# Patient Record
Sex: Male | Born: 1950 | Hispanic: No | Smoking: Never smoker
Health system: Southern US, Community
[De-identification: ages and names within clinical notes are randomized; demographics above are authoritative.]

## PROBLEM LIST (undated history)

## (undated) DIAGNOSIS — G5600 Carpal tunnel syndrome, unspecified upper limb: Secondary | ICD-10-CM

## (undated) DIAGNOSIS — R410 Disorientation, unspecified: Secondary | ICD-10-CM

## (undated) DIAGNOSIS — G35 Multiple sclerosis: Secondary | ICD-10-CM

## (undated) DIAGNOSIS — S24103A Unspecified injury at T7-T10 level of thoracic spinal cord, initial encounter: Secondary | ICD-10-CM

## (undated) DIAGNOSIS — D72819 Decreased white blood cell count, unspecified: Secondary | ICD-10-CM

## (undated) DIAGNOSIS — R000 Tachycardia, unspecified: Secondary | ICD-10-CM

## (undated) DIAGNOSIS — N200 Calculus of kidney: Secondary | ICD-10-CM

## (undated) DIAGNOSIS — I10 Essential (primary) hypertension: Secondary | ICD-10-CM

## (undated) DIAGNOSIS — E7800 Pure hypercholesterolemia, unspecified: Secondary | ICD-10-CM

## (undated) DIAGNOSIS — R269 Unspecified abnormalities of gait and mobility: Secondary | ICD-10-CM

## (undated) DIAGNOSIS — R41841 Cognitive communication deficit: Secondary | ICD-10-CM

## (undated) HISTORY — PX: LUMBAR LAMINECTOMY: SHX95

## (undated) HISTORY — PX: BACK SURGERY: SHX140

## (undated) HISTORY — DX: Cognitive communication deficit: R41.841

## (undated) HISTORY — DX: Tachycardia, unspecified: R00.0

## (undated) HISTORY — PX: COLONOSCOPY: SHX174

## (undated) HISTORY — DX: Carpal tunnel syndrome, unspecified upper limb: G56.00

## (undated) HISTORY — DX: Decreased white blood cell count, unspecified: D72.819

## (undated) HISTORY — DX: Pure hypercholesterolemia, unspecified: E78.00

## (undated) HISTORY — DX: Unspecified injury at T7-t10 level of thoracic spinal cord, initial encounter: S24.103A

---

## 1999-09-19 ENCOUNTER — Observation Stay (HOSPITAL_COMMUNITY): Admission: RE | Admit: 1999-09-19 | Discharge: 1999-09-20 | Payer: Self-pay | Admitting: Pediatrics

## 1999-09-19 ENCOUNTER — Encounter: Payer: Self-pay | Admitting: Pediatrics

## 1999-10-31 ENCOUNTER — Ambulatory Visit (HOSPITAL_COMMUNITY): Admission: RE | Admit: 1999-10-31 | Discharge: 1999-10-31 | Payer: Self-pay | Admitting: Pediatrics

## 1999-10-31 ENCOUNTER — Encounter: Payer: Self-pay | Admitting: Pediatrics

## 1999-12-15 ENCOUNTER — Encounter: Admission: RE | Admit: 1999-12-15 | Discharge: 1999-12-31 | Payer: Self-pay | Admitting: *Deleted

## 2000-07-14 ENCOUNTER — Emergency Department (HOSPITAL_COMMUNITY): Admission: EM | Admit: 2000-07-14 | Discharge: 2000-07-14 | Payer: Self-pay | Admitting: Emergency Medicine

## 2000-07-21 ENCOUNTER — Encounter: Admission: RE | Admit: 2000-07-21 | Discharge: 2000-10-19 | Payer: Self-pay | Admitting: *Deleted

## 2001-03-08 ENCOUNTER — Encounter: Admission: RE | Admit: 2001-03-08 | Discharge: 2001-06-06 | Payer: Self-pay | Admitting: Psychiatry

## 2001-08-22 ENCOUNTER — Encounter: Admission: RE | Admit: 2001-08-22 | Discharge: 2001-11-20 | Payer: Self-pay | Admitting: Psychiatry

## 2003-01-18 ENCOUNTER — Ambulatory Visit (HOSPITAL_COMMUNITY): Admission: RE | Admit: 2003-01-18 | Discharge: 2003-01-18 | Payer: Self-pay | Admitting: Psychiatry

## 2003-01-18 ENCOUNTER — Encounter: Payer: Self-pay | Admitting: Psychiatry

## 2003-04-05 ENCOUNTER — Encounter: Admission: RE | Admit: 2003-04-05 | Discharge: 2003-04-05 | Payer: Self-pay | Admitting: *Deleted

## 2004-05-12 ENCOUNTER — Encounter: Admission: RE | Admit: 2004-05-12 | Discharge: 2004-08-10 | Payer: Self-pay | Admitting: Neurology

## 2007-01-14 ENCOUNTER — Encounter: Admission: RE | Admit: 2007-01-14 | Discharge: 2007-01-14 | Payer: Self-pay | Admitting: Family Medicine

## 2010-06-13 ENCOUNTER — Encounter
Admission: RE | Admit: 2010-06-13 | Discharge: 2010-07-01 | Payer: Self-pay | Source: Home / Self Care | Attending: Family Medicine | Admitting: Family Medicine

## 2010-07-03 ENCOUNTER — Ambulatory Visit: Payer: Medicare Other | Attending: Family Medicine | Admitting: Rehabilitative and Restorative Service Providers"

## 2010-07-03 DIAGNOSIS — R269 Unspecified abnormalities of gait and mobility: Secondary | ICD-10-CM | POA: Insufficient documentation

## 2010-07-03 DIAGNOSIS — M2420 Disorder of ligament, unspecified site: Secondary | ICD-10-CM | POA: Insufficient documentation

## 2010-07-03 DIAGNOSIS — M629 Disorder of muscle, unspecified: Secondary | ICD-10-CM | POA: Insufficient documentation

## 2010-07-03 DIAGNOSIS — IMO0001 Reserved for inherently not codable concepts without codable children: Secondary | ICD-10-CM | POA: Insufficient documentation

## 2010-07-03 DIAGNOSIS — M6281 Muscle weakness (generalized): Secondary | ICD-10-CM | POA: Insufficient documentation

## 2010-07-07 ENCOUNTER — Ambulatory Visit: Payer: Medicare Other | Admitting: Rehabilitative and Restorative Service Providers"

## 2010-07-10 ENCOUNTER — Ambulatory Visit: Payer: Medicare Other | Admitting: Rehabilitative and Restorative Service Providers"

## 2010-07-14 ENCOUNTER — Ambulatory Visit: Payer: Medicare Other | Admitting: Rehabilitative and Restorative Service Providers"

## 2010-07-17 ENCOUNTER — Ambulatory Visit: Payer: Medicare Other | Admitting: Rehabilitative and Restorative Service Providers"

## 2010-07-22 ENCOUNTER — Ambulatory Visit: Payer: Medicare Other | Admitting: Rehabilitative and Restorative Service Providers"

## 2010-07-25 ENCOUNTER — Ambulatory Visit: Payer: Medicare Other | Admitting: Rehabilitative and Restorative Service Providers"

## 2010-07-31 ENCOUNTER — Ambulatory Visit: Payer: Medicare Other | Attending: Family Medicine | Admitting: Rehabilitative and Restorative Service Providers"

## 2010-07-31 DIAGNOSIS — IMO0001 Reserved for inherently not codable concepts without codable children: Secondary | ICD-10-CM | POA: Insufficient documentation

## 2010-07-31 DIAGNOSIS — M6281 Muscle weakness (generalized): Secondary | ICD-10-CM | POA: Insufficient documentation

## 2010-07-31 DIAGNOSIS — M2420 Disorder of ligament, unspecified site: Secondary | ICD-10-CM | POA: Insufficient documentation

## 2010-07-31 DIAGNOSIS — R269 Unspecified abnormalities of gait and mobility: Secondary | ICD-10-CM | POA: Insufficient documentation

## 2010-07-31 DIAGNOSIS — M629 Disorder of muscle, unspecified: Secondary | ICD-10-CM | POA: Insufficient documentation

## 2010-08-26 ENCOUNTER — Ambulatory Visit: Payer: Medicare Other | Admitting: Occupational Therapy

## 2010-08-28 ENCOUNTER — Encounter: Payer: Medicare Other | Admitting: Occupational Therapy

## 2010-08-28 ENCOUNTER — Ambulatory Visit: Payer: Medicare Other | Admitting: Rehabilitative and Restorative Service Providers"

## 2010-10-17 NOTE — H&P (Signed)
[place]. [facility]  Patient:    Larry [name], Larry [name]                      MRN: [id number] Adm. Date:  [id number] Attending:  Mick Sell CC:         Ammie Dalton, M.D.                         History and Physical  DATE OF BIRTH:  Jan 20, 1951.  CHIEF COMPLAINT:  Spastic paraparesis.  HISTORY OF PRESENT CONDITION:  Patient is a 60 year old married African-American gentleman with a diagnosis of multiple sclerosis made in [place] in 1996.   I do not have all the information associated with that.  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  In the aftermath of that, there was significant edema of the cord  which was present at least two days later on serial MRI scans.  MRI scans in 1996 showed evidence of high white matter signal in the frontal regions that was consistent with his paraparesis.  Patient also had a visual evoked potential, July 23, 1995, showing abnormal filling in the anterior pathway n the left side.  Patient has had problems with a neurogenic bladder.  We were unaware of any significant change in his eye, despite visual evoked potential study.  Patient is experiencing progressive difficulty with his gait, stiffness and weakness, beginning in December of 2000.  He has also complained of low back pain and in particularly, pain in his left hip.  He has difficulty lying on that side. The patient has not had incontinence.  He does have erectile dysfunction.  PAST SURGICAL HISTORY:  Lumbar laminectomy, as mentioned above.  CURRENT MEDICATIONS: 1. Avonex 30 mcg IM once per week. 2. Neurontin 400 mg p.o. t.i.d. 3. Baclofen 20 mg in the morning, afternoon and evening; 40 mg at nighttime. 4. Ultram 50 mg two in the morning. 5. Cartia XT 240 mg in the morning. 6. Amitriptyline 25 mg q.h.s.  ALLERGIES:  None known.  REVIEW OF SYSTEMS:   Positive for pain in his hips, numbness in his legs, occasional numbness in the right hand, occasional headaches.  No visual or hearing changes. No swallowing or speech difficulty.  No weakness in his upper extremities.  No intercurrent infections in the head and neck, lungs, GI or GU.  No rash, anemia, bruisability, lymphadenopathy.  Positive for diabetes.  Negative for thyroid disease.  Positive for hypertension.  Review of systems is otherwise negative.  SOCIAL HISTORY:  The patient is married.  He does not use tobacco or alcohol products.  PHYSICAL EXAMINATION:  GENERAL:  Pleasant 60 year old African-American gentleman in no apparent distress.  VITAL SIGNS:  Blood pressure 160/100, resting pulse 90, respirations 18, temperature 97.4.  O2 saturation 100%.  HEENT:  He has wax in the right ear; normal left ear.  No signs of infection in the oropharynx.  NECK:  Supple neck.  Full range of motion.  No cranial or cervical bruits.  LUNGS:  Clear.  HEART:  No murmurs.  ABDOMEN:  Soft.  Bowel sounds normal.  EXTREMITIES:  No edema.  Some spasticity in his legs and knees and tight heel cords.  NEUROLOGIC:  Mental status:  Patient was awake, alert and appropriate.  No dysphagia or dyspraxia.  Cranial nerve examination:  Round, reactive pupils. Fundi normal.  Visual fields full to  double simultaneous stimuli, okay and responses equal.  Symmetric facial strength.  Midline tongue and uvula.  Motor examination: Normal strength in the upper extremities.  Normal tone.  Good fine motor movements. Strength 4+/5 in his knees flexors, 5/5 in his knee extensors and foot plantarflexors, 2/5 dorsiflexors of his ankles and can barely wiggle his toes.   Sensation shows no level.  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 Cerebellar:  He has normal function in his upper extremities and spasticity prevents good movements in the lower extremities.  Gait:  Diplegic,  spastic, broad-based shuffling.  Deep tendon reflexes brisk, without clonus, greater in he lower extremities than in the upper extremities.  He has bilateral extensor plantar responses.  IMPRESSION: 1. 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  Patient carries a diagnosis of multiple    sclerosis but he has also had evidence of cord edema from critical thoracic    spinal stenosis.  I am not certain that I know the etiology of his dysfunction. 2. Hypertension without congestive heart failure, 404.10. 3. Non-insulin-dependent diabetes mellitus.  PLAN:  Patient is admitted for intrathecal baclofen trial; we will also evaluate the information sent to Dr. Ammie Dalton by [facility] and determine whether or not further workup is indicated and if a definite diagnosis of MS can be made.  The presence or absence of MS would not necessarily effect the use of the baclofen pump.  I have spoken with the patient and his wife at length to describe the means by which the pump would be placed, the potential mechanical problems with the pump and tubing, the need for a period of lack of activity for six weeks after we place the pump and the need for physical therapy, once we have demonstrated improvement in spasticity with the pump when the patient is more mobile and the unit is well-seated in place.  I have also described the possibilities of bowel and bladder dysfunction and in  particular erectile dysfunction.  The family voices understanding of these.  On completion of the baclofen trial, the patient will be discharged tomorrow and plan for neurosurgical evaluation will be made.  I suspect that we will be performing an MR of the back in order to be certain that there is adequate space in order to place the catheter.  The presence of previous back surgery makes this somewhat more problematic, although I do not think that it would prevent [place] from using the pump.  We may not be able to  place it as high as possible because of the previous stenosis at the mid-thoracic level.  Given the   patient is having significant problems in his legs and not his arms, this may be fine.  We will first wait to see if he responds to baclofen and to what degree e responds.  Because of the difficulty of the spinal procedure, it is my hope that we are not going to have to repeat this procedure a second time but protocol would  dictate that that should be done. DD:  09/19/99 TD:  09/19/99 Job: [id number]

## 2010-10-17 NOTE — Procedures (Signed)
[place]. [facility]  Patient:    Larry [name], Larry [name]                      MRN: [id number] Proc. Date: 09/19/99 Adm. Date:  [id number] Attending:  Mick Sell CC:         Ammie Dalton, M.D.                           Procedure Report  DATE OF BIRTH:  08-22-1950.  INDICATION:  Three attempts were made to perform lumbar puncture at bedside on r. Fischler-one in the lateral recumbent, two with him sitting up all at different levels.  We were unable to obtain spinal fluid.  DESCRIPTION OF PROCEDURE:  We brought him down to the fluoroscopy suite and Dr.  Arlice Colt placed the needle at what I believe is L2/3.  Then 10 cc of clear, colorless fluid was obtained from his spinal fluid and sent to the laboratory for studies.  Then 50 mcg of baclofen was instilled and mixed on five occasions by barbotage.  In the aftermath, the patient complained of some tingling in his hands with some headache.  He will be evaluated over the next eight hours to determine whether r not there is response to intrathecal baclofen in terms of diminishing his spasticity.  His headache will be treated with Tylenol.  We will keep him on bedrest until he has to be up in part because of his headache. DD:  09/19/99 TD:  09/20/99 Job: [id number]

## 2010-10-17 NOTE — Discharge Summary (Signed)
[place]. [facility]  Patient:    Larry [name], Larry [name]                      MRN: [id number] Adm. Date:  [id number] Disc. Date: 09/20/99 Attending:  Mick Sell CC:         Ammie Dalton, M.D.                           Discharge Summary  FINAL DIAGNOSES: 1. 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 2. Possible multiple sclerosis, 340. 3. Hypertension without congestive heart failure, 404.10. 4. Non-insulin-dependent diabetes mellitus without complications.  HOSPITAL COURSE:  The patient was admitted for intrathecal baclofen trial. I had difficulty performing lumbar puncture on the patient despite sitting him up. This was done under fluoroscopy, and there was some difficulty there as well. Fluid did drain but very very slowing.  There was 10 cc of clear, colorless fluid obtained. Protein 26, glucose 87, 4 white blood cells, rare lymphs were seen.  IgG albumin ratio and oligoclonal banding is pending.  There was 50 mcg of baclofen instilled, and Ashworth scores were conducted at 8:15 in the morning, 11:30, 12:30, 2:30, 4:30, and 6:40.  The patient had no evidence of spasticity in the upper extremities. In the lower extremities, he had significant spasticity, right leg more so than left, knee and ankle greater than all others.  At baseline, the patient had moderate spasticity, hip abduction and adduction marked hip flexion and knee flexion, knee extension, ankle dorsiflexion, and moderate plantar flexion. On the right side, there was marked hip adduction, abduction, flexion, and knee flexion, and severe knee extension, ankle dorsiflexion, and plantar flexion. In all cases, there was some ability to move his limbs. He did not have absolute rigidity to movement.  At four hours after the bolus which is the maximum effect of the medication, there was no increase in tone in the left lower extremity with the exception of ankle  dorsiflexion which showed slight increase in tone. The average Ashworth score dropped from 2.57 in the lower extremity to 1.4. On the right lower extremity, there was mild spasticity in hip abduction, flexion, knee extension, knee flexion, ankle dorsiflexion, and normal tone in the hip adduction and plantar flexion. Ashworth scores dropped from 3.43 down to 1.71. Six hours after, the scores began to climb slightly again more so on the right side than on the left. By eight hours, the patient was slightly better in both the right and left leg than he had been at baseline but was very close with Ashworth score 2.14 in the left leg and 3.0 in the right. In addition, the patient seemed to have his greatest amount of spasms two hours into the trial. Right leg was always more effective than the left leg. He had significant improvement in his pain in his hip. Overall, he tolerated this very well.  The physical therapists comments were that the patient must make conscious effort but only achieves minimal flexion and complains of increased pain in his hips at baseline. He had hip pain with flexion lying or standing. He reports that spasticity hinders his ADL performance. Two hours after the bolus, he had increased spasms in the right lower extremity where the quads were visibly contracting making the right knee more difficult to move presenting the appearance of increased tone. Four hours after the bolus, the patient was able to ambulate with markedly decreased stiffness  in his legs and the ability to achieve some hip and knee flexion and occasionally heel contact, greater on the left with conscious effort. At the peak time, left lower extremity tone was basically normal. Right lower extremity tone was mildly increased. Both markedly decreased from pre-bolus assessment.  IMPRESSION:  This was a positive test demonstrating response to intrathecal baclofen.  DISPOSITION:  At this time, the patient  will be discharged home. He is back to his baseline. We have restarted his home medications. We will research his evaluation at [facility] and decide whether he has a thoracic myelopathy from spondylosis, multiple sclerosis, or both problems. The patient will then be presented with options for further treatment. One concern that I have is that there was narrowing in the T9 to T12 range. This has been decompressed, but we will be needing to place a catheter up in this region which could be problematic for the surgeon. This will need to be carefully explored.  I also explained to the patient the complications of malfunction of the system which would cause instant return of his spasticity, problems with constipation, and possibly urinary difficulties, as well as sexual dysfunction. His wife expressed the opinion that she would rather have him walking than worry as much about the sexual dysfunction.  I appreciate the opportunity to see this patient. He is discharged in improved condition. We will follow up as noted above. I have discussed this case with D. Catalina Antigua, M.D. and also with Ammie Dalton, M.D., and he will be seen in follow-up after we have had an opportunity to review his old records. An MRI scan of this head and back may be necessary in addition to the spinal fluid obtained.DD:  09/20/99 TD:  09/21/99 Job: [id number]

## 2011-02-15 ENCOUNTER — Emergency Department (HOSPITAL_COMMUNITY): Payer: Medicare Other

## 2011-02-15 ENCOUNTER — Inpatient Hospital Stay (HOSPITAL_COMMUNITY)
Admission: EM | Admit: 2011-02-15 | Discharge: 2011-02-18 | DRG: 558 | Disposition: A | Discharge status: Home Health | Payer: Medicare Other | Attending: Internal Medicine | Admitting: Internal Medicine

## 2011-02-15 DIAGNOSIS — Z79899 Other long term (current) drug therapy: Secondary | ICD-10-CM

## 2011-02-15 DIAGNOSIS — E876 Hypokalemia: Secondary | ICD-10-CM | POA: Diagnosis present

## 2011-02-15 DIAGNOSIS — G35 Multiple sclerosis: Secondary | ICD-10-CM | POA: Diagnosis present

## 2011-02-15 DIAGNOSIS — E119 Type 2 diabetes mellitus without complications: Secondary | ICD-10-CM | POA: Diagnosis present

## 2011-02-15 DIAGNOSIS — R404 Transient alteration of awareness: Secondary | ICD-10-CM | POA: Diagnosis present

## 2011-02-15 DIAGNOSIS — S42409A Unspecified fracture of lower end of unspecified humerus, initial encounter for closed fracture: Secondary | ICD-10-CM | POA: Diagnosis present

## 2011-02-15 DIAGNOSIS — W19XXXA Unspecified fall, initial encounter: Secondary | ICD-10-CM | POA: Diagnosis present

## 2011-02-15 DIAGNOSIS — Z23 Encounter for immunization: Secondary | ICD-10-CM

## 2011-02-15 DIAGNOSIS — S0003XA Contusion of scalp, initial encounter: Secondary | ICD-10-CM | POA: Diagnosis present

## 2011-02-15 DIAGNOSIS — G8220 Paraplegia, unspecified: Secondary | ICD-10-CM | POA: Diagnosis present

## 2011-02-15 DIAGNOSIS — I10 Essential (primary) hypertension: Secondary | ICD-10-CM | POA: Diagnosis present

## 2011-02-15 DIAGNOSIS — N329 Bladder disorder, unspecified: Secondary | ICD-10-CM | POA: Diagnosis present

## 2011-02-15 DIAGNOSIS — M6282 Rhabdomyolysis: Principal | ICD-10-CM | POA: Diagnosis present

## 2011-02-15 LAB — CK TOTAL AND CKMB (NOT AT ARMC)
Relative Index: 1 (ref 0.0–2.5)
Total CK: 3190 U/L — ABNORMAL HIGH (ref 7–232)

## 2011-02-15 LAB — COMPREHENSIVE METABOLIC PANEL
AST: 70 U/L — ABNORMAL HIGH (ref 0–37)
Albumin: 4.9 g/dL (ref 3.5–5.2)
Chloride: 96 mEq/L (ref 96–112)
Creatinine, Ser: 0.86 mg/dL (ref 0.50–1.35)
Potassium: 3.2 mEq/L — ABNORMAL LOW (ref 3.5–5.1)
Total Bilirubin: 0.4 mg/dL (ref 0.3–1.2)
Total Protein: 7.6 g/dL (ref 6.0–8.3)

## 2011-02-15 LAB — URINALYSIS, ROUTINE W REFLEX MICROSCOPIC
Leukocytes, UA: NEGATIVE
Nitrite: NEGATIVE
Specific Gravity, Urine: 1.022 (ref 1.005–1.030)
Urobilinogen, UA: 0.2 mg/dL (ref 0.0–1.0)
pH: 5 (ref 5.0–8.0)

## 2011-02-15 LAB — CBC
HCT: 38.1 % — ABNORMAL LOW (ref 39.0–52.0)
MCH: 30.5 pg (ref 26.0–34.0)
MCV: 89.4 fL (ref 78.0–100.0)
RDW: 13.8 % (ref 11.5–15.5)
WBC: 9.5 10*3/uL (ref 4.0–10.5)

## 2011-02-15 LAB — GLUCOSE, CAPILLARY: Glucose-Capillary: 118 mg/dL — ABNORMAL HIGH (ref 70–99)

## 2011-02-15 LAB — DIFFERENTIAL
Eosinophils Relative: 0 % (ref 0–5)
Lymphocytes Relative: 5 % — ABNORMAL LOW (ref 12–46)
Lymphs Abs: 0.5 10*3/uL — ABNORMAL LOW (ref 0.7–4.0)
Monocytes Absolute: 0.4 10*3/uL (ref 0.1–1.0)
Monocytes Relative: 4 % (ref 3–12)

## 2011-02-15 LAB — ETHANOL: Alcohol, Ethyl (B): <11 mg/dL (ref 0–11)

## 2011-02-15 LAB — POCT I-STAT TROPONIN I: Troponin i, poc: 0.08 ng/mL (ref 0.00–0.08)

## 2011-02-15 LAB — TROPONIN I: Troponin I: <0.3 ng/mL (ref <0.30)

## 2011-02-15 LAB — URINE MICROSCOPIC-ADD ON

## 2011-02-16 ENCOUNTER — Other Ambulatory Visit (HOSPITAL_COMMUNITY): Payer: Medicare Other

## 2011-02-16 ENCOUNTER — Inpatient Hospital Stay (HOSPITAL_COMMUNITY): Payer: Medicare Other

## 2011-02-16 DIAGNOSIS — I517 Cardiomegaly: Secondary | ICD-10-CM

## 2011-02-16 DIAGNOSIS — R7989 Other specified abnormal findings of blood chemistry: Secondary | ICD-10-CM

## 2011-02-16 LAB — LIPID PANEL
HDL: 55 mg/dL (ref >39)
LDL Cholesterol: 83 mg/dL (ref 0–99)

## 2011-02-16 LAB — CARDIAC PANEL(CRET KIN+CKTOT+MB+TROPI)
CK, MB: 33.4 ng/mL (ref 0.3–4.0)
CK, MB: 17.8 ng/mL (ref 0.3–4.0)
Relative Index: 0.6 (ref 0.0–2.5)
Troponin I: 0.31 ng/mL (ref <0.30)
Troponin I: <0.3 ng/mL (ref <0.30)

## 2011-02-16 LAB — COMPREHENSIVE METABOLIC PANEL
ALT: 23 U/L (ref 0–53)
AST: 99 U/L — ABNORMAL HIGH (ref 0–37)
Albumin: 4.4 g/dL (ref 3.5–5.2)
Alkaline Phosphatase: 76 U/L (ref 39–117)
Calcium: 9.7 mg/dL (ref 8.4–10.5)
Glucose, Bld: 132 mg/dL — ABNORMAL HIGH (ref 70–99)
Potassium: 3.1 mEq/L — ABNORMAL LOW (ref 3.5–5.1)
Sodium: 140 mEq/L (ref 135–145)
Total Protein: 7 g/dL (ref 6.0–8.3)

## 2011-02-16 LAB — GLUCOSE, CAPILLARY
Glucose-Capillary: 128 mg/dL — ABNORMAL HIGH (ref 70–99)
Glucose-Capillary: 144 mg/dL — ABNORMAL HIGH (ref 70–99)
Glucose-Capillary: 149 mg/dL — ABNORMAL HIGH (ref 70–99)
Glucose-Capillary: 178 mg/dL — ABNORMAL HIGH (ref 70–99)

## 2011-02-16 LAB — CBC
Hemoglobin: 12.6 g/dL — ABNORMAL LOW (ref 13.0–17.0)
MCH: 31 pg (ref 26.0–34.0)
MCHC: 34.7 g/dL (ref 30.0–36.0)
Platelets: 327 10*3/uL (ref 150–400)
RDW: 13.7 % (ref 11.5–15.5)

## 2011-02-16 LAB — APTT: aPTT: 31 seconds (ref 24–37)

## 2011-02-16 LAB — PROTIME-INR
INR: 1.01 (ref 0.00–1.49)
Prothrombin Time: 13.5 seconds (ref 11.6–15.2)

## 2011-02-17 ENCOUNTER — Inpatient Hospital Stay (HOSPITAL_COMMUNITY): Payer: Medicare Other

## 2011-02-17 LAB — GLUCOSE, CAPILLARY

## 2011-02-17 LAB — CBC
Hemoglobin: 11.4 g/dL — ABNORMAL LOW (ref 13.0–17.0)
MCH: 30.6 pg (ref 26.0–34.0)
MCV: 92.2 fL (ref 78.0–100.0)
RBC: 3.72 MIL/uL — ABNORMAL LOW (ref 4.22–5.81)

## 2011-02-17 LAB — BASIC METABOLIC PANEL
CO2: 26 mEq/L (ref 19–32)
Calcium: 8.5 mg/dL (ref 8.4–10.5)
Creatinine, Ser: 0.76 mg/dL (ref 0.50–1.35)
Glucose, Bld: 109 mg/dL — ABNORMAL HIGH (ref 70–99)

## 2011-02-17 NOTE — Procedures (Signed)
REFERRING PHYSICIAN:  Eduard Clos, MD  HISTORY:  A 60 year old male with confusion and history of stroke.  MEDICATIONS:  Insulin, potassium, Ultram, Zanaflex, Neurontin, Lioresal, Enablex, Elavil, Zofran and acetaminophen.  CONDITIONS OF RECORDING:  This is a 16-channel EEG carried out with the patient in the awake state.  DESCRIPTION:  The waking background activity consists of a low-voltage symmetrical fairly well-organized 10 Hz alpha activity seen from the parieto-occipital and posterotemporal regions.  Low-voltage fast activity poorly organized was seen and during at times superimposed on more posterior rhythms.  A mixture of theta and alpha rhythm was seen from the central and temporal regions.  The patient does not drowse asleep.  Hypoventilation and intermittent photic stimulation were both performed, both failed to elicit any change in the tracing.  IMPRESSION:  This is a normal awake EEG.  COMMENT:  An EEG, the patient is sleep deprived to elicit drowse and light sleep, maybe desirable to further elicit possible seizure disorder.          ______________________________ Thana Farr, MD    [id number] D:  02/17/2011 18:30:20  T:  02/17/2011 23:22:46  Job #:  [id number]

## 2011-02-18 LAB — BASIC METABOLIC PANEL
BUN: 13 mg/dL (ref 6–23)
Creatinine, Ser: 0.83 mg/dL (ref 0.50–1.35)
GFR calc Af Amer: >60 mL/min (ref >60)
GFR calc non Af Amer: >60 mL/min (ref >60)

## 2011-02-18 LAB — GLUCOSE, CAPILLARY

## 2011-02-19 NOTE — Consult Note (Addendum)
Larry [name], KLIMOWICZ NO.:  [id number]  MEDICAL RECORD NO.:  [id number]  LOCATION:  2021                         FACILITY:  [facility]  PHYSICIAN:  Peter M. Swaziland, M.D.  DATE OF BIRTH:  1950-10-25  DATE OF CONSULTATION:  02/16/2011 DATE OF DISCHARGE:                                CONSULTATION   PRIMARY CARDIOLOGIST:  New.  PRIMARY MEDICAL DOCTOR:  Betti D. Pecola Leisure, MD  CHIEF COMPLAINT:  Fall.  REASON FOR CONSULTATION:  Abnormal cardiac enzymes.  HISTORY OF PRESENT ILLNESS:  Mr. Hillhouse is a 60 year old gentleman with no prior cardiac history, but history of multiple sclerosis and diabetes mellitus who apparently sustained a fall last night.  Mr. Blessinger is not entirely clear of what happened during that episode.  He is lucid and coherent now, but only remember bits and pieces of the events of yesterday.  He thinks he may have been lying in bed when the phone rang, but was unable to get out of bed to answer.  He attempted to crawl downstairs and the next thing he knew EMS had arrived.  He wears at Life Alert button, but does not remember pressing it and questions whether or not he accidently laid on it.  Nevertheless, he was subsequently transferred to [facility] especially in the light of the fact that he was somewhat confused.  CT of the head demonstrated no acute intracranial abnormalities.  Did show left forehead and scalp hematoma and plain films of the elbow demonstrated a nondisplaced transcondylar distal humeral fracture with a probable avulsion-type ulnar fracture. His CKs have been elevated from [phone] with MBs of 32.7 and 33.4 with negative relative indices.  First troponin was negative and next was 0.31, thus prompting cardiology consultation.  EKG demonstrates normal sinus rhythm, frequent PVCs, and occasional PAC.  Telemetry reveals frequent PVCs.  The patient is asymptomatic from a cardiac standpoint and denies any chest pain,  shortness of breath, palpitations, or known syncope.  He is currently receiving normal saline and IV fluid, and is scheduled for an EEG to rule out seizure as an etiology.  PAST MEDICAL HISTORY: 1. Multiple sclerosis with spastic paraparesis. 2. Diabetes mellitus type 2. 3. Hypertension. 4. Status post lumbar laminectomy.  No prior cardiac workup or evaluation.  OUTPATIENT MEDICATIONS:  The full list is pending as this daughter plans to bring his medications this morning, but includes: 1. Gabapentin 800 mg b.i.d. 2. Amitriptyline 25 mg daily. 3. Metformin 1000 mg b.i.d. 4. Tizanidine 4 mg 2 tablets nightly. 5. Baclofen 20 mg b.i.d. 6. Solifenacin 10 mg daily. 7. Tramadol 50 mg 2 tablets nightly. 8. Avonex 30 mcg IM q.7 days. 9. Hydrochlorothiazide 25 mg daily. 10.Pioglitazone 30 mg daily.  INPATIENT MEDICATIONS:  Due to the pending med rec, only include sliding scale insulin and IV fluids at this time.  ALLERGIES:  No known drug allergies.  SOCIAL HISTORY:  Mr. Lacko lives alone in [place].  His daughter lives in [place].  He denies any tobacco, alcohol, or drug use.  FAMILY HISTORY:  Positive for diabetes, as well as liver cancer in his father.  REVIEW OF SYSTEMS:  No fevers, chills, nausea, vomiting, or diarrhea. Up  until today, the patient has felt well and even attended a church function on Saturday with no change from his baseline.  He ambulates with 2 canes.  LABORATORY DATA:  WBC 6.4, hemoglobin 12.6, hematocrit 36.3, and platelet count 327.  Sodium 140, potassium 3.1, chloride 99, CO2 of 29, glucose 132, BUN 19, and creatinine 0.80.  LFTs are normal with the exception of AST of 99.  CKs and MBs and troponin as noted above. Alcohol level less than 11.  UA showed 15 ketones, large blood, 100 protein.  STUDIES: 1. Right elbow plain film showed mild irregularity of the trochlea,     could represent normal variant and ossification, although history      of trauma means nondisplaced fracture is not excluded. 2. Elbow plain film on the left shows nondisplaced transcondylar     distal humeral fracture.  Probable avulsion-type ulnar fracture     fragments versus comminution noted posteriorly. 3. CT of the C-spine showed no static evidence of acute injuries to     the C-spine.  Multilevel degenerative changes with central spinal     and foraminal narrowing from C4-T1. 4. Chest x-ray on February 15, 2011, showed low lung volumes. 5. CT of the head on February 15, 2011, showed no acute intracranial     abnormality.  Mild chronic ischemic white matter disease.  Left     forehead and scalp subcutaneous hematoma.  PHYSICAL EXAMINATION:  VITAL SIGNS:  Temperature 98.7, pulse 95, respirations 18, blood pressure 94/59, and pulse ox 99% on room air. GENERAL:  This is a pleasant African American male in no acute distress. HEENT:  Normocephalic and atraumatic with extraocular muscles intact and clear sclerae.  Nares are without discharge. NECK:  Supple without carotid bruits or JVD. HEART:  Auscultation to the heart reveals slightly tachycardic rate, regular rhythm with occasional ectopy.  No obvious murmurs, rubs, or gallops. LUNGS:  Clear to auscultation bilaterally without wheezes, rales, or rhonchi. ABDOMEN:  Soft, nontender, and nondistended.  Positive bowel sounds. EXTREMITIES:  Warm and dry without edema.  He has 2+ pedal pulses bilaterally. NEUROLOGIC:  He is alert and oriented x3 and responds questions appropriately with a normal affect.  Does have bilateral decreased lower extremity strength.  ASSESSMENT/PLAN:  The patient was seen and examined by Dr. Swaziland and myself.  This is a 60 year old gentleman with no prior cardiac history, but a history of multiple sclerosis and diabetes who presents to [facility] with a possible fall last night, the details of which are unclear.  He has sustained a scalp hematoma and humeral  fracture as well as was found to have rhabdomyolysis as a result.  His first troponin was negative and second troponin was 0.31.  CKs and MBs are elevated, but negative relative indices are noted.  We did not feel that this represents acute coronary syndrome at this time.  We will continue to trend.  He does have frequent PVCs on telemetry, but is hypokalemic, so we will replete his potassium.  We will check a 2-D echocardiogram to evaluate his LV function.  If his echo is normal, no further cardiac workup is felt needed at this time.  Thank you for the opportunity to participate in the care of this interesting gentleman.     Ronie Spies, P.A.C.   ______________________________ Peter M. [place]aziland, M.D.    DD/MEDQ  D:  02/16/2011  T:  02/16/2011  Job:  [id number]  Electronically Signed by PETER Swaziland M.D. on 02/19/2011  05:08:10 PM Electronically Signed by Ronie Spies  on 02/26/2011 07:05:30 PM

## 2011-03-01 NOTE — H&P (Signed)
NAMESHAVON, ZENZ NO.:  [id number]  MEDICAL RECORD NO.:  [id number]  LOCATION:  2021                         FACILITY:  [facility]  PHYSICIAN:  Eduard Clos, MDDATE OF BIRTH:  [birth year]  DATE OF ADMISSION:  02/15/2011 DATE OF DISCHARGE:                             HISTORY & PHYSICAL   PRIMARY CARE PHYSICIAN:  Betti D. Pecola Leisure, MD, at [facility].  CHIEF COMPLAINT:  Fall.  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  He was found on the floor, he was trying to crawl to the door, and he also sustained a small hematoma on the left frontal head.  The patient states that he did not lose consciousness but he looked confused.  When he was brought in the ER, he was confused, getting better now.  The patient's daughter, who was at the bedside, also feels that he was much confused when he came.  The patient does have a spastic paraparesis, but he is able to move upper extremities.  He is talking but he is having some difficulty bringing things together.  Denies any chest pain, shortness of breath, nausea, vomiting, abdominal pain, dysuria, discharge, diarrhea.  Had sustained some bruises on both elbows and denies any headache at this time.  Has no visual symptoms.  Denies any difficulty speaking, though he had some difficulty bringing out the words.  Denied any difficulty swallowing.  The patient in the ER had CT head at this time which only shows superficial subcutaneous hematoma on the left frontal head and scalp. With the high CK levels, the patient will be admitted for further workup.  The patient denies any tongue bite or incontinence of urine or any seizure-like activities.  PAST MEDICAL HISTORY: 1. 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 2. History of diabetes mellitus, type 2.  PAST SURGICAL HISTORY:  Spine surgery.  MEDICATIONS PRIOR TO ADMISSION:  Gabapentin, metformin, tizanidine, and doses are to be verified.  ALLERGIES:  No known drug allergies.  FAMILY HISTORY:  Positive for liver cancer in his dad and diabetes.  SOCIAL HISTORY:  The patient lives alone.  Denies smoking cigarettes, drinking alcohol, or using illegal drugs.  He is separated at this time.  REVIEW OF SYSTEMS:  As per the history of presenting illness, nothing else significant.  PHYSICAL EXAMINATION:  VITAL SIGNS:  The patient was examined at bedside, not in acute distress. VITAL SIGNS:  Blood pressure 129/79, pulse is 96 per minute, temperature 99.6, respirations 18 per minute, O2 sat 99%. HEENT:  There is a smaller left periorbital hematoma and also around the frontal head area.  There is no facial asymmetry.  Tongue is midline. The patient is able to count in both eyes and no neck rigidity noted. No discharge from ears, eyes, nose, or mouth. CHEST:  Bilateral air entry present.  No rhonchi, no crepitation. HEART:  S1 and S2 heard. ABDOMEN:  Soft, nontender.  Bowel sounds heard. CENTRAL NERVOUS SYSTEM:  The patient is alert, awake.  At this time, he is oriented x3 but appears a little bit confused.  He is able to move both upper extremities.  He is unable to move his lower extremities as he is a known case of spastic paraparesis from his multiple sclerosis. He is able to wiggle his toes. EXTREMITIES:  There are some bruises on both elbows and also in the right knee area from the fall.  As explained, there is a small hematoma on the left frontal area of his head.  There is are no acute ischemic changes, cyanosis, or clubbing.  LABORATORY DATA:  EKG has been ordered. A CT head without contrast shows no acute intracranial abnormality, mild chronic ischemic white matter disease, left frontal  and scalp subcutaneous hematoma. Chest x-ray shows low-volume chest. CT C-spine without contrast shows no static evidence of acute injury to the cervical spine, multilevel degenerative changes with central spinal foraminal narrowing from C4 to T1. CBC:  WBCs 9.5, hemoglobin is 13, hematocrit is 38.1, platelets 305, and complete metabolic panel:  Sodium 138, potassium 3.2, chloride 96, carbon dioxide 27, glucose 132, BUN 25, creatinine 0.8, total bilirubin is 0.4, alkaline phosphatase 81, AST 70, ALT 23, total protein 7.6, albumin 4.9, calcium 10.3.  CK is 3190, MB is 32.7, relative index is 1, troponin is less than 0.3, troponin I is 0.08.  Alcohol level less than 11.  UA is negative for nitrite, leukocytes, and ketones, glucose negative, hyaline casts, wbc's 0, rbc's 3-6, large amount of blood.  ASSESSMENT: 1. Delirium. 2. Fall with rhabdomyolysis. 3. Small left frontal hematoma. 4. History of multiple sclerosis with spastic paraparesis. 5. History of diabetes mellitus, type 2. 6. Mild bruises on both elbows from fall.  PLAN: 1. We will admit the patient to telemetry. 2. For his delirium, at this time the patient is getting more well     oriented.  He was confused when he came.  His CK levels are high.     We do not know the exact cause for his delirium.  We will check a     urine drug screen.  The patient may be postictal, for which we are     going to get an EEG and at the same time, we will also get MRI of     the brain.  At the same time, we will also rule out any CVA. 3. Rhabdomyolysis.  We are going to hydrate the patient at this time     with normal saline at 150 mL/hour with some KCl added to the     fluids.  Recheck CK daily in a.m. until it is showing decreasing     trend. 4. We will get a chest x-ray, EKG, urine drug screen and we need to     verify his home medication doses and his medications. 5. Further recommendations as condition evolves.     Eduard Clos, MD     ANK/MEDQ  D:  02/15/2011  T:  02/16/2011  Job:  [id number]  cc:   Jocelyn Lamer D. Pecola Leisure, M.D.  Electronically Signed by Midge Minium MD on 03/01/2011 11:58:56 AM

## 2011-03-05 NOTE — Discharge Summary (Signed)
Larry [name], Larry [name] NO.:  [id number]  MEDICAL RECORD NO.:  [id number]  LOCATION:  2021                         FACILITY:  [facility]  PHYSICIAN:  Lonia Blood, M.D.       DATE OF BIRTH:  Mar 06, 1951  DATE OF ADMISSION:  02/15/2011 DATE OF DISCHARGE:  02/18/2011                              DISCHARGE SUMMARY   DISCHARGE DIAGNOSES: 1. Altered mental status of unclear etiology, resolved.  By the time     of discharge of this patient we could not figure out why he was     confused on admission. 2. Fall with mild rhabdomyolysis - resolved. 3. Multiple sclerosis. 4. Diabetes mellitus type 2.  DISCHARGE MEDICATIONS: 1. Amitriptyline 25 mg daily. 2. Avonex 300 mcg intramuscularly every week. 3. Baclofen 20 mg twice a day. 4. Gabapentin 800 mg twice a day. 5. Metformin 1000 mg twice a day. 6. Multivitamin daily. 7. Actos 30 mg daily. 8. Solifenacin 10 mg daily. 9. Tizanidine 4 mg 2 tablets at bedtime. 10.Tramadol 50 mg 2 tablets at bedtime.  CONDITION ON DISCHARGE:  Larry [name] is discharged in good condition, alert, oriented, no acute distress, able to ambulate around the room using his crutches.  The patient will follow up with his primary care physician Dr. Leilani Able.  PROCEDURE IN THIS ADMISSION: 1. The patient underwent head CT without contrast on February 15, 2011, that showed no acute intracranial abnormality. 2. Cervical spine CT which showed no static evidence of acute injury     of the cervical spine. 3. Right elbow x-ray showing irregularity of the trochlea of unclear     clinical significance. 4. Left elbow x-ray showing nondisplaced distal humeral fracture - the     patient was completely asymptomatic without any pain, full range of     motion - case was discussed with Dr. Shon Baton who agreed that nothing     has to be done unless the patient becomes symptomatic. 5. MRI of the brain which shows no evidence of acute ischemia,     nonspecific  subcortical white matter changes, probably represents     areas of clinically gliosis, chronic microvascular disease. 6. EEG February 17, 2011, showing normal awake EEG.  CONSULTATION THIS ADMISSION:  The patient was seen in consultation by Dr. Peter [place] from Cardiology.  HISTORY AND PHYSICAL:  Refer dictated H and P which was done by Dr. Toniann Fail.  HOSPITAL COURSE:  Larry [name] is a 60 year old gentleman with longstanding multiple sclerosis who was brought from home with confusion.  He also had a fall prior to admission.  He was found to have mild rhabdomyolysis with a total CK of 3190, AST of 70.  The patient's creatinine was 0.8, BUN 25.  For the patient's confused state, he underwent a full workup which included MRI of the brain, EEG, septic workup with urine culture, blood cultures.  None of the above tests revealed any etiology for the patient's altered mental status.  Alcohol level was undetectable and urine drug screen did not return any offenders.  By hospital day #2, Larry [name] was alert and oriented.  He was monitored for 48 more hours without any  signs of confusion.  He was resumed on all of his medications which by the way he has been taking for the past 10 years.  Overall it is unclear why the patient had alteration in sensorium prior to admission and since he remained stable for 72 hours without any return of his confusion.  We deemed that he is stable for discharge home.  Upon admission, the patient had x-rays of both elbows.  On the left elbow x-ray the interpretation of the radiologist that the patient may have a fracture.  Clinically, the patient had absolutely no pain in the elbow, had full range of motion. For this reason no further treatment is recommended unless he becomes symptomatic.     Lonia Blood, M.D.     SL/MEDQ  D:  02/20/2011  T:  02/20/2011  Job:  [id number]  cc:   Jocelyn Lamer D. Pecola Leisure, M.D.  Electronically Signed by Lonia Blood M.D. on  03/05/2011 07:36:19 AM

## 2011-09-07 ENCOUNTER — Encounter: Payer: Medicare Other | Admitting: Occupational Therapy

## 2011-09-07 ENCOUNTER — Ambulatory Visit: Payer: Medicare Other | Admitting: *Deleted

## 2011-09-16 ENCOUNTER — Ambulatory Visit: Payer: Medicare Other | Admitting: Physical Therapy

## 2011-09-16 ENCOUNTER — Encounter: Payer: Medicare Other | Admitting: Occupational Therapy

## 2011-12-10 ENCOUNTER — Emergency Department (HOSPITAL_BASED_OUTPATIENT_CLINIC_OR_DEPARTMENT_OTHER): Payer: Medicare Other

## 2011-12-10 ENCOUNTER — Inpatient Hospital Stay (HOSPITAL_BASED_OUTPATIENT_CLINIC_OR_DEPARTMENT_OTHER)
Admission: EM | Admit: 2011-12-10 | Discharge: 2011-12-12 | DRG: 551 | Disposition: A | Discharge status: Home / Self Care | Payer: Medicare Other | Attending: Internal Medicine | Admitting: Internal Medicine

## 2011-12-10 ENCOUNTER — Encounter (HOSPITAL_BASED_OUTPATIENT_CLINIC_OR_DEPARTMENT_OTHER): Payer: Self-pay | Admitting: *Deleted

## 2011-12-10 DIAGNOSIS — G35 Multiple sclerosis: Secondary | ICD-10-CM

## 2011-12-10 DIAGNOSIS — S24103A Unspecified injury at T7-T10 level of thoracic spinal cord, initial encounter: Secondary | ICD-10-CM

## 2011-12-10 DIAGNOSIS — G9340 Encephalopathy, unspecified: Secondary | ICD-10-CM | POA: Diagnosis present

## 2011-12-10 DIAGNOSIS — N3281 Overactive bladder: Secondary | ICD-10-CM | POA: Diagnosis present

## 2011-12-10 DIAGNOSIS — Z7982 Long term (current) use of aspirin: Secondary | ICD-10-CM

## 2011-12-10 DIAGNOSIS — Z87828 Personal history of other (healed) physical injury and trauma: Secondary | ICD-10-CM | POA: Diagnosis present

## 2011-12-10 DIAGNOSIS — R410 Disorientation, unspecified: Secondary | ICD-10-CM | POA: Diagnosis present

## 2011-12-10 DIAGNOSIS — R339 Retention of urine, unspecified: Secondary | ICD-10-CM

## 2011-12-10 DIAGNOSIS — M5104 Intervertebral disc disorders with myelopathy, thoracic region: Principal | ICD-10-CM | POA: Diagnosis present

## 2011-12-10 DIAGNOSIS — E1165 Type 2 diabetes mellitus with hyperglycemia: Secondary | ICD-10-CM

## 2011-12-10 DIAGNOSIS — G959 Disease of spinal cord, unspecified: Secondary | ICD-10-CM | POA: Diagnosis present

## 2011-12-10 DIAGNOSIS — I10 Essential (primary) hypertension: Secondary | ICD-10-CM | POA: Diagnosis present

## 2011-12-10 DIAGNOSIS — IMO0002 Reserved for concepts with insufficient information to code with codable children: Secondary | ICD-10-CM | POA: Diagnosis present

## 2011-12-10 DIAGNOSIS — E119 Type 2 diabetes mellitus without complications: Secondary | ICD-10-CM | POA: Diagnosis present

## 2011-12-10 DIAGNOSIS — G8220 Paraplegia, unspecified: Secondary | ICD-10-CM

## 2011-12-10 DIAGNOSIS — F0390 Unspecified dementia without behavioral disturbance: Secondary | ICD-10-CM | POA: Diagnosis present

## 2011-12-10 DIAGNOSIS — M4712 Other spondylosis with myelopathy, cervical region: Secondary | ICD-10-CM | POA: Diagnosis present

## 2011-12-10 DIAGNOSIS — R4182 Altered mental status, unspecified: Secondary | ICD-10-CM | POA: Diagnosis present

## 2011-12-10 DIAGNOSIS — G9589 Other specified diseases of spinal cord: Secondary | ICD-10-CM | POA: Diagnosis present

## 2011-12-10 HISTORY — DX: Multiple sclerosis: G35

## 2011-12-10 HISTORY — DX: Unspecified abnormalities of gait and mobility: R26.9

## 2011-12-10 HISTORY — DX: Disorientation, unspecified: R41.0

## 2011-12-10 HISTORY — DX: Essential (primary) hypertension: I10

## 2011-12-10 LAB — POCT I-STAT 3, ART BLOOD GAS (G3+)
Acid-Base Excess: 1 mmol/L (ref 0.0–2.0)
Bicarbonate: 27.9 mEq/L — ABNORMAL HIGH (ref 20.0–24.0)
O2 Saturation: 97 %
Patient temperature: 98.2
TCO2: 29 mmol/L (ref 0–100)
pCO2 arterial: 50.8 mmHg — ABNORMAL HIGH (ref 35.0–45.0)
pCO2 arterial: 35.9 mmHg (ref 35.0–45.0)
pH, Arterial: 7.497 — ABNORMAL HIGH (ref 7.350–7.450)

## 2011-12-10 LAB — CBC WITH DIFFERENTIAL/PLATELET
Basophils Relative: 0 % (ref 0–1)
HCT: 34 % — ABNORMAL LOW (ref 39.0–52.0)
Hemoglobin: 11.8 g/dL — ABNORMAL LOW (ref 13.0–17.0)
Lymphs Abs: 0.6 10*3/uL — ABNORMAL LOW (ref 0.7–4.0)
MCHC: 34.7 g/dL (ref 30.0–36.0)
Monocytes Absolute: 0.4 10*3/uL (ref 0.1–1.0)
Monocytes Relative: 8 % (ref 3–12)
Neutro Abs: 4 10*3/uL (ref 1.7–7.7)

## 2011-12-10 LAB — COMPREHENSIVE METABOLIC PANEL
Albumin: 4.6 g/dL (ref 3.5–5.2)
BUN: 33 mg/dL — ABNORMAL HIGH (ref 6–23)
CO2: 30 mEq/L (ref 19–32)
Chloride: 98 mEq/L (ref 96–112)
Creatinine, Ser: 1.8 mg/dL — ABNORMAL HIGH (ref 0.50–1.35)
GFR calc Af Amer: 44 mL/min — ABNORMAL LOW (ref >90)
GFR calc non Af Amer: 38 mL/min — ABNORMAL LOW (ref >90)
Glucose, Bld: 154 mg/dL — ABNORMAL HIGH (ref 70–99)
Total Bilirubin: 0.2 mg/dL — ABNORMAL LOW (ref 0.3–1.2)

## 2011-12-10 LAB — URINALYSIS, ROUTINE W REFLEX MICROSCOPIC
Ketones, ur: NEGATIVE mg/dL
Leukocytes, UA: NEGATIVE
Nitrite: NEGATIVE
Protein, ur: 30 mg/dL — AB
Urobilinogen, UA: 0.2 mg/dL (ref 0.0–1.0)

## 2011-12-10 LAB — URINE MICROSCOPIC-ADD ON

## 2011-12-10 LAB — GLUCOSE, CAPILLARY: Glucose-Capillary: 92 mg/dL (ref 70–99)

## 2011-12-10 MED ORDER — LIDOCAINE HCL 2 % EX GEL
Freq: Once | CUTANEOUS | Status: AC
  Administered 2011-12-10: 20 via URETHRAL
  Filled 2011-12-10: qty 20

## 2011-12-10 MED ORDER — SODIUM CHLORIDE 0.9 % IV SOLN
Freq: Once | INTRAVENOUS | Status: AC
  Administered 2011-12-10: 15:00:00 via INTRAVENOUS

## 2011-12-10 NOTE — ED Notes (Signed)
Report given to RN ceasar on 3700, informed that foley removed at 2230. Educated patient about issues with voiding and encouraged to leave foley in place. Pt request to have it removed. MD informed. RN ceasar on 3700 notified

## 2011-12-10 NOTE — ED Notes (Signed)
md alerted to critical mb of 12.5 called to me by sheri in the lab.

## 2011-12-10 NOTE — ED Notes (Signed)
Spoke to [place]-- via [facility]

## 2011-12-10 NOTE — ED Provider Notes (Signed)
History     CSN: [id number]  Arrival date & time 12/10/11  1257   First MD Initiated Contact with Patient 12/10/11 1320      Chief Complaint  Patient presents with  . Weakness    (Consider location/radiation/quality/duration/timing/severity/associated sxs/prior treatment) Patient is a 61 y.o. male presenting with weakness. The history is provided by the EMS personnel and a relative. No language interpreter was used.  Weakness The primary symptoms include altered mental status. The symptoms began yesterday. The episode lasted 2 days. The symptoms are unchanged. Focality: none. Context: unknown.  Additional symptoms include weakness. Associated medical issues comments: MS. Workup history includes MRI.  Pt's daughter reports pt did not answer her calls yesterday.  Daughter reports pt lives alone.  She reports when she got to pt's house he was very sleepy and talking incoherently.  She reports pt normally walks with a walker or with 2 canes.  She reports pt not able to ambulate alone today.  Past Medical History  Diagnosis Date  . MS (multiple sclerosis)   . Diabetes mellitus   . Hypertension     History reviewed. No pertinent past surgical history.  History reviewed. No pertinent family history.  History  Substance Use Topics  . Smoking status: Never Smoker   . Smokeless tobacco: Not on file  . Alcohol Use:       Review of Systems  Constitutional: Positive for activity change and fatigue.  Musculoskeletal: Negative for joint swelling.  Neurological: Positive for weakness.  Psychiatric/Behavioral: Positive for altered mental status.  All other systems reviewed and are negative.    Allergies  Review of patient's allergies indicates no known allergies.  Home Medications   Current Outpatient Rx  Name Route Sig Dispense Refill  . AMITRIPTYLINE HCL 25 MG PO TABS Oral Take 25 mg by mouth at bedtime.    . ASPIRIN 81 MG PO TABS Oral Take 81 mg by mouth daily.    Marland Kitchen  BACLOFEN 20 MG PO TABS Oral Take 20 mg by mouth 3 (three) times daily. And at bedtime as needed    . CARBAMAZEPINE 100 MG PO CHEW Oral Chew by mouth 2 (two) times daily.    Marland Kitchen VITAMIN D 1000 UNITS PO TABS Oral Take 1,000 Units by mouth daily.    Marland Kitchen GABAPENTIN 800 MG PO TABS Oral Take 800 mg by mouth 2 (two) times daily. And  2 tablets at bedtime    . LISINOPRIL 20 MG PO TABS Oral Take 20 mg by mouth daily.    Marland Kitchen METFORMIN HCL 1000 MG PO TABS Oral Take 1,000 mg by mouth 2 (two) times daily with a meal.    . ONE-DAILY MULTI VITAMINS PO TABS Oral Take 1 tablet by mouth daily.    Marland Kitchen PIOGLITAZONE HCL 30 MG PO TABS Oral Take 30 mg by mouth daily.    Marland Kitchen TIZANIDINE HCL 4 MG PO CAPS Oral Take 2-4 mg by mouth 2 (two) times daily as needed.    Marland Kitchen TRAMADOL HCL 50 MG PO TABS Oral Take 50 mg by mouth 4 (four) times daily.      BP 146/86  Pulse 87  Temp 97.6 F (36.4 C) (Oral)  Resp 20  Ht 5\' 7"  (1.702 m)  Wt 165 lb (74.844 kg)  BMI 25.84 kg/m2  SpO2 96%  Physical Exam  Nursing note and vitals reviewed. Constitutional: He appears well-developed and well-nourished.  HENT:  Head: Normocephalic and atraumatic.  Right Ear: External ear normal.  Left Ear:  External ear normal.  Nose: Nose normal.  Mouth/Throat: Oropharynx is clear and moist.  Eyes: Conjunctivae are normal. Pupils are equal, round, and reactive to light.  Neck: Normal range of motion. Neck supple.  Cardiovascular: Normal rate and normal heart sounds.   Pulmonary/Chest: Effort normal.  Abdominal: Soft.  Musculoskeletal: Normal range of motion.  Skin: Skin is warm.   Date: 12/10/2011  Rate: 86  Rhythm: normal sinus rhythm  QRS Axis: normal  Intervals: normal  ST/T Wave abnormalities: normal  Conduction Disutrbances:none  Narrative Interpretation:   Old EKG Reviewed: unchanged   ED Course  Procedures (including critical care time) Pt brought in by EMS.  Pt falls asleep and 02 sats decrease to 60's.   Pt awaken and 02 sats return  to 100%.  Pt placed on monitor and continuous pulse ox. Dr. Ranae Palms in to see and examine.  Ct head, Chest xray and labs obtained,  Pt has loud snoring and sleep apnea.  Pt has several episodes of decreased sats but rises when nurse wakes him up. Ct scan shows no acute abnormality,  Chest xray normal.  Pt denies taking to much medication. Old records reviewed. Labs return, Ck is elevated, Bun and Creat are slightly elevated,  Pt given IV fluids x 1 liter,  Daughter reports pt is talking more normally and seems less sleepy however pt still more confused than previously.    Labs Reviewed  CBC WITH DIFFERENTIAL - Abnormal; Notable for the following:    RBC 3.71 (*)     Hemoglobin 11.8 (*)     HCT 34.0 (*)     Neutrophils Relative 79 (*)     Lymphs Abs 0.6 (*)     All other components within normal limits  COMPREHENSIVE METABOLIC PANEL - Abnormal; Notable for the following:    Glucose, Bld 154 (*)     BUN 33 (*)     Creatinine, Ser 1.80 (*)     AST 38 (*)     Total Bilirubin 0.2 (*)     GFR calc non Af Amer 38 (*)     GFR calc Af Amer 44 (*)     All other components within normal limits  GLUCOSE, CAPILLARY - Abnormal; Notable for the following:    Glucose-Capillary 140 (*)     All other components within normal limits  CARDIAC PANEL(CRET KIN+CKTOT+MB+TROPI) - Abnormal; Notable for the following:    Total CK 632 (*)     CK, MB 12.5 (*)     All other components within normal limits  URINALYSIS, ROUTINE W REFLEX MICROSCOPIC   No results found.   No diagnosis found.  Results for orders placed during the hospital encounter of 12/10/11  CBC WITH DIFFERENTIAL      Component Value Range   WBC 5.1  4.0 - 10.5 K/uL   RBC 3.71 (*) 4.22 - 5.81 MIL/uL   Hemoglobin 11.8 (*) 13.0 - 17.0 g/dL   HCT 14.7 (*) 82.9 - 56.2 %   MCV 91.6  78.0 - 100.0 fL   MCH 31.8  26.0 - 34.0 pg   MCHC 34.7  30.0 - 36.0 g/dL   RDW 13.0  86.5 - 78.4 %   Platelets 257  150 - 400 K/uL   Neutrophils Relative 79 (*)  43 - 77 %   Neutro Abs 4.0  1.7 - 7.7 K/uL   Lymphocytes Relative 13  12 - 46 %   Lymphs Abs 0.6 (*) 0.7 - 4.0 K/uL  Monocytes Relative 8  3 - 12 %   Monocytes Absolute 0.4  0.1 - 1.0 K/uL   Eosinophils Relative 0  0 - 5 %   Eosinophils Absolute 0.0  0.0 - 0.7 K/uL   Basophils Relative 0  0 - 1 %   Basophils Absolute 0.0  0.0 - 0.1 K/uL  COMPREHENSIVE METABOLIC PANEL      Component Value Range   Sodium 140  135 - 145 mEq/L   Potassium 3.8  3.5 - 5.1 mEq/L   Chloride 98  96 - 112 mEq/L   CO2 30  19 - 32 mEq/L   Glucose, Bld 154 (*) 70 - 99 mg/dL   BUN 33 (*) 6 - 23 mg/dL   Creatinine, Ser 1.61 (*) 0.50 - 1.35 mg/dL   Calcium 09.6  8.4 - 04.5 mg/dL   Total Protein 7.1  6.0 - 8.3 g/dL   Albumin 4.6  3.5 - 5.2 g/dL   AST 38 (*) 0 - 37 U/L   ALT 19  0 - 53 U/L   Alkaline Phosphatase 76  39 - 117 U/L   Total Bilirubin 0.2 (*) 0.3 - 1.2 mg/dL   GFR calc non Af Amer 38 (*) >90 mL/min   GFR calc Af Amer 44 (*) >90 mL/min  URINALYSIS, ROUTINE W REFLEX MICROSCOPIC      Component Value Range   Color, Urine YELLOW  YELLOW   APPearance CLOUDY (*) CLEAR   Specific Gravity, Urine 1.021  1.005 - 1.030   pH 5.0  5.0 - 8.0   Glucose, UA NEGATIVE  NEGATIVE mg/dL   Hgb urine dipstick NEGATIVE  NEGATIVE   Bilirubin Urine NEGATIVE  NEGATIVE   Ketones, ur NEGATIVE  NEGATIVE mg/dL   Protein, ur 30 (*) NEGATIVE mg/dL   Urobilinogen, UA 0.2  0.0 - 1.0 mg/dL   Nitrite NEGATIVE  NEGATIVE   Leukocytes, UA NEGATIVE  NEGATIVE  GLUCOSE, CAPILLARY      Component Value Range   Glucose-Capillary 140 (*) 70 - 99 mg/dL  CARDIAC PANEL(CRET KIN+CKTOT+MB+TROPI)      Component Value Range   Total CK 632 (*) 7 - 232 U/L   CK, MB 12.5 (*) 0.3 - 4.0 ng/mL   Troponin I <0.30  <0.30 ng/mL   Relative Index 2.0  0.0 - 2.5  CARBAMAZEPINE LEVEL, TOTAL      Component Value Range   Carbamazepine Lvl 3.1 (*) 4.0 - 12.0 ug/mL  SEDIMENTATION RATE      Component Value Range   Sed Rate 9  0 - 16 mm/hr  POCT I-STAT  3, BLOOD GAS (G3+)      Component Value Range   pH, Arterial 7.335 (*) 7.350 - 7.450   pCO2 arterial 50.8 (*) 35.0 - 45.0 mmHg   pO2, Arterial 100.0  80.0 - 100.0 mmHg   Bicarbonate 27.1 (*) 20.0 - 24.0 mEq/L   TCO2 29  0 - 100 mmol/L   O2 Saturation 97.0     Acid-Base Excess 1.0  0.0 - 2.0 mmol/L   Patient temperature 98.3 F     Collection site RADIAL, ALLEN'S TEST ACCEPTABLE     Drawn by RT     Sample type ARTERIAL    URINE MICROSCOPIC-ADD ON      Component Value Range   Squamous Epithelial / LPF RARE  RARE   WBC, UA 0-2  <3 WBC/hpf   RBC / HPF 0-2  <3 RBC/hpf   Bacteria, UA MANY (*) RARE  Casts GRANULAR CAST (*) NEGATIVE  POCT I-STAT 3, BLOOD GAS (G3+)      Component Value Range   pH, Arterial 7.497 (*) 7.350 - 7.450   pCO2 arterial 35.9  35.0 - 45.0 mmHg   pO2, Arterial 38.0 (*) 80.0 - 100.0 mmHg   Bicarbonate 27.9 (*) 20.0 - 24.0 mEq/L   TCO2 29  0 - 100 mmol/L   O2 Saturation 78.0     Acid-Base Excess 4.0 (*) 0.0 - 2.0 mmol/L   Patient temperature 98.2 F     Collection site RADIAL, ALLEN'S TEST ACCEPTABLE     Drawn by RT     Sample type ARTERIAL     Comment MD NOTIFIED, SUGGEST RECOLLECT     Dg Chest 2 View  12/10/2011  *RADIOLOGY REPORT*  Clinical Data: Weakness, decreased ability to use legs, history of multiple sclerosis, hypertension, diabetes  CHEST - 2 VIEW  Comparison: 02/15/2011  Findings: Upper normal heart size. Mediastinal contours and pulmonary vascularity normal. Lungs clear. No pleural effusion or pneumothorax. Multilevel endplate spur formation thoracic spine.  IMPRESSION: No acute abnormalities.  Original Report Authenticated By: Lollie Marrow, M.D.   Ct Head Wo Contrast  12/10/2011  *RADIOLOGY REPORT*  Clinical Data: Weakness.  CT HEAD WITHOUT CONTRAST  Technique:  Contiguous axial images were obtained from the base of the skull through the vertex without contrast.  Comparison: 02/15/2011  Findings: Again noted are scattered areas of low density in the  subcortical white matter, particularly involving the left frontal lobe.  These findings have not significantly changed.  Normal appearance of the ventricles and basal cisterns.  No evidence for acute hemorrhage, mass lesion, midline shift, hydrocephalus or large infarct.  The visualized paranasal sinuses are clear.  No acute bony abnormalities.  IMPRESSION: Stable head CT findings.  No acute intracranial abnormalities.  Stable nonspecific white matter changes.  Original Report Authenticated By: Richarda Overlie, M.D.   Pt given 2 liters IV NS.   Pt unable to void, request foley cath,  Pt had 1000cc urinary output  MDM  I spoke to Dr. Ashley Royalty Hospitalist She request ABg and sed rate before admitting pt.  I called after labs returned and spoke to Dr. Jomarie Longs who will admit pt.          Lonia Skinner [place], [place] 12/10/11 2132  Lonia Skinner [place], [place] 12/10/11 2135  Lonia Skinner [place], [place] 12/10/11 2213  Lonia Skinner [place], [place] 12/10/11 2214

## 2011-12-10 NOTE — ED Notes (Signed)
Pt to room 5 by ems via stretcher. Pt is awake, alert, oriented x 4. Pt states he has had decreased ability to use his legs today. "shakier than usual". Denies any pain, pt uses hand crutches due to his history of ms, and has baseline leg and arm weakness. Denies any fall or injury.

## 2011-12-11 ENCOUNTER — Inpatient Hospital Stay (HOSPITAL_COMMUNITY): Payer: Medicare Other

## 2011-12-11 ENCOUNTER — Encounter (HOSPITAL_COMMUNITY): Payer: Self-pay | Admitting: Internal Medicine

## 2011-12-11 DIAGNOSIS — G8220 Paraplegia, unspecified: Secondary | ICD-10-CM | POA: Insufficient documentation

## 2011-12-11 DIAGNOSIS — R410 Disorientation, unspecified: Secondary | ICD-10-CM

## 2011-12-11 DIAGNOSIS — R4182 Altered mental status, unspecified: Secondary | ICD-10-CM | POA: Diagnosis present

## 2011-12-11 DIAGNOSIS — I10 Essential (primary) hypertension: Secondary | ICD-10-CM | POA: Diagnosis present

## 2011-12-11 DIAGNOSIS — E119 Type 2 diabetes mellitus without complications: Secondary | ICD-10-CM | POA: Diagnosis present

## 2011-12-11 DIAGNOSIS — N3281 Overactive bladder: Secondary | ICD-10-CM | POA: Diagnosis present

## 2011-12-11 DIAGNOSIS — G35 Multiple sclerosis: Secondary | ICD-10-CM | POA: Insufficient documentation

## 2011-12-11 HISTORY — DX: Disorientation, unspecified: R41.0

## 2011-12-11 LAB — GLUCOSE, CAPILLARY
Glucose-Capillary: 146 mg/dL — ABNORMAL HIGH (ref 70–99)
Glucose-Capillary: 190 mg/dL — ABNORMAL HIGH (ref 70–99)

## 2011-12-11 LAB — CBC
HCT: 36.1 % — ABNORMAL LOW (ref 39.0–52.0)
MCHC: 34.3 g/dL (ref 30.0–36.0)
MCV: 91.4 fL (ref 78.0–100.0)
RDW: 13.4 % (ref 11.5–15.5)

## 2011-12-11 LAB — CARDIAC PANEL(CRET KIN+CKTOT+MB+TROPI)
Relative Index: 1.9 (ref 0.0–2.5)
Troponin I: <0.3 ng/mL (ref <0.30)

## 2011-12-11 LAB — BASIC METABOLIC PANEL
BUN: 17 mg/dL (ref 6–23)
Creatinine, Ser: 0.89 mg/dL (ref 0.50–1.35)
GFR calc Af Amer: >90 mL/min (ref >90)
GFR calc non Af Amer: 89 mL/min — ABNORMAL LOW (ref >90)

## 2011-12-11 MED ORDER — GABAPENTIN 800 MG PO TABS
1600.0000 mg | ORAL_TABLET | Freq: Every evening | ORAL | Status: DC
  Filled 2011-12-11: qty 2

## 2011-12-11 MED ORDER — INSULIN ASPART 100 UNIT/ML ~~LOC~~ SOLN
0.0000 [IU] | Freq: Three times a day (TID) | SUBCUTANEOUS | Status: DC
  Administered 2011-12-11: 2 [IU] via SUBCUTANEOUS
  Administered 2011-12-11: 1 [IU] via SUBCUTANEOUS
  Administered 2011-12-12: 2 [IU] via SUBCUTANEOUS

## 2011-12-11 MED ORDER — ACETAMINOPHEN 325 MG PO TABS: 650.0000 mg | ORAL_TABLET | Freq: Four times a day (QID) | ORAL | Status: DC | PRN

## 2011-12-11 MED ORDER — GADOBENATE DIMEGLUMINE 529 MG/ML IV SOLN
15.0000 mL | Freq: Once | INTRAVENOUS | Status: AC | PRN
  Administered 2011-12-11: 15 mL via INTRAVENOUS

## 2011-12-11 MED ORDER — AMITRIPTYLINE HCL 25 MG PO TABS
25.0000 mg | ORAL_TABLET | Freq: Every day | ORAL | Status: DC
  Administered 2011-12-11: 25 mg via ORAL
  Filled 2011-12-11 (×2): qty 1

## 2011-12-11 MED ORDER — GABAPENTIN 400 MG PO CAPS
800.0000 mg | ORAL_CAPSULE | Freq: Every day | ORAL | Status: DC
  Administered 2011-12-12: 800 mg via ORAL
  Filled 2011-12-11 (×2): qty 2

## 2011-12-11 MED ORDER — GABAPENTIN 800 MG PO TABS: 800.0000 mg | ORAL_TABLET | Freq: Every morning | ORAL | Status: DC

## 2011-12-11 MED ORDER — ACETAMINOPHEN 650 MG RE SUPP: 650.0000 mg | Freq: Four times a day (QID) | RECTAL | Status: DC | PRN

## 2011-12-11 MED ORDER — BACLOFEN 20 MG PO TABS
80.0000 mg | ORAL_TABLET | Freq: Every evening | ORAL | Status: DC
  Administered 2011-12-11: 80 mg via ORAL
  Filled 2011-12-11 (×2): qty 4

## 2011-12-11 MED ORDER — GABAPENTIN 400 MG PO CAPS
1600.0000 mg | ORAL_CAPSULE | Freq: Every day | ORAL | Status: DC
  Administered 2011-12-11: 1600 mg via ORAL
  Filled 2011-12-11 (×2): qty 4

## 2011-12-11 MED ORDER — BACLOFEN 20 MG PO TABS
20.0000 mg | ORAL_TABLET | Freq: Two times a day (BID) | ORAL | Status: DC
  Administered 2011-12-11: 20 mg via ORAL
  Filled 2011-12-11 (×2): qty 1

## 2011-12-11 MED ORDER — GABAPENTIN 800 MG PO TABS
800.0000 mg | ORAL_TABLET | Freq: Two times a day (BID) | ORAL | Status: DC
  Administered 2011-12-11: 800 mg via ORAL
  Filled 2011-12-11 (×2): qty 1

## 2011-12-11 MED ORDER — SODIUM CHLORIDE 0.9 % IV SOLN
INTRAVENOUS | Status: DC
  Administered 2011-12-11: 01:00:00 via INTRAVENOUS

## 2011-12-11 MED ORDER — ASPIRIN 81 MG PO TABS: 81.0000 mg | ORAL_TABLET | Freq: Every day | ORAL | Status: DC

## 2011-12-11 MED ORDER — ASPIRIN 81 MG PO CHEW
81.0000 mg | CHEWABLE_TABLET | Freq: Every day | ORAL | Status: DC
  Administered 2011-12-11 – 2011-12-12 (×2): 81 mg via ORAL
  Filled 2011-12-11 (×2): qty 1

## 2011-12-11 MED ORDER — TRAMADOL HCL 50 MG PO TABS: 50.0000 mg | ORAL_TABLET | Freq: Four times a day (QID) | ORAL | Status: DC | PRN

## 2011-12-11 MED ORDER — LISINOPRIL 20 MG PO TABS
20.0000 mg | ORAL_TABLET | Freq: Every day | ORAL | Status: DC
  Administered 2011-12-11 – 2011-12-12 (×2): 20 mg via ORAL
  Filled 2011-12-11 (×2): qty 1

## 2011-12-11 MED ORDER — TIZANIDINE HCL 2 MG PO TABS
2.0000 mg | ORAL_TABLET | Freq: Two times a day (BID) | ORAL | Status: DC | PRN
  Filled 2011-12-11: qty 2

## 2011-12-11 MED ORDER — ENOXAPARIN SODIUM 40 MG/0.4ML ~~LOC~~ SOLN
40.0000 mg | SUBCUTANEOUS | Status: DC
  Administered 2011-12-11 – 2011-12-12 (×2): 40 mg via SUBCUTANEOUS
  Filled 2011-12-11 (×2): qty 0.4

## 2011-12-11 MED ORDER — PIOGLITAZONE HCL 30 MG PO TABS
30.0000 mg | ORAL_TABLET | Freq: Every day | ORAL | Status: DC
  Administered 2011-12-11 – 2011-12-12 (×2): 30 mg via ORAL
  Filled 2011-12-11 (×3): qty 1

## 2011-12-11 MED ORDER — CARBAMAZEPINE 100 MG PO CHEW
100.0000 mg | CHEWABLE_TABLET | Freq: Two times a day (BID) | ORAL | Status: DC
Start: 2011-12-11 — End: 2011-12-12
  Administered 2011-12-11 – 2011-12-12 (×3): 100 mg via ORAL
  Filled 2011-12-11 (×5): qty 1

## 2011-12-11 MED ORDER — SODIUM CHLORIDE 0.9 % IV SOLN
500.0000 mg | Freq: Every day | INTRAVENOUS | Status: DC
  Administered 2011-12-11 – 2011-12-12 (×2): 500 mg via INTRAVENOUS
  Filled 2011-12-11 (×2): qty 4

## 2011-12-11 MED ORDER — BACLOFEN 20 MG PO TABS
60.0000 mg | ORAL_TABLET | Freq: Every morning | ORAL | Status: DC
  Administered 2011-12-12: 60 mg via ORAL
  Filled 2011-12-11: qty 3

## 2011-12-11 MED ORDER — TIZANIDINE HCL 4 MG PO CAPS: 2.0000 mg | ORAL_CAPSULE | Freq: Two times a day (BID) | ORAL | Status: DC | PRN

## 2011-12-11 NOTE — Progress Notes (Signed)
Subjective: Patient feels like his mentation is back to his baseline.  Denies any pain.  Reports his weakness and lower extremity is improved.  Objective: Vital signs in last 24 hours: Filed Vitals:   12/10/11 2124 12/10/11 2220 12/10/11 2349 12/11/11 0500  BP: 155/92 167/93 172/98 145/93  Pulse: 87 86 86 81  Temp: 98.3 F (36.8 C) 98.2 F (36.8 C) 98.7 F (37.1 C) 98.3 F (36.8 C)  TempSrc:  Oral    Resp: 13 14 20 18   Height:   5\' 7"  (1.702 m)   Weight:   74.4 kg (164 lb 0.4 oz)   SpO2: 100%  100% 100%   Weight change:   Intake/Output Summary (Last 24 hours) at 12/11/11 0820 Last data filed at 12/11/11 0700  Gross per 24 hour  Intake      0 ml  Output   2675 ml  Net  -2675 ml    Physical Exam: General: Awake, Oriented, No acute distress. HEENT: EOMI. Neck: Supple CV: S1 and S2 Lungs: Clear to ascultation bilaterally Abdomen: Soft, Nontender, Nondistended, +bowel sounds. Ext: Good pulses. Trace edema.  Lab Results: Basic Metabolic Panel:  Lab 12/11/11 7846 12/10/11 1330  NA 143 140  K 3.8 3.8  CL 104 98  CO2 28 30  GLUCOSE 107* 154*  BUN 17 33*  CREATININE 0.89 1.80*  CALCIUM 9.8 10.4  MG -- --  PHOS -- --   Liver Function Tests:  Lab 12/10/11 1330  AST 38*  ALT 19  ALKPHOS 76  BILITOT 0.2*  PROT 7.1  ALBUMIN 4.6   No results found for this basename: LIPASE:5,AMYLASE:5 in the last 168 hours No results found for this basename: AMMONIA:5 in the last 168 hours CBC:  Lab 12/11/11 0100 12/10/11 1330  WBC 4.7 5.1  NEUTROABS -- 4.0  HGB 12.4* 11.8*  HCT 36.1* 34.0*  MCV 91.4 91.6  PLT 262 257   Cardiac Enzymes:  Lab 12/11/11 0100 12/10/11 1330  CKTOTAL 565* 632*  CKMB 10.9* 12.5*  CKMBINDEX -- --  TROPONINI <0.30 <0.30   BNP (last 3 results) No results found for this basename: PROBNP:3 in the last 8760 hours CBG:  Lab 12/11/11 0734 12/10/11 2336 12/10/11 1342  GLUCAP 98 92 140*   No results found for this basename: HGBA1C:5 in the  last 72 hours Other Labs: No components found with this basename: POCBNP:3 No results found for this basename: DDIMER:2 in the last 168 hours No results found for this basename: CHOL:2,HDL:2,LDLCALC:2,TRIG:2,CHOLHDL:2,LDLDIRECT:2 in the last 168 hours No results found for this basename: TSH,T4TOTAL,FREET3,T3FREE,FREET4,THYROIDAB in the last 168 hours No results found for this basename: VITAMINB12:2,FOLATE:2,FERRITIN:2,TIBC:2,IRON:2,RETICCTPCT:2 in the last 168 hours  Micro Results: No results found for this or any previous visit (from the past 240 hour(s)).  Studies/Results: Dg Chest 2 View  12/10/2011  *RADIOLOGY REPORT*  Clinical Data: Weakness, decreased ability to use legs, history of multiple sclerosis, hypertension, diabetes  CHEST - 2 VIEW  Comparison: 02/15/2011  Findings: Upper normal heart size. Mediastinal contours and pulmonary vascularity normal. Lungs clear. No pleural effusion or pneumothorax. Multilevel endplate spur formation thoracic spine.  IMPRESSION: No acute abnormalities.  Original Report Authenticated By: Lollie Marrow, M.D.   Ct Head Wo Contrast  12/10/2011  *RADIOLOGY REPORT*  Clinical Data: Weakness.  CT HEAD WITHOUT CONTRAST  Technique:  Contiguous axial images were obtained from the base of the skull through the vertex without contrast.  Comparison: 02/15/2011  Findings: Again noted are scattered areas of low density  in the subcortical white matter, particularly involving the left frontal lobe.  These findings have not significantly changed.  Normal appearance of the ventricles and basal cisterns.  No evidence for acute hemorrhage, mass lesion, midline shift, hydrocephalus or large infarct.  The visualized paranasal sinuses are clear.  No acute bony abnormalities.  IMPRESSION: Stable head CT findings.  No acute intracranial abnormalities.  Stable nonspecific white matter changes.  Original Report Authenticated By: Richarda Overlie, M.D.    Medications: I have reviewed the  patient's current medications. Scheduled Meds:   . sodium chloride   Intravenous Once  . amitriptyline  25 mg Oral QHS  . aspirin  81 mg Oral Daily  . baclofen  20 mg Oral BID  . carbamazepine  100 mg Oral BID  . enoxaparin (LOVENOX) injection  40 mg Subcutaneous Q24H  . gabapentin  800 mg Oral BID  . insulin aspart  0-9 Units Subcutaneous TID WC  . lidocaine   Urethral Once  . lisinopril  20 mg Oral Daily  . methylPREDNISolone (SOLU-MEDROL) injection  500 mg Intravenous Daily  . pioglitazone  30 mg Oral QAC breakfast  . DISCONTD: aspirin  81 mg Oral Daily   Continuous Infusions:   . sodium chloride 75 mL/hr at 12/11/11 0114   PRN Meds:.acetaminophen, acetaminophen, tiZANidine, traMADol, DISCONTD: tiZANidine  Assessment/Plan: Altered mental status/acute delirium/encephalopathy with lower extremity weakness Patient started on Solu-Medrol 500 mg daily for 3 days on 12/10/2011 for MS exacerbation.  MRI of the brain, cervical spine, and thoracic spine with and without gadolinium ordered by neurology.  Carbamazepine level low at 3.1 (ref range 4.0-12.0), will defer to neurology for adjustment of dose carbamazepine.  Appreciate neurology evaluation.  Continue physical therapy.  Mentation seems to be at his baseline.  DM2 uncontrolled with complications Continue pioglitazone, hold metformin, sliding scale insulin.  Mildly elevated CK/CKMB Index and troponin are normal. This may be related to mild rhabdo from muscle injury/crawling episodes over last 48 hours.   Urinary retention likely from underlying MS  Urine analysis negative for urinary tract infection, urine culture sent and pending.  Hypertension Stable.  Continue lisinopril.  Prophylaxis Lovenox.  Disposition Pending neuro workup and physical therapy.   LOS: 1 day  Laikynn Pollio A, MD 12/11/2011, 8:20 AM

## 2011-12-11 NOTE — Evaluation (Signed)
Physical Therapy Evaluation Patient Details Name: Larry [name] MRN: [id number] DOB: [birth year] Today's Date: 12/11/2011 Time: 6578-4696 PT Time Calculation (min): 23 min  PT Assessment / Plan / Recommendation Clinical Impression  Patient presented to hospital with inability to ambulate. He is now at baseline for all his mobility as he has prior limitations due to spasticity from MS. He has no skilled PT needs.    PT Assessment  Patent does not need any further PT services    Follow Up Recommendations  No PT follow up    Barriers to Discharge  None      Equipment Recommendations  None recommended by PT    Recommendations for Other Services  None  Frequency  N/A   Precautions / Restrictions Precautions Precautions: Fall   Pertinent Vitals/Pain VSS/ No pain      Mobility  Bed Mobility Bed Mobility: Supine to Sit;Sitting - Scoot to Edge of Bed;Sit to Supine Supine to Sit: 6: Modified independent (Device/Increase time) Sitting - Scoot to Edge of Bed: 6: Modified independent (Device/Increase time) Sit to Supine: 6: Modified independent (Device/Increase time) Transfers Transfers: Sit to Stand;Stand to Sit Sit to Stand: 6: Modified independent (Device/Increase time);From bed Stand to Sit: 6: Modified independent (Device/Increase time);To bed Ambulation/Gait Ambulation/Gait Assistance: 6: Modified independent (Device/Increase time) Ambulation Distance (Feet): 80 Feet Assistive device: Lofstrands Ambulation/Gait Assistance Details: Four point gait pattern.  Gait Pattern: Trunk flexed;Step-through pattern;Decreased stride length Stairs: Yes Stairs Assistance: 6: Modified independent (Device/Increase time) Stair Management Technique: One rail Right;Step to pattern;Forwards;With crutches Number of Stairs: 3      Visit Information  Last PT Received On: 12/11/11 Assistance Needed: +1    Subjective Data  Subjective: Patient reports inability to ambulate yesterday and on  and off at the begininng of the week also. He reports usually he can walk around grocery store with cart for support and in house with crutches without difficulty.  Patient Stated Goal: Home   Prior Functioning  Home Living Lives With: Alone Available Help at Discharge: Family;Friend(s);Available PRN/intermittently Type of Home: House Home Access: Level entry Home Layout: Two level Alternate Level Stairs-Number of Steps: 12 Alternate Level Stairs-Rails: Right Bathroom Shower/Tub: Engineer, manufacturing systems: Standard Home Adaptive Equipment: Crutches Prior Function Level of Independence: Independent Able to Take Stairs?: Yes Driving: Yes (but no car available) Communication Communication: No difficulties    Cognition  Overall Cognitive Status: Appears within functional limits for tasks assessed/performed Arousal/Alertness: Awake/alert Orientation Level: Appears intact for tasks assessed Behavior During Session: [facility] for tasks performed    Extremity/Trunk Assessment Right Lower Extremity Assessment RLE ROM/Strength/Tone: Deficits RLE ROM/Strength/Tone Deficits: Baseline spasticity limiting knee and hip flexion and dorsiflexion. Use of upper extremities for all knee flexion activities including don and doff socks.  RLE Coordination: Deficits RLE Coordination Deficits: Due to spasticity Left Lower Extremity Assessment LLE ROM/Strength/Tone: Deficits LLE ROM/Strength/Tone Deficits: Baseline spasticity limiting knee and hip flexion and dorsiflexion. Use of upper extremities for all knee flexion activities including don and doff socks.  LLE Coordination: Deficits LLE Coordination Deficits: Due to spasticity Trunk Assessment Trunk Assessment: Kyphotic (Flexed at hips and trunk with gait)   Balance High Level Balance High Level Balance Comments: Requires support of crutches for all standing activity  End of Session PT - End of Session Equipment Utilized During Treatment: Gait  belt Activity Tolerance: Patient tolerated treatment well Patient left: in bed;with call bell/phone within reach;with family/visitor present Nurse Communication: Mobility status   Edwyna Perfect, PT  Pager [phone]  12/11/2011, 11:34 AM

## 2011-12-11 NOTE — Progress Notes (Signed)
S/O: Patient states that his lower extremity strength is improving, now able to elevate off bed.   BP 167/90  Pulse 92  Temp 98.3 F (36.8 C) (Oral)  Resp 18  Ht 5\' 7"  (1.702 m)  Wt 74.4 kg (164 lb 0.4 oz)  BMI 25.69 kg/m2  SpO2 100%  Neurological exam: Ment: Intact to complex questions and commands.  CN: Facies symmetric. Tracks examiner normally.  Motor: Bilateral upper extremities 4+/5. Bilateral lower extremities 4/5 with increased tone. Cerebellar: Mild dyssinergia on left FNF.    A/R: Multiple sclerosis exacerbation.  1. Increase IV solumedrol 1000 mg qd for 5 days.  2. PT/OT 3. Monitor for signs of possible infection. Monitor electrolytes.  4. MRI studies are pending.   Electronically signed: Dr. Caryl Pina

## 2011-12-11 NOTE — H&P (Signed)
PCP:  Leilani Able, MD  Neurologist: Dr.Yan  Chief Complaint:  Intermittent confusion and leg weakness  HPI: Larry [name] is a 61 year old male with long-standing history of MS, ambulates using crutches, presented to ER at [facility] with intermittent confusion and lower extremity weakness that started yesterday(Wednesday) Patient reports that Tuesday he felt fine, was able to do all his ADLs, Wednesday he went out with his friend to church and to book his cruise tickets on his way back he didn't feel quite right, when they dropped him home he couldn't quite use his legs to walk and had to crawl his way, and they had to help lift him up, in addition his daughter who lives in [place] came by to check on him since she hadn't heard from him all day, she noted that he was confused that night, but by the next morning (Thursday ) after waking up he was back to his usual self, walked up the stairs,  took a shower, his confusion also completely resolved. Then his daughter left to buy some groceries, subsequently came back in the afternoon by then he was confused again and couldn't walk had to crawl his way around. Patient denies any fevers or chills, he denies any abdominal pain, diarrhea. He reports having trouble urinating and had to self catheterize himself twice. He denies any recent changes in medications  Allergies:  No Known Allergies    Past Medical History  Diagnosis Date  . MS (multiple sclerosis)   . Diabetes mellitus   . Hypertension   . Dementia     History reviewed. No pertinent past surgical history.  Prior to Admission medications   Medication Sig Start Date End Date Taking? Authorizing Provider  amitriptyline (ELAVIL) 25 MG tablet Take 25 mg by mouth at bedtime.   Yes Historical Provider, MD  aspirin 81 MG tablet Take 81 mg by mouth daily.   Yes Historical Provider, MD  baclofen (LIORESAL) 20 MG tablet Take 20 mg by mouth 3 (three) times daily. And at bedtime  as needed   Yes Historical Provider, MD  carbamazepine (TEGRETOL) 100 MG chewable tablet Chew by mouth 2 (two) times daily.   Yes Historical Provider, MD  cholecalciferol (VITAMIN D) 1000 UNITS tablet Take 1,000 Units by mouth daily.   Yes Historical Provider, MD  gabapentin (NEURONTIN) 800 MG tablet Take 800 mg by mouth 2 (two) times daily. And  2 tablets at bedtime   Yes Historical Provider, MD  lisinopril (PRINIVIL,ZESTRIL) 20 MG tablet Take 20 mg by mouth daily.   Yes Historical Provider, MD  metFORMIN (GLUCOPHAGE) 1000 MG tablet Take 1,000 mg by mouth 2 (two) times daily with a meal.   Yes Historical Provider, MD  Multiple Vitamin (MULTIVITAMIN) tablet Take 1 tablet by mouth daily.   Yes Historical Provider, MD  pioglitazone (ACTOS) 30 MG tablet Take 30 mg by mouth daily.   Yes Historical Provider, MD  tiZANidine (ZANAFLEX) 4 MG capsule Take 2-4 mg by mouth 2 (two) times daily as needed.   Yes Historical Provider, MD  traMADol (ULTRAM) 50 MG tablet Take 50 mg by mouth 4 (four) times daily.   Yes Historical Provider, MD    Social History:  reports that he has never smoked. He does not have any smokeless tobacco history on file. He reports that he does not drink alcohol or use illicit drugs.  History reviewed. No pertinent family history.  Review of Systems:  Constitutional: Denies fever, chills, diaphoresis, appetite change and  fatigue.  HEENT: Denies photophobia, eye pain, redness, hearing loss, ear pain, congestion, sore throat, rhinorrhea, sneezing, mouth sores, trouble swallowing, neck pain, neck stiffness and tinnitus.   Respiratory: Denies SOB, DOE, cough, chest tightness,  and wheezing.   Cardiovascular: Denies chest pain, palpitations and leg swelling.  Gastrointestinal: Denies nausea, vomiting, abdominal pain, diarrhea, constipation, blood in stool and abdominal distention.  Genitourinary: Denies dysuria, urgency, frequency, hematuria, flank pain and difficulty urinating.    Musculoskeletal: Denies myalgias, back pain, joint swelling, arthralgias and gait problem.  Skin: Denies pallor, rash and wound.  Neurological: Denies dizziness, seizures, syncope, weakness, light-headedness, numbness and headaches.  Hematological: Denies adenopathy. Easy bruising, personal or family bleeding history  Psychiatric/Behavioral: Denies suicidal ideation, mood changes, confusion, nervousness, sleep disturbance and agitation   Physical Exam: Blood pressure 172/98, pulse 86, temperature 98.7 F (37.1 C), temperature source Oral, resp. rate 20, height 5\' 7"  (1.702 m), weight 74.4 kg (164 lb 0.4 oz), SpO2 100.00%. General alert awake oriented x3 in no acute distress  HEENT oral mucosa moist and pink, pupils reactive to light CVS S1-S2 regular rate rhythm no murmurs rubs or gallops Lungs clear auscultation bilaterally Abdomen soft nontender with normal bowel sounds no organomegaly Extremities no edema clubbing or cyanosis Musculoskeletal no evidence of synovitis or joint deformities Neurological: Motor strength in lower extremities is 3/5,  Decreased light touch in lower extremities Reflexes diminished in lower extremity, 1+ at the upper extremity Plantars no response bilaterally Skin no evidence of skin breakdown or rashes  Labs on Admission:  Results for orders placed during the hospital encounter of 12/10/11 (from the past 48 hour(s))  CBC WITH DIFFERENTIAL     Status: Abnormal   Collection Time   12/10/11  1:30 PM      Component Value Range Comment   WBC 5.1  4.0 - 10.5 K/uL    RBC 3.71 (*) 4.22 - 5.81 MIL/uL    Hemoglobin 11.8 (*) 13.0 - 17.0 g/dL    HCT 30.8 (*) 65.7 - 52.0 %    MCV 91.6  78.0 - 100.0 fL    MCH 31.8  26.0 - 34.0 pg    MCHC 34.7  30.0 - 36.0 g/dL    RDW 84.6  96.2 - 95.2 %    Platelets 257  150 - 400 K/uL    Neutrophils Relative 79 (*) 43 - 77 %    Neutro Abs 4.0  1.7 - 7.7 K/uL    Lymphocytes Relative 13  12 - 46 %    Lymphs Abs 0.6 (*) 0.7 - 4.0  K/uL    Monocytes Relative 8  3 - 12 %    Monocytes Absolute 0.4  0.1 - 1.0 K/uL    Eosinophils Relative 0  0 - 5 %    Eosinophils Absolute 0.0  0.0 - 0.7 K/uL    Basophils Relative 0  0 - 1 %    Basophils Absolute 0.0  0.0 - 0.1 K/uL   COMPREHENSIVE METABOLIC PANEL     Status: Abnormal   Collection Time   12/10/11  1:30 PM      Component Value Range Comment   Sodium 140  135 - 145 mEq/L    Potassium 3.8  3.5 - 5.1 mEq/L    Chloride 98  96 - 112 mEq/L    CO2 30  19 - 32 mEq/L    Glucose, Bld 154 (*) 70 - 99 mg/dL    BUN 33 (*) 6 - 23 mg/dL  Creatinine, Ser 1.80 (*) 0.50 - 1.35 mg/dL    Calcium 16.1  8.4 - 10.5 mg/dL    Total Protein 7.1  6.0 - 8.3 g/dL    Albumin 4.6  3.5 - 5.2 g/dL    AST 38 (*) 0 - 37 U/L    ALT 19  0 - 53 U/L    Alkaline Phosphatase 76  39 - 117 U/L    Total Bilirubin 0.2 (*) 0.3 - 1.2 mg/dL    GFR calc non Af Amer 38 (*) >90 mL/min    GFR calc Af Amer 44 (*) >90 mL/min   CARDIAC PANEL(CRET KIN+CKTOT+MB+TROPI)     Status: Abnormal   Collection Time   12/10/11  1:30 PM      Component Value Range Comment   Total CK 632 (*) 7 - 232 U/L    CK, MB 12.5 (*) 0.3 - 4.0 ng/mL    Troponin I <0.30  <0.30 ng/mL    Relative Index 2.0  0.0 - 2.5   GLUCOSE, CAPILLARY     Status: Abnormal   Collection Time   12/10/11  1:42 PM      Component Value Range Comment   Glucose-Capillary 140 (*) 70 - 99 mg/dL   SEDIMENTATION RATE     Status: Normal   Collection Time   12/10/11  1:44 PM      Component Value Range Comment   Sed Rate 9  0 - 16 mm/hr   CARBAMAZEPINE LEVEL, TOTAL     Status: Abnormal   Collection Time   12/10/11  4:00 PM      Component Value Range Comment   Carbamazepine Lvl 3.1 (*) 4.0 - 12.0 ug/mL   URINALYSIS, ROUTINE W REFLEX MICROSCOPIC     Status: Abnormal   Collection Time   12/10/11  5:40 PM      Component Value Range Comment   Color, Urine YELLOW  YELLOW    APPearance CLOUDY (*) CLEAR    Specific Gravity, Urine 1.021  1.005 - 1.030    pH 5.0  5.0  - 8.0    Glucose, UA NEGATIVE  NEGATIVE mg/dL    Hgb urine dipstick NEGATIVE  NEGATIVE    Bilirubin Urine NEGATIVE  NEGATIVE    Ketones, ur NEGATIVE  NEGATIVE mg/dL    Protein, ur 30 (*) NEGATIVE mg/dL    Urobilinogen, UA 0.2  0.0 - 1.0 mg/dL    Nitrite NEGATIVE  NEGATIVE    Leukocytes, UA NEGATIVE  NEGATIVE   URINE MICROSCOPIC-ADD ON     Status: Abnormal   Collection Time   12/10/11  5:40 PM      Component Value Range Comment   Squamous Epithelial / LPF RARE  RARE    WBC, UA 0-2  <3 WBC/hpf    RBC / HPF 0-2  <3 RBC/hpf    Bacteria, UA MANY (*) RARE    Casts GRANULAR CAST (*) NEGATIVE   POCT I-STAT 3, BLOOD GAS (G3+)     Status: Abnormal   Collection Time   12/10/11  5:50 PM      Component Value Range Comment   pH, Arterial 7.497 (*) 7.350 - 7.450    pCO2 arterial 35.9  35.0 - 45.0 mmHg    pO2, Arterial 38.0 (*) 80.0 - 100.0 mmHg    Bicarbonate 27.9 (*) 20.0 - 24.0 mEq/L    TCO2 29  0 - 100 mmol/L    O2 Saturation 78.0      Acid-Base Excess 4.0 (*)  0.0 - 2.0 mmol/L    Patient temperature 98.2 F      Collection site RADIAL, ALLEN'S TEST ACCEPTABLE      Drawn by RT      Sample type ARTERIAL      Comment MD NOTIFIED, SUGGEST RECOLLECT     POCT I-STAT 3, BLOOD GAS (G3+)     Status: Abnormal   Collection Time   12/10/11  6:01 PM      Component Value Range Comment   pH, Arterial 7.335 (*) 7.350 - 7.450    pCO2 arterial 50.8 (*) 35.0 - 45.0 mmHg    pO2, Arterial 100.0  80.0 - 100.0 mmHg    Bicarbonate 27.1 (*) 20.0 - 24.0 mEq/L    TCO2 29  0 - 100 mmol/L    O2 Saturation 97.0      Acid-Base Excess 1.0  0.0 - 2.0 mmol/L    Patient temperature 98.3 F      Collection site RADIAL, ALLEN'S TEST ACCEPTABLE      Drawn by RT      Sample type ARTERIAL     GLUCOSE, CAPILLARY     Status: Normal   Collection Time   12/10/11 11:36 PM      Component Value Range Comment   Glucose-Capillary 92  70 - 99 mg/dL     Radiological Exams on Admission: Dg Chest 2 View  12/10/2011  *RADIOLOGY  REPORT*  Clinical Data: Weakness, decreased ability to use legs, history of multiple sclerosis, hypertension, diabetes  CHEST - 2 VIEW  Comparison: 02/15/2011  Findings: Upper normal heart size. Mediastinal contours and pulmonary vascularity normal. Lungs clear. No pleural effusion or pneumothorax. Multilevel endplate spur formation thoracic spine.  IMPRESSION: No acute abnormalities.  Original Report Authenticated By: Lollie Marrow, M.D.   Ct Head Wo Contrast  12/10/2011  *RADIOLOGY REPORT*  Clinical Data: Weakness.  CT HEAD WITHOUT CONTRAST  Technique:  Contiguous axial images were obtained from the base of the skull through the vertex without contrast.  Comparison: 02/15/2011  Findings: Again noted are scattered areas of low density in the subcortical white matter, particularly involving the left frontal lobe.  These findings have not significantly changed.  Normal appearance of the ventricles and basal cisterns.  No evidence for acute hemorrhage, mass lesion, midline shift, hydrocephalus or large infarct.  The visualized paranasal sinuses are clear.  No acute bony abnormalities.  IMPRESSION: Stable head CT findings.  No acute intracranial abnormalities.  Stable nonspecific white matter changes.  Original Report Authenticated By: Richarda Overlie, M.D.    Assessment/Plan 1. Unusual episodic constellation of symptoms (Altered mentation/Paraplegia) Could be related to MS since it can affect any part of the central nervous system Will request neurology consult Get MRI brain and possibly LS-spine, await Neuro input  Physical therapy Continue chronic medications 2. DM: continue Actos, hold metformin, sliding scale insulin 3. Mildly elevated CK/CKMB: Index and troponin are normal , This may be related to muscle injury/ crawling episodes over last 48 hours. We'll check one more set and monitor on telemetry overnight. 4. Urinary retention likely from underlying MS Check urine culture  Time Spent on  Admission:  Larry [name] [facility] Pager: [phone] 12/11/2011, 12:17 AM

## 2011-12-11 NOTE — Consult Note (Signed)
Reason for Consult Multiple sclerosis Referring Physician: Dr. Fabio Pierce Larry [name] is an 61 y.o. male.  HPI:    Larry [name] is a 61 year old right-handed black male with a history of multiple sclerosis diagnosed in 1996. The patient presented with a slowly progressive spastic paraparesis. Within the last year, the patient has begun using crutches rather than a cane for ambulation. The patient has significant spasticity involving the legs, and he is on baclofen and tizanidine. The patient also takes carbamazepine and Neurontin. The patient is on Avonex for his multiple sclerosis. The patient has not had well-defined MS exacerbations in the past. 3 days prior to admission, the patient began noting some problems with weakness of the lower extremities. The patient was not able to ambulate effectively for periods of time, but his symptoms would wax and wane. The patient at times would be a walk with the crutches, and other times not. The patient denies that he became overheated. The patient did have some blurring of vision 2 days ago. The patient also has had episodes of confusion. The patient presents with an alteration in his functional walking. The patient has also noted some changes in bladder function, and has required in and out catheterization on 2 occasions. The patient denies any significant numbness of the legs, and he denies pain. The patient denies problems with the use of the arms. Currently, the vision is normal. Neurology has been asked to see this patient for further evaluation.  Past Medical History  Diagnosis Date  . MS (multiple sclerosis)   . Diabetes mellitus   . Hypertension   . Dementia   . Abnormality of gait     Past Surgical History  Procedure Date  . Lumbar laminectomy     History reviewed. No pertinent family history.  Social History:  reports that he has never smoked. He does not have any smokeless tobacco history on file. He reports that he does not drink alcohol  or use illicit drugs.  Allergies: No Known Allergies  Medications:  Scheduled:   . sodium chloride   Intravenous Once  . amitriptyline  25 mg Oral QHS  . aspirin  81 mg Oral Daily  . baclofen  20 mg Oral BID  . carbamazepine  100 mg Oral BID  . enoxaparin (LOVENOX) injection  40 mg Subcutaneous Q24H  . gabapentin  800 mg Oral BID  . insulin aspart  0-9 Units Subcutaneous TID WC  . lidocaine   Urethral Once  . lisinopril  20 mg Oral Daily  . pioglitazone  30 mg Oral QAC breakfast  . DISCONTD: aspirin  81 mg Oral Daily   Continuous:   . sodium chloride 75 mL/hr at 12/11/11 0114   ZOX:[id number], acetaminophen, tiZANidine, traMADol, DISCONTD: tiZANidine  Results for orders placed during the hospital encounter of 12/10/11 (from the past 48 hour(s))  CBC WITH DIFFERENTIAL     Status: Abnormal   Collection Time   12/10/11  1:30 PM      Component Value Range Comment   WBC 5.1  4.0 - 10.5 K/uL    RBC 3.71 (*) 4.22 - 5.81 MIL/uL    Hemoglobin 11.8 (*) 13.0 - 17.0 g/dL    HCT 14.7 (*) 82.9 - 52.0 %    MCV 91.6  78.0 - 100.0 fL    MCH 31.8  26.0 - 34.0 pg    MCHC 34.7  30.0 - 36.0 g/dL    RDW 56.2  13.0 - 86.5 %    Platelets  257  150 - 400 K/uL    Neutrophils Relative 79 (*) 43 - 77 %    Neutro Abs 4.0  1.7 - 7.7 K/uL    Lymphocytes Relative 13  12 - 46 %    Lymphs Abs 0.6 (*) 0.7 - 4.0 K/uL    Monocytes Relative 8  3 - 12 %    Monocytes Absolute 0.4  0.1 - 1.0 K/uL    Eosinophils Relative 0  0 - 5 %    Eosinophils Absolute 0.0  0.0 - 0.7 K/uL    Basophils Relative 0  0 - 1 %    Basophils Absolute 0.0  0.0 - 0.1 K/uL   COMPREHENSIVE METABOLIC PANEL     Status: Abnormal   Collection Time   12/10/11  1:30 PM      Component Value Range Comment   Sodium 140  135 - 145 mEq/L    Potassium 3.8  3.5 - 5.1 mEq/L    Chloride 98  96 - 112 mEq/L    CO2 30  19 - 32 mEq/L    Glucose, Bld 154 (*) 70 - 99 mg/dL    BUN 33 (*) 6 - 23 mg/dL    Creatinine, Ser 1.61 (*) 0.50 - 1.35 mg/dL     Calcium 09.6  8.4 - 10.5 mg/dL    Total Protein 7.1  6.0 - 8.3 g/dL    Albumin 4.6  3.5 - 5.2 g/dL    AST 38 (*) 0 - 37 U/L    ALT 19  0 - 53 U/L    Alkaline Phosphatase 76  39 - 117 U/L    Total Bilirubin 0.2 (*) 0.3 - 1.2 mg/dL    GFR calc non Af Amer 38 (*) >90 mL/min    GFR calc Af Amer 44 (*) >90 mL/min   CARDIAC PANEL(CRET KIN+CKTOT+MB+TROPI)     Status: Abnormal   Collection Time   12/10/11  1:30 PM      Component Value Range Comment   Total CK 632 (*) 7 - 232 U/L    CK, MB 12.5 (*) 0.3 - 4.0 ng/mL    Troponin I <0.30  <0.30 ng/mL    Relative Index 2.0  0.0 - 2.5   GLUCOSE, CAPILLARY     Status: Abnormal   Collection Time   12/10/11  1:42 PM      Component Value Range Comment   Glucose-Capillary 140 (*) 70 - 99 mg/dL   SEDIMENTATION RATE     Status: Normal   Collection Time   12/10/11  1:44 PM      Component Value Range Comment   Sed Rate 9  0 - 16 mm/hr   CARBAMAZEPINE LEVEL, TOTAL     Status: Abnormal   Collection Time   12/10/11  4:00 PM      Component Value Range Comment   Carbamazepine Lvl 3.1 (*) 4.0 - 12.0 ug/mL   URINALYSIS, ROUTINE W REFLEX MICROSCOPIC     Status: Abnormal   Collection Time   12/10/11  5:40 PM      Component Value Range Comment   Color, Urine YELLOW  YELLOW    APPearance CLOUDY (*) CLEAR    Specific Gravity, Urine 1.021  1.005 - 1.030    pH 5.0  5.0 - 8.0    Glucose, UA NEGATIVE  NEGATIVE mg/dL    Hgb urine dipstick NEGATIVE  NEGATIVE    Bilirubin Urine NEGATIVE  NEGATIVE    Ketones, ur NEGATIVE  NEGATIVE mg/dL    Protein, ur 30 (*) NEGATIVE mg/dL    Urobilinogen, UA 0.2  0.0 - 1.0 mg/dL    Nitrite NEGATIVE  NEGATIVE    Leukocytes, UA NEGATIVE  NEGATIVE   URINE MICROSCOPIC-ADD ON     Status: Abnormal   Collection Time   12/10/11  5:40 PM      Component Value Range Comment   Squamous Epithelial / LPF RARE  RARE    WBC, UA 0-2  <3 WBC/hpf    RBC / HPF 0-2  <3 RBC/hpf    Bacteria, UA MANY (*) RARE    Casts GRANULAR CAST (*) NEGATIVE    POCT I-STAT 3, BLOOD GAS (G3+)     Status: Abnormal   Collection Time   12/10/11  5:50 PM      Component Value Range Comment   pH, Arterial 7.497 (*) 7.350 - 7.450    pCO2 arterial 35.9  35.0 - 45.0 mmHg    pO2, Arterial 38.0 (*) 80.0 - 100.0 mmHg    Bicarbonate 27.9 (*) 20.0 - 24.0 mEq/L    TCO2 29  0 - 100 mmol/L    O2 Saturation 78.0      Acid-Base Excess 4.0 (*) 0.0 - 2.0 mmol/L    Patient temperature 98.2 F      Collection site RADIAL, ALLEN'S TEST ACCEPTABLE      Drawn by RT      Sample type ARTERIAL      Comment MD NOTIFIED, SUGGEST RECOLLECT     POCT I-STAT 3, BLOOD GAS (G3+)     Status: Abnormal   Collection Time   12/10/11  6:01 PM      Component Value Range Comment   pH, Arterial 7.335 (*) 7.350 - 7.450    pCO2 arterial 50.8 (*) 35.0 - 45.0 mmHg    pO2, Arterial 100.0  80.0 - 100.0 mmHg    Bicarbonate 27.1 (*) 20.0 - 24.0 mEq/L    TCO2 29  0 - 100 mmol/L    O2 Saturation 97.0      Acid-Base Excess 1.0  0.0 - 2.0 mmol/L    Patient temperature 98.3 F      Collection site RADIAL, ALLEN'S TEST ACCEPTABLE      Drawn by RT      Sample type ARTERIAL     GLUCOSE, CAPILLARY     Status: Normal   Collection Time   12/10/11 11:36 PM      Component Value Range Comment   Glucose-Capillary 92  70 - 99 mg/dL   CBC     Status: Abnormal   Collection Time   12/11/11  1:00 AM      Component Value Range Comment   WBC 4.7  4.0 - 10.5 K/uL    RBC 3.95 (*) 4.22 - 5.81 MIL/uL    Hemoglobin 12.4 (*) 13.0 - 17.0 g/dL    HCT 16.1 (*) 09.6 - 52.0 %    MCV 91.4  78.0 - 100.0 fL    MCH 31.4  26.0 - 34.0 pg    MCHC 34.3  30.0 - 36.0 g/dL    RDW 04.5  40.9 - 81.1 %    Platelets 262  150 - 400 K/uL     Dg Chest 2 View  12/10/2011  *RADIOLOGY REPORT*  Clinical Data: Weakness, decreased ability to use legs, history of multiple sclerosis, hypertension, diabetes  CHEST - 2 VIEW  Comparison: 02/15/2011  Findings: Upper normal heart size. Mediastinal contours and pulmonary  vascularity normal.  Lungs clear. No pleural effusion or pneumothorax. Multilevel endplate spur formation thoracic spine.  IMPRESSION: No acute abnormalities.  Original Report Authenticated By: Lollie Marrow, M.D.   Ct Head Wo Contrast  12/10/2011  *RADIOLOGY REPORT*  Clinical Data: Weakness.  CT HEAD WITHOUT CONTRAST  Technique:  Contiguous axial images were obtained from the base of the skull through the vertex without contrast.  Comparison: 02/15/2011  Findings: Again noted are scattered areas of low density in the subcortical white matter, particularly involving the left frontal lobe.  These findings have not significantly changed.  Normal appearance of the ventricles and basal cisterns.  No evidence for acute hemorrhage, mass lesion, midline shift, hydrocephalus or large infarct.  The visualized paranasal sinuses are clear.  No acute bony abnormalities.  IMPRESSION: Stable head CT findings.  No acute intracranial abnormalities.  Stable nonspecific white matter changes.  Original Report Authenticated By: Richarda Overlie, M.D.    @ROS @  Patient denies headache, visual disturbance currently, did have blurred vision earlier.  The patient denies problems with chewing or swallowing.  The patient denies neck or low back pain.  The patient denies shortness of breath, chest pain, abdominal pain. The patient did have some nausea and vomiting the day prior to admission.  The patient has had difficulty voiding the bladder.  The patient claims to have weakness of the lower extremities, no significant numbness.  The patient denies dizziness or loss of consciousness.     Blood pressure 172/98, pulse 86, temperature 98.7 F (37.1 C), temperature source Oral, resp. rate 20, height 5\' 7"  (1.702 m), weight 74.4 kg (164 lb 0.4 oz), SpO2 100.00%.  Physical Examination:  The patient is alert and cooperative at the time of the examination. The patient is minimally to moderately obese.  Respiratory examination is  clear.  Cardiovascular examination reveals a regular rate rhythm, no obvious murmurs or rubs are noted.  Extremities are without significant edema.  Extraocular movements are full. Pupils are equal, round, and react to light. Speech is normal. The patient has good facial symmetry.  Motor testing of the upper extremities is full and normal. With the lower extremities, the patient has heightened motor tone.  The patient has good finger-nose-finger, unable to perform heel-to-shin on either side.  The patient has good pinprick, soft touch, and vibration sensation throughout  Gait could not be tested.  Deep tendon reflexes are relatively symmetric. Toes are neutral bilaterally.     Assessment/Plan:  1. Multiple sclerosis  2. Gait disorder, spastic paraparesis  3. Episodic confusion  4. Diabetes  5. Hypertension  This patient has multiple sclerosis, and he has had a recent change in his ability to ambulate. The patient has significant spasticity of the lower extremities, and a history of bladder dysfunction. These symptoms are referable to a spinal cord lesion. The patient however, also presented with some confusion. The patient is not confused at present. Further workup is indicated, and the patient should be treated as a presumed MS exacerbation.  -Solu-Medrol, 500 mg daily for 3 days -Physical and occupational therapy evaluation -MRI scan of the brain, cervical spine, and thoracic spine with and without gadolinium. -We will follow clinical course -Carbamazepine level   Roxan Yamamoto KEITH 12/11/2011, 1:22 AM

## 2011-12-12 DIAGNOSIS — S24103A Unspecified injury at T7-T10 level of thoracic spinal cord, initial encounter: Secondary | ICD-10-CM

## 2011-12-12 DIAGNOSIS — E118 Type 2 diabetes mellitus with unspecified complications: Secondary | ICD-10-CM

## 2011-12-12 DIAGNOSIS — E1165 Type 2 diabetes mellitus with hyperglycemia: Secondary | ICD-10-CM

## 2011-12-12 DIAGNOSIS — IMO0002 Reserved for concepts with insufficient information to code with codable children: Secondary | ICD-10-CM

## 2011-12-12 DIAGNOSIS — I10 Essential (primary) hypertension: Secondary | ICD-10-CM

## 2011-12-12 DIAGNOSIS — Z87828 Personal history of other (healed) physical injury and trauma: Secondary | ICD-10-CM | POA: Diagnosis present

## 2011-12-12 DIAGNOSIS — R404 Transient alteration of awareness: Secondary | ICD-10-CM

## 2011-12-12 HISTORY — DX: Unspecified injury at T7-t10 level of thoracic spinal cord, initial encounter: S24.103A

## 2011-12-12 LAB — URINE CULTURE

## 2011-12-12 MED ORDER — PREDNISONE 20 MG PO TABS
40.0000 mg | ORAL_TABLET | Freq: Every day | ORAL | Status: DC
  Filled 2011-12-12: qty 2

## 2011-12-12 MED ORDER — PREDNISONE 20 MG PO TABS: 40.0000 mg | ORAL_TABLET | Freq: Every day | ORAL | Status: AC

## 2011-12-12 NOTE — Progress Notes (Signed)
Subjective: He feels like his strength is back to baseline, he wants to go home.  Objective: Vital signs in last 24 hours: Filed Vitals:   12/11/11 1430 12/11/11 2100 12/12/11 0500 12/12/11 1102  BP: 159/89 149/96 166/92 158/88  Pulse: 93 103 80 92  Temp: 98.6 F (37 C) 98.8 F (37.1 C) 98.7 F (37.1 C) 98.3 F (36.8 C)  TempSrc: Oral Oral Oral Oral  Resp: 18 18 18 20   Height:      Weight:      SpO2: 99% 100% 100% 100%   Weight change:   Intake/Output Summary (Last 24 hours) at 12/12/11 1452 Last data filed at 12/12/11 1248  Gross per 24 hour  Intake   2685 ml  Output   1925 ml  Net    760 ml    Physical Exam: General: Awake, Oriented, No acute distress. HEENT: EOMI. Neck: Supple CV: S1 and S2 Lungs: Clear to ascultation bilaterally Abdomen: Soft, Nontender, Nondistended, +bowel sounds. Ext: Good pulses. Trace edema.  Lab Results: Basic Metabolic Panel:  Lab 12/11/11 5409 12/10/11 1330  NA 143 140  K 3.8 3.8  CL 104 98  CO2 28 30  GLUCOSE 107* 154*  BUN 17 33*  CREATININE 0.89 1.80*  CALCIUM 9.8 10.4  MG -- --  PHOS -- --   Liver Function Tests:  Lab 12/10/11 1330  AST 38*  ALT 19  ALKPHOS 76  BILITOT 0.2*  PROT 7.1  ALBUMIN 4.6   No results found for this basename: LIPASE:5,AMYLASE:5 in the last 168 hours No results found for this basename: AMMONIA:5 in the last 168 hours CBC:  Lab 12/11/11 0100 12/10/11 1330  WBC 4.7 5.1  NEUTROABS -- 4.0  HGB 12.4* 11.8*  HCT 36.1* 34.0*  MCV 91.4 91.6  PLT 262 257   Cardiac Enzymes:  Lab 12/11/11 0100 12/10/11 1330  CKTOTAL 565* 632*  CKMB 10.9* 12.5*  CKMBINDEX -- --  TROPONINI <0.30 <0.30   BNP (last 3 results) No results found for this basename: PROBNP:3 in the last 8760 hours CBG:  Lab 12/12/11 1141 12/12/11 0730 12/11/11 2128 12/11/11 1628 12/11/11 1146  GLUCAP 151* 111* 190* 155* 146*   No results found for this basename: HGBA1C:5 in the last 72 hours Other Labs: No components  found with this basename: POCBNP:3 No results found for this basename: DDIMER:2 in the last 168 hours No results found for this basename: CHOL:2,HDL:2,LDLCALC:2,TRIG:2,CHOLHDL:2,LDLDIRECT:2 in the last 168 hours No results found for this basename: TSH,T4TOTAL,FREET3,T3FREE,FREET4,THYROIDAB in the last 168 hours No results found for this basename: VITAMINB12:2,FOLATE:2,FERRITIN:2,TIBC:2,IRON:2,RETICCTPCT:2 in the last 168 hours  Micro Results: Recent Results (from the past 240 hour(s))  URINE CULTURE     Status: Normal   Collection Time   12/11/11  5:30 AM      Component Value Range Status Comment   Specimen Description URINE, RANDOM   Final    Special Requests NONE   Final    Culture  Setup Time 12/11/2011 14:13   Final    Colony Count 9,000 COLONIES/ML   Final    Culture INSIGNIFICANT GROWTH   Final    Report Status 12/12/2011 FINAL   Final     Studies/Results: Dg Chest 2 View  12/10/2011  *RADIOLOGY REPORT*  Clinical Data: Weakness, decreased ability to use legs, history of multiple sclerosis, hypertension, diabetes  CHEST - 2 VIEW  Comparison: 02/15/2011  Findings: Upper normal heart size. Mediastinal contours and pulmonary vascularity normal. Lungs clear. No pleural effusion or pneumothorax.  Multilevel endplate spur formation thoracic spine.  IMPRESSION: No acute abnormalities.  Original Report Authenticated By: Lollie Marrow, M.D.   Ct Head Wo Contrast  12/10/2011  *RADIOLOGY REPORT*  Clinical Data: Weakness.  CT HEAD WITHOUT CONTRAST  Technique:  Contiguous axial images were obtained from the base of the skull through the vertex without contrast.  Comparison: 02/15/2011  Findings: Again noted are scattered areas of low density in the subcortical white matter, particularly involving the left frontal lobe.  These findings have not significantly changed.  Normal appearance of the ventricles and basal cisterns.  No evidence for acute hemorrhage, mass lesion, midline shift, hydrocephalus or  large infarct.  The visualized paranasal sinuses are clear.  No acute bony abnormalities.  IMPRESSION: Stable head CT findings.  No acute intracranial abnormalities.  Stable nonspecific white matter changes.  Original Report Authenticated By: Richarda Overlie, M.D.   Mr Laqueta Jean Wo Contrast  12/12/2011  *RADIOLOGY REPORT*  Clinical Data: Long history of MS with slowly progressive spastic paraparesis. Hypertension and diabetes.  Dementia.  MRI HEAD WITHOUT AND WITH CONTRAST  Technique:  Multiplanar, multiecho pulse sequences of the brain and surrounding structures were obtained according to standard protocol without and with intravenous contrast  Contrast: 15mL MULTIHANCE GADOBENATE DIMEGLUMINE 529 MG/ML IV SOLN  Comparison: MRI brain 02/06/2011  Findings: There is no evidence for acute infarction, intracranial hemorrhage, mass lesion, hydrocephalus, or extra-axial fluid. Subcortical and periventricular white matter signal abnormalities are nonspecific but consistent with the clinical history of multiple sclerosis.  However, the patient also has diabetes and hypertension and a component of chronic microvascular ischemic change may be present.  There are no foci of chronic hemorrhage. Post infusion, there is no abnormal enhancement.  Pituitary and cerebellar tonsils are unremarkable.  Compared with priors, a similar appearance is noted.  IMPRESSION: No acute stroke or bleed.  Subcortical and periventricular white matter signal abnormality is nonspecific and could represent a demyelinating process given history of multiple sclerosis, although chronic microvascular ischemic change in this patient with hypertension and diabetes is not excluded.  No abnormal intracranial enhancement postcontrast.  Original Report Authenticated By: Elsie Stain, M.D.   Mr Cervical Spine W Wo Contrast  12/12/2011  *RADIOLOGY REPORT*  Clinical Data: History of multiple sclerosis.  Spastic paraparesis of the legs with some bladder difficulty.   No reported arm involvement.  MRI CERVICAL SPINE WITHOUT AND WITH CONTRAST  Technique:  Multiplanar and multiecho pulse sequences of the cervical spine, to include the craniocervical junction and cervicothoracic junction, were obtained according to standard protocol without and with intravenous contrast.  Contrast: 15mL MULTIHANCE GADOBENATE DIMEGLUMINE 529 MG/ML IV SOLN  Comparison: None.  Findings: Anatomic alignment.  Disc space narrowing C4-5, C5-6, and C6-7.  There is slightly heterogeneous marrow signal likely relates to aging and/or chronic disease.  No abnormal post contrast enhancement.  No abnormal cord signal to suggest multiple sclerosis.  The individual disc spaces were examined as follows:  C2-3:  Mild bulge.  C3-4:  Central protrusion with facet arthropathy.  Mild cord flattening.  Bilateral C4 nerve root encroachment.  C4-5:  Central protrusion with central osteophyte formation and bilateral uncinate spurring.  Mild to moderate cord flattening. Bilateral C5 nerve root encroachment worse on the left.  Canal diameter 5 mm.  No abnormal cord signal.  C5-6:  Central disc protrusion with osteophyte formation and bilateral uncinate spurring.  Bilateral facet arthropathy. Moderate cord flattening without abnormal cord signal.  Canal diameter 5 mm.  Bilateral  C6 nerve root encroachment.  C6-7:  Shallow central protrusion.  Effacement of the anterior subarachnoid space without cord compression.  Left-sided uncinate spurring with left C7 nerve root encroachment.  C7-T1:  Central and rightward protrusion.  Mild facet arthropathy. Mild cord flattening.  Canal diameter 7 mm.  Bilateral C8 nerve root encroachment, worse on the right.  IMPRESSION: Multilevel spondylosis as described.  Significant cord compression is observed at C4-5 and C5-6.  No abnormal cord signal is observed to suggest cervical MS.  Original Report Authenticated By: Elsie Stain, M.D.   Mr Thoracic Spine W Wo Contrast  12/12/2011   *RADIOLOGY REPORT*  Clinical Data: Long history of MS with slowly progressing spastic paraparesis.  MRI THORACIC SPINE WITHOUT AND WITH CONTRAST  Technique:  Multiplanar and multiecho pulse sequences of the thoracic spine were obtained without and with intravenous contrast.  Contrast: 15mL MULTIHANCE GADOBENATE DIMEGLUMINE 529 MG/ML IV SOLN  Comparison: None.  Findings: Numbering of the thoracic spine was performed by counting down from  the odontoid. There is fusion of T7 and T8.  It is unclear if this is congenital or related to previous trauma.  The disc space is obliterated.  There is slight wedging of both vertebral bodies without retropulsion.  There is no abnormal post contrast enhancement.  The marrow signal is slightly heterogeneous consistent with advanced age or chronic disease. There are no foci of abnormal cord signal to suggest multiple sclerosis except focally T10-11, described below.  There has been previous T9-T11 laminectomy.  This was likely for disc disease.  At T1-2 and T2-3, there is advanced posterior element hypertrophy which mildly narrows the spinal canal but does not result in neural compression.  At T8-9 there is asymmetric hypertrophy of the ligamentum flavum on the left which mildly flattens the cord.  There is no critical stenosis however.  At T10-11, there is a central disc protrusion which is partially calcified associated with regrowth of posterior elements including facets and ligamentum flava which results in significant canal stenosis.  There is abnormal cord signal at this level which could represent acute or chronic myelomalacia. Multiple sclerosis is not favored, given the focality and lack of thoracic cord lesions elsewhere.  At T11-T12, there is similar but smaller central protrusion with regrowth of posterior elements including facets and ligamentum flava.  There is no cord compression however.  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  MS is not favored.  Chronic deformity T7 and T8, congenital or post traumatic fusion with slight wedging.  Moderate posterior element hypertrophy left T8-9 with slight cord flattening but no critical stenosis.  Findings discussed with Dr. Otelia Limes  Original Report Authenticated By: Elsie Stain, M.D.    Medications: I have reviewed the patient's current medications. Scheduled Meds:    . amitriptyline  25 mg Oral QHS  . aspirin  81 mg Oral Daily  . baclofen  60 mg Oral q morning - 10a  . baclofen  80 mg Oral QPM  . carbamazepine  100 mg Oral BID  . enoxaparin (LOVENOX) injection  40 mg Subcutaneous Q24H  . gabapentin  1,600 mg Oral QHS  . gabapentin  800 mg Oral Daily  . insulin aspart  0-9 Units Subcutaneous TID WC  . lisinopril  20 mg Oral Daily  . pioglitazone  30 mg Oral QAC  breakfast  . predniSONE  40 mg Oral Q breakfast  . DISCONTD: baclofen  20 mg Oral BID  . DISCONTD: gabapentin  1,600 mg Oral QPM  . DISCONTD: gabapentin  800 mg Oral BID  . DISCONTD: gabapentin  800 mg Oral q morning - 10a  . DISCONTD: methylPREDNISolone (SOLU-MEDROL) injection  500 mg Intravenous Daily   Continuous Infusions:    . DISCONTD: sodium chloride 75 mL/hr at 12/11/11 0114   PRN Meds:.acetaminophen, acetaminophen, gadobenate dimeglumine, tiZANidine, traMADol  Assessment/Plan: Altered mental status/acute delirium/encephalopathy with lower extremity weakness Etiology unclear, resolved.  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  Discussed with neurology, neurology believes that patient may have been misdiagnosed with MS.  No PT needs seen by  PT.  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 Also has advanced spondylitic stenosis at C4-5 and C5-6 Neurosurgery Dr. Danielle Dess, recommended inpatient surgery. Patient wanted to have surgery electively as outpatient. Per Dr. Danielle Dess will discharge the patient on prednisone 40 mg daily until outpatient surgery.  DM2 uncontrolled with complications Continue pioglitazone, resume metformin at discharge.  Sliding scale insulin  Mildly elevated CK/CKMB Index and troponin are normal. This may be related to mild rhabdo from muscle injury/crawling episodes over last 48 hours.   Urinary retention likely from T10-T11 cord compression  Urine analysis negative for urinary tract infection, urine culture sent and pending.  Hypertension Stable.  Continue lisinopril.  Prophylaxis Lovenox.  Disposition Discharge the patient today.   LOS: 2 days  Zeda Gangwer A, MD 12/12/2011, 2:52 PM

## 2011-12-12 NOTE — Consult Note (Signed)
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 Randel [name] Larry [name] is an 61 y.o. male.  HPI: Patient is a 61 year old individual who's had a long-standing history of multiple sclerosis. 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 He states that he underwent surgery for his thoracic disc herniation but since that time has had difficulty with his walking he was told that he had multiple sclerosis. He notes that when he first came to [place] in the late 90s he could walk with a singular cane and he required 2 canes and for the last few years he's required the use of [place] crutches. He relates a story that in the past week he had an episode where he could not walk at all followed by a period of rest where he could walk in his usual state for several hours this occurred on Monday then again on Wednesday and then finally on Thursday when he could not walk or move from his bed they summoned the EMS which brought him to the hospital. 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  Past Medical History  Diagnosis Date  . MS (multiple sclerosis)   . Diabetes mellitus   . Hypertension   . Dementia   . Abnormality of gait   . Acute delirium 12/11/2011    Past Surgical History  Procedure Date  . Lumbar laminectomy   . Back surgery     History reviewed. No pertinent family history.  Social History:  reports that he has never smoked. He does not have any smokeless tobacco  history on file. He reports that he does not drink alcohol or use illicit drugs.  Allergies: No Known Allergies  Medications: I have reviewed the patient's current medications.  Results for orders placed during the hospital encounter of 12/10/11 (from the past 48 hour(s))  CARBAMAZEPINE LEVEL, TOTAL     Status: Abnormal   Collection Time   12/10/11  4:00 PM      Component Value Range Comment   Carbamazepine Lvl 3.1 (*) 4.0 - 12.0 ug/mL   URINALYSIS, ROUTINE W REFLEX MICROSCOPIC     Status: Abnormal   Collection Time   12/10/11  5:40 PM      Component Value Range Comment   Color, Urine YELLOW  YELLOW    APPearance CLOUDY (*) CLEAR    Specific Gravity, Urine 1.021  1.005 - 1.030    pH 5.0  5.0 - 8.0    Glucose, UA NEGATIVE  NEGATIVE mg/dL    Hgb urine dipstick NEGATIVE  NEGATIVE    Bilirubin Urine NEGATIVE  NEGATIVE    Ketones, ur NEGATIVE  NEGATIVE mg/dL    Protein, ur 30 (*) NEGATIVE mg/dL    Urobilinogen, UA 0.2  0.0 - 1.0 mg/dL    Nitrite NEGATIVE  NEGATIVE    Leukocytes, UA NEGATIVE  NEGATIVE   URINE MICROSCOPIC-ADD ON     Status: Abnormal   Collection Time   12/10/11  5:40 PM      Component Value Range Comment   Squamous Epithelial / LPF RARE  RARE    WBC, UA 0-2  <3 WBC/hpf    RBC / HPF 0-2  <3 RBC/hpf    Bacteria, UA MANY (*) RARE    Casts GRANULAR CAST (*) NEGATIVE   POCT I-STAT 3, BLOOD GAS (G3+)     Status: Abnormal   Collection Time   12/10/11  5:50 PM      Component Value Range Comment   pH, Arterial 7.497 (*) 7.350 - 7.450    pCO2 arterial 35.9  35.0 - 45.0 mmHg    pO2, Arterial 38.0 (*) 80.0 - 100.0 mmHg    Bicarbonate 27.9 (*) 20.0 - 24.0 mEq/L    TCO2 29  0 - 100 mmol/L    O2 Saturation 78.0      Acid-Base Excess 4.0 (*) 0.0 - 2.0 mmol/L    Patient temperature 98.2 F      Collection site RADIAL, ALLEN'S TEST ACCEPTABLE      Drawn by RT      Sample type ARTERIAL      Comment MD NOTIFIED, SUGGEST RECOLLECT     POCT I-STAT 3, BLOOD GAS (G3+)     Status:  Abnormal   Collection Time   12/10/11  6:01 PM      Component Value Range Comment   pH, Arterial 7.335 (*) 7.350 - 7.450    pCO2 arterial 50.8 (*) 35.0 - 45.0 mmHg    pO2, Arterial 100.0  80.0 - 100.0 mmHg    Bicarbonate 27.1 (*) 20.0 - 24.0 mEq/L    TCO2 29  0 - 100 mmol/L    O2 Saturation 97.0      Acid-Base Excess 1.0  0.0 - 2.0 mmol/L    Patient temperature 98.3 F      Collection site RADIAL, ALLEN'S TEST ACCEPTABLE      Drawn by RT      Sample type ARTERIAL     GLUCOSE, CAPILLARY     Status: Normal   Collection Time   12/10/11 11:36 PM      Component Value Range Comment   Glucose-Capillary 92  70 - 99 mg/dL   CARDIAC PANEL(CRET KIN+CKTOT+MB+TROPI)     Status: Abnormal   Collection Time   12/11/11  1:00 AM      Component Value Range Comment   Total CK 565 (*) 7 - 232 U/L    CK, MB 10.9 (*) 0.3 - 4.0 ng/mL    Troponin I <0.30  <0.30 ng/mL    Relative Index 1.9  0.0 - 2.5   CBC     Status: Abnormal   Collection Time   12/11/11  1:00 AM      Component Value Range Comment   WBC 4.7  4.0 - 10.5 K/uL    RBC 3.95 (*) 4.22 - 5.81 MIL/uL    Hemoglobin 12.4 (*) 13.0 - 17.0 g/dL    HCT 16.1 (*) 09.6 - 52.0 %    MCV 91.4  78.0 - 100.0 fL    MCH 31.4  26.0 - 34.0 pg    MCHC 34.3  30.0 - 36.0 g/dL    RDW 04.5  40.9 - 81.1 %    Platelets 262  150 - 400 K/uL   BASIC METABOLIC PANEL     Status: Abnormal   Collection Time   12/11/11  1:00 AM      Component Value Range Comment  Sodium 143  135 - 145 mEq/L    Potassium 3.8  3.5 - 5.1 mEq/L    Chloride 104  96 - 112 mEq/L    CO2 28  19 - 32 mEq/L    Glucose, Bld 107 (*) 70 - 99 mg/dL    BUN 17  6 - 23 mg/dL    Creatinine, Ser 4.09  0.50 - 1.35 mg/dL    Calcium 9.8  8.4 - 81.1 mg/dL    GFR calc non Af Amer 89 (*) >90 mL/min    GFR calc Af Amer >90  >90 mL/min   RPR     Status: Normal   Collection Time   12/11/11  2:10 AM      Component Value Range Comment   RPR NON REACTIVE  NON REACTIVE   HIV ANTIBODY (ROUTINE TESTING)      Status: Normal   Collection Time   12/11/11  2:10 AM      Component Value Range Comment   HIV NON REACTIVE  NON REACTIVE   URINE CULTURE     Status: Normal   Collection Time   12/11/11  5:30 AM      Component Value Range Comment   Specimen Description URINE, RANDOM      Special Requests NONE      Culture  Setup Time 12/11/2011 14:13      Colony Count 9,000 COLONIES/ML      Culture INSIGNIFICANT GROWTH      Report Status 12/12/2011 FINAL     GLUCOSE, CAPILLARY     Status: Normal   Collection Time   12/11/11  7:34 AM      Component Value Range Comment   Glucose-Capillary 98  70 - 99 mg/dL    Comment 1 Notify RN     GLUCOSE, CAPILLARY     Status: Abnormal   Collection Time   12/11/11 11:46 AM      Component Value Range Comment   Glucose-Capillary 146 (*) 70 - 99 mg/dL    Comment 1 Notify RN     GLUCOSE, CAPILLARY     Status: Abnormal   Collection Time   12/11/11  4:28 PM      Component Value Range Comment   Glucose-Capillary 155 (*) 70 - 99 mg/dL    Comment 1 Notify RN     GLUCOSE, CAPILLARY     Status: Abnormal   Collection Time   12/11/11  9:28 PM      Component Value Range Comment   Glucose-Capillary 190 (*) 70 - 99 mg/dL    Comment 1 Notify RN     GLUCOSE, CAPILLARY     Status: Abnormal   Collection Time   12/12/11  7:30 AM      Component Value Range Comment   Glucose-Capillary 111 (*) 70 - 99 mg/dL    Comment 1 Notify RN     GLUCOSE, CAPILLARY     Status: Abnormal   Collection Time   12/12/11 11:41 AM      Component Value Range Comment   Glucose-Capillary 151 (*) 70 - 99 mg/dL    Comment 1 Notify RN       Dg Chest 2 View  12/10/2011  *RADIOLOGY REPORT*  Clinical Data: Weakness, decreased ability to use legs, history of multiple sclerosis, hypertension, diabetes  CHEST - 2 VIEW  Comparison: 02/15/2011  Findings: Upper normal heart size. Mediastinal contours and pulmonary vascularity normal. Lungs clear. No pleural effusion or pneumothorax. Multilevel endplate spur  formation  thoracic spine.  IMPRESSION: No acute abnormalities.  Original Report Authenticated By: Lollie Marrow, M.D.   Ct Head Wo Contrast  12/10/2011  *RADIOLOGY REPORT*  Clinical Data: Weakness.  CT HEAD WITHOUT CONTRAST  Technique:  Contiguous axial images were obtained from the base of the skull through the vertex without contrast.  Comparison: 02/15/2011  Findings: Again noted are scattered areas of low density in the subcortical white matter, particularly involving the left frontal lobe.  These findings have not significantly changed.  Normal appearance of the ventricles and basal cisterns.  No evidence for acute hemorrhage, mass lesion, midline shift, hydrocephalus or large infarct.  The visualized paranasal sinuses are clear.  No acute bony abnormalities.  IMPRESSION: Stable head CT findings.  No acute intracranial abnormalities.  Stable nonspecific white matter changes.  Original Report Authenticated By: Richarda Overlie, M.D.   Mr Laqueta Jean Wo Contrast  12/12/2011  *RADIOLOGY REPORT*  Clinical Data: Long history of MS with slowly progressive spastic paraparesis. Hypertension and diabetes.  Dementia.  MRI HEAD WITHOUT AND WITH CONTRAST  Technique:  Multiplanar, multiecho pulse sequences of the brain and surrounding structures were obtained according to standard protocol without and with intravenous contrast  Contrast: 15mL MULTIHANCE GADOBENATE DIMEGLUMINE 529 MG/ML IV SOLN  Comparison: MRI brain 02/06/2011  Findings: There is no evidence for acute infarction, intracranial hemorrhage, mass lesion, hydrocephalus, or extra-axial fluid. Subcortical and periventricular white matter signal abnormalities are nonspecific but consistent with the clinical history of multiple sclerosis.  However, the patient also has diabetes and hypertension and a component of chronic microvascular ischemic change may be present.  There are no foci of chronic hemorrhage. Post infusion, there is no abnormal enhancement.  Pituitary and  cerebellar tonsils are unremarkable.  Compared with priors, a similar appearance is noted.  IMPRESSION: No acute stroke or bleed.  Subcortical and periventricular white matter signal abnormality is nonspecific and could represent a demyelinating process given history of multiple sclerosis, although chronic microvascular ischemic change in this patient with hypertension and diabetes is not excluded.  No abnormal intracranial enhancement postcontrast.  Original Report Authenticated By: Elsie Stain, M.D.   Mr Cervical Spine W Wo Contrast  12/12/2011  *RADIOLOGY REPORT*  Clinical Data: History of multiple sclerosis.  Spastic paraparesis of the legs with some bladder difficulty.  No reported arm involvement.  MRI CERVICAL SPINE WITHOUT AND WITH CONTRAST  Technique:  Multiplanar and multiecho pulse sequences of the cervical spine, to include the craniocervical junction and cervicothoracic junction, were obtained according to standard protocol without and with intravenous contrast.  Contrast: 15mL MULTIHANCE GADOBENATE DIMEGLUMINE 529 MG/ML IV SOLN  Comparison: None.  Findings: Anatomic alignment.  Disc space narrowing C4-5, C5-6, and C6-7.  There is slightly heterogeneous marrow signal likely relates to aging and/or chronic disease.  No abnormal post contrast enhancement.  No abnormal cord signal to suggest multiple sclerosis.  The individual disc spaces were examined as follows:  C2-3:  Mild bulge.  C3-4:  Central protrusion with facet arthropathy.  Mild cord flattening.  Bilateral C4 nerve root encroachment.  C4-5:  Central protrusion with central osteophyte formation and bilateral uncinate spurring.  Mild to moderate cord flattening. Bilateral C5 nerve root encroachment worse on the left.  Canal diameter 5 mm.  No abnormal cord signal.  C5-6:  Central disc protrusion with osteophyte formation and bilateral uncinate spurring.  Bilateral facet arthropathy. Moderate cord flattening without abnormal cord signal.   Canal diameter 5 mm.  Bilateral C6 nerve root encroachment.  C6-7:  Shallow central protrusion.  Effacement of the anterior subarachnoid space without cord compression.  Left-sided uncinate spurring with left C7 nerve root encroachment.  C7-T1:  Central and rightward protrusion.  Mild facet arthropathy. Mild cord flattening.  Canal diameter 7 mm.  Bilateral C8 nerve root encroachment, worse on the right.  IMPRESSION: Multilevel spondylosis as described.  Significant cord compression is observed at C4-5 and C5-6.  No abnormal cord signal is observed to suggest cervical MS.  Original Report Authenticated By: Elsie Stain, M.D.   Mr Thoracic Spine W Wo Contrast  12/12/2011  *RADIOLOGY REPORT*  Clinical Data: Long history of MS with slowly progressing spastic paraparesis.  MRI THORACIC SPINE WITHOUT AND WITH CONTRAST  Technique:  Multiplanar and multiecho pulse sequences of the thoracic spine were obtained without and with intravenous contrast.  Contrast: 15mL MULTIHANCE GADOBENATE DIMEGLUMINE 529 MG/ML IV SOLN  Comparison: None.  Findings: Numbering of the thoracic spine was performed by counting down from  the odontoid. There is fusion of T7 and T8.  It is unclear if this is congenital or related to previous trauma.  The disc space is obliterated.  There is slight wedging of both vertebral bodies without retropulsion.  There is no abnormal post contrast enhancement.  The marrow signal is slightly heterogeneous consistent with advanced age or chronic disease. There are no foci of abnormal cord signal to suggest multiple sclerosis except focally T10-11, described below.  There has been previous T9-T11 laminectomy.  This was likely for disc disease.  At T1-2 and T2-3, there is advanced posterior element hypertrophy which mildly narrows the spinal canal but does not result in neural compression.  At T8-9 there is asymmetric hypertrophy of the ligamentum flavum on the left which mildly flattens the cord.  There is no  critical stenosis however.  At T10-11, there is a central disc protrusion which is partially calcified associated with regrowth of posterior elements including facets and ligamentum flava which results in significant canal stenosis.  There is abnormal cord signal at this level which could represent acute or chronic myelomalacia. Multiple sclerosis is not favored, given the focality and lack of thoracic cord lesions elsewhere.  At T11-T12, there is similar but smaller central protrusion with regrowth of posterior elements including facets and ligamentum flava.  There is no cord compression however.  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  MS is not favored.  Chronic deformity T7 and T8, congenital or post traumatic fusion with slight wedging.  Moderate posterior element hypertrophy left T8-9 with slight cord flattening but no critical stenosis.  Findings discussed with Dr. Otelia Limes  Original Report Authenticated By: Elsie Stain, M.D.    Review of Systems  Constitutional: Positive for malaise/fatigue.  HENT: Negative.   Eyes: Negative.   Respiratory: Negative.   Cardiovascular: Negative.   Gastrointestinal: Negative.   Genitourinary: Positive for urgency and frequency.  Musculoskeletal: Positive for falls.  Skin: Negative.   Neurological: Positive for focal weakness and weakness.       Episodic inability to walk. Best walking has been a spastic paraparesis for years  Endo/Heme/Allergies: Negative.   Psychiatric/Behavioral:       Daughter notes the patient was delusional and somewhat irrational in days prior to admission   Blood pressure 158/88, pulse 83, temperature 98.7 F (37.1 C), temperature source Oral, resp.  rate 18, height 5\' 7"  (1.702 m), weight 74.4 kg (164  lb 0.4 oz), SpO2 100.00%. Physical Exam  Constitutional: He is oriented to person, place, and time. He appears well-developed and well-nourished.  HENT:  Head: Normocephalic and atraumatic.  Eyes: EOM are normal.  Neck: Normal range of motion. Neck supple.  Cardiovascular: Normal rate and regular rhythm.   Respiratory: Effort normal and breath sounds normal.  GI: Soft. Bowel sounds are normal.  Musculoskeletal:       Spastic paraparesis and lower extremities with very limited proximal leg strength in iliopsoas and quadriceps being graded at 3/5 distal strength is graded at 4/5 in tibialis anterior and gastrocs however with market spasticity  Neurological: He is alert and oriented to person, place, and time. He displays abnormal reflex. No cranial nerve deficit. Coordination abnormal.  Skin: Skin is warm and dry.  Psychiatric: He has a normal mood and affect. His behavior is normal. Judgment and thought content normal.    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 Patient also has advanced spondylitic stenosis at C4-5 and C5-6 however he denies any upper extremity symptoms and his exam demonstrates that he does indeed have good upper extremity strength tone and bulk.  I have discussed the anatomy and the pathology with the patient and his daughter at length. The patient is reluctant to consider surgical intervention apparently his previous surgery took quite some time to recover. Nonetheless after explaining the anatomy to the patient he is willing to proceed with surgical intervention. In light of the fact that he is improved on steroids the surgery can be performed electively. This delighted the patient as he was eager to be discharged from the hospital now he's feeling better. I cautioned him that if recurrent episodes of weakness occur he may become more difficult  for him to recover. I believe the patient can be discharged on a modest dose of prednisone such as 30-40 mg per day and we can schedule his surgery electively in the next week to 2 weeks' period of time. I spent approximately 45 minutes with the patient and daughter going over the films discussing the surgery both of the thoracic spine and of the cervical spine the importance and the need for the decompression and the risks of the surgery which include paralysis itself. We will contact the patient as an outpatient to schedule surgical intervention.   Burlon Centrella J 12/12/2011, 2:29 PM

## 2011-12-12 NOTE — Discharge Summary (Signed)
Physician Discharge Summary  Larry [name] RUE:[id number] DOB: [birth year] DOA: 12/10/2011  PCP: Karie Chimera, MD  Admit date: 12/10/2011 Discharge date: 12/12/2011  Recommendations for Outpatient Follow-up:  1. Followup with Dr. Danielle Dess (neurosurgery) in 1 week for consideration of surgery 2. Followup with REESE,BETTI D, MD (PCP) in 1 week. 3. Followup with Dr. Terrace Arabia (his neurologist) as outpatient.  Discharge Diagnoses:  Principal Problem:  *T10 spinal cord injury Active Problems:  Altered mental status  DM (diabetes mellitus), type 2, uncontrolled with complications  Hypertension  Urinary retention  Acute delirium  Discharge Condition: Stable  Diet recommendation: Diabetic diet  History of present illness:  On admission "Mr. Bobier is a 61 year old male with long-standing history of MS, ambulates using crutches, presented to ER at [facility] with intermittent confusion and lower extremity weakness that started yesterday(Wednesday)  Patient reports that Tuesday he felt fine, was able to do all his ADLs, Wednesday he went out with his friend to church and to book his cruise tickets on his way back he didn't feel quite right, when they dropped him home he couldn't quite use his legs to walk and had to crawl his way, and they had to help lift him up, in addition his daughter who lives in [place] came by to check on him since she hadn't heard from him all day, she noted that he was confused that night, but by the next morning (Thursday ) after waking up he was back to his usual self, walked up the stairs, took a shower, his confusion also completely resolved. Then his daughter left to buy some groceries, subsequently came back in the afternoon by then he was confused again and couldn't walk had to crawl his way around."  Hospital Course:  Altered mental status/acute delirium/encephalopathy with lower extremity weakness Etiology unclear, resolved.  MRI of the brain showed no  acute stroke or bleed, MRI of the T-spine showed chronic cord compression and myelomalacia at T10-11, MRI of the C-spine shows significant cord compression at C4-5 and C5-6.  Discussed with neurology, neurology believes that patient may have been misdiagnosed with MS.  No PT needs seen by PT.  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 Also has advanced spondylitic stenosis at C4-5 and C5-6 Neurosurgery Dr. Danielle Dess, recommended inpatient surgery. Patient wanted to have surgery electively as outpatient. Per Dr. Danielle Dess will discharge the patient on prednisone 40 mg daily until outpatient surgery.  DM2 uncontrolled with complications Continue pioglitazone, resume metformin at discharge.  Sliding scale insulin  Mildly elevated CK/CKMB Index and troponin are normal. This may be related to mild rhabdo from muscle injury/crawling episodes over last 48 hours.   Urinary retention likely from T10-T11 cord compression  Urine analysis negative for urinary tract infection, urine culture sent and pending.  Hypertension Stable, mildly elevated during the hospital stay.  Continue lisinopril.  Further titration as outpatient.  Procedures:  MRI of Brain, T-spine, and C-spine.  Consultations:  Dr. Danielle Dess, Neurosurgery  Dr. Otelia Limes, Neurology  Discharge Exam: Filed Vitals:   12/12/11 1102  BP: 158/88  Pulse: 92  Temp: 98.3 F (36.8 C)  Resp: 20   Filed Vitals:   12/11/11 1430 12/11/11 2100 12/12/11 0500 12/12/11 1102  BP: 159/89 149/96 166/92 158/88  Pulse: 93 103 80 92  Temp: 98.6 F (37 C) 98.8 F (37.1 C) 98.7 F (37.1 C) 98.3 F (36.8 C)  TempSrc: Oral  Oral Oral Oral  Resp: 18 18 18 20   Height:      Weight:      SpO2: 99% 100% 100% 100%    Discharge Instructions Discharge Orders    Future Orders Please Complete By Expires   Diet Carb Modified      Increase activity  slowly      Discharge instructions      Comments:   Followup with Dr. Danielle Dess (neurosurgery) in 1 week. Followup with REESE,BETTI D, MD (PCP) in 1-2 week.     Medication List  As of 12/12/2011  3:14 PM   TAKE these medications         amitriptyline 25 MG tablet   Commonly known as: ELAVIL   Take 25 mg by mouth at bedtime.      aspirin 81 MG tablet   Take 81 mg by mouth daily.      baclofen 20 MG tablet   Commonly known as: LIORESAL   Take 20 mg by mouth 3 (three) times daily. And at bedtime as needed      carbamazepine 100 MG chewable tablet   Commonly known as: TEGRETOL   Chew by mouth 2 (two) times daily.      cholecalciferol 1000 UNITS tablet   Commonly known as: VITAMIN D   Take 1,000 Units by mouth daily.      gabapentin 800 MG tablet   Commonly known as: NEURONTIN   Take 800 mg by mouth 2 (two) times daily. And  2 tablets at bedtime      lisinopril 20 MG tablet   Commonly known as: PRINIVIL,ZESTRIL   Take 20 mg by mouth daily.      metFORMIN 1000 MG tablet   Commonly known as: GLUCOPHAGE   Take 1,000 mg by mouth 2 (two) times daily with a meal.      multivitamin tablet   Take 1 tablet by mouth daily.      pioglitazone 30 MG tablet   Commonly known as: ACTOS   Take 30 mg by mouth daily.      predniSONE 20 MG tablet   Commonly known as: DELTASONE   Take 2 tablets (40 mg total) by mouth daily with breakfast. Continue to take until you see Dr. Danielle Dess (neurosurgery), do not stop until directed by neurosurgery.      tiZANidine 4 MG capsule   Commonly known as: ZANAFLEX   Take 2-4 mg by mouth 2 (two) times daily as needed.      traMADol 50 MG tablet   Commonly known as: ULTRAM   Take 50 mg by mouth 4 (four) times daily.           Follow-up Information    Follow up with Stefani Dama, MD. Schedule an appointment as soon as possible for a visit in 1 week.   Contact information:   [address], [address] [place]  [address] [phone]       Follow up with Levert Feinstein, MD. Schedule an appointment as soon as possible for a visit in 1 month.   Contact information:   [address] [place] [address] [phone]       Follow up with Karie Chimera, MD. Schedule an appointment as soon as possible for a visit in 1 week.   Contact information:   [address], [address] [place] [address] [phone]           The results of significant diagnostics  from this hospitalization (including imaging, microbiology, ancillary and laboratory) are listed below for reference.    Significant Diagnostic Studies: Dg Chest 2 View  12/10/2011  *RADIOLOGY REPORT*  Clinical Data: Weakness, decreased ability to use legs, history of multiple sclerosis, hypertension, diabetes  CHEST - 2 VIEW  Comparison: 02/15/2011  Findings: Upper normal heart size. Mediastinal contours and pulmonary vascularity normal. Lungs clear. No pleural effusion or pneumothorax. Multilevel endplate spur formation thoracic spine.  IMPRESSION: No acute abnormalities.  Original Report Authenticated By: Lollie Marrow, M.D.   Ct Head Wo Contrast  12/10/2011  *RADIOLOGY REPORT*  Clinical Data: Weakness.  CT HEAD WITHOUT CONTRAST  Technique:  Contiguous axial images were obtained from the base of the skull through the vertex without contrast.  Comparison: 02/15/2011  Findings: Again noted are scattered areas of low density in the subcortical white matter, particularly involving the left frontal lobe.  These findings have not significantly changed.  Normal appearance of the ventricles and basal cisterns.  No evidence for acute hemorrhage, mass lesion, midline shift, hydrocephalus or large infarct.  The visualized paranasal sinuses are clear.  No acute bony abnormalities.  IMPRESSION: Stable head CT findings.  No acute intracranial abnormalities.  Stable nonspecific white matter changes.  Original Report Authenticated By:  Richarda Overlie, M.D.   Mr Laqueta Jean Wo Contrast  12/12/2011  *RADIOLOGY REPORT*  Clinical Data: Long history of MS with slowly progressive spastic paraparesis. Hypertension and diabetes.  Dementia.  MRI HEAD WITHOUT AND WITH CONTRAST  Technique:  Multiplanar, multiecho pulse sequences of the brain and surrounding structures were obtained according to standard protocol without and with intravenous contrast  Contrast: 15mL MULTIHANCE GADOBENATE DIMEGLUMINE 529 MG/ML IV SOLN  Comparison: MRI brain 02/06/2011  Findings: There is no evidence for acute infarction, intracranial hemorrhage, mass lesion, hydrocephalus, or extra-axial fluid. Subcortical and periventricular white matter signal abnormalities are nonspecific but consistent with the clinical history of multiple sclerosis.  However, the patient also has diabetes and hypertension and a component of chronic microvascular ischemic change may be present.  There are no foci of chronic hemorrhage. Post infusion, there is no abnormal enhancement.  Pituitary and cerebellar tonsils are unremarkable.  Compared with priors, a similar appearance is noted.  IMPRESSION: No acute stroke or bleed.  Subcortical and periventricular white matter signal abnormality is nonspecific and could represent a demyelinating process given history of multiple sclerosis, although chronic microvascular ischemic change in this patient with hypertension and diabetes is not excluded.  No abnormal intracranial enhancement postcontrast.  Original Report Authenticated By: Elsie Stain, M.D.   Mr Cervical Spine W Wo Contrast  12/12/2011  *RADIOLOGY REPORT*  Clinical Data: History of multiple sclerosis.  Spastic paraparesis of the legs with some bladder difficulty.  No reported arm involvement.  MRI CERVICAL SPINE WITHOUT AND WITH CONTRAST  Technique:  Multiplanar and multiecho pulse sequences of the cervical spine, to include the craniocervical junction and cervicothoracic junction, were obtained  according to standard protocol without and with intravenous contrast.  Contrast: 15mL MULTIHANCE GADOBENATE DIMEGLUMINE 529 MG/ML IV SOLN  Comparison: None.  Findings: Anatomic alignment.  Disc space narrowing C4-5, C5-6, and C6-7.  There is slightly heterogeneous marrow signal likely relates to aging and/or chronic disease.  No abnormal post contrast enhancement.  No abnormal cord signal to suggest multiple sclerosis.  The individual disc spaces were examined as follows:  C2-3:  Mild bulge.  C3-4:  Central protrusion with facet arthropathy.  Mild cord flattening.  Bilateral C4 nerve  root encroachment.  C4-5:  Central protrusion with central osteophyte formation and bilateral uncinate spurring.  Mild to moderate cord flattening. Bilateral C5 nerve root encroachment worse on the left.  Canal diameter 5 mm.  No abnormal cord signal.  C5-6:  Central disc protrusion with osteophyte formation and bilateral uncinate spurring.  Bilateral facet arthropathy. Moderate cord flattening without abnormal cord signal.  Canal diameter 5 mm.  Bilateral C6 nerve root encroachment.  C6-7:  Shallow central protrusion.  Effacement of the anterior subarachnoid space without cord compression.  Left-sided uncinate spurring with left C7 nerve root encroachment.  C7-T1:  Central and rightward protrusion.  Mild facet arthropathy. Mild cord flattening.  Canal diameter 7 mm.  Bilateral C8 nerve root encroachment, worse on the right.  IMPRESSION: Multilevel spondylosis as described.  Significant cord compression is observed at C4-5 and C5-6.  No abnormal cord signal is observed to suggest cervical MS.  Original Report Authenticated By: Elsie Stain, M.D.   Mr Thoracic Spine W Wo Contrast  12/12/2011  *RADIOLOGY REPORT*  Clinical Data: Long history of MS with slowly progressing spastic paraparesis.  MRI THORACIC SPINE WITHOUT AND WITH CONTRAST  Technique:  Multiplanar and multiecho pulse sequences of the thoracic spine were obtained without  and with intravenous contrast.  Contrast: 15mL MULTIHANCE GADOBENATE DIMEGLUMINE 529 MG/ML IV SOLN  Comparison: None.  Findings: Numbering of the thoracic spine was performed by counting down from  the odontoid. There is fusion of T7 and T8.  It is unclear if this is congenital or related to previous trauma.  The disc space is obliterated.  There is slight wedging of both vertebral bodies without retropulsion.  There is no abnormal post contrast enhancement.  The marrow signal is slightly heterogeneous consistent with advanced age or chronic disease. There are no foci of abnormal cord signal to suggest multiple sclerosis except focally T10-11, described below.  There has been previous T9-T11 laminectomy.  This was likely for disc disease.  At T1-2 and T2-3, there is advanced posterior element hypertrophy which mildly narrows the spinal canal but does not result in neural compression.  At T8-9 there is asymmetric hypertrophy of the ligamentum flavum on the left which mildly flattens the cord.  There is no critical stenosis however.  At T10-11, there is a central disc protrusion which is partially calcified associated with regrowth of posterior elements including facets and ligamentum flava which results in significant canal stenosis.  There is abnormal cord signal at this level which could represent acute or chronic myelomalacia. Multiple sclerosis is not favored, given the focality and lack of thoracic cord lesions elsewhere.  At T11-T12, there is similar but smaller central protrusion with regrowth of posterior elements including facets and ligamentum flava.  There is no cord compression however.  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  MS is not favored.  Chronic deformity T7 and T8,  congenital or post traumatic fusion with slight wedging.  Moderate posterior element hypertrophy left T8-9 with slight cord flattening but no critical stenosis.  Findings discussed with Dr. Otelia Limes  Original Report Authenticated By: Elsie Stain, M.D.    Microbiology: Recent Results (from the past 240 hour(s))  URINE CULTURE     Status: Normal   Collection Time   12/11/11  5:30 AM      Component Value Range Status  Comment   Specimen Description URINE, RANDOM   Final    Special Requests NONE   Final    Culture  Setup Time 12/11/2011 14:13   Final    Colony Count 9,000 COLONIES/ML   Final    Culture INSIGNIFICANT GROWTH   Final    Report Status 12/12/2011 FINAL   Final      Labs: Basic Metabolic Panel:  Lab 12/11/11 1610 12/10/11 1330  NA 143 140  K 3.8 3.8  CL 104 98  CO2 28 30  GLUCOSE 107* 154*  BUN 17 33*  CREATININE 0.89 1.80*  CALCIUM 9.8 10.4  MG -- --  PHOS -- --   Liver Function Tests:  Lab 12/10/11 1330  AST 38*  ALT 19  ALKPHOS 76  BILITOT 0.2*  PROT 7.1  ALBUMIN 4.6   No results found for this basename: LIPASE:5,AMYLASE:5 in the last 168 hours No results found for this basename: AMMONIA:5 in the last 168 hours CBC:  Lab 12/11/11 0100 12/10/11 1330  WBC 4.7 5.1  NEUTROABS -- 4.0  HGB 12.4* 11.8*  HCT 36.1* 34.0*  MCV 91.4 91.6  PLT 262 257   Cardiac Enzymes:  Lab 12/11/11 0100 12/10/11 1330  CKTOTAL 565* 632*  CKMB 10.9* 12.5*  CKMBINDEX -- --  TROPONINI <0.30 <0.30   BNP: BNP (last 3 results) No results found for this basename: PROBNP:3 in the last 8760 hours CBG:  Lab 12/12/11 1141 12/12/11 0730 12/11/11 2128 12/11/11 1628 12/11/11 1146  GLUCAP 151* 111* 190* 155* 146*    Time coordinating discharge: 40  Signed: Maureen Duesing A  [facility] 12/12/2011, 3:14 PM

## 2011-12-14 NOTE — ED Provider Notes (Signed)
Medical screening examination/treatment/procedure(s) were conducted as a shared visit with non-physician practitioner(s) and myself.  I personally evaluated the patient during the encounter  Loren Racer, MD 12/14/11 2333

## 2011-12-25 ENCOUNTER — Ambulatory Visit (INDEPENDENT_AMBULATORY_CARE_PROVIDER_SITE_OTHER): Payer: Medicare Other | Admitting: Family

## 2011-12-25 ENCOUNTER — Encounter: Payer: Self-pay | Admitting: Family

## 2011-12-25 VITALS — BP 182/90 | HR 110 | Temp 99.0°F | Wt 160.0 lb

## 2011-12-25 DIAGNOSIS — R0683 Snoring: Secondary | ICD-10-CM

## 2011-12-25 DIAGNOSIS — R0989 Other specified symptoms and signs involving the circulatory and respiratory systems: Secondary | ICD-10-CM

## 2011-12-25 DIAGNOSIS — R5381 Other malaise: Secondary | ICD-10-CM

## 2011-12-25 DIAGNOSIS — G35 Multiple sclerosis: Secondary | ICD-10-CM

## 2011-12-25 DIAGNOSIS — R0609 Other forms of dyspnea: Secondary | ICD-10-CM

## 2011-12-25 DIAGNOSIS — M549 Dorsalgia, unspecified: Secondary | ICD-10-CM

## 2011-12-25 DIAGNOSIS — I10 Essential (primary) hypertension: Secondary | ICD-10-CM

## 2011-12-25 DIAGNOSIS — E119 Type 2 diabetes mellitus without complications: Secondary | ICD-10-CM

## 2011-12-25 DIAGNOSIS — R5383 Other fatigue: Secondary | ICD-10-CM

## 2011-12-25 LAB — LIPID PANEL
Cholesterol: 183 mg/dL (ref 0–200)
VLDL: 26.6 mg/dL (ref 0.0–40.0)

## 2011-12-25 LAB — CBC WITH DIFFERENTIAL/PLATELET
Basophils Absolute: 0 10*3/uL (ref 0.0–0.1)
HCT: 35.7 % — ABNORMAL LOW (ref 39.0–52.0)
Lymphocytes Relative: 4 % — ABNORMAL LOW (ref 12.0–46.0)
Lymphs Abs: 0.3 10*3/uL — ABNORMAL LOW (ref 0.7–4.0)
Monocytes Relative: 1.1 % — ABNORMAL LOW (ref 3.0–12.0)
Neutrophils Relative %: 94.9 % — ABNORMAL HIGH (ref 43.0–77.0)
Platelets: 278 10*3/uL (ref 150.0–400.0)
RDW: 15.1 % — ABNORMAL HIGH (ref 11.5–14.6)

## 2011-12-25 NOTE — Patient Instructions (Addendum)

## 2011-12-25 NOTE — Progress Notes (Signed)
Subjective:    Patient ID: Larry [name], male    DOB: 09/02/1950, 61 y.o.   MRN: [id number]  HPI 61 year old patient to the practice is here to be established. He has a history of type 2 diabetes, hypertension, chronic back pain, multiple sclerosis. Fasting blood sugars range between 90 -130, post prandial 110-140. She's had type 2 diabetes for 11 years. His blood pressures been elevated for several months. He currently takes Lisinopril 20 mg twice daily.  Patient has had difficulty discontinuing caffeine pills. He takes 2 caffeine pill daily to stay awake. Daughter has concerns that he may have sleep apnea. He snores loudly. Patient is due to have a laminectomy on T10 and needs surgical clearance. He was previously seen Dr. Cleda Daub but was unhappy with her service and the switch to [facility].  Last complete physical exam within March 2013. Last colonoscopy was about 8 years ago he is due to have a colonoscopy in 2015.   Review of Systems  Constitutional: Negative.   HENT: Negative.   Eyes: Negative.   Respiratory: Negative.   Cardiovascular: Negative.   Gastrointestinal: Negative.   Musculoskeletal: Negative.   Skin: Negative.   Neurological: Negative.   Hematological: Negative.   Psychiatric/Behavioral: Negative.      Past Medical History  Diagnosis Date  . MS (multiple sclerosis)   . Diabetes mellitus   . Hypertension   . Dementia   . Abnormality of gait   . Acute delirium 12/11/2011    History   Social History  . Marital Status: Legally Separated    Spouse Name: N/A    Number of Children: N/A  . Years of Education: N/A   Occupational History  . Disability    Social History Main Topics  . Smoking status: Never Smoker   . Smokeless tobacco: Not on file  . Alcohol Use: No  . Drug Use: No  . Sexually Active: Not Currently   Other Topics Concern  . Not on file   Social History Narrative  . No narrative on file    Past Surgical History  Procedure Date  .  Lumbar laminectomy   . Back surgery     No family history on file.  No Known Allergies  Current Outpatient Prescriptions on File Prior to Visit  Medication Sig Dispense Refill  . amitriptyline (ELAVIL) 25 MG tablet Take 25 mg by mouth at bedtime.      Marland Kitchen aspirin 81 MG tablet Take 81 mg by mouth daily.      . AVONEX PEN 30 MCG/0.5ML injection       . baclofen (LIORESAL) 20 MG tablet Take 20 mg by mouth 3 (three) times daily. And at bedtime as needed      . carbamazepine (TEGRETOL) 100 MG chewable tablet Chew by mouth 2 (two) times daily.      . cholecalciferol (VITAMIN D) 1000 UNITS tablet Take 1,000 Units by mouth daily.      Marland Kitchen gabapentin (NEURONTIN) 800 MG tablet Take 800 mg by mouth 2 (two) times daily. And  2 tablets at bedtime      . lisinopril (PRINIVIL,ZESTRIL) 20 MG tablet Take 20 mg by mouth daily.      . metFORMIN (GLUCOPHAGE) 1000 MG tablet Take 1,000 mg by mouth 2 (two) times daily with a meal.      . Multiple Vitamin (MULTIVITAMIN) tablet Take 1 tablet by mouth daily.      . pioglitazone (ACTOS) 30 MG tablet Take 30 mg by mouth daily.      Marland Kitchen  solifenacin (VESICARE) 10 MG tablet Take 5 mg by mouth daily.      Marland Kitchen tiZANidine (ZANAFLEX) 4 MG capsule Take 2-4 mg by mouth 2 (two) times daily as needed.      . traMADol (ULTRAM) 50 MG tablet Take 50 mg by mouth 4 (four) times daily.        BP 182/90  Pulse 110  Temp 99 F (37.2 C) (Oral)  Wt 160 lb (72.576 kg)  SpO2 98%chart  Objective:   Physical Exam  Constitutional: He is oriented to person, place, and time. He appears well-developed and well-nourished.  HENT:  Head: Normocephalic and atraumatic.  Right Ear: External ear normal.  Eyes: Conjunctivae are normal. Pupils are equal, round, and reactive to light.  Neck: Normal range of motion. Neck supple.  Cardiovascular: Normal rate, regular rhythm and normal heart sounds.   Pulmonary/Chest: Effort normal and breath sounds normal.  Abdominal: Soft. Bowel sounds are normal.    Musculoskeletal: Normal range of motion.  Neurological: He is alert and oriented to person, place, and time.  Skin: Skin is warm and dry.  Psychiatric: He has a normal mood and affect.          Assessment & Plan:  Assessment: Type 2 Diabetes, Back Pain, Multiple Sclerosis, Fatigue, Caffeine Addiction  Plan: Refer for sleep study. Labs sent. D/C Lisinopril and start Azor 20/5  Once daily. Encouraged healthy diet and exercise. Patient will decrease the consumption of his caffeine pills to once a day x10 days, one half tablet x10 days, then stop.We will bring him back in 2 weeks to recheck his blood pressure and consider surgical clearance. I am uncomfortable, safely,  clearing him for surgery today.

## 2011-12-26 LAB — COMPLETE METABOLIC PANEL WITH GFR
Albumin: 4.5 g/dL (ref 3.5–5.2)
CO2: 29 mEq/L (ref 19–32)
Calcium: 9.9 mg/dL (ref 8.4–10.5)
Chloride: 100 mEq/L (ref 96–112)
GFR, Est African American: >89 mL/min
GFR, Est Non African American: >89 mL/min
Glucose, Bld: 218 mg/dL — ABNORMAL HIGH (ref 70–99)
Potassium: 5.1 mEq/L (ref 3.5–5.3)
Sodium: 139 mEq/L (ref 135–145)
Total Protein: 6.7 g/dL (ref 6.0–8.3)

## 2011-12-28 ENCOUNTER — Other Ambulatory Visit: Payer: Self-pay | Admitting: Internal Medicine

## 2012-01-08 ENCOUNTER — Ambulatory Visit (INDEPENDENT_AMBULATORY_CARE_PROVIDER_SITE_OTHER): Payer: Medicare Other | Admitting: Family

## 2012-01-08 VITALS — BP 200/110 | HR 97 | Temp 98.8°F | Wt 160.0 lb

## 2012-01-08 DIAGNOSIS — I10 Essential (primary) hypertension: Secondary | ICD-10-CM

## 2012-01-08 DIAGNOSIS — E119 Type 2 diabetes mellitus without complications: Secondary | ICD-10-CM

## 2012-01-08 MED ORDER — AMLODIPINE-OLMESARTAN 5-40 MG PO TABS: 1.0000 | ORAL_TABLET | Freq: Every day | ORAL | Status: DC

## 2012-01-08 NOTE — Progress Notes (Signed)
Subjective:    Patient ID: Larry [name], male    DOB: Feb 28, 1951, 61 y.o.   MRN: [id number]  HPI 61 year old AAM is in today for recheck of Hypertension. He is currently taking Azor 20/5 once daily and tolerating it well. Denies any concerns.    Review of Systems  Constitutional: Negative.   HENT: Negative.   Respiratory: Negative.   Cardiovascular: Negative.   Gastrointestinal: Negative.   Musculoskeletal: Negative.   Skin: Negative.   Neurological: Negative.   Hematological: Negative.   Psychiatric/Behavioral: Negative.    Past Medical History  Diagnosis Date  . MS (multiple sclerosis)   . Diabetes mellitus   . Hypertension   . Dementia   . Abnormality of gait   . Acute delirium 12/11/2011    History   Social History  . Marital Status: Legally Separated    Spouse Name: N/A    Number of Children: N/A  . Years of Education: N/A   Occupational History  . Disability    Social History Main Topics  . Smoking status: Never Smoker   . Smokeless tobacco: Not on file  . Alcohol Use: No  . Drug Use: No  . Sexually Active: Not Currently   Other Topics Concern  . Not on file   Social History Narrative  . No narrative on file    Past Surgical History  Procedure Date  . Lumbar laminectomy   . Back surgery     No family history on file.  No Known Allergies  Current Outpatient Prescriptions on File Prior to Visit  Medication Sig Dispense Refill  . ACCU-CHEK AVIVA PLUS test strip       . amitriptyline (ELAVIL) 25 MG tablet Take 25 mg by mouth at bedtime.      Marland Kitchen aspirin 81 MG tablet Take 81 mg by mouth daily.      . AVONEX PEN 30 MCG/0.5ML injection       . baclofen (LIORESAL) 20 MG tablet Take 20 mg by mouth 3 (three) times daily. And at bedtime as needed      . Blood Glucose Calibration (ACCU-CHEK AVIVA) SOLN       . carbamazepine (TEGRETOL) 100 MG chewable tablet Chew by mouth 2 (two) times daily.      . cholecalciferol (VITAMIN D) 1000 UNITS tablet Take  1,000 Units by mouth daily.      Marland Kitchen gabapentin (NEURONTIN) 800 MG tablet Take 800 mg by mouth 2 (two) times daily. And  2 tablets at bedtime      . metFORMIN (GLUCOPHAGE) 1000 MG tablet Take 1,000 mg by mouth 2 (two) times daily with a meal.      . Multiple Vitamin (MULTIVITAMIN) tablet Take 1 tablet by mouth daily.      . pioglitazone (ACTOS) 30 MG tablet Take 30 mg by mouth daily.      . predniSONE (DELTASONE) 20 MG tablet       . solifenacin (VESICARE) 10 MG tablet Take 5 mg by mouth daily.      Marland Kitchen tiZANidine (ZANAFLEX) 4 MG capsule Take 2-4 mg by mouth 2 (two) times daily as needed.      . traMADol (ULTRAM) 50 MG tablet Take 50 mg by mouth 4 (four) times daily.      Marland Kitchen amLODipine-olmesartan (AZOR) 5-40 MG per tablet Take 1 tablet by mouth daily.  28 tablet  0    BP 200/110  Pulse 97  Temp 98.8 F (37.1 C) (Oral)  Wt 160 lb (  72.576 kg)  SpO2 98%chart     Objective:   Physical Exam  Constitutional: He is oriented to person, place, and time. He appears well-developed and well-nourished.  Neck: Normal range of motion. Neck supple.  Cardiovascular: Normal rate, regular rhythm and normal heart sounds.   Pulmonary/Chest: Effort normal and breath sounds normal.  Abdominal: Soft. Bowel sounds are normal.  Neurological: He is alert and oriented to person, place, and time.  Skin: Skin is warm and dry.  Psychiatric: He has a normal mood and affect.    Clonidine .1mg  po x 1. 9:55am Recheck BP 160/90      Assessmsent & Plan:   Assessment: Uncontrolled Hypertension  Plan: increase Azor to 40/5 once a day. Decrease sodium intake. Recheck in 3 weeks.

## 2012-01-22 ENCOUNTER — Ambulatory Visit (HOSPITAL_BASED_OUTPATIENT_CLINIC_OR_DEPARTMENT_OTHER): Payer: Medicare Other | Attending: Family | Admitting: Radiology

## 2012-01-22 VITALS — Ht 67.0 in | Wt 170.0 lb

## 2012-01-22 DIAGNOSIS — G4733 Obstructive sleep apnea (adult) (pediatric): Secondary | ICD-10-CM

## 2012-01-22 DIAGNOSIS — R5383 Other fatigue: Secondary | ICD-10-CM

## 2012-01-22 DIAGNOSIS — R0683 Snoring: Secondary | ICD-10-CM

## 2012-01-28 DIAGNOSIS — G4730 Sleep apnea, unspecified: Secondary | ICD-10-CM

## 2012-01-28 DIAGNOSIS — G4710 Hypersomnia, unspecified: Secondary | ICD-10-CM

## 2012-01-29 NOTE — Procedures (Signed)
NAMERITO, LECOMTE NO.:  [id number]  MEDICAL RECORD NO.:  [id number]          PATIENT TYPE:  OUT  LOCATION:  SLEEP CENTER                 FACILITY:  [facility]  PHYSICIAN:  Barbaraann Share, MD,FCCPDATE OF BIRTH:  10/02/1948  DATE OF STUDY:  01/22/2012                           NOCTURNAL POLYSOMNOGRAM  REFERRING PHYSICIAN:  Padonda FNP Orvan Falconer  INDICATION FOR STUDY:  Hypersomnia with sleep apnea.  EPWORTH SLEEPINESS SCORE:  8.  SLEEP ARCHITECTURE:  The patient had a total sleep time of 178 minutes with no slow-wave sleep and only 26 minutes of REM.  Sleep onset latency was normal at 19 minutes, and REM onset was very prolonged at 331 minutes.  Sleep efficiency was poor at 39%.  RESPIRATORY DATA:  The patient was found to have 17 obstructive and central apneas, and 34 obstructive hypopneas, giving him an apnea- hypopnea index of 17 events per hour.  The events occurred in all body positions and there was moderate snoring noted throughout.  The patient did not meet split night protocol secondary to poor sleep onset and events were not occurring until after 1:00 a.m.  OXYGEN DATA:  There was O2 desaturation as low as 86% with the patient's obstructive events.  CARDIAC DATA:  Rare PAC noted.  MOVEMENT-PARASOMNIA:  The patient had no significant leg jerks or other abnormal behavior seen.  IMPRESSIONS-RECOMMENDATIONS: 1. Mild-to-moderate obstructive sleep apnea/hypopnea syndrome, with an     AHI of 17 events per hour and O2 desaturation as low as 86%.  The     patient did not meet split night criteria secondary to his events     not occurring until after 1 a.m.  Treatment for this degree of     sleep apnea can include a trial of weight loss alone if applicable,     upper airway surgery, dental appliance, and also CPAP.  Clinical     correlation is suggested. 2. Rare premature atrial contraction noted, but no clinically     significant arrhythmias were  seen.     Barbaraann Share, MD,FCCP Diplomate, American Board of Sleep Medicine   [facility]  D:  01/28/2012 15:13:53  T:  01/29/2012 04:55:54  Job:  [id number]

## 2012-02-05 ENCOUNTER — Encounter: Payer: Self-pay | Admitting: Family

## 2012-02-05 ENCOUNTER — Ambulatory Visit (INDEPENDENT_AMBULATORY_CARE_PROVIDER_SITE_OTHER): Payer: Medicare Other | Admitting: Family

## 2012-02-05 VITALS — BP 180/102 | Temp 98.6°F | Wt 159.0 lb

## 2012-02-05 DIAGNOSIS — Z23 Encounter for immunization: Secondary | ICD-10-CM

## 2012-02-05 DIAGNOSIS — I10 Essential (primary) hypertension: Secondary | ICD-10-CM

## 2012-02-05 MED ORDER — CLONIDINE HCL 0.1 MG PO TABS: 0.1000 mg | ORAL_TABLET | Freq: Two times a day (BID) | ORAL | Status: DC

## 2012-02-05 NOTE — Progress Notes (Signed)
Subjective:    Patient ID: Larry [name], male    DOB: 1951/02/08, 61 y.o.   MRN: [id number]  HPI 61 year old AAM, nonsmoker, is in today for a recheck of uncontrolled Hypertension. He is currently taking Azor 40/5 and tolerating it well. Denies an side effects. Would like to try Clonidine to help his blood pressure. Continues to run high at home.    Review of Systems  Constitutional: Negative.   HENT: Negative.   Respiratory: Negative.   Cardiovascular: Negative.   Gastrointestinal: Negative.   Musculoskeletal: Negative.   Skin: Negative.   Neurological: Negative.   Hematological: Negative.   Psychiatric/Behavioral: Negative.    Past Medical History  Diagnosis Date  . MS (multiple sclerosis)   . Diabetes mellitus   . Hypertension   . Dementia   . Abnormality of gait   . Acute delirium 12/11/2011    History   Social History  . Marital Status: Legally Separated    Spouse Name: N/A    Number of Children: N/A  . Years of Education: N/A   Occupational History  . Disability    Social History Main Topics  . Smoking status: Never Smoker   . Smokeless tobacco: Not on file  . Alcohol Use: No  . Drug Use: No  . Sexually Active: Not Currently   Other Topics Concern  . Not on file   Social History Narrative  . No narrative on file    Past Surgical History  Procedure Date  . Lumbar laminectomy   . Back surgery     No family history on file.  No Known Allergies  Current Outpatient Prescriptions on File Prior to Visit  Medication Sig Dispense Refill  . ACCU-CHEK AVIVA PLUS test strip       . amitriptyline (ELAVIL) 25 MG tablet Take 25 mg by mouth at bedtime.      Marland Kitchen amLODipine-olmesartan (AZOR) 5-40 MG per tablet Take 1 tablet by mouth daily.  28 tablet  0  . aspirin 81 MG tablet Take 81 mg by mouth daily.      . AVONEX PEN 30 MCG/0.5ML injection       . baclofen (LIORESAL) 20 MG tablet Take 20 mg by mouth 3 (three) times daily. And at bedtime as needed      .  Blood Glucose Calibration (ACCU-CHEK AVIVA) SOLN       . carbamazepine (TEGRETOL) 100 MG chewable tablet Chew by mouth 2 (two) times daily.      . cholecalciferol (VITAMIN D) 1000 UNITS tablet Take 1,000 Units by mouth daily.      Marland Kitchen gabapentin (NEURONTIN) 800 MG tablet Take 800 mg by mouth 2 (two) times daily. And  2 tablets at bedtime      . metFORMIN (GLUCOPHAGE) 1000 MG tablet Take 1,000 mg by mouth 2 (two) times daily with a meal.      . Multiple Vitamin (MULTIVITAMIN) tablet Take 1 tablet by mouth daily.      . pioglitazone (ACTOS) 30 MG tablet Take 30 mg by mouth daily.      . predniSONE (DELTASONE) 20 MG tablet       . solifenacin (VESICARE) 10 MG tablet Take 5 mg by mouth daily.      Marland Kitchen tiZANidine (ZANAFLEX) 4 MG capsule Take 2-4 mg by mouth 2 (two) times daily as needed.      . traMADol (ULTRAM) 50 MG tablet Take 50 mg by mouth 4 (four) times daily.      Marland Kitchen  cloNIDine (CATAPRES) 0.1 MG tablet Take 1 tablet (0.1 mg total) by mouth 2 (two) times daily.  60 tablet  3    BP 180/102  Temp 98.6 F (37 C) (Oral)  Wt 159 lb (72.122 kg)chart    Objective:   Physical Exam  Constitutional: He is oriented to person, place, and time. He appears well-developed and well-nourished.  HENT:  Right Ear: External ear normal.  Left Ear: External ear normal.  Nose: Nose normal.  Mouth/Throat: Oropharynx is clear and moist.       Right ear cerumen impaction  Neck: Normal range of motion. Neck supple.  Cardiovascular: Normal rate, regular rhythm and normal heart sounds.   Pulmonary/Chest: Effort normal and breath sounds normal.  Abdominal: Soft. Bowel sounds are normal.  Musculoskeletal: Normal range of motion.  Neurological: He is alert and oriented to person, place, and time.  Skin: Skin is warm and dry.  Psychiatric: He has a normal mood and affect.     Recheck Blood pressure: right arm: 170/84  left arm: 170/82     Assessment & Plan:  Assessment: Hypertension-uncontrolled, Type 2  Diabetes  Plan: Add Clonidine .1mg  twice a day to current medication regimen. He responded well to Clonidine in the office last OV so he would like to try it. Low sodium diet. Recheck patient in 3 weeks and sooner as needed.

## 2012-02-26 ENCOUNTER — Other Ambulatory Visit: Payer: Self-pay

## 2012-02-26 MED ORDER — GABAPENTIN 800 MG PO TABS: 800.0000 mg | ORAL_TABLET | Freq: Two times a day (BID) | ORAL | Status: DC

## 2012-03-01 ENCOUNTER — Other Ambulatory Visit: Payer: Self-pay

## 2012-03-01 MED ORDER — BACLOFEN 20 MG PO TABS: 20.0000 mg | ORAL_TABLET | Freq: Three times a day (TID) | ORAL | Status: DC

## 2012-03-18 ENCOUNTER — Other Ambulatory Visit: Payer: Self-pay | Admitting: Family

## 2012-03-18 MED ORDER — SOLIFENACIN SUCCINATE 10 MG PO TABS: 5.0000 mg | ORAL_TABLET | Freq: Every day | ORAL | Status: DC

## 2012-03-18 MED ORDER — PIOGLITAZONE HCL 30 MG PO TABS: 30.0000 mg | ORAL_TABLET | Freq: Every day | ORAL | Status: DC

## 2012-03-18 MED ORDER — METFORMIN HCL 1000 MG PO TABS: 1000.0000 mg | ORAL_TABLET | Freq: Two times a day (BID) | ORAL | Status: DC

## 2012-03-18 MED ORDER — AMITRIPTYLINE HCL 25 MG PO TABS: 25.0000 mg | ORAL_TABLET | Freq: Every day | ORAL | Status: DC

## 2012-03-18 NOTE — Telephone Encounter (Signed)
Pt needs refills generic actos 30 mg,metformin 1000mg ,amitriptyline 25 mg and vesicare 10 mg call into [facility]

## 2012-04-05 ENCOUNTER — Encounter: Payer: Self-pay | Admitting: Family

## 2012-04-05 ENCOUNTER — Ambulatory Visit (INDEPENDENT_AMBULATORY_CARE_PROVIDER_SITE_OTHER): Payer: Medicare Other | Admitting: Family

## 2012-04-05 VITALS — BP 180/110 | HR 68 | Wt 150.0 lb

## 2012-04-05 DIAGNOSIS — E559 Vitamin D deficiency, unspecified: Secondary | ICD-10-CM

## 2012-04-05 DIAGNOSIS — I10 Essential (primary) hypertension: Secondary | ICD-10-CM

## 2012-04-05 DIAGNOSIS — E119 Type 2 diabetes mellitus without complications: Secondary | ICD-10-CM

## 2012-04-05 DIAGNOSIS — E785 Hyperlipidemia, unspecified: Secondary | ICD-10-CM

## 2012-04-05 MED ORDER — BACLOFEN 20 MG PO TABS: 20.0000 mg | ORAL_TABLET | Freq: Three times a day (TID) | ORAL | Status: DC

## 2012-04-05 MED ORDER — SOLIFENACIN SUCCINATE 10 MG PO TABS: 10.0000 mg | ORAL_TABLET | Freq: Every day | ORAL | Status: DC

## 2012-04-05 MED ORDER — AMLODIPINE-OLMESARTAN 5-40 MG PO TABS: 1.0000 | ORAL_TABLET | Freq: Every day | ORAL | Status: DC

## 2012-04-05 NOTE — Progress Notes (Signed)
Subjective:    Patient ID: Larry [name], male    DOB: 03-29-1951, 61 y.o.   MRN: [id number]  HPI 61 year old African American male, nonsmoker, disable patient is in for recheck of type 2 diabetes, hypertension, hyperlipidemia. He's currently doing well on his medications. However, he has not been taken a sore. Patient reports he misunderstood and thought he wishes to take clonidine twice a day and discontinue a sore. Therefore, his blood pressure reading is elevated today. Does not routinely exercising. Diet is high in sodium. He has tried to follow a diet is low with carbohydrates and sugars.   Review of Systems  Constitutional: Negative.   HENT: Negative.   Eyes: Negative.   Respiratory: Negative.   Cardiovascular: Negative.  Negative for chest pain, palpitations and leg swelling.  Gastrointestinal: Negative.   Genitourinary: Negative.   Musculoskeletal: Negative.   Skin: Negative.   Neurological: Negative.   Hematological: Negative.   Psychiatric/Behavioral: Negative.        Objective:   Physical Exam  Constitutional: He is oriented to person, place, and time. He appears well-developed and well-nourished.  HENT:  Right Ear: External ear normal.  Left Ear: External ear normal.  Nose: Nose normal.  Mouth/Throat: Oropharynx is clear and moist.  Eyes: Conjunctivae normal are normal. Pupils are equal, round, and reactive to light.  Neck: Normal range of motion. Neck supple.  Cardiovascular: Normal rate, regular rhythm and normal heart sounds.   Pulmonary/Chest: Effort normal and breath sounds normal.  Abdominal: Soft. Bowel sounds are normal.  Musculoskeletal: Normal range of motion.  Neurological: He is alert and oriented to person, place, and time.  Skin: Skin is warm and dry.       Feet skin intact. Monofilament intact.   Psychiatric: He has a normal mood and affect.          Assessment & Plan:  Assessment: Type 2 diabetes, hypertension-uncontrolled, hyperlipidemia    Plan: Lab sent to include A1c, lipids, liver, BMP, CBC, vitamin D level. Will notify patient pending results. We'll bring patient back for recheck in 3 months and sooner when necessary. Advise to take a sore and clonidine. Patient will call in 2 weeks with a blood pressure reading. Past Medical History  Diagnosis Date  . MS (multiple sclerosis)   . Diabetes mellitus   . Hypertension   . Dementia   . Abnormality of gait   . Acute delirium 12/11/2011    History   Social History  . Marital Status: Legally Separated    Spouse Name: N/A    Number of Children: N/A  . Years of Education: N/A   Occupational History  . Disability    Social History Main Topics  . Smoking status: Never Smoker   . Smokeless tobacco: Not on file  . Alcohol Use: No  . Drug Use: No  . Sexually Active: Not Currently   Other Topics Concern  . Not on file   Social History Narrative  . No narrative on file    Past Surgical History  Procedure Date  . Lumbar laminectomy   . Back surgery     No family history on file.  No Known Allergies  Current Outpatient Prescriptions on File Prior to Visit  Medication Sig Dispense Refill  . ACCU-CHEK AVIVA PLUS test strip       . amitriptyline (ELAVIL) 25 MG tablet Take 1 tablet (25 mg total) by mouth at bedtime.  30 tablet  0  . aspirin 81 MG tablet Take 81  mg by mouth daily.      . AVONEX PEN 30 MCG/0.5ML injection       . Blood Glucose Calibration (ACCU-CHEK AVIVA) SOLN       . carbamazepine (TEGRETOL) 100 MG chewable tablet Chew by mouth 2 (two) times daily.      . cholecalciferol (VITAMIN D) 1000 UNITS tablet Take 1,000 Units by mouth daily.      . cloNIDine (CATAPRES) 0.1 MG tablet Take 1 tablet (0.1 mg total) by mouth 2 (two) times daily.  60 tablet  3  . gabapentin (NEURONTIN) 800 MG tablet Take 1 tablet (800 mg total) by mouth 2 (two) times daily. And  2 tablets at bedtime  120 tablet  3  . metFORMIN (GLUCOPHAGE) 1000 MG tablet Take 1 tablet (1,000  mg total) by mouth 2 (two) times daily with a meal.  60 tablet  0  . Multiple Vitamin (MULTIVITAMIN) tablet Take 1 tablet by mouth daily.      . pioglitazone (ACTOS) 30 MG tablet Take 1 tablet (30 mg total) by mouth daily.  30 tablet  0  . predniSONE (DELTASONE) 20 MG tablet       . tiZANidine (ZANAFLEX) 4 MG capsule Take 2-4 mg by mouth 2 (two) times daily as needed.      . traMADol (ULTRAM) 50 MG tablet Take 50 mg by mouth 4 (four) times daily.      . [DISCONTINUED] amLODipine-olmesartan (AZOR) 5-40 MG per tablet Take 1 tablet by mouth daily.  28 tablet  0  . [DISCONTINUED] solifenacin (VESICARE) 10 MG tablet Take 5 mg by mouth daily.        BP 180/110  Pulse 68  Wt 150 lb (68.04 kg)  SpO2 98%chart

## 2012-04-05 NOTE — Patient Instructions (Addendum)
Sodium-Controlled Diet  Sodium is a mineral. It is found in many foods. Sodium may be found naturally or added during the making of a food. The most common form of sodium is salt, which is made up of sodium and chloride. Reducing your sodium intake involves changing your eating habits.  The following guidelines will help you reduce the sodium in your diet:   Stop using the salt shaker.   Use salt sparingly in cooking and baking.   Substitute with sodium-free seasonings and spices.   Do not use a salt substitute (potassium chloride) without your caregiver's permission.   Include a variety of fresh, unprocessed foods in your diet.   Limit the use of processed and convenience foods that are high in sodium.  USE THE FOLLOWING FOODS SPARINGLY:  Breads/Starches   Commercial bread stuffing, commercial pancake or waffle mixes, coating mixes. Waffles. Croutons. Prepared (boxed or frozen) potato, rice, or noodle mixes that contain salt or sodium. Salted French fries or hash browns. Salted popcorn, breads, crackers, chips, or snack foods.  Vegetables   Vegetables canned with salt or prepared in cream, butter, or cheese sauces. Sauerkraut. Tomato or vegetable juices canned with salt.   Fresh vegetables are allowed if rinsed thoroughly.  Fruit   Fruit is okay to eat.  Meat and Meat Substitutes   Salted or smoked meats, such as bacon or Canadian bacon, chipped or corned beef, hot dogs, salt pork, luncheon meats, pastrami, ham, or sausage. Canned or smoked fish, poultry, or meat. Processed cheese or cheese spreads, blue or Roquefort cheese. Battered or frozen fish products. Prepared spaghetti sauce. Baked beans. Reuben sandwiches. Salted nuts. Caviar.  Milk   Limit buttermilk to 1 cup per week.  Soups and Combination Foods    Bouillon cubes, canned or dried soups, broth, consomm. Convenience (frozen or packaged) dinners with more than 600 mg sodium. Pot pies, pizza, Asian food, fast food cheeseburgers, and specialty sandwiches.  Desserts and Sweets   Regular (salted) desserts, pie, commercial fruit snack pies, commercial snack cakes, canned puddings.   Eat desserts and sweets in moderation.  Fats and Oils   Gravy mixes or canned gravy. No more than 1 to 2 tbs of salad dressing. Chip dips.   Eat fats and oils in moderation.  Beverages   See those listed under the vegetables and milk groups.  Condiments   Ketchup, mustard, meat sauces, salsa, regular (salted) and lite soy sauce or mustard. Dill pickles, olives, meat tenderizer. Prepared horseradish or pickle relish. Dutch-processed cocoa. Baking powder or baking soda used medicinally. Worcestershire sauce. "Light" salt. Salt substitute, unless approved by your caregiver.  Document Released: 11/07/2001 Document Revised: 08/10/2011 Document Reviewed: 06/10/2009  [employer] Patient Information 2013 [employer].

## 2012-04-06 ENCOUNTER — Ambulatory Visit: Payer: Medicare Other | Admitting: Family

## 2012-04-13 ENCOUNTER — Other Ambulatory Visit: Payer: Self-pay | Admitting: Family

## 2012-04-26 ENCOUNTER — Other Ambulatory Visit: Payer: Self-pay

## 2012-04-26 MED ORDER — TRAMADOL HCL 50 MG PO TABS: 50.0000 mg | ORAL_TABLET | Freq: Four times a day (QID) | ORAL | Status: DC

## 2012-04-27 ENCOUNTER — Other Ambulatory Visit: Payer: Self-pay

## 2012-05-02 MED ORDER — INTERFERON BETA-1A 30 MCG/0.5ML IM KIT: 30.0000 ug | PACK | INTRAMUSCULAR | Status: DC

## 2012-05-02 NOTE — Addendum Note (Signed)
Addended by: Beverely Low on: 05/02/2012 11:10 AM   Modules accepted: Orders

## 2012-05-02 NOTE — Addendum Note (Signed)
Addended by: Beverely Low on: 05/02/2012 03:40 PM   Modules accepted: Orders

## 2012-05-12 ENCOUNTER — Other Ambulatory Visit: Payer: Self-pay

## 2012-05-12 MED ORDER — CARBAMAZEPINE 100 MG PO CHEW: 200.0000 mg | CHEWABLE_TABLET | Freq: Two times a day (BID) | ORAL | Status: DC

## 2012-06-01 ENCOUNTER — Other Ambulatory Visit: Payer: Self-pay | Admitting: Family

## 2012-06-02 ENCOUNTER — Telehealth: Payer: Self-pay | Admitting: Family

## 2012-06-02 MED ORDER — BACLOFEN 20 MG PO TABS: 20.0000 mg | ORAL_TABLET | Freq: Three times a day (TID) | ORAL | Status: DC

## 2012-06-02 NOTE — Telephone Encounter (Signed)
Done

## 2012-06-02 NOTE — Telephone Encounter (Signed)
Pt states there was a mix up with his baclofen (LIORESAL) 20 MG tablet. He states the pharm gave him 10mg  last time, in error. He would like a new rx for 20 sent in to [facility] on [address]. Please advise.

## 2012-06-07 ENCOUNTER — Other Ambulatory Visit: Payer: Self-pay | Admitting: Family

## 2012-06-08 ENCOUNTER — Other Ambulatory Visit: Payer: Self-pay | Admitting: Family

## 2012-06-28 ENCOUNTER — Telehealth: Payer: Self-pay | Admitting: Family

## 2012-06-28 NOTE — Telephone Encounter (Addendum)
Larry [name] w/ [facility] needs to substitute for diabetic neuropathy compound, which is not covered. Would like like to substitute and has a substitute but needs your approval.

## 2012-06-29 NOTE — Telephone Encounter (Signed)
The compound containing Ketamine, baclofen, gabepetin, amytriptaline, bupivacaine, clonodine, nifedipine is not covered by pt's insurance, however, Raynelle Fanning states that the compound containing Meloxicam, topiramate, tizanidine, lidocaine is covered and works in a similar manner.  Is this ok to switch?   The medication was ordered on a fax that was sent in, signed off on and faxed back. It has not been scanned into chart yet

## 2012-06-30 NOTE — Telephone Encounter (Signed)
Ok to change

## 2012-06-30 NOTE — Telephone Encounter (Signed)
Left detailed message with Larry [name] to advise of PC's note

## 2012-07-01 ENCOUNTER — Other Ambulatory Visit: Payer: Self-pay | Admitting: Family

## 2012-07-06 ENCOUNTER — Ambulatory Visit (INDEPENDENT_AMBULATORY_CARE_PROVIDER_SITE_OTHER): Payer: Medicare Other | Admitting: Family

## 2012-07-06 ENCOUNTER — Encounter: Payer: Self-pay | Admitting: Family

## 2012-07-06 VITALS — BP 172/90 | HR 80 | Wt 150.0 lb

## 2012-07-06 DIAGNOSIS — E559 Vitamin D deficiency, unspecified: Secondary | ICD-10-CM

## 2012-07-06 DIAGNOSIS — E785 Hyperlipidemia, unspecified: Secondary | ICD-10-CM

## 2012-07-06 DIAGNOSIS — E119 Type 2 diabetes mellitus without complications: Secondary | ICD-10-CM

## 2012-07-06 DIAGNOSIS — E7800 Pure hypercholesterolemia, unspecified: Secondary | ICD-10-CM

## 2012-07-06 DIAGNOSIS — G35 Multiple sclerosis: Secondary | ICD-10-CM

## 2012-07-06 DIAGNOSIS — I10 Essential (primary) hypertension: Secondary | ICD-10-CM

## 2012-07-06 LAB — BASIC METABOLIC PANEL
CO2: 30 mEq/L (ref 19–32)
Calcium: 9.8 mg/dL (ref 8.4–10.5)
Chloride: 104 mEq/L (ref 96–112)
Glucose, Bld: 92 mg/dL (ref 70–99)
Sodium: 141 mEq/L (ref 135–145)

## 2012-07-06 LAB — HEPATIC FUNCTION PANEL
ALT: 17 U/L (ref 0–53)
AST: 27 U/L (ref 0–37)
Albumin: 4.6 g/dL (ref 3.5–5.2)
Alkaline Phosphatase: 77 U/L (ref 39–117)
Bilirubin, Direct: 0 mg/dL (ref 0.0–0.3)
Total Protein: 7.2 g/dL (ref 6.0–8.3)

## 2012-07-06 LAB — LIPID PANEL
Cholesterol: 148 mg/dL (ref 0–200)
HDL: 48.6 mg/dL (ref >39.00)
Triglycerides: 91 mg/dL (ref 0.0–149.0)
VLDL: 18.2 mg/dL (ref 0.0–40.0)

## 2012-07-06 LAB — HEMOGLOBIN A1C: Hgb A1c MFr Bld: 6.5 % (ref 4.6–6.5)

## 2012-07-06 LAB — CBC WITH DIFFERENTIAL/PLATELET
Eosinophils Absolute: 0.1 10*3/uL (ref 0.0–0.7)
MCHC: 33.4 g/dL (ref 30.0–36.0)
MCV: 93.6 fl (ref 78.0–100.0)
Monocytes Absolute: 0.2 10*3/uL (ref 0.1–1.0)
Neutrophils Relative %: 62.7 % (ref 43.0–77.0)
Platelets: 252 10*3/uL (ref 150.0–400.0)

## 2012-07-06 NOTE — Progress Notes (Signed)
Subjective:    Patient ID: Larry [name], male    DOB: 1950-11-19, 62 y.o.   MRN: [id number]  HPI 62 year old African American male, nonsmoker, is in for recheck of type 2 diabetes, hypertension, multiple sclerosis, and hypercholesterolemia. He is currently tolerating his medications well. Denies any concerns today.   Review of Systems  Constitutional: Negative.   HENT: Negative.   Respiratory: Negative.   Cardiovascular: Negative.   Gastrointestinal: Negative.   Genitourinary: Negative.   Musculoskeletal:       Ambulates with arm braces  Skin: Negative.   Neurological: Negative.   Hematological: Negative.   Psychiatric/Behavioral: Negative.    Past Medical History  Diagnosis Date  . MS (multiple sclerosis)   . Diabetes mellitus   . Hypertension   . Dementia   . Abnormality of gait   . Acute delirium 12/11/2011    History   Social History  . Marital Status: Legally Separated    Spouse Name: N/A    Number of Children: N/A  . Years of Education: N/A   Occupational History  . Disability    Social History Main Topics  . Smoking status: Never Smoker   . Smokeless tobacco: Not on file  . Alcohol Use: No  . Drug Use: No  . Sexually Active: Not Currently   Other Topics Concern  . Not on file   Social History Narrative  . No narrative on file    Past Surgical History  Procedure Date  . Lumbar laminectomy   . Back surgery     No family history on file.  No Known Allergies  Current Outpatient Prescriptions on File Prior to Visit  Medication Sig Dispense Refill  . ACCU-CHEK AVIVA PLUS test strip       . amitriptyline (ELAVIL) 25 MG tablet TAKE ONE TABLET BY MOUTH AT BEDTIME  30 tablet  3  . amLODipine-olmesartan (AZOR) 5-40 MG per tablet Take 1 tablet by mouth daily.  30 tablet  3  . aspirin 81 MG tablet Take 81 mg by mouth daily.      . baclofen (LIORESAL) 20 MG tablet Take 1 tablet (20 mg total) by mouth 3 (three) times daily.  90 each  2  . Blood  Glucose Calibration (ACCU-CHEK AVIVA) SOLN       . carbamazepine (TEGRETOL) 100 MG chewable tablet Chew 2 tablets (200 mg total) by mouth 2 (two) times daily.  60 tablet  3  . cholecalciferol (VITAMIN D) 1000 UNITS tablet Take 1,000 Units by mouth daily.      . cloNIDine (CATAPRES) 0.1 MG tablet TAKE ONE TABLET BY MOUTH TWICE DAILY  60 tablet  2  . gabapentin (NEURONTIN) 800 MG tablet Take 1 tablet (800 mg total) by mouth 2 (two) times daily. And  2 tablets at bedtime  120 tablet  3  . interferon beta-1a (AVONEX PEN) 30 MCG/0.5ML injection Inject 0.5 mLs (30 mcg total) into the muscle every 7 (seven) days.  12 each  3  . metFORMIN (GLUCOPHAGE) 1000 MG tablet TAKE ONE TABLET BY MOUTH TWICE DAILY WITH MEALS  60 tablet  3  . Multiple Vitamin (MULTIVITAMIN) tablet Take 1 tablet by mouth daily.      . pioglitazone (ACTOS) 30 MG tablet TAKE ONE TABLET BY MOUTH EVERY DAY  30 tablet  3  . predniSONE (DELTASONE) 20 MG tablet       . solifenacin (VESICARE) 10 MG tablet Take 1 tablet (10 mg total) by mouth daily.  30 tablet  3  . tiZANidine (ZANAFLEX) 4 MG capsule Take 2-4 mg by mouth 2 (two) times daily as needed.      . traMADol (ULTRAM) 50 MG tablet Take 1 tablet (50 mg total) by mouth every 6 (six) hours as needed for pain.  120 tablet  0  . [DISCONTINUED] solifenacin (VESICARE) 10 MG tablet Take 5 mg by mouth daily.        BP 172/90  Pulse 80  Wt 150 lb (68.04 kg)  SpO2 95%chart    Objective:   Physical Exam  Constitutional: He is oriented to person, place, and time. He appears well-developed and well-nourished.  HENT:  Right Ear: External ear normal.  Left Ear: External ear normal.  Nose: Nose normal.  Mouth/Throat: Oropharynx is clear and moist.  Eyes: Conjunctivae normal are normal.  Neck: Normal range of motion. Neck supple.  Cardiovascular: Normal rate, regular rhythm and normal heart sounds.   Pulmonary/Chest: Effort normal and breath sounds normal.  Abdominal: Soft. Bowel sounds are  normal.  Musculoskeletal: Normal range of motion.  Neurological: He is alert and oriented to person, place, and time.  Skin: Skin is warm and dry.  Psychiatric: He has a normal mood and affect.          Assessment & Plan:  Assessment:  1. Type 2 diabetes 2. Hypertension 3. Multiple sclerosis 4. Hypercholesterolemia  Plan: Lab sent to include A1c, lipids, CBC, LFTs, BMP will notify patient pending results. Encouraged healthy diet and exercise. Continue current medications. Bring patient back for recheck in 3 months and sooner as needed.

## 2012-07-21 ENCOUNTER — Other Ambulatory Visit: Payer: Self-pay | Admitting: Family

## 2012-08-03 ENCOUNTER — Other Ambulatory Visit: Payer: Self-pay | Admitting: Family

## 2012-08-22 ENCOUNTER — Other Ambulatory Visit: Payer: Self-pay

## 2012-08-22 MED ORDER — BACLOFEN 20 MG PO TABS: 20.0000 mg | ORAL_TABLET | Freq: Three times a day (TID) | ORAL | Status: DC

## 2012-08-25 ENCOUNTER — Other Ambulatory Visit: Payer: Self-pay | Admitting: Family

## 2012-08-29 ENCOUNTER — Other Ambulatory Visit: Payer: Self-pay | Admitting: Family

## 2012-09-01 ENCOUNTER — Other Ambulatory Visit: Payer: Self-pay | Admitting: Family

## 2012-09-05 ENCOUNTER — Other Ambulatory Visit: Payer: Self-pay | Admitting: Family

## 2012-09-06 ENCOUNTER — Other Ambulatory Visit: Payer: Self-pay | Admitting: Family

## 2012-09-11 ENCOUNTER — Other Ambulatory Visit: Payer: Self-pay | Admitting: Family

## 2012-09-14 ENCOUNTER — Other Ambulatory Visit: Payer: Self-pay | Admitting: Family

## 2012-10-18 ENCOUNTER — Other Ambulatory Visit: Payer: Self-pay | Admitting: Family

## 2012-11-07 ENCOUNTER — Encounter: Payer: Self-pay | Admitting: Family

## 2012-11-07 ENCOUNTER — Other Ambulatory Visit: Payer: Self-pay | Admitting: Family

## 2012-11-07 ENCOUNTER — Ambulatory Visit (INDEPENDENT_AMBULATORY_CARE_PROVIDER_SITE_OTHER): Payer: Medicare Other | Admitting: Family

## 2012-11-07 VITALS — BP 160/90 | HR 114 | Wt 166.0 lb

## 2012-11-07 DIAGNOSIS — E119 Type 2 diabetes mellitus without complications: Secondary | ICD-10-CM

## 2012-11-07 DIAGNOSIS — G35 Multiple sclerosis: Secondary | ICD-10-CM

## 2012-11-07 DIAGNOSIS — I10 Essential (primary) hypertension: Secondary | ICD-10-CM

## 2012-11-07 DIAGNOSIS — R0902 Hypoxemia: Secondary | ICD-10-CM

## 2012-11-07 LAB — BASIC METABOLIC PANEL
BUN: 16 mg/dL (ref 6–23)
GFR: 83.75 mL/min (ref >60.00)
Glucose, Bld: 127 mg/dL — ABNORMAL HIGH (ref 70–99)
Potassium: 4.8 mEq/L (ref 3.5–5.1)

## 2012-11-07 LAB — HEPATIC FUNCTION PANEL
ALT: 13 U/L (ref 0–53)
AST: 26 U/L (ref 0–37)
Albumin: 4.1 g/dL (ref 3.5–5.2)

## 2012-11-07 LAB — HEMOGLOBIN A1C: Hgb A1c MFr Bld: 6.2 % (ref 4.6–6.5)

## 2012-11-07 NOTE — Patient Instructions (Addendum)

## 2012-11-07 NOTE — Progress Notes (Signed)
Subjective:    Patient ID: Larry [name], male    DOB: [birth year], 62 y.o.   MRN: [id number]  HPI 62 year old AAM, nonsmoker, is  today for a recheck of DM, HTN, MS. He is currently doing well. Has concerns of drowsiness during the day. In the past has been on Provigil that helped. Had a sleep study performed that showed hyoxia at night, with o2 sats 86%. Is not currently on any oxygen at night.    Review of Systems  Constitutional: Negative.   HENT: Negative.   Respiratory: Negative.   Gastrointestinal: Negative.   Endocrine: Negative.   Musculoskeletal: Negative.   Neurological: Negative.   Psychiatric/Behavioral: Negative.    Past Medical History  Diagnosis Date  . MS (multiple sclerosis)   . Diabetes mellitus   . Hypertension   . Dementia   . Abnormality of gait   . Acute delirium 12/11/2011    History   Social History  . Marital Status: Legally Separated    Spouse Name: N/A    Number of Children: N/A  . Years of Education: N/A   Occupational History  . Disability    Social History Main Topics  . Smoking status: Never Smoker   . Smokeless tobacco: Not on file  . Alcohol Use: No  . Drug Use: No  . Sexually Active: Not Currently   Other Topics Concern  . Not on file   Social History Narrative  . No narrative on file    Past Surgical History  Procedure Laterality Date  . Lumbar laminectomy    . Back surgery      No family history on file.  No Known Allergies  Current Outpatient Prescriptions on File Prior to Visit  Medication Sig Dispense Refill  . ACCU-CHEK AVIVA PLUS test strip       . amitriptyline (ELAVIL) 25 MG tablet TAKE ONE TABLET BY MOUTH AT BEDTIME  90 tablet  0  . amitriptyline (ELAVIL) 25 MG tablet TAKE ONE TABLET BY MOUTH AT BEDTIME  30 tablet  0  . aspirin 81 MG tablet Take 81 mg by mouth daily.      . AZOR 5-40 MG per tablet TAKE ONE TABLET BY MOUTH EVERY DAY  30 tablet  3  . baclofen (LIORESAL) 20 MG tablet Take 1 tablet (20 mg  total) by mouth 3 (three) times daily.  270 each  2  . Blood Glucose Calibration (ACCU-CHEK AVIVA) SOLN       . carbamazepine (TEGRETOL) 100 MG chewable tablet CHEW AND SWALLOW TWO TABLETS TWICE DAILY  120 tablet  3  . cholecalciferol (VITAMIN D) 1000 UNITS tablet Take 1,000 Units by mouth daily.      . cloNIDine (CATAPRES) 0.1 MG tablet TAKE ONE TABLET BY MOUTH TWICE DAILY  180 tablet  0  . cloNIDine (CATAPRES) 0.1 MG tablet TAKE ONE TABLET BY MOUTH TWICE DAILY  60 tablet  0  . gabapentin (NEURONTIN) 800 MG tablet TAKE ONE TABLET BY MOUTH TWICE DAILY AND TAKE TWO AT BEDTIME  120 tablet  0  . gabapentin (NEURONTIN) 800 MG tablet TAKE ONE TABLET BY MOUTH TWICE DAILY AND TAKE TWO AT BEDTIME  120 tablet  3  . interferon beta-1a (AVONEX PEN) 30 MCG/0.5ML injection Inject 0.5 mLs (30 mcg total) into the muscle every 7 (seven) days.  12 each  3  . metFORMIN (GLUCOPHAGE) 1000 MG tablet TAKE ONE TABLET BY MOUTH TWICE DAILY WITH MEALS  60 tablet  3  . Multiple  Vitamin (MULTIVITAMIN) tablet Take 1 tablet by mouth daily.      . pioglitazone (ACTOS) 30 MG tablet TAKE ONE TABLET BY MOUTH EVERY DAY  30 tablet  3  . predniSONE (DELTASONE) 20 MG tablet       . tiZANidine (ZANAFLEX) 4 MG capsule Take 2-4 mg by mouth 2 (two) times daily as needed.      . traMADol (ULTRAM) 50 MG tablet TAKE ONE TABLET BY MOUTH EVERY 6 HOURS AS NEEDED FOR PAIN  120 tablet  0  . VESICARE 10 MG tablet TAKE ONE TABLET BY MOUTH EVERY DAY  30 tablet  3  . [DISCONTINUED] solifenacin (VESICARE) 10 MG tablet Take 5 mg by mouth daily.       No current facility-administered medications on file prior to visit.    BP 160/90  Pulse 114  Wt 166 lb (75.297 kg)  BMI 25.99 kg/m2  SpO2 97%chart    Objective:   Physical Exam  Constitutional: He is oriented to person, place, and time. He appears well-developed and well-nourished.  Neck: Normal range of motion. Neck supple.  Cardiovascular: Normal rate, regular rhythm and normal heart sounds.    Pulmonary/Chest: Effort normal and breath sounds normal.  Abdominal: Soft. Bowel sounds are normal.  Musculoskeletal: Normal range of motion.  Neurological: He is alert and oriented to person, place, and time.  Skin: Skin is warm and dry.  Psychiatric: He has a normal mood and affect.          Assessment & Plan:  Assessment:  1.Hypertension 2. Hypoxia  3. MS 4. Type 2 DM  Plan: Labs sent. Refer to Respiratory for home O2 at night due to hypoxia per sleep study. Continue current meds. Call

## 2012-11-18 ENCOUNTER — Telehealth: Payer: Self-pay | Admitting: Family

## 2012-11-18 MED ORDER — ACCU-CHEK MULTICLIX LANCETS MISC: Status: DC

## 2012-11-18 NOTE — Telephone Encounter (Signed)
noted 

## 2012-11-18 NOTE — Telephone Encounter (Signed)
Rx sent to pharmacy   

## 2012-11-18 NOTE — Telephone Encounter (Signed)
Pt has Accu Check Aveva Multi Click Meter. Pt states someone called him.

## 2012-11-18 NOTE — Telephone Encounter (Signed)
[facility] called about pt's RX request for Multi-click Lancets.  [facility] states they need: directions, quantity, and refill.   Fax [phone]

## 2012-12-09 ENCOUNTER — Other Ambulatory Visit: Payer: Self-pay

## 2012-12-09 MED ORDER — SOLIFENACIN SUCCINATE 10 MG PO TABS: ORAL_TABLET | ORAL | Status: DC

## 2012-12-09 MED ORDER — PIOGLITAZONE HCL 30 MG PO TABS: ORAL_TABLET | ORAL | Status: DC

## 2012-12-09 MED ORDER — AMLODIPINE-OLMESARTAN 5-40 MG PO TABS: ORAL_TABLET | ORAL | Status: DC

## 2012-12-09 MED ORDER — METFORMIN HCL 1000 MG PO TABS: ORAL_TABLET | ORAL | Status: DC

## 2012-12-14 ENCOUNTER — Other Ambulatory Visit: Payer: Self-pay

## 2012-12-14 MED ORDER — PIOGLITAZONE HCL 30 MG PO TABS: ORAL_TABLET | ORAL | Status: DC

## 2012-12-14 MED ORDER — SOLIFENACIN SUCCINATE 10 MG PO TABS: ORAL_TABLET | ORAL | Status: DC

## 2012-12-14 MED ORDER — METFORMIN HCL 1000 MG PO TABS: ORAL_TABLET | ORAL | Status: DC

## 2012-12-14 MED ORDER — AMLODIPINE-OLMESARTAN 5-40 MG PO TABS: ORAL_TABLET | ORAL | Status: DC

## 2012-12-14 NOTE — Telephone Encounter (Signed)
Scripts sent to [facility]

## 2012-12-20 ENCOUNTER — Other Ambulatory Visit: Payer: Self-pay

## 2012-12-20 MED ORDER — TRAMADOL HCL 50 MG PO TABS: ORAL_TABLET | ORAL | Status: DC

## 2012-12-27 ENCOUNTER — Other Ambulatory Visit: Payer: Self-pay

## 2012-12-27 MED ORDER — CLONIDINE HCL 0.1 MG PO TABS: ORAL_TABLET | ORAL | Status: DC

## 2013-01-18 ENCOUNTER — Other Ambulatory Visit: Payer: Self-pay | Admitting: Family

## 2013-01-23 ENCOUNTER — Other Ambulatory Visit: Payer: Self-pay

## 2013-01-23 MED ORDER — AMITRIPTYLINE HCL 25 MG PO TABS: ORAL_TABLET | ORAL | Status: DC

## 2013-02-27 ENCOUNTER — Other Ambulatory Visit: Payer: Self-pay

## 2013-02-27 MED ORDER — TRAMADOL HCL 50 MG PO TABS: ORAL_TABLET | ORAL | Status: DC

## 2013-03-28 ENCOUNTER — Other Ambulatory Visit: Payer: Self-pay

## 2013-03-28 MED ORDER — GABAPENTIN 800 MG PO TABS: ORAL_TABLET | ORAL | Status: DC

## 2013-03-29 ENCOUNTER — Other Ambulatory Visit: Payer: Self-pay

## 2013-03-29 MED ORDER — CLONIDINE HCL 0.1 MG PO TABS: ORAL_TABLET | ORAL | Status: DC

## 2013-03-30 ENCOUNTER — Other Ambulatory Visit: Payer: Self-pay

## 2013-03-30 MED ORDER — SOLIFENACIN SUCCINATE 10 MG PO TABS: ORAL_TABLET | ORAL | Status: DC

## 2013-04-14 ENCOUNTER — Other Ambulatory Visit: Payer: Self-pay | Admitting: Family

## 2013-04-30 ENCOUNTER — Other Ambulatory Visit: Payer: Self-pay | Admitting: Family

## 2013-05-02 ENCOUNTER — Other Ambulatory Visit: Payer: Self-pay | Admitting: Family

## 2013-05-09 ENCOUNTER — Ambulatory Visit (INDEPENDENT_AMBULATORY_CARE_PROVIDER_SITE_OTHER): Payer: Medicare Other | Admitting: Family

## 2013-05-09 ENCOUNTER — Other Ambulatory Visit: Payer: Self-pay | Admitting: Family

## 2013-05-09 ENCOUNTER — Encounter: Payer: Self-pay | Admitting: Family

## 2013-05-09 ENCOUNTER — Ambulatory Visit (INDEPENDENT_AMBULATORY_CARE_PROVIDER_SITE_OTHER)
Admission: RE | Admit: 2013-05-09 | Discharge: 2013-05-09 | Disposition: A | Discharge status: Home / Self Care | Payer: Medicare Other | Source: Ambulatory Visit | Attending: Family | Admitting: Family

## 2013-05-09 VITALS — BP 162/100 | HR 91 | Wt 167.0 lb

## 2013-05-09 DIAGNOSIS — E7800 Pure hypercholesterolemia, unspecified: Secondary | ICD-10-CM

## 2013-05-09 DIAGNOSIS — D72819 Decreased white blood cell count, unspecified: Secondary | ICD-10-CM

## 2013-05-09 DIAGNOSIS — G35 Multiple sclerosis: Secondary | ICD-10-CM

## 2013-05-09 DIAGNOSIS — E119 Type 2 diabetes mellitus without complications: Secondary | ICD-10-CM

## 2013-05-09 DIAGNOSIS — M25559 Pain in unspecified hip: Secondary | ICD-10-CM

## 2013-05-09 DIAGNOSIS — Z23 Encounter for immunization: Secondary | ICD-10-CM

## 2013-05-09 DIAGNOSIS — I10 Essential (primary) hypertension: Secondary | ICD-10-CM

## 2013-05-09 DIAGNOSIS — M25551 Pain in right hip: Secondary | ICD-10-CM

## 2013-05-09 LAB — HEPATIC FUNCTION PANEL
Albumin: 4.3 g/dL (ref 3.5–5.2)
Bilirubin, Direct: 0 mg/dL (ref 0.0–0.3)
Total Protein: 7.2 g/dL (ref 6.0–8.3)

## 2013-05-09 LAB — CBC WITH DIFFERENTIAL/PLATELET
Basophils Absolute: 0 10*3/uL (ref 0.0–0.1)
Basophils Relative: 0.7 % (ref 0.0–3.0)
Eosinophils Absolute: 0.1 10*3/uL (ref 0.0–0.7)
Hemoglobin: 11 g/dL — ABNORMAL LOW (ref 13.0–17.0)
Lymphocytes Relative: 27.3 % (ref 12.0–46.0)
MCHC: 33.2 g/dL (ref 30.0–36.0)
MCV: 94.3 fl (ref 78.0–100.0)
Monocytes Absolute: 0.2 10*3/uL (ref 0.1–1.0)
Neutro Abs: 1.7 10*3/uL (ref 1.4–7.7)
Neutrophils Relative %: 62.4 % (ref 43.0–77.0)
Platelets: 348 10*3/uL (ref 150.0–400.0)
RBC: 3.5 Mil/uL — ABNORMAL LOW (ref 4.22–5.81)
RDW: 14.1 % (ref 11.5–14.6)

## 2013-05-09 LAB — BASIC METABOLIC PANEL
BUN: 18 mg/dL (ref 6–23)
Chloride: 103 mEq/L (ref 96–112)
Creatinine, Ser: 0.8 mg/dL (ref 0.4–1.5)

## 2013-05-09 LAB — LIPID PANEL
Cholesterol: 157 mg/dL (ref 0–200)
HDL: 48.6 mg/dL (ref >39.00)
Triglycerides: 56 mg/dL (ref 0.0–149.0)
VLDL: 11.2 mg/dL (ref 0.0–40.0)

## 2013-05-09 MED ORDER — AMLODIPINE-OLMESARTAN 10-40 MG PO TABS: 1.0000 | ORAL_TABLET | Freq: Every day | ORAL | Status: DC

## 2013-05-09 NOTE — Patient Instructions (Signed)

## 2013-05-09 NOTE — Progress Notes (Signed)
Subjective:    Patient ID: Larry [name], male    DOB: [birth year], 62 y.o.   MRN: [id number]  HPI  62 year old AAM, nonsmoker is in today for a recheck of Hypertension, DM, Multiple Sclerosis, he is currently doing well. Denies any concerns progress his medications. Has concerns about her hip pain that occurs a few times a week he rates the pain a creatinine 10, does not take anything for relief when it occurs. Reports he's lost his balance twice.   Review of Systems  Constitutional: Negative.   HENT: Negative.   Respiratory: Negative.   Cardiovascular: Negative.   Gastrointestinal: Negative.   Endocrine: Negative.   Genitourinary: Negative.   Musculoskeletal: Negative.   Skin: Negative.   Allergic/Immunologic: Negative.   Neurological: Negative.   Hematological: Negative.   Psychiatric/Behavioral: Negative.    Past Medical History  Diagnosis Date  . MS (multiple sclerosis)   . Diabetes mellitus   . Hypertension   . Dementia   . Abnormality of gait   . Acute delirium 12/11/2011    History   Social History  . Marital Status: Legally Separated    Spouse Name: N/A    Number of Children: N/A  . Years of Education: N/A   Occupational History  . Disability    Social History Main Topics  . Smoking status: Never Smoker   . Smokeless tobacco: Not on file  . Alcohol Use: No  . Drug Use: No  . Sexual Activity: Not Currently   Other Topics Concern  . Not on file   Social History Narrative  . No narrative on file    Past Surgical History  Procedure Laterality Date  . Lumbar laminectomy    . Back surgery      No family history on file.  No Known Allergies  Current Outpatient Prescriptions on File Prior to Visit  Medication Sig Dispense Refill  . ACCU-CHEK AVIVA PLUS test strip       . amitriptyline (ELAVIL) 25 MG tablet TAKE ONE TABLET BY MOUTH AT BEDTIME  30 tablet  3  . aspirin 81 MG tablet Take 81 mg by mouth daily.      . AVONEX PEN 30 MCG/0.5ML  injection Inject intramuscularly  every week.  3 each  4  . baclofen (LIORESAL) 20 MG tablet TAKE ONE TABLET BY MOUTH THREE TIMES DAILY  270 tablet  0  . Blood Glucose Calibration (ACCU-CHEK AVIVA) SOLN       . carbamazepine (TEGRETOL) 100 MG chewable tablet CHEW AND SWALLOW 2 TABLETS BY MOUTH TWICE DAILY  120 tablet  0  . carbamazepine (TEGRETOL) 100 MG chewable tablet CHEW AND SWALLOW 2 TABLETS BY MOUTH TWICE DAILY  120 tablet  0  . cholecalciferol (VITAMIN D) 1000 UNITS tablet Take 1,000 Units by mouth daily.      . cloNIDine (CATAPRES) 0.1 MG tablet TAKE ONE TABLET BY MOUTH TWICE DAILY  180 tablet  0  . gabapentin (NEURONTIN) 800 MG tablet TAKE ONE TABLET BY MOUTH TWICE DAILY AND TAKE TWO AT BEDTIME  120 tablet  3  . Lancets (ACCU-CHEK MULTICLIX) lancets Use to check blood glucose once daily  100 each  12  . metFORMIN (GLUCOPHAGE) 1000 MG tablet TAKE ONE TABLET BY MOUTH TWICE DAILY WITH MEALS  60 tablet  3  . Multiple Vitamin (MULTIVITAMIN) tablet Take 1 tablet by mouth daily.      . pioglitazone (ACTOS) 30 MG tablet TAKE ONE TABLET BY MOUTH EVERY DAY  30 tablet  3  . predniSONE (DELTASONE) 20 MG tablet       . solifenacin (VESICARE) 10 MG tablet TAKE ONE TABLET BY MOUTH EVERY DAY  90 tablet  0  . tiZANidine (ZANAFLEX) 4 MG capsule Take 2-4 mg by mouth 2 (two) times daily as needed.      . traMADol (ULTRAM) 50 MG tablet TAKE ONE TABLET BY MOUTH EVERY 6 HOURS AS NEEDED FOR PAIN  120 tablet  0  . [DISCONTINUED] solifenacin (VESICARE) 10 MG tablet Take 5 mg by mouth daily.       No current facility-administered medications on file prior to visit.    BP 162/100  Pulse 91  Wt 167 lb (75.751 kg)chart    Objective:   Physical Exam  Constitutional: He is oriented to person, place, and time. He appears well-developed.  HENT:  Right Ear: External ear normal.  Left Ear: External ear normal.  Nose: Nose normal.  Mouth/Throat: Oropharynx is clear and moist.  Neck: Normal range of  motion. Neck supple.  Cardiovascular: Normal rate, regular rhythm and normal heart sounds.   Pulmonary/Chest: Effort normal and breath sounds normal.  Abdominal: Soft. Bowel sounds are normal.  Musculoskeletal: Normal range of motion.  Neurological: He is alert and oriented to person, place, and time.  Skin: Skin is warm and dry.  Psychiatric: He has a normal mood and affect.          Assessment & Plan:  Assessment: 1. Type 2 DM 2. Multiple Sclerosis  3. Bilateral hip pain 4. Hypertension  Plan: Continue current medications except increase Azor to 10/40 once daily. Warned of potential for swelling. Advised patient to let me know if he has issues. Her labs sent today to include BMP, LFTs, lipids, A1c 10 patient results. Bilateral x-ray of the hips we'll refer to orthopedics if necessary. Call the office with any questions or concerns. Check her schedule, and as needed. Influenza vaccine administered.

## 2013-05-09 NOTE — Addendum Note (Signed)
Addended by: Beverely Low on: 05/09/2013 01:38 PM   Modules accepted: Orders

## 2013-05-09 NOTE — Progress Notes (Signed)
Pre visit review using our clinic review tool, if applicable. No additional management support is needed unless otherwise documented below in the visit note. 

## 2013-05-10 ENCOUNTER — Telehealth: Payer: Self-pay | Admitting: Internal Medicine

## 2013-05-10 NOTE — Telephone Encounter (Signed)
C/D 05/10/13 for appt. 12/22

## 2013-05-10 NOTE — Telephone Encounter (Signed)
S/W PT AND GVE NP APPT 12/22 @ 11 W/DR. MOHAMED REFERRING DR. Orvan Falconer DX-LEUKOPENIA

## 2013-05-22 ENCOUNTER — Telehealth: Payer: Self-pay | Admitting: Internal Medicine

## 2013-05-22 ENCOUNTER — Ambulatory Visit: Payer: Medicare Other

## 2013-05-22 ENCOUNTER — Other Ambulatory Visit: Payer: Self-pay | Admitting: Internal Medicine

## 2013-05-22 ENCOUNTER — Other Ambulatory Visit (HOSPITAL_BASED_OUTPATIENT_CLINIC_OR_DEPARTMENT_OTHER): Payer: Medicare Other

## 2013-05-22 ENCOUNTER — Encounter: Payer: Self-pay | Admitting: Internal Medicine

## 2013-05-22 ENCOUNTER — Ambulatory Visit (HOSPITAL_BASED_OUTPATIENT_CLINIC_OR_DEPARTMENT_OTHER): Payer: Medicare Other | Admitting: Internal Medicine

## 2013-05-22 VITALS — BP 178/92 | HR 99 | Temp 99.3°F | Resp 21 | Ht 67.0 in | Wt 165.6 lb

## 2013-05-22 DIAGNOSIS — D72819 Decreased white blood cell count, unspecified: Secondary | ICD-10-CM

## 2013-05-22 HISTORY — DX: Decreased white blood cell count, unspecified: D72.819

## 2013-05-22 LAB — COMPREHENSIVE METABOLIC PANEL (CC13)
ALT: 18 U/L (ref 0–55)
AST: 29 U/L (ref 5–34)
Albumin: 4.2 g/dL (ref 3.5–5.0)
Alkaline Phosphatase: 85 U/L (ref 40–150)
Calcium: 9.7 mg/dL (ref 8.4–10.4)
Chloride: 108 mEq/L (ref 98–109)
Creatinine: 0.9 mg/dL (ref 0.7–1.3)
Potassium: 4.2 mEq/L (ref 3.5–5.1)
Sodium: 143 mEq/L (ref 136–145)
Total Bilirubin: <0.2 mg/dL (ref 0.20–1.20)
Total Protein: 7 g/dL (ref 6.4–8.3)

## 2013-05-22 LAB — CBC WITH DIFFERENTIAL/PLATELET
BASO%: 0.6 % (ref 0.0–2.0)
Basophils Absolute: 0 10*3/uL (ref 0.0–0.1)
Eosinophils Absolute: 0.1 10*3/uL (ref 0.0–0.5)
HCT: 33.8 % — ABNORMAL LOW (ref 38.4–49.9)
HGB: 11.1 g/dL — ABNORMAL LOW (ref 13.0–17.1)
LYMPH%: 16.4 % (ref 14.0–49.0)
MCHC: 32.8 g/dL (ref 32.0–36.0)
MCV: 93.6 fL (ref 79.3–98.0)
MONO#: 0.2 10*3/uL (ref 0.1–0.9)
NEUT%: 76.2 % — ABNORMAL HIGH (ref 39.0–75.0)
Platelets: 220 10*3/uL (ref 140–400)
RBC: 3.61 10*6/uL — ABNORMAL LOW (ref 4.20–5.82)
WBC: 3.5 10*3/uL — ABNORMAL LOW (ref 4.0–10.3)

## 2013-05-22 LAB — LACTATE DEHYDROGENASE (CC13): LDH: 223 U/L (ref 125–245)

## 2013-05-22 NOTE — Patient Instructions (Signed)
Discontinue Tegretol. Followup visit in 2 months with repeat CBC.

## 2013-05-22 NOTE — Progress Notes (Signed)
[facility] [phone]   [phone]  CONSULT NOTE  REFERRING PHYSICIAN: Oliver Barre, FNP  REASON FOR CONSULTATION:  62 years old [place] American male with Leukocytopenia.  HPI Larry [name] is a 62 y.o. male with a past medical history significant for multiple medical problems including multiple sclerosis, diabetes mellitus, dyslipidemia, dementia, delirium, hypertension and status post lumbar laminectomy. The patient was seen recently by his primary care physician for routine physical evaluation and CBC performed on 05/05/2013 showed white blood count of 2.7 with an absolute neutrophil count of 1700, hemoglobin 11.0, hematocrit 33.0% with platelets count of 348,000. He was referred to me today for evaluation and recommendation regarding his leukocytopenia. Previous CBC on 07/06/2012 showed low white blood count of 2.7, absolute neutrophil count 1700, hemoglobin 11.7, hematocrit 35.1% with platelets count of 252,000.  Previous CBC on 12/25/2011 showed normal white blood count of 7.3, hemoglobin 11.7, hematocrit 35.7% and platelets count 278,000.  The patient is feeling fine and denied having any significant complaints. He denied having any recurrent infection. He has no history of hepatitis or HIV. He has no history of rheumatologic disorders. He has been on treatment with Tegretol for more than 2 years but he doesn't know the reason for this treatment. He is also on Avonex injection weekly MS. The patient denied having any complaints except for generalized weakness. He denied having any significant fever or chills, no chest pain, shortness of breath, cough or hemoptysis. He denied having any significant weight loss or night sweats.  Family history significant for a father who died from liver cancer mother died from old age. The patient is separated and has one daughter, Larry [name] who accompanied him today. He has no history of smoking, alcohol or drug  abuse. He used to work as a Midwife but currently on disability. HPI  Past Medical History  Diagnosis Date  . MS (multiple sclerosis)   . Diabetes mellitus   . Hypertension   . Dementia   . Abnormality of gait   . Acute delirium 12/11/2011    Past Surgical History  Procedure Laterality Date  . Lumbar laminectomy    . Back surgery      History reviewed. No pertinent family history.  Social History History  Substance Use Topics  . Smoking status: Never Smoker   . Smokeless tobacco: Not on file  . Alcohol Use: No    No Known Allergies  Current Outpatient Prescriptions  Medication Sig Dispense Refill  . ACCU-CHEK AVIVA PLUS test strip       . amitriptyline (ELAVIL) 25 MG tablet TAKE ONE TABLET BY MOUTH AT BEDTIME  30 tablet  3  . amLODipine-olmesartan (AZOR) 10-40 MG per tablet Take 1 tablet by mouth daily.  30 tablet  3  . aspirin 81 MG tablet Take 81 mg by mouth daily.      . AVONEX PEN 30 MCG/0.5ML injection Inject intramuscularly  every week.  3 each  4  . baclofen (LIORESAL) 20 MG tablet TAKE ONE TABLET BY MOUTH THREE TIMES DAILY  270 tablet  0  . Blood Glucose Calibration (ACCU-CHEK AVIVA) SOLN       . carbamazepine (TEGRETOL) 100 MG chewable tablet CHEW AND SWALLOW 2 TABLETS BY MOUTH TWICE DAILY  120 tablet  0  . cholecalciferol (VITAMIN D) 1000 UNITS tablet Take 1,000 Units by mouth daily.      . cloNIDine (CATAPRES) 0.1 MG tablet TAKE ONE TABLET BY MOUTH TWICE DAILY  180 tablet  0  . gabapentin (NEURONTIN) 800 MG tablet TAKE ONE TABLET BY MOUTH TWICE DAILY AND TAKE TWO AT BEDTIME  120 tablet  3  . Lancets (ACCU-CHEK MULTICLIX) lancets Use to check blood glucose once daily  100 each  12  . metFORMIN (GLUCOPHAGE) 1000 MG tablet TAKE ONE TABLET BY MOUTH TWICE DAILY WITH MEALS  60 tablet  3  . Multiple Vitamin (MULTIVITAMIN) tablet Take 1 tablet by mouth daily.      . pioglitazone (ACTOS) 30 MG tablet TAKE ONE TABLET BY MOUTH EVERY DAY  30 tablet  3  .  solifenacin (VESICARE) 10 MG tablet TAKE ONE TABLET BY MOUTH EVERY DAY  90 tablet  0  . traMADol (ULTRAM) 50 MG tablet TAKE ONE TABLET BY MOUTH EVERY 6 HOURS AS NEEDED FOR PAIN  120 tablet  0  . [DISCONTINUED] solifenacin (VESICARE) 10 MG tablet Take 5 mg by mouth daily.       No current facility-administered medications for this visit.    Review of Systems  Constitutional: positive for fatigue Eyes: negative Ears, nose, mouth, throat, and face: negative Respiratory: negative Cardiovascular: negative Gastrointestinal: negative Genitourinary:negative Integument/breast: negative Hematologic/lymphatic: negative Musculoskeletal:positive for muscle weakness Neurological: negative Behavioral/Psych: negative Endocrine: negative Allergic/Immunologic: negative  Physical Exam  ION:GEXBM, healthy, no distress, well nourished and well developed SKIN: skin color, texture, turgor are normal, no rashes or significant lesions HEAD: Normocephalic, No masses, lesions, tenderness or abnormalities EYES: normal, PERRLA EARS: External ears normal OROPHARYNX:no exudate, no erythema and lips, buccal mucosa, and tongue normal  NECK: supple, no adenopathy, no JVD LYMPH:  no palpable lymphadenopathy, no hepatosplenomegaly LUNGS: clear to auscultation , and palpation HEART: regular rate & rhythm and no murmurs ABDOMEN:abdomen soft, non-tender, normal bowel sounds and no masses or organomegaly BACK: Back symmetric, no curvature., No CVA tenderness EXTREMITIES:no joint deformities, effusion, or inflammation, no edema, no skin discoloration  NEURO: alert & oriented x 3 with fluent speech, no focal motor/sensory deficits  PERFORMANCE STATUS: ECOG 1  LABORATORY DATA: Lab Results  Component Value Date   WBC 3.5* 05/22/2013   HGB 11.1* 05/22/2013   HCT 33.8* 05/22/2013   MCV 93.6 05/22/2013   PLT 220 05/22/2013      Chemistry      Component Value Date/Time   NA 143 05/22/2013 1115   NA 136  05/09/2013 1115   K 4.2 05/22/2013 1115   K 3.8 05/09/2013 1115   CL 103 05/09/2013 1115   CO2 25 05/22/2013 1115   CO2 24 05/09/2013 1115   BUN 13.4 05/22/2013 1115   BUN 18 05/09/2013 1115   CREATININE 0.9 05/22/2013 1115   CREATININE 0.8 05/09/2013 1115   CREATININE 0.84 12/25/2011 1132      Component Value Date/Time   CALCIUM 9.7 05/22/2013 1115   CALCIUM 8.9 05/09/2013 1115   ALKPHOS 85 05/22/2013 1115   ALKPHOS 67 05/09/2013 1115   AST 29 05/22/2013 1115   AST 30 05/09/2013 1115   ALT 18 05/22/2013 1115   ALT 19 05/09/2013 1115   BILITOT <0.20 05/22/2013 1115   BILITOT 0.4 05/09/2013 1115       RADIOGRAPHIC STUDIES: Dg Hip Bilateral W/pelvis  05/09/2013   CLINICAL DATA:  Hip pain  EXAM: BILATERAL HIP WITH PELVIS - 4+ VIEW  COMPARISON:  None.  FINDINGS: Negative for fracture or mass. Negative for AVN. Mild degenerative change in the hip joints with slight joint space narrowing and acetabular spurring.  IMPRESSION: Mild osteoarthritis both hip joints.  No acute bony abnormality.   Electronically Signed   By: Marlan Palau M.D.   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   PLAN: I ordered several studies today to evaluate his condition including repeat CBC, comprehensive metabolic panel, LDH, vitamin B 12 level, serum folate, hepatitis panel, HIV, ANA and rheumatoid factor. I recommended for the patient to discontinue treatment with Tegretol at this point. I would see him back for followup visit in 2 months for reevaluation with repeat CBC. If there is no improvement in his condition after discontinuing Tegretol, I may consider the patient for a bone marrow biopsy and aspirate. He was advised to call immediately if he has any concerning symptoms in the  interval. The patient voices understanding of current disease status and treatment options and is in agreement with the current care plan.  All questions were answered. The patient knows to call the clinic with any problems, questions or concerns. We can certainly see the patient much sooner if necessary.  Thank you so much for allowing me to participate in the care of Larry [name]. I will continue to follow up the patient with you and assist in his care.  I spent 40 minutes counseling the patient face to face. The total time spent in the appointment was 60 minutes.  Benuel Ly K. 05/22/2013, 12:43 PM

## 2013-05-22 NOTE — Progress Notes (Signed)
Checked in new patient with no financial issues. He has appt card. °

## 2013-05-22 NOTE — Telephone Encounter (Signed)
gv and printed appt sched and avs for pt for Feb 2015 °

## 2013-05-23 LAB — HEPATITIS PANEL, ACUTE
HCV Ab: NEGATIVE
Hep A IgM: NONREACTIVE
Hep B C IgM: NONREACTIVE
Hepatitis B Surface Ag: NEGATIVE

## 2013-05-23 LAB — FOLATE: Folate: >20 ng/mL

## 2013-05-23 LAB — ANA: Anti Nuclear Antibody(ANA): NEGATIVE

## 2013-06-01 ENCOUNTER — Other Ambulatory Visit: Payer: Self-pay | Admitting: Family

## 2013-06-06 ENCOUNTER — Ambulatory Visit (INDEPENDENT_AMBULATORY_CARE_PROVIDER_SITE_OTHER): Payer: Medicare Other | Admitting: Family

## 2013-06-06 ENCOUNTER — Encounter: Payer: Self-pay | Admitting: Family

## 2013-06-06 VITALS — BP 192/100 | HR 99 | Wt 166.0 lb

## 2013-06-06 DIAGNOSIS — E1165 Type 2 diabetes mellitus with hyperglycemia: Secondary | ICD-10-CM

## 2013-06-06 DIAGNOSIS — I10 Essential (primary) hypertension: Secondary | ICD-10-CM

## 2013-06-06 DIAGNOSIS — IMO0002 Reserved for concepts with insufficient information to code with codable children: Secondary | ICD-10-CM

## 2013-06-06 DIAGNOSIS — IMO0001 Reserved for inherently not codable concepts without codable children: Secondary | ICD-10-CM

## 2013-06-06 MED ORDER — OLMESARTAN-AMLODIPINE-HCTZ 40-10-12.5 MG PO TABS: 1.0000 | ORAL_TABLET | Freq: Every day | ORAL | Status: DC

## 2013-06-06 NOTE — Patient Instructions (Signed)
Sodium-Controlled Diet Sodium is a mineral. It is found in many foods. Sodium may be found naturally or added during the making of a food. The most common form of sodium is salt, which is made up of sodium and chloride. Reducing your sodium intake involves changing your eating habits. The following guidelines will help you reduce the sodium in your diet:  Stop using the salt shaker.  Use salt sparingly in cooking and baking.  Substitute with sodium-free seasonings and spices.  Do not use a salt substitute (potassium chloride) without your caregiver's permission.  Include a variety of fresh, unprocessed foods in your diet.  Limit the use of processed and convenience foods that are high in sodium. USE THE FOLLOWING FOODS SPARINGLY: Breads/Starches  Commercial bread stuffing, commercial pancake or waffle mixes, coating mixes. Waffles. Croutons. Prepared (boxed or frozen) potato, rice, or noodle mixes that contain salt or sodium. Salted French fries or hash browns. Salted popcorn, breads, crackers, chips, or snack foods. Vegetables  Vegetables canned with salt or prepared in cream, butter, or cheese sauces. Sauerkraut. Tomato or vegetable juices canned with salt.  Fresh vegetables are allowed if rinsed thoroughly. Fruit  Fruit is okay to eat. Meat and Meat Substitutes  Salted or smoked meats, such as bacon or Canadian bacon, chipped or corned beef, hot dogs, salt pork, luncheon meats, pastrami, ham, or sausage. Canned or smoked fish, poultry, or meat. Processed cheese or cheese spreads, blue or Roquefort cheese. Battered or frozen fish products. Prepared spaghetti sauce. Baked beans. Reuben sandwiches. Salted nuts. Caviar. Milk  Limit buttermilk to 1 cup per week. Soups and Combination Foods  Bouillon cubes, canned or dried soups, broth, consomm. Convenience (frozen or packaged) dinners with more than 600 mg sodium. Pot pies, pizza, Asian food, fast food cheeseburgers, and specialty  sandwiches. Desserts and Sweets  Regular (salted) desserts, pie, commercial fruit snack pies, commercial snack cakes, canned puddings.  Eat desserts and sweets in moderation. Fats and Oils  Gravy mixes or canned gravy. No more than 1 to 2 tbs of salad dressing. Chip dips.  Eat fats and oils in moderation. Beverages  See those listed under the vegetables and milk groups. Condiments  Ketchup, mustard, meat sauces, salsa, regular (salted) and lite soy sauce or mustard. Dill pickles, olives, meat tenderizer. Prepared horseradish or pickle relish. Dutch-processed cocoa. Baking powder or baking soda used medicinally. Worcestershire sauce. "Light" salt. Salt substitute, unless approved by your caregiver. Document Released: 11/07/2001 Document Revised: 08/10/2011 Document Reviewed: 06/10/2009 [employer] Patient Information 2014 [employer].  

## 2013-06-06 NOTE — Progress Notes (Signed)
Pre visit review using our clinic review tool, if applicable. No additional management support is needed unless otherwise documented below in the visit note. 

## 2013-06-06 NOTE — Progress Notes (Signed)
Subjective:    Patient ID: Larry [name], male    DOB: Nov 23, 1950, 63 y.o.   MRN: [id number]  HPI A 63 year old [place] American male, with a history of type 2 diabetes, hypertension, hyperlipidemia, multiple sclerosis here for recheck of hypertension. He is currently taking Azor 10/40, clonidine 0.1 mg twice a day and tolerating them both well. Typically his blood pressure readings in the 160s [facility]. Denies any lightheadedness, dizziness, chest pain some palpitations, shortness of breath or edema. Does admit to higher sodium content over the last several days   Review of Systems  Constitutional: Negative.   HENT: Negative.   Respiratory: Negative.   Cardiovascular: Negative.   Gastrointestinal: Negative.   Endocrine: Negative.   Genitourinary: Negative.   Musculoskeletal: Negative.   Skin: Negative.   Allergic/Immunologic: Negative.   Neurological: Negative.   Hematological: Negative.   Psychiatric/Behavioral: Negative.    Past Medical History  Diagnosis Date  . MS (multiple sclerosis)   . Diabetes mellitus   . Hypertension   . Dementia   . Abnormality of gait   . Acute delirium 12/11/2011    History   Social History  . Marital Status: Legally Separated    Spouse Name: N/A    Number of Children: N/A  . Years of Education: N/A   Occupational History  . Disability    Social History Main Topics  . Smoking status: Never Smoker   . Smokeless tobacco: Not on file  . Alcohol Use: No  . Drug Use: No  . Sexual Activity: Not Currently   Other Topics Concern  . Not on file   Social History Narrative  . No narrative on file    Past Surgical History  Procedure Laterality Date  . Lumbar laminectomy    . Back surgery      No family history on file.  No Known Allergies  Current Outpatient Prescriptions on File Prior to Visit  Medication Sig Dispense Refill  . ACCU-CHEK AVIVA PLUS test strip       . amitriptyline (ELAVIL) 25 MG tablet  Take 1 tablet by mouth at  bedtime  90 tablet  1  . amLODipine-olmesartan (AZOR) 10-40 MG per tablet Take 1 tablet by mouth daily.  30 tablet  3  . aspirin 81 MG tablet Take 81 mg by mouth daily.      . AVONEX PEN 30 MCG/0.5ML injection Inject intramuscularly  80mcg every week.  3 each  4  . baclofen (LIORESAL) 20 MG tablet TAKE ONE TABLET BY MOUTH THREE TIMES DAILY  270 tablet  0  . Blood Glucose Calibration (ACCU-CHEK AVIVA) SOLN       . carbamazepine (TEGRETOL) 100 MG chewable tablet CHEW AND SWALLOW 2 TABLETS BY MOUTH TWICE DAILY  120 tablet  0  . cholecalciferol (VITAMIN D) 1000 UNITS tablet Take 1,000 Units by mouth daily.      . cloNIDine (CATAPRES) 0.1 MG tablet TAKE ONE TABLET BY MOUTH TWICE DAILY  180 tablet  0  . gabapentin (NEURONTIN) 800 MG tablet TAKE ONE TABLET BY MOUTH TWICE DAILY AND TAKE TWO AT BEDTIME  120 tablet  3  . Lancets (ACCU-CHEK MULTICLIX) lancets Use to check blood glucose once daily  100 each  12  . metFORMIN (GLUCOPHAGE) 1000 MG tablet TAKE ONE TABLET BY MOUTH TWICE DAILY WITH MEALS  60 tablet  3  . Multiple Vitamin (MULTIVITAMIN) tablet Take 1 tablet by mouth daily.      Marland Kitchen  pioglitazone (ACTOS) 30 MG tablet TAKE ONE TABLET BY MOUTH EVERY DAY  30 tablet  3  . pioglitazone (ACTOS) 30 MG tablet Take 1 tablet by mouth  every day  90 tablet  1  . solifenacin (VESICARE) 10 MG tablet TAKE ONE TABLET BY MOUTH EVERY DAY  90 tablet  0  . traMADol (ULTRAM) 50 MG tablet TAKE ONE TABLET BY MOUTH EVERY 6 HOURS AS NEEDED FOR PAIN  120 tablet  0  . [DISCONTINUED] solifenacin (VESICARE) 10 MG tablet Take 5 mg by mouth daily.       No current facility-administered medications on file prior to visit.    BP 192/100  Pulse 99  Wt 166 lb (75.297 kg)chart     Objective:   Physical Exam  Constitutional: He is oriented to person, place, and time. He appears well-developed.  HENT:  Right Ear: External ear normal.  Left Ear: External ear normal.  Nose: Nose normal.    Mouth/Throat: Oropharynx is clear and moist.  Neck: Normal range of motion. Neck supple.  Cardiovascular: Normal rate, regular rhythm and normal heart sounds.   Pulmonary/Chest: Effort normal and breath sounds normal.  Abdominal: Soft. Bowel sounds are normal.  Neurological: He is alert and oriented to person, place, and time.  Skin: Skin is warm.  Psychiatric: He has a normal mood and affect.          Assessment & Plan:  Assessment: 1. Uncontrolled hypertension 2. Type 2 diabetes 3. Multiple sclerosis  Plan:D/C Azor and start Tribenzor 40/10/12.5mg  once daily. Continue to monitor blood pressure daily at home. Recheck in 3 or 4 weeks and sooner as needed. Low-sodium diet.

## 2013-06-07 ENCOUNTER — Telehealth: Payer: Self-pay

## 2013-06-07 NOTE — Telephone Encounter (Signed)
Relevant patient education mailed to patient.  

## 2013-06-13 ENCOUNTER — Other Ambulatory Visit: Payer: Self-pay | Admitting: Family

## 2013-06-21 ENCOUNTER — Telehealth: Payer: Self-pay | Admitting: Family

## 2013-06-21 MED ORDER — BACLOFEN 10 MG PO TABS: 20.0000 mg | ORAL_TABLET | Freq: Three times a day (TID) | ORAL | Status: DC

## 2013-06-21 NOTE — Telephone Encounter (Signed)
Pt requesting refill of baclofen (LIORESAL) 20 MG tablet.  States pharmacy contacted office for refill but has not heard back from provider.  Pharmacy is [facility] [address]

## 2013-06-21 NOTE — Telephone Encounter (Signed)
Rx was sent on 06/13/13 for baclofen 20mg  tid. Spoke with pharmacy, who suggests sending baclofen 10mg  for pt to take 2 tabs tid as it is cheaper for pt.  Rx sent and pt aware

## 2013-06-23 ENCOUNTER — Other Ambulatory Visit: Payer: Self-pay | Admitting: Family

## 2013-06-26 ENCOUNTER — Encounter: Payer: Self-pay | Admitting: Family

## 2013-06-29 ENCOUNTER — Other Ambulatory Visit: Payer: Self-pay | Admitting: Family

## 2013-06-30 ENCOUNTER — Telehealth: Payer: Self-pay | Admitting: Family

## 2013-07-03 ENCOUNTER — Encounter: Payer: Self-pay | Admitting: Family

## 2013-07-03 ENCOUNTER — Ambulatory Visit (INDEPENDENT_AMBULATORY_CARE_PROVIDER_SITE_OTHER): Payer: Medicare Other | Admitting: Family

## 2013-07-03 VITALS — BP 152/80 | HR 80 | Wt 168.0 lb

## 2013-07-03 DIAGNOSIS — E7800 Pure hypercholesterolemia, unspecified: Secondary | ICD-10-CM

## 2013-07-03 DIAGNOSIS — I10 Essential (primary) hypertension: Secondary | ICD-10-CM

## 2013-07-03 DIAGNOSIS — E119 Type 2 diabetes mellitus without complications: Secondary | ICD-10-CM

## 2013-07-03 NOTE — Progress Notes (Signed)
Subjective:    Patient ID: Larry [name], male    DOB: [birth year], 63 y.o.   MRN: [id number]  HPI 63 year old African American male, nonsmoker, with a history of type 2 diabetes, hypertension-uncontrolled, multiple sclerosis is in today for recheck of elevated blood pressure. At his last office visit, we discontinued Azor and started Tribenzor once daily. He is tolerating the medication well. His daughter reports that she has confiscated all of a sudden his home. Blood pressure readings at home have been between 431-540 systolically over 08-67.   Review of Systems  Constitutional: Negative.   HENT: Negative.   Respiratory: Negative.   Cardiovascular: Negative.   Gastrointestinal: Negative.   Endocrine: Negative.   Genitourinary: Negative.   Musculoskeletal: Negative.   Skin: Negative.   Neurological: Negative.   Hematological: Negative.   Psychiatric/Behavioral: Negative.    Past Medical History  Diagnosis Date  . MS (multiple sclerosis)   . Diabetes mellitus   . Hypertension   . Dementia   . Abnormality of gait   . Acute delirium 12/11/2011    History   Social History  . Marital Status: Legally Separated    Spouse Name: N/A    Number of Children: N/A  . Years of Education: N/A   Occupational History  . Disability    Social History Main Topics  . Smoking status: Never Smoker   . Smokeless tobacco: Not on file  . Alcohol Use: No  . Drug Use: No  . Sexual Activity: Not Currently   Other Topics Concern  . Not on file   Social History Narrative  . No narrative on file    Past Surgical History  Procedure Laterality Date  . Lumbar laminectomy    . Back surgery      No family history on file.  No Known Allergies  Current Outpatient Prescriptions on File Prior to Visit  Medication Sig Dispense Refill  . ACCU-CHEK AVIVA PLUS test strip       . amitriptyline (ELAVIL) 25 MG tablet Take 1 tablet by mouth at  bedtime  90 tablet  1  . aspirin 81 MG tablet  Take 81 mg by mouth daily.      . AVONEX PEN 30 MCG/0.5ML injection Inject intramuscularly  53mcg every week.  3 each  4  . baclofen (LIORESAL) 10 MG tablet Take 2 tablets (20 mg total) by mouth 3 (three) times daily.  540 each  0  . Blood Glucose Calibration (ACCU-CHEK AVIVA) SOLN       . carbamazepine (TEGRETOL) 100 MG chewable tablet CHEW AND SWALLOW 2 TABLETS BY MOUTH TWICE DAILY  120 tablet  0  . cholecalciferol (VITAMIN D) 1000 UNITS tablet Take 1,000 Units by mouth daily.      . cloNIDine (CATAPRES) 0.1 MG tablet TAKE ONE TABLET BY MOUTH TWICE DAILY  180 tablet  0  . gabapentin (NEURONTIN) 800 MG tablet TAKE ONE TABLET BY MOUTH TWICE DAILY AND TAKE TWO AT BEDTIME  120 tablet  3  . Lancets (ACCU-CHEK MULTICLIX) lancets Use to check blood glucose once daily  100 each  12  . metFORMIN (GLUCOPHAGE) 1000 MG tablet TAKE ONE TABLET BY MOUTH TWICE DAILY WITH MEALS  60 tablet  3  . Multiple Vitamin (MULTIVITAMIN) tablet Take 1 tablet by mouth daily.      . Olmesartan-Amlodipine-HCTZ (TRIBENZOR) 40-10-12.5 MG TABS Take 1 tablet by mouth daily.  30 tablet  3  . pioglitazone (ACTOS) 30 MG tablet TAKE ONE TABLET BY MOUTH  EVERY DAY  30 tablet  3  . pioglitazone (ACTOS) 30 MG tablet Take 1 tablet by mouth  every day  90 tablet  1  . traMADol (ULTRAM) 50 MG tablet TAKE ONE TABLET BY MOUTH EVERY 6 HOURS AS NEEDED FOR PAIN  120 tablet  0  . VESICARE 10 MG tablet Take 1 tablet by mouth  daily  90 tablet  0  . [DISCONTINUED] solifenacin (VESICARE) 10 MG tablet Take 5 mg by mouth daily.       No current facility-administered medications on file prior to visit.    BP 152/80  Pulse 80  Wt 168 lb (76.204 kg)chart    Objective:   Physical Exam  Constitutional: He is oriented to person, place, and time. He appears well-developed and well-nourished.  HENT:  Right Ear: External ear normal.  Left Ear: External ear normal.  Nose: Nose normal.  Mouth/Throat: Oropharynx is clear and moist.  Neck: Normal  range of motion. Neck supple.  Cardiovascular: Normal rate, regular rhythm and normal heart sounds.   Pulmonary/Chest: Effort normal and breath sounds normal.  Blood pressure: 140/70   Abdominal: Soft. Bowel sounds are normal.  Musculoskeletal: Normal range of motion.  Neurological: He is alert and oriented to person, place, and time. He has normal reflexes.  Skin: Skin is warm and dry.  Psychiatric: He has a normal mood and affect.          Assessment & Plan:  Assessment: 1. Hypertension-improving 2. Type 2 diabetes 3. Hypertension 4. Multiple sclerosis  Plan: Continue current medications. Low sodium diet. We'll follow the patient in 3 months and sooner as needed.

## 2013-07-03 NOTE — Patient Instructions (Signed)
Sodium-Controlled Diet Sodium is a mineral. It is found in many foods. Sodium may be found naturally or added during the making of a food. The most common form of sodium is salt, which is made up of sodium and chloride. Reducing your sodium intake involves changing your eating habits. The following guidelines will help you reduce the sodium in your diet:  Stop using the salt shaker.  Use salt sparingly in cooking and baking.  Substitute with sodium-free seasonings and spices.  Do not use a salt substitute (potassium chloride) without your caregiver's permission.  Include a variety of fresh, unprocessed foods in your diet.  Limit the use of processed and convenience foods that are high in sodium. USE THE FOLLOWING FOODS SPARINGLY: Breads/Starches  Commercial bread stuffing, commercial pancake or waffle mixes, coating mixes. Waffles. Croutons. Prepared (boxed or frozen) potato, rice, or noodle mixes that contain salt or sodium. Salted French fries or hash browns. Salted popcorn, breads, crackers, chips, or snack foods. Vegetables  Vegetables canned with salt or prepared in cream, butter, or cheese sauces. Sauerkraut. Tomato or vegetable juices canned with salt.  Fresh vegetables are allowed if rinsed thoroughly. Fruit  Fruit is okay to eat. Meat and Meat Substitutes  Salted or smoked meats, such as bacon or Canadian bacon, chipped or corned beef, hot dogs, salt pork, luncheon meats, pastrami, ham, or sausage. Canned or smoked fish, poultry, or meat. Processed cheese or cheese spreads, blue or Roquefort cheese. Battered or frozen fish products. Prepared spaghetti sauce. Baked beans. Reuben sandwiches. Salted nuts. Caviar. Milk  Limit buttermilk to 1 cup per week. Soups and Combination Foods  Bouillon cubes, canned or dried soups, broth, consomm. Convenience (frozen or packaged) dinners with more than 600 mg sodium. Pot pies, pizza, Asian food, fast food cheeseburgers, and specialty  sandwiches. Desserts and Sweets  Regular (salted) desserts, pie, commercial fruit snack pies, commercial snack cakes, canned puddings.  Eat desserts and sweets in moderation. Fats and Oils  Gravy mixes or canned gravy. No more than 1 to 2 tbs of salad dressing. Chip dips.  Eat fats and oils in moderation. Beverages  See those listed under the vegetables and milk groups. Condiments  Ketchup, mustard, meat sauces, salsa, regular (salted) and lite soy sauce or mustard. Dill pickles, olives, meat tenderizer. Prepared horseradish or pickle relish. Dutch-processed cocoa. Baking powder or baking soda used medicinally. Worcestershire sauce. "Light" salt. Salt substitute, unless approved by your caregiver. Document Released: 11/07/2001 Document Revised: 08/10/2011 Document Reviewed: 06/10/2009 [employer] Patient Information 2014 [employer].  

## 2013-07-04 ENCOUNTER — Telehealth: Payer: Self-pay | Admitting: Family

## 2013-07-04 NOTE — Telephone Encounter (Signed)
Relevant patient education assigned to patient using Emmi. ° °

## 2013-07-05 ENCOUNTER — Telehealth: Payer: Self-pay | Admitting: Family

## 2013-07-05 MED ORDER — GABAPENTIN 800 MG PO TABS: ORAL_TABLET | ORAL | Status: DC

## 2013-07-05 NOTE — Telephone Encounter (Signed)
Sent!

## 2013-07-05 NOTE — Telephone Encounter (Signed)
[facility] requesting new script for gabapentin (NEURONTIN) 800 MG tablet.

## 2013-07-05 NOTE — Telephone Encounter (Signed)
Relevant patient education mailed to patient.  

## 2013-07-06 ENCOUNTER — Telehealth: Payer: Self-pay

## 2013-07-06 NOTE — Telephone Encounter (Signed)
Relevant patient education assigned to patient using Emmi. ° °

## 2013-07-19 ENCOUNTER — Telehealth: Payer: Self-pay | Admitting: Internal Medicine

## 2013-07-19 NOTE — Telephone Encounter (Signed)
pt called to changed time for lb/MM 2/23 to 3:15pm.

## 2013-07-24 ENCOUNTER — Telehealth: Payer: Self-pay | Admitting: Internal Medicine

## 2013-07-24 ENCOUNTER — Encounter: Payer: Self-pay | Admitting: Internal Medicine

## 2013-07-24 ENCOUNTER — Ambulatory Visit (HOSPITAL_BASED_OUTPATIENT_CLINIC_OR_DEPARTMENT_OTHER): Payer: Medicare Other | Admitting: Internal Medicine

## 2013-07-24 ENCOUNTER — Other Ambulatory Visit (HOSPITAL_BASED_OUTPATIENT_CLINIC_OR_DEPARTMENT_OTHER): Payer: Medicare Other

## 2013-07-24 VITALS — BP 161/80 | HR 102 | Temp 98.2°F | Resp 18 | Ht 67.0 in | Wt 168.5 lb

## 2013-07-24 DIAGNOSIS — D72819 Decreased white blood cell count, unspecified: Secondary | ICD-10-CM

## 2013-07-24 LAB — CBC WITH DIFFERENTIAL/PLATELET
BASO%: 0.6 % (ref 0.0–2.0)
Basophils Absolute: 0 10*3/uL (ref 0.0–0.1)
EOS ABS: 0.1 10*3/uL (ref 0.0–0.5)
EOS%: 2.1 % (ref 0.0–7.0)
HCT: 33.6 % — ABNORMAL LOW (ref 38.4–49.9)
HEMOGLOBIN: 10.8 g/dL — AB (ref 13.0–17.1)
LYMPH#: 0.7 10*3/uL — AB (ref 0.9–3.3)
LYMPH%: 17.6 % (ref 14.0–49.0)
MCH: 30.7 pg (ref 27.2–33.4)
MCHC: 32.3 g/dL (ref 32.0–36.0)
MCV: 95 fL (ref 79.3–98.0)
MONO#: 0.3 10*3/uL (ref 0.1–0.9)
MONO%: 7.5 % (ref 0.0–14.0)
NEUT%: 72.2 % (ref 39.0–75.0)
NEUTROS ABS: 2.8 10*3/uL (ref 1.5–6.5)
Platelets: 266 10*3/uL (ref 140–400)
RBC: 3.53 10*6/uL — ABNORMAL LOW (ref 4.20–5.82)
RDW: 14 % (ref 11.0–14.6)
WBC: 3.8 10*3/uL — ABNORMAL LOW (ref 4.0–10.3)

## 2013-07-24 NOTE — Progress Notes (Signed)
[place] Telephone:(336) [phone]   Fax:(336) [phone]  OFFICE PROGRESS NOTE  CAMPBELL, Hillard Danker, FNP [address] 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 2) anemia of chronic disease.  PRIOR THERAPY: None  CURRENT THERAPY: Observation.  INTERVAL HISTORY: Larry [name] 63 y.o. male returns to the clinic today for followup visit accompanied by his daughter. The patient is feeling fine today with no specific complaints except for mild fatigue. He denied having any recurrent infection. He had several studies performed for evaluation of his leukocytopenia including vitamin B12, serum folate, ANA, hepatitis panel, HIV and rheumatoid factor. These studies were unremarkable. The patient denied having any significant weight loss or night sweats. He denied having any chest pain, shortness of breath, cough or hemoptysis. He has repeat CBC performed earlier today and he is here for evaluation and discussion of his lab results.  MEDICAL HISTORY: Past Medical History  Diagnosis Date  . MS (multiple sclerosis)   . Diabetes mellitus   . Hypertension   . Dementia   . Abnormality of gait   . Acute delirium 12/11/2011    ALLERGIES:  has No Known Allergies.  MEDICATIONS:  Current Outpatient Prescriptions  Medication Sig Dispense Refill  . ACCU-CHEK AVIVA PLUS test strip       . amitriptyline (ELAVIL) 25 MG tablet Take 1 tablet by mouth at  bedtime  90 tablet  1  . aspirin 81 MG tablet Take 81 mg by mouth daily.      . AVONEX PEN 30 MCG/0.5ML injection Inject intramuscularly  41mg every week.  3 each  4  . baclofen (LIORESAL) 10 MG tablet Take 2 tablets (20 mg total) by mouth 3 (three) times daily.  540 each  0  . Blood Glucose Calibration (ACCU-CHEK AVIVA) SOLN       . carbamazepine (TEGRETOL) 100 MG chewable tablet CHEW AND SWALLOW 2 TABLETS BY MOUTH TWICE DAILY  120 tablet  0  . cholecalciferol (VITAMIN D) 1000  UNITS tablet Take 1,000 Units by mouth daily.      . cloNIDine (CATAPRES) 0.1 MG tablet TAKE ONE TABLET BY MOUTH TWICE DAILY  180 tablet  0  . gabapentin (NEURONTIN) 800 MG tablet TAKE ONE TABLET BY MOUTH TWICE DAILY AND TAKE TWO AT BEDTIME  120 tablet  3  . Lancets (ACCU-CHEK MULTICLIX) lancets Use to check blood glucose once daily  100 each  12  . metFORMIN (GLUCOPHAGE) 1000 MG tablet TAKE ONE TABLET BY MOUTH TWICE DAILY WITH MEALS  60 tablet  3  . Multiple Vitamin (MULTIVITAMIN) tablet Take 1 tablet by mouth daily.      . Olmesartan-Amlodipine-HCTZ (TRIBENZOR) 40-10-12.5 MG TABS Take 1 tablet by mouth daily.  30 tablet  3  . pioglitazone (ACTOS) 30 MG tablet TAKE ONE TABLET BY MOUTH EVERY DAY  30 tablet  3  . traMADol (ULTRAM) 50 MG tablet TAKE ONE TABLET BY MOUTH EVERY 6 HOURS AS NEEDED FOR PAIN  120 tablet  0  . VESICARE 10 MG tablet Take 1 tablet by mouth  daily  90 tablet  0  . [DISCONTINUED] solifenacin (VESICARE) 10 MG tablet Take 5 mg by mouth daily.       No current facility-administered medications for this visit.    SURGICAL HISTORY:  Past Surgical History  Procedure Laterality Date  . Lumbar laminectomy    . Back surgery      REVIEW OF SYSTEMS:  A  comprehensive review of systems was negative except for: Constitutional: positive for fatigue   PHYSICAL EXAMINATION: General appearance: alert, cooperative, fatigued and no distress Head: Normocephalic, without obvious abnormality, atraumatic Neck: no adenopathy, no JVD, supple, symmetrical, trachea midline and thyroid not enlarged, symmetric, no tenderness/mass/nodules Lymph nodes: Cervical, supraclavicular, and axillary nodes normal. Resp: clear to auscultation bilaterally Back: symmetric, no curvature. ROM normal. No CVA tenderness. Cardio: regular rate and rhythm, S1, S2 normal, no murmur, click, rub or gallop GI: soft, non-tender; bowel sounds normal; no masses,  no organomegaly Extremities: extremities normal,  atraumatic, no cyanosis or edema  ECOG PERFORMANCE STATUS: 1 - Symptomatic but completely ambulatory  Blood pressure 161/80, pulse 102, temperature 98.2 F (36.8 C), temperature source Oral, resp. rate 18, height '5\' 7"'  (1.702 m), weight 168 lb 8 oz (76.431 kg).  LABORATORY DATA: Lab Results  Component Value Date   WBC 3.8* 07/24/2013   HGB 10.8* 07/24/2013   HCT 33.6* 07/24/2013   MCV 95.0 07/24/2013   PLT 266 07/24/2013      Chemistry      Component Value Date/Time   NA 143 05/22/2013 1115   NA 136 05/09/2013 1115   K 4.2 05/22/2013 1115   K 3.8 05/09/2013 1115   CL 103 05/09/2013 1115   CO2 25 05/22/2013 1115   CO2 24 05/09/2013 1115   BUN 13.4 05/22/2013 1115   BUN 18 05/09/2013 1115   CREATININE 0.9 05/22/2013 1115   CREATININE 0.8 05/09/2013 1115   CREATININE 0.84 12/25/2011 1132      Component Value Date/Time   CALCIUM 9.7 05/22/2013 1115   CALCIUM 8.9 05/09/2013 1115   ALKPHOS 85 05/22/2013 1115   ALKPHOS 67 05/09/2013 1115   AST 29 05/22/2013 1115   AST 30 05/09/2013 1115   ALT 18 05/22/2013 1115   ALT 19 05/09/2013 1115   BILITOT <0.20 05/22/2013 1115   BILITOT 0.4 05/09/2013 1115       RADIOGRAPHIC STUDIES: No results found.  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 His previous bloodwork was unremarkable for any abnormality to explain the leukocytopenia. I Had lengthy discussion with the patient today about his current lab results. I gave the patient the option of continuous observation and repeat CBC in 3 months versus proceeding with a bone marrow biopsy and aspirate to rule out any myelodysplastic syndrome. The patient would like to continue on observation for now. He was advised to call immediately if he has any concerning symptoms in the interval. The patient voices understanding of current disease status and treatment options and is in  agreement with the current care plan.  All questions were answered. The patient knows to call the clinic with any problems, questions or concerns. We can certainly see the patient much sooner if necessary.  Disclaimer: This note was dictated with voice recognition software. Similar sounding words can inadvertently be transcribed and may not be corrected upon review.

## 2013-07-24 NOTE — Telephone Encounter (Signed)
gv adn printed aptps ched and avs for pt for May.... °

## 2013-08-16 ENCOUNTER — Other Ambulatory Visit: Payer: Self-pay | Admitting: Family

## 2013-08-17 ENCOUNTER — Other Ambulatory Visit: Payer: Self-pay | Admitting: Family

## 2013-08-25 ENCOUNTER — Other Ambulatory Visit: Payer: Self-pay | Admitting: Family

## 2013-09-05 ENCOUNTER — Other Ambulatory Visit: Payer: Self-pay | Admitting: Family

## 2013-09-14 ENCOUNTER — Other Ambulatory Visit: Payer: Self-pay | Admitting: Family

## 2013-09-22 ENCOUNTER — Other Ambulatory Visit: Payer: Self-pay | Admitting: Family

## 2013-09-26 ENCOUNTER — Telehealth: Payer: Self-pay | Admitting: Family

## 2013-09-26 MED ORDER — INTERFERON BETA-1A 30 MCG/0.5ML IM KIT
PACK | INTRAMUSCULAR | Status: DC
Start: 2013-09-26 — End: 2013-09-29

## 2013-09-26 NOTE — Telephone Encounter (Signed)
Pt requesting refill of AVONEX PEN 30 MCG/0.5ML injection. Please call [phone] and choose option 2 or send electronically.  Please advise.

## 2013-09-29 ENCOUNTER — Encounter: Payer: Self-pay | Admitting: Internal Medicine

## 2013-09-29 ENCOUNTER — Ambulatory Visit (INDEPENDENT_AMBULATORY_CARE_PROVIDER_SITE_OTHER): Payer: Medicare Other | Admitting: Family

## 2013-09-29 ENCOUNTER — Encounter: Payer: Self-pay | Admitting: Family

## 2013-09-29 VITALS — BP 148/80 | HR 87 | Temp 98.5°F | Wt 158.0 lb

## 2013-09-29 DIAGNOSIS — G35 Multiple sclerosis: Secondary | ICD-10-CM

## 2013-09-29 DIAGNOSIS — R630 Anorexia: Secondary | ICD-10-CM

## 2013-09-29 DIAGNOSIS — E7800 Pure hypercholesterolemia, unspecified: Secondary | ICD-10-CM

## 2013-09-29 DIAGNOSIS — E119 Type 2 diabetes mellitus without complications: Secondary | ICD-10-CM

## 2013-09-29 DIAGNOSIS — I10 Essential (primary) hypertension: Secondary | ICD-10-CM

## 2013-09-29 DIAGNOSIS — R634 Abnormal weight loss: Secondary | ICD-10-CM

## 2013-09-29 LAB — HEPATIC FUNCTION PANEL
ALBUMIN: 4.5 g/dL (ref 3.5–5.2)
ALT: 17 U/L (ref 0–53)
AST: 29 U/L (ref 0–37)
Alkaline Phosphatase: 56 U/L (ref 39–117)
Bilirubin, Direct: 0 mg/dL (ref 0.0–0.3)
TOTAL PROTEIN: 7.2 g/dL (ref 6.0–8.3)
Total Bilirubin: 0.3 mg/dL (ref 0.3–1.2)

## 2013-09-29 LAB — LIPID PANEL
CHOLESTEROL: 141 mg/dL (ref 0–200)
HDL: 45.1 mg/dL (ref >39.00)
LDL Cholesterol: 78 mg/dL (ref 0–99)
Total CHOL/HDL Ratio: 3
Triglycerides: 91 mg/dL (ref 0.0–149.0)
VLDL: 18.2 mg/dL (ref 0.0–40.0)

## 2013-09-29 LAB — BASIC METABOLIC PANEL
BUN: 16 mg/dL (ref 6–23)
CO2: 26 meq/L (ref 19–32)
Calcium: 9.9 mg/dL (ref 8.4–10.5)
Chloride: 103 mEq/L (ref 96–112)
Creatinine, Ser: 0.9 mg/dL (ref 0.4–1.5)
GFR: 89.97 mL/min (ref >60.00)
GLUCOSE: 99 mg/dL (ref 70–99)
POTASSIUM: 3.9 meq/L (ref 3.5–5.1)
Sodium: 139 mEq/L (ref 135–145)

## 2013-09-29 LAB — HEMOGLOBIN A1C: HEMOGLOBIN A1C: 6.4 % (ref 4.6–6.5)

## 2013-09-29 MED ORDER — INTERFERON BETA-1A 30 MCG/0.5ML IM KIT: PACK | INTRAMUSCULAR | Status: DC

## 2013-09-29 NOTE — Patient Instructions (Signed)

## 2013-09-29 NOTE — Progress Notes (Signed)
Subjective:    Patient ID: Larry [name], male    DOB: 16-Apr-1951, 63 y.o.   MRN: [id number]  HPI  63 year old AAM, nonsmoker, is in today for a recheck of Hypertension, Type 2 Diabetes, Multiple Sclerosis, and hypercholesterolemia. He reports decreased appetite over the last 2 months and 10 lbs weight loss. Reports exercising more recently. Last colonoscopy 2007. Denies blood in the stool or dark black stool.  Review of Systems  Constitutional: Negative.   HENT: Negative.   Respiratory: Negative.   Cardiovascular: Negative.   Gastrointestinal: Negative.   Endocrine: Negative.   Genitourinary: Negative.   Musculoskeletal: Negative.   Skin: Negative.   Neurological: Negative.   Hematological: Negative.   Psychiatric/Behavioral: Negative.    Past Medical History  Diagnosis Date  . MS (multiple sclerosis)   . Diabetes mellitus   . Hypertension   . Dementia   . Abnormality of gait   . Acute delirium 12/11/2011    History   Social History  . Marital Status: Legally Separated    Spouse Name: N/A    Number of Children: N/A  . Years of Education: N/A   Occupational History  . Disability    Social History Main Topics  . Smoking status: Never Smoker   . Smokeless tobacco: Not on file  . Alcohol Use: No  . Drug Use: No  . Sexual Activity: Not Currently   Other Topics Concern  . Not on file   Social History Narrative  . No narrative on file    Past Surgical History  Procedure Laterality Date  . Lumbar laminectomy    . Back surgery      No family history on file.  No Known Allergies  Current Outpatient Prescriptions on File Prior to Visit  Medication Sig Dispense Refill  . ACCU-CHEK AVIVA PLUS test strip       . amitriptyline (ELAVIL) 25 MG tablet Take 1 tablet by mouth at  bedtime  90 tablet  1  . aspirin 81 MG tablet Take 81 mg by mouth daily.      . baclofen (LIORESAL) 10 MG tablet Take 2 tablets (20 mg total) by mouth 3 (three) times daily.  540 each   0  . Blood Glucose Calibration (ACCU-CHEK AVIVA) SOLN       . carbamazepine (TEGRETOL) 100 MG chewable tablet CHEW AND SWALLOW 2 TABLETS BY MOUTH TWICE DAILY  120 tablet  0  . cholecalciferol (VITAMIN D) 1000 UNITS tablet Take 1,000 Units by mouth daily.      . cloNIDine (CATAPRES) 0.1 MG tablet TAKE ONE TABLET BY MOUTH TWICE DAILY  180 tablet  0  . gabapentin (NEURONTIN) 800 MG tablet TAKE ONE TABLET BY MOUTH TWICE DAILY AND TAKE TWO AT BEDTIME  120 tablet  3  . Lancets (ACCU-CHEK MULTICLIX) lancets Use to check blood glucose once daily  100 each  12  . metFORMIN (GLUCOPHAGE) 1000 MG tablet TAKE ONE TABLET BY MOUTH TWICE DAILY WITH MEALS  60 tablet  3  . metFORMIN (GLUCOPHAGE) 1000 MG tablet Take 1 tablet by mouth two  times daily with meals  180 tablet  0  . Multiple Vitamin (MULTIVITAMIN) tablet Take 1 tablet by mouth daily.      . pioglitazone (ACTOS) 30 MG tablet TAKE ONE TABLET BY MOUTH EVERY DAY  30 tablet  3  . traMADol (ULTRAM) 50 MG tablet TAKE ONE TABLET BY MOUTH EVERY 6 HOURS AS NEEDED FOR PAIN  120 tablet  1  .  TRIBENZOR 40-10-12.5 MG TABS Take 1 tablet by mouth  daily  90 tablet  0  . VESICARE 10 MG tablet Take 1 tablet by mouth  daily  30 tablet  3  . [DISCONTINUED] solifenacin (VESICARE) 10 MG tablet Take 5 mg by mouth daily.       No current facility-administered medications on file prior to visit.    BP 148/80  Pulse 87  Temp(Src) 98.5 F (36.9 C) (Oral)  Wt 158 lb (71.668 kg)  SpO2 99%chart     Objective:   Physical Exam  Constitutional: He is oriented to person, place, and time. He appears well-developed and well-nourished.  HENT:  Right Ear: External ear normal.  Left Ear: External ear normal.  Nose: Nose normal.  Mouth/Throat: Oropharynx is clear and moist.  Neck: Normal range of motion. Neck supple.  Cardiovascular: Normal rate, regular rhythm and normal heart sounds.   Pulmonary/Chest: Effort normal and breath sounds normal.  Abdominal: Soft. Bowel sounds  are normal.  Musculoskeletal: Normal range of motion.  Neurological: He is alert and oriented to person, place, and time.  Skin: Skin is warm and dry.  Psychiatric: He has a normal mood and affect.          Assessment & Plan:  Reiner was seen today for follow-up.  Diagnoses and associated orders for this visit:  Multiple sclerosis - Hepatic Function Panel  Unspecified essential hypertension - Hepatic Function Panel  Type II or unspecified type diabetes mellitus without mention of complication, not stated as uncontrolled - Hemoglobin A1C - Hepatic Function Panel  Loss of weight - Lipid Panel - Basic Metabolic Panel - Hepatic Function Panel - Ambulatory referral to Gastroenterology  Decreased appetite - Lipid Panel - Basic Metabolic Panel - Hepatic Function Panel - Ambulatory referral to Gastroenterology  Pure hypercholesterolemia - Lipid Panel - Basic Metabolic Panel - Hepatic Function Panel  Other Orders - interferon beta-1a (AVONEX PEN) 30 MCG/0.5ML injection; Inject intramuscularly  25mcg every week.   Call the office with any questions or concerns. Recheck in 4 months and sooner as needed.

## 2013-09-29 NOTE — Progress Notes (Signed)
Pre visit review using our clinic review tool, if applicable. No additional management support is needed unless otherwise documented below in the visit note. 

## 2013-10-19 ENCOUNTER — Other Ambulatory Visit: Payer: Self-pay | Admitting: Family

## 2013-10-20 ENCOUNTER — Telehealth: Payer: Self-pay | Admitting: Internal Medicine

## 2013-10-20 NOTE — Telephone Encounter (Signed)
s.w. pt and r.s appt per pt request.....pt aware of new d.t °

## 2013-10-24 ENCOUNTER — Ambulatory Visit: Payer: Medicare Other | Admitting: Internal Medicine

## 2013-10-24 ENCOUNTER — Other Ambulatory Visit: Payer: Medicare Other

## 2013-11-20 ENCOUNTER — Telehealth: Payer: Self-pay | Admitting: Internal Medicine

## 2013-11-20 NOTE — Telephone Encounter (Signed)
returned pt call and r/s appt per pt redquest...pt aware of new d.t

## 2013-11-21 ENCOUNTER — Ambulatory Visit: Payer: Medicare Other | Admitting: Internal Medicine

## 2013-11-21 ENCOUNTER — Other Ambulatory Visit: Payer: Medicare Other

## 2013-11-23 ENCOUNTER — Ambulatory Visit: Payer: Medicare Other | Admitting: Internal Medicine

## 2013-12-12 ENCOUNTER — Other Ambulatory Visit: Payer: Self-pay | Admitting: Family

## 2013-12-21 ENCOUNTER — Other Ambulatory Visit: Payer: Self-pay | Admitting: Family

## 2013-12-27 ENCOUNTER — Other Ambulatory Visit: Payer: Self-pay | Admitting: Family

## 2014-01-02 ENCOUNTER — Other Ambulatory Visit: Payer: Self-pay | Admitting: Family

## 2014-01-02 NOTE — Telephone Encounter (Signed)
Fax RX please

## 2014-01-03 ENCOUNTER — Other Ambulatory Visit: Payer: Self-pay | Admitting: *Deleted

## 2014-01-03 ENCOUNTER — Other Ambulatory Visit: Payer: Self-pay | Admitting: Family

## 2014-01-03 DIAGNOSIS — D72819 Decreased white blood cell count, unspecified: Secondary | ICD-10-CM

## 2014-01-04 ENCOUNTER — Telehealth: Payer: Self-pay | Admitting: Internal Medicine

## 2014-01-04 ENCOUNTER — Encounter: Payer: Self-pay | Admitting: Internal Medicine

## 2014-01-04 ENCOUNTER — Other Ambulatory Visit (HOSPITAL_BASED_OUTPATIENT_CLINIC_OR_DEPARTMENT_OTHER): Payer: Medicare Other

## 2014-01-04 ENCOUNTER — Ambulatory Visit (HOSPITAL_BASED_OUTPATIENT_CLINIC_OR_DEPARTMENT_OTHER): Payer: Medicare Other | Admitting: Internal Medicine

## 2014-01-04 VITALS — BP 146/85 | HR 101 | Temp 98.7°F | Resp 20 | Ht 67.0 in | Wt 167.9 lb

## 2014-01-04 DIAGNOSIS — D638 Anemia in other chronic diseases classified elsewhere: Secondary | ICD-10-CM

## 2014-01-04 DIAGNOSIS — D72819 Decreased white blood cell count, unspecified: Secondary | ICD-10-CM

## 2014-01-04 LAB — COMPREHENSIVE METABOLIC PANEL (CC13)
ALBUMIN: 4.6 g/dL (ref 3.5–5.0)
ALK PHOS: 64 U/L (ref 40–150)
ALT: 17 U/L (ref 0–55)
AST: 30 U/L (ref 5–34)
Anion Gap: 11 mEq/L (ref 3–11)
BUN: 18.1 mg/dL (ref 7.0–26.0)
CO2: 28 mEq/L (ref 22–29)
Calcium: 10.2 mg/dL (ref 8.4–10.4)
Chloride: 105 mEq/L (ref 98–109)
Creatinine: 1 mg/dL (ref 0.7–1.3)
Glucose: 113 mg/dl (ref 70–140)
POTASSIUM: 3.8 meq/L (ref 3.5–5.1)
SODIUM: 144 meq/L (ref 136–145)
TOTAL PROTEIN: 7.4 g/dL (ref 6.4–8.3)
Total Bilirubin: 0.41 mg/dL (ref 0.20–1.20)

## 2014-01-04 LAB — CBC WITH DIFFERENTIAL/PLATELET
BASO%: 0.7 % (ref 0.0–2.0)
Basophils Absolute: 0 10*3/uL (ref 0.0–0.1)
EOS%: 2.1 % (ref 0.0–7.0)
Eosinophils Absolute: 0.1 10*3/uL (ref 0.0–0.5)
HCT: 35.7 % — ABNORMAL LOW (ref 38.4–49.9)
HGB: 11.5 g/dL — ABNORMAL LOW (ref 13.0–17.1)
LYMPH#: 0.8 10*3/uL — AB (ref 0.9–3.3)
LYMPH%: 22.8 % (ref 14.0–49.0)
MCH: 31.2 pg (ref 27.2–33.4)
MCHC: 32.3 g/dL (ref 32.0–36.0)
MCV: 96.4 fL (ref 79.3–98.0)
MONO#: 0.2 10*3/uL (ref 0.1–0.9)
MONO%: 6 % (ref 0.0–14.0)
NEUT%: 68.4 % (ref 39.0–75.0)
NEUTROS ABS: 2.3 10*3/uL (ref 1.5–6.5)
Platelets: 266 10*3/uL (ref 140–400)
RBC: 3.7 10*6/uL — AB (ref 4.20–5.82)
RDW: 14.4 % (ref 11.0–14.6)
WBC: 3.3 10*3/uL — ABNORMAL LOW (ref 4.0–10.3)

## 2014-01-04 LAB — LACTATE DEHYDROGENASE (CC13): LDH: 239 U/L (ref 125–245)

## 2014-01-04 NOTE — Telephone Encounter (Signed)
Pt confirmed labs/ov per 08/06 POF, gave pt AVS....KJ °

## 2014-01-04 NOTE — Progress Notes (Signed)
[facility] Telephone:(336) [phone]   Fax:(336) [phone]  OFFICE PROGRESS NOTE  CAMPBELL, Hillard Danker, FNP [address] 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 2) anemia of chronic disease.  PRIOR THERAPY: None  CURRENT THERAPY: Observation.  INTERVAL HISTORY: Larry [name] 63 y.o. male returns to the clinic today for followup visit accompanied by his daughter. He has been observation for the last several months. The patient is feeling fine today with no specific complaints except for persistent fatigue. He denied having any recurrent infection.The patient denied having any significant weight loss or night sweats. He denied having any chest pain, shortness of breath, cough or hemoptysis. He has repeat CBC performed earlier today and he is here for evaluation and discussion of his lab results.  MEDICAL HISTORY: Past Medical History  Diagnosis Date  . MS (multiple sclerosis)   . Diabetes mellitus   . Hypertension   . Dementia   . Abnormality of gait   . Acute delirium 12/11/2011  . Hypercholesterolemia     ALLERGIES:  has No Known Allergies.  MEDICATIONS:  Current Outpatient Prescriptions  Medication Sig Dispense Refill  . ACCU-CHEK AVIVA PLUS test strip       . amitriptyline (ELAVIL) 25 MG tablet Take 1 tablet by mouth at  bedtime  90 tablet  0  . aspirin 81 MG tablet Take 81 mg by mouth daily.      . AZOR 5-40 MG per tablet Take 1 tablet by mouth  every day  90 tablet  00  . baclofen (LIORESAL) 10 MG tablet TAKE TWO TABLETS BY MOUTH THREE TIMES DAILY  540 tablet  0  . Blood Glucose Calibration (ACCU-CHEK AVIVA) SOLN       . cholecalciferol (VITAMIN D) 1000 UNITS tablet Take 1,000 Units by mouth daily.      . cloNIDine (CATAPRES) 0.1 MG tablet TAKE ONE TABLET BY MOUTH TWICE DAILY  180 tablet  1  . gabapentin (NEURONTIN) 800 MG tablet Take 1 tablet by mouth two  times daily and 2 tablets  at bedtime  360  tablet  0  . interferon beta-1a (AVONEX PEN) 30 MCG/0.5ML injection Inject intramuscularly  11mg every week.  3 each  4  . Lancets (ACCU-CHEK MULTICLIX) lancets Use to check blood glucose once daily  100 each  12  . metFORMIN (GLUCOPHAGE) 1000 MG tablet TAKE ONE TABLET BY MOUTH TWICE DAILY WITH MEALS  60 tablet  3  . metFORMIN (GLUCOPHAGE) 1000 MG tablet Take 1 tablet by mouth two  times daily with meals  180 tablet  0  . metFORMIN (GLUCOPHAGE) 1000 MG tablet Take 1 tablet by mouth two  times daily with meals  180 tablet  0  . Multiple Vitamin (MULTIVITAMIN) tablet Take 1 tablet by mouth daily.      . pioglitazone (ACTOS) 30 MG tablet Take 1 tablet by mouth  every day  90 tablet  0  . traMADol (ULTRAM) 50 MG tablet TAKE ONE TABLET BY MOUTH EVERY 6 HOURS AS NEEDED FOR PAIN  120 tablet  1  . TRIBENZOR 40-10-12.5 MG TABS Take 1 tablet by mouth  daily  90 tablet  0  . VESICARE 10 MG tablet Take 1 tablet by mouth  daily  90 tablet  0  . [DISCONTINUED] solifenacin (VESICARE) 10 MG tablet Take 5 mg by mouth daily.       No current facility-administered medications for this visit.  SURGICAL HISTORY:  Past Surgical History  Procedure Laterality Date  . Lumbar laminectomy    . Back surgery      REVIEW OF SYSTEMS:  A comprehensive review of systems was negative except for: Constitutional: positive for fatigue   PHYSICAL EXAMINATION: General appearance: alert, cooperative, fatigued and no distress Head: Normocephalic, without obvious abnormality, atraumatic Neck: no adenopathy, no JVD, supple, symmetrical, trachea midline and thyroid not enlarged, symmetric, no tenderness/mass/nodules Lymph nodes: Cervical, supraclavicular, and axillary nodes normal. Resp: clear to auscultation bilaterally Back: symmetric, no curvature. ROM normal. No CVA tenderness. Cardio: regular rate and rhythm, S1, S2 normal, no murmur, click, rub or gallop GI: soft, non-tender; bowel sounds normal; no masses,  no  organomegaly Extremities: extremities normal, atraumatic, no cyanosis or edema  ECOG PERFORMANCE STATUS: 1 - Symptomatic but completely ambulatory  Blood pressure 146/85, pulse 101, temperature 98.7 F (37.1 C), temperature source Oral, resp. rate 20, height '5\' 7"'  (1.702 m), weight 167 lb 14.4 oz (76.159 kg), SpO2 100.00%.  LABORATORY DATA: Lab Results  Component Value Date   WBC 3.3* 01/04/2014   HGB 11.5* 01/04/2014   HCT 35.7* 01/04/2014   MCV 96.4 01/04/2014   PLT 266 01/04/2014      Chemistry      Component Value Date/Time   NA 144 01/04/2014 0943   NA 139 09/29/2013 1102   K 3.8 01/04/2014 0943   K 3.9 09/29/2013 1102   CL 103 09/29/2013 1102   CO2 28 01/04/2014 0943   CO2 26 09/29/2013 1102   BUN 18.1 01/04/2014 0943   BUN 16 09/29/2013 1102   CREATININE 1.0 01/04/2014 0943   CREATININE 0.9 09/29/2013 1102   CREATININE 0.84 12/25/2011 1132      Component Value Date/Time   CALCIUM 10.2 01/04/2014 0943   CALCIUM 9.9 09/29/2013 1102   ALKPHOS 64 01/04/2014 0943   ALKPHOS 56 09/29/2013 1102   AST 30 01/04/2014 0943   AST 29 09/29/2013 1102   ALT 17 01/04/2014 0943   ALT 17 09/29/2013 1102   BILITOT 0.41 01/04/2014 0943   BILITOT 0.3 09/29/2013 1102       RADIOGRAPHIC STUDIES: No results found.  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 His CBC showed low total white blood count and absolute neutrophil count but stable compared to the prior lab. I discussed with the patient and consideration of bone marrow biopsy and aspirate again but he still very reluctant to consider this procedure. He would like to continue on observation and I would see him back for followup visit in 6 months with repeat CBC, comprehensive metabolic panel and LDH. He will let me know if he changes his mind regarding the bone marrow biopsy procedure. He was advised to call immediately if he has any concerning symptoms in  the interval. The patient voices understanding of current disease status and treatment options and is in agreement with the current care plan.  All questions were answered. The patient knows to call the clinic with any problems, questions or concerns. We can certainly see the patient much sooner if necessary.  Disclaimer: This note was dictated with voice recognition software. Similar sounding words can inadvertently be transcribed and may not be corrected upon review.

## 2014-01-10 ENCOUNTER — Other Ambulatory Visit: Payer: Self-pay | Admitting: Family

## 2014-01-10 ENCOUNTER — Telehealth: Payer: Self-pay | Admitting: Family

## 2014-01-10 MED ORDER — CLONIDINE HCL 0.1 MG PO TABS: ORAL_TABLET | ORAL | Status: DC

## 2014-01-10 NOTE — Telephone Encounter (Signed)
[facility] is requesting re-fill on cloNIDine (CATAPRES) 0.1 MG tablet

## 2014-01-10 NOTE — Telephone Encounter (Signed)
Done

## 2014-01-26 ENCOUNTER — Other Ambulatory Visit: Payer: Self-pay | Admitting: Family

## 2014-01-30 ENCOUNTER — Ambulatory Visit: Payer: Medicare Other | Admitting: Family

## 2014-01-30 ENCOUNTER — Ambulatory Visit: Payer: Medicare Other | Admitting: Internal Medicine

## 2014-02-08 ENCOUNTER — Other Ambulatory Visit: Payer: Self-pay | Admitting: Family

## 2014-02-14 ENCOUNTER — Other Ambulatory Visit: Payer: Self-pay

## 2014-02-14 MED ORDER — GABAPENTIN 800 MG PO TABS: ORAL_TABLET | ORAL | Status: DC

## 2014-03-05 ENCOUNTER — Encounter: Payer: Self-pay | Admitting: Family

## 2014-03-05 ENCOUNTER — Ambulatory Visit (INDEPENDENT_AMBULATORY_CARE_PROVIDER_SITE_OTHER): Payer: Medicare Other | Admitting: Family

## 2014-03-05 VITALS — BP 130/82 | HR 58 | Ht 67.0 in | Wt 168.0 lb

## 2014-03-05 DIAGNOSIS — E7800 Pure hypercholesterolemia, unspecified: Secondary | ICD-10-CM

## 2014-03-05 DIAGNOSIS — Z23 Encounter for immunization: Secondary | ICD-10-CM

## 2014-03-05 DIAGNOSIS — G35 Multiple sclerosis: Secondary | ICD-10-CM

## 2014-03-05 DIAGNOSIS — E118 Type 2 diabetes mellitus with unspecified complications: Secondary | ICD-10-CM

## 2014-03-05 DIAGNOSIS — M5416 Radiculopathy, lumbar region: Secondary | ICD-10-CM

## 2014-03-05 DIAGNOSIS — I10 Essential (primary) hypertension: Secondary | ICD-10-CM

## 2014-03-05 MED ORDER — DICLOFENAC SODIUM 75 MG PO TBEC: 75.0000 mg | DELAYED_RELEASE_TABLET | Freq: Two times a day (BID) | ORAL | Status: DC

## 2014-03-05 NOTE — Progress Notes (Signed)
Pre visit review using our clinic review tool, if applicable. No additional management support is needed unless otherwise documented below in the visit note. 

## 2014-03-05 NOTE — Patient Instructions (Addendum)
Back Exercises These exercises may help you when beginning to rehabilitate your injury. Your symptoms may resolve with or without further involvement from your physician, physical therapist or athletic trainer. While completing these exercises, remember:   Restoring tissue flexibility helps normal motion to return to the joints. This allows healthier, less painful movement and activity.  An effective stretch should be held for at least 30 seconds.  A stretch should never be painful. You should only feel a gentle lengthening or release in the stretched tissue. STRETCH - Extension, Prone on Elbows   Lie on your stomach on the floor, a bed will be too soft. Place your palms about shoulder width apart and at the height of your head.  Place your elbows under your shoulders. If this is too painful, stack pillows under your chest.  Allow your body to relax so that your hips drop lower and make contact more completely with the floor.  Hold this position for __________ seconds.  Slowly return to lying flat on the floor. Repeat __________ times. Complete this exercise __________ times per day.  RANGE OF MOTION - Extension, Prone Press Ups   Lie on your stomach on the floor, a bed will be too soft. Place your palms about shoulder width apart and at the height of your head.  Keeping your back as relaxed as possible, slowly straighten your elbows while keeping your hips on the floor. You may adjust the placement of your hands to maximize your comfort. As you gain motion, your hands will come more underneath your shoulders.  Hold this position __________ seconds.  Slowly return to lying flat on the floor. Repeat __________ times. Complete this exercise __________ times per day.  RANGE OF MOTION- Quadruped, Neutral Spine   Assume a hands and knees position on a firm surface. Keep your hands under your shoulders and your knees under your hips. You may place padding under your knees for  comfort.  Drop your head and point your tail bone toward the ground below you. This will round out your low back like an angry cat. Hold this position for __________ seconds.  Slowly lift your head and release your tail bone so that your back sags into a large arch, like an old horse.  Hold this position for __________ seconds.  Repeat this until you feel limber in your low back.  Now, find your "sweet spot." This will be the most comfortable position somewhere between the two previous positions. This is your neutral spine. Once you have found this position, tense your stomach muscles to support your low back.  Hold this position for __________ seconds. Repeat __________ times. Complete this exercise __________ times per day.  STRETCH - Flexion, Single Knee to Chest   Lie on a firm bed or floor with both legs extended in front of you.  Keeping one leg in contact with the floor, bring your opposite knee to your chest. Hold your leg in place by either grabbing behind your thigh or at your knee.  Pull until you feel a gentle stretch in your low back. Hold __________ seconds.  Slowly release your grasp and repeat the exercise with the opposite side. Repeat __________ times. Complete this exercise __________ times per day.  STRETCH - Hamstrings, Standing  Stand or sit and extend your right / left leg, placing your foot on a chair or foot stool  Keeping a slight arch in your low back and your hips straight forward.  Lead with your chest and   lean forward at the waist until you feel a gentle stretch in the back of your right / left knee or thigh. (When done correctly, this exercise requires leaning only a small distance.)  Hold this position for __________ seconds. Repeat __________ times. Complete this stretch __________ times per day. STRENGTHENING - Deep Abdominals, Pelvic Tilt   Lie on a firm bed or floor. Keeping your legs in front of you, bend your knees so they are both pointed  toward the ceiling and your feet are flat on the floor.  Tense your lower abdominal muscles to press your low back into the floor. This motion will rotate your pelvis so that your tail bone is scooping upwards rather than pointing at your feet or into the floor.  With a gentle tension and even breathing, hold this position for __________ seconds. Repeat __________ times. Complete this exercise __________ times per day.  STRENGTHENING - Abdominals, Crunches   Lie on a firm bed or floor. Keeping your legs in front of you, bend your knees so they are both pointed toward the ceiling and your feet are flat on the floor. Cross your arms over your chest.  Slightly tip your chin down without bending your neck.  Tense your abdominals and slowly lift your trunk high enough to just clear your shoulder blades. Lifting higher can put excessive stress on the low back and does not further strengthen your abdominal muscles.  Control your return to the starting position. Repeat __________ times. Complete this exercise __________ times per day.  STRENGTHENING - Quadruped, Opposite UE/LE Lift   Assume a hands and knees position on a firm surface. Keep your hands under your shoulders and your knees under your hips. You may place padding under your knees for comfort.  Find your neutral spine and gently tense your abdominal muscles so that you can maintain this position. Your shoulders and hips should form a rectangle that is parallel with the floor and is not twisted.  Keeping your trunk steady, lift your right hand no higher than your shoulder and then your left leg no higher than your hip. Make sure you are not holding your breath. Hold this position __________ seconds.  Continuing to keep your abdominal muscles tense and your back steady, slowly return to your starting position. Repeat with the opposite arm and leg. Repeat __________ times. Complete this exercise __________ times per day. Document Released:  06/05/2005 Document Revised: 08/10/2011 Document Reviewed: 08/30/2008 Exit[employer] Patient Information 2015 [employer]. This information is not intended to replace advice given to you by your health care provider. Make sure you discuss any questions you have with your health care provider.  

## 2014-03-05 NOTE — Progress Notes (Signed)
Subjective:    Patient ID: Larry [name], male    DOB: 1951/05/01, 63 y.o.   MRN: [id number]  Back Pain   and 63 year old [place] American male, nonsmoker with a history of type 2 diabetes, hypertension, multiple sclerosis, hyperlipidemia, overactive bladder is in today for recheck. Reports having low back pain that he rates as 6/10 that radiates to his buttocks.  Has a history of herniated disc. He exercises 3 times per week. He reports having difficulty controlling bowel movements when he lifts heavy weights. No loss of bladder control.    Review of Systems  Constitutional: Negative.   HENT: Negative.   Respiratory: Negative.   Cardiovascular: Negative.   Gastrointestinal: Negative.   Endocrine: Negative.   Genitourinary: Negative.         difficulty holding stools lifting heavy weights  Musculoskeletal: Positive for back pain.  Skin: Negative.   Allergic/Immunologic: Negative.   Neurological: Negative.   Psychiatric/Behavioral: Negative.    Past Medical History  Diagnosis Date  . MS (multiple sclerosis)   . Diabetes mellitus   . Hypertension   . Dementia   . Abnormality of gait   . Acute delirium 12/11/2011  . Hypercholesterolemia     History   Social History  . Marital Status: Legally Separated    Spouse Name: N/A    Number of Children: N/A  . Years of Education: N/A   Occupational History  . Disability    Social History Main Topics  . Smoking status: Never Smoker   . Smokeless tobacco: Not on file  . Alcohol Use: No  . Drug Use: No  . Sexual Activity: Not Currently   Other Topics Concern  . Not on file   Social History Narrative  . No narrative on file    Past Surgical History  Procedure Laterality Date  . Lumbar laminectomy    . Back surgery      No family history on file.  No Known Allergies  Current Outpatient Prescriptions on File Prior to Visit  Medication Sig Dispense Refill  . ACCU-CHEK AVIVA PLUS test strip       . amitriptyline  (ELAVIL) 25 MG tablet Take 1 tablet by mouth at  bedtime  90 tablet  1  . aspirin 81 MG tablet Take 81 mg by mouth daily.      . baclofen (LIORESAL) 10 MG tablet TAKE TWO TABLETS BY MOUTH THREE TIMES DAILY  540 tablet  0  . Blood Glucose Calibration (ACCU-CHEK AVIVA) SOLN       . cholecalciferol (VITAMIN D) 1000 UNITS tablet Take 1,000 Units by mouth daily.      . cloNIDine (CATAPRES) 0.1 MG tablet TAKE ONE TABLET BY MOUTH TWICE DAILY  180 tablet  1  . gabapentin (NEURONTIN) 800 MG tablet Take 1 tablet by mouth twice daily and 2 tablets at bedtime  360 tablet  1  . interferon beta-1a (AVONEX PEN) 30 MCG/0.5ML injection Inject intramuscularly  64mcg every week.  3 each  4  . Lancets (ACCU-CHEK MULTICLIX) lancets Use to check blood glucose once daily  100 each  12  . metFORMIN (GLUCOPHAGE) 1000 MG tablet Take 1 tablet by mouth two  times daily with meals  180 tablet  1  . Multiple Vitamin (MULTIVITAMIN) tablet Take 1 tablet by mouth daily.      . pioglitazone (ACTOS) 30 MG tablet Take 1 tablet by mouth  every day  90 tablet  1  . traMADol (ULTRAM) 50 MG tablet TAKE  ONE TABLET BY MOUTH EVERY 6 HOURS AS NEEDED FOR PAIN  120 tablet  1  . TRIBENZOR 40-10-12.5 MG TABS Take 1 tablet by mouth  daily  90 tablet  1  . VESICARE 10 MG tablet Take 1 tablet by mouth  daily  90 tablet  1  . [DISCONTINUED] solifenacin (VESICARE) 10 MG tablet Take 5 mg by mouth daily.       No current facility-administered medications on file prior to visit.    BP 130/82  Pulse 58  Ht 5\' 7"  (1.702 m)  Wt 168 lb (76.204 kg)  BMI 26.31 kg/m2chart    Objective:   Physical Exam  Constitutional: He is oriented to person, place, and time. He appears well-developed and well-nourished.  HENT:  Head: Normocephalic.  Right Ear: External ear normal.  Left Ear: External ear normal.  Nose: Nose normal.  Mouth/Throat: Oropharynx is clear and moist.  Neck: Normal range of motion. Neck supple. No thyromegaly present.    Cardiovascular: Normal rate, regular rhythm and normal heart sounds.   Pulmonary/Chest: Effort normal and breath sounds normal.  Abdominal: Soft. Bowel sounds are normal.  Musculoskeletal: Normal range of motion.  Ambulates with forearm crutch  Neurological: He is alert and oriented to person, place, and time. He has normal reflexes.  Skin: Skin is warm and dry.  Psychiatric: He has a normal mood and affect.          Assessment & Plan:  Larry [name] was seen today for follow-up and back pain.  Diagnoses and associated orders for this visit:  Type 2 diabetes mellitus with complication  Essential hypertension  Pure hypercholesterolemia  Lumbar radiculopathy, acute  Multiple sclerosis  Need for prophylactic vaccination against Streptococcus pneumoniae (pneumococcus) - Pneumococcal conjugate vaccine 13-valent  Need for prophylactic vaccination and inoculation against influenza - Flu vaccine HIGH DOSE PF (Fluzone Tri High dose)  Other Orders - diclofenac (VOLTAREN) 75 MG EC tablet; Take 1 tablet (75 mg total) by mouth 2 (two) times daily.    advised to take anti-inflammatory medication with food. And for short-term use. Call the office with any questions or concerns. Recheck in 6 months and sooner as needed.

## 2014-03-06 ENCOUNTER — Telehealth: Payer: Self-pay | Admitting: Family

## 2014-03-06 NOTE — Telephone Encounter (Signed)
emmi emailed °

## 2014-03-28 ENCOUNTER — Ambulatory Visit: Payer: Medicare Other | Admitting: Internal Medicine

## 2014-04-10 ENCOUNTER — Telehealth: Payer: Self-pay | Admitting: Family

## 2014-04-10 MED ORDER — BACLOFEN 10 MG PO TABS: ORAL_TABLET | ORAL | Status: DC

## 2014-04-10 NOTE — Telephone Encounter (Signed)
[id number] - [place] ([place] [place] is requesting re-fill on baclofen (LIORESAL) 10 MG tablet

## 2014-04-10 NOTE — Telephone Encounter (Signed)
Done

## 2014-04-25 ENCOUNTER — Other Ambulatory Visit: Payer: Self-pay | Admitting: Family

## 2014-04-25 ENCOUNTER — Other Ambulatory Visit: Payer: Self-pay | Admitting: Family Medicine

## 2014-05-16 ENCOUNTER — Other Ambulatory Visit: Payer: Self-pay | Admitting: Family Medicine

## 2014-05-16 ENCOUNTER — Other Ambulatory Visit: Payer: Self-pay | Admitting: Family

## 2014-05-17 ENCOUNTER — Telehealth: Payer: Self-pay

## 2014-05-17 MED ORDER — GABAPENTIN 800 MG PO TABS: ORAL_TABLET | ORAL | Status: DC

## 2014-05-17 NOTE — Telephone Encounter (Signed)
DONE

## 2014-05-17 NOTE — Telephone Encounter (Signed)
Rx request for Gabapentin 800 mg  Pharmacy:  [facility]  Pls advise.

## 2014-05-23 ENCOUNTER — Other Ambulatory Visit: Payer: Self-pay | Admitting: Family

## 2014-05-24 ENCOUNTER — Other Ambulatory Visit: Payer: Self-pay | Admitting: *Deleted

## 2014-05-24 MED ORDER — GLUCOSE BLOOD VI STRP: ORAL_STRIP | Status: DC

## 2014-06-05 ENCOUNTER — Other Ambulatory Visit: Payer: Self-pay | Admitting: Family

## 2014-06-08 ENCOUNTER — Telehealth: Payer: Self-pay | Admitting: *Deleted

## 2014-06-08 MED ORDER — CLONIDINE HCL 0.1 MG PO TABS: ORAL_TABLET | ORAL | Status: DC

## 2014-06-08 MED ORDER — OLMESARTAN-AMLODIPINE-HCTZ 40-10-12.5 MG PO TABS: ORAL_TABLET | ORAL | Status: DC

## 2014-06-08 MED ORDER — AMITRIPTYLINE HCL 25 MG PO TABS: ORAL_TABLET | ORAL | Status: DC

## 2014-06-08 MED ORDER — SOLIFENACIN SUCCINATE 10 MG PO TABS: ORAL_TABLET | ORAL | Status: DC

## 2014-06-08 NOTE — Telephone Encounter (Signed)
Patient requesting refills of amitriptyline, vesicare, clonidine, and tribenzor [facility]

## 2014-07-04 ENCOUNTER — Other Ambulatory Visit: Payer: Self-pay | Admitting: Family

## 2014-07-09 ENCOUNTER — Encounter: Payer: Self-pay | Admitting: Internal Medicine

## 2014-07-09 ENCOUNTER — Other Ambulatory Visit (HOSPITAL_BASED_OUTPATIENT_CLINIC_OR_DEPARTMENT_OTHER): Payer: Medicare Other

## 2014-07-09 ENCOUNTER — Ambulatory Visit (HOSPITAL_BASED_OUTPATIENT_CLINIC_OR_DEPARTMENT_OTHER): Payer: Medicare Other | Admitting: Internal Medicine

## 2014-07-09 VITALS — BP 167/87 | HR 101 | Temp 98.6°F | Resp 20 | Ht 67.0 in | Wt 165.7 lb

## 2014-07-09 DIAGNOSIS — D72819 Decreased white blood cell count, unspecified: Secondary | ICD-10-CM

## 2014-07-09 DIAGNOSIS — E119 Type 2 diabetes mellitus without complications: Secondary | ICD-10-CM

## 2014-07-09 DIAGNOSIS — I10 Essential (primary) hypertension: Secondary | ICD-10-CM

## 2014-07-09 DIAGNOSIS — R5383 Other fatigue: Secondary | ICD-10-CM

## 2014-07-09 LAB — CBC WITH DIFFERENTIAL/PLATELET
BASO%: 0.3 % (ref 0.0–2.0)
Basophils Absolute: 0 10*3/uL (ref 0.0–0.1)
EOS%: 2.3 % (ref 0.0–7.0)
Eosinophils Absolute: 0.1 10*3/uL (ref 0.0–0.5)
HCT: 33.5 % — ABNORMAL LOW (ref 38.4–49.9)
HGB: 10.9 g/dL — ABNORMAL LOW (ref 13.0–17.1)
LYMPH%: 14.4 % (ref 14.0–49.0)
MCH: 31.1 pg (ref 27.2–33.4)
MCHC: 32.5 g/dL (ref 32.0–36.0)
MCV: 95.7 fL (ref 79.3–98.0)
MONO#: 0.2 10*3/uL (ref 0.1–0.9)
MONO%: 5.7 % (ref 0.0–14.0)
NEUT%: 77.3 % — ABNORMAL HIGH (ref 39.0–75.0)
NEUTROS ABS: 2.7 10*3/uL (ref 1.5–6.5)
Platelets: 276 10*3/uL (ref 140–400)
RBC: 3.5 10*6/uL — ABNORMAL LOW (ref 4.20–5.82)
RDW: 14.4 % (ref 11.0–14.6)
WBC: 3.5 10*3/uL — AB (ref 4.0–10.3)
lymph#: 0.5 10*3/uL — ABNORMAL LOW (ref 0.9–3.3)

## 2014-07-09 LAB — LACTATE DEHYDROGENASE (CC13): LDH: 224 U/L (ref 125–245)

## 2014-07-09 NOTE — Progress Notes (Signed)
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 2) anemia of chronic disease.  PRIOR THERAPY: None  CURRENT THERAPY: Observation.  INTERVAL HISTORY: Larry [name] 64 y.o. male returns to the clinic today for followup visit accompanied by his daughter. The patient is feeling fine today with no specific complaints except for persistent fatigue. He denied having any recurrent infection.The patient denied having any significant weight loss or night sweats. He denied having any chest pain, shortness of breath, cough or hemoptysis. He has repeat CBC performed earlier today and he is here for evaluation and discussion of his lab results.   MEDICAL HISTORY: Past Medical History  Diagnosis Date  . MS (multiple sclerosis)   . Diabetes mellitus   . Hypertension   . Dementia   . Abnormality of gait   . Acute delirium 12/11/2011  . Hypercholesterolemia     ALLERGIES:  has No Known Allergies.  MEDICATIONS:  Current Outpatient Prescriptions  Medication Sig Dispense Refill  . amitriptyline (ELAVIL) 25 MG tablet Take 1 tablet by mouth at  bedtime  Rx written by: Roxy Cedar 90 tablet 0  . aspirin 81 MG tablet Take 81 mg by mouth daily.    . baclofen (LIORESAL) 10 MG tablet TAKE TWO TABLETS BY MOUTH THREE TIMES DAILY 540 tablet 0  . Blood Glucose Calibration (ACCU-CHEK AVIVA) SOLN     . cholecalciferol (VITAMIN D) 1000 UNITS tablet Take 1,000 Units by mouth daily.    . cloNIDine (CATAPRES) 0.1 MG tablet Take 1 tablet by mouth  twice a day 180 tablet 0  . Cyanocobalamin (VITAMIN B-12 PO) Take by mouth. Patient is unsure of dose    . diclofenac (VOLTAREN) 75 MG EC tablet TAKE ONE TABLET BY MOUTH TWICE DAILY 180 tablet 1  . gabapentin (NEURONTIN) 800 MG tablet Take 1 tablet by mouth twice daily and 2  tablets at bedtime 360 tablet 2  . glucose blood (ACCU-CHEK AVIVA PLUS) test strip Test once daily.  E11.8 dx 100 each 0  . interferon beta-1a (AVONEX PEN) 30 MCG/0.5ML injection Inject intramuscularly  46mcg every week. 3 each 4  . Lancets (ACCU-CHEK MULTICLIX) lancets Use to check blood glucose  once daily 102 each 3  . metFORMIN (GLUCOPHAGE) 1000 MG tablet Take 1 tablet by mouth two  times daily with meals  Rx written by: Roxy Cedar 180 tablet 1  . Multiple Vitamin (MULTIVITAMIN) tablet Take 1 tablet by mouth daily.    . Olmesartan-Amlodipine-HCTZ (TRIBENZOR PO) Take by mouth daily.    . solifenacin (VESICARE) 10 MG tablet Take 1 tablet by mouth  daily  Rx written by: Roxy Cedar 90 tablet 0  . traMADol (ULTRAM) 50 MG tablet TAKE ONE TABLET BY MOUTH EVERY 6 HOURS AS NEEDED FOR PAIN 120 tablet 2  . [DISCONTINUED] solifenacin (VESICARE) 10 MG tablet Take 5 mg by mouth daily.     No current facility-administered medications for this visit.    SURGICAL HISTORY:  Past Surgical History  Procedure Laterality Date  . Lumbar laminectomy    . Back surgery      REVIEW OF SYSTEMS:  A comprehensive review of systems was negative except for: Constitutional: positive for fatigue   PHYSICAL EXAMINATION: General appearance: alert, cooperative, fatigued and no distress Head: Normocephalic, without obvious abnormality, atraumatic Neck: no adenopathy,  no JVD, supple, symmetrical, trachea midline and thyroid not enlarged, symmetric, no tenderness/mass/nodules Lymph nodes: Cervical, supraclavicular, and axillary nodes normal. Resp: clear to auscultation bilaterally Back: symmetric, no curvature. ROM normal. No CVA tenderness. Cardio: regular rate and rhythm, S1, S2 normal, no murmur, click, rub or gallop GI: soft, non-tender; bowel sounds normal; no masses,  no organomegaly Extremities: extremities normal, atraumatic, no cyanosis or edema  ECOG PERFORMANCE STATUS: 1 - Symptomatic but  completely ambulatory  Blood pressure 167/87, pulse 101, temperature 98.6 F (37 C), temperature source Oral, resp. rate 20, height 5\' 7"  (1.702 m), weight 165 lb 11.2 oz (75.161 kg), SpO2 100 %.  LABORATORY DATA: Lab Results  Component Value Date   WBC 3.5* 07/09/2014   HGB 10.9* 07/09/2014   HCT 33.5* 07/09/2014   MCV 95.7 07/09/2014   PLT 276 07/09/2014      Chemistry      Component Value Date/Time   NA 144 01/04/2014 0943   NA 139 09/29/2013 1102   K 3.8 01/04/2014 0943   K 3.9 09/29/2013 1102   CL 103 09/29/2013 1102   CO2 28 01/04/2014 0943   CO2 26 09/29/2013 1102   BUN 18.1 01/04/2014 0943   BUN 16 09/29/2013 1102   CREATININE 1.0 01/04/2014 0943   CREATININE 0.9 09/29/2013 1102   CREATININE 0.84 12/25/2011 1132      Component Value Date/Time   CALCIUM 10.2 01/04/2014 0943   CALCIUM 9.9 09/29/2013 1102   ALKPHOS 64 01/04/2014 0943   ALKPHOS 56 09/29/2013 1102   AST 30 01/04/2014 0943   AST 29 09/29/2013 1102   ALT 17 01/04/2014 0943   ALT 17 09/29/2013 1102   BILITOT 0.41 01/04/2014 0943   BILITOT 0.3 09/29/2013 1102       RADIOGRAPHIC STUDIES: No results found.  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 He has a stable leukocyte and absolute neutrophil count as well as hemoglobin and hematocrit. I recommended for the patient to continue on observation with routine follow-up visit with his primary care physician. I'll be happy to see him in the future on an as-needed basis. He was advised to call immediately if he has any concerning symptoms in the interval. The patient voices understanding of current disease status and treatment options and is in agreement with the current care plan.  All questions were answered. The patient knows to call the clinic with any problems, questions or concerns. We can certainly see the patient much sooner  if necessary.  Disclaimer: This note was dictated with voice recognition software. Similar sounding words can inadvertently be transcribed and may not be corrected upon review.

## 2014-08-27 ENCOUNTER — Other Ambulatory Visit: Payer: Self-pay | Admitting: Family

## 2014-09-04 ENCOUNTER — Encounter: Payer: Medicare Other | Admitting: Family

## 2014-09-05 ENCOUNTER — Other Ambulatory Visit: Payer: Self-pay | Admitting: Family

## 2014-09-06 ENCOUNTER — Encounter: Payer: Medicare Other | Admitting: Family

## 2014-09-20 ENCOUNTER — Encounter: Payer: Medicare Other | Admitting: Family

## 2014-10-16 ENCOUNTER — Other Ambulatory Visit: Payer: Self-pay | Admitting: Family

## 2014-11-06 ENCOUNTER — Other Ambulatory Visit: Payer: Self-pay | Admitting: Family

## 2014-11-07 ENCOUNTER — Other Ambulatory Visit: Payer: Self-pay | Admitting: Family

## 2014-11-19 ENCOUNTER — Other Ambulatory Visit: Payer: Self-pay | Admitting: Family

## 2014-11-20 ENCOUNTER — Other Ambulatory Visit: Payer: Self-pay | Admitting: Family

## 2014-11-26 ENCOUNTER — Other Ambulatory Visit: Payer: Self-pay

## 2014-11-28 ENCOUNTER — Other Ambulatory Visit: Payer: Self-pay | Admitting: *Deleted

## 2014-11-28 ENCOUNTER — Telehealth: Payer: Self-pay | Admitting: Family

## 2014-11-28 MED ORDER — INTERFERON BETA-1A 30 MCG/0.5ML IM AJKT: 30.0000 ug | AUTO-INJECTOR | INTRAMUSCULAR | Status: DC

## 2014-11-28 NOTE — Telephone Encounter (Signed)
Pt needs avonex pens send to [facility]

## 2014-11-28 NOTE — Telephone Encounter (Signed)
Refill was placed

## 2014-12-17 ENCOUNTER — Ambulatory Visit: Payer: Medicare Other | Admitting: Family Medicine

## 2014-12-18 ENCOUNTER — Ambulatory Visit: Payer: Medicare Other | Admitting: Family Medicine

## 2014-12-19 ENCOUNTER — Telehealth: Payer: Self-pay | Admitting: Family

## 2014-12-19 NOTE — Telephone Encounter (Signed)
Pt can not keep 12-28-14 appt . Can I reschedule?

## 2014-12-21 NOTE — Telephone Encounter (Signed)
lmom for pt to call back

## 2014-12-21 NOTE — Telephone Encounter (Signed)
Please let him know that I am quite full and have stopped recruiting new patients and see if we could get him in with [facility], our geriatric provider, so that he could be seen quicker. If for some reason only wants to see me, ok to schedule in available 30 minute slot.

## 2014-12-21 NOTE — Telephone Encounter (Signed)
Correction appt on 12-27-14

## 2014-12-24 NOTE — Telephone Encounter (Signed)
Pt has been rsc

## 2014-12-27 ENCOUNTER — Ambulatory Visit: Payer: Medicare Other | Admitting: Family Medicine

## 2015-01-01 ENCOUNTER — Other Ambulatory Visit: Payer: Self-pay | Admitting: *Deleted

## 2015-01-01 NOTE — Telephone Encounter (Signed)
Patient is requesting a refill of traMADol (ULTRAM) 50 MG tablet [facility]

## 2015-01-02 ENCOUNTER — Telehealth: Payer: Self-pay | Admitting: *Deleted

## 2015-01-02 MED ORDER — TRAMADOL HCL 50 MG PO TABS: 50.0000 mg | ORAL_TABLET | Freq: Four times a day (QID) | ORAL | Status: DC | PRN

## 2015-01-02 NOTE — Telephone Encounter (Signed)
Ok to refill 

## 2015-01-02 NOTE — Telephone Encounter (Signed)
Patient is requesting a refill of traMADol (ULTRAM) 50 MG tablet [facility]

## 2015-01-14 ENCOUNTER — Other Ambulatory Visit: Payer: Self-pay | Admitting: Family

## 2015-02-18 ENCOUNTER — Other Ambulatory Visit: Payer: Self-pay | Admitting: Family

## 2015-02-22 ENCOUNTER — Other Ambulatory Visit: Payer: Self-pay | Admitting: *Deleted

## 2015-02-22 MED ORDER — OLMESARTAN-AMLODIPINE-HCTZ 40-10-12.5 MG PO TABS: ORAL_TABLET | ORAL | Status: DC

## 2015-02-22 MED ORDER — METFORMIN HCL 1000 MG PO TABS: ORAL_TABLET | ORAL | Status: DC

## 2015-02-22 MED ORDER — AMITRIPTYLINE HCL 25 MG PO TABS: ORAL_TABLET | ORAL | Status: DC

## 2015-02-22 MED ORDER — CLONIDINE HCL 0.1 MG PO TABS: ORAL_TABLET | ORAL | Status: DC

## 2015-02-22 MED ORDER — SOLIFENACIN SUCCINATE 10 MG PO TABS: ORAL_TABLET | ORAL | Status: DC

## 2015-02-22 MED ORDER — ACCU-CHEK MULTICLIX LANCETS MISC: Status: DC

## 2015-02-22 MED ORDER — PIOGLITAZONE HCL 30 MG PO TABS: ORAL_TABLET | ORAL | Status: DC

## 2015-02-22 MED ORDER — GABAPENTIN 800 MG PO TABS: ORAL_TABLET | ORAL | Status: DC

## 2015-03-06 ENCOUNTER — Ambulatory Visit: Payer: Medicare Other | Admitting: Family Medicine

## 2015-03-11 ENCOUNTER — Other Ambulatory Visit: Payer: Self-pay | Admitting: Family

## 2015-03-28 ENCOUNTER — Ambulatory Visit: Payer: Medicare Other | Admitting: Family Medicine

## 2015-04-12 ENCOUNTER — Other Ambulatory Visit: Payer: Self-pay | Admitting: Family

## 2015-04-23 ENCOUNTER — Other Ambulatory Visit: Payer: Self-pay | Admitting: Family

## 2015-05-13 ENCOUNTER — Other Ambulatory Visit: Payer: Self-pay | Admitting: Family

## 2015-05-16 ENCOUNTER — Other Ambulatory Visit: Payer: Self-pay | Admitting: Family Medicine

## 2015-05-17 ENCOUNTER — Encounter: Payer: Self-pay | Admitting: Family Medicine

## 2015-05-17 ENCOUNTER — Ambulatory Visit (INDEPENDENT_AMBULATORY_CARE_PROVIDER_SITE_OTHER): Payer: Medicare Other | Admitting: Family Medicine

## 2015-05-17 VITALS — BP 140/80 | HR 82 | Temp 97.7°F | Ht 67.0 in | Wt 170.7 lb

## 2015-05-17 DIAGNOSIS — E1165 Type 2 diabetes mellitus with hyperglycemia: Secondary | ICD-10-CM

## 2015-05-17 DIAGNOSIS — E118 Type 2 diabetes mellitus with unspecified complications: Secondary | ICD-10-CM

## 2015-05-17 DIAGNOSIS — G35 Multiple sclerosis: Secondary | ICD-10-CM

## 2015-05-17 DIAGNOSIS — Z7189 Other specified counseling: Secondary | ICD-10-CM

## 2015-05-17 DIAGNOSIS — Z23 Encounter for immunization: Secondary | ICD-10-CM

## 2015-05-17 DIAGNOSIS — D72819 Decreased white blood cell count, unspecified: Secondary | ICD-10-CM | POA: Diagnosis not present

## 2015-05-17 DIAGNOSIS — I10 Essential (primary) hypertension: Secondary | ICD-10-CM

## 2015-05-17 DIAGNOSIS — IMO0002 Reserved for concepts with insufficient information to code with codable children: Secondary | ICD-10-CM

## 2015-05-17 DIAGNOSIS — Z7689 Persons encountering health services in other specified circumstances: Secondary | ICD-10-CM

## 2015-05-17 LAB — BASIC METABOLIC PANEL
BUN: 18 mg/dL (ref 6–23)
CHLORIDE: 102 meq/L (ref 96–112)
CO2: 30 meq/L (ref 19–32)
Calcium: 10.1 mg/dL (ref 8.4–10.5)
Creatinine, Ser: 1.07 mg/dL (ref 0.40–1.50)
GFR: 73.32 mL/min (ref >60.00)
Glucose, Bld: 103 mg/dL — ABNORMAL HIGH (ref 70–99)
Potassium: 4.3 mEq/L (ref 3.5–5.1)
Sodium: 140 mEq/L (ref 135–145)

## 2015-05-17 LAB — CBC WITH DIFFERENTIAL/PLATELET
Basophils Absolute: 0 10*3/uL (ref 0.0–0.1)
Basophils Relative: 0.4 % (ref 0.0–3.0)
EOS PCT: 1.2 % (ref 0.0–5.0)
Eosinophils Absolute: 0 10*3/uL (ref 0.0–0.7)
HEMATOCRIT: 35 % — AB (ref 39.0–52.0)
Hemoglobin: 11.4 g/dL — ABNORMAL LOW (ref 13.0–17.0)
LYMPHS ABS: 0.7 10*3/uL (ref 0.7–4.0)
LYMPHS PCT: 25.9 % (ref 12.0–46.0)
MCHC: 32.4 g/dL (ref 30.0–36.0)
MCV: 95.6 fl (ref 78.0–100.0)
MONOS PCT: 7.1 % (ref 3.0–12.0)
Monocytes Absolute: 0.2 10*3/uL (ref 0.1–1.0)
NEUTROS ABS: 1.9 10*3/uL (ref 1.4–7.7)
NEUTROS PCT: 65.4 % (ref 43.0–77.0)
PLATELETS: 295 10*3/uL (ref 150.0–400.0)
RBC: 3.67 Mil/uL — ABNORMAL LOW (ref 4.22–5.81)
RDW: 15.4 % (ref 11.5–15.5)
WBC: 2.9 10*3/uL — ABNORMAL LOW (ref 4.0–10.5)

## 2015-05-17 LAB — CHOLESTEROL, TOTAL: Cholesterol: 160 mg/dL (ref 0–200)

## 2015-05-17 LAB — HEMOGLOBIN A1C: HEMOGLOBIN A1C: 6.4 % (ref 4.6–6.5)

## 2015-05-17 LAB — HDL CHOLESTEROL: HDL: 44.1 mg/dL (ref >39.00)

## 2015-05-17 NOTE — Progress Notes (Signed)
HPI:  Larry [name] is here to establish care. His prior PCP was [facility]. Appears has not been seen here in over 1 year.  Has the following chronic problems that require follow up and concerns today:  DM: -meds: asa, metformin 1000mg  bid, arb, actos 30mg  -reports checks fasting BS daily and is in 80-90s -exercises 3 days per week, poor diet -denies vision changes, wounds on feet, hypoglycemia  HTN: -meds: olmesartan-amlodipine-hctz, clonidine -no CP, SOB, DOE -chronic bilat LE edema chronically L >R  MS/reports hx spinal cord injury with residual bilat lower ext weaknes: -Managed by [facility] -meds: avonex, gabapentin, baclofen, elavil -reports stable  Leukopenia/Anemia: -eval with onc in 2016, felt genetic and benign per notes  ROS negative for unless reported above: fevers, unintentional weight loss, hearing or vision loss, chest pain, palpitations, struggling to breath, hemoptysis, melena, hematochezia, hematuria, falls, loc, si, thoughts of self harm  Past Medical History  Diagnosis Date  . MS (multiple sclerosis) [facility])     sees [facility]  . Diabetes mellitus   . Hypertension   . Dementia   . Abnormality of gait   . Acute delirium 12/11/2011  . Hypercholesterolemia   . 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 Leukocytopenia 05/22/2013    Past Surgical History  Procedure Laterality Date  . Lumbar laminectomy    . Back surgery      No family history on file.  Social History   Social History  . Marital Status: Legally Separated    Spouse Name: N/A  . Number of Children: N/A  . Years of Education: N/A   Occupational History  . Disability    Social History Main Topics  . Smoking status: Never Smoker   . Smokeless tobacco: None  . Alcohol Use: No  . Drug Use: No  . Sexual Activity: Not Currently   Other Topics Concern  . None   Social History Narrative      Current outpatient prescriptions:  .  ACCU-CHEK AVIVA PLUS test strip, Test once daily, Disp: 100 each, Rfl: 3 .  amitriptyline (ELAVIL) 25 MG tablet, Take 1 tablet by mouth at  bedtime, Disp: 90 tablet, Rfl: 0 .  aspirin 81 MG tablet, Take 81 mg by mouth daily., Disp: , Rfl:  .  baclofen (LIORESAL) 10 MG tablet, TAKE TWO TABLETS BY MOUTH THREE TIMES DAILY, Disp: 168 tablet, Rfl: 0 .  Blood Glucose Calibration (ACCU-CHEK AVIVA) SOLN, , Disp: , Rfl:  .  cholecalciferol (VITAMIN D) 1000 UNITS tablet, Take 1,000 Units by mouth daily., Disp: , Rfl:  .  cloNIDine (CATAPRES) 0.1 MG tablet, Take 1 tablet by mouth  twice a day, Disp: 180 tablet, Rfl: 0 .  Cyanocobalamin (VITAMIN B-12 PO), Take by mouth. Patient is unsure of dose, Disp: , Rfl:  .  gabapentin (NEURONTIN) 800 MG tablet, Take 1 tablet by mouth  twice daily and 2 tablets  at bedtime, Disp: 360 tablet, Rfl: 0 .  Interferon Beta-1a (AVONEX PEN) 30 MCG/0.5ML AJKT, Inject 30 mcg into the muscle once a week., Disp: 3 each, Rfl: 0 .  Lancets (ACCU-CHEK MULTICLIX) lancets, Use to check blood glucose  once daily, Disp: 102 each, Rfl: 3 .  metFORMIN (GLUCOPHAGE) 1000 MG tablet, Take 1 tablet by mouth two  times daily with meals, Disp: 180 tablet, Rfl: 0 .  Multiple Vitamin (MULTIVITAMIN) tablet, Take 1 tablet by mouth daily., Disp: , Rfl:  .  Olmesartan-Amlodipine-HCTZ (TRIBENZOR PO), Take by mouth daily., Disp: , Rfl:  .  pioglitazone (ACTOS) 30 MG tablet, TAKE 1 TABLET BY MOUTH  EVERY DAY, Disp: 90 tablet, Rfl: 0 .  traMADol (ULTRAM) 50 MG tablet, Take 1 tablet (50 mg total) by mouth every 6 (six) hours as needed. for pain, Disp: 120 tablet, Rfl: 1 .  TRIBENZOR 40-10-12.5 MG TABS, Take 1 tablet by mouth  daily, Disp: 90 tablet, Rfl: 0 .  VESICARE 10 MG tablet, Take 1 tablet by mouth  daily, Disp: 90 tablet, Rfl: 0  EXAM:  Filed Vitals:   05/17/15 1136  BP: 140/80  Pulse: 82  Temp: 97.7 F (36.5 C)    Body mass index is 26.73  kg/(m^2).  GENERAL: vitals reviewed and listed above, alert, oriented, appears well hydrated and in no acute distress  HEENT: atraumatic, conjunttiva clear, no obvious abnormalities on inspection of external nose and ears  NECK: no obvious masses on inspection  LUNGS: clear to auscultation bilaterally, no wheezes, rales or rhonchi, good air movement  CV: HRRR, bilat ankle and lower shin edema L > R  MS: moves all extremities without noticeable abnormality  PSYCH: pleasant and cooperative, no obvious depression or anxiety  ASSESSMENT AND PLAN:  Discussed the following assessment and plan:  Uncontrolled type 2 diabetes mellitus with complication, without long-term current use of insulin ([place]) - Plan: Hemoglobin A1c, Cholesterol, Total, HDL cholesterol  Essential hypertension - Plan: Basic metabolic panel  MS (multiple sclerosis) ([place])  Encounter to establish care  Leukocytopenia - Plan: CBC with Differential  -We reviewed the PMH, PSH, FH, SH, Meds and Allergies. -We provided refills for any medications we will prescribe as needed. -We addressed current concerns per orders and patient instructions. -We have asked for records for pertinent exams, studies, vaccines and notes from previous providers. -We have advised patient to follow up per instructions below.   -Patient advised to return or notify a doctor immediately if symptoms worsen or persist or new concerns arise.  Patient Instructions  BEFORE YOU LEAVE: -labs -flu and pneumonia vaccine PPSV23 -follow up in 4 months  -Call today to schedule follow up with your neurologist  -Call today to schedule your annual diabetic eye exam  -Check your feet every day  -We have ordered labs or studies at this visit. It can take up to 1-2 weeks for results and processing. We will contact you with instructions IF your results are abnormal. Normal results will be released to your [facility]. If you have not heard from [place] or can not  find your results in [facility] in 2 weeks please contact our office.  We recommend the following healthy lifestyle measures: - eat a healthy whole foods diet consisting of regular small meals composed of vegetables, fruits, beans, nuts, seeds, healthy meats such as white chicken and fish and whole grains.  - avoid sweets, white starchy foods, fried foods, fast food, processed foods, sodas, red meet and other fattening foods.  - get a least 150-300 minutes of aerobic exercise per week.            Colin Benton R.

## 2015-05-17 NOTE — Patient Instructions (Addendum)
BEFORE YOU LEAVE: -labs -flu and pneumonia vaccine PPSV23 -follow up in 4 months  -Call today to schedule follow up with your neurologist  -Call today to schedule your annual diabetic eye exam  -Check your feet every day  -We have ordered labs or studies at this visit. It can take up to 1-2 weeks for results and processing. We will contact you with instructions IF your results are abnormal. Normal results will be released to your [facility]. If you have not heard from [place] or can not find your results in [facility] in 2 weeks please contact our office.  We recommend the following healthy lifestyle measures: - eat a healthy whole foods diet consisting of regular small meals composed of vegetables, fruits, beans, nuts, seeds, healthy meats such as white chicken and fish and whole grains.  - avoid sweets, white starchy foods, fried foods, fast food, processed foods, sodas, red meet and other fattening foods.  - get a least 150-300 minutes of aerobic exercise per week.

## 2015-05-17 NOTE — Progress Notes (Signed)
Pre visit review using our clinic review tool, if applicable. No additional management support is needed unless otherwise documented below in the visit note. 

## 2015-05-17 NOTE — Addendum Note (Signed)
Addended by: Westley Hummer B on: 05/17/2015 12:27 PM   Modules accepted: Orders

## 2015-05-20 NOTE — Telephone Encounter (Signed)
Dr. Maudie Mercury saw patient on 05/17/15 to establish care. Previous [facility] patient.   Requesting refill: Tramadol (Ultram) 50 mg #120 with 1 refill  Last seen: 05/17/15 (establish care visit) Last refill: 01/02/2015 #120 with 1 refill   Okay to refill?

## 2015-05-22 ENCOUNTER — Other Ambulatory Visit: Payer: Self-pay | Admitting: Family

## 2015-05-22 ENCOUNTER — Telehealth: Payer: Self-pay | Admitting: Family Medicine

## 2015-05-22 DIAGNOSIS — G35 Multiple sclerosis: Secondary | ICD-10-CM

## 2015-05-22 NOTE — Telephone Encounter (Signed)
Ok to refer.

## 2015-05-22 NOTE — Telephone Encounter (Signed)
I called the pt and informed him the referral was placed and someone will call him with appt info.

## 2015-05-22 NOTE — Telephone Encounter (Addendum)
Pt needs a referral to see dr Krista Blue at [facility] neurologist for his MS. Pt has not seen dr Krista Blue in 3 years. Pt has [facility] medicare.

## 2015-05-22 NOTE — Addendum Note (Signed)
Addended by: Agnes Lawrence on: 05/22/2015 01:46 PM   Modules accepted: Orders

## 2015-05-28 ENCOUNTER — Telehealth: Payer: Self-pay | Admitting: Family Medicine

## 2015-05-28 NOTE — Telephone Encounter (Signed)
Patient is requesting refill on amitriptyline (ELAVIL) 25 MG tablet cloNIDine (CATAPRES) 0.1 MG tablet gabapentin (NEURONTIN) 800 MG tablet metFORMIN (GLUCOPHAGE) 1000 MG table pioglitazone (ACTOS) 30 MG tablet traMADol (ULTRAM) 50 MG tablet VESICARE 10 MG tablet  Pharmacy: [facility]

## 2015-05-28 NOTE — Telephone Encounter (Signed)
Ok to refill clonidine, metformin, actos and vesicare #90, 1 RF. He should see his neurologist for the other medication refills If has appt scheduled with neurology and needs refill to neurology appt ok to refill (elavil, gabapentin and tramadol for 1 month only but let him know future refills should come from treating neurologist.) thanks.

## 2015-05-29 MED ORDER — PIOGLITAZONE HCL 30 MG PO TABS: ORAL_TABLET | ORAL | Status: DC

## 2015-05-29 MED ORDER — SOLIFENACIN SUCCINATE 10 MG PO TABS: ORAL_TABLET | ORAL | Status: DC

## 2015-05-29 MED ORDER — CLONIDINE HCL 0.1 MG PO TABS: 0.1000 mg | ORAL_TABLET | Freq: Two times a day (BID) | ORAL | Status: DC

## 2015-05-29 MED ORDER — TRAMADOL HCL 50 MG PO TABS: 50.0000 mg | ORAL_TABLET | Freq: Four times a day (QID) | ORAL | Status: DC | PRN

## 2015-05-29 MED ORDER — GABAPENTIN 800 MG PO TABS: ORAL_TABLET | ORAL | Status: DC

## 2015-05-29 MED ORDER — AMITRIPTYLINE HCL 25 MG PO TABS: ORAL_TABLET | ORAL | Status: DC

## 2015-05-29 MED ORDER — METFORMIN HCL 1000 MG PO TABS: ORAL_TABLET | ORAL | Status: DC

## 2015-05-29 NOTE — Telephone Encounter (Signed)
I called the pt and informed him of the message below and he stated he has an appt with the neurologist on 1/11.  He is aware the Rxs were sent to his mail order, others to [facility] and I called in the Rx for Tramadol to [facility].

## 2015-05-29 NOTE — Addendum Note (Signed)
Addended by: Agnes Lawrence on: 05/29/2015 10:02 AM   Modules accepted: Orders

## 2015-06-05 DIAGNOSIS — H25813 Combined forms of age-related cataract, bilateral: Secondary | ICD-10-CM | POA: Diagnosis not present

## 2015-06-05 DIAGNOSIS — G35 Multiple sclerosis: Secondary | ICD-10-CM | POA: Diagnosis not present

## 2015-06-05 DIAGNOSIS — H40003 Preglaucoma, unspecified, bilateral: Secondary | ICD-10-CM | POA: Diagnosis not present

## 2015-06-05 DIAGNOSIS — H527 Unspecified disorder of refraction: Secondary | ICD-10-CM | POA: Diagnosis not present

## 2015-06-07 DIAGNOSIS — H40003 Preglaucoma, unspecified, bilateral: Secondary | ICD-10-CM | POA: Diagnosis not present

## 2015-06-12 ENCOUNTER — Ambulatory Visit (INDEPENDENT_AMBULATORY_CARE_PROVIDER_SITE_OTHER): Payer: Medicare Other | Admitting: Neurology

## 2015-06-12 ENCOUNTER — Encounter: Payer: Self-pay | Admitting: Neurology

## 2015-06-12 VITALS — BP 126/77 | HR 77 | Ht 67.0 in | Wt 171.5 lb

## 2015-06-12 DIAGNOSIS — S24103D Unspecified injury at T7-T10 level of thoracic spinal cord, subsequent encounter: Secondary | ICD-10-CM

## 2015-06-12 DIAGNOSIS — G8220 Paraplegia, unspecified: Secondary | ICD-10-CM

## 2015-06-12 DIAGNOSIS — M4802 Spinal stenosis, cervical region: Secondary | ICD-10-CM

## 2015-06-12 DIAGNOSIS — G35 Multiple sclerosis: Secondary | ICD-10-CM | POA: Diagnosis not present

## 2015-06-12 NOTE — Progress Notes (Signed)
PATIENT: Larry [name] DOB: 03/22/1951  Chief Complaint  Patient presents with  . Multiple Sclerosis    He is here with his daugther, [name].  He is concerned about his worsening gait and stooped posture.  He has been on Avonex since his diagnosis in 1999.  He is still going to the gym three times weekly.  . Hip Pain    He has been having right hip pain.  He is currently taking Tramadol and diclofenac as needed.  Both medications have been helpful.     HISTORICAL  Kenaan Rueter is a 65 year old male, accompanied by her daughter [name], seen in refer by his primary care physician Dr. Rodena Goldmann for evaluation of worsening gait difficulty, multiple sclerosis  He was a patient of our office in the past, last clinical visit was July 2013, he had a history of diabetes since 2002, also with  diagnosis of relapsing remitting multiple sclerosis, initial presentation was gait difficulty in 1996, he fell few times, he was initially diagnosed with lumbar spinal stenosis, had lumbar spine disc decompression surgery, which fail to improve his symptoms, he continued to have worsening gait difficulty, this eventually led to hospital admission at [facility], the diagnosis of MS was based on abnormal MRI findings, spinal fluid testing, after rehabilitation, he was able to ambulate again with a cane, there was no visual loss, he was started treatment with Avonex since 1996, he moved to [place] in 1999, was under the care of by Dr. Delphia Grates at [facility], was treated with Betaseron for a short period of time, but he could not tolerate it due to injection side effect, went back on Avelox shortly afterwards  over the years, he denies significant relapsing episode, he had slow decline in his walking, has been ambulate with crutches, exercise regularly  He had a history of lumbar decompression surgery  in 1996 at [place] with very difficult recovery.  He  presented to the hospital in July 2013 for worsening gait difficulty, had extensive imaging study, I have personally reviewed MRI of the brain in 2013 showed chronic changes, cervical spine with significant cord compression at C4-5 C5-6, but no abnormal cord signal to suggest cervical MS lesions, MRI of thoracic spine showed chronic cord compression and myelomalacia at T10-11, he was evaluated by neurosurgeon, Dr. Ellene Route, the plan was to treat him with short course of steroid, followed by thoracic elective decompression, he has made significant improvement not steroid treatment, has lost follow-up,  Over the past 3 years, he had worsening of his gait difficulty, he is able to walk with crutches, but with worsening bending forward posture and difficulty, recent few months, he also noticed bilateral hands paresthesia. he lives in his own apartment, has 12 steps he denies significant low back pain, no bowel and bladder incontinence, he exercise 3 times each week,  REVIEW OF SYSTEMS: Full 14 system review of systems performed and notable only for as above  ALLERGIES: No Known Allergies  HOME MEDICATIONS: Current Outpatient Prescriptions  Medication Sig Dispense Refill  . ACCU-CHEK AVIVA PLUS test strip Test once daily 100 each 3  . amitriptyline (ELAVIL) 25 MG tablet Take 1 tablet by mouth at  bedtime 30 tablet 0  . aspirin 81 MG tablet Take 81 mg by mouth daily.    . baclofen (LIORESAL) 10 MG tablet TAKE TWO TABLETS BY MOUTH THREE TIMES DAILY 168 tablet 0  . Blood Glucose Calibration (ACCU-CHEK AVIVA) SOLN     .  cholecalciferol (VITAMIN D) 1000 UNITS tablet Take 1,000 Units by mouth daily.    . cloNIDine (CATAPRES) 0.1 MG tablet Take 1 tablet (0.1 mg total) by mouth 2 (two) times daily. 180 tablet 1  . Cyanocobalamin (VITAMIN B-12 PO) Take by mouth. Patient is unsure of dose    . diclofenac (VOLTAREN) 75 MG EC tablet Take 75 mg by mouth as needed.    . gabapentin (NEURONTIN) 800 MG tablet Take 1  tablet by mouth  twice daily and 2 tablets  at bedtime 120 tablet 0  . Interferon Beta-1a (AVONEX PEN) 30 MCG/0.5ML AJKT Inject 30 mcg into the muscle once a week. 3 each 0  . Lancets (ACCU-CHEK MULTICLIX) lancets Use to check blood glucose  once daily 102 each 3  . metFORMIN (GLUCOPHAGE) 1000 MG tablet Take 1 tablet by mouth two  times daily with meals 180 tablet 1  . Multiple Vitamin (MULTIVITAMIN) tablet Take 1 tablet by mouth daily.    . Olmesartan-Amlodipine-HCTZ (TRIBENZOR PO) Take by mouth daily.    . pioglitazone (ACTOS) 30 MG tablet TAKE 1 TABLET BY MOUTH  EVERY DAY 90 tablet 1  . solifenacin (VESICARE) 10 MG tablet Take 1 tablet by mouth  daily 90 tablet 1  . traMADol (ULTRAM) 50 MG tablet Take 1 tablet (50 mg total) by mouth every 6 (six) hours as needed. for pain 30 tablet 0  . TRIBENZOR 40-10-12.5 MG TABS Take 1 tablet by mouth  daily 90 tablet 0   No current facility-administered medications for this visit.    PAST MEDICAL HISTORY: Past Medical History  Diagnosis Date  . MS (multiple sclerosis) [facility])     sees [facility]  . Diabetes mellitus   . Hypertension   . Dementia   . Abnormality of gait   . Acute delirium 12/11/2011  . Hypercholesterolemia   . T10 spinal cord injury ([place]) 12/12/2011    hx surgery 1996 with bilat gait abnormality since  . Leukocytopenia 05/22/2013    PAST SURGICAL HISTORY: Past Surgical History  Procedure Laterality Date  . Lumbar laminectomy    . Back surgery      FAMILY HISTORY: Family History  Problem Relation Age of Onset  . Liver cancer Father   . Hypertension Mother   . Hypertension Sister     SOCIAL HISTORY:  Social History   Social History  . Marital Status: Legally Separated    Spouse Name: N/A  . Number of Children: 1  . Years of Education: 13   Occupational History  . Disability    Social History Main Topics  . Smoking status: Never Smoker   . Smokeless tobacco: Not on file  . Alcohol Use: No  . Drug  Use: No  . Sexual Activity: Not Currently   Other Topics Concern  . Not on file   Social History Narrative   Work or [place] alone, feels safe      Spiritual Beliefs: Christian      Lifestyle: works out with friend 3 days per week at [employer]; diet is poor - likes to [employer] but like unhealthy options      Right-handed.    2 cups caffeine per day.        PHYSICAL EXAM   Filed Vitals:   06/12/15 1005  BP: 126/77  Pulse: 77  Height: 5\' 7"  (1.702 m)  Weight: 171 lb 8 oz (77.792 kg)    Not recorded  Body mass index is 26.85 kg/(m^2).  PHYSICAL EXAMNIATION:  Gen: NAD, conversant, well nourised, obese, well groomed                     Cardiovascular: Regular rate rhythm, no peripheral edema, warm, nontender. Eyes: Conjunctivae clear without exudates or hemorrhage Neck: Supple, no carotid bruise. Pulmonary: Clear to auscultation bilaterally   NEUROLOGICAL EXAM:  MENTAL STATUS: Speech:    Speech is normal; fluent and spontaneous with normal comprehension.  Cognition:     Orientation to time, place and person     Normal recent and remote memory     Normal Attention span and concentration     Normal Language, naming, repeating,spontaneous speech     Fund of knowledge   CRANIAL NERVES: CN II: Visual fields are full to confrontation. Fundoscopic exam is normal with sharp discs and no vascular changes. Pupils are round equal and briskly reactive to light. CN III, IV, VI: extraocular movement are normal. No ptosis. CN V: Facial sensation is intact to pinprick in all 3 divisions bilaterally. Corneal responses are intact.  CN VII: Face is symmetric with normal eye closure and smile. CN VIII: Hearing is normal to rubbing fingers CN IX, X: Palate elevates symmetrically. Phonation is normal. CN XI: Head turning and shoulder shrug are intact CN XII: Tongue is midline with normal movements and no atrophy.  MOTOR: He has mild bilateral abductor  pollicis abductor atrophy, mild weakness of bilateral grip, bilateral hip flexion 1/1, bilateral knee flexion 1/1, bilateral knee extension 3 plus/4, bilateral ankle dorsiflexion 4/4, bilateral ankle plantarflexion 4/4,   REFLEXES: Reflexes are  3 and symmetric at the biceps, triceps, knees, and ankles. Plantar responses are flexor.  SENSORY:  length dependent decreased to light touch , pinprick and vibratory sensation at knee level   COORDINATION: Rapid alternating movements and fine finger movements are intact. There is no dysmetria on finger-to-nose and heel-knee-shin.    GAIT/STANCE: Rely on his crutches, dragging both legs   DIAGNOSTIC DATA (LABS, IMAGING, TESTING) - I reviewed patient records, labs, notes, testing and imaging myself where available.   ASSESSMENT AND PLAN  Verlyn Saurman is a 65 y.o. male    Relapsing remitting multiple sclerosis  Continue Avonex Thoracic myelopathy Evidence of cervical myelopathy  Gait difficulty   Most likely due to his rapid myelopathy, and progressive worsening cervical myelopathy, radiculopathy  Refer him to  neurosurgeon Dr. Ellene Route for evaluation   Marcial Pacas, M.D. Ph.D.  [facility] [address] [place], [place] [address] Ph: [phone] Fax: [phone]  CC: Lucretia Kern, DO

## 2015-06-26 ENCOUNTER — Other Ambulatory Visit: Payer: Self-pay | Admitting: Family Medicine

## 2015-07-03 ENCOUNTER — Ambulatory Visit
Admission: RE | Admit: 2015-07-03 | Discharge: 2015-07-03 | Disposition: A | Discharge status: Home / Self Care | Payer: Medicare Other | Source: Ambulatory Visit | Attending: Neurology | Admitting: Neurology

## 2015-07-03 ENCOUNTER — Other Ambulatory Visit: Payer: Self-pay | Admitting: Family Medicine

## 2015-07-03 DIAGNOSIS — M4802 Spinal stenosis, cervical region: Secondary | ICD-10-CM

## 2015-07-03 DIAGNOSIS — G8220 Paraplegia, unspecified: Secondary | ICD-10-CM | POA: Diagnosis not present

## 2015-07-03 DIAGNOSIS — S24103D Unspecified injury at T7-T10 level of thoracic spinal cord, subsequent encounter: Secondary | ICD-10-CM

## 2015-07-04 ENCOUNTER — Other Ambulatory Visit: Payer: Self-pay | Admitting: Family Medicine

## 2015-07-07 ENCOUNTER — Other Ambulatory Visit: Payer: Self-pay | Admitting: Family

## 2015-07-08 ENCOUNTER — Telehealth: Payer: Self-pay | Admitting: Neurology

## 2015-07-08 ENCOUNTER — Telehealth: Payer: Self-pay | Admitting: *Deleted

## 2015-07-08 ENCOUNTER — Telehealth: Payer: Self-pay | Admitting: Family Medicine

## 2015-07-08 MED ORDER — BACLOFEN 10 MG PO TABS: 20.0000 mg | ORAL_TABLET | Freq: Three times a day (TID) | ORAL | Status: DC

## 2015-07-08 MED ORDER — TRAMADOL HCL 50 MG PO TABS: 50.0000 mg | ORAL_TABLET | Freq: Every evening | ORAL | Status: DC | PRN

## 2015-07-08 MED ORDER — AMITRIPTYLINE HCL 25 MG PO TABS: ORAL_TABLET | ORAL | Status: DC

## 2015-07-08 NOTE — Telephone Encounter (Signed)
He is aware of results and has a pending appt with Dr. Ellene Route.

## 2015-07-08 NOTE — Telephone Encounter (Signed)
Pt needs a refill on his baclofen, amitriptyline and tramadol sent to [facility] on [address].

## 2015-07-08 NOTE — Telephone Encounter (Signed)
Pt called and states his pcp will no long fill baclofen (LIORESAL) 10 MG tablet , amitriptyline (ELAVIL) 25 MG tablet or traMADol (ULTRAM) 50 MG tablet he is wondering if Dr. Krista Blue will. Please call and advise [phone]. Pt is out of medication.

## 2015-07-08 NOTE — Telephone Encounter (Signed)
I called the pt and informed him per Dr Maudie Mercury he needs to contact his neurologist for refills on these medications as they have been denied a few times with this reason indicated.  He asked why he did not have this problem with [place] and I advised him this is what Dr Maudie Mercury stated for him.

## 2015-07-08 NOTE — Telephone Encounter (Signed)
Please call patient, MRI of cervical, and thoracic spine showed multilevel degenerative disc disease, mild cervical spinal canal stenosis at different levels, most significant abnormalities T10-11 moderate to severe thoracic spinal canal stenosis,  I will refer him to a neurosurgeon for potential decompression surgery. If he has any questions, give him a follow-up appointment, I can review films with him,

## 2015-07-08 NOTE — Telephone Encounter (Signed)
He is on a number of medications with potential interactions. I do not routinely rx pain medications or muscle relaxers for regular use and feel that given his medical issues, for his safety and health, that it is important that he follow up with his specialists and that they refill these prescriptions. Thank you.

## 2015-07-08 NOTE — Telephone Encounter (Signed)
Spoke with pt. On review of neurology notes, he has been referred to a neurosurgeon. Discussed that I do not rx for chronic pain and risks/interactions with his medications. He reports understanding, reports has tolerated all meds for many years, uses tramadol sparingly now. Refilled for 1 month with one refill. Advised keep updated list of all medications and take to all appts. Advised he request his neurosurgeon/neurologist to manage pain medications if they feel comfortable moving forward. If  requires long term pain management and his specialists treating his pain condition/s are unwilling to manage his medications, then I would have him see a pain specialist. He voiced agreement.

## 2015-07-08 NOTE — Telephone Encounter (Signed)
Rx for Tramadol faxed and confirmed to pharmacy at [phone].

## 2015-07-08 NOTE — Telephone Encounter (Signed)
Spoke to patient - his PCP told him to ask you to take over these prescriptions.  Would you like to provide him with refills?

## 2015-07-08 NOTE — Telephone Encounter (Signed)
Pt states his neuro md will not and does not usually fill these below. Is requesting Dr Maudie Mercury to refill if possible.

## 2015-08-05 ENCOUNTER — Other Ambulatory Visit: Payer: Self-pay | Admitting: Family Medicine

## 2015-08-05 ENCOUNTER — Telehealth: Payer: Self-pay | Admitting: *Deleted

## 2015-08-05 MED ORDER — OLMESARTAN-AMLODIPINE-HCTZ 40-10-12.5 MG PO TABS: ORAL_TABLET | ORAL | Status: DC

## 2015-08-05 NOTE — Telephone Encounter (Signed)
[facility] Refill request  Olmesartan-Amlodipine-HCTZ (TRIBENZOR PO)

## 2015-08-07 ENCOUNTER — Other Ambulatory Visit: Payer: Self-pay | Admitting: Neurological Surgery

## 2015-08-07 DIAGNOSIS — M4716 Other spondylosis with myelopathy, lumbar region: Secondary | ICD-10-CM | POA: Diagnosis not present

## 2015-08-07 DIAGNOSIS — M542 Cervicalgia: Secondary | ICD-10-CM | POA: Diagnosis not present

## 2015-08-26 ENCOUNTER — Ambulatory Visit
Admission: RE | Admit: 2015-08-26 | Discharge: 2015-08-26 | Disposition: A | Discharge status: Home / Self Care | Payer: Medicare Other | Source: Ambulatory Visit | Attending: Neurological Surgery | Admitting: Neurological Surgery

## 2015-08-26 DIAGNOSIS — M4716 Other spondylosis with myelopathy, lumbar region: Secondary | ICD-10-CM

## 2015-08-26 DIAGNOSIS — M4806 Spinal stenosis, lumbar region: Secondary | ICD-10-CM | POA: Diagnosis not present

## 2015-09-05 ENCOUNTER — Encounter: Payer: Self-pay | Admitting: Neurology

## 2015-09-05 ENCOUNTER — Ambulatory Visit (INDEPENDENT_AMBULATORY_CARE_PROVIDER_SITE_OTHER): Payer: Medicare Other | Admitting: Neurology

## 2015-09-05 VITALS — BP 136/86 | HR 94 | Ht 67.0 in | Wt 183.0 lb

## 2015-09-05 DIAGNOSIS — M4802 Spinal stenosis, cervical region: Secondary | ICD-10-CM | POA: Diagnosis not present

## 2015-09-05 DIAGNOSIS — G8220 Paraplegia, unspecified: Secondary | ICD-10-CM | POA: Diagnosis not present

## 2015-09-05 DIAGNOSIS — R531 Weakness: Secondary | ICD-10-CM

## 2015-09-05 MED ORDER — TRAZODONE HCL 50 MG PO TABS: 50.0000 mg | ORAL_TABLET | Freq: Every day | ORAL | Status: DC

## 2015-09-05 MED ORDER — MELOXICAM 7.5 MG PO TABS: 7.5000 mg | ORAL_TABLET | Freq: Two times a day (BID) | ORAL | Status: DC

## 2015-09-05 NOTE — Progress Notes (Addendum)
Chief Complaint  Patient presents with  . Multiple Sclerosis    He is here with his daughter, [name]. He is concerned about his worsneing gait.  Also, reports an episode of right hip pain and right leg numbness that lasted several hours (this occurred one week ago).  He has recently had a repeat lumbar MRI and has another appointment with Dr. Ellene Route next month.  Says he has noticed a decrease appetite and he has to force himself to eat meals.      PATIENT: Larry [name] DOB: June 16, 1950  Chief Complaint  Patient presents with  . Multiple Sclerosis    He is here with his daughter, [name]. He is concerned about his worsneing gait.  Also, reports an episode of right hip pain and right leg numbness that lasted several hours (this occurred one week ago).  He has recently had a repeat lumbar MRI and has another appointment with Dr. Ellene Route next month.  Says he has noticed a decrease appetite and he has to force himself to eat meals.     HISTORICAL  Larry [name] is a 65 year old male, accompanied by her daughter [name], seen in refer by his primary care physician Dr. Rodena Goldmann for evaluation of worsening gait difficulty, multiple sclerosis  He was a patient of our office in the past, last clinical visit was July 2013, he had a history of diabetes since 2002, also with  diagnosis of relapsing remitting multiple sclerosis, initial presentation was gait difficulty in 1996, he fell few times, he was initially diagnosed with lumbar spinal stenosis, had lumbar spine disc decompression surgery, which fail to improve his symptoms, he continued to have worsening gait difficulty, this eventually led to hospital admission at [facility], the diagnosis of MS was based on abnormal MRI findings, spinal fluid testing, after rehabilitation, he was able to ambulate again with a cane, there was no visual loss, he was started treatment with Avonex since 1996, he moved to [place] in 1999,  was under the care of by Dr. Delphia Grates at [facility], was treated with Betaseron for a short period of time, but he could not tolerate it due to injection side effect, went back on Avelox shortly afterwards  over the years, he denies significant relapsing episode, he had slow decline in his walking, has been ambulate with crutches, exercise regularly  He had a history of lumbar decompression surgery  in 1996 at [place] with very difficult recovery, he was in wheelchair for 6 months because of gait difficulty.  He presented to the hospital in July 2013 for worsening gait difficulty, had extensive imaging study, I have personally reviewed MRI of the brain in 2013 showed chronic changes, cervical spine with significant cord compression at C4-5 C5-6, but no abnormal cord signal to suggest cervical MS lesions, MRI of thoracic spine showed chronic cord compression and myelomalacia at T10-11, he was evaluated by neurosurgeon, Dr. Ellene Route, the plan was to treat him with short course of steroid, followed by thoracic elective decompression, he has made significant improvement not steroid treatment, has lost follow-up,  Over the past 3 years, he had worsening of his gait difficulty, he is able to walk with crutches, but with worsening bending forward posture and difficulty, recent few months, he also noticed bilateral hands paresthesia. he lives in his own apartment, has 12 steps he denies significant low back pain, no bowel and bladder incontinence, he exercise 3 times each week,  UPDATE April 6th 2017: He was  evaluated by Dr. Ellene Route in early March 2017, per patient, he was offered decompression surgery for cervical and thoracic stenosis, he also had MRI of lumbar He is now complaining of worsening right low back pain, radiating pain to right hip and right thigh,   He also began to notice bilateral hands numbness weakness since 2003, gradually getting worse, is very difficult for him to  button his shirt  I have personally reviewed MRI of lumbar in March 2017: Multilevel degenerative changes, most severe at L4-5 multifactorial severe spinal stenosis lateral recess stenosis. Marked foraminal narrowing greater on the right   REVIEW OF SYSTEMS: Full 14 system review of systems performed and notable only for: Walking difficulty  ALLERGIES: No Known Allergies  HOME MEDICATIONS: Current Outpatient Prescriptions  Medication Sig Dispense Refill  . ACCU-CHEK AVIVA PLUS test strip Test once daily 100 each 3  . amitriptyline (ELAVIL) 25 MG tablet Take 1 tablet by mouth at  bedtime 30 tablet 11  . aspirin 81 MG tablet Take 81 mg by mouth daily.    . baclofen (LIORESAL) 10 MG tablet Take 2 tablets (20 mg total) by mouth 3 (three) times daily. 180 tablet 11  . Blood Glucose Calibration (ACCU-CHEK AVIVA) SOLN     . cholecalciferol (VITAMIN D) 1000 UNITS tablet Take 1,000 Units by mouth daily.    . cloNIDine (CATAPRES) 0.1 MG tablet Take 1 tablet (0.1 mg total) by mouth 2 (two) times daily. 180 tablet 1  . Cyanocobalamin (VITAMIN B-12 PO) Take by mouth. Patient is unsure of dose    . diclofenac (VOLTAREN) 75 MG EC tablet Take 75 mg by mouth as needed.    . gabapentin (NEURONTIN) 800 MG tablet Take 1 tablet by mouth  twice daily and 2 tablets  by mouth at bedtime 360 tablet 1  . Interferon Beta-1a (AVONEX PEN) 30 MCG/0.5ML AJKT Inject 30 mcg into the muscle once a week. 3 each 0  . Lancets (ACCU-CHEK MULTICLIX) lancets Use to check blood glucose  once daily 102 each 3  . metFORMIN (GLUCOPHAGE) 1000 MG tablet Take 1 tablet by mouth two  times daily with meals 180 tablet 1  . Multiple Vitamin (MULTIVITAMIN) tablet Take 1 tablet by mouth daily.    . Olmesartan-Amlodipine-HCTZ (TRIBENZOR) 40-10-12.5 MG TABS Take 1 tablet by mouth  daily 90 tablet 3  . pioglitazone (ACTOS) 30 MG tablet TAKE 1 TABLET BY MOUTH  EVERY DAY 90 tablet 1  . solifenacin (VESICARE) 10 MG tablet Take 1 tablet by mouth   daily 90 tablet 1  . traMADol (ULTRAM) 50 MG tablet Take 1 tablet (50 mg total) by mouth at bedtime as needed for severe pain. for pain 30 tablet 5   No current facility-administered medications for this visit.    PAST MEDICAL HISTORY: Past Medical History  Diagnosis Date  . MS (multiple sclerosis) [facility])     sees [facility]  . Diabetes mellitus   . Hypertension   . Dementia   . Abnormality of gait   . Acute delirium 12/11/2011  . Hypercholesterolemia   . T10 spinal cord injury ([place]) 12/12/2011    hx surgery 1996 with bilat gait abnormality since  . Leukocytopenia 05/22/2013    PAST SURGICAL HISTORY: Past Surgical History  Procedure Laterality Date  . Lumbar laminectomy    . Back surgery      FAMILY HISTORY: Family History  Problem Relation Age of Onset  . Liver cancer Father   . Hypertension Mother   .  Hypertension Sister     SOCIAL HISTORY:  Social History   Social History  . Marital Status: Legally Separated    Spouse Name: N/A  . Number of Children: 1  . Years of Education: 13   Occupational History  . Disability    Social History Main Topics  . Smoking status: Never Smoker   . Smokeless tobacco: Not on file  . Alcohol Use: No  . Drug Use: No  . Sexual Activity: Not Currently   Other Topics Concern  . Not on file   Social History Narrative   Work or [place] alone, feels safe      Spiritual Beliefs: Christian      Lifestyle: works out with friend 3 days per week at [employer]; diet is poor - likes to [employer] but like unhealthy options      Right-handed.    2 cups caffeine per day.        PHYSICAL EXAM   Filed Vitals:   09/05/15 1047  BP: 136/86  Pulse: 94  Height: 5\' 7"  (1.702 m)  Weight: 183 lb (83.008 kg)    Not recorded      Body mass index is 28.66 kg/(m^2).  PHYSICAL EXAMNIATION:  Gen: NAD, conversant, well nourised, obese, well groomed                     Cardiovascular: Regular rate  rhythm, no peripheral edema, warm, nontender. Eyes: Conjunctivae clear without exudates or hemorrhage Neck: Supple, no carotid bruise. Pulmonary: Clear to auscultation bilaterally   NEUROLOGICAL EXAM:  MENTAL STATUS: Speech:    Speech is normal; fluent and spontaneous with normal comprehension.  Cognition:     Orientation to time, place and person     Normal recent and remote memory     Normal Attention span and concentration     Normal Language, naming, repeating,spontaneous speech     Fund of knowledge   CRANIAL NERVES: CN II: Visual fields are full to confrontation. Fundoscopic exam is normal with sharp discs and no vascular changes. Pupils are round equal and briskly reactive to light. CN III, IV, VI: extraocular movement are normal. No ptosis. CN V: Facial sensation is intact to pinprick in all 3 divisions bilaterally. Corneal responses are intact.  CN VII: Face is symmetric with normal eye closure and smile. CN VIII: Hearing is normal to rubbing fingers CN IX, X: Palate elevates symmetrically. Phonation is normal. CN XI: Head turning and shoulder shrug are intact CN XII: Tongue is midline with normal movements and no atrophy.  MOTOR: He has moderate bilateral abductor pollicis abductor atrophy and weakness, mild weakness of bilateral grip, bilateral proximal upper extremity strength were normal, bilateral hip flexion 2/2, bilateral knee flexion 1/1, bilateral knee extension 4/4, bilateral ankle dorsiflexion 4/4, bilateral ankle plantarflexion 4/4,   REFLEXES: Reflexes are  3 and symmetric at the biceps, triceps, knees, and ankles. Plantar responses are flexor.  SENSORY:  length dependent decreased to light touch , pinprick and vibratory sensation at knee level   COORDINATION: Rapid alternating movements and fine finger movements are intact. There is no dysmetria on finger-to-nose and heel-knee-shin.    GAIT/STANCE: Rely on his crutches, dragging both legs   DIAGNOSTIC  DATA (LABS, IMAGING, TESTING) - I reviewed patient records, labs, notes, testing and imaging myself where available.   ASSESSMENT AND PLAN  Vere Pellett is a 65 y.o. male    Relapsing remitting multiple sclerosis  Continue Avonex Thoracic myelopathy Evidence of cervical myelopathy  Low back pain, radiating pain to right hip, right lower extremity, consistent with right lumbar radiculopathy  EMG nerve conduction study  Mobic as needed Chronic insomnia  Stop amitriptyline, add on trazodone 50 mg daily  Marcial Pacas, M.D. Ph.D.  [facility] [address] [place], [place] [address] Ph: [phone] Fax: [phone]  CC: Lucretia Kern, DO  Addendum: I reviewed neurosurgeon note dated Oct 02 2015 by Dr. Ellene Route, patient had severe stenosis at L5-S1, stenosis at T10-11, Dr. Ellene Route has suggested him to have lumbar decompression surgery, fusion of L5-S1, Mr. Kocis had like to hold off surgery until July,

## 2015-09-16 ENCOUNTER — Other Ambulatory Visit: Payer: Self-pay | Admitting: Family

## 2015-09-16 ENCOUNTER — Ambulatory Visit: Payer: Medicare Other | Admitting: Family Medicine

## 2015-09-24 ENCOUNTER — Ambulatory Visit (INDEPENDENT_AMBULATORY_CARE_PROVIDER_SITE_OTHER): Payer: Medicare Other | Admitting: Family Medicine

## 2015-09-24 ENCOUNTER — Encounter: Payer: Self-pay | Admitting: Family Medicine

## 2015-09-24 VITALS — BP 130/84 | HR 85 | Temp 97.6°F | Ht 67.0 in | Wt 168.5 lb

## 2015-09-24 DIAGNOSIS — R159 Full incontinence of feces: Secondary | ICD-10-CM | POA: Diagnosis not present

## 2015-09-24 DIAGNOSIS — R194 Change in bowel habit: Secondary | ICD-10-CM

## 2015-09-24 DIAGNOSIS — E119 Type 2 diabetes mellitus without complications: Secondary | ICD-10-CM | POA: Diagnosis not present

## 2015-09-24 DIAGNOSIS — I10 Essential (primary) hypertension: Secondary | ICD-10-CM | POA: Diagnosis not present

## 2015-09-24 DIAGNOSIS — S24103D Unspecified injury at T7-T10 level of thoracic spinal cord, subsequent encounter: Secondary | ICD-10-CM

## 2015-09-24 DIAGNOSIS — G35 Multiple sclerosis: Secondary | ICD-10-CM

## 2015-09-24 LAB — CBC
HCT: 34.3 % — ABNORMAL LOW (ref 39.0–52.0)
Hemoglobin: 11.3 g/dL — ABNORMAL LOW (ref 13.0–17.0)
MCHC: 32.8 g/dL (ref 30.0–36.0)
MCV: 94.8 fl (ref 78.0–100.0)
Platelets: 290 10*3/uL (ref 150.0–400.0)
RBC: 3.61 Mil/uL — AB (ref 4.22–5.81)
RDW: 14.9 % (ref 11.5–15.5)
WBC: 3.4 10*3/uL — AB (ref 4.0–10.5)

## 2015-09-24 LAB — LIPID PANEL
Cholesterol: 167 mg/dL (ref 0–200)
HDL: 48.5 mg/dL (ref >39.00)
LDL CALC: 101 mg/dL — AB (ref 0–99)
NONHDL: 118.34
Total CHOL/HDL Ratio: 3
Triglycerides: 85 mg/dL (ref 0.0–149.0)
VLDL: 17 mg/dL (ref 0.0–40.0)

## 2015-09-24 LAB — BASIC METABOLIC PANEL
BUN: 19 mg/dL (ref 6–23)
CALCIUM: 10.2 mg/dL (ref 8.4–10.5)
CO2: 30 mEq/L (ref 19–32)
Chloride: 102 mEq/L (ref 96–112)
Creatinine, Ser: 1.01 mg/dL (ref 0.40–1.50)
GFR: 78.28 mL/min (ref >60.00)
Glucose, Bld: 102 mg/dL — ABNORMAL HIGH (ref 70–99)
POTASSIUM: 4 meq/L (ref 3.5–5.1)
SODIUM: 139 meq/L (ref 135–145)

## 2015-09-24 LAB — HEMOGLOBIN A1C: HEMOGLOBIN A1C: 6.5 % (ref 4.6–6.5)

## 2015-09-24 NOTE — Progress Notes (Signed)
HPI: Larry [name] is a pleasant 64 yo, whom transferred to me recently with a complicated PMH significant for MS, spinal cord injury with gait abnormality, DM, HTN and hyperlipidemia here for follow up:  DM: -last hgba1c 6.4 05/2015 -meds: asa, metformin 1000mg  bid, arb, actos 30mg  -reports checks fasting BS daily and is in 80-90s -exercises 3 days per week, poor diet -denies vision changes, wounds on feet, hypoglycemia  HTN: -meds: olmesartan-amlodipine-hctz, clonidine -no CP, SOB, DOE -chronic bilat LE edema chronically L >R  MS/reports hx spinal cord injury with residual bilat lower ext weaknes: -relapsing, remitting MS, on [place] (did not tolerate), now avonex -Managed by [facility] (Dr. Krista Blue) and Neurosurgery (Dr. Ellene Route) - hx lumbar decompression '96 -MRI 2017 with DDD, severe spinal stenosis lumbar -reports seeing neuro next week, getting nerve conduction studies and considering surgery -meds: avonex, gabapentin, baclofen, trazadone -reports stable -told him at first visit with me that I do not manage chronic pain nor prescribe controlled medications - he is understanding, offered pain management if needed if symptoms not controlled by tx with his specialists  Leukopenia/Anemia: -eval with onc in 2016, felt genetic and benign per notes -improved chk 05/2015   ROS: See pertinent positives and negatives per HPI.  Past Medical History  Diagnosis Date  . MS (multiple sclerosis) [facility])     sees [facility]  . Diabetes mellitus   . Hypertension   . Dementia   . Abnormality of gait   . Acute delirium 12/11/2011  . Hypercholesterolemia   . T10 spinal cord injury ([place]) 12/12/2011    hx surgery 1996 with bilat gait abnormality since  . Leukocytopenia 05/22/2013    Past Surgical History  Procedure Laterality Date  . Lumbar laminectomy    . Back surgery      Family History  Problem Relation Age of Onset  . Liver cancer Father   . Hypertension Mother    . Hypertension Sister     Social History   Social History  . Marital Status: Legally Separated    Spouse Name: N/A  . Number of Children: 1  . Years of Education: 13   Occupational History  . Disability    Social History Main Topics  . Smoking status: Never Smoker   . Smokeless tobacco: None  . Alcohol Use: No  . Drug Use: No  . Sexual Activity: Not Currently   Other Topics Concern  . None   Social History Narrative   Work or [place] alone, feels safe      Spiritual Beliefs: Christian      Lifestyle: works out with friend 3 days per week at [employer]; diet is poor - likes to [employer] but like unhealthy options      Right-handed.    2 cups caffeine per day.        Current outpatient prescriptions:  .  ACCU-CHEK AVIVA PLUS test strip, Test once daily, Disp: 100 each, Rfl: 3 .  aspirin 81 MG tablet, Take 81 mg by mouth daily., Disp: , Rfl:  .  baclofen (LIORESAL) 10 MG tablet, Take 2 tablets (20 mg total) by mouth 3 (three) times daily., Disp: 180 tablet, Rfl: 11 .  Blood Glucose Calibration (ACCU-CHEK AVIVA) SOLN, , Disp: , Rfl:  .  cholecalciferol (VITAMIN D) 1000 UNITS tablet, Take 1,000 Units by mouth daily., Disp: , Rfl:  .  cloNIDine (CATAPRES) 0.1 MG tablet, Take 1 tablet (0.1 mg total) by mouth 2 (  two) times daily., Disp: 180 tablet, Rfl: 1 .  Cyanocobalamin (VITAMIN B-12 PO), Take by mouth. Patient is unsure of dose, Disp: , Rfl:  .  diclofenac (VOLTAREN) 75 MG EC tablet, Take 75 mg by mouth as needed., Disp: , Rfl:  .  gabapentin (NEURONTIN) 800 MG tablet, Take 1 tablet by mouth  twice daily and 2 tablets  by mouth at bedtime, Disp: 360 tablet, Rfl: 1 .  Interferon Beta-1a (AVONEX PEN) 30 MCG/0.5ML AJKT, Inject 30 mcg into the muscle once a week., Disp: 3 each, Rfl: 0 .  Lancets (ACCU-CHEK MULTICLIX) lancets, Use to check blood glucose  once daily, Disp: 102 each, Rfl: 3 .  meloxicam (MOBIC) 7.5 MG tablet, Take 1 tablet (7.5 mg total)  by mouth 2 (two) times daily., Disp: 60 tablet, Rfl: 6 .  metFORMIN (GLUCOPHAGE) 1000 MG tablet, Take 1 tablet by mouth two  times daily with meals, Disp: 180 tablet, Rfl: 1 .  Multiple Vitamin (MULTIVITAMIN) tablet, Take 1 tablet by mouth daily., Disp: , Rfl:  .  Olmesartan-Amlodipine-HCTZ (TRIBENZOR) 40-10-12.5 MG TABS, Take 1 tablet by mouth  daily, Disp: 90 tablet, Rfl: 3 .  pioglitazone (ACTOS) 30 MG tablet, TAKE 1 TABLET BY MOUTH  EVERY DAY, Disp: 90 tablet, Rfl: 1 .  solifenacin (VESICARE) 10 MG tablet, Take 1 tablet by mouth  daily, Disp: 90 tablet, Rfl: 1 .  traMADol (ULTRAM) 50 MG tablet, Take 1 tablet (50 mg total) by mouth at bedtime as needed for severe pain. for pain, Disp: 30 tablet, Rfl: 5 .  traZODone (DESYREL) 50 MG tablet, Take 1 tablet (50 mg total) by mouth at bedtime., Disp: 30 tablet, Rfl: 6  EXAM:  Filed Vitals:   09/24/15 0953  BP: 130/84  Pulse: 85  Temp: 97.6 F (36.4 C)    Body mass index is 26.38 kg/(m^2).  GENERAL: vitals reviewed and listed above, alert, oriented, appears well hydrated and in no acute distress  HEENT: atraumatic, conjunttiva clear, no obvious abnormalities on inspection of external nose and ears  NECK: no obvious masses on inspection  LUNGS: clear to auscultation bilaterally, no wheezes, rales or rhonchi, good air movement  CV: HRRR, no peripheral edema  MS: Uses crutches for ambulation  PSYCH: pleasant and cooperative, no obvious depression or anxiety  ASSESSMENT AND PLAN:  Discussed the following assessment and plan:  Change in bowel habits - Plan: Ambulatory referral to Gastroenterology, CBC (no diff)  Bowel incontinence - Plan: Ambulatory referral to Gastroenterology  Type 2 diabetes mellitus without complication, without long-term current use of insulin (HCC) - Plan: Lipid Panel, Hemoglobin A1c  Essential hypertension - Plan: Basic metabolic panel  MS (multiple sclerosis) (HCC)  T10 spinal cord injury, subsequent  encounter ([place])  -Eye exam done per pt, advised assistant to obtain report -lifestle recs -Labs per his request today -We discussed possible serious and likely etiologies for bowel incontinence, workup and treatment, treatment risks and return precautions - he desires to see a gastroenterologist for this, referral placed per his preference. -advised again that neuro and pain medications come from treating specialist for his safety and that I do no rx controlled medications for regular use; can get him in with pain clinic if needed - he reports he is doing okay right now with occasions from his specialist. -Patient advised to return or notify a doctor immediately if symptoms worsen or persist or new concerns arise.  Patient Instructions  Before you leave: -permission/obtain diabetic eye exam -Labs -Follow up in  6 months  -We placed a referral for you as discussed to the gastroenterologist regarding your bowel concerns. It usually takes about 1-2 weeks to process and schedule this referral. If you have not heard from [place] regarding this appointment in 2 weeks please contact our office.  -We have ordered labs or studies at this visit. It can take up to 1-2 weeks for results and processing. We will contact you with instructions IF your results are abnormal. Normal results will be released to your [facility]. If you have not heard from [place] or can not find your results in [facility] in 2 weeks please contact our office.  We recommend the following healthy lifestyle measures: - eat a healthy whole foods diet consisting of regular small meals composed of vegetables, fruits, beans, nuts, seeds, healthy meats such as white chicken and fish and whole grains.  - avoid sweets, white starchy foods, fried foods, fast food, processed foods, sodas, red meet and other fattening foods.  - get a least 150-300 minutes of aerobic exercise per week as you are able      Colin Benton R.

## 2015-09-24 NOTE — Patient Instructions (Addendum)
Before you leave: -permission/obtain diabetic eye exam -Labs -Follow up in 6 months  -We placed a referral for you as discussed to the gastroenterologist regarding your bowel concerns. It usually takes about 1-2 weeks to process and schedule this referral. If you have not heard from [place] regarding this appointment in 2 weeks please contact our office.  -We have ordered labs or studies at this visit. It can take up to 1-2 weeks for results and processing. We will contact you with instructions IF your results are abnormal. Normal results will be released to your [facility]. If you have not heard from [place] or can not find your results in [facility] in 2 weeks please contact our office.  We recommend the following healthy lifestyle measures: - eat a healthy whole foods diet consisting of regular small meals composed of vegetables, fruits, beans, nuts, seeds, healthy meats such as white chicken and fish and whole grains.  - avoid sweets, white starchy foods, fried foods, fast food, processed foods, sodas, red meet and other fattening foods.  - get a least 150-300 minutes of aerobic exercise per week as you are able

## 2015-09-24 NOTE — Progress Notes (Signed)
Pre visit review using our clinic review tool, if applicable. No additional management support is needed unless otherwise documented below in the visit note. 

## 2015-09-26 ENCOUNTER — Ambulatory Visit (INDEPENDENT_AMBULATORY_CARE_PROVIDER_SITE_OTHER): Payer: Medicare Other | Admitting: Neurology

## 2015-09-26 ENCOUNTER — Encounter: Payer: Self-pay | Admitting: Internal Medicine

## 2015-09-26 ENCOUNTER — Ambulatory Visit (INDEPENDENT_AMBULATORY_CARE_PROVIDER_SITE_OTHER): Payer: Self-pay | Admitting: Neurology

## 2015-09-26 ENCOUNTER — Encounter: Payer: Self-pay | Admitting: Neurology

## 2015-09-26 DIAGNOSIS — M4802 Spinal stenosis, cervical region: Secondary | ICD-10-CM

## 2015-09-26 DIAGNOSIS — G5601 Carpal tunnel syndrome, right upper limb: Secondary | ICD-10-CM | POA: Diagnosis not present

## 2015-09-26 DIAGNOSIS — Z0289 Encounter for other administrative examinations: Secondary | ICD-10-CM

## 2015-09-26 DIAGNOSIS — G5603 Carpal tunnel syndrome, bilateral upper limbs: Secondary | ICD-10-CM

## 2015-09-26 DIAGNOSIS — R531 Weakness: Secondary | ICD-10-CM

## 2015-09-26 DIAGNOSIS — G8220 Paraplegia, unspecified: Secondary | ICD-10-CM

## 2015-09-26 DIAGNOSIS — M48061 Spinal stenosis, lumbar region without neurogenic claudication: Secondary | ICD-10-CM | POA: Insufficient documentation

## 2015-09-26 DIAGNOSIS — G5600 Carpal tunnel syndrome, unspecified upper limb: Secondary | ICD-10-CM

## 2015-09-26 DIAGNOSIS — G5602 Carpal tunnel syndrome, left upper limb: Secondary | ICD-10-CM

## 2015-09-26 DIAGNOSIS — S24103D Unspecified injury at T7-T10 level of thoracic spinal cord, subsequent encounter: Secondary | ICD-10-CM

## 2015-09-26 HISTORY — DX: Carpal tunnel syndrome, unspecified upper limb: G56.00

## 2015-09-26 NOTE — Procedures (Signed)
   NCS (NERVE CONDUCTION STUDY) WITH EMG (ELECTROMYOGRAPHY) REPORT   STUDY DATE: September 26 2015 PATIENT NAME: Larry [name] DOB: 1950-09-07 MRN: [id number]    TECHNOLOGIST: Laretta Alstrom ELECTROMYOGRAPHER: Marcial Pacas M.D.  CLINICAL INFORMATION:  65 years old male, with progressive gait difficulty, with evidence of T10-11 thoracic myelopathy, also evidence of cervical, lumbar spondylitic disease.  FINDINGS: NERVE CONDUCTION STUDY: Bilateral peroneal, sural sensory responses were absent. Bilateral peroneal to EDB motor responses showed severely decreased C map amplitude, with preserved distal latency, conduction velocity. Bilateral tibial motor responses were absent. Bilateral tibial H reflexes were absent.  Bilateral median sensory responses were absent. Right median motor responses were absent. Left median motor responses showed severely prolonged distal latency, absent F-wave latency, severely decreased C map amplitude.   Bilateral ulnar sensory and motor responses were normal.    NEEDLE ELECTROMYOGRAPHY: Selected needle examinations was performed at bilateral lower extremity muscles, right upper extremity muscles, bilateral cervical paraspinal muscles and right cervical paraspinal muscles.  Left tibialis anterior, tibialis posterior, peroneal longus, medial gastrocnemius: Increased insertional activity, 1-2 plus spontaneous activity, enlarged complex motor unit potential was decreased recruitment patterns.   Left vastus lateralis, biceps femoris long head: Increased insertional activity, no spontaneous activity, enlarged complex motor unit potential with decreased recruitment patterns.   Right tibialis anterior, peroneal longus, tibialis posterior, medial gastrocnemius, vastus lateralis, biceps femoris long head: Increased insertional activity, no spontaneous activity, enlarged complex motor unit potential with decreased recruitment patterns.   There was no spontaneous activity at  bilateral lumbar sacral paraspinal muscles at bilateral L4-5 S1.   Right first dorsal interossei, pronator teres, flexor carpi ulnaris, biceps, triceps, deltoid: Normal insertion activity, no spontaneous activity, mild enlargement motor unit potential, with mildly decreased recruitment patterns.  There was no spontaneous activity at right cervical paraspinal muscles, right C5-6 and 7.  Needle examination was performed at bilateral abductor pollicis brevis muscles: Increased insertional activity, decreased motor unit potentials, enlarged complex, remote motor unit potentials with decreased recruitment patterns.  IMPRESSION: This is an abnormal study. There is electrodiagnostic evidence of chronic bilateral lumbosacral radiculopathies, involving left leg more than the right leg, mainly involving bilateral L3, 4, L5, S1 myotomes. There is also evidence of chronic right cervical radiculopathy, involving right C5-T1 myotomes. In addition, there is evidence of severe bilateral carpal tunnel syndromes.     INTERPRETING PHYSICIAN:   Marcial Pacas M.D. Ph.D. [facility] [address], [place], [place] [address] [phone]

## 2015-09-26 NOTE — Progress Notes (Signed)
Electrodiagnostic study was performed today, there was significant abnormality, please see separate report under procedure section

## 2015-10-02 DIAGNOSIS — R030 Elevated blood-pressure reading, without diagnosis of hypertension: Secondary | ICD-10-CM | POA: Diagnosis not present

## 2015-10-02 DIAGNOSIS — M4317 Spondylolisthesis, lumbosacral region: Secondary | ICD-10-CM | POA: Diagnosis not present

## 2015-10-04 ENCOUNTER — Other Ambulatory Visit: Payer: Self-pay | Admitting: Neurological Surgery

## 2015-10-24 ENCOUNTER — Telehealth: Payer: Self-pay | Admitting: Neurology

## 2015-10-24 NOTE — Telephone Encounter (Signed)
Addendum to previous office note by neurosurgeon

## 2015-10-31 ENCOUNTER — Telehealth: Payer: Self-pay | Admitting: Family Medicine

## 2015-10-31 MED ORDER — INTERFERON BETA-1A 30 MCG/0.5ML IM AJKT: 30.0000 ug | AUTO-INJECTOR | INTRAMUSCULAR | Status: DC

## 2015-10-31 NOTE — Telephone Encounter (Signed)
Rx done. 

## 2015-10-31 NOTE — Telephone Encounter (Signed)
Ok to refill once for 3 months but request further refills come from neurologist. Thanks.

## 2015-10-31 NOTE — Telephone Encounter (Signed)
Pt needs new rx avonex sent to [facility]

## 2015-11-19 ENCOUNTER — Other Ambulatory Visit: Payer: Self-pay | Admitting: Family Medicine

## 2015-11-21 ENCOUNTER — Encounter: Payer: Self-pay | Admitting: Internal Medicine

## 2015-11-21 ENCOUNTER — Ambulatory Visit (INDEPENDENT_AMBULATORY_CARE_PROVIDER_SITE_OTHER): Payer: Medicare Other | Admitting: Internal Medicine

## 2015-11-21 VITALS — BP 138/80 | HR 88

## 2015-11-21 DIAGNOSIS — Z791 Long term (current) use of non-steroidal anti-inflammatories (NSAID): Secondary | ICD-10-CM

## 2015-11-21 DIAGNOSIS — R159 Full incontinence of feces: Secondary | ICD-10-CM

## 2015-11-21 NOTE — Progress Notes (Addendum)
HISTORY OF PRESENT ILLNESS:  Larry [name] is a 65 y.o. 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 He is sent today by his primary care provider Dr. Maudie Mercury regarding bowel habit issues as a chief complaint. He is accompanied by his daughter. Specifically, the patient reports a slightly greater than one year history of occasional problems discriminating between flatus and feces. He will go to pass flatus, only to notice a small amount of fecal material. This happens proximal once every 6-8 weeks. The incontinence is minor. No nocturnal component. He denies other symptoms such as weight loss, abdominal pain, abdominal bloating, diarrhea, constipation, or rectal bleeding. He reports having had a previous colonoscopy with the [facility]. Colonoscopy report being requested. He is anticipating repeat back surgery in the near future. He ambulates slowly with crutches. Patient does take NSAIDs for pain.  REVIEW OF SYSTEMS:  All non-GI ROS negative except for ankle swelling, hip pain  Past Medical History  Diagnosis Date  . MS (multiple sclerosis) [facility])     sees [facility]  . Diabetes mellitus   . Hypertension   . Dementia   . Abnormality of gait   . Acute delirium 12/11/2011  . Hypercholesterolemia   . T10 spinal cord injury ([place]) 12/12/2011    hx surgery 1996 with bilat gait abnormality since  . Leukocytopenia 05/22/2013  . CTS (carpal tunnel syndrome) 09/26/2015    Past Surgical History  Procedure Laterality Date  . Lumbar laminectomy    . Back surgery      Social History Kayden Gustafson  reports that he has never smoked. He does not have any smokeless tobacco history on file. He reports that he does not drink alcohol or use illicit drugs.  family history includes Hypertension in his mother and sister; Liver cancer in his father.  No Known Allergies      PHYSICAL EXAMINATION: Vital signs: BP 138/80 mmHg  Pulse 88  Constitutional: Pleasant, generally well-appearing, no acute distress. Requires crutches to ambulate Psychiatric: alert and oriented x3, cooperative Eyes: extraocular movements intact, anicteric, conjunctiva pink Mouth: oral pharynx moist, no lesions Neck: supple without thyromegaly Lymph: no lymphadenopathy Cardiovascular: heart regular rate and rhythm, no murmur Lungs: clear to auscultation bilaterally Abdomen: soft, nontender, nondistended, no obvious ascites, no peritoneal signs, normal bowel sounds, no organomegaly Rectal: Omitted Extremities: no clubbing cyanosis or lower extremity edema bilaterally Skin: no lesions on visible extremities Neuro: Partial paraplegia at T10 level. Currently allergen intact  ASSESSMENT:  #1. Occasional inability to discriminate between gas and fecal material. Infrequent, minor, stable. Nonspecific. Discussed conditions that can result in such problem such as medications, food items, diabetes bacterial overgrowth, decreased sphincter tone from neurologic disorders. #2. Chronic NSAID use (2 types) in an elderly gentleman with medical problems. May benefit from PPI administration concurrently to reduce the risk of ulcer disease   PLAN:  #1. Probiotic align. One daily for one month. May use on demand if helpful. Multiple samples provided #2. Requested previous outside colonoscopy report for review sure he didn't have significant neoplasia or some other problem that would require strict follow-up #3. Recommend omeprazole 20 mg daily, as long as taking NSAIDs. Informed. May be obtained prescription or OTC #4. Return to the care of PCP. GI follow-up as needed  A copy of this consultation and has been sent to Dr. Clarene Essex 01/30/2016 Outside colonoscopy report received. Complete colonoscopy with excellent preparation performed  by Dr. Cristina Gong 06/25/2003 was normal. No additional  recommendations

## 2015-11-21 NOTE — Patient Instructions (Signed)
We have given you samples of Align. This puts good bacteria back into your colon. You should take 1 capsule by mouth once daily. If this works well for you, it can be purchased over the counter.   Please follow up as needed

## 2015-11-22 ENCOUNTER — Telehealth: Payer: Self-pay

## 2015-11-22 MED ORDER — OMEPRAZOLE 20 MG PO CPDR: 20.0000 mg | DELAYED_RELEASE_CAPSULE | Freq: Every day | ORAL | Status: DC

## 2015-11-22 NOTE — Telephone Encounter (Signed)
Pt aware and script sent to pharmacy. 

## 2015-11-22 NOTE — Telephone Encounter (Signed)
-----   Message from Irene Shipper, MD sent at 11/21/2015  2:10 PM EDT ----- Regarding: PPI use Larry [name], I saw this patient in the office today with his daughter regarding bowel habit issues. I did notice that he is on chronic NSAIDs. I did not mention this to them, but I feel he would benefit by taking concurrent PPI daily as long as he is taking NSAIDs to reduce the risk of bleeding ulcer. Please contact the patient and share this information with him and his daughter. He may take omeprazole 20 mg daily over-the-counter or we would be happy to prescribe omeprazole 20 mg daily. Thanks. Document

## 2015-12-13 ENCOUNTER — Other Ambulatory Visit (HOSPITAL_COMMUNITY): Payer: Self-pay | Admitting: *Deleted

## 2015-12-13 NOTE — Pre-Procedure Instructions (Signed)
Larry [name]  12/13/2015      [facility] ([facility] [place] - [id number] [place] [address] Phone: [phone] Fax: [phone]  [employer], [place] [address] [place] [address] Phone: [phone] Fax: [place], [place] [address] Phone: [phone] Fax: [phone]    Your procedure is scheduled on Monday July 31st  Report to [facility] Admitting at 10:30 am  Call this number if you have problems the morning of surgery:  [phone]   Remember:  Do not eat food or drink liquids after midnight.  Take these medicines the morning of surgery with A SIP OF WATER baclofen, clonidine, Gabapentin, Omeprazole (Prilosec), Tramadol if needed, Vesicare  Stop taking aspirin, BC's, Goody's, Herbal medications, Fish Oil, ibuprofen, Advil, Motrin, Aleve, Meloxicam, Voltaren  Ask Dr Ellene Route about Avonex since its due Sun night or Mon morning  How to Manage Your Diabetes Before and After Surgery  Why is it important to control my blood sugar before and after surgery? . Improving blood sugar levels before and after surgery helps healing and can limit problems. . A way of improving blood sugar control is eating a healthy diet by: o  Eating less sugar and carbohydrates o  Increasing activity/exercise o  Talking with your doctor about reaching your blood sugar goals . High blood sugars (greater than 180 mg/dL) can raise your risk of infections and slow your recovery, so you will need to focus on controlling your diabetes during the weeks before surgery. . Make sure that the doctor who takes care of your diabetes knows about your planned surgery including the date and location.  How do I manage my blood sugar before surgery? . Check your blood sugar at least 4 times a day, starting 2 days  before surgery, to make sure that the level is not too high or low. o Check your blood sugar the morning of your surgery when you wake up and every 2 hours until you get to the Short Stay unit. . If your blood sugar is less than 70 mg/dL, you will need to treat for low blood sugar: o Do not take insulin. o Treat a low blood sugar (less than 70 mg/dL) with  cup of clear juice (cranberry or apple), 4 glucose tablets, OR glucose gel. o Recheck blood sugar in 15 minutes after treatment (to make sure it is greater than 70 mg/dL). If your blood sugar is not greater than 70 mg/dL on recheck, call [phone] for further instructions. . Report your blood sugar to the short stay nurse when you get to Short Stay.  . If you are admitted to the hospital after surgery: o Your blood sugar will be checked by the staff and you will probably be given insulin after surgery (instead of oral diabetes medicines) to make sure you have good blood sugar levels. o The goal for blood sugar control after surgery is 80-180 mg/dL.  WHAT DO I DO ABOUT MY DIABETES MEDICATION?   Do not take oral diabetes medicines (pills) the morning of surgery. (metformin) Glucophage or Actos    . The day of surgery, do not take other diabetes injectables, including Byetta (exenatide), Bydureon (exenatide ER), Victoza (liraglutide), or Trulicity (dulaglutide).  . If your CBG is greater than 220 mg/dL, you may take  of your sliding scale (correction) dose of insulin.   Reviewed and Endorsed by [facility] Patient Education Committee, August 2015  Do not wear jewelry.  Do not wear lotions, powders, or cologne.  You may not wear deoderant.  Men may shave face and neck.  Do not bring valuables to the hospital.  [facility] is not responsible for any belongings or valuables.  Contacts, dentures or bridgework may not be worn into surgery.  Leave your suitcase in the car.  After surgery it may be brought to your room.  For patients  admitted to the hospital, discharge time will be determined by your treatment team.   Special instructions:  Shower with CHG the night before and morning of surgery as instructed  Please read over the following fact sheets that you were given. MRSA Information, shower instructions

## 2015-12-16 ENCOUNTER — Other Ambulatory Visit: Payer: Self-pay

## 2015-12-16 ENCOUNTER — Encounter (HOSPITAL_COMMUNITY): Payer: Self-pay

## 2015-12-16 ENCOUNTER — Encounter (HOSPITAL_COMMUNITY)
Admission: RE | Admit: 2015-12-16 | Discharge: 2015-12-16 | Disposition: A | Discharge status: Home / Self Care | Payer: Medicare Other | Source: Ambulatory Visit | Attending: Neurological Surgery | Admitting: Neurological Surgery

## 2015-12-16 DIAGNOSIS — Z01812 Encounter for preprocedural laboratory examination: Secondary | ICD-10-CM | POA: Insufficient documentation

## 2015-12-16 HISTORY — DX: Calculus of kidney: N20.0

## 2015-12-16 LAB — BASIC METABOLIC PANEL
ANION GAP: 6 (ref 5–15)
BUN: 22 mg/dL — ABNORMAL HIGH (ref 6–20)
CHLORIDE: 106 mmol/L (ref 101–111)
CO2: 28 mmol/L (ref 22–32)
Calcium: 9.6 mg/dL (ref 8.9–10.3)
Creatinine, Ser: 0.96 mg/dL (ref 0.61–1.24)
GFR calc non Af Amer: >60 mL/min (ref >60)
Glucose, Bld: 96 mg/dL (ref 65–99)
Potassium: 3.4 mmol/L — ABNORMAL LOW (ref 3.5–5.1)
Sodium: 140 mmol/L (ref 135–145)

## 2015-12-16 LAB — TYPE AND SCREEN
ABO/RH(D): B POS
Antibody Screen: NEGATIVE

## 2015-12-16 LAB — CBC
HCT: 32 % — ABNORMAL LOW (ref 39.0–52.0)
HEMOGLOBIN: 10.3 g/dL — AB (ref 13.0–17.0)
MCH: 30.7 pg (ref 26.0–34.0)
MCHC: 32.2 g/dL (ref 30.0–36.0)
MCV: 95.2 fL (ref 78.0–100.0)
Platelets: 251 10*3/uL (ref 150–400)
RBC: 3.36 MIL/uL — AB (ref 4.22–5.81)
RDW: 14 % (ref 11.5–15.5)
WBC: 3.4 10*3/uL — ABNORMAL LOW (ref 4.0–10.5)

## 2015-12-16 LAB — SURGICAL PCR SCREEN
MRSA, PCR: NEGATIVE
Staphylococcus aureus: POSITIVE — AB

## 2015-12-16 LAB — ABO/RH: ABO/RH(D): B POS

## 2015-12-16 NOTE — Progress Notes (Signed)
[facility] [address] for Mupirocin oint. Mr Alison called and message left to inform him to get script.

## 2015-12-16 NOTE — Progress Notes (Signed)
PCP is Dr. Colin Benton Denies ever seeing a cardiologist. Denies ever having a card cath, stress test, or echo Reports fasting cbg's run 80-110

## 2015-12-17 LAB — HEMOGLOBIN A1C
HEMOGLOBIN A1C: 6 % — AB (ref 4.8–5.6)
MEAN PLASMA GLUCOSE: 126 mg/dL

## 2015-12-29 MED ORDER — CEFAZOLIN SODIUM-DEXTROSE 2-4 GM/100ML-% IV SOLN
2.0000 g | INTRAVENOUS | Status: AC
  Administered 2015-12-30: 2 g via INTRAVENOUS
  Filled 2015-12-29: qty 100

## 2015-12-30 ENCOUNTER — Inpatient Hospital Stay (HOSPITAL_COMMUNITY)
Admission: RE | Admit: 2015-12-30 | Discharge: 2016-01-05 | DRG: 460 | Disposition: A | Discharge status: Home / Self Care | Payer: Medicare Other | Source: Ambulatory Visit | Attending: Neurological Surgery | Admitting: Neurological Surgery

## 2015-12-30 ENCOUNTER — Encounter (HOSPITAL_COMMUNITY)
Admission: RE | Disposition: A | Discharge status: Home / Self Care | Payer: Self-pay | Source: Ambulatory Visit | Attending: Neurological Surgery

## 2015-12-30 ENCOUNTER — Inpatient Hospital Stay (HOSPITAL_COMMUNITY): Payer: Medicare Other | Admitting: Anesthesiology

## 2015-12-30 ENCOUNTER — Inpatient Hospital Stay (HOSPITAL_COMMUNITY): Payer: Medicare Other

## 2015-12-30 ENCOUNTER — Encounter (HOSPITAL_COMMUNITY): Payer: Self-pay | Admitting: *Deleted

## 2015-12-30 DIAGNOSIS — N179 Acute kidney failure, unspecified: Secondary | ICD-10-CM | POA: Diagnosis not present

## 2015-12-30 DIAGNOSIS — N3281 Overactive bladder: Secondary | ICD-10-CM | POA: Diagnosis present

## 2015-12-30 DIAGNOSIS — Z981 Arthrodesis status: Secondary | ICD-10-CM | POA: Diagnosis not present

## 2015-12-30 DIAGNOSIS — G8220 Paraplegia, unspecified: Secondary | ICD-10-CM | POA: Diagnosis not present

## 2015-12-30 DIAGNOSIS — R3911 Hesitancy of micturition: Secondary | ICD-10-CM | POA: Diagnosis not present

## 2015-12-30 DIAGNOSIS — M5126 Other intervertebral disc displacement, lumbar region: Secondary | ICD-10-CM | POA: Diagnosis not present

## 2015-12-30 DIAGNOSIS — G35 Multiple sclerosis: Secondary | ICD-10-CM | POA: Diagnosis present

## 2015-12-30 DIAGNOSIS — E7800 Pure hypercholesterolemia, unspecified: Secondary | ICD-10-CM | POA: Diagnosis not present

## 2015-12-30 DIAGNOSIS — D62 Acute posthemorrhagic anemia: Secondary | ICD-10-CM | POA: Diagnosis not present

## 2015-12-30 DIAGNOSIS — R338 Other retention of urine: Secondary | ICD-10-CM | POA: Diagnosis not present

## 2015-12-30 DIAGNOSIS — Z8249 Family history of ischemic heart disease and other diseases of the circulatory system: Secondary | ICD-10-CM | POA: Diagnosis not present

## 2015-12-30 DIAGNOSIS — M549 Dorsalgia, unspecified: Secondary | ICD-10-CM | POA: Diagnosis not present

## 2015-12-30 DIAGNOSIS — R339 Retention of urine, unspecified: Secondary | ICD-10-CM | POA: Diagnosis not present

## 2015-12-30 DIAGNOSIS — F329 Major depressive disorder, single episode, unspecified: Secondary | ICD-10-CM | POA: Diagnosis present

## 2015-12-30 DIAGNOSIS — M47897 Other spondylosis, lumbosacral region: Secondary | ICD-10-CM | POA: Diagnosis present

## 2015-12-30 DIAGNOSIS — M4806 Spinal stenosis, lumbar region: Principal | ICD-10-CM | POA: Diagnosis present

## 2015-12-30 DIAGNOSIS — R509 Fever, unspecified: Secondary | ICD-10-CM | POA: Diagnosis not present

## 2015-12-30 DIAGNOSIS — M4807 Spinal stenosis, lumbosacral region: Secondary | ICD-10-CM | POA: Diagnosis not present

## 2015-12-30 DIAGNOSIS — Z800 Family history of malignant neoplasm of digestive organs: Secondary | ICD-10-CM | POA: Diagnosis not present

## 2015-12-30 DIAGNOSIS — R000 Tachycardia, unspecified: Secondary | ICD-10-CM | POA: Diagnosis not present

## 2015-12-30 DIAGNOSIS — M6281 Muscle weakness (generalized): Secondary | ICD-10-CM | POA: Diagnosis not present

## 2015-12-30 DIAGNOSIS — R5082 Postprocedural fever: Secondary | ICD-10-CM | POA: Diagnosis not present

## 2015-12-30 DIAGNOSIS — M5416 Radiculopathy, lumbar region: Secondary | ICD-10-CM | POA: Diagnosis not present

## 2015-12-30 DIAGNOSIS — G8918 Other acute postprocedural pain: Secondary | ICD-10-CM

## 2015-12-30 DIAGNOSIS — Z419 Encounter for procedure for purposes other than remedying health state, unspecified: Secondary | ICD-10-CM | POA: Diagnosis not present

## 2015-12-30 DIAGNOSIS — I10 Essential (primary) hypertension: Secondary | ICD-10-CM | POA: Diagnosis present

## 2015-12-30 DIAGNOSIS — Z4889 Encounter for other specified surgical aftercare: Secondary | ICD-10-CM | POA: Diagnosis not present

## 2015-12-30 DIAGNOSIS — M4726 Other spondylosis with radiculopathy, lumbar region: Secondary | ICD-10-CM | POA: Diagnosis not present

## 2015-12-30 DIAGNOSIS — M48061 Spinal stenosis, lumbar region without neurogenic claudication: Secondary | ICD-10-CM | POA: Diagnosis present

## 2015-12-30 DIAGNOSIS — N9989 Other postprocedural complications and disorders of genitourinary system: Secondary | ICD-10-CM | POA: Diagnosis not present

## 2015-12-30 DIAGNOSIS — E119 Type 2 diabetes mellitus without complications: Secondary | ICD-10-CM | POA: Diagnosis not present

## 2015-12-30 DIAGNOSIS — M4316 Spondylolisthesis, lumbar region: Secondary | ICD-10-CM | POA: Diagnosis present

## 2015-12-30 DIAGNOSIS — G629 Polyneuropathy, unspecified: Secondary | ICD-10-CM

## 2015-12-30 DIAGNOSIS — M5116 Intervertebral disc disorders with radiculopathy, lumbar region: Secondary | ICD-10-CM | POA: Diagnosis not present

## 2015-12-30 LAB — GLUCOSE, CAPILLARY
GLUCOSE-CAPILLARY: 97 mg/dL (ref 65–99)
Glucose-Capillary: 89 mg/dL (ref 65–99)
Glucose-Capillary: 124 mg/dL — ABNORMAL HIGH (ref 65–99)

## 2015-12-30 SURGERY — POSTERIOR LUMBAR FUSION 1 LEVEL
Anesthesia: General | Site: Back

## 2015-12-30 MED ORDER — PROMETHAZINE HCL 25 MG/ML IJ SOLN: 6.2500 mg | INTRAMUSCULAR | Status: DC | PRN

## 2015-12-30 MED ORDER — LIDOCAINE HCL (CARDIAC) 20 MG/ML IV SOLN
INTRAVENOUS | Status: DC | PRN
  Administered 2015-12-30: 100 mg via INTRAVENOUS

## 2015-12-30 MED ORDER — LACTATED RINGERS IV SOLN
INTRAVENOUS | Status: DC
  Administered 2015-12-30 (×2): via INTRAVENOUS

## 2015-12-30 MED ORDER — PHENYLEPHRINE HCL 10 MG/ML IJ SOLN
INTRAVENOUS | Status: DC | PRN
  Administered 2015-12-30: 25 ug/min via INTRAVENOUS

## 2015-12-30 MED ORDER — MENTHOL 3 MG MT LOZG: 1.0000 | LOZENGE | OROMUCOSAL | Status: DC | PRN

## 2015-12-30 MED ORDER — EPHEDRINE 5 MG/ML INJ
INTRAVENOUS | Status: AC
  Filled 2015-12-30: qty 10

## 2015-12-30 MED ORDER — SODIUM CHLORIDE 0.9 % IR SOLN
Status: DC | PRN
  Administered 2015-12-30: 500 mL

## 2015-12-30 MED ORDER — VECURONIUM BROMIDE 10 MG IV SOLR
INTRAVENOUS | Status: AC
  Filled 2015-12-30: qty 10

## 2015-12-30 MED ORDER — ONDANSETRON HCL 4 MG/2ML IJ SOLN
INTRAMUSCULAR | Status: AC
  Filled 2015-12-30: qty 2

## 2015-12-30 MED ORDER — LIDOCAINE 2% (20 MG/ML) 5 ML SYRINGE
INTRAMUSCULAR | Status: AC
  Filled 2015-12-30: qty 5

## 2015-12-30 MED ORDER — THROMBIN 5000 UNITS EX SOLR
OROMUCOSAL | Status: DC | PRN
  Administered 2015-12-30: 5 mL via TOPICAL

## 2015-12-30 MED ORDER — SODIUM CHLORIDE 0.9% FLUSH: 3.0000 mL | INTRAVENOUS | Status: DC | PRN

## 2015-12-30 MED ORDER — GABAPENTIN 400 MG PO CAPS
1600.0000 mg | ORAL_CAPSULE | Freq: Two times a day (BID) | ORAL | Status: DC
  Administered 2015-12-30 – 2016-01-05 (×12): 1600 mg via ORAL
  Filled 2015-12-30 (×12): qty 4

## 2015-12-30 MED ORDER — MORPHINE SULFATE (PF) 2 MG/ML IV SOLN
1.0000 mg | INTRAVENOUS | Status: DC | PRN
  Administered 2015-12-31 – 2016-01-03 (×5): 2 mg via INTRAVENOUS
  Filled 2015-12-30 (×6): qty 1

## 2015-12-30 MED ORDER — ROCURONIUM BROMIDE 100 MG/10ML IV SOLN
INTRAVENOUS | Status: DC | PRN
  Administered 2015-12-30: 10 mg via INTRAVENOUS
  Administered 2015-12-30: 50 mg via INTRAVENOUS
  Administered 2015-12-30: 10 mg via INTRAVENOUS

## 2015-12-30 MED ORDER — AMLODIPINE BESYLATE 10 MG PO TABS
10.0000 mg | ORAL_TABLET | Freq: Every day | ORAL | Status: DC
  Administered 2016-01-02 – 2016-01-05 (×4): 10 mg via ORAL
  Filled 2015-12-30 (×6): qty 1

## 2015-12-30 MED ORDER — PANTOPRAZOLE SODIUM 40 MG PO TBEC
40.0000 mg | DELAYED_RELEASE_TABLET | Freq: Every day | ORAL | Status: DC
  Administered 2015-12-30 – 2016-01-05 (×7): 40 mg via ORAL
  Filled 2015-12-30 (×7): qty 1

## 2015-12-30 MED ORDER — IRBESARTAN 300 MG PO TABS
300.0000 mg | ORAL_TABLET | Freq: Every day | ORAL | Status: DC
  Administered 2015-12-31 – 2016-01-05 (×6): 300 mg via ORAL
  Filled 2015-12-30 (×8): qty 1

## 2015-12-30 MED ORDER — PIOGLITAZONE HCL 30 MG PO TABS
30.0000 mg | ORAL_TABLET | Freq: Every day | ORAL | Status: DC
  Administered 2015-12-31 – 2016-01-05 (×6): 30 mg via ORAL
  Filled 2015-12-30 (×6): qty 1

## 2015-12-30 MED ORDER — ACETAMINOPHEN 10 MG/ML IV SOLN
INTRAVENOUS | Status: AC
  Filled 2015-12-30: qty 100

## 2015-12-30 MED ORDER — INSULIN ASPART 100 UNIT/ML ~~LOC~~ SOLN: 0.0000 [IU] | Freq: Every day | SUBCUTANEOUS | Status: DC

## 2015-12-30 MED ORDER — METFORMIN HCL 500 MG PO TABS
1000.0000 mg | ORAL_TABLET | Freq: Two times a day (BID) | ORAL | Status: DC
Start: 2015-12-31 — End: 2016-01-05
  Administered 2015-12-31 – 2016-01-05 (×11): 1000 mg via ORAL
  Filled 2015-12-30 (×11): qty 2

## 2015-12-30 MED ORDER — HYDROCHLOROTHIAZIDE 12.5 MG PO CAPS
12.5000 mg | ORAL_CAPSULE | Freq: Every day | ORAL | Status: DC
  Administered 2015-12-30 – 2016-01-05 (×6): 12.5 mg via ORAL
  Filled 2015-12-30 (×7): qty 1

## 2015-12-30 MED ORDER — THROMBIN 20000 UNITS EX SOLR
CUTANEOUS | Status: DC | PRN
  Administered 2015-12-30: 20 mL via TOPICAL

## 2015-12-30 MED ORDER — BACLOFEN 10 MG PO TABS: 20.0000 mg | ORAL_TABLET | ORAL | Status: DC

## 2015-12-30 MED ORDER — FENTANYL CITRATE (PF) 250 MCG/5ML IJ SOLN
INTRAMUSCULAR | Status: AC
  Filled 2015-12-30: qty 5

## 2015-12-30 MED ORDER — SODIUM CHLORIDE 0.9 % IV SOLN
INTRAVENOUS | Status: DC
  Administered 2015-12-30 – 2016-01-01 (×4): via INTRAVENOUS

## 2015-12-30 MED ORDER — MIDAZOLAM HCL 2 MG/2ML IJ SOLN
INTRAMUSCULAR | Status: AC
  Filled 2015-12-30: qty 2

## 2015-12-30 MED ORDER — AMITRIPTYLINE HCL 25 MG PO TABS
25.0000 mg | ORAL_TABLET | Freq: Every day | ORAL | Status: DC
  Administered 2015-12-30 – 2016-01-04 (×6): 25 mg via ORAL
  Filled 2015-12-30 (×6): qty 1

## 2015-12-30 MED ORDER — SODIUM CHLORIDE 0.9% FLUSH
3.0000 mL | Freq: Two times a day (BID) | INTRAVENOUS | Status: DC
  Administered 2015-12-31 – 2016-01-04 (×5): 3 mL via INTRAVENOUS

## 2015-12-30 MED ORDER — CLONIDINE HCL 0.1 MG PO TABS
0.1000 mg | ORAL_TABLET | Freq: Two times a day (BID) | ORAL | Status: DC
  Administered 2015-12-30 – 2016-01-05 (×10): 0.1 mg via ORAL
  Filled 2015-12-30 (×11): qty 1

## 2015-12-30 MED ORDER — INSULIN ASPART 100 UNIT/ML ~~LOC~~ SOLN
0.0000 [IU] | Freq: Three times a day (TID) | SUBCUTANEOUS | Status: DC
  Administered 2015-12-31: 3 [IU] via SUBCUTANEOUS
  Administered 2015-12-31: 2 [IU] via SUBCUTANEOUS
  Administered 2016-01-01: 3 [IU] via SUBCUTANEOUS
  Administered 2016-01-02 – 2016-01-04 (×2): 2 [IU] via SUBCUTANEOUS

## 2015-12-30 MED ORDER — ONDANSETRON HCL 4 MG/2ML IJ SOLN: 4.0000 mg | INTRAMUSCULAR | Status: DC | PRN

## 2015-12-30 MED ORDER — EPHEDRINE SULFATE 50 MG/ML IJ SOLN
INTRAMUSCULAR | Status: DC | PRN
  Administered 2015-12-30 (×2): 10 mg via INTRAVENOUS

## 2015-12-30 MED ORDER — SUGAMMADEX SODIUM 200 MG/2ML IV SOLN
INTRAVENOUS | Status: DC | PRN
  Administered 2015-12-30: 200 mg via INTRAVENOUS

## 2015-12-30 MED ORDER — GABAPENTIN 800 MG PO TABS: 1600.0000 mg | ORAL_TABLET | Freq: Two times a day (BID) | ORAL | Status: DC

## 2015-12-30 MED ORDER — ACETAMINOPHEN 650 MG RE SUPP: 650.0000 mg | RECTAL | Status: DC | PRN

## 2015-12-30 MED ORDER — METHOCARBAMOL 1000 MG/10ML IJ SOLN
500.0000 mg | Freq: Four times a day (QID) | INTRAMUSCULAR | Status: DC | PRN
  Filled 2015-12-30: qty 5

## 2015-12-30 MED ORDER — ROCURONIUM BROMIDE 50 MG/5ML IV SOLN
INTRAVENOUS | Status: AC
  Filled 2015-12-30: qty 1

## 2015-12-30 MED ORDER — ALBUMIN HUMAN 5 % IV SOLN
INTRAVENOUS | Status: DC | PRN
  Administered 2015-12-30: 18:00:00 via INTRAVENOUS

## 2015-12-30 MED ORDER — PROPOFOL 10 MG/ML IV BOLUS
INTRAVENOUS | Status: DC | PRN
  Administered 2015-12-30: 150 mg via INTRAVENOUS

## 2015-12-30 MED ORDER — METHOCARBAMOL 500 MG PO TABS
500.0000 mg | ORAL_TABLET | Freq: Four times a day (QID) | ORAL | Status: DC | PRN
  Administered 2015-12-31 – 2016-01-04 (×5): 500 mg via ORAL
  Filled 2015-12-30 (×6): qty 1

## 2015-12-30 MED ORDER — LIDOCAINE-EPINEPHRINE 1 %-1:100000 IJ SOLN
INTRAMUSCULAR | Status: DC | PRN
  Administered 2015-12-30: 10 mL

## 2015-12-30 MED ORDER — TRAZODONE HCL 50 MG PO TABS
50.0000 mg | ORAL_TABLET | Freq: Every day | ORAL | Status: DC
  Administered 2015-12-30 – 2016-01-04 (×6): 50 mg via ORAL
  Filled 2015-12-30 (×6): qty 1

## 2015-12-30 MED ORDER — PHENYLEPHRINE 40 MCG/ML (10ML) SYRINGE FOR IV PUSH (FOR BLOOD PRESSURE SUPPORT)
PREFILLED_SYRINGE | INTRAVENOUS | Status: AC
  Filled 2015-12-30: qty 10

## 2015-12-30 MED ORDER — 0.9 % SODIUM CHLORIDE (POUR BTL) OPTIME
TOPICAL | Status: DC | PRN
  Administered 2015-12-30: 1000 mL

## 2015-12-30 MED ORDER — BACLOFEN 10 MG PO TABS
40.0000 mg | ORAL_TABLET | Freq: Every day | ORAL | Status: DC
  Administered 2015-12-30 – 2016-01-04 (×6): 40 mg via ORAL
  Filled 2015-12-30 (×6): qty 4

## 2015-12-30 MED ORDER — DARIFENACIN HYDROBROMIDE ER 7.5 MG PO TB24
7.5000 mg | ORAL_TABLET | Freq: Every day | ORAL | Status: DC
Start: 2015-12-31 — End: 2016-01-05
  Administered 2015-12-31 – 2016-01-05 (×6): 7.5 mg via ORAL
  Filled 2015-12-30 (×7): qty 1

## 2015-12-30 MED ORDER — CEFAZOLIN IN D5W 1 GM/50ML IV SOLN
1.0000 g | Freq: Three times a day (TID) | INTRAVENOUS | Status: AC
  Administered 2015-12-30 – 2015-12-31 (×2): 1 g via INTRAVENOUS
  Filled 2015-12-30 (×2): qty 50

## 2015-12-30 MED ORDER — ACETAMINOPHEN 325 MG PO TABS
650.0000 mg | ORAL_TABLET | ORAL | Status: DC | PRN
  Administered 2015-12-31 – 2016-01-04 (×11): 650 mg via ORAL
  Filled 2015-12-30 (×11): qty 2

## 2015-12-30 MED ORDER — PHENOL 1.4 % MT LIQD
1.0000 | OROMUCOSAL | Status: DC | PRN
Start: 2015-12-30 — End: 2016-01-05

## 2015-12-30 MED ORDER — SODIUM CHLORIDE 0.9 % IV SOLN: 250.0000 mL | INTRAVENOUS | Status: DC

## 2015-12-30 MED ORDER — INTERFERON BETA-1A 30 MCG/0.5ML IM AJKT: 10.0000 ug | AUTO-INJECTOR | INTRAMUSCULAR | Status: DC

## 2015-12-30 MED ORDER — SODIUM CHLORIDE 0.9 % IV SOLN
INTRAVENOUS | Status: DC | PRN
  Administered 2015-12-30: 18:00:00 via INTRAVENOUS

## 2015-12-30 MED ORDER — PHENYLEPHRINE HCL 10 MG/ML IJ SOLN
INTRAMUSCULAR | Status: DC | PRN
  Administered 2015-12-30: 80 ug via INTRAVENOUS
  Administered 2015-12-30: 40 ug via INTRAVENOUS
  Administered 2015-12-30 (×2): 80 ug via INTRAVENOUS
  Administered 2015-12-30: 120 ug via INTRAVENOUS
  Administered 2015-12-30: 40 ug via INTRAVENOUS
  Administered 2015-12-30: 80 ug via INTRAVENOUS

## 2015-12-30 MED ORDER — ONDANSETRON HCL 4 MG/2ML IJ SOLN
INTRAMUSCULAR | Status: DC | PRN
  Administered 2015-12-30: 4 mg via INTRAVENOUS

## 2015-12-30 MED ORDER — FENTANYL CITRATE (PF) 100 MCG/2ML IJ SOLN
INTRAMUSCULAR | Status: DC | PRN
  Administered 2015-12-30: 50 ug via INTRAVENOUS
  Administered 2015-12-30: 100 ug via INTRAVENOUS

## 2015-12-30 MED ORDER — BUPIVACAINE HCL (PF) 0.5 % IJ SOLN
INTRAMUSCULAR | Status: DC | PRN
  Administered 2015-12-30: 20 mL
  Administered 2015-12-30: 10 mL

## 2015-12-30 MED ORDER — BACLOFEN 10 MG PO TABS
20.0000 mg | ORAL_TABLET | Freq: Every morning | ORAL | Status: DC
  Administered 2015-12-31 – 2016-01-05 (×6): 20 mg via ORAL
  Filled 2015-12-30 (×6): qty 2

## 2015-12-30 MED ORDER — MEPERIDINE HCL 25 MG/ML IJ SOLN: 6.2500 mg | INTRAMUSCULAR | Status: DC | PRN

## 2015-12-30 MED ORDER — ALUM & MAG HYDROXIDE-SIMETH 200-200-20 MG/5ML PO SUSP: 30.0000 mL | Freq: Four times a day (QID) | ORAL | Status: DC | PRN

## 2015-12-30 MED ORDER — PROPOFOL 10 MG/ML IV BOLUS
INTRAVENOUS | Status: AC
  Filled 2015-12-30: qty 20

## 2015-12-30 MED ORDER — HYDROMORPHONE HCL 1 MG/ML IJ SOLN: 0.2500 mg | INTRAMUSCULAR | Status: DC | PRN

## 2015-12-30 MED ORDER — SUGAMMADEX SODIUM 500 MG/5ML IV SOLN
INTRAVENOUS | Status: AC
  Filled 2015-12-30: qty 5

## 2015-12-30 SURGICAL SUPPLY — 70 items
ADH SKN CLS APL DERMABOND .7 (GAUZE/BANDAGES/DRESSINGS) ×1
APL SRG 60D 8 XTD TIP BNDBL (TIP)
BAG DECANTER FOR FLEXI CONT (MISCELLANEOUS) ×2 IMPLANT
BLADE CLIPPER SURG (BLADE) IMPLANT
BUR MATCHSTICK NEURO 3.0 LAGG (BURR) ×2 IMPLANT
CAGE MAS PLIF 9X9X23-8 LUMBAR (Cage) ×2 IMPLANT
CANISTER SUCT 3000ML PPV (MISCELLANEOUS) ×2 IMPLANT
CONT SPEC 4OZ CLIKSEAL STRL BL (MISCELLANEOUS) ×2 IMPLANT
COVER BACK TABLE 60X90IN (DRAPES) ×2 IMPLANT
DECANTER SPIKE VIAL GLASS SM (MISCELLANEOUS) ×2 IMPLANT
DERMABOND ADVANCED (GAUZE/BANDAGES/DRESSINGS) ×1
DERMABOND ADVANCED .7 DNX12 (GAUZE/BANDAGES/DRESSINGS) ×1 IMPLANT
DEVICE DISSECT PLASMABLAD 3.0S (MISCELLANEOUS) ×1 IMPLANT
DRAPE C-ARM 42X72 X-RAY (DRAPES) ×4 IMPLANT
DRAPE HALF SHEET 40X57 (DRAPES) ×2 IMPLANT
DRAPE LAPAROTOMY 100X72X124 (DRAPES) ×2 IMPLANT
DRAPE POUCH INSTRU U-SHP 10X18 (DRAPES) ×2 IMPLANT
DRSG OPSITE POSTOP 4X6 (GAUZE/BANDAGES/DRESSINGS) ×1 IMPLANT
DURAPREP 26ML APPLICATOR (WOUND CARE) ×2 IMPLANT
DURASEAL APPLICATOR TIP (TIP) IMPLANT
DURASEAL SPINE SEALANT 3ML (MISCELLANEOUS) IMPLANT
ELECT REM PT RETURN 9FT ADLT (ELECTROSURGICAL) ×2
ELECTRODE REM PT RTRN 9FT ADLT (ELECTROSURGICAL) ×1 IMPLANT
GAUZE SPONGE 4X4 12PLY STRL (GAUZE/BANDAGES/DRESSINGS) ×2 IMPLANT
GAUZE SPONGE 4X4 16PLY XRAY LF (GAUZE/BANDAGES/DRESSINGS) IMPLANT
GLOVE BIO SURGEON STRL SZ 6.5 (GLOVE) ×3 IMPLANT
GLOVE BIOGEL PI IND STRL 6.5 (GLOVE) ×1 IMPLANT
GLOVE BIOGEL PI IND STRL 7.0 (GLOVE) IMPLANT
GLOVE BIOGEL PI IND STRL 7.5 (GLOVE) ×2 IMPLANT
GLOVE BIOGEL PI IND STRL 8.5 (GLOVE) ×2 IMPLANT
GLOVE BIOGEL PI INDICATOR 6.5 (GLOVE) ×1
GLOVE BIOGEL PI INDICATOR 7.0 (GLOVE) ×1
GLOVE BIOGEL PI INDICATOR 7.5 (GLOVE) ×2
GLOVE BIOGEL PI INDICATOR 8.5 (GLOVE) ×2
GLOVE ECLIPSE 8.5 STRL (GLOVE) ×4 IMPLANT
GLOVE SURG SS PI 7.0 STRL IVOR (GLOVE) ×2 IMPLANT
GOWN STRL REUS W/ TWL LRG LVL3 (GOWN DISPOSABLE) IMPLANT
GOWN STRL REUS W/ TWL XL LVL3 (GOWN DISPOSABLE) IMPLANT
GOWN STRL REUS W/TWL 2XL LVL3 (GOWN DISPOSABLE) ×4 IMPLANT
GOWN STRL REUS W/TWL LRG LVL3 (GOWN DISPOSABLE) ×2
GOWN STRL REUS W/TWL XL LVL3 (GOWN DISPOSABLE) ×2
HEMOSTAT POWDER KIT SURGIFOAM (HEMOSTASIS) ×1 IMPLANT
KIT BASIN OR (CUSTOM PROCEDURE TRAY) ×2 IMPLANT
KIT INFUSE SMALL (Orthopedic Implant) ×1 IMPLANT
KIT ROOM TURNOVER OR (KITS) ×2 IMPLANT
MILL MEDIUM DISP (BLADE) ×1 IMPLANT
MODULE POWER NUVASIVE (MISCELLANEOUS) IMPLANT
NEEDLE HYPO 22GX1.5 SAFETY (NEEDLE) ×2 IMPLANT
NS IRRIG 1000ML POUR BTL (IV SOLUTION) ×2 IMPLANT
PACK LAMINECTOMY NEURO (CUSTOM PROCEDURE TRAY) ×2 IMPLANT
PAD ARMBOARD 7.5X6 YLW CONV (MISCELLANEOUS) ×6 IMPLANT
PATTIES SURGICAL .5 X1 (DISPOSABLE) ×2 IMPLANT
PLASMABLADE 3.0S (MISCELLANEOUS) ×2
POWER MODULE NUVASIVE (MISCELLANEOUS) ×2
ROD RELINE-O LORD 5.5X30MM (Rod) ×2 IMPLANT
SCREW LOCK RELINE 5.5 TULIP (Screw) ×4 IMPLANT
SCREW RELINE-O POLY 6.5X45 (Screw) ×4 IMPLANT
SPONGE LAP 4X18 X RAY DECT (DISPOSABLE) IMPLANT
SPONGE SURGIFOAM ABS GEL 100 (HEMOSTASIS) ×2 IMPLANT
SUT PROLENE 6 0 BV (SUTURE) IMPLANT
SUT VIC AB 1 CT1 18XBRD ANBCTR (SUTURE) ×1 IMPLANT
SUT VIC AB 1 CT1 8-18 (SUTURE) ×2
SUT VIC AB 2-0 CP2 18 (SUTURE) ×2 IMPLANT
SUT VIC AB 3-0 SH 8-18 (SUTURE) ×2 IMPLANT
SYR 3ML LL SCALE MARK (SYRINGE) ×8 IMPLANT
TOWEL OR 17X24 6PK STRL BLUE (TOWEL DISPOSABLE) ×2 IMPLANT
TOWEL OR 17X26 10 PK STRL BLUE (TOWEL DISPOSABLE) ×2 IMPLANT
TRAP SPECIMEN MUCOUS 40CC (MISCELLANEOUS) ×2 IMPLANT
TRAY FOLEY W/METER SILVER 16FR (SET/KITS/TRAYS/PACK) ×2 IMPLANT
WATER STERILE IRR 1000ML POUR (IV SOLUTION) ×2 IMPLANT

## 2015-12-30 NOTE — Op Note (Signed)
Date of surgery: 12/30/2015 Preoperative diagnosis: Herniated nucleus pulposus with severe lumbar stenosis, spondylolisthesis, L4-5 (L5-S1). Lumbar stenosis, lumbar radiculopathy Postoperative diagnosis: Same Procedure: L4-L5 decompressive laminectomy decompression of L4 and L5 nerve roots, posterior lumbar interbody arthrodesis with peek spacers local autograft and allograft, pedicle screw fixation L4-L5, posterior lateral arthrodesis L4-L5  Surgeon: Kristeen Miss M.D.  Asst.: Deri Fuelling M.D.  Indications: Patient is Larry [name] is a 65 y.o. male who who's had significant  lumbar radiculopathy for over a years period time. A lumbar MRI demonstrates advanced spondylolisthesis with high-grade canal stenosis. he was advised regarding surgical intervention. His previously undergone surgery by me at the thoracic lumbar junction. At that time he was severely weak and he notes that the weakness has recurred and has been getting worse. Pain has not been an issue.  Numbering of the vertebrae has been an issue because the patient appears to have a fused L5-S1 segment. The first mobile segment is where his stenosis and spondylolisthesis is and this is been variably called L5-S1 but per the radiologist report on his MRI is noted to be L4-L5. He will be called L4-L5 for the purposes of this dictation.  Procedure: The patient was brought to the operating room supine on a stretcher. After the smooth induction of general endotracheal anesthesia she was turned prone and the back was prepped with alcohol and DuraPrep. The back was then draped sterilely. A midline incision was created and carried down to the lumbar dorsal fascia. A localizing radiograph identified the L4 and L5 spinous processes. A subligamentous dissection was created at L4 and L5 to expose the interlaminar space at L4 and L5 and the facet joints over the L4-L5 interspace. Laminotomies were were then created removing the entire inferior margin of  the lamina of L4 including the inferior facet at the L4-L5 joint. The yellow ligament was taken up and the common dural tube was exposed along with the L4 nerve root superiorly, and the L5 nerve root inferiorly, the disc space was exposed and epidural veins in this region were cauterized and divided. The L4 nerve roots and the L5 nerve root were dissected with care taken to protect them. The disc space was opened and a combination of curettes and rongeurs was used to evacuate the disc space fully. The endplates were removed using sharp curettes. An interbody spacer was placed to distract the disc space while the contralateral discectomy was performed. When the entirety of the disc was removed and the endplates were prepared final sizing of the disc space was obtained 9 mm 8 lordotic, 23 mm long peek spacers were chosen and packed with autograft and infuse and placed into the interspace. The remainder of the interspace was packed with autograft and infuse. Pedicle entry sites were then chosen using fluoroscopic guidance and 6.5 x 45 mm screws were placed in L4 and 6.5 x 45 mm screws were placed in L5. The lateral gutters were decorticated and graft and infuse was packed in the posterolateral gutters between L4 and L5. Final radiographs were obtained after placing appropriately sized rods between the pedicle screws at L4-L5 and torquing these to the appropriate tension. The surgical site was inspected carefully to assure the L4 and L5 nerve roots were well decompressed, hemostasis was obtained, and the graft was well packed. Then the retractors were removed and the wound was closed with #1 Vicryl in the lumbar dorsal fascia 2-0 Vicryl in the subcutaneous tissue and 3-0 Vicryl subcuticularly. When he cc of half percent  Marcaine was injected into the paraspinous musculature at the time of closure. Blood loss was estimated at 700 cc. the patient had been on diclofenac and baby aspirin had not stopped these prior to the  surgery. 150 mL of Cell Saver blood was returned to the patient. The patient tolerated procedure well and was returned to the recovery room in stable condition.

## 2015-12-30 NOTE — Progress Notes (Signed)
Dr. Ellene Route notified that patient did not stop his 81 mg Aspirin and Diclofenac 75 mg.  Patient reports last taking on 12/29/15.

## 2015-12-30 NOTE — Transfer of Care (Signed)
Immediate Anesthesia Transfer of Care Note  Patient: Larry [name]  Procedure(s) Performed: Procedure(s) with comments: Posterior Lumbar Five-Sacral One Interbody and Fusion (N/A) - L5-S1 Posterior lumbar interbody fusion  Patient Location: PACU  Anesthesia Type:General  Level of Consciousness: awake, alert  and oriented  Airway & Oxygen Therapy: Patient Spontanous Breathing and Patient connected to nasal cannula oxygen  Post-op Assessment: Report given to RN and Post -op Vital signs reviewed and stable  Post vital signs: Reviewed and stable  Last Vitals:  Vitals:   12/30/15 1026 12/30/15 1846  BP: (!) 148/89 131/87  Pulse: 92 91  Resp: 18 (!) 5  Temp: 36.6 C     Last Pain:  Vitals:   12/30/15 1026  TempSrc: Oral      Patients Stated Pain Goal: 1 ([id number])  Complications: No apparent anesthesia complications

## 2015-12-30 NOTE — Anesthesia Procedure Notes (Signed)
Procedure Name: Intubation Date/Time: 12/30/2015 3:12 PM Performed by: Jenne Campus Pre-anesthesia Checklist: Patient identified, Emergency Drugs available, Suction available, Patient being monitored and Timeout performed Patient Re-evaluated:Patient Re-evaluated prior to inductionOxygen Delivery Method: Circle system utilized Preoxygenation: Pre-oxygenation with 100% oxygen Intubation Type: IV induction Ventilation: Mask ventilation without difficulty, Oral airway inserted - appropriate to patient size and Two handed mask ventilation required Laryngoscope Size: Miller Grade View: Grade III Tube type: Oral Tube size: 7.5 mm Number of attempts: 1 Airway Equipment and Method: Bougie stylet Placement Confirmation: positive ETCO2 and breath sounds checked- equal and bilateral Secured at: 23 cm Tube secured with: Tape Dental Injury: Teeth and Oropharynx as per pre-operative assessment  Difficulty Due To: Difficulty was unanticipated and Difficult Airway- due to anterior larynx Comments: Smooth IV induction. EZ 2-handed mask. DL x 1 Mil 2. Grade 3 view. Unable to pass ETT through cords. Placed Bougie stylet easily through cords. 7.5 ETT passed over Bougie through cords. +ETCO2 bbse.

## 2015-12-30 NOTE — H&P (Signed)
CHIEF COMPLAINT:                                          Progressive difficulty with walking, changes in posture, difficulty with fine manipulation of his hands.  HISTORY OF PRESENT ILLNESS:                     [name] is a 65 year old, right-handed individual whom I had seen in the hospital back in 2013.  At that time, he had an acute loss of his ability to walk and he was seen in the hospital, where he had MRIs of the cervical and thoracic spines.  Those studies demonstrated that he had some compressive lesions at the level of T10-T11, and also the cervical spine had some stenosis at C4-5 and C5-6.  I had recommended surgery at that time, but after a few doses of some steroid medication [name] seemed to improve remarkably.  He notes that he has been dealing with weakness in his legs for a number of years, and back in 1996 he was diagnosed with multiple sclerosis.  This was after he had had a back surgery.  He notes that he had some weakness that was occurring in his legs and he stumbled and he fell, and he felt that his weakness was worsening and ultimately it was suggested that he had lumbar spinal stenosis.  He underwent a decompressive surgery, but he had no improvement and after a prolonged hospitalization in rehab, it was diagnosed that he had multiple sclerosis.  He has been on some anti-multiple-sclerosis medications, but he has had episodes where he felt stronger and weaker in his back and his legs.  He notes that his arms have always been strong.  However, since about 2010 he feels that he has lost some manipulative function in his hands such that buttoning buttons is difficult for him.  He does work out regularly to  maintain the strength of his upper extremities as he has been walking using forearm crutches in an effort to  maintain his stance.  REVIEW OF SYSTEMS:                                    Systems review is notable for the balance disturbance, high blood pressure, leg  pain, diabetes, and the leg weakness on a 14-point review sheet.  PAST MEDICAL HISTORY:                                His past medical history reveals that in general he has fairly good health, outside of the diagnosis of the multiple sclerosis.   . Medications and Allergies:  His medication list includes amitriptyline for nerve pain, aspirin, Avonex, baclofen, clonidine, diclofenac, gabapentin, MSN supplements, metformin for diabetes, pioglitazone, also for diabetes, tramadol for pain, Tribenzor for his blood pressure, Vesicare for his bladder function, vitamin B12 and vitamin D3 in supplementation.  SOCIAL HISTORY:                                            Personal history reveals that he does not smoke or use  alcohol.  PHYSICAL EXAMINATION:                                His height and weight have been stable at 5 feet 7 inches and 170 pounds.  NEUROLOGICAL EXAMINATION:                       On physical examination I note that he walks with the use of the [place] crutches.  He has strength in his legs, with spasticity such that his knees lock straight to enable him to walk.  He has strength of 4/5 in the quadriceps, 4/5 in the tibialis anterior, 3/5 in the gastrocs bilaterally.  He has marked stiffness in the major muscle groups of the lower extremities.  In his upper extremities I note that his strength is good in the deltoids, the biceps, triceps, grips.  In the intrinsics he has good strength.  However, he does have some modest thenar eminence atrophy and he has some difficulty with rounding out the fingers, and particularly the distal small finger flexors.    IMAGING STUDIES:                                          Review of his MRIs demonstrates that in the cervical spine he has stenosis without cord signal change at C4-5 and C5-6.  He has diffuse spondylosis at multiple levels in his neck.  In the thoracic spine he has several areas where he has some canal compromise, but the singular worst  level is at T10-T11, where he has a broad-based disk protrusion, in addition to posterior elemental hypertrophy.  He has had some previous decompression here but it appears that he has restenosed his canal at this level now.  I do not have images of lumbar spine, where the patient was previously diagnosed with lumbar spinal stenosis.  A recent MRI demonstrates the presence of significant stenosis at the L5-S1 level. Patient also has other degenerative changes at the thoracolumbar junction however at L5-S1 he has severe central stenosis with lateral recess stenosis. He was advised that this would require surgical decompression  Impression patient has a severe lumbar stenosis with neurogenic claudication and lumbar radiculopathy at L5-S1.  Plan patient is being admitted now to undergo surgical decompression arthrodesis at the L5-S1 level.

## 2015-12-30 NOTE — Anesthesia Postprocedure Evaluation (Signed)
Anesthesia Post Note  Patient: Larry [name]  Procedure(s) Performed: Procedure(s) (LRB): Posterior Lumbar Five-Sacral One Interbody and Fusion (N/A)  Patient location during evaluation: PACU Anesthesia Type: General Level of consciousness: awake and alert Pain management: pain level controlled Vital Signs Assessment: post-procedure vital signs reviewed and stable Respiratory status: spontaneous breathing, nonlabored ventilation, respiratory function stable and patient connected to nasal cannula oxygen Cardiovascular status: blood pressure returned to baseline and stable Postop Assessment: no signs of nausea or vomiting Anesthetic complications: no    Last Vitals:  Vitals:   12/30/15 1930 12/30/15 1951  BP: 136/87 132/86  Pulse: 80 63  Resp: 15 16  Temp: 36.6 C 36.9 C    Last Pain:  Vitals:   12/30/15 1951  TempSrc: Oral                 Ethyle Tiedt,JAMES TERRILL

## 2015-12-30 NOTE — Anesthesia Preprocedure Evaluation (Addendum)
Anesthesia Evaluation  Patient identified by MRN, date of birth, ID band Patient awake    Reviewed: Allergy & Precautions, NPO status , Patient's Chart, lab work & pertinent test results  Airway Mallampati: III  TM Distance: >3 FB Neck ROM: Full    Dental  (+) Poor Dentition, Chipped, Dental Advisory Given,    Pulmonary neg pulmonary ROS,    Pulmonary exam normal        Cardiovascular hypertension, Pt. on medications negative cardio ROS Normal cardiovascular exam     Neuro/Psych PSYCHIATRIC DISORDERS Depression MS 55 Walks with a cane prior to back issues   Neuromuscular disease    GI/Hepatic negative GI ROS, Neg liver ROS,   Endo/Other  negative endocrine ROSdiabetes, Well Controlled, Type 2, Oral Hypoglycemic Agents  Renal/GU   negative genitourinary   Musculoskeletal  (+) Arthritis ,   Abdominal Normal abdominal exam  (+)   Peds negative pediatric ROS (+)  Hematology negative hematology ROS (+)   Anesthesia Other Findings   Reproductive/Obstetrics negative OB ROS                            Anesthesia Physical Anesthesia Plan  ASA: II  Anesthesia Plan: General   Post-op Pain Management:    Induction: Intravenous  Airway Management Planned: Oral ETT  Additional Equipment:   Intra-op Plan:   Post-operative Plan: Extubation in OR  Informed Consent: I have reviewed the patients History and Physical, chart, labs and discussed the procedure including the risks, benefits and alternatives for the proposed anesthesia with the patient or authorized representative who has indicated his/her understanding and acceptance.   Dental advisory given  Plan Discussed with: CRNA and Surgeon  Anesthesia Plan Comments:         Anesthesia Quick Evaluation

## 2015-12-30 NOTE — Progress Notes (Signed)
Patient ID: Larry [name], male   DOB: 1950-11-25, 65 y.o.   MRN: [id number] Vital signs are stable Motor function appears intact Patient has some chronic spasticity This is unchanged

## 2015-12-31 LAB — CBC WITH DIFFERENTIAL/PLATELET
Basophils Absolute: 0 10*3/uL (ref 0.0–0.1)
Basophils Relative: 0 %
Eosinophils Absolute: 0 10*3/uL (ref 0.0–0.7)
Eosinophils Relative: 0 %
HEMATOCRIT: 20.9 % — AB (ref 39.0–52.0)
HEMOGLOBIN: 6.8 g/dL — AB (ref 13.0–17.0)
LYMPHS ABS: 0.7 10*3/uL (ref 0.7–4.0)
LYMPHS PCT: 11 %
MCH: 30.9 pg (ref 26.0–34.0)
MCHC: 32.5 g/dL (ref 30.0–36.0)
MCV: 95 fL (ref 78.0–100.0)
MONOS PCT: 3 %
Monocytes Absolute: 0.2 10*3/uL (ref 0.1–1.0)
NEUTROS ABS: 5.3 10*3/uL (ref 1.7–7.7)
NEUTROS PCT: 85 %
Platelets: 191 10*3/uL (ref 150–400)
RBC: 2.2 MIL/uL — AB (ref 4.22–5.81)
RDW: 14.9 % (ref 11.5–15.5)
WBC: 6.2 10*3/uL (ref 4.0–10.5)

## 2015-12-31 LAB — URINALYSIS, ROUTINE W REFLEX MICROSCOPIC
BILIRUBIN URINE: NEGATIVE
Glucose, UA: NEGATIVE mg/dL
HGB URINE DIPSTICK: NEGATIVE
Ketones, ur: NEGATIVE mg/dL
Leukocytes, UA: NEGATIVE
NITRITE: NEGATIVE
PH: 5 (ref 5.0–8.0)
Protein, ur: NEGATIVE mg/dL
SPECIFIC GRAVITY, URINE: 1.021 (ref 1.005–1.030)

## 2015-12-31 LAB — GLUCOSE, CAPILLARY
GLUCOSE-CAPILLARY: 108 mg/dL — AB (ref 65–99)
GLUCOSE-CAPILLARY: 137 mg/dL — AB (ref 65–99)
Glucose-Capillary: 120 mg/dL — ABNORMAL HIGH (ref 65–99)
Glucose-Capillary: 153 mg/dL — ABNORMAL HIGH (ref 65–99)

## 2015-12-31 MED FILL — Heparin Sodium (Porcine) Inj 1000 Unit/ML: INTRAMUSCULAR | Qty: 30 | Status: AC

## 2015-12-31 MED FILL — Sodium Chloride IV Soln 0.9%: INTRAVENOUS | Qty: 3000 | Status: AC

## 2015-12-31 NOTE — Progress Notes (Signed)
Pt showed temp of 101 F @ 1730. RN administered Tylenol and asked patient if has been able to void. Pt stated still has not voided since removing Foley. RN asked NT to bladder scan pt again. Bladder scan showed 284mL of urine in bladder. NT also rechecked pt's temp and showed 102.6 F. RN contacted MD and left message with call center. Call center stated that MD Cabbell is the MD on call and should conctact within 30 min. Pt appears asymptomatic with no complaints of pain. Pt is also receiving 12mL/hr of normal saline per orders and due to low blood pressure and high heart rate. Pt was made aware of situation. Per orders, RN performed in & out cath PRN to empty patient's bladder and make sure bladder was not retaining more than scanner showed. Pt emptied 257mL during in & out. Pt repositioned and comfortable in bed. Pt was also educated on incentive spirometer prior to 2nd bladder scan and in & out.

## 2015-12-31 NOTE — Care Management Note (Signed)
Case Management Note  Patient Details  Name: Larry [name] MRN: [id number] Date of Birth: 1951/01/17  Subjective/Objective:   Pt underwent:  Posterior Lumbar Five-Sacral One Interbody and Fusion. He is from home alone.                 Action/Plan: Awaiting PT/OT recommendations. CM following for discharge disposition.   Expected Discharge Date:                  Expected Discharge Plan:     In-House Referral:     Discharge planning Services     Post Acute Care Choice:    Choice offered to:     DME Arranged:    DME Agency:     HH Arranged:    HH Agency:     Status of Service:  In process, will continue to follow  If discussed at Long Length of Stay Meetings, dates discussed:    Additional Comments:  Pollie Friar, RN 12/31/2015, 11:02 AM

## 2015-12-31 NOTE — Progress Notes (Signed)
CRITICAL VALUE ALERT  Critical value received:  Hemoglobin 6.8  Date of notification:  12/31/2015  Time of notification:  2130  Critical value read back: Yes  Nurse who received alert: Maily Debarge B. Rulon Eisenmenger, RN  MD notified (1st page):  Dr. Christella Noa  Time of first page:  2230  MD notified (2nd page):  Time of second page:  Responding MD:  Dr. Christella Noa  Time MD responded:  2232  Provider notified of lab results including critical Hgb.  Urinalysis pending at this time. No new orders received.  Patient medicated for pain and resting comfortably.

## 2015-12-31 NOTE — Evaluation (Signed)
Physical Therapy Evaluation Patient Details Name: Larry [name] MRN: [id number] DOB: 07/10/1950 Today's Date: 12/31/2015   History of Present Illness  65 yo male s/p PLIF L 5-S1 PMH: MS, HTN, HCL, DM  Clinical Impression  Pt admitted with the above and presents with deficits below impairing functional mobility.  Pt limited by back pain in standing with RW and +2 max assist. Lives at home alone, but daughter can be available for assistance as needed. PT recommending CIR to address deficits in strength, mobility, balance, gait training, stair training, and safety awareness to achieve mod I for safe return home. Pt to benefit from further education on precautions during functional mobility. Continue acute follow.     Follow Up Recommendations CIR;Supervision/Assistance - 24 hour    Equipment Recommendations  Rolling walker with 5" wheels;3in1 (PT)    Recommendations for Other Services Rehab consult     Precautions / Restrictions Precautions Precautions: Back Precaution Comments: back handout provided and reviewed in detail Required Braces or Orthoses: Spinal Brace Spinal Brace: Lumbar corset;Applied in sitting position Restrictions Weight Bearing Restrictions: No      Mobility  Bed Mobility               General bed mobility comments: in chair on arrival  Transfers Overall transfer level: Needs assistance Equipment used: Rolling walker (2 wheeled) Transfers: Sit to/from Stand (performed 3 times) Sit to Stand: +2 physical assistance;Max assist         General transfer comment: Verbal cues to shift weight anteriorly. Attempted use of canadian crutches but was limited due to back precautions and pain. Able to perform with RW.   Ambulation/Gait             General Gait Details: did not attempt  Stairs            Wheelchair Mobility    Modified Rankin (Stroke Patients Only)       Balance Overall balance assessment: Needs assistance Sitting-balance  support: Feet supported;No upper extremity supported Sitting balance-Leahy Scale: Fair     Standing balance support: Bilateral upper extremity supported;During functional activity Standing balance-Leahy Scale: Zero                               Pertinent Vitals/Pain Pain Assessment: 0-10 Pain Score: 5  Pain Location: back; denied pain at start of treatment, increased with standing to 7, decr to 5 while sitting Pain Descriptors / Indicators: Discomfort;Constant Pain Intervention(s): Premedicated before session;Monitored during session    [place] expects to be discharged to:: Private residence Living Arrangements: Alone Available Help at Discharge: Family;Available PRN/intermittently (daughter lives 15 mins away) Type of Home: House Home Access: Stairs to enter     Home Layout: Two level;Bed/bath upstairs;1/2 bath on main level Home Equipment: Crutches Additional Comments: doing all cooking cleaning and adls. Pt does not drive for last 4 years    Prior Function Level of Independence: Independent with assistive device(s)         Comments: previous back brace use and reports wearing it some PTA and now with new LSO brace      Hand Dominance   Dominant Hand: Right    Extremity/Trunk Assessment   Upper Extremity Assessment: Generalized weakness (tremor present and reports    )           Lower Extremity Assessment: RLE deficits/detail;LLE deficits/detail RLE Deficits / Details: Performed in seated position: 4-/5 knee ext and  DF, 1/5 hamstrings. Pt could not achieve terminal knee extension in standing. LLE Deficits / Details: Performed in seated position: 4-/5 knee ext and DF, 1/5 hamstrings. Pt could not achieve terminal knee extension in standing  Cervical / Trunk Assessment: Other exceptions (s/p surg )  Communication   Communication: No difficulties  Cognition Arousal/Alertness: Awake/alert Behavior During Therapy: WFL for tasks  assessed/performed Overall Cognitive Status: Within Functional Limits for tasks assessed                      General Comments General comments (skin integrity, edema, etc.): dressing dry and intact    Exercises General Exercises - Lower Extremity Ankle Circles/Pumps: AROM;10 reps;Seated      Assessment/Plan    PT Assessment Patient needs continued PT services  PT Diagnosis Difficulty walking;Abnormality of gait;Generalized weakness;Acute pain   PT Problem List Decreased strength;Decreased range of motion;Decreased activity tolerance;Decreased balance;Decreased mobility;Decreased coordination;Decreased knowledge of use of DME;Decreased safety awareness;Decreased knowledge of precautions;Pain  PT Treatment Interventions DME instruction;Gait training;Stair training;Functional mobility training;Therapeutic activities;Therapeutic exercise;Balance training;Neuromuscular re-education;Patient/family education   PT Goals (Current goals can be found in the Care Plan section) Acute Rehab PT Goals Patient Stated Goal: to get back to doing my care PT Goal Formulation: With patient/family Time For Goal Achievement: 01/07/16 Potential to Achieve Goals: Good Additional Goals Additional Goal #1: Pt will don lumbar brace independently in sitting.    Frequency Min 5X/week   Barriers to discharge        Co-evaluation PT/OT/SLP Co-Evaluation/Treatment: Yes Reason for Co-Treatment: Complexity of the patient's impairments (multi-system involvement) PT goals addressed during session: Balance;Proper use of DME;Mobility/safety with mobility         End of Session Equipment Utilized During Treatment: Gait belt Activity Tolerance: Patient limited by pain Patient left: in chair;with call bell/phone within reach;with chair alarm set;with family/visitor present           Time: 1330-1405 PT Time Calculation (min) (ACUTE ONLY): 35 min   Charges:   PT Evaluation $PT Eval Moderate  Complexity: 1 Procedure     PT G Codes:        Larry [name] 01/12/16, 4:23 PM  Tawni Millers, SPT (student physical therapist) Acute Rehabilitation Services [phone]

## 2015-12-31 NOTE — Progress Notes (Signed)
Patient ID: Larry [name], male   DOB: 03/25/1951, 65 y.o.   MRN: [id number] Vital signs are stable Motor function appears to be about the same He is awaiting his braces and crutches so that he can get out of bed Lumbar braces not apparently in the room Will check on its availability Mobilize his tolerated

## 2015-12-31 NOTE — Evaluation (Signed)
Occupational Therapy Evaluation Patient Details Name: Larry [name] MRN: [id number] DOB: Oct 30, 1950 Today's Date: 12/31/2015    History of Present Illness 65 yo male s/p PLIF L 5-S1 PMH: MS   Clinical Impression   Back handout provided and reviewed adls in detail. Pt educated on: clothing between brace, never sleep in brace, set an alarm at night for medication, avoid sitting for long periods of time, correct bed positioning for sleeping, correct sequence for bed mobility, avoiding lifting more than 5 pounds and never wash directly over incision. All education is complete and patient indicates understanding.      Follow Up Recommendations  CIR    Equipment Recommendations  3 in 1 bedside comode;Other (comment) (RW)    Recommendations for Other Services Rehab consult     Precautions / Restrictions Precautions Precautions: Back Precaution Comments: back handout provided and reviewed in detail Required Braces or Orthoses: Spinal Brace Spinal Brace: Lumbar corset;Applied in sitting position Restrictions Weight Bearing Restrictions: (P) No      Mobility Bed Mobility               General bed mobility comments: in chair on arrival  Transfers Overall transfer level: Needs assistance Equipment used: Rolling walker (2 wheeled) Transfers: Sit to/from Stand Sit to Stand: +2 physical assistance;Max assist         General transfer comment: requires (A) to shift weight anteriorly , needs hip for hip extension    Balance Overall balance assessment: Needs assistance         Standing balance support: Bilateral upper extremity supported;During functional activity Standing balance-Leahy Scale: Zero                              ADL                                               Vision     Perception     Praxis      Pertinent Vitals/Pain Pain Assessment: No/denies pain     Hand Dominance Right   Extremity/Trunk Assessment  Upper Extremity Assessment Upper Extremity Assessment: Generalized weakness (tremor present and reports    )   Lower Extremity Assessment Lower Extremity Assessment: (P) RLE deficits/detail;LLE deficits/detail   Cervical / Trunk Assessment Cervical / Trunk Assessment: Other exceptions (s/p surg )   Communication Communication Communication: No difficulties   Cognition Arousal/Alertness: Awake/alert Behavior During Therapy: WFL for tasks assessed/performed Overall Cognitive Status: Within Functional Limits for tasks assessed                     General Comments       Exercises       Shoulder Instructions      Home Living Family/patient expects to be discharged to:: Private residence Living Arrangements: Alone Available Help at Discharge: Family;Available PRN/intermittently (daughter lives 15 mins away) Type of Home: House Home Access: Stairs to enter     Home Layout: Two level;Bed/bath upstairs;1/2 bath on main level Alternate Level Stairs-Number of Steps: 12-15 Alternate Level Stairs-Rails: Right Bathroom Shower/Tub: Chief Strategy Officer: Standard     Home Equipment: Crutches   Additional Comments: doing all cooking cleaning and adls. Pt does not drive for last 4 years      Prior Functioning/Environment Level of Independence: Independent  with assistive device(s)        Comments: previous back brace use and reports wearing it some PTA and now with new LSO brace     OT Diagnosis: Acute pain;Generalized weakness   OT Problem List: Pain;Decreased strength;Decreased activity tolerance;Impaired balance (sitting and/or standing);Decreased safety awareness;Decreased knowledge of use of DME or AE;Decreased knowledge of precautions   OT Treatment/Interventions: Self-care/ADL training;DME and/or AE instruction;Therapeutic activities;Patient/family education;Balance training;Therapeutic exercise    OT Goals(Current goals can be found in the care  plan section) Acute Rehab OT Goals Patient Stated Goal: to get back to doing my care OT Goal Formulation: With patient Time For Goal Achievement: 01/14/16 Potential to Achieve Goals: Good ADL Goals Pt Will Perform Grooming: with supervision;sitting Pt Will Perform Lower Body Bathing: with supervision;with adaptive equipment;sit to/from stand Pt Will Perform Lower Body Dressing: with supervision;with adaptive equipment;sit to/from stand Pt Will Transfer to Toilet: with supervision;bedside commode Additional ADL Goal #1: Pt will complete bed mobility supervision level as precursor to adls  OT Frequency: Min 2X/week   Barriers to D/C:            Co-evaluation   Reason for Co-Treatment: (P) Complexity of the patient's impairments (multi-system involvement) PT goals addressed during session: (P) Balance;Proper use of DME;Mobility/safety with mobility        End of Session Equipment Utilized During Treatment: Gait belt;Rolling walker Nurse Communication: Mobility status;Precautions  Activity Tolerance: Patient tolerated treatment well Patient left: in chair;with call bell/phone within reach;with chair alarm set   Time: 1323-1400 OT Time Calculation (min): 37 min Charges:  OT General Charges $OT Visit: 1 Procedure OT Evaluation $OT Eval Moderate Complexity: 1 Procedure G-Codes:    Peri Maris 01/17/2016, 2:21 PM   Jeri Modena   OTR/L Pager: [phone] Office: [phone] .

## 2015-12-31 NOTE — Progress Notes (Signed)
Rehab Admissions Coordinator Note:  Patient was screened by Cleatrice Burke for appropriateness for an Inpatient Acute Rehab Consult per PT and OT recommendations.   At this time, we are recommending Inpatient Rehab consult. Please place order for consult.  Cleatrice Burke 12/31/2015, 7:01 PM  I can be reached at [phone].

## 2015-12-31 NOTE — Progress Notes (Signed)
PT Cancellation Note  Patient Details Name: Larry [name] MRN: [id number] DOB: 10/07/1950   Cancelled Treatment:    Reason Eval/Treat Not Completed: Other (comment) (Waiting for lumbar brace). PT to return as able.   Moshe Salisbury Collier Monica 12/31/2015, 11:28 AM  Tawni Millers, SPT (student physical therapist) Acute Rehabilitation Services [phone]

## 2015-12-31 NOTE — Care Management Important Message (Signed)
Important Message  Patient Details  Name: Joshuadavid Arritt MRN: [id number] Date of Birth: June 17, 1950   Medicare Important Message Given:  Yes    [name] 12/31/2015, 8:13 AM

## 2015-12-31 NOTE — Progress Notes (Signed)
Provider notified for continued elevated temperature and urinary retention unresolved.  Foley catheter removed earlier today with patient due to void following I/O catherization.  Temp range between 101 and 102.8 following interventions.  After hours service contacted for callback from on-call physician, Dr. Cyndy Freeze. Patient alert and oriented in NAD.  Denies any distress or discomfort at this time.  Awaiting return call.

## 2015-12-31 NOTE — Progress Notes (Signed)
Orthopedic Tech Progress Note Patient Details:  Larry [name] Dec 22, 1950 [id number]  Patient ID: Larry [name], male   DOB: [birth year], 65 y.o.   MRN: [id number]   [name] 12/31/2015, 10:57 AM Called in [employer] brace order; spoke with Junie Panning

## 2015-12-31 NOTE — Progress Notes (Signed)
Callback received from Dr. Christella Noa.  New orders received and initiated.

## 2015-12-31 NOTE — Progress Notes (Signed)
OT Cancellation Note  Patient Details Name: Larry [name] MRN: [id number] DOB: March 08, 1951   Cancelled Treatment:    Reason Eval/Treat Not Completed: Other (comment) (awaiting brace delivery) Called [employer] and confirmed pending brace delivery  Vonita Moss   OTR/L Pager: [phone] Office: [phone] .  12/31/2015, 11:53 AM

## 2016-01-01 DIAGNOSIS — M4316 Spondylolisthesis, lumbar region: Secondary | ICD-10-CM

## 2016-01-01 DIAGNOSIS — R000 Tachycardia, unspecified: Secondary | ICD-10-CM

## 2016-01-01 DIAGNOSIS — D62 Acute posthemorrhagic anemia: Secondary | ICD-10-CM

## 2016-01-01 DIAGNOSIS — R7303 Prediabetes: Secondary | ICD-10-CM

## 2016-01-01 DIAGNOSIS — R5082 Postprocedural fever: Secondary | ICD-10-CM

## 2016-01-01 DIAGNOSIS — E119 Type 2 diabetes mellitus without complications: Secondary | ICD-10-CM

## 2016-01-01 DIAGNOSIS — M4806 Spinal stenosis, lumbar region: Principal | ICD-10-CM

## 2016-01-01 DIAGNOSIS — G8918 Other acute postprocedural pain: Secondary | ICD-10-CM

## 2016-01-01 DIAGNOSIS — N179 Acute kidney failure, unspecified: Secondary | ICD-10-CM

## 2016-01-01 DIAGNOSIS — G629 Polyneuropathy, unspecified: Secondary | ICD-10-CM

## 2016-01-01 DIAGNOSIS — R339 Retention of urine, unspecified: Secondary | ICD-10-CM

## 2016-01-01 DIAGNOSIS — G35 Multiple sclerosis: Secondary | ICD-10-CM

## 2016-01-01 DIAGNOSIS — R509 Fever, unspecified: Secondary | ICD-10-CM

## 2016-01-01 DIAGNOSIS — Z419 Encounter for procedure for purposes other than remedying health state, unspecified: Secondary | ICD-10-CM

## 2016-01-01 DIAGNOSIS — I10 Essential (primary) hypertension: Secondary | ICD-10-CM

## 2016-01-01 LAB — GLUCOSE, CAPILLARY
GLUCOSE-CAPILLARY: 166 mg/dL — AB (ref 65–99)
Glucose-Capillary: 150 mg/dL — ABNORMAL HIGH (ref 65–99)
Glucose-Capillary: 135 mg/dL — ABNORMAL HIGH (ref 65–99)
Glucose-Capillary: 204 mg/dL — ABNORMAL HIGH (ref 65–99)
Glucose-Capillary: 135 mg/dL — ABNORMAL HIGH (ref 65–99)

## 2016-01-01 LAB — CBC WITH DIFFERENTIAL/PLATELET
BASOS ABS: 0 10*3/uL (ref 0.0–0.1)
Basophils Relative: 0 %
EOS ABS: 0.1 10*3/uL (ref 0.0–0.7)
Eosinophils Relative: 1 %
HEMATOCRIT: 20.7 % — AB (ref 39.0–52.0)
Hemoglobin: 6.8 g/dL — CL (ref 13.0–17.0)
LYMPHS PCT: 6 %
Lymphs Abs: 0.5 10*3/uL — ABNORMAL LOW (ref 0.7–4.0)
MCH: 31.9 pg (ref 26.0–34.0)
MCHC: 32.9 g/dL (ref 30.0–36.0)
MCV: 97.2 fL (ref 78.0–100.0)
MONOS PCT: 2 %
Monocytes Absolute: 0.2 10*3/uL (ref 0.1–1.0)
NEUTROS PCT: 91 %
Neutro Abs: 6.9 10*3/uL (ref 1.7–7.7)
Platelets: 189 10*3/uL (ref 150–400)
RBC: 2.13 MIL/uL — AB (ref 4.22–5.81)
RDW: 14.9 % (ref 11.5–15.5)
WBC: 7.7 10*3/uL (ref 4.0–10.5)

## 2016-01-01 LAB — COMPREHENSIVE METABOLIC PANEL
ALBUMIN: 3.4 g/dL — AB (ref 3.5–5.0)
ALT: 16 U/L — AB (ref 17–63)
AST: 36 U/L (ref 15–41)
Alkaline Phosphatase: 40 U/L (ref 38–126)
Anion gap: 6 (ref 5–15)
BILIRUBIN TOTAL: 0.6 mg/dL (ref 0.3–1.2)
BUN: 20 mg/dL (ref 6–20)
CHLORIDE: 106 mmol/L (ref 101–111)
CO2: 25 mmol/L (ref 22–32)
CREATININE: 1.23 mg/dL (ref 0.61–1.24)
Calcium: 8.4 mg/dL — ABNORMAL LOW (ref 8.9–10.3)
GFR calc Af Amer: >60 mL/min (ref >60)
GFR, EST NON AFRICAN AMERICAN: 59 mL/min — AB (ref >60)
GLUCOSE: 204 mg/dL — AB (ref 65–99)
Potassium: 3.8 mmol/L (ref 3.5–5.1)
Sodium: 137 mmol/L (ref 135–145)
TOTAL PROTEIN: 5.6 g/dL — AB (ref 6.5–8.1)

## 2016-01-01 LAB — URINALYSIS, ROUTINE W REFLEX MICROSCOPIC
BILIRUBIN URINE: NEGATIVE
GLUCOSE, UA: NEGATIVE mg/dL
HGB URINE DIPSTICK: NEGATIVE
Ketones, ur: NEGATIVE mg/dL
Leukocytes, UA: NEGATIVE
Nitrite: NEGATIVE
Protein, ur: NEGATIVE mg/dL
SPECIFIC GRAVITY, URINE: 1.018 (ref 1.005–1.030)
pH: 5 (ref 5.0–8.0)

## 2016-01-01 LAB — RETICULOCYTES
RBC.: 2.15 MIL/uL — AB (ref 4.22–5.81)
RETIC CT PCT: 1.6 % (ref 0.4–3.1)
Retic Count, Absolute: 34.4 10*3/uL (ref 19.0–186.0)

## 2016-01-01 LAB — VITAMIN B12: VITAMIN B 12: 748 pg/mL (ref 180–914)

## 2016-01-01 LAB — FOLATE: Folate: 19 ng/mL (ref >5.9)

## 2016-01-01 LAB — IRON AND TIBC
Iron: 8 ug/dL — ABNORMAL LOW (ref 45–182)
SATURATION RATIOS: 4 % — AB (ref 17.9–39.5)
TIBC: 224 ug/dL — AB (ref 250–450)
UIBC: 216 ug/dL

## 2016-01-01 LAB — PREPARE RBC (CROSSMATCH)

## 2016-01-01 LAB — FERRITIN: FERRITIN: 76 ng/mL (ref 24–336)

## 2016-01-01 MED ORDER — SODIUM CHLORIDE 0.9 % IV SOLN: Freq: Once | INTRAVENOUS | Status: DC

## 2016-01-01 NOTE — Progress Notes (Signed)
Following continued efforts to trigger urination, patient now voiding small amounts.  External urinary catheter placed to avoid incontinence-associated dermatitis during decreased mobility.  Patient verbalized understanding and agreement with plan.

## 2016-01-01 NOTE — Consult Note (Signed)
Physical Medicine and Rehabilitation Consult Reason for Consult: Lumbar stenosis/HNP/spondylolisthesis Referring Physician: Dr. Ellene Route   HPI: Larry [name] is a 65 y.o. right handed male with history of hypertension, diabetes mellitus, multiple sclerosis diagnosed in 1996 followed by [facility] neurology, and history of thoracic lumbar decompressive surgery. As per chart review patient lives alone. He has a daughter that lives 15 minutes away. He was doing all his cooking and cleaning as well as ADLs prior to admission. He has not drove for the last 4 years. He uses forearm crutches for ambulation. 2 level home with bedroom upstairs. Presented 12/30/2015 with progressive weakness lower extremities. X-rays and imaging revealed herniated nucleus pulposus with severe lumbar stenosis, spondylolisthesis, lumbar stenosis L4-5, L5-S1. Underwent L4-5 decompressive laminectomy decompression of no fruits posterior lumbar interbody arthrodesis with peek spacers as well as posterior lateral arthrodesis 12/30/2015 per Dr. Ellene Route. Hospital course pain management. Lumbar corset applied in sitting position. Bouts of urinary retention. Physical and occupational therapy evaluations completed with recommendations of physical medicine rehabilitation consult.   Review of Systems  Constitutional: Negative for chills and fever.  HENT: Negative for hearing loss.   Eyes: Negative for blurred vision and double vision.  Respiratory: Negative for cough and shortness of breath.   Cardiovascular: Negative for chest pain, palpitations and leg swelling.  Gastrointestinal: Positive for constipation.  Genitourinary: Positive for frequency and urgency.  Musculoskeletal: Positive for joint pain.  Skin: Negative.   Neurological: Positive for focal weakness and weakness. Negative for sensory change, speech change, seizures and headaches.  All other systems reviewed and are negative.  Past Medical History:  Diagnosis Date    . Abnormality of gait   . Acute delirium 12/11/2011  . CTS (carpal tunnel syndrome) 09/26/2015  . Diabetes mellitus   . Hypercholesterolemia   . Hypertension   . Kidney stones   . Leukocytopenia 05/22/2013  . MS (multiple sclerosis) [facility])    sees [facility]  . 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 BACK SURGERY    . COLONOSCOPY    . LUMBAR LAMINECTOMY     Family History  Problem Relation Age of Onset  . Hypertension Mother   . Liver cancer Father   . Hypertension Sister    Social History:  reports that he has never smoked. He has never used smokeless tobacco. He reports that he does not drink alcohol or use drugs. Allergies:  Allergies  Allergen Reactions  . No Known Allergies    Medications Prior to Admission  Medication Sig Dispense Refill  . amitriptyline (ELAVIL) 25 MG tablet Take 1 tablet by mouth at bedtime.    Marland Kitchen aspirin 81 MG tablet Take 81 mg by mouth daily.    . baclofen (LIORESAL) 10 MG tablet Take 2 tablets (20 mg total) by mouth 3 (three) times daily. (Patient taking differently: Take 20-40 mg by mouth See admin instructions. PT takes 20 mg in the morning, 40 mg at bedtime) 180 tablet 11  . cholecalciferol (VITAMIN D) 1000 UNITS tablet Take 1,000 Units by mouth daily.    . cloNIDine (CATAPRES) 0.1 MG tablet Take 1 tablet by mouth two  times daily 180 tablet 1  . Cyanocobalamin (VITAMIN B-12 PO) Take 1 tablet by mouth 2 (two) times daily. Patient is unsure of dose    . diclofenac (VOLTAREN) 75 MG EC tablet Take 75 mg by mouth  as needed.    . gabapentin (NEURONTIN) 800 MG tablet Take 1,600 mg by mouth 2 (two) times daily. Pt takes 2 tablets in the morning, 2 tablets at bedtime    . Interferon Beta-1a (AVONEX PEN) 30 MCG/0.5ML AJKT Inject 30 mcg into the muscle once a week. (Patient taking differently: Inject 10 mcg into the muscle once a week. ) 3 each  0  . meloxicam (MOBIC) 7.5 MG tablet Take 1 tablet (7.5 mg total) by mouth 2 (two) times daily. 60 tablet 6  . metFORMIN (GLUCOPHAGE) 1000 MG tablet Take 1 tablet by mouth two  times daily with meals 180 tablet 1  . Multiple Vitamin (MULTIVITAMIN) tablet Take 1 tablet by mouth daily.    . Olmesartan-Amlodipine-HCTZ (TRIBENZOR) 40-10-12.5 MG TABS Take 1 tablet by mouth  daily 90 tablet 3  . pioglitazone (ACTOS) 30 MG tablet Take 1 tablet by mouth  every day 90 tablet 1  . traMADol (ULTRAM) 50 MG tablet Take 1 tablet (50 mg total) by mouth at bedtime as needed for severe pain. for pain 30 tablet 5  . traZODone (DESYREL) 50 MG tablet Take 1 tablet (50 mg total) by mouth at bedtime. 30 tablet 6  . VESICARE 10 MG tablet Take 1 tablet by mouth  daily 90 tablet 1  . ACCU-CHEK AVIVA PLUS test strip Test once daily 100 each 3  . Blood Glucose Calibration (ACCU-CHEK AVIVA) SOLN     . Lancets (ACCU-CHEK MULTICLIX) lancets Use to check blood glucose  once daily 102 each 3  . naproxen sodium (ALEVE) 220 MG tablet Take 440 mg by mouth daily as needed.    Marland Kitchen omeprazole (PRILOSEC) 20 MG capsule Take 1 capsule (20 mg total) by mouth daily. 30 capsule 3    Home: Home Living Family/patient expects to be discharged to:: Private residence Living Arrangements: Alone Available Help at Discharge: Family, Available PRN/intermittently (daughter lives 15 mins away) Type of Home: House Home Access: Stairs to enter Home Layout: Two level, Bed/bath upstairs, 1/2 bath on main level Alternate Level Stairs-Number of Steps: 12-15 Alternate Level Stairs-Rails: Right Bathroom Shower/Tub: Chiropodist: Standard Home Equipment: Crutches Additional Comments: doing all cooking cleaning and adls. Pt does not drive for last 4 years  Functional History: Prior Function Level of Independence: Independent with assistive device(s) Comments: previous back brace use and reports wearing it some PTA and now with new  LSO brace  Functional Status:  Mobility: Bed Mobility General bed mobility comments: in chair on arrival Transfers Overall transfer level: Needs assistance Equipment used: Rolling walker (2 wheeled) Transfers: Sit to/from Stand (performed 3 times) Sit to Stand: +2 physical assistance, Max assist General transfer comment: Verbal cues to shift weight anteriorly. Attempted use of canadian crutches but was limited due to back precautions and pain. Able to perform with RW.  Ambulation/Gait General Gait Details: did not attempt    ADL:    Cognition: Cognition Overall Cognitive Status: Within Functional Limits for tasks assessed Orientation Level: Oriented X4 Cognition Arousal/Alertness: Awake/alert Behavior During Therapy: WFL for tasks assessed/performed Overall Cognitive Status: Within Functional Limits for tasks assessed  Blood pressure 109/62, pulse 88, temperature 98.6 F (37 C), temperature source Oral, resp. rate 18, height 5\' 7"  (1.702 m), weight 76.5 kg (168 lb 11.2 oz), SpO2 100 %. Physical Exam  Vitals reviewed. Constitutional: He is oriented to person, place, and time. He appears well-developed and well-nourished.  HENT:  Head: Normocephalic and atraumatic.  Eyes: Conjunctivae and EOM are normal.  Neck: Normal range of motion. Neck supple. No thyromegaly present.  Cardiovascular: Regular rhythm.   Tachycardia  Respiratory: Effort normal and breath sounds normal. No respiratory distress.  GI: Soft. Bowel sounds are normal. He exhibits no distension.  Musculoskeletal: He exhibits no edema or tenderness.  Atrophy b/l LE  Neurological: He is alert and oriented to person, place, and time.  Sensation intact to light touch DTRs 3+ b/l LE Motor: B/l UE 5/5 proximal to distal LLE: Hip flexion 2+/5, knee extension 5/5, ankle dorsi/plantar flexion 4/5 RLE: Hip flexion 2/5, knee extension 4+/5, ankle dorsi/plantar flexion 3/5  Skin: Skin is warm and dry.  Back incision  dressed  Psychiatric: He has a normal mood and affect. His behavior is normal. Thought content normal.    Results for orders placed or performed during the hospital encounter of 12/30/15 (from the past 24 hour(s))  Glucose, capillary     Status: Abnormal   Collection Time: 12/31/15 11:06 AM  Result Value Ref Range   Glucose-Capillary 108 (H) 65 - 99 mg/dL  Glucose, capillary     Status: Abnormal   Collection Time: 12/31/15  4:09 PM  Result Value Ref Range   Glucose-Capillary 137 (H) 65 - 99 mg/dL  Glucose, capillary     Status: Abnormal   Collection Time: 12/31/15  8:34 PM  Result Value Ref Range   Glucose-Capillary 150 (H) 65 - 99 mg/dL   Comment 1 Notify RN    Comment 2 Document in Chart   CBC with Differential/Platelet     Status: Abnormal   Collection Time: 12/31/15  9:00 PM  Result Value Ref Range   WBC 6.2 4.0 - 10.5 K/uL   RBC 2.20 (L) 4.22 - 5.81 MIL/uL   Hemoglobin 6.8 (LL) 13.0 - 17.0 g/dL   HCT 20.9 (L) 39.0 - 52.0 %   MCV 95.0 78.0 - 100.0 fL   MCH 30.9 26.0 - 34.0 pg   MCHC 32.5 30.0 - 36.0 g/dL   RDW 14.9 11.5 - 15.5 %   Platelets 191 150 - 400 K/uL   Neutrophils Relative % 85 %   Neutro Abs 5.3 1.7 - 7.7 K/uL   Lymphocytes Relative 11 %   Lymphs Abs 0.7 0.7 - 4.0 K/uL   Monocytes Relative 3 %   Monocytes Absolute 0.2 0.1 - 1.0 K/uL   Eosinophils Relative 0 %   Eosinophils Absolute 0.0 0.0 - 0.7 K/uL   Basophils Relative 0 %   Basophils Absolute 0.0 0.0 - 0.1 K/uL  Urinalysis, Routine w reflex microscopic (not at [facility])     Status: None   Collection Time: 12/31/15  9:45 PM  Result Value Ref Range   Color, Urine YELLOW YELLOW   APPearance CLEAR CLEAR   Specific Gravity, Urine 1.021 1.005 - 1.030   pH 5.0 5.0 - 8.0   Glucose, UA NEGATIVE NEGATIVE mg/dL   Hgb urine dipstick NEGATIVE NEGATIVE   Bilirubin Urine NEGATIVE NEGATIVE   Ketones, ur NEGATIVE NEGATIVE mg/dL   Protein, ur NEGATIVE NEGATIVE mg/dL   Nitrite NEGATIVE NEGATIVE   Leukocytes, UA  NEGATIVE NEGATIVE  Glucose, capillary     Status: Abnormal   Collection Time: 01/01/16  6:25 AM  Result Value Ref Range   Glucose-Capillary 135 (H) 65 - 99 mg/dL   Comment 1 Notify RN    Comment 2 Document in Chart    Dg Lumbar Spine 2-3 Views  Result Date: 12/30/2015 CLINICAL DATA:  Lumbar fusion EXAM: LUMBAR SPINE - 2-3 VIEW;  DG C-ARM 61-120 MIN COMPARISON:  Intraoperative images 7 07/01/2015, MRI lumbar spine 08/26/2015 FLUOROSCOPY TIME:  0 minutes 32 seconds Images obtained: 2 FINDINGS: Prior MR lumbar spine labeled demonstrating L5-S1 fusion. Preceding intraoperative images were labeled similarly for consistency. Current intraoperative C-arm images labeled similarly as well for consistency. Two digital C-arm fluoroscopic images demonstrate placement of BILATERAL pedicle screws and bars at what is labeled as L4-L5 on the prior MR. Intervening disc prosthesis at L4-L5 disc space. IMPRESSION: Lower lumbar fusion, labeled as L4-L5 to match the numbering system utilized on most recent MRI lumbar spine as discussed above. Electronically Signed   By: Lavonia Dana M.D.   On: 12/30/2015 18:32   Dg Lumbar Spine 2-3 Views  Result Date: 12/30/2015 CLINICAL DATA:  L5-S1 PLIF EXAM: LUMBAR SPINE - 2-3 VIEW COMPARISON:  Two portable intraoperative lateral views of the lumbar spine are compared to prior MRI lumbar spine 08/26/2015 FINDINGS: Prior MRI lumbar spine is labeled with incorporation of the L5 vertebral body with S1 and last open disc space as the L4-L5. Current radiographs are labeled similar to the prior MR for consistency. Recommend correlation of this numbering system with that used on any additional prior imaging. Image #1 at 1538 hours: L5-S1 fusion. Vacuum phenomenon L4-L5. Metallic probe via dorsal approach projects dorsal to what is labeled the mid L3 level. Image #2 at 1548 hours: L5-S1 fusion. Curved metallic probe via dorsal approach projects dorsal to what is labeled the L4-L5 disc space.  IMPRESSION: Numbering of lumbar spine to match MR numbering system as above - please note lumbar numbering as discussed above and as discussed in report of prior MR lumbar spine. Intraoperative lumbar localization images as above. Electronically Signed   By: Lavonia Dana M.D.   On: 12/30/2015 18:27   Dg C-arm 1-60 Min  Result Date: 12/30/2015 CLINICAL DATA:  Lumbar fusion EXAM: LUMBAR SPINE - 2-3 VIEW; DG C-ARM 61-120 MIN COMPARISON:  Intraoperative images 7 07/01/2015, MRI lumbar spine 08/26/2015 FLUOROSCOPY TIME:  0 minutes 32 seconds Images obtained: 2 FINDINGS: Prior MR lumbar spine labeled demonstrating L5-S1 fusion. Preceding intraoperative images were labeled similarly for consistency. Current intraoperative C-arm images labeled similarly as well for consistency. Two digital C-arm fluoroscopic images demonstrate placement of BILATERAL pedicle screws and bars at what is labeled as L4-L5 on the prior MR. Intervening disc prosthesis at L4-L5 disc space. IMPRESSION: Lower lumbar fusion, labeled as L4-L5 to match the numbering system utilized on most recent MRI lumbar spine as discussed above. Electronically Signed   By: Lavonia Dana M.D.   On: 12/30/2015 18:32    Assessment/Plan: Diagnosis: Lumbar stenosis/HNP/spondylolisthesis Labs and images independently reviewed.  Records reviewed and summated above.  1. Does the need for close, 24 hr/day medical supervision in concert with the patient's rehab needs make it unreasonable for this patient to be served in a less intensive setting? Yes  2. Co-Morbidities requiring supervision/potential complications: HTN (monitor and provide prns in accordance with increased physical exertion and pain), diabetes mellitus (Monitor in accordance with exercise and adjust meds as necessary), multiple sclerosis (cont meds), history of thoracic lumbar decompressive surgery, post-op pain management (Biofeedback training with therapies to help reduce reliance on opiate pain  medications, monitor pain control during therapies, and sedation at rest and titrate to maximum efficacy to ensure participation and gains in therapies), urinary retention (cont to monitor, meds), Tachycardia (monitor in accordance with pain and increasing activity), pyrexia (cont to monitor), ABLA (transfuse if necessary to ensure appropriate perfusion  for increased activity tolerance), AKI (avoid nephrotoxic meds), Prediabetes (Monitor in accordance with exercise and adjust meds as necessary) 3. Due to bladder management, safety, skin/wound care, disease management, pain management and patient education, does the patient require 24 hr/day rehab nursing? Yes 4. Does the patient require coordinated care of a physician, rehab nurse, PT (1-2 hrs/day, 5 days/week) and OT (1-2 hrs/day, 5 days/week) to address physical and functional deficits in the context of the above medical diagnosis(es)? Yes Addressing deficits in the following areas: balance, endurance, locomotion, strength, transferring, bowel/bladder control, toileting and psychosocial support 5. Can the patient actively participate in an intensive therapy program of at least 3 hrs of therapy per day at least 5 days per week? Yes 6. The potential for patient to make measurable gains while on inpatient rehab is excellent 7. Anticipated functional outcomes upon discharge from inpatient rehab are min assist and mod assist  with PT, min assist and mod assist with OT, n/a with SLP. 8. Estimated rehab length of stay to reach the above functional goals is: 17-20 days. 9. Does the patient have adequate social supports and living environment to accommodate these discharge functional goals? Potentially 10. Anticipated D/C setting: Home 11. Anticipated post D/C treatments: HH therapy and Home excercise program 12. Overall Rehab/Functional Prognosis: good  RECOMMENDATIONS: This patient's condition is appropriate for continued rehabilitative care in the  following setting: CIR once pt medically stable if caregiver support available. Patient has agreed to participate in recommended program. Yes Note that insurance prior authorization may be required for reimbursement for recommended care.  Comment: Rehab Admissions Coordinator to follow up.  Delice Lesch, MD 01/01/2016

## 2016-01-01 NOTE — Progress Notes (Signed)
Patient ID: Larry [name], male   DOB: February 09, 1951, 65 y.o.   MRN: [id number] Vital signs are stable however patient's temperature was 1026 last night He feels generalized weakness and this may be due to his last reported hemoglobin of 6.7 which is low I'll repeat a CBC and a cemented today to look at multiple parameters Mask hospitalist to see regarding medical issues Dressing is clean and dry will hold off on further workup for fever but observed temperature which is normalized now He will likely need comprehensive inpatient rehabilitation Consult has been placed

## 2016-01-01 NOTE — Progress Notes (Signed)
Physical Therapy Treatment Patient Details Name: Larry [name] MRN: [id number] DOB: 06/17/1950 Today's Date: 01/01/2016    History of Present Illness 65 yo male s/p PLIF L 5-S1 PMH: MS, HTN, HCL, DM    PT Comments    Patient tolerated sit to stands X2 and stand pivot X1 with max A +2. Pt continues to have difficulty with knee extension and achieving upright posture. Current plan remains appropriate.   Follow Up Recommendations  CIR;Supervision/Assistance - 24 hour     Equipment Recommendations  Rolling walker with 5" wheels;3in1 (PT)    Recommendations for Other Services Rehab consult     Precautions / Restrictions Precautions Precautions: Back Precaution Comments: pt able to recall precautions beginning of session Required Braces or Orthoses: Spinal Brace Spinal Brace: Lumbar corset;Applied in sitting position Restrictions Weight Bearing Restrictions: No    Mobility  Bed Mobility Overal bed mobility: Needs Assistance Bed Mobility: Rolling;Sidelying to Sit Rolling: Mod assist Sidelying to sit: Mod assist       General bed mobility comments: cues for sequencing and technique; assist to bend knees, advance bilat LE to EOB, and elevate trunk into sitting  Transfers Overall transfer level: Needs assistance Equipment used: Rolling walker (2 wheeled) Transfers: Sit to/from [facility] Sit to Stand: Max assist;+2 physical assistance Stand pivot transfers: Max assist;+2 physical assistance       General transfer comment: cues for hand placement and safe technique; sit to stand from EOB X2 with RW and stand pivot to chair X1 with no AD and HHA +2 using gait belt and bed pad; pt unable to achieve upright posture and maintained bilat knee flexion; knees blocked and assist to pivot feet  Ambulation/Gait                 Stairs            Wheelchair Mobility    Modified Rankin (Stroke Patients Only)       Balance     Sitting  balance-Leahy Scale: Fair       Standing balance-Leahy Scale: Zero                      Cognition Arousal/Alertness: Awake/alert Behavior During Therapy: WFL for tasks assessed/performed Overall Cognitive Status: Within Functional Limits for tasks assessed                      Exercises      General Comments        Pertinent Vitals/Pain Pain Assessment: 0-10 Pain Score: 8  Pain Location: back (with transitional movements) Pain Descriptors / Indicators: Grimacing;Guarding;Sore Pain Intervention(s): Limited activity within patient's tolerance;Monitored during session;Repositioned;RN gave pain meds during session    Home Living                      Prior Function            PT Goals (current goals can now be found in the care plan section) Acute Rehab PT Goals Patient Stated Goal: walk again PT Goal Formulation: With patient/family Time For Goal Achievement: 01/07/16 Potential to Achieve Goals: Good Progress towards PT goals: Progressing toward goals    Frequency  Min 5X/week    PT Plan Current plan remains appropriate    Co-evaluation             End of Session Equipment Utilized During Treatment: Gait belt Activity Tolerance: Patient limited by pain Patient left: in chair;with call  bell/phone within reach;with chair alarm set     Time: [phone] PT Time Calculation (min) (ACUTE ONLY): 35 min  Charges:  $Therapeutic Activity: 23-37 mins                    G Codes:      Salina April, PTA Pager: [phone]   01/01/2016, 9:53 AM

## 2016-01-01 NOTE — Progress Notes (Signed)
I met with pt at bedside and then contacted his daughter by phone to discuss his rehab venue options. Pt lives alone and daughter lives here in [place]. She will begin attending Grad school at [employer] 8/21 and will not be available 3 days per week. Pt and his daughter prefer an inpt rehab admission and will try to arrange some intermittent assist when he goes home. I will begin insurance preauthorization for a possible inpt rehab admission when medically ready. They are both aware that SNF rehab will also have to be sought in case insurance does not approve an admission. [phone]

## 2016-01-01 NOTE — Consult Note (Signed)
Medical Consultation   [name]  [id number]  DOB: 30-Nov-1950  DOA: 12/30/2015  PCP: Lucretia Kern., DO   Requesting physician:   Kristeen Miss, MD   Reason for consultation: fever   History of Present Illness: Larry [name] is an 65 y.o. male medical history of multiple sclerosis. Patient has had long-standing cervical and thoracic spine problems. He is status post decompressive surgery for lumbar spinal stenosis without significant improvement. Recent MRI revealed significant stenosis at  L5-S1. Patient underwent surgical decompression 12/30/15. Yesterday evening patient noted to have temp 101-102.8. This am temp 100. Patient denies any coughing or dysuria. His only pain is related to back surgery. Patient has had some problems urinary since foley removed evening of 12/30/15. Sounds like he had a condom cath placed yesterday for unclear reasons. He did void approx 300 ml on evening shift last night. Condom catheter was removed this morning, patient has not voided since. He has a sense of urinary urgency but no dysuria. At home patient gives a history of chronic urinary hesitancy and weak stream. Patient does not know if he's ever been diagnosed with an enlarged prostate.   Patient has had a significant drop in his hemoglobin from baseline of around 10.3 to 6.8. No chest pain or SOB but he does admit to weakness.   Review of Systems:  ROS As per HPI otherwise 10 point review of systems negative.   Past Medical History: Past Medical History:  Diagnosis Date  . Abnormality of gait   . Acute delirium 12/11/2011  . CTS (carpal tunnel syndrome) 09/26/2015  . Diabetes mellitus   . Hypercholesterolemia   . Hypertension   . Kidney stones   . Leukocytopenia 05/22/2013  . MS (multiple sclerosis) [facility])    sees [facility]  . 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 BACK SURGERY    . COLONOSCOPY    . LUMBAR LAMINECTOMY     Allergies:   Allergies  Allergen Reactions  . No Known Allergies     Social History:  reports that he has never smoked. He has never used smokeless tobacco. He reports that he does not drink alcohol or use drugs. Patient lives alone at home. He uses specialized crutches for ambulation with history of multiple sclerosis  Family History: Family History  Problem Relation Age of Onset  . Hypertension Mother   . Liver cancer Father   . Hypertension Sister    Physical Exam: Vitals:   12/31/15 2123 01/01/16 0103 01/01/16 0544 01/01/16 0954  BP: (!) 128/56 (!) 124/53 109/62 116/62  Pulse: (!) 106 (!) 101 88 (!) 101  Resp: 18 18 18 18   Temp: (!) 102.5 F (39.2 C) (!) 100.8 F (38.2 C) 98.6 F (37 C) 100 F (37.8 C)  TempSrc: Oral Oral Oral Oral  SpO2: 98% 99% 100% 98%  Weight:      Height:        Constitutional: Pleasant, relatively healthy-appearing black male in no acute distress . Eyes: PERLA,  irises appear normal, anicteric sclera,  ENMT: external ears and nose appear normal, normal hearing,  Lips appears normal, oropharynx mucosa, tongue, posterior pharynx appear normal  Neck: neck appears normal, no masses, normal ROM, no thyromegaly, no JVD  CVS: S1-S2 clear, no murmur rubs  or gallops, no LE edema, normal pedal pulses  Respiratory:  clear to auscultation bilaterally, no wheezing, rales or rhonchi. Respiratory effort normal. No accessory muscle use.  Abdomen: soft nontender, nondistended, normal bowel sounds, no hepatosplenomegaly, no hernias  Musculoskeletal: : no cyanosis, clubbing or edema noted bilaterally. BLE with muscle wasting.  Neuro: Cranial nerves II-XII intact, strength, sensation, reflexes Psych: judgement and insight appear normal, stable mood and affect, mental status Skin: no rashes or lesions or ulcers, no induration or nodules   Data reviewed:  I have  personally reviewed following labs and imaging studies Labs:  CBC:  Recent Labs Lab 12/31/15 2100  WBC 6.2  NEUTROABS 5.3  HGB 6.8*  HCT 20.9*  MCV 95.0  PLT 191   CBG:  Recent Labs Lab 12/31/15 0623 12/31/15 1106 12/31/15 1609 12/31/15 2034 01/01/16 0625  GLUCAP 153* 108* 137* 150* 135*  Urinalysis    Component Value Date/Time   COLORURINE YELLOW 12/31/2015 2145   APPEARANCEUR CLEAR 12/31/2015 2145   LABSPEC 1.021 12/31/2015 2145   PHURINE 5.0 12/31/2015 2145   GLUCOSEU NEGATIVE 12/31/2015 2145   HGBUR NEGATIVE 12/31/2015 2145   BILIRUBINUR NEGATIVE 12/31/2015 2145   KETONESUR NEGATIVE 12/31/2015 2145   PROTEINUR NEGATIVE 12/31/2015 2145   UROBILINOGEN 0.2 12/10/2011 1740   NITRITE NEGATIVE 12/31/2015 2145   LEUKOCYTESUR NEGATIVE 12/31/2015 2145   Microbiology No results found for this or any previous visit (from the past 240 hour(s)).  Inpatient Medications:   Scheduled Meds: . amitriptyline  25 mg Oral QHS  . amLODipine  10 mg Oral Daily  . baclofen  20 mg Oral q morning - 10a   And  . baclofen  40 mg Oral QHS  . cloNIDine  0.1 mg Oral BID  . darifenacin  7.5 mg Oral Daily  . gabapentin  1,600 mg Oral BID  . hydrochlorothiazide  12.5 mg Oral Daily  . insulin aspart  0-15 Units Subcutaneous TID WC  . insulin aspart  0-5 Units Subcutaneous QHS  . [START ON 01/05/2016] Interferon Beta-1a  10 mcg Intramuscular Weekly  . irbesartan  300 mg Oral Daily  . metFORMIN  1,000 mg Oral BID WC  . pantoprazole  40 mg Oral Daily  . pioglitazone  30 mg Oral Daily  . sodium chloride flush  3 mL Intravenous Q12H  . traZODone  50 mg Oral QHS   Continuous Infusions: . sodium chloride    . sodium chloride 100 mL/hr at 01/01/16 0100     Radiological Exams on Admission: Dg Lumbar Spine 2-3 Views  Result Date: 12/30/2015 CLINICAL DATA:  Lumbar fusion EXAM: LUMBAR SPINE - 2-3 VIEW; DG C-ARM 61-120 MIN COMPARISON:  Intraoperative images 7 07/01/2015, MRI lumbar spine  08/26/2015 FLUOROSCOPY TIME:  0 minutes 32 seconds Images obtained: 2 FINDINGS: Prior MR lumbar spine labeled demonstrating L5-S1 fusion. Preceding intraoperative images were labeled similarly for consistency. Current intraoperative C-arm images labeled similarly as well for consistency. Two digital C-arm fluoroscopic images demonstrate placement of BILATERAL pedicle screws and bars at what is labeled as L4-L5 on the prior MR. Intervening disc prosthesis at L4-L5 disc space. IMPRESSION: Lower lumbar fusion, labeled as L4-L5 to match the numbering system utilized on most recent MRI lumbar spine as discussed above. Electronically Signed   By: Lavonia Dana M.D.   On: 12/30/2015 18:32   Dg Lumbar Spine 2-3 Views  Result Date: 12/30/2015 CLINICAL DATA:  L5-S1 PLIF EXAM: LUMBAR SPINE - 2-3 VIEW COMPARISON:  Two portable intraoperative lateral views of  the lumbar spine are compared to prior MRI lumbar spine 08/26/2015 FINDINGS: Prior MRI lumbar spine is labeled with incorporation of the L5 vertebral body with S1 and last open disc space as the L4-L5. Current radiographs are labeled similar to the prior MR for consistency. Recommend correlation of this numbering system with that used on any additional prior imaging. Image #1 at 1538 hours: L5-S1 fusion. Vacuum phenomenon L4-L5. Metallic probe via dorsal approach projects dorsal to what is labeled the mid L3 level. Image #2 at 1548 hours: L5-S1 fusion. Curved metallic probe via dorsal approach projects dorsal to what is labeled the L4-L5 disc space. IMPRESSION: Numbering of lumbar spine to match MR numbering system as above - please note lumbar numbering as discussed above and as discussed in report of prior MR lumbar spine. Intraoperative lumbar localization images as above. Electronically Signed   By: Lavonia Dana M.D.   On: 12/30/2015 18:27   Dg C-arm 1-60 Min  Result Date: 12/30/2015 CLINICAL DATA:  Lumbar fusion EXAM: LUMBAR SPINE - 2-3 VIEW; DG C-ARM 61-120 MIN  COMPARISON:  Intraoperative images 7 07/01/2015, MRI lumbar spine 08/26/2015 FLUOROSCOPY TIME:  0 minutes 32 seconds Images obtained: 2 FINDINGS: Prior MR lumbar spine labeled demonstrating L5-S1 fusion. Preceding intraoperative images were labeled similarly for consistency. Current intraoperative C-arm images labeled similarly as well for consistency. Two digital C-arm fluoroscopic images demonstrate placement of BILATERAL pedicle screws and bars at what is labeled as L4-L5 on the prior MR. Intervening disc prosthesis at L4-L5 disc space. IMPRESSION: Lower lumbar fusion, labeled as L4-L5 to match the numbering system utilized on most recent MRI lumbar spine as discussed above. Electronically Signed   By: Lavonia Dana M.D.   On: 12/30/2015 18:32    Impression/Recommendations Active Problems:   MS (multiple sclerosis) (HCC)   Hypertension   Urinary retention   Lumbar stenosis   Spondylolisthesis of lumbar region   Fever   Diabetes mellitus, type 2 (HCC)   Acute blood loss anemia   Post-op fever. Normal WBC.Fever improving spontaneously ~ 100.0 now. No cough, no dysuria but positive urinary retention after removal of foley.  CXR U/a , urine culture Follow up on blood culture Am cbc  Post op anemia. Baseline hgb 11, down slightly at time of admission to 10.3. Post-operatively hgb has declined further to 6.8 (verified with repeat )  -Patient feels weak, will transfuse one unit of blood now. I spoke with surgeon and patient did have significant intraoperative bleeding. -check anemia panel prior to blood given that patient presented with hgb lower than his baseline.    Post op urinary retention. Problems voiding since foley removal 2 days ago. Bladder scan now = 900 cc . He has underlying hesitancy at home.  -placing foley  -? Eventual trial of flomax  DM2, controlled. Recent A1c 6.0 -Inpatient CBGs ranging 120-204.  -Home metformin and actos have already been restarted.  -continue moderate  scale SSI TID with meals and SSI at bedtime  HTN,currently controlled -continue avapro, microzide, catapres, norvasc  Multiple sclerosis, weekly avonex  Thank you for this consultation.  Our [facility] hospitalist team will follow the patient with you.   Time Spent: 55 minutes  Tye Savoy M.D. [facility] 01/01/2016, 10:10 AM

## 2016-01-02 LAB — GLUCOSE, CAPILLARY
GLUCOSE-CAPILLARY: 119 mg/dL — AB (ref 65–99)
GLUCOSE-CAPILLARY: 110 mg/dL — AB (ref 65–99)
GLUCOSE-CAPILLARY: 103 mg/dL — AB (ref 65–99)
Glucose-Capillary: 139 mg/dL — ABNORMAL HIGH (ref 65–99)

## 2016-01-02 LAB — CBC
HCT: 22.1 % — ABNORMAL LOW (ref 39.0–52.0)
HEMOGLOBIN: 7.3 g/dL — AB (ref 13.0–17.0)
MCH: 31.2 pg (ref 26.0–34.0)
MCHC: 33 g/dL (ref 30.0–36.0)
MCV: 94.4 fL (ref 78.0–100.0)
PLATELETS: 177 10*3/uL (ref 150–400)
RBC: 2.34 MIL/uL — AB (ref 4.22–5.81)
RDW: 15.5 % (ref 11.5–15.5)
WBC: 6.9 10*3/uL (ref 4.0–10.5)

## 2016-01-02 LAB — URINE CULTURE: Culture: NO GROWTH

## 2016-01-02 MED ORDER — BISACODYL 10 MG RE SUPP
10.0000 mg | Freq: Once | RECTAL | Status: AC
  Administered 2016-01-02: 10 mg via RECTAL
  Filled 2016-01-02: qty 1

## 2016-01-02 NOTE — Progress Notes (Signed)
Rehab admissions - I called insurance case Freight forward[employer].  We do not have a complete OT evaluation.  We need OT to assess ADLs before I can get an insurance determination.  Call me for questions.  RC:[phone]

## 2016-01-02 NOTE — Progress Notes (Signed)
Patient ID: Larry [name], male   DOB: [birth year], 65 y.o.   MRN: [id number] vss Motor function remains unchanged Incision is clean and dry Will need inpatient rehab Await decision from insurance/

## 2016-01-02 NOTE — Progress Notes (Signed)
Pt requesting to be placed back on bed pan.

## 2016-01-02 NOTE — Progress Notes (Signed)
Plan was to get pt up to chair for breakfast, rn and tech in room to transfer pt, pt stated that he thought he needed to have a bowel movement. Pt reports he has not had a bowel movement in over 1 week. Pt placed on bedpan. Unable to have bowel movement. rn offered suppository, pt accepted. Pt given suppository and set up to eat breakfast in the bed instead. Will attempt to get pt to chair for lunch.

## 2016-01-02 NOTE — Progress Notes (Signed)
Occupational Therapy Treatment Patient Details Name: Larry [name] MRN: [id number] DOB: 1950-10-03 Today's Date: 01/02/2016    History of present illness 65 yo male s/p PLIF L 5-S1 PMH: MS, HTN, HCL, DM   OT comments  Pt progressing towards acute OT goals. Focus of session was basic transfer, standing tolerance, and education on AE for LB ADLs. Pt needing +2 max physical assist for sit<>stand from recliner. Still feel pt is good candidate for CIR.   Follow Up Recommendations  CIR    Equipment Recommendations  3 in 1 bedside comode;Other (comment)    Recommendations for Other Services Rehab consult    Precautions / Restrictions Precautions Precautions: Back Precaution Comments: pt able to recall precautions  Required Braces or Orthoses: Spinal Brace Spinal Brace: Lumbar corset;Applied in sitting position Restrictions Weight Bearing Restrictions: No       Mobility Bed Mobility Overal bed mobility: Needs Assistance Bed Mobility: Rolling;Sidelying to Sit Rolling: Min assist Sidelying to sit: Mod assist       General bed mobility comments: up in chair  Transfers Overall transfer level: Needs assistance Equipment used: Rolling walker (2 wheeled) Transfers: Sit to/from Stand Sit to Stand: Max assist;+2 physical assistance Stand pivot transfers: Max assist;+2 physical assistance       General transfer comment: from recliner, 1x. Cues for hand placement. Pt positioning feet in sitting position using UEs. Trunk flexed position in standing. Assist for power up and balance.    Balance Overall balance assessment: Needs assistance Sitting-balance support: No upper extremity supported;Feet supported Sitting balance-Leahy Scale: Fair     Standing balance support: Bilateral upper extremity supported;During functional activity Standing balance-Leahy Scale: Zero                     ADL Overall ADL's : Needs assistance/impaired Eating/Feeding: Set up;Sitting    Grooming: Set up;Sitting   Upper Body Bathing: Set up;Sitting   Lower Body Bathing: Maximal assistance;+2 for physical assistance;Sit to/from stand   Upper Body Dressing : Set up;Sitting   Lower Body Dressing: Maximal assistance;+2 for physical assistance;Sit to/from stand   Toilet Transfer: Maximal assistance;+2 for physical assistance;Stand-pivot;RW   Toileting- Clothing Manipulation and Hygiene: Maximal assistance;+2 for physical assistance;Sit to/from stand   Tub/ Shower Transfer: Maximal assistance;+2 for physical assistance;Stand-pivot;3 in 1;Rolling walker     General ADL Comments: Pt completed 1x sit<>stand from recliner and stood with max A for about 20 seconds. ADL assist level information based on clinical judgment, performance during this session and with PT. Educated on AE and techniques for LB ADLs. Pt with difficulty raising feet to practice stepping in place.       Vision                     Perception     Praxis      Cognition   Behavior During Therapy: WFL for tasks assessed/performed Overall Cognitive Status: Within Functional Limits for tasks assessed                       Extremity/Trunk Assessment               Exercises     Shoulder Instructions       General Comments      Pertinent Vitals/ Pain       Pain Assessment: 0-10 Pain Score: 5  Pain Location: back Pain Descriptors / Indicators: Sore Pain Intervention(s): Limited activity within patient's tolerance;Monitored during session;Repositioned  Home Living  Prior Functioning/Environment              Frequency Min 2X/week     Progress Toward Goals  OT Goals(current goals can now be found in the care plan section)  Progress towards OT goals: Progressing toward goals  Acute Rehab OT Goals Patient Stated Goal: get better OT Goal Formulation: With patient Time For Goal Achievement:  01/14/16 Potential to Achieve Goals: Good ADL Goals Pt Will Perform Grooming: with supervision;sitting Pt Will Perform Lower Body Bathing: with supervision;with adaptive equipment;sit to/from stand Pt Will Perform Lower Body Dressing: with supervision;with adaptive equipment;sit to/from stand Pt Will Transfer to Toilet: with supervision;bedside commode Additional ADL Goal #1: Pt will complete bed mobility supervision level as precursor to adls  Plan Discharge plan remains appropriate    Co-evaluation                 End of Session Equipment Utilized During Treatment: Gait belt;Rolling walker;Back brace   Activity Tolerance Patient tolerated treatment well   Patient Left in chair;with call bell/phone within reach;with chair alarm set   Nurse Communication          Time: 1351-1405 OT Time Calculation (min): 14 min  Charges: OT General Charges $OT Visit: 1 Procedure OT Treatments $Self Care/Home Management : 8-22 mins  Hortencia Pilar 01/02/2016, 2:26 PM

## 2016-01-02 NOTE — Progress Notes (Signed)
Rehab admissions - I have called and faxed OT notes to insurance case manager.  Awaiting determination for possible acute inpatient rehab admission.  Call me for questions.  [phone]

## 2016-01-02 NOTE — Care Management Important Message (Signed)
Important Message  Patient Details  Name: Larry [name] MRN: [id number] Date of Birth: [birth year]   Medicare Important Message Given:  Yes    [name] 01/02/2016, 10:52 AM

## 2016-01-02 NOTE — Progress Notes (Signed)
Physical Therapy Treatment Patient Details Name: Larry [name] MRN: [id number] DOB: [birth year] Today's Date: 01/02/2016    History of Present Illness 65 yo male s/p PLIF L 5-S1 PMH: MS, HTN, HCL, DM    PT Comments    Patient is making gradual progress toward mobility goals with improvement in standing tolerance this session. Continues to need max A +2 for OOB transfers and assist for bed mobility. Current plan remains appropriate.   Follow Up Recommendations  CIR;Supervision/Assistance - 24 hour     Equipment Recommendations  Rolling walker with 5" wheels;3in1 (PT)    Recommendations for Other Services Rehab consult     Precautions / Restrictions Precautions Precautions: Back Precaution Comments: pt able to recall precautions  Required Braces or Orthoses: Spinal Brace Spinal Brace: Lumbar corset;Applied in sitting position Restrictions Weight Bearing Restrictions: No    Mobility  Bed Mobility Overal bed mobility: Needs Assistance Bed Mobility: Rolling;Sidelying to Sit Rolling: Min assist Sidelying to sit: Mod assist       General bed mobility comments: assist to bring bilat LE to EOB with pt having difficutly with knee flexion and assist to elevate trunkn into sitting; HOB slightly elevated; cues for technique with some carry over demonstrated  Transfers Overall transfer level: Needs assistance Equipment used: Rolling walker (2 wheeled) Transfers: Sit to/from [facility] Sit to Stand: Max assist;+2 physical assistance Stand pivot transfers: Max assist;+2 physical assistance       General transfer comment: sit to stand X3 (1 from EOB and 2 from recliner) and stand pivot X1; max A +2 to power up into standing and to take pivotal steps with assist for balance, weight shifting, and management of RW; pt unable to achieve erect posture and maintained bilat knee and trunk flexion; therapist blocked bilat knees and brought into extension with multimodal cues  for posture and some improvement noted but unable to maintain for more than ~15 seconds  Ambulation/Gait                 Stairs            Wheelchair Mobility    Modified Rankin (Stroke Patients Only)       Balance     Sitting balance-Leahy Scale: Fair       Standing balance-Leahy Scale: Zero                      Cognition Arousal/Alertness: Awake/alert Behavior During Therapy: WFL for tasks assessed/performed Overall Cognitive Status: Within Functional Limits for tasks assessed                      Exercises      General Comments        Pertinent Vitals/Pain Pain Assessment: 0-10 Pain Score: 5  Pain Location: back with mobility Pain Descriptors / Indicators: Sore Pain Intervention(s): Limited activity within patient's tolerance;Monitored during session;Premedicated before session;Repositioned    Home Living                      Prior Function            PT Goals (current goals can now be found in the care plan section) Acute Rehab PT Goals Patient Stated Goal: get better PT Goal Formulation: With patient/family Time For Goal Achievement: 01/07/16 Potential to Achieve Goals: Good Progress towards PT goals: Progressing toward goals    Frequency  Min 5X/week    PT Plan Current plan remains appropriate  Co-evaluation             End of Session Equipment Utilized During Treatment: Gait belt Activity Tolerance: Patient tolerated treatment well Patient left: in chair;with call bell/phone within reach;with chair alarm set     Time: [id number] PT Time Calculation (min) (ACUTE ONLY): 23 min  Charges:  $Therapeutic Activity: 23-37 mins                    G Codes:      Salina April, PTA Pager: [phone]   01/02/2016, 11:33 AM

## 2016-01-02 NOTE — Progress Notes (Signed)
PROGRESS NOTE    Larry [name]  [id number] DOB: [birth year] DOA: 12/30/2015 PCP: Lucretia Kern., DO   Brief Narrative: Post operative fever with urinary retention. 65 yo male sp elective spinal surgery. Symptoms improved with foley catheter.  Assessment & Plan:   Active Problems:   MS (multiple sclerosis) (HCC)   Hypertension   Urinary retention   Lumbar stenosis   Spondylolisthesis of lumbar region   Fever   Diabetes mellitus, type 2 (HCC)   Acute blood loss anemia   Elective surgery   Post-operative pain   Tachycardia   AKI (acute kidney injury) ([place])   Prediabetes   1. Post operative fever. No signs of systemic infection, will continue to keep foley catheter for now, will recommend to continue incentive spirometer.  2. HTN. Systolic blood pressure 123456 to 128. Will continue amlodipine, hydralazine, irbesartan, hctz, clonidine.   3. Urinary retention. Will continue foley catheter for now. Urine analysis with no signs of infection.  4. T2DM,. Will continue insulin sliding scale. Patient tolerating po well. Serum glucose 166-135-119-139-110.  5. Acute blood loss anemia Hb at 7.3, further blood transfusion per primary team. Follow cell count in am.     DVT prophylaxis:  Code Status: full Family Communication:  Disposition Plan:    Consultants:  NA  Procedures:   Antimicrobials:  Subjective: Patient feeling better, foley catheter in place, back pain improved, no nausea or vomiting, no cough or dyspnea. Using incentive spirometer.   Objective: Vitals:   01/02/16 0456 01/02/16 0850 01/02/16 0944 01/02/16 1458  BP: (!) 126/59 128/65 (!) 118/54 119/62  Pulse: 87 93 96 92  Resp: 20 16 20 18   Temp: 98.3 F (36.8 C)  98.4 F (36.9 C) 99.1 F (37.3 C)  TempSrc: Oral  Oral Oral  SpO2: 98% 100% 98% 97%  Weight:      Height:        Intake/Output Summary (Last 24 hours) at 01/02/16 1554 Last data filed at 01/02/16 0944  Gross per 24 hour  Intake               480 ml  Output             1801 ml  Net            -1321 ml   Filed Weights   12/30/15 1026 12/30/15 1951  Weight: 73 kg (161 lb) 76.5 kg (168 lb 11.2 oz)    Examination:  General exam: Appears calm and comfortable  Not in pain E ENT: no conjunctival pallor, oral mucosa moist. Respiratory system: Clear to auscultation. Respiratory effort normal. No wheezing, rales or rhonchi. Cardiovascular system: S1 & S2 heard, RRR. No JVD, murmurs, rubs, gallops or clicks. No pedal edema. Gastrointestinal system: Abdomen is nondistended, soft and nontender. No organomegaly or masses felt. Normal bowel sounds heard. Central nervous system: Alert and oriented. No focal neurological deficits. Extremities: Symmetric 5 x 5 power. Skin: No rashes, lesions or ulcers    Data Reviewed: I have personally reviewed following labs and imaging studies  CBC:  Recent Labs Lab 12/31/15 2100 01/01/16 0956 01/02/16 0622  WBC 6.2 7.7 6.9  NEUTROABS 5.3 6.9  --   HGB 6.8* 6.8* 7.3*  HCT 20.9* 20.7* 22.1*  MCV 95.0 97.2 94.4  PLT 191 189 [id number]   Basic Metabolic Panel:  Recent Labs Lab 01/01/16 0956  NA 137  K 3.8  CL 106  CO2 25  GLUCOSE 204*  BUN 20  CREATININE 1.23  CALCIUM 8.4*   GFR: Estimated Creatinine Clearance: 54.5 mL/min (by C-G formula based on SCr of 1.23 mg/dL). Liver Function Tests:  Recent Labs Lab 01/01/16 0956  AST 36  ALT 16*  ALKPHOS 40  BILITOT 0.6  PROT 5.6*  ALBUMIN 3.4*   No results for input(s): LIPASE, AMYLASE in the last 168 hours. No results for input(s): AMMONIA in the last 168 hours. Coagulation Profile: No results for input(s): INR, PROTIME in the last 168 hours. Cardiac Enzymes: No results for input(s): CKTOTAL, CKMB, CKMBINDEX, TROPONINI in the last 168 hours. BNP (last 3 results) No results for input(s): PROBNP in the last 8760 hours. HbA1C: No results for input(s): HGBA1C in the last 72 hours. CBG:  Recent Labs Lab 01/01/16 1105  01/01/16 1641 01/01/16 2013 01/02/16 0623 01/02/16 1128  GLUCAP 204* 166* 135* 119* 139*   Lipid Profile: No results for input(s): CHOL, HDL, LDLCALC, TRIG, CHOLHDL, LDLDIRECT in the last 72 hours. Thyroid Function Tests: No results for input(s): TSH, T4TOTAL, FREET4, T3FREE, THYROIDAB in the last 72 hours. Anemia Panel:  Recent Labs  01/01/16 1139  VITAMINB12 748  FOLATE 19.0  FERRITIN 76  TIBC 224*  IRON 8*  RETICCTPCT 1.6   Sepsis Labs: No results for input(s): PROCALCITON, LATICACIDVEN in the last 168 hours.  Recent Results (from the past 240 hour(s))  Culture, blood (routine x 2)     Status: None (Preliminary result)   Collection Time: 12/31/15  8:54 PM  Result Value Ref Range Status   Specimen Description BLOOD RIGHT ANTECUBITAL  Final   Special Requests BOTTLES DRAWN AEROBIC ONLY 6CC  Final   Culture NO GROWTH 2 DAYS  Final   Report Status PENDING  Incomplete  Culture, blood (routine x 2)     Status: None (Preliminary result)   Collection Time: 12/31/15  8:57 PM  Result Value Ref Range Status   Specimen Description BLOOD RIGHT HAND  Final   Special Requests IN PEDIATRIC BOTTLE 3CC  Final   Culture NO GROWTH 2 DAYS  Final   Report Status PENDING  Incomplete  Culture, Urine     Status: None   Collection Time: 01/01/16 12:47 PM  Result Value Ref Range Status   Specimen Description URINE, RANDOM  Final   Special Requests NONE  Final   Culture NO GROWTH  Final   Report Status 01/02/2016 FINAL  Final         Radiology Studies: No results found.      Scheduled Meds: . sodium chloride   Intravenous Once  . amitriptyline  25 mg Oral QHS  . amLODipine  10 mg Oral Daily  . baclofen  20 mg Oral q morning - 10a   And  . baclofen  40 mg Oral QHS  . cloNIDine  0.1 mg Oral BID  . darifenacin  7.5 mg Oral Daily  . gabapentin  1,600 mg Oral BID  . hydrochlorothiazide  12.5 mg Oral Daily  . insulin aspart  0-15 Units Subcutaneous TID WC  . insulin aspart   0-5 Units Subcutaneous QHS  . [START ON 01/05/2016] Interferon Beta-1a  10 mcg Intramuscular Weekly  . irbesartan  300 mg Oral Daily  . metFORMIN  1,000 mg Oral BID WC  . pantoprazole  40 mg Oral Daily  . pioglitazone  30 mg Oral Daily  . sodium chloride flush  3 mL Intravenous Q12H  . traZODone  50 mg Oral QHS   Continuous Infusions: . sodium chloride    .  sodium chloride 100 mL/hr at 01/01/16 1101     LOS: 3 days       Teyla Skidgel Gerome Apley, MD [facility] Pager [phone]  If 7PM-7AM, please contact night-coverage [email or web address] Password [email or web address] 01/02/2016, 3:54 PM

## 2016-01-03 LAB — BASIC METABOLIC PANEL
Anion gap: 7 (ref 5–15)
BUN: 17 mg/dL (ref 6–20)
CHLORIDE: 103 mmol/L (ref 101–111)
CO2: 29 mmol/L (ref 22–32)
CREATININE: 1.07 mg/dL (ref 0.61–1.24)
Calcium: 8.7 mg/dL — ABNORMAL LOW (ref 8.9–10.3)
GFR calc non Af Amer: >60 mL/min (ref >60)
GLUCOSE: 101 mg/dL — AB (ref 65–99)
Potassium: 3.8 mmol/L (ref 3.5–5.1)
Sodium: 139 mmol/L (ref 135–145)

## 2016-01-03 LAB — CBC WITH DIFFERENTIAL/PLATELET
Basophils Absolute: 0 10*3/uL (ref 0.0–0.1)
Basophils Relative: 0 %
EOS PCT: 3 %
Eosinophils Absolute: 0.2 10*3/uL (ref 0.0–0.7)
HEMATOCRIT: 22.1 % — AB (ref 39.0–52.0)
HEMOGLOBIN: 7.1 g/dL — AB (ref 13.0–17.0)
Lymphocytes Relative: 12 %
Lymphs Abs: 0.6 10*3/uL — ABNORMAL LOW (ref 0.7–4.0)
MCH: 30.6 pg (ref 26.0–34.0)
MCHC: 32.1 g/dL (ref 30.0–36.0)
MCV: 95.3 fL (ref 78.0–100.0)
MONO ABS: 0.2 10*3/uL (ref 0.1–1.0)
Monocytes Relative: 4 %
NEUTROS ABS: 4.3 10*3/uL (ref 1.7–7.7)
Neutrophils Relative %: 81 %
Platelets: 239 10*3/uL (ref 150–400)
RBC: 2.32 MIL/uL — ABNORMAL LOW (ref 4.22–5.81)
RDW: 15.3 % (ref 11.5–15.5)
WBC: 5.3 10*3/uL (ref 4.0–10.5)

## 2016-01-03 LAB — GLUCOSE, CAPILLARY
GLUCOSE-CAPILLARY: 109 mg/dL — AB (ref 65–99)
Glucose-Capillary: 100 mg/dL — ABNORMAL HIGH (ref 65–99)
Glucose-Capillary: 100 mg/dL — ABNORMAL HIGH (ref 65–99)
Glucose-Capillary: 130 mg/dL — ABNORMAL HIGH (ref 65–99)

## 2016-01-03 MED ORDER — TAMSULOSIN HCL 0.4 MG PO CAPS
0.4000 mg | ORAL_CAPSULE | Freq: Every day | ORAL | Status: DC
  Administered 2016-01-03 – 2016-01-05 (×3): 0.4 mg via ORAL
  Filled 2016-01-03 (×3): qty 1

## 2016-01-03 NOTE — Progress Notes (Signed)
Patient ID: Larry [name], male   DOB: [birth year], 65 y.o.   MRN: [id number] Vital signs are stable Motor function appears to be improving very slowly Patient has high-grade paraparesis He did walk some today with the use of a walker He would be good candidate for rehabilitation if insurance company can provide such service I will write for tentative discharge summary today

## 2016-01-03 NOTE — Progress Notes (Signed)
Physical Therapy Treatment Patient Details Name: Larry [name] MRN: [id number] DOB: 1950-10-08 Today's Date: 01/03/2016    History of Present Illness 65 yo male s/p PLIF L 5-S1 PMH: MS, HTN, HCL, DM    PT Comments    Patient is gradually progressing toward mobility goals with ability to ambulate short distance. Current plan remains appropriate.   Follow Up Recommendations  CIR;Supervision/Assistance - 24 hour     Equipment Recommendations  Rolling walker with 5" wheels;3in1 (PT)    Recommendations for Other Services Rehab consult     Precautions / Restrictions Precautions Precautions: Back Precaution Comments: pt able to recall precautions  Required Braces or Orthoses: Spinal Brace Spinal Brace: Lumbar corset;Applied in sitting position Restrictions Weight Bearing Restrictions: No    Mobility  Bed Mobility               General bed mobility comments: OOB in chair upon arrival  Transfers Overall transfer level: Needs assistance Equipment used: Rolling walker (2 wheeled) Transfers: Sit to/from Stand Sit to Stand: Mod assist;Max assist;+2 physical assistance         General transfer comment: sit to stand X3; mod A first 2 trials and max A 3rd trial; assist to power up into standing; multimodal cues for posture, bilat knee extension, and vc for forward gaze; assist to transition hand placement from RW to recliner  Ambulation/Gait Ambulation/Gait assistance: Mod assist;+2 safety/equipment Ambulation Distance (Feet): 12 Feet (6X2 ) Assistive device: Rolling walker (2 wheeled) Gait Pattern/deviations: Step-through pattern;Decreased stride length;Decreased dorsiflexion - right;Decreased dorsiflexion - left     General Gait Details: max cues for posture, technique/sequencing, and assist for management of RW and advancement of bilat LE; pt with improved bilat knee extension and upright posture this session   Stairs            Wheelchair Mobility     Modified Rankin (Stroke Patients Only)       Balance     Sitting balance-Leahy Scale: Fair       Standing balance-Leahy Scale: Poor                      Cognition Arousal/Alertness: Awake/alert Behavior During Therapy: WFL for tasks assessed/performed Overall Cognitive Status: Within Functional Limits for tasks assessed                      Exercises General Exercises - Lower Extremity Quad Sets: AROM;Both;10 reps;Seated Long Arc Quad: AROM;Both;10 reps;Seated Heel Slides: AROM;Both;5 reps;Seated Hip Flexion/Marching: AROM;Both;10 reps;Seated    General Comments        Pertinent Vitals/Pain Pain Assessment: 0-10 Pain Score: 6  Pain Location: back Pain Descriptors / Indicators: Aching;Sore Pain Intervention(s): Limited activity within patient's tolerance;Monitored during session;Premedicated before session;Repositioned    Home Living                      Prior Function            PT Goals (current goals can now be found in the care plan section) Acute Rehab PT Goals Patient Stated Goal: get better PT Goal Formulation: With patient/family Time For Goal Achievement: 01/07/16 Potential to Achieve Goals: Good Progress towards PT goals: Progressing toward goals    Frequency  Min 5X/week    PT Plan Current plan remains appropriate    Co-evaluation             End of Session Equipment Utilized During Treatment: Gait belt Activity Tolerance:  Patient tolerated treatment well Patient left: in chair;with call bell/phone within reach;with chair alarm set     Time: [id number] PT Time Calculation (min) (ACUTE ONLY): 23 min  Charges:  $Gait Training: 8-22 mins $Therapeutic Activity: 8-22 mins                    G Codes:      Salina April, PTA Pager: [phone]   01/03/2016, 2:42 PM

## 2016-01-03 NOTE — Progress Notes (Signed)
PROGRESS NOTE    Larry [name]  [id number] DOB: 1950/07/17 DOA: 12/30/2015 PCP: Lucretia Kern., DO   Brief Narrative:  Post operative fever with urinary retention. 65 yo male sp elective spinal surgery. Symptoms improved with foley catheter.  Assessment & Plan:   Active Problems:   MS (multiple sclerosis) (HCC)   Hypertension   Urinary retention   Lumbar stenosis   Spondylolisthesis of lumbar region   Fever   Diabetes mellitus, type 2 (HCC)   Acute blood loss anemia   Elective surgery   Post-operative pain   Tachycardia   AKI (acute kidney injury) ([place])   Prediabetes   1. Post operative fever. Patient continue to be afebrile, will continue to hold on antibiotic therapy. Out of bed as tolerated, continue incentive spirometer.  2. HTN. Systolic blood pressure [id number] to 117. Will continue amlodipine, hydralazine, irbesartan, hctz, clonidine.   3. Urinary retention. Will continue foley catheter for now. Urine analysis with no signs of infection. Patient has BPH symptoms, will start on flomax, and will need voiding trial next week.    4. T2DM,. Will continue insulin sliding scale. Patient tolerating po well. Serum glucose 103-100. Metformin and pioglitazone have been resumed  5. Acute blood loss anemia.  Hb stable at  7.30- 7.10, further blood transfusion per primary team. Follow cell count in am.   6. Depression. Continue amitriptyline and trazodone.   DVT prophylaxis:  Scd Code Status: full Family Communication:  Disposition Plan:    Consultants:     Procedures:   Antimicrobials:   Subjective: Patient feeling well, out of bed to chair, foley catheter in place. At home patient with decreased urinary flow, has to push to urinate, increased urinary frequency.  Objective: Vitals:   01/02/16 2214 01/03/16 0136 01/03/16 0550 01/03/16 0948  BP: 129/65 110/60 119/64 117/62  Pulse: 89 87 90 94  Resp: 20 18 18 18   Temp: 98.9 F (37.2 C) 99.2 F (37.3 C) 98.6  F (37 C) 98.9 F (37.2 C)  TempSrc: Oral Oral Oral Oral  SpO2: 100% 99% 98% 99%  Weight:      Height:        Intake/Output Summary (Last 24 hours) at 01/03/16 1226 Last data filed at 01/03/16 0555  Gross per 24 hour  Intake                0 ml  Output              600 ml  Net             -600 ml   Filed Weights   12/30/15 1026 12/30/15 1951  Weight: 73 kg (161 lb) 76.5 kg (168 lb 11.2 oz)    Examination:  General exam: no in pain or dyspnea E ENT: no conjunctival pallor, oral mucosa moist Respiratory system: Clear to auscultation. Respiratory effort normal. No wheezing, rales or rhonchi. Cardiovascular system: S1 & S2 heard, RRR. No JVD, murmurs, rubs, gallops or clicks. No pedal edema. Gastrointestinal system: Abdomen is nondistended, soft and nontender. No organomegaly or masses felt. Normal bowel sounds heard. Central nervous system: Alert and oriented. No focal neurological deficits. Extremities: Symmetric 5 x 5 power. Skin: No rashes, lesions or ulcers   Data Reviewed: I have personally reviewed following labs and imaging studies  CBC:  Recent Labs Lab 12/31/15 2100 01/01/16 0956 01/02/16 0622 01/03/16 0517  WBC 6.2 7.7 6.9 5.3  NEUTROABS 5.3 6.9  --  4.3  HGB 6.8* 6.8* 7.3*  7.1*  HCT 20.9* 20.7* 22.1* 22.1*  MCV 95.0 97.2 94.4 95.3  PLT 191 189 177 [id number]   Basic Metabolic Panel:  Recent Labs Lab 01/01/16 0956 01/03/16 0517  NA 137 139  K 3.8 3.8  CL 106 103  CO2 25 29  GLUCOSE 204* 101*  BUN 20 17  CREATININE 1.23 1.07  CALCIUM 8.4* 8.7*   GFR: Estimated Creatinine Clearance: 62.6 mL/min (by C-G formula based on SCr of 1.07 mg/dL). Liver Function Tests:  Recent Labs Lab 01/01/16 0956  AST 36  ALT 16*  ALKPHOS 40  BILITOT 0.6  PROT 5.6*  ALBUMIN 3.4*   No results for input(s): LIPASE, AMYLASE in the last 168 hours. No results for input(s): AMMONIA in the last 168 hours. Coagulation Profile: No results for input(s): INR, PROTIME in  the last 168 hours. Cardiac Enzymes: No results for input(s): CKTOTAL, CKMB, CKMBINDEX, TROPONINI in the last 168 hours. BNP (last 3 results) No results for input(s): PROBNP in the last 8760 hours. HbA1C: No results for input(s): HGBA1C in the last 72 hours. CBG:  Recent Labs Lab 01/02/16 1128 01/02/16 1631 01/02/16 2210 01/03/16 0640 01/03/16 1116  GLUCAP 139* 110* 103* 100* 100*   Lipid Profile: No results for input(s): CHOL, HDL, LDLCALC, TRIG, CHOLHDL, LDLDIRECT in the last 72 hours. Thyroid Function Tests: No results for input(s): TSH, T4TOTAL, FREET4, T3FREE, THYROIDAB in the last 72 hours. Anemia Panel:  Recent Labs  01/01/16 1139  VITAMINB12 748  FOLATE 19.0  FERRITIN 76  TIBC 224*  IRON 8*  RETICCTPCT 1.6   Sepsis Labs: No results for input(s): PROCALCITON, LATICACIDVEN in the last 168 hours.  Recent Results (from the past 240 hour(s))  Culture, blood (routine x 2)     Status: None (Preliminary result)   Collection Time: 12/31/15  8:54 PM  Result Value Ref Range Status   Specimen Description BLOOD RIGHT ANTECUBITAL  Final   Special Requests BOTTLES DRAWN AEROBIC ONLY 6CC  Final   Culture NO GROWTH 2 DAYS  Final   Report Status PENDING  Incomplete  Culture, blood (routine x 2)     Status: None (Preliminary result)   Collection Time: 12/31/15  8:57 PM  Result Value Ref Range Status   Specimen Description BLOOD RIGHT HAND  Final   Special Requests IN PEDIATRIC BOTTLE 3CC  Final   Culture NO GROWTH 2 DAYS  Final   Report Status PENDING  Incomplete  Culture, Urine     Status: None   Collection Time: 01/01/16 12:47 PM  Result Value Ref Range Status   Specimen Description URINE, RANDOM  Final   Special Requests NONE  Final   Culture NO GROWTH  Final   Report Status 01/02/2016 FINAL  Final         Radiology Studies: No results found.      Scheduled Meds: . sodium chloride   Intravenous Once  . amitriptyline  25 mg Oral QHS  . amLODipine  10  mg Oral Daily  . baclofen  20 mg Oral q morning - 10a   And  . baclofen  40 mg Oral QHS  . cloNIDine  0.1 mg Oral BID  . darifenacin  7.5 mg Oral Daily  . gabapentin  1,600 mg Oral BID  . hydrochlorothiazide  12.5 mg Oral Daily  . insulin aspart  0-15 Units Subcutaneous TID WC  . insulin aspart  0-5 Units Subcutaneous QHS  . [START ON 01/05/2016] Interferon Beta-1a  10 mcg Intramuscular Weekly  .  irbesartan  300 mg Oral Daily  . metFORMIN  1,000 mg Oral BID WC  . pantoprazole  40 mg Oral Daily  . pioglitazone  30 mg Oral Daily  . sodium chloride flush  3 mL Intravenous Q12H  . traZODone  50 mg Oral QHS   Continuous Infusions: . sodium chloride    . sodium chloride 100 mL/hr at 01/01/16 1101     LOS: 4 days        Sherri Levenhagen Gerome Apley, MD [facility] Pager [phone]  If 7PM-7AM, please contact night-coverage [email or web address] Password [email or web address] 01/03/2016, 12:26 PM

## 2016-01-03 NOTE — NC FL2 (Signed)
[place] LEVEL OF CARE SCREENING TOOL     IDENTIFICATION  Patient Name: Larry [name] Birthdate: [birth year] Sex: male Admission Date (Current Location): 12/30/2015  [facility] and [place] Number:  Herbalist and Address:  [facility] [place], [place] [address] [place], [place] [address]      Provider Number: [id number]  Attending Physician Name and Address:  Kristeen Miss, MD  Relative Name and Phone Number:  Aarin, [place] - Daughter - [phone]     Current Level of Care: Hospital Recommended Level of Care: [place] Prior Approval Number:    Date Approved/Denied:   PASRR Number: [id number] (Eff. 12/31/15)  Discharge Plan: SNF    Current Diagnoses: Patient Active Problem List   Diagnosis Date Noted  . Fever 01/01/2016  . Diabetes mellitus, type 2 ([place]) 01/01/2016  . Acute blood loss anemia 01/01/2016  . Elective surgery   . Post-operative pain   . Tachycardia   . AKI (acute kidney injury) ([place])   . Prediabetes   . Spondylolisthesis of lumbar region 12/30/2015  . CTS (carpal tunnel syndrome) 09/26/2015  . Lumbar stenosis 09/26/2015  . Weakness 09/05/2015  . Spinal stenosis in cervical region 06/12/2015  . Leukocytopenia 05/22/2013  . 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 Paraplegia ([place]) 12/11/2011  . Altered mental status 12/11/2011  . DM (diabetes mellitus), type 2, uncontrolled with complications ([place]) [id number]  . Urinary retention 12/11/2011  . MS (multiple sclerosis) ([place])   . Hypertension     Orientation RESPIRATION BLADDER Height & Weight     Self, Time, Situation, Place  Normal Continent (Urethral catheter in place) Weight: 168 lb 11.2 oz (76.5 kg) Height:  5\' 7"  (170.2 cm)  BEHAVIORAL SYMPTOMS/MOOD NEUROLOGICAL BOWEL NUTRITION STATUS      Continent Diet (Carb modified)  AMBULATORY STATUS COMMUNICATION OF NEEDS Skin   Total Care (Patient unable to ambulate with therapist) Verbally Other  (Comment) (Incision on back)                       Personal Care Assistance Level of Assistance  Bathing, Feeding, Dressing Bathing Assistance: Maximum assistance Feeding assistance: Independent (Needs assistance with set-up) Dressing Assistance: Maximum assistance     Functional Limitations Info  Sight, Hearing, Speech Sight Info: Adequate Hearing Info: Adequate Speech Info: Adequate    SPECIAL CARE FACTORS FREQUENCY  PT (By licensed PT), OT (By licensed OT)     PT Frequency: Evaluated 8/1 and a minimum of 5 days per week therapy recommended OT Frequency: Evaluated 8/1 and a minimum of 2 days per week therapy recommended            Contractures Contractures Info: Not present    Additional Factors Info  Code Status, Allergies, Insulin Sliding Scale Code Status Info: Full Allergies Info: No known allergies   Insulin Sliding Scale Info: 0-15 Units 3X daily with meals.  0-5 Units daily at bedtime       Current Medications (01/03/2016):  This is the current hospital active medication list Current Facility-Administered Medications  Medication Dose Route Frequency Provider Last Rate Last Dose  . 0.9 %  sodium chloride infusion  250 mL Intravenous Continuous Kristeen Miss, MD      . 0.9 %  sodium chloride infusion   Intravenous Continuous Kristeen Miss, MD 100 mL/hr at 01/01/16 1101    . 0.9 %  sodium chloride infusion   Intravenous Once Willia Craze, NP 10 mL/hr at  01/01/16 1258    . acetaminophen (TYLENOL) tablet 650 mg  650 mg Oral Q4H PRN Kristeen Miss, MD   650 mg at 01/03/16 0818   Or  . acetaminophen (TYLENOL) suppository 650 mg  650 mg Rectal Q4H PRN Kristeen Miss, MD      . alum & mag hydroxide-simeth (MAALOX/MYLANTA) 200-200-20 MG/5ML suspension 30 mL  30 mL Oral Q6H PRN Kristeen Miss, MD      . amitriptyline (ELAVIL) tablet 25 mg  25 mg Oral QHS Kristeen Miss, MD   25 mg at 01/02/16 2210  . amLODipine (NORVASC) tablet 10 mg  10 mg Oral Daily Kristeen Miss, MD   10  mg at 01/03/16 0950  . baclofen (LIORESAL) tablet 20 mg  20 mg Oral q morning - 10a Kristeen Miss, MD   20 mg at 01/03/16 0951   And  . baclofen (LIORESAL) tablet 40 mg  40 mg Oral QHS Kristeen Miss, MD   40 mg at 01/02/16 2211  . cloNIDine (CATAPRES) tablet 0.1 mg  0.1 mg Oral BID Kristeen Miss, MD   0.1 mg at 01/03/16 0951  . darifenacin (ENABLEX) 24 hr tablet 7.5 mg  7.5 mg Oral Daily Kristeen Miss, MD   7.5 mg at 01/03/16 0950  . gabapentin (NEURONTIN) capsule 1,600 mg  1,600 mg Oral BID Kristeen Miss, MD   1,600 mg at 01/03/16 0956  . hydrochlorothiazide (MICROZIDE) capsule 12.5 mg  12.5 mg Oral Daily Kristeen Miss, MD   12.5 mg at 01/03/16 0950  . insulin aspart (novoLOG) injection 0-15 Units  0-15 Units Subcutaneous TID WC Earnie Larsson, MD   2 Units at 01/02/16 1154  . insulin aspart (novoLOG) injection 0-5 Units  0-5 Units Subcutaneous QHS Earnie Larsson, MD   Stopped at 01/02/16 2212  . [START ON 01/05/2016] Interferon Beta-1a AJKT 10 mcg  10 mcg Intramuscular Weekly Kristeen Miss, MD      . irbesartan (AVAPRO) tablet 300 mg  300 mg Oral Daily Kristeen Miss, MD   300 mg at 01/03/16 0950  . menthol-cetylpyridinium (CEPACOL) lozenge 3 mg  1 lozenge Oral PRN Kristeen Miss, MD       Or  . phenol (CHLORASEPTIC) mouth spray 1 spray  1 spray Mouth/Throat PRN Kristeen Miss, MD      . metFORMIN (GLUCOPHAGE) tablet 1,000 mg  1,000 mg Oral BID WC Kristeen Miss, MD   1,000 mg at 01/03/16 0803  . methocarbamol (ROBAXIN) tablet 500 mg  500 mg Oral Q6H PRN Kristeen Miss, MD   500 mg at 01/03/16 0818   Or  . methocarbamol (ROBAXIN) 500 mg in dextrose 5 % 50 mL IVPB  500 mg Intravenous Q6H PRN Kristeen Miss, MD      . morphine 2 MG/ML injection 1-4 mg  1-4 mg Intravenous Q3H PRN Kristeen Miss, MD   2 mg at 01/01/16 0914  . ondansetron (ZOFRAN) injection 4 mg  4 mg Intravenous Q4H PRN Kristeen Miss, MD      . pantoprazole (PROTONIX) EC tablet 40 mg  40 mg Oral Daily Kristeen Miss, MD   40 mg at 01/03/16 0951  . pioglitazone  (ACTOS) tablet 30 mg  30 mg Oral Daily Kristeen Miss, MD   30 mg at 01/03/16 0951  . sodium chloride flush (NS) 0.9 % injection 3 mL  3 mL Intravenous Q12H Kristeen Miss, MD   3 mL at 01/03/16 1000  . sodium chloride flush (NS) 0.9 % injection 3 mL  3 mL Intravenous PRN Mallie Mussel  Shadiamond Koska, MD      . traZODone (DESYREL) tablet 50 mg  50 mg Oral QHS Kristeen Miss, MD   50 mg at 01/02/16 2210     Discharge Medications: Please see discharge summary for a list of discharge medications.  Relevant Imaging Results:  Relevant Lab Results:   Additional Information ss#[id number]  Sable Feil, LCSW

## 2016-01-03 NOTE — Progress Notes (Signed)
Rehab admissions - I have a denial from insurance carrier for acute inpatient rehab admission.  I spoke with patient and he does wish for me to proceed with an expedited appeal with insurance carrier.  I have called [facility] insurance and have begun the expedited appeal process.  Call me for questions.  [id number]

## 2016-01-03 NOTE — Discharge Summary (Signed)
Physician Discharge Summary  Patient ID: Norbert Dufrene MRN: [id number] DOB/AGE: January 16, 1951 65 y.o.  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 Active Problems:   MS (multiple sclerosis) (HCC)   Hypertension   Urinary retention   Lumbar stenosis   Spondylolisthesis of lumbar region   Fever   Diabetes mellitus, type 2 (HCC)   Acute blood loss anemia   Elective surgery   Post-operative pain   Tachycardia   AKI (acute kidney injury) ([place])   Prediabetes   Discharged Condition: fair  Hospital Course: Patient was admitted to undergo surgical decompression at L4-5 and L5-S1. His mobility status has been limited but is felt that he benefit from comprehensive inpatient rehabilitation. These arrangements are now in the process.  Consults: Hospitalists service  Significant Diagnostic Studies: None  Treatments: surgery: Bilateral decompression L4-5 and L5-S1 with placement of Coflex L4-5 L5-S1  Discharge Exam: Blood pressure 115/65, pulse 87, temperature 98.1 F (36.7 C), temperature source Oral, resp. rate 18, height 5\' 7"  (1.702 m), weight 76.5 kg (168 lb 11.2 oz), SpO2 97 %. Incision on back is clean and dry motor function is stable and lower extremities with significant proximal distal leg weakness.  Disposition: Skilled nursing facility or rehabilitation  Discharge Instructions    Call MD for:  redness, tenderness, or signs of infection (pain, swelling, redness, odor or green/yellow discharge around incision site)    Complete by:  As directed   Call MD for:  severe uncontrolled pain    Complete by:  As directed   Call MD for:  temperature >100.4     Complete by:  As directed   Diet - low sodium heart healthy    Complete by:  As directed   Increase activity slowly    Complete by:  As directed       Medication List    TAKE these medications   ACCU-CHEK AVIVA PLUS test strip Generic drug:  glucose blood Test once daily   ACCU-CHEK AVIVA Soln   accu-chek multiclix lancets Use to check blood glucose  once daily   ALEVE 220 MG tablet Generic drug:  naproxen sodium Take 440 mg by mouth daily as needed.   amitriptyline 25 MG tablet Commonly known as:  ELAVIL Take 1 tablet by mouth at bedtime.   aspirin 81 MG tablet Take 81 mg by mouth daily.   baclofen 10 MG tablet Commonly known as:  LIORESAL Take 2 tablets (20 mg total) by mouth 3 (three) times daily. What changed:  how much to take  when to take this  additional instructions   cholecalciferol 1000 units tablet Commonly known as:  VITAMIN D Take 1,000 Units by mouth daily.   cloNIDine 0.1 MG tablet Commonly known as:  CATAPRES Take 1 tablet by mouth two  times daily   diclofenac 75 MG EC tablet Commonly known as:  VOLTAREN Take 75 mg by mouth as needed.   gabapentin 800 MG tablet Commonly known as:  NEURONTIN Take 1,600 mg by mouth 2 (two) times daily. Pt takes 2 tablets in the morning, 2 tablets at bedtime   Interferon Beta-1a 30 MCG/0.5ML Ajkt Commonly known as:  AVONEX PEN Inject 30 mcg into the muscle once a week. What changed:  how much to take   meloxicam 7.5  MG tablet Commonly known as:  MOBIC Take 1 tablet (7.5 mg total) by mouth 2 (two) times daily.   metFORMIN 1000 MG tablet Commonly known as:  GLUCOPHAGE Take 1 tablet by mouth two  times daily with meals   multivitamin tablet Take 1 tablet by mouth daily.   Olmesartan-Amlodipine-HCTZ 40-10-12.5 MG Tabs Commonly known as:  TRIBENZOR Take 1 tablet by mouth  daily   omeprazole 20 MG capsule Commonly known as:  PRILOSEC Take 1 capsule (20 mg total) by mouth daily.   pioglitazone 30  MG tablet Commonly known as:  ACTOS Take 1 tablet by mouth  every day   traMADol 50 MG tablet Commonly known as:  ULTRAM Take 1 tablet (50 mg total) by mouth at bedtime as needed for severe pain. for pain   traZODone 50 MG tablet Commonly known as:  DESYREL Take 1 tablet (50 mg total) by mouth at bedtime.   VESICARE 10 MG tablet Generic drug:  solifenacin Take 1 tablet by mouth  daily   VITAMIN B-12 PO Take 1 tablet by mouth 2 (two) times daily. Patient is unsure of dose        Signed: Onalee Steinbach J 01/03/2016, 7:15 PM

## 2016-01-03 NOTE — Clinical Social Work Note (Signed)
Patient evaluated by CIR and information submitted to insurance for authorization and denied. An expedited appeal was initiated. CSW will continue to follow and assist with discharge planning if needed.  Coley Kulikowski Givens, MSW, LCSW Licensed Clinical Social Worker [place] [phone]

## 2016-01-04 LAB — GLUCOSE, CAPILLARY
GLUCOSE-CAPILLARY: 116 mg/dL — AB (ref 65–99)
Glucose-Capillary: 107 mg/dL — ABNORMAL HIGH (ref 65–99)
Glucose-Capillary: 130 mg/dL — ABNORMAL HIGH (ref 65–99)

## 2016-01-04 LAB — CBC WITH DIFFERENTIAL/PLATELET
BASOS ABS: 0 10*3/uL (ref 0.0–0.1)
BASOS PCT: 0 %
Eosinophils Absolute: 0.1 10*3/uL (ref 0.0–0.7)
Eosinophils Relative: 3 %
HEMATOCRIT: 22.4 % — AB (ref 39.0–52.0)
HEMOGLOBIN: 7.2 g/dL — AB (ref 13.0–17.0)
Lymphocytes Relative: 11 %
Lymphs Abs: 0.6 10*3/uL — ABNORMAL LOW (ref 0.7–4.0)
MCH: 31 pg (ref 26.0–34.0)
MCHC: 32.1 g/dL (ref 30.0–36.0)
MCV: 96.6 fL (ref 78.0–100.0)
MONOS PCT: 5 %
Monocytes Absolute: 0.2 10*3/uL (ref 0.1–1.0)
NEUTROS ABS: 4 10*3/uL (ref 1.7–7.7)
NEUTROS PCT: 81 %
Platelets: 275 10*3/uL (ref 150–400)
RBC: 2.32 MIL/uL — ABNORMAL LOW (ref 4.22–5.81)
RDW: 14.9 % (ref 11.5–15.5)
WBC: 4.9 10*3/uL (ref 4.0–10.5)

## 2016-01-04 LAB — TYPE AND SCREEN
ABO/RH(D): B POS
ANTIBODY SCREEN: NEGATIVE

## 2016-01-04 LAB — PREPARE RBC (CROSSMATCH)

## 2016-01-04 MED ORDER — SODIUM CHLORIDE 0.9 % IV SOLN
Freq: Once | INTRAVENOUS | Status: AC
  Administered 2016-01-04: 19:00:00 via INTRAVENOUS

## 2016-01-04 NOTE — Progress Notes (Signed)
Physical Therapy Treatment Patient Details Name: Larry [name] MRN: [id number] DOB: July 24, 1950 Today's Date: 01/04/2016    History of Present Illness 65 yo male s/p PLIF L 5-S1 PMH: MS, HTN, HCL, DM    PT Comments    Patient continues to have difficulty with advancing bilat LE forward and weight shifting in standing. Reviewed precautions and bed mobility. Max A +2 for OOB mobility. Curren plan remains appropriate.   Follow Up Recommendations  CIR;Supervision/Assistance - 24 hour     Equipment Recommendations  Rolling walker with 5" wheels;3in1 (PT)    Recommendations for Other Services Rehab consult     Precautions / Restrictions Precautions Precautions: Back Precaution Comments: pt able to recall precautions  Required Braces or Orthoses: Spinal Brace Spinal Brace: Lumbar corset;Applied in sitting position Restrictions Weight Bearing Restrictions: No    Mobility  Bed Mobility Overal bed mobility: Needs Assistance Bed Mobility: Rolling;Sit to Sidelying Rolling: Min guard       Sit to sidelying: Mod assist General bed mobility comments: OOB in chair upon arrival; assist to maintain back precautions when going to sidelying and elevate bilat LE into bed; cues for sequencing/technique   Transfers Overall transfer level: Needs assistance Equipment used: Rolling walker (2 wheeled) Transfers: Sit to/from [employer] Transfers Sit to Stand: Max assist;+2 physical assistance Stand pivot transfers: Max assist;+2 physical assistance       General transfer comment: sit to stands X3; stand pivot X1; assist to power up into standing; pt with increased dificulty pushing through bilat LE to stand and tends to use UE strength for anterior translation and maintains bilat knee flexion; max multimodal cues for posture and assist to manage RW when pivoting  Ambulation/Gait Ambulation/Gait assistance: Mod assist;+2 physical assistance Ambulation Distance (Feet): 10 Feet  (5X2) Assistive device: Rolling walker (2 wheeled) Gait Pattern/deviations: Step-through pattern;Decreased stride length;Decreased dorsiflexion - right;Decreased dorsiflexion - left;Trunk flexed;Narrow base of support     General Gait Details: multimodal cues for posture and bilat knee extension   Stairs            Wheelchair Mobility    Modified Rankin (Stroke Patients Only)       Balance     Sitting balance-Leahy Scale: Fair       Standing balance-Leahy Scale: Poor                      Cognition Arousal/Alertness: Awake/alert Behavior During Therapy: WFL for tasks assessed/performed Overall Cognitive Status: Within Functional Limits for tasks assessed                      Exercises      General Comments General comments (skin integrity, edema, etc.): pt c/o increased "stiffness" in bilat LE; educated pt on positioning and how often to change position;      Pertinent Vitals/Pain Pain Assessment: 0-10 Pain Score: 3  Pain Location: back Pain Descriptors / Indicators: Sore Pain Intervention(s): Limited activity within patient's tolerance;Monitored during session;Premedicated before session;Repositioned    Home Living                      Prior Function            PT Goals (current goals can now be found in the care plan section) Acute Rehab PT Goals Patient Stated Goal: get better PT Goal Formulation: With patient/family Time For Goal Achievement: 01/07/16 Potential to Achieve Goals: Good Progress towards PT goals: Progressing toward goals  Frequency  Min 5X/week    PT Plan Current plan remains appropriate    Co-evaluation             End of Session Equipment Utilized During Treatment: Gait belt Activity Tolerance: Patient tolerated treatment well Patient left: with call bell/phone within reach;in bed;with bed alarm set;with nursing/sitter in room     Time: 0929-0951 PT Time Calculation (min) (ACUTE ONLY):  22 min  Charges:  $Gait Training: 8-22 mins                    G Codes:      [place]ina April, PTA Pager: [phone]  01/04/2016, 10:32 AM

## 2016-01-04 NOTE — Progress Notes (Addendum)
LCSW met with patient and gave bed offers with regards to discharge to SNF due to lack of insurance authorization with CIR.  Patient has chosen [facility] for SNF placement. Contact being made to transfer patient today for SNF placement.    DC summary completed. Awaiting contact with facility and will follow up. Patient reports he will notify his daughter who is out of town.  Patient will transport by EMS. RN made aware of possible DC.  LCSW was able to reach facility for possible DC. They are reviewing admissions for the day and will call back. Patient also getting blood today, which may delay DC until Sunday.  Will have CSW follow up with [facility] Sunday for admission due to patient receiving blood and facility has not followed up this afternoon.   Lane Hacker, MSW Clinical Social Work: [employer] [phone]

## 2016-01-04 NOTE — Progress Notes (Signed)
PROGRESS NOTE    Larry [name]  [id number] DOB: 08-May-1951 DOA: 12/30/2015 PCP: Lucretia Kern., DO   Brief Narrative:  Post operative fever with urinary retention. 65 yo male sp elective spinal surgery. Symptoms improved with foley catheter.  Assessment & Plan:   Assessment & Plan:   Active Problems:   MS (multiple sclerosis) (HCC)   Hypertension   Urinary retention   Lumbar stenosis   Spondylolisthesis of lumbar region   Fever   Diabetes mellitus, type 2 (HCC)   Acute blood loss anemia   Elective surgery   Post-operative pain   Tachycardia   AKI (acute kidney injury) ([place])   Prediabetes  1. Post operative fever. Patient continue to be afebrile, out of bed as tolerated, continue incentive spirometer.  2. HTN. Systolic blood pressure 123456 to 122. Will continue amlodipine, hydralazine, irbesartan, hctz, clonidine.   3. Urinary retention. Patient started on flomax, will keep foley catheter for now and will plan for voiding trial next week.   4. T2DM Patient tolerating po well. Serum glucose 116-130. Continue insulin sliding scale,  Metformin and pioglitazone.  5. Acute blood loss anemia.  hb has remain stable at 7, considering patient will need physical therapy, will recommend transfusion of 1 unit PRBC.  6. Depression. Continue amitriptyline and trazodone.    DVT prophylaxis: Scd Code Status:full Family Communication: Disposition Plan:    Consultants:      Procedures:   Antimicrobials:    Subjective: Patient has been afebrile, no chest pain or dyspnea. Foley in place. Tolerating po well, no nausea or vomiting.   Objective: Vitals:   01/03/16 2221 01/04/16 0138 01/04/16 0615 01/04/16 0954  BP: 117/62 108/67 122/71 104/63  Pulse: 82 86 81 85  Resp: 16 18 18 20   Temp: 98.4 F (36.9 C) 98.8 F (37.1 C) 98.6 F (37 C) 98.1 F (36.7 C)  TempSrc: Oral Oral Oral Oral  SpO2: 97% 100% 97% 98%  Weight:      Height:         Intake/Output Summary (Last 24 hours) at 01/04/16 1336 Last data filed at 01/04/16 1222  Gross per 24 hour  Intake              360 ml  Output              925 ml  Net             -565 ml   Filed Weights   12/30/15 1026 12/30/15 1951  Weight: 73 kg (161 lb) 76.5 kg (168 lb 11.2 oz)    Examination:  General exam: not in pain or dyspnea E ENT: mild conjunctival pallor, oral mucosa moist  Respiratory system: Respiratory effort normal. No wheezing, rales or rhonchi. Cardiovascular system: S1 & S2 heard, RRR. No JVD, murmurs, rubs, gallops or clicks. No pedal edema. Gastrointestinal system: Abdomen is nondistended, soft and nontender. No organomegaly or masses felt. Normal bowel sounds heard. Central nervous system: Alert and oriented. No focal neurological deficits. Extremities: Symmetric 5 x 5 power. Skin: No rashes, lesions or ulcers .     Data Reviewed: I have personally reviewed following labs and imaging studies  CBC:  Recent Labs Lab 12/31/15 2100 01/01/16 0956 01/02/16 0622 01/03/16 0517 01/04/16 0809  WBC 6.2 7.7 6.9 5.3 4.9  NEUTROABS 5.3 6.9  --  4.3 4.0  HGB 6.8* 6.8* 7.3* 7.1* 7.2*  HCT 20.9* 20.7* 22.1* 22.1* 22.4*  MCV 95.0 97.2 94.4 95.3 96.6  PLT 191 189  177 239 [id number]   Basic Metabolic Panel:  Recent Labs Lab 01/01/16 0956 01/03/16 0517  NA 137 139  K 3.8 3.8  CL 106 103  CO2 25 29  GLUCOSE 204* 101*  BUN 20 17  CREATININE 1.23 1.07  CALCIUM 8.4* 8.7*   GFR: Estimated Creatinine Clearance: 62.6 mL/min (by C-G formula based on SCr of 1.07 mg/dL). Liver Function Tests:  Recent Labs Lab 01/01/16 0956  AST 36  ALT 16*  ALKPHOS 40  BILITOT 0.6  PROT 5.6*  ALBUMIN 3.4*   No results for input(s): LIPASE, AMYLASE in the last 168 hours. No results for input(s): AMMONIA in the last 168 hours. Coagulation Profile: No results for input(s): INR, PROTIME in the last 168 hours. Cardiac Enzymes: No results for input(s): CKTOTAL, CKMB,  CKMBINDEX, TROPONINI in the last 168 hours. BNP (last 3 results) No results for input(s): PROBNP in the last 8760 hours. HbA1C: No results for input(s): HGBA1C in the last 72 hours. CBG:  Recent Labs Lab 01/03/16 1116 01/03/16 1710 01/03/16 2211 01/04/16 0629 01/04/16 1150  GLUCAP 100* 109* 130* 107* 116*   Lipid Profile: No results for input(s): CHOL, HDL, LDLCALC, TRIG, CHOLHDL, LDLDIRECT in the last 72 hours. Thyroid Function Tests: No results for input(s): TSH, T4TOTAL, FREET4, T3FREE, THYROIDAB in the last 72 hours. Anemia Panel: No results for input(s): VITAMINB12, FOLATE, FERRITIN, TIBC, IRON, RETICCTPCT in the last 72 hours. Sepsis Labs: No results for input(s): PROCALCITON, LATICACIDVEN in the last 168 hours.  Recent Results (from the past 240 hour(s))  Culture, blood (routine x 2)     Status: None (Preliminary result)   Collection Time: 12/31/15  8:54 PM  Result Value Ref Range Status   Specimen Description BLOOD RIGHT ANTECUBITAL  Final   Special Requests BOTTLES DRAWN AEROBIC ONLY [place]  Final   Culture NO GROWTH 3 DAYS  Final   Report Status PENDING  Incomplete  Culture, blood (routine x 2)     Status: None (Preliminary result)   Collection Time: 12/31/15  8:57 PM  Result Value Ref Range Status   Specimen Description BLOOD RIGHT HAND  Final   Special Requests IN PEDIATRIC BOTTLE 3CC  Final   Culture NO GROWTH 3 DAYS  Final   Report Status PENDING  Incomplete  Culture, Urine     Status: None   Collection Time: 01/01/16 12:47 PM  Result Value Ref Range Status   Specimen Description URINE, RANDOM  Final   Special Requests NONE  Final   Culture NO GROWTH  Final   Report Status 01/02/2016 FINAL  Final         Radiology Studies: No results found.      Scheduled Meds: . sodium chloride   Intravenous Once  . amitriptyline  25 mg Oral QHS  . amLODipine  10 mg Oral Daily  . baclofen  20 mg Oral q morning - 10a   And  . baclofen  40 mg Oral QHS  .  cloNIDine  0.1 mg Oral BID  . darifenacin  7.5 mg Oral Daily  . gabapentin  1,600 mg Oral BID  . hydrochlorothiazide  12.5 mg Oral Daily  . insulin aspart  0-15 Units Subcutaneous TID WC  . insulin aspart  0-5 Units Subcutaneous QHS  . [START ON 01/05/2016] Interferon Beta-1a  10 mcg Intramuscular Weekly  . irbesartan  300 mg Oral Daily  . metFORMIN  1,000 mg Oral BID WC  . pantoprazole  40 mg Oral Daily  .  pioglitazone  30 mg Oral Daily  . sodium chloride flush  3 mL Intravenous Q12H  . tamsulosin  0.4 mg Oral Daily  . traZODone  50 mg Oral QHS   Continuous Infusions: . sodium chloride    . sodium chloride 100 mL/hr at 01/01/16 1101     LOS: 5 days       Leena Tiede Gerome Apley, MD [facility] Pager [phone]  If 7PM-7AM, please contact night-coverage [email or web address] Password [email or web address] 01/04/2016, 1:36 PM

## 2016-01-04 NOTE — Progress Notes (Signed)
Patient ID: Larry [name], male   DOB: August 27, 1950, 65 y.o.   MRN: [id number] Patient doing okay making progress condition of back pain no leg pain still slow to mobilize is only ambulate around the room.  Strength 5 out of 5 wound clean dry and intact  PT OT placement a skilled nursing

## 2016-01-04 NOTE — Clinical Social Work Placement (Signed)
   CLINICAL SOCIAL WORK PLACEMENT  NOTE  Date:  01/04/2016  Patient Details  Name: Larry [name] MRN: [id number] Date of Birth: 10-17-1950  Clinical Social Work is seeking post-discharge placement for this patient at the [place] level of care (*CSW will initial, date and re-position this form in  chart as items are completed):  Yes   Patient/family provided with [place] Work Department's list of facilities offering this level of care within the geographic area requested by the patient (or if unable, by the patient's family).  Yes   Patient/family informed of their freedom to choose among providers that offer the needed level of care, that participate in Medicare, Medicaid or managed care program needed by the patient, have an available bed and are willing to accept the patient.  Yes   Patient/family informed of [place]'s ownership interest in [facility] and [facility], as well as of the fact that they are under no obligation to receive care at these facilities.  PASRR submitted to EDS on 01/04/16     PASRR number received on 01/04/16     Existing PASRR number confirmed on       FL2 transmitted to all facilities in geographic area requested by pt/family on 01/04/16     FL2 transmitted to all facilities within larger geographic area on       Patient informed that his/her managed care company has contracts with or will negotiate with certain facilities, including the following:        Yes   Patient/family informed of bed offers received.  Patient chooses bed at     [facility] recommends and patient chooses bed at     SNF Patient to be transferred to   on  .  Patient to be transferred to facility by       Patient family notified on   of transfer.  Name of family member notified:        PHYSICIAN Please sign FL2     Additional Comment:    _______________________________________________ Lilly [place],  LCSW 01/04/2016, 2:49 PM

## 2016-01-05 DIAGNOSIS — Z419 Encounter for procedure for purposes other than remedying health state, unspecified: Secondary | ICD-10-CM | POA: Diagnosis not present

## 2016-01-05 DIAGNOSIS — G8220 Paraplegia, unspecified: Secondary | ICD-10-CM | POA: Diagnosis not present

## 2016-01-05 DIAGNOSIS — M4726 Other spondylosis with radiculopathy, lumbar region: Secondary | ICD-10-CM | POA: Diagnosis not present

## 2016-01-05 DIAGNOSIS — M4316 Spondylolisthesis, lumbar region: Secondary | ICD-10-CM | POA: Diagnosis not present

## 2016-01-05 DIAGNOSIS — Z8669 Personal history of other diseases of the nervous system and sense organs: Secondary | ICD-10-CM | POA: Diagnosis not present

## 2016-01-05 DIAGNOSIS — M549 Dorsalgia, unspecified: Secondary | ICD-10-CM | POA: Diagnosis not present

## 2016-01-05 DIAGNOSIS — R509 Fever, unspecified: Secondary | ICD-10-CM | POA: Diagnosis not present

## 2016-01-05 DIAGNOSIS — Z4889 Encounter for other specified surgical aftercare: Secondary | ICD-10-CM | POA: Diagnosis not present

## 2016-01-05 DIAGNOSIS — E119 Type 2 diabetes mellitus without complications: Secondary | ICD-10-CM | POA: Diagnosis not present

## 2016-01-05 DIAGNOSIS — D62 Acute posthemorrhagic anemia: Secondary | ICD-10-CM | POA: Diagnosis not present

## 2016-01-05 DIAGNOSIS — M6281 Muscle weakness (generalized): Secondary | ICD-10-CM | POA: Diagnosis not present

## 2016-01-05 DIAGNOSIS — Z981 Arthrodesis status: Secondary | ICD-10-CM | POA: Diagnosis not present

## 2016-01-05 DIAGNOSIS — R338 Other retention of urine: Secondary | ICD-10-CM | POA: Diagnosis not present

## 2016-01-05 DIAGNOSIS — M4806 Spinal stenosis, lumbar region: Secondary | ICD-10-CM | POA: Diagnosis not present

## 2016-01-05 DIAGNOSIS — I10 Essential (primary) hypertension: Secondary | ICD-10-CM | POA: Diagnosis not present

## 2016-01-05 LAB — CBC WITH DIFFERENTIAL/PLATELET
Basophils Absolute: 0 10*3/uL (ref 0.0–0.1)
Basophils Relative: 0 %
EOS PCT: 2 %
Eosinophils Absolute: 0.1 10*3/uL (ref 0.0–0.7)
HEMATOCRIT: 25.3 % — AB (ref 39.0–52.0)
Hemoglobin: 8 g/dL — ABNORMAL LOW (ref 13.0–17.0)
LYMPHS ABS: 0.7 10*3/uL (ref 0.7–4.0)
LYMPHS PCT: 15 %
MCH: 29.4 pg (ref 26.0–34.0)
MCHC: 31.6 g/dL (ref 30.0–36.0)
MCV: 93 fL (ref 78.0–100.0)
Monocytes Absolute: 0.3 10*3/uL (ref 0.1–1.0)
Monocytes Relative: 5 %
NEUTROS ABS: 3.9 10*3/uL (ref 1.7–7.7)
Neutrophils Relative %: 78 %
PLATELETS: 286 10*3/uL (ref 150–400)
RBC: 2.72 MIL/uL — AB (ref 4.22–5.81)
RDW: 15.9 % — ABNORMAL HIGH (ref 11.5–15.5)
WBC: 5 10*3/uL (ref 4.0–10.5)

## 2016-01-05 LAB — CULTURE, BLOOD (ROUTINE X 2)
CULTURE: NO GROWTH
CULTURE: NO GROWTH

## 2016-01-05 LAB — TYPE AND SCREEN
ABO/RH(D): B POS
ANTIBODY SCREEN: NEGATIVE
UNIT DIVISION: 0
Unit division: 0

## 2016-01-05 LAB — GLUCOSE, CAPILLARY
GLUCOSE-CAPILLARY: 102 mg/dL — AB (ref 65–99)
GLUCOSE-CAPILLARY: 111 mg/dL — AB (ref 65–99)
Glucose-Capillary: 91 mg/dL (ref 65–99)

## 2016-01-05 NOTE — Progress Notes (Signed)
Received PRBC transfusion yesterday Feels well AVSS Moving legs well Stable Dispo planning

## 2016-01-05 NOTE — Progress Notes (Signed)
Clinical Social Work  CSW spoke with Larry [name] at [facility] who is agreeable to accept Larry [name] today. SNF confirmed they received DC summary. CSW prepared DC packet with face sheet and PTAR forms. No hard script for Tramadol was left but SNF reports it is not needed and can accept Larry [name] without hard script. RN to call report.  CSW met with Larry [name] and informed him of DC plans. He was agreeable to PTAR and will contact his family to update them on DC plans.  CSW is signing off but available if needed.   , LCSW Weekend Coverage  

## 2016-01-05 NOTE — Progress Notes (Signed)
Pt being transferred to [place]. Pt still has foley in per orders from MD. Pt educated on transfer and incision care before discharge. [facility] called and report given to Ms. Monet. Pt's IV's were removed before discharge. Pt exited hospital via stretcher.

## 2016-01-05 NOTE — Clinical Social Work Placement (Signed)
   CLINICAL SOCIAL WORK PLACEMENT  NOTE  Date:  01/05/2016  Patient Details  Name: Larry [name] MRN: [id number] Date of Birth: [birth year]  Clinical Social Work is seeking post-discharge placement for this patient at the [place] level of care (*CSW will initial, date and re-position this form in  chart as items are completed):  Yes   Patient/family provided with [place] Work Department's list of facilities offering this level of care within the geographic area requested by the patient (or if unable, by the patient's family).  Yes   Patient/family informed of their freedom to choose among providers that offer the needed level of care, that participate in Medicare, Medicaid or managed care program needed by the patient, have an available bed and are willing to accept the patient.  Yes   Patient/family informed of [place]'s ownership interest in [facility] and [facility], as well as of the fact that they are under no obligation to receive care at these facilities.  PASRR submitted to EDS on 01/04/16     PASRR number received on 01/04/16     Existing PASRR number confirmed on       FL2 transmitted to all facilities in geographic area requested by pt/family on 01/04/16     FL2 transmitted to all facilities within larger geographic area on       Patient informed that his/her managed care company has contracts with or will negotiate with certain facilities, including the following:        Yes   Patient/family informed of bed offers received.  Patient chooses bed at [facility]     Physician recommends and patient chooses bed at      Patient to be transferred to   on 01/05/16.  Patient to be transferred to facility by PTAR     Patient family notified on 01/05/16 of transfer.  Name of family member notified:  Pt in room to contact family      PHYSICIAN Please sign FL2     Additional Comment:     _______________________________________________ Boone Master, [place] 01/05/2016, 3:38 PM

## 2016-01-05 NOTE — Progress Notes (Signed)
PROGRESS NOTE    Larry [name]  [id number] DOB: 12-17-1950 DOA: 12/30/2015 PCP: Lucretia Kern., DO   Brief Narrative: Post operative fever with urinary retention. 65 yo male sp elective spinal surgery. Symptoms improved with foley catheter.   Assessment & Plan:   Active Problems:   MS (multiple sclerosis) (HCC)   Hypertension   Urinary retention   Lumbar stenosis   Spondylolisthesis of lumbar region   Fever   Diabetes mellitus, type 2 (HCC)   Acute blood loss anemia   Elective surgery   Post-operative pain   Tachycardia   AKI (acute kidney injury) ([place])   Prediabetes   1. Post operative fever. Patient continue to be afebrile, out of bed as tolerated, continue incentive spirometer.  2. HTN. Systolic blood pressure [id number]. Will continue amlodipine, hydralazine, irbesartan, hctz, clonidine.   3. Urinary retention. Patient started on flomax, will keep foley catheter for now and will plan for voiding trial next week.   4. T2DM Patient tolerating po well. Serum glucose 116-130. Continue insulin sliding scale,  Metformin and pioglitazone.  5. Acute blood loss anemia. hb has remain stable at 7, considering patient will need physical therapy, will recommend transfusion of 1 unit PRBC.  6. Depression. Continue amitriptyline and trazodone.     DVT prophylaxis:Scd Code Status:full Family Communication: Disposition Plan:   Consultants:     Procedures: L4-L5 decompressive laminectomy decompression of L4 and L5 nerve roots, posterior lumbar interbody arthrodesis with peek spacers local autograft and allograft, pedicle screw fixation L4-L5, posterior lateral arthrodesis L4-L5 (07/31)   Antimicrobials:  Subjective: Patient is feeling well, no nausea or vomiting, out of bed to chair, no dyspnea or chest pain.   Objective: Vitals:   01/04/16 2151 01/05/16 0109 01/05/16 0628 01/05/16 1352  BP: 127/70 129/62 128/69 129/69  Pulse: 84 86 88 96  Resp:  18 20 20 20   Temp: 98.6 F (37 C) 98.6 F (37 C) 98.3 F (36.8 C) 99 F (37.2 C)  TempSrc: Oral Oral Oral Oral  SpO2: 99% 98% 100% 99%  Weight:      Height:        Intake/Output Summary (Last 24 hours) at 01/05/16 1448 Last data filed at 01/05/16 0600  Gross per 24 hour  Intake             1753 ml  Output             1950 ml  Net             -197 ml   Filed Weights   12/30/15 1026 12/30/15 1951  Weight: 73 kg (161 lb) 76.5 kg (168 lb 11.2 oz)    Examination:  General exam: no in pain or dyspnea E ENT: no significant cojunctivall pallor, no icterus  Respiratory system: Clear to auscultation. Respiratory effort normal. No wheezing, rales or rhonchi. Cardiovascular system: S1 & S2 heard, RRR. No JVD, murmurs, rubs, gallops or clicks. No pedal edema. Gastrointestinal system: Abdomen is nondistended, soft and nontender. No organomegaly or masses felt. Normal bowel sounds heard. Central nervous system: Alert and oriented. No focal neurological deficits. Extremities: Symmetric 5 x 5 power. Skin: No rashes, lesions or ulcers Psychiatry: Judgement and insight appear normal. Mood & affect appropriate.     Data Reviewed: I have personally reviewed following labs and imaging studies  CBC:  Recent Labs Lab 12/31/15 2100 01/01/16 0956 01/02/16 0622 01/03/16 0517 01/04/16 0809 01/05/16 0343  WBC 6.2 7.7 6.9 5.3 4.9 5.0  NEUTROABS  5.3 6.9  --  4.3 4.0 3.9  HGB 6.8* 6.8* 7.3* 7.1* 7.2* 8.0*  HCT 20.9* 20.7* 22.1* 22.1* 22.4* 25.3*  MCV 95.0 97.2 94.4 95.3 96.6 93.0  PLT 191 189 177 239 275 [id number]   Basic Metabolic Panel:  Recent Labs Lab 01/01/16 0956 01/03/16 0517  NA 137 139  K 3.8 3.8  CL 106 103  CO2 25 29  GLUCOSE 204* 101*  BUN 20 17  CREATININE 1.23 1.07  CALCIUM 8.4* 8.7*   GFR: Estimated Creatinine Clearance: 62.6 mL/min (by C-G formula based on SCr of 1.07 mg/dL). Liver Function Tests:  Recent Labs Lab 01/01/16 0956  AST 36  ALT 16*  ALKPHOS 40    BILITOT 0.6  PROT 5.6*  ALBUMIN 3.4*   No results for input(s): LIPASE, AMYLASE in the last 168 hours. No results for input(s): AMMONIA in the last 168 hours. Coagulation Profile: No results for input(s): INR, PROTIME in the last 168 hours. Cardiac Enzymes: No results for input(s): CKTOTAL, CKMB, CKMBINDEX, TROPONINI in the last 168 hours. BNP (last 3 results) No results for input(s): PROBNP in the last 8760 hours. HbA1C: No results for input(s): HGBA1C in the last 72 hours. CBG:  Recent Labs Lab 01/04/16 1150 01/04/16 1630 01/04/16 2122 01/05/16 0627 01/05/16 1233  GLUCAP 116* 130* 91 102* 111*   Lipid Profile: No results for input(s): CHOL, HDL, LDLCALC, TRIG, CHOLHDL, LDLDIRECT in the last 72 hours. Thyroid Function Tests: No results for input(s): TSH, T4TOTAL, FREET4, T3FREE, THYROIDAB in the last 72 hours. Anemia Panel: No results for input(s): VITAMINB12, FOLATE, FERRITIN, TIBC, IRON, RETICCTPCT in the last 72 hours. Sepsis Labs: No results for input(s): PROCALCITON, LATICACIDVEN in the last 168 hours.  Recent Results (from the past 240 hour(s))  Culture, blood (routine x 2)     Status: None (Preliminary result)   Collection Time: 12/31/15  8:54 PM  Result Value Ref Range Status   Specimen Description BLOOD RIGHT ANTECUBITAL  Final   Special Requests BOTTLES DRAWN AEROBIC ONLY 6CC  Final   Culture NO GROWTH 4 DAYS  Final   Report Status PENDING  Incomplete  Culture, blood (routine x 2)     Status: None (Preliminary result)   Collection Time: 12/31/15  8:57 PM  Result Value Ref Range Status   Specimen Description BLOOD RIGHT HAND  Final   Special Requests IN PEDIATRIC BOTTLE 3CC  Final   Culture NO GROWTH 4 DAYS  Final   Report Status PENDING  Incomplete  Culture, Urine     Status: None   Collection Time: 01/01/16 12:47 PM  Result Value Ref Range Status   Specimen Description URINE, RANDOM  Final   Special Requests NONE  Final   Culture NO GROWTH  Final    Report Status 01/02/2016 FINAL  Final         Radiology Studies: No results found.      Scheduled Meds: . sodium chloride   Intravenous Once  . amitriptyline  25 mg Oral QHS  . amLODipine  10 mg Oral Daily  . baclofen  20 mg Oral q morning - 10a   And  . baclofen  40 mg Oral QHS  . cloNIDine  0.1 mg Oral BID  . darifenacin  7.5 mg Oral Daily  . gabapentin  1,600 mg Oral BID  . hydrochlorothiazide  12.5 mg Oral Daily  . insulin aspart  0-15 Units Subcutaneous TID WC  . insulin aspart  0-5 Units Subcutaneous QHS  .  irbesartan  300 mg Oral Daily  . metFORMIN  1,000 mg Oral BID WC  . pantoprazole  40 mg Oral Daily  . pioglitazone  30 mg Oral Daily  . sodium chloride flush  3 mL Intravenous Q12H  . tamsulosin  0.4 mg Oral Daily  . traZODone  50 mg Oral QHS   Continuous Infusions: . sodium chloride    . sodium chloride 100 mL/hr at 01/01/16 1101     LOS: 6 days        Meher Kucinski Gerome Apley, MD [facility] Pager [phone]  If 7PM-7AM, please contact night-coverage [email or web address] Password [email or web address] 01/05/2016, 2:48 PM

## 2016-01-06 ENCOUNTER — Encounter: Payer: Self-pay | Admitting: Internal Medicine

## 2016-01-06 ENCOUNTER — Non-Acute Institutional Stay (SKILLED_NURSING_FACILITY): Payer: Medicare Other | Admitting: Internal Medicine

## 2016-01-06 DIAGNOSIS — E119 Type 2 diabetes mellitus without complications: Secondary | ICD-10-CM

## 2016-01-06 DIAGNOSIS — M5416 Radiculopathy, lumbar region: Secondary | ICD-10-CM | POA: Diagnosis not present

## 2016-01-06 DIAGNOSIS — D62 Acute posthemorrhagic anemia: Secondary | ICD-10-CM

## 2016-01-06 DIAGNOSIS — K219 Gastro-esophageal reflux disease without esophagitis: Secondary | ICD-10-CM

## 2016-01-06 DIAGNOSIS — Z8669 Personal history of other diseases of the nervous system and sense organs: Secondary | ICD-10-CM | POA: Diagnosis not present

## 2016-01-06 DIAGNOSIS — I10 Essential (primary) hypertension: Secondary | ICD-10-CM | POA: Diagnosis not present

## 2016-01-06 DIAGNOSIS — M48061 Spinal stenosis, lumbar region without neurogenic claudication: Secondary | ICD-10-CM

## 2016-01-06 DIAGNOSIS — M48062 Spinal stenosis, lumbar region with neurogenic claudication: Secondary | ICD-10-CM

## 2016-01-06 DIAGNOSIS — M4806 Spinal stenosis, lumbar region: Secondary | ICD-10-CM

## 2016-01-06 DIAGNOSIS — E559 Vitamin D deficiency, unspecified: Secondary | ICD-10-CM

## 2016-01-06 DIAGNOSIS — N179 Acute kidney failure, unspecified: Secondary | ICD-10-CM

## 2016-01-06 DIAGNOSIS — Z87828 Personal history of other (healed) physical injury and trauma: Secondary | ICD-10-CM

## 2016-01-06 DIAGNOSIS — M4316 Spondylolisthesis, lumbar region: Secondary | ICD-10-CM | POA: Diagnosis not present

## 2016-01-06 DIAGNOSIS — R339 Retention of urine, unspecified: Secondary | ICD-10-CM

## 2016-01-06 NOTE — Progress Notes (Signed)
MRN: [id number] Name: Larry [name]  Sex: male Age: 65 y.o. DOB: September 08, 1950  [place] #:  Facility/Room: [facility] / [id number] P Level Of Care: SNF Provider: Noah Delaine. Sheppard Coil, MD Emergency Contacts: Extended Emergency Contact Information Primary Emergency Contact: Suella [place], [place] [address] [place] Phone: [phone] Mobile Phone: [phone] Relation: Daughter  Code Status: Full Code  Allergies: No known allergies  Chief Complaint  Patient presents with  . New Admit To SNF    Admit to Facility    HPI: Patient is 65 y.o. male who was admitted to [facility] from 7/31-8/5 for surgical decompression arthrodesis at L5-S1 for severe lumbar stenosis with neurogenic claudication and lumbar radiculopathy at L5-S1 level.  Past Medical History:  Diagnosis Date  . Abnormality of gait   . Acute delirium 12/11/2011  . CTS (carpal tunnel syndrome) 09/26/2015  . Diabetes mellitus   . Hypercholesterolemia   . Hypertension   . Kidney stones   . Leukocytopenia 05/22/2013  . MS (multiple sclerosis) [facility])    sees [facility]  . 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 BACK SURGERY    . COLONOSCOPY    . LUMBAR LAMINECTOMY        Medication List       Accurate as of 01/06/16 11:59 PM. Always use your most recent med list.          ACCU-CHEK AVIVA PLUS test strip Generic drug:  glucose blood Test once daily   ACCU-CHEK AVIVA Soln   accu-chek multiclix lancets Use to check blood glucose  once daily   ALEVE 220 MG tablet Generic drug:  naproxen sodium Take 440 mg by mouth daily as needed.   amitriptyline 25 MG tablet Commonly known as:  ELAVIL Take 1 tablet by mouth at bedtime.   aspirin 81 MG tablet Take 81 mg by mouth daily.   baclofen 20 MG tablet Commonly known as:  LIORESAL Take 20 mg by mouth 3 (three) times daily.    cholecalciferol 1000 units tablet Commonly known as:  VITAMIN D Take 1,000 Units by mouth daily.   cloNIDine 0.1 MG tablet Commonly known as:  CATAPRES Take 1 tablet by mouth two  times daily   diclofenac 75 MG EC tablet Commonly known as:  VOLTAREN Take 75 mg by mouth daily as needed.   gabapentin 800 MG tablet Commonly known as:  NEURONTIN Take 1,600 mg by mouth 2 (two) times daily. Pt takes 2 tablets in the morning, 2 tablets at bedtime   Interferon Beta-1a 30 MCG/0.5ML Ajkt Inject 30 mcg into the muscle once a week. On Sunday   meloxicam 7.5 MG tablet Commonly known as:  MOBIC Take 1 tablet (7.5 mg total) by mouth 2 (two) times daily.   metFORMIN 1000 MG tablet Commonly known as:  GLUCOPHAGE Take 1 tablet by mouth two  times daily with meals   multivitamin tablet Take 1 tablet by mouth daily.   Olmesartan-Amlodipine-HCTZ 40-10-12.5 MG Tabs Commonly known as:  TRIBENZOR Take 1 tablet by mouth  daily   omeprazole 20 MG capsule Commonly known as:  PRILOSEC Take 1 capsule (20 mg total) by mouth daily.   pioglitazone 30 MG tablet Commonly known as:  ACTOS Take 1 tablet by mouth  every day   traMADol 50 MG tablet Commonly known as:  Veatrice Bourbon  Take 1 tablet (50 mg total) by mouth at bedtime as needed for severe pain. for pain   traZODone 50 MG tablet Commonly known as:  DESYREL Take 1 tablet (50 mg total) by mouth at bedtime.   VESICARE 10 MG tablet Generic drug:  solifenacin Take 1 tablet by mouth  daily   VITAMIN B-12 PO Take 1 tablet by mouth 2 (two) times daily. Patient is unsure of dose       Meds ordered this encounter  Medications  . baclofen (LIORESAL) 20 MG tablet    Sig: Take 20 mg by mouth 3 (three) times daily.  Marland Kitchen DISCONTD: Interferon Beta-1a 30 MCG/0.5ML AJKT    Sig: Inject 30 mcg into the muscle once a week. On Sunday     Immunization History  Administered Date(s) Administered  . Influenza Split 02/05/2012  . Influenza, High Dose  Seasonal PF 03/05/2014, 05/17/2015  . Influenza,inj,Quad PF,36+ Mos 05/09/2013  . PPD Test 01/05/2016  . Pneumococcal Conjugate-13 03/05/2014  . Pneumococcal Polysaccharide-23 05/17/2015    Social History  Substance Use Topics  . Smoking status: Never Smoker  . Smokeless tobacco: Never Used  . Alcohol use No    Family history is   Family History  Problem Relation Age of Onset  . Hypertension Mother   . Liver cancer Father   . Hypertension Sister       Review of Systems  DATA OBTAINED: from patient, nurse GENERAL:  no fevers, fatigue, appetite changes SKIN: No itching, rash or wounds EYES: No eye pain, redness, discharge EARS: No earache, tinnitus, change in hearing NOSE: No congestion, drainage or bleeding  MOUTH/THROAT: No mouth or tooth pain, No sore throat RESPIRATORY: No cough, wheezing, SOB CARDIAC: No chest pain, palpitations, lower extremity edema  GI: No abdominal pain, No N/V/D or constipation, No heartburn or reflux  GU: No dysuria, frequency or urgency, or incontinence  MUSCULOSKELETAL: bone/joint pain is tolerable NEUROLOGIC: No headache, dizziness or focal weakness PSYCHIATRIC: No c/o anxiety or sadness   Vitals:   01/06/16 1235  BP: 113/78  Pulse: 79  Resp: 18  Temp: 99.5 F (37.5 C)    SpO2 Readings from Last 1 Encounters:  01/05/16 99%        Physical Exam  GENERAL APPEARANCE: Alert, conversant,  No acute distress.  SKIN: No diaphoresis rash HEAD: Normocephalic, atraumatic  EYES: Conjunctiva/lids clear. Pupils round, reactive. EOMs intact.  EARS: External exam WNL, canals clear. Hearing grossly normal.  NOSE: No deformity or discharge.  MOUTH/THROAT: Lips w/o lesions  RESPIRATORY: Breathing is even, unlabored. Lung sounds are clear   CARDIOVASCULAR: Heart RRR no murmurs, rubs or gallops. No peripheral edema.   GASTROINTESTINAL: Abdomen is soft, non-tender, not distended w/ normal bowel sounds. GENITOURINARY: Bladder non tender, not  distended  MUSCULOSKELETAL: No abnormal joints or musculature NEUROLOGIC:  Cranial nerves 2-12 grossly intact; weakness BLE PSYCHIATRIC: Mood and affect appropriate to situation, no behavioral issues  Patient Active Problem List   Diagnosis Date Noted  . Vitamin D deficiency 01/11/2016  . GERD (gastroesophageal reflux disease) 01/11/2016  . Spinal stenosis, lumbar region, with neurogenic claudication 01/10/2016  . Spinal stenosis of lumbar region with radiculopathy 01/10/2016  . Fever 01/01/2016  . Diabetes mellitus, type 2 ([place]) 01/01/2016  . Acute blood loss anemia 01/01/2016  . Elective surgery   . Post-operative pain   . Tachycardia   . AKI (acute kidney injury) ([place])   . Prediabetes   . Spondylolisthesis of lumbar region 12/30/2015  . CTS (  carpal tunnel syndrome) 09/26/2015  . Lumbar stenosis 09/26/2015  . Weakness 09/05/2015  . Spinal stenosis in cervical region 06/12/2015  . Leukocytopenia 05/22/2013  . History of spinal cord injury 12/12/2011  . Paraplegia ([place]) 12/11/2011  . Altered mental status 12/11/2011  . DM (diabetes mellitus), type 2, uncontrolled with complications ([place]) 99991111  . Urinary retention 12/11/2011  . MS (multiple sclerosis) ([place])   . Hypertension        Component Value Date/Time   WBC 5.0 01/05/2016 0343   RBC 2.72 (L) 01/05/2016 0343   HGB 8.0 (L) 01/05/2016 0343   HGB 10.9 (L) 07/09/2014 0820   HCT 25.3 (L) 01/05/2016 0343   HCT 33.5 (L) 07/09/2014 0820   PLT 286 01/05/2016 0343   PLT 276 07/09/2014 0820   MCV 93.0 01/05/2016 0343   MCV 95.7 07/09/2014 0820   LYMPHSABS 0.7 01/05/2016 0343   LYMPHSABS 0.5 (L) 07/09/2014 0820   MONOABS 0.3 01/05/2016 0343   MONOABS 0.2 07/09/2014 0820   EOSABS 0.1 01/05/2016 0343   EOSABS 0.1 07/09/2014 0820   BASOSABS 0.0 01/05/2016 0343   BASOSABS 0.0 07/09/2014 0820        Component Value Date/Time   NA 139 01/03/2016 0517   NA 144 01/04/2014 0943   K 3.8 01/03/2016 0517   K 3.8  01/04/2014 0943   CL 103 01/03/2016 0517   CO2 29 01/03/2016 0517   CO2 28 01/04/2014 0943   GLUCOSE 101 (H) 01/03/2016 0517   GLUCOSE 113 01/04/2014 0943   BUN 17 01/03/2016 0517   BUN 18.1 01/04/2014 0943   CREATININE 1.07 01/03/2016 0517   CREATININE 1.0 01/04/2014 0943   CALCIUM 8.7 (L) 01/03/2016 0517   CALCIUM 10.2 01/04/2014 0943   PROT 5.6 (L) 01/01/2016 0956   PROT 7.4 01/04/2014 0943   ALBUMIN 3.4 (L) 01/01/2016 0956   ALBUMIN 4.6 01/04/2014 0943   AST 36 01/01/2016 0956   AST 30 01/04/2014 0943   ALT 16 (L) 01/01/2016 0956   ALT 17 01/04/2014 0943   ALKPHOS 40 01/01/2016 0956   ALKPHOS 64 01/04/2014 0943   BILITOT 0.6 01/01/2016 0956   BILITOT 0.41 01/04/2014 0943   GFRNONAA >60 01/03/2016 0517   GFRNONAA >89 12/25/2011 1132   GFRAA >60 01/03/2016 0517   GFRAA >89 12/25/2011 1132    Lab Results  Component Value Date   HGBA1C 6.0 (H) 12/16/2015    Lab Results  Component Value Date   CHOL 167 09/24/2015   HDL 48.50 09/24/2015   LDLCALC 101 (H) 09/24/2015   TRIG 85.0 09/24/2015   CHOLHDL 3 09/24/2015     No results found.  Not all labs, radiology exams or other studies done during hospitalization come through on my EPIC note; however they are reviewed by me.    Assessment and Plan  SPINAL STENOSIS LUMBAR REGION WITH NEUROGENIC CLAUDICATION AND RADICULOPATHY/ SPONDYLOLITHESIS OF LUMBAR REGION - s/p bilateral decompression of L 4-5 and L5-S1 SNF - ADMITTED FOR OT/PT  S/P T10 SPINAL CORD INJURY WITH PARAPESIS SNF - ongoing OT/PT  MS-  SNF - cont Beta-1a interferon, meloxicam 7.5 mg BID, baclofen 20 mg BID and 40 mg qHS, aleve 440 mg daily and voltarin 75 mg prn with tramadol 50 mg qHS for severe pain  Hypertension SNF - stable; cont olmesartan 40-norvasc 10- HCTZ 12.5  Daily, and clonidine 0.1 BID  Diabetes mellitus, type 2 (HCC) SNF - Stable ;A1c 7/17 6.0; plan cont glucophage 1000 mg BID and actos 30 mg  daily; on ARB, not on statin  Urinary  retention SNF - cont vesicare 10 mg daily  Acute blood loss anemia SNF - D/c Hb after transfusion is 8.0; will f/u CBC; iron was low; will start Fe SO4 325 mg daily  Vitamin D deficiency SNF - cont replacement 1000 u daily  GERD (gastroesophageal reflux disease) SNF - cont prilosec 20 mg daily; he needs it with all the NSAIDs he is taking  AKI (acute kidney injury) ([place]) SNF - d/c Cr 1.07; will f/u BMP    Time spent > 45 min;> 50% of time with patient was spent reviewing records, labs, tests and studies, counseling and developing plan of care  Webb Silversmith D. Sheppard Coil, MD

## 2016-01-08 ENCOUNTER — Other Ambulatory Visit: Payer: Self-pay | Admitting: Family Medicine

## 2016-01-08 MED ORDER — INTERFERON BETA-1A 30 MCG/0.5ML IM AJKT: 30.0000 ug | AUTO-INJECTOR | INTRAMUSCULAR | 3 refills | Status: DC

## 2016-01-10 DIAGNOSIS — M48061 Spinal stenosis, lumbar region without neurogenic claudication: Secondary | ICD-10-CM | POA: Insufficient documentation

## 2016-01-10 DIAGNOSIS — M48062 Spinal stenosis, lumbar region with neurogenic claudication: Secondary | ICD-10-CM | POA: Insufficient documentation

## 2016-01-10 DIAGNOSIS — M5416 Radiculopathy, lumbar region: Secondary | ICD-10-CM

## 2016-01-11 DIAGNOSIS — K219 Gastro-esophageal reflux disease without esophagitis: Secondary | ICD-10-CM | POA: Insufficient documentation

## 2016-01-11 DIAGNOSIS — E559 Vitamin D deficiency, unspecified: Secondary | ICD-10-CM | POA: Insufficient documentation

## 2016-01-11 NOTE — Assessment & Plan Note (Signed)
SNF - cont prilosec 20 mg daily; he needs it with all the NSAIDs he is taking

## 2016-01-11 NOTE — Assessment & Plan Note (Signed)
SNF - D/c Hb after transfusion is 8.0; will f/u CBC; iron was low; will start Fe SO4 325 mg daily

## 2016-01-11 NOTE — Assessment & Plan Note (Signed)
SNF - stable; cont olmesartan 40-norvasc 10- HCTZ 12.5  Daily, and clonidine 0.1 BID

## 2016-01-11 NOTE — Assessment & Plan Note (Signed)
SNF - d/c Cr 1.07; will f/u BMP

## 2016-01-11 NOTE — Assessment & Plan Note (Addendum)
SNF - Stable ;A1c 7/17 6.0; plan cont glucophage 1000 mg BID and actos 30 mg daily; on ARB, not on statin

## 2016-01-11 NOTE — Assessment & Plan Note (Signed)
SNF - cont vesicare 10 mg daily

## 2016-01-11 NOTE — Assessment & Plan Note (Signed)
SNF - cont replacement 1000 u daily

## 2016-01-13 ENCOUNTER — Ambulatory Visit: Payer: Medicare Other | Admitting: Neurology

## 2016-01-15 DIAGNOSIS — R338 Other retention of urine: Secondary | ICD-10-CM | POA: Diagnosis not present

## 2016-01-17 DIAGNOSIS — R338 Other retention of urine: Secondary | ICD-10-CM | POA: Diagnosis not present

## 2016-01-20 ENCOUNTER — Encounter: Payer: Self-pay | Admitting: Internal Medicine

## 2016-01-20 ENCOUNTER — Non-Acute Institutional Stay (SKILLED_NURSING_FACILITY): Payer: Medicare Other | Admitting: Internal Medicine

## 2016-01-20 DIAGNOSIS — N179 Acute kidney failure, unspecified: Secondary | ICD-10-CM | POA: Diagnosis not present

## 2016-01-20 DIAGNOSIS — M5416 Radiculopathy, lumbar region: Secondary | ICD-10-CM

## 2016-01-20 DIAGNOSIS — M48062 Spinal stenosis, lumbar region with neurogenic claudication: Secondary | ICD-10-CM

## 2016-01-20 DIAGNOSIS — M4806 Spinal stenosis, lumbar region: Secondary | ICD-10-CM

## 2016-01-20 DIAGNOSIS — E559 Vitamin D deficiency, unspecified: Secondary | ICD-10-CM

## 2016-01-20 DIAGNOSIS — E119 Type 2 diabetes mellitus without complications: Secondary | ICD-10-CM | POA: Diagnosis not present

## 2016-01-20 DIAGNOSIS — Z87828 Personal history of other (healed) physical injury and trauma: Secondary | ICD-10-CM

## 2016-01-20 DIAGNOSIS — D62 Acute posthemorrhagic anemia: Secondary | ICD-10-CM

## 2016-01-20 DIAGNOSIS — G35 Multiple sclerosis: Secondary | ICD-10-CM | POA: Diagnosis not present

## 2016-01-20 DIAGNOSIS — R339 Retention of urine, unspecified: Secondary | ICD-10-CM

## 2016-01-20 DIAGNOSIS — K219 Gastro-esophageal reflux disease without esophagitis: Secondary | ICD-10-CM | POA: Diagnosis not present

## 2016-01-20 DIAGNOSIS — I152 Hypertension secondary to endocrine disorders: Secondary | ICD-10-CM

## 2016-01-20 DIAGNOSIS — G8220 Paraplegia, unspecified: Secondary | ICD-10-CM

## 2016-01-20 DIAGNOSIS — Z8669 Personal history of other diseases of the nervous system and sense organs: Secondary | ICD-10-CM

## 2016-01-20 DIAGNOSIS — Z419 Encounter for procedure for purposes other than remedying health state, unspecified: Secondary | ICD-10-CM | POA: Diagnosis not present

## 2016-01-20 DIAGNOSIS — M48061 Spinal stenosis, lumbar region without neurogenic claudication: Secondary | ICD-10-CM

## 2016-01-20 DIAGNOSIS — M4316 Spondylolisthesis, lumbar region: Secondary | ICD-10-CM | POA: Diagnosis not present

## 2016-01-20 NOTE — Progress Notes (Signed)
MRN: [id number] Name: Larry [name]  Sex: male Age: 65 y.o. DOB: 08/03/1950  [place] #:  Facility/Room:[facility] Level Of Care: SNF Provider: Inocencio Homes D Emergency Contacts: Extended Emergency Contact Information Primary Emergency Contact: Suella Grove, [place] [address] [place] Phone: [phone] Mobile Phone: [phone] Relation: Daughter  Code Status:   Allergies: No known allergies  No chief complaint on file.   HPI: Patient is 65 y.o. male who was admitted to [facility] from 7/31-8/5 for surgical decompression arthrodesis at L5-S1 for severe lumbar stenosis with neurogenic claudication and lumbar radiculopathy at L5-S1 level. Pt was admitted to SNF for OT/PT and is now ready to go home.  Past Medical History:  Diagnosis Date  . Abnormality of gait   . Acute delirium 12/11/2011  . CTS (carpal tunnel syndrome) 09/26/2015  . Diabetes mellitus   . Hypercholesterolemia   . Hypertension   . Kidney stones   . Leukocytopenia 05/22/2013  . MS (multiple sclerosis) [facility]    sees [facility]  . T10 spinal cord injury ([place]) 12/12/2011   hx surgery 1996 with bilat gait abnormality since    Past Surgical History:  Procedure Laterality Date  . BACK SURGERY    . COLONOSCOPY    . LUMBAR LAMINECTOMY        Medication List       Accurate as of 01/20/16 12:32 PM. Always use your most recent med list.          ACCU-CHEK AVIVA PLUS test strip Generic drug:  glucose blood Test once daily   ACCU-CHEK AVIVA Soln   accu-chek multiclix lancets Use to check blood glucose  once daily   ALEVE 220 MG tablet Generic drug:  naproxen sodium Take 440 mg by mouth daily as needed.   amitriptyline 25 MG tablet Commonly known as:  ELAVIL Take 1 tablet by mouth at bedtime.   aspirin 81 MG tablet Take 81 mg by mouth daily.   baclofen 20 MG tablet Commonly known as:  LIORESAL Take 20 mg by mouth 3 (three) times daily.   cholecalciferol  1000 units tablet Commonly known as:  VITAMIN D Take 1,000 Units by mouth daily.   cloNIDine 0.1 MG tablet Commonly known as:  CATAPRES Take 1 tablet by mouth two  times daily   diclofenac 75 MG EC tablet Commonly known as:  VOLTAREN Take 75 mg by mouth daily as needed.   gabapentin 800 MG tablet Commonly known as:  NEURONTIN Take 1,600 mg by mouth 2 (two) times daily. Pt takes 2 tablets in the morning, 2 tablets at bedtime   Interferon Beta-1a 30 MCG/0.5ML Ajkt Inject 30 mcg into the muscle once a week. On Sunday   meloxicam 7.5 MG tablet Commonly known as:  MOBIC Take 1 tablet (7.5 mg total) by mouth 2 (two) times daily.   metFORMIN 1000 MG tablet Commonly known as:  GLUCOPHAGE Take 1 tablet by mouth two  times daily with meals   multivitamin tablet Take 1 tablet by mouth daily.   Olmesartan-Amlodipine-HCTZ 40-10-12.5 MG Tabs Commonly known as:  TRIBENZOR Take 1 tablet by mouth  daily   omeprazole 20 MG capsule Commonly known as:  PRILOSEC Take 1 capsule (20 mg total) by mouth daily.   pioglitazone 30 MG tablet Commonly known as:  ACTOS Take 1 tablet by mouth  every day   traMADol 50 MG tablet Commonly known as:  ULTRAM Take 1 tablet (50 mg total)  by mouth at bedtime as needed for severe pain. for pain   traZODone 50 MG tablet Commonly known as:  DESYREL Take 1 tablet (50 mg total) by mouth at bedtime.   VESICARE 10 MG tablet Generic drug:  solifenacin Take 1 tablet by mouth  daily   VITAMIN B-12 PO Take 1 tablet by mouth 2 (two) times daily. Patient is unsure of dose       No orders of the defined types were placed in this encounter.   Immunization History  Administered Date(s) Administered  . Influenza Split 02/05/2012  . Influenza, High Dose Seasonal PF 03/05/2014, 05/17/2015  . Influenza,inj,Quad PF,36+ Mos 05/09/2013  . PPD Test 01/05/2016  . Pneumococcal Conjugate-13 03/05/2014  . Pneumococcal Polysaccharide-23 05/17/2015    Social  History  Substance Use Topics  . Smoking status: Never Smoker  . Smokeless tobacco: Never Used  . Alcohol use No    Vitals:   01/20/16 1209  BP: 124/79  Pulse: 77  Resp: 16  Temp: 97.4 F (36.3 C)    Physical Exam  GENERAL APPEARANCE: Alert, conversant. No acute distress.  HEENT: Unremarkable. RESPIRATORY: Breathing is even, unlabored. Lung sounds are clear   CARDIOVASCULAR: Heart RRR no murmurs, rubs or gallops. No peripheral edema.  GASTROINTESTINAL: Abdomen is soft, non-tender, not distended w/ normal bowel sounds.  NEUROLOGIC: Cranial nerves 2-12 grossly intact. Moves all extremities  Patient Active Problem List   Diagnosis Date Noted  . Vitamin D deficiency 01/11/2016  . GERD (gastroesophageal reflux disease) 01/11/2016  . Spinal stenosis, lumbar region, with neurogenic claudication 01/10/2016  . Spinal stenosis of lumbar region with radiculopathy 01/10/2016  . Fever 01/01/2016  . Diabetes mellitus, type 2 ([place]) 01/01/2016  . Acute blood loss anemia 01/01/2016  . Elective surgery   . Post-operative pain   . Tachycardia   . AKI (acute kidney injury) ([place])   . Prediabetes   . Spondylolisthesis of lumbar region 12/30/2015  . CTS (carpal tunnel syndrome) 09/26/2015  . Lumbar stenosis 09/26/2015  . Weakness 09/05/2015  . Spinal stenosis in cervical region 06/12/2015  . Leukocytopenia 05/22/2013  . History of spinal cord injury 12/12/2011  . Paraplegia ([place]) 12/11/2011  . Altered mental status 12/11/2011  . DM (diabetes mellitus), type 2, uncontrolled with complications ([place]) 99991111  . Urinary retention 12/11/2011  . MS (multiple sclerosis) ([place])   . Hypertension     CBC    Component Value Date/Time   WBC 5.0 01/05/2016 0343   RBC 2.72 (L) 01/05/2016 0343   HGB 8.0 (L) 01/05/2016 0343   HGB 10.9 (L) 07/09/2014 0820   HCT 25.3 (L) 01/05/2016 0343   HCT 33.5 (L) 07/09/2014 0820   PLT 286 01/05/2016 0343   PLT 276 07/09/2014 0820   MCV 93.0 01/05/2016  0343   MCV 95.7 07/09/2014 0820   LYMPHSABS 0.7 01/05/2016 0343   LYMPHSABS 0.5 (L) 07/09/2014 0820   MONOABS 0.3 01/05/2016 0343   MONOABS 0.2 07/09/2014 0820   EOSABS 0.1 01/05/2016 0343   EOSABS 0.1 07/09/2014 0820   BASOSABS 0.0 01/05/2016 0343   BASOSABS 0.0 07/09/2014 0820    CMP     Component Value Date/Time   NA 139 01/03/2016 0517   NA 144 01/04/2014 0943   K 3.8 01/03/2016 0517   K 3.8 01/04/2014 0943   CL 103 01/03/2016 0517   CO2 29 01/03/2016 0517   CO2 28 01/04/2014 0943   GLUCOSE 101 (H) 01/03/2016 0517   GLUCOSE 113 01/04/2014 0943  BUN 17 01/03/2016 0517   BUN 18.1 01/04/2014 0943   CREATININE 1.07 01/03/2016 0517   CREATININE 1.0 01/04/2014 0943   CALCIUM 8.7 (L) 01/03/2016 0517   CALCIUM 10.2 01/04/2014 0943   PROT 5.6 (L) 01/01/2016 0956   PROT 7.4 01/04/2014 0943   ALBUMIN 3.4 (L) 01/01/2016 0956   ALBUMIN 4.6 01/04/2014 0943   AST 36 01/01/2016 0956   AST 30 01/04/2014 0943   ALT 16 (L) 01/01/2016 0956   ALT 17 01/04/2014 0943   ALKPHOS 40 01/01/2016 0956   ALKPHOS 64 01/04/2014 0943   BILITOT 0.6 01/01/2016 0956   BILITOT 0.41 01/04/2014 0943   GFRNONAA >60 01/03/2016 0517   GFRNONAA >89 12/25/2011 1132   GFRAA >60 01/03/2016 0517   GFRAA >89 12/25/2011 1132    Assessment and Plan  Pt is d/c to home with HH/OT/PT/Nursing. Medications have been reconciled and Rx's written.   Time spent > 30 min;> 50% of time with patient was spent reviewing records, labs, tests and studies, counseling and developing plan of care  Hennie Duos, MD

## 2016-01-24 DIAGNOSIS — M5416 Radiculopathy, lumbar region: Secondary | ICD-10-CM | POA: Diagnosis not present

## 2016-01-24 DIAGNOSIS — M4802 Spinal stenosis, cervical region: Secondary | ICD-10-CM | POA: Diagnosis not present

## 2016-01-24 DIAGNOSIS — M4806 Spinal stenosis, lumbar region: Secondary | ICD-10-CM | POA: Diagnosis not present

## 2016-01-24 DIAGNOSIS — E559 Vitamin D deficiency, unspecified: Secondary | ICD-10-CM | POA: Diagnosis not present

## 2016-01-24 DIAGNOSIS — Z4789 Encounter for other orthopedic aftercare: Secondary | ICD-10-CM | POA: Diagnosis not present

## 2016-01-24 DIAGNOSIS — I10 Essential (primary) hypertension: Secondary | ICD-10-CM | POA: Diagnosis not present

## 2016-01-24 DIAGNOSIS — G35 Multiple sclerosis: Secondary | ICD-10-CM | POA: Diagnosis not present

## 2016-01-24 DIAGNOSIS — Z7984 Long term (current) use of oral hypoglycemic drugs: Secondary | ICD-10-CM | POA: Diagnosis not present

## 2016-01-24 DIAGNOSIS — R600 Localized edema: Secondary | ICD-10-CM | POA: Diagnosis not present

## 2016-01-24 DIAGNOSIS — Z7982 Long term (current) use of aspirin: Secondary | ICD-10-CM | POA: Diagnosis not present

## 2016-01-24 DIAGNOSIS — G8220 Paraplegia, unspecified: Secondary | ICD-10-CM | POA: Diagnosis not present

## 2016-01-24 DIAGNOSIS — E119 Type 2 diabetes mellitus without complications: Secondary | ICD-10-CM | POA: Diagnosis not present

## 2016-01-24 DIAGNOSIS — S24103S Unspecified injury at T7-T10 level of thoracic spinal cord, sequela: Secondary | ICD-10-CM | POA: Diagnosis not present

## 2016-01-27 DIAGNOSIS — E119 Type 2 diabetes mellitus without complications: Secondary | ICD-10-CM | POA: Diagnosis not present

## 2016-01-27 DIAGNOSIS — I10 Essential (primary) hypertension: Secondary | ICD-10-CM | POA: Diagnosis not present

## 2016-01-27 DIAGNOSIS — M4806 Spinal stenosis, lumbar region: Secondary | ICD-10-CM | POA: Diagnosis not present

## 2016-01-27 DIAGNOSIS — Z4789 Encounter for other orthopedic aftercare: Secondary | ICD-10-CM | POA: Diagnosis not present

## 2016-01-27 DIAGNOSIS — S24103S Unspecified injury at T7-T10 level of thoracic spinal cord, sequela: Secondary | ICD-10-CM | POA: Diagnosis not present

## 2016-01-27 DIAGNOSIS — Z7984 Long term (current) use of oral hypoglycemic drugs: Secondary | ICD-10-CM | POA: Diagnosis not present

## 2016-01-27 DIAGNOSIS — E559 Vitamin D deficiency, unspecified: Secondary | ICD-10-CM | POA: Diagnosis not present

## 2016-01-27 DIAGNOSIS — M5416 Radiculopathy, lumbar region: Secondary | ICD-10-CM | POA: Diagnosis not present

## 2016-01-27 DIAGNOSIS — R600 Localized edema: Secondary | ICD-10-CM | POA: Diagnosis not present

## 2016-01-27 DIAGNOSIS — G35 Multiple sclerosis: Secondary | ICD-10-CM | POA: Diagnosis not present

## 2016-01-27 DIAGNOSIS — Z7982 Long term (current) use of aspirin: Secondary | ICD-10-CM | POA: Diagnosis not present

## 2016-01-27 DIAGNOSIS — G8220 Paraplegia, unspecified: Secondary | ICD-10-CM | POA: Diagnosis not present

## 2016-01-27 DIAGNOSIS — M4802 Spinal stenosis, cervical region: Secondary | ICD-10-CM | POA: Diagnosis not present

## 2016-01-28 DIAGNOSIS — E119 Type 2 diabetes mellitus without complications: Secondary | ICD-10-CM | POA: Diagnosis not present

## 2016-01-28 DIAGNOSIS — R600 Localized edema: Secondary | ICD-10-CM | POA: Diagnosis not present

## 2016-01-28 DIAGNOSIS — M4806 Spinal stenosis, lumbar region: Secondary | ICD-10-CM | POA: Diagnosis not present

## 2016-01-28 DIAGNOSIS — G35 Multiple sclerosis: Secondary | ICD-10-CM | POA: Diagnosis not present

## 2016-01-28 DIAGNOSIS — I10 Essential (primary) hypertension: Secondary | ICD-10-CM | POA: Diagnosis not present

## 2016-01-28 DIAGNOSIS — Z4789 Encounter for other orthopedic aftercare: Secondary | ICD-10-CM | POA: Diagnosis not present

## 2016-01-28 DIAGNOSIS — Z7984 Long term (current) use of oral hypoglycemic drugs: Secondary | ICD-10-CM | POA: Diagnosis not present

## 2016-01-28 DIAGNOSIS — E559 Vitamin D deficiency, unspecified: Secondary | ICD-10-CM | POA: Diagnosis not present

## 2016-01-28 DIAGNOSIS — G8220 Paraplegia, unspecified: Secondary | ICD-10-CM | POA: Diagnosis not present

## 2016-01-28 DIAGNOSIS — M5416 Radiculopathy, lumbar region: Secondary | ICD-10-CM | POA: Diagnosis not present

## 2016-01-28 DIAGNOSIS — S24103S Unspecified injury at T7-T10 level of thoracic spinal cord, sequela: Secondary | ICD-10-CM | POA: Diagnosis not present

## 2016-01-28 DIAGNOSIS — M4802 Spinal stenosis, cervical region: Secondary | ICD-10-CM | POA: Diagnosis not present

## 2016-01-28 DIAGNOSIS — Z7982 Long term (current) use of aspirin: Secondary | ICD-10-CM | POA: Diagnosis not present

## 2016-01-30 DIAGNOSIS — M4317 Spondylolisthesis, lumbosacral region: Secondary | ICD-10-CM | POA: Diagnosis not present

## 2016-01-31 DIAGNOSIS — M5416 Radiculopathy, lumbar region: Secondary | ICD-10-CM | POA: Diagnosis not present

## 2016-01-31 DIAGNOSIS — G35 Multiple sclerosis: Secondary | ICD-10-CM | POA: Diagnosis not present

## 2016-01-31 DIAGNOSIS — R600 Localized edema: Secondary | ICD-10-CM | POA: Diagnosis not present

## 2016-01-31 DIAGNOSIS — G8220 Paraplegia, unspecified: Secondary | ICD-10-CM | POA: Diagnosis not present

## 2016-01-31 DIAGNOSIS — E119 Type 2 diabetes mellitus without complications: Secondary | ICD-10-CM | POA: Diagnosis not present

## 2016-01-31 DIAGNOSIS — M4806 Spinal stenosis, lumbar region: Secondary | ICD-10-CM | POA: Diagnosis not present

## 2016-01-31 DIAGNOSIS — E559 Vitamin D deficiency, unspecified: Secondary | ICD-10-CM | POA: Diagnosis not present

## 2016-01-31 DIAGNOSIS — M4802 Spinal stenosis, cervical region: Secondary | ICD-10-CM | POA: Diagnosis not present

## 2016-01-31 DIAGNOSIS — Z7982 Long term (current) use of aspirin: Secondary | ICD-10-CM | POA: Diagnosis not present

## 2016-01-31 DIAGNOSIS — Z7984 Long term (current) use of oral hypoglycemic drugs: Secondary | ICD-10-CM | POA: Diagnosis not present

## 2016-01-31 DIAGNOSIS — Z4789 Encounter for other orthopedic aftercare: Secondary | ICD-10-CM | POA: Diagnosis not present

## 2016-01-31 DIAGNOSIS — S24103S Unspecified injury at T7-T10 level of thoracic spinal cord, sequela: Secondary | ICD-10-CM | POA: Diagnosis not present

## 2016-01-31 DIAGNOSIS — I10 Essential (primary) hypertension: Secondary | ICD-10-CM | POA: Diagnosis not present

## 2016-02-04 DIAGNOSIS — Z7984 Long term (current) use of oral hypoglycemic drugs: Secondary | ICD-10-CM | POA: Diagnosis not present

## 2016-02-04 DIAGNOSIS — G8220 Paraplegia, unspecified: Secondary | ICD-10-CM | POA: Diagnosis not present

## 2016-02-04 DIAGNOSIS — S24103S Unspecified injury at T7-T10 level of thoracic spinal cord, sequela: Secondary | ICD-10-CM | POA: Diagnosis not present

## 2016-02-04 DIAGNOSIS — M5416 Radiculopathy, lumbar region: Secondary | ICD-10-CM | POA: Diagnosis not present

## 2016-02-04 DIAGNOSIS — M4806 Spinal stenosis, lumbar region: Secondary | ICD-10-CM | POA: Diagnosis not present

## 2016-02-04 DIAGNOSIS — M4802 Spinal stenosis, cervical region: Secondary | ICD-10-CM | POA: Diagnosis not present

## 2016-02-04 DIAGNOSIS — E119 Type 2 diabetes mellitus without complications: Secondary | ICD-10-CM | POA: Diagnosis not present

## 2016-02-04 DIAGNOSIS — Z7982 Long term (current) use of aspirin: Secondary | ICD-10-CM | POA: Diagnosis not present

## 2016-02-04 DIAGNOSIS — I10 Essential (primary) hypertension: Secondary | ICD-10-CM | POA: Diagnosis not present

## 2016-02-04 DIAGNOSIS — Z4789 Encounter for other orthopedic aftercare: Secondary | ICD-10-CM | POA: Diagnosis not present

## 2016-02-04 DIAGNOSIS — R338 Other retention of urine: Secondary | ICD-10-CM | POA: Diagnosis not present

## 2016-02-04 DIAGNOSIS — G35 Multiple sclerosis: Secondary | ICD-10-CM | POA: Diagnosis not present

## 2016-02-04 DIAGNOSIS — E559 Vitamin D deficiency, unspecified: Secondary | ICD-10-CM | POA: Diagnosis not present

## 2016-02-04 DIAGNOSIS — R600 Localized edema: Secondary | ICD-10-CM | POA: Diagnosis not present

## 2016-02-06 ENCOUNTER — Ambulatory Visit: Payer: Medicare Other | Admitting: Family Medicine

## 2016-02-06 DIAGNOSIS — G8220 Paraplegia, unspecified: Secondary | ICD-10-CM | POA: Diagnosis not present

## 2016-02-06 DIAGNOSIS — R600 Localized edema: Secondary | ICD-10-CM | POA: Diagnosis not present

## 2016-02-06 DIAGNOSIS — Z7984 Long term (current) use of oral hypoglycemic drugs: Secondary | ICD-10-CM | POA: Diagnosis not present

## 2016-02-06 DIAGNOSIS — S24103S Unspecified injury at T7-T10 level of thoracic spinal cord, sequela: Secondary | ICD-10-CM | POA: Diagnosis not present

## 2016-02-06 DIAGNOSIS — G35 Multiple sclerosis: Secondary | ICD-10-CM | POA: Diagnosis not present

## 2016-02-06 DIAGNOSIS — M4802 Spinal stenosis, cervical region: Secondary | ICD-10-CM | POA: Diagnosis not present

## 2016-02-06 DIAGNOSIS — E559 Vitamin D deficiency, unspecified: Secondary | ICD-10-CM | POA: Diagnosis not present

## 2016-02-06 DIAGNOSIS — Z4789 Encounter for other orthopedic aftercare: Secondary | ICD-10-CM | POA: Diagnosis not present

## 2016-02-06 DIAGNOSIS — Z7982 Long term (current) use of aspirin: Secondary | ICD-10-CM | POA: Diagnosis not present

## 2016-02-06 DIAGNOSIS — E119 Type 2 diabetes mellitus without complications: Secondary | ICD-10-CM | POA: Diagnosis not present

## 2016-02-06 DIAGNOSIS — I10 Essential (primary) hypertension: Secondary | ICD-10-CM | POA: Diagnosis not present

## 2016-02-06 DIAGNOSIS — M5416 Radiculopathy, lumbar region: Secondary | ICD-10-CM | POA: Diagnosis not present

## 2016-02-06 DIAGNOSIS — M4806 Spinal stenosis, lumbar region: Secondary | ICD-10-CM | POA: Diagnosis not present

## 2016-02-07 DIAGNOSIS — M5416 Radiculopathy, lumbar region: Secondary | ICD-10-CM | POA: Diagnosis not present

## 2016-02-07 DIAGNOSIS — Z4789 Encounter for other orthopedic aftercare: Secondary | ICD-10-CM | POA: Diagnosis not present

## 2016-02-07 DIAGNOSIS — E119 Type 2 diabetes mellitus without complications: Secondary | ICD-10-CM | POA: Diagnosis not present

## 2016-02-07 DIAGNOSIS — S24103S Unspecified injury at T7-T10 level of thoracic spinal cord, sequela: Secondary | ICD-10-CM | POA: Diagnosis not present

## 2016-02-07 DIAGNOSIS — R600 Localized edema: Secondary | ICD-10-CM | POA: Diagnosis not present

## 2016-02-07 DIAGNOSIS — G8220 Paraplegia, unspecified: Secondary | ICD-10-CM | POA: Diagnosis not present

## 2016-02-07 DIAGNOSIS — Z7984 Long term (current) use of oral hypoglycemic drugs: Secondary | ICD-10-CM | POA: Diagnosis not present

## 2016-02-07 DIAGNOSIS — M4802 Spinal stenosis, cervical region: Secondary | ICD-10-CM | POA: Diagnosis not present

## 2016-02-07 DIAGNOSIS — Z7982 Long term (current) use of aspirin: Secondary | ICD-10-CM | POA: Diagnosis not present

## 2016-02-07 DIAGNOSIS — G35 Multiple sclerosis: Secondary | ICD-10-CM | POA: Diagnosis not present

## 2016-02-07 DIAGNOSIS — M4806 Spinal stenosis, lumbar region: Secondary | ICD-10-CM | POA: Diagnosis not present

## 2016-02-07 DIAGNOSIS — E559 Vitamin D deficiency, unspecified: Secondary | ICD-10-CM | POA: Diagnosis not present

## 2016-02-07 DIAGNOSIS — I10 Essential (primary) hypertension: Secondary | ICD-10-CM | POA: Diagnosis not present

## 2016-02-11 DIAGNOSIS — I10 Essential (primary) hypertension: Secondary | ICD-10-CM | POA: Diagnosis not present

## 2016-02-11 DIAGNOSIS — E559 Vitamin D deficiency, unspecified: Secondary | ICD-10-CM | POA: Diagnosis not present

## 2016-02-11 DIAGNOSIS — Z7984 Long term (current) use of oral hypoglycemic drugs: Secondary | ICD-10-CM | POA: Diagnosis not present

## 2016-02-11 DIAGNOSIS — M4802 Spinal stenosis, cervical region: Secondary | ICD-10-CM | POA: Diagnosis not present

## 2016-02-11 DIAGNOSIS — G8220 Paraplegia, unspecified: Secondary | ICD-10-CM | POA: Diagnosis not present

## 2016-02-11 DIAGNOSIS — Z4789 Encounter for other orthopedic aftercare: Secondary | ICD-10-CM | POA: Diagnosis not present

## 2016-02-11 DIAGNOSIS — G35 Multiple sclerosis: Secondary | ICD-10-CM | POA: Diagnosis not present

## 2016-02-11 DIAGNOSIS — E119 Type 2 diabetes mellitus without complications: Secondary | ICD-10-CM | POA: Diagnosis not present

## 2016-02-11 DIAGNOSIS — S24103S Unspecified injury at T7-T10 level of thoracic spinal cord, sequela: Secondary | ICD-10-CM | POA: Diagnosis not present

## 2016-02-11 DIAGNOSIS — R600 Localized edema: Secondary | ICD-10-CM | POA: Diagnosis not present

## 2016-02-11 DIAGNOSIS — M4806 Spinal stenosis, lumbar region: Secondary | ICD-10-CM | POA: Diagnosis not present

## 2016-02-11 DIAGNOSIS — M5416 Radiculopathy, lumbar region: Secondary | ICD-10-CM | POA: Diagnosis not present

## 2016-02-11 DIAGNOSIS — Z7982 Long term (current) use of aspirin: Secondary | ICD-10-CM | POA: Diagnosis not present

## 2016-02-13 ENCOUNTER — Other Ambulatory Visit: Payer: Self-pay | Admitting: Family

## 2016-02-13 DIAGNOSIS — E559 Vitamin D deficiency, unspecified: Secondary | ICD-10-CM | POA: Diagnosis not present

## 2016-02-13 DIAGNOSIS — Z7984 Long term (current) use of oral hypoglycemic drugs: Secondary | ICD-10-CM | POA: Diagnosis not present

## 2016-02-13 DIAGNOSIS — M5416 Radiculopathy, lumbar region: Secondary | ICD-10-CM | POA: Diagnosis not present

## 2016-02-13 DIAGNOSIS — M4802 Spinal stenosis, cervical region: Secondary | ICD-10-CM | POA: Diagnosis not present

## 2016-02-13 DIAGNOSIS — S24103S Unspecified injury at T7-T10 level of thoracic spinal cord, sequela: Secondary | ICD-10-CM | POA: Diagnosis not present

## 2016-02-13 DIAGNOSIS — Z4789 Encounter for other orthopedic aftercare: Secondary | ICD-10-CM | POA: Diagnosis not present

## 2016-02-13 DIAGNOSIS — M4806 Spinal stenosis, lumbar region: Secondary | ICD-10-CM | POA: Diagnosis not present

## 2016-02-13 DIAGNOSIS — R600 Localized edema: Secondary | ICD-10-CM | POA: Diagnosis not present

## 2016-02-13 DIAGNOSIS — I10 Essential (primary) hypertension: Secondary | ICD-10-CM | POA: Diagnosis not present

## 2016-02-13 DIAGNOSIS — G8220 Paraplegia, unspecified: Secondary | ICD-10-CM | POA: Diagnosis not present

## 2016-02-13 DIAGNOSIS — E119 Type 2 diabetes mellitus without complications: Secondary | ICD-10-CM | POA: Diagnosis not present

## 2016-02-13 DIAGNOSIS — Z7982 Long term (current) use of aspirin: Secondary | ICD-10-CM | POA: Diagnosis not present

## 2016-02-13 DIAGNOSIS — G35 Multiple sclerosis: Secondary | ICD-10-CM | POA: Diagnosis not present

## 2016-02-14 NOTE — Telephone Encounter (Signed)
Rx refill sent to pharmacy. 

## 2016-02-18 DIAGNOSIS — R600 Localized edema: Secondary | ICD-10-CM | POA: Diagnosis not present

## 2016-02-18 DIAGNOSIS — Z7982 Long term (current) use of aspirin: Secondary | ICD-10-CM | POA: Diagnosis not present

## 2016-02-18 DIAGNOSIS — G35 Multiple sclerosis: Secondary | ICD-10-CM | POA: Diagnosis not present

## 2016-02-18 DIAGNOSIS — E559 Vitamin D deficiency, unspecified: Secondary | ICD-10-CM | POA: Diagnosis not present

## 2016-02-18 DIAGNOSIS — Z4789 Encounter for other orthopedic aftercare: Secondary | ICD-10-CM | POA: Diagnosis not present

## 2016-02-18 DIAGNOSIS — S24103S Unspecified injury at T7-T10 level of thoracic spinal cord, sequela: Secondary | ICD-10-CM | POA: Diagnosis not present

## 2016-02-18 DIAGNOSIS — G8220 Paraplegia, unspecified: Secondary | ICD-10-CM | POA: Diagnosis not present

## 2016-02-18 DIAGNOSIS — Z7984 Long term (current) use of oral hypoglycemic drugs: Secondary | ICD-10-CM | POA: Diagnosis not present

## 2016-02-18 DIAGNOSIS — M4802 Spinal stenosis, cervical region: Secondary | ICD-10-CM | POA: Diagnosis not present

## 2016-02-18 DIAGNOSIS — M5416 Radiculopathy, lumbar region: Secondary | ICD-10-CM | POA: Diagnosis not present

## 2016-02-18 DIAGNOSIS — M4806 Spinal stenosis, lumbar region: Secondary | ICD-10-CM | POA: Diagnosis not present

## 2016-02-18 DIAGNOSIS — I10 Essential (primary) hypertension: Secondary | ICD-10-CM | POA: Diagnosis not present

## 2016-02-18 DIAGNOSIS — E119 Type 2 diabetes mellitus without complications: Secondary | ICD-10-CM | POA: Diagnosis not present

## 2016-02-24 ENCOUNTER — Telehealth: Payer: Self-pay | Admitting: Family Medicine

## 2016-02-24 ENCOUNTER — Other Ambulatory Visit: Payer: Self-pay | Admitting: Neurology

## 2016-02-24 NOTE — Telephone Encounter (Signed)
I called the pt and informed him of the message below and advised he call his specialist that treats the MS and he agreed.  He asked what doctors here are accepting new pts and I informed him we have Almyra Free and Tommi Rumps as nurse practioners and Dr [place] and Dr Yong Channel and he agreed.

## 2016-02-24 NOTE — Telephone Encounter (Signed)
Pt request refill  Avonex pen [place] (pt states it [facility])

## 2016-02-24 NOTE — Telephone Encounter (Signed)
Please let pharmacy and pt know. This medication is prescribed by his specialist. Thanks.

## 2016-02-25 ENCOUNTER — Telehealth: Payer: Self-pay | Admitting: Family Medicine

## 2016-02-25 NOTE — Telephone Encounter (Signed)
Pt would like to transfer from Dr. Maudie Mercury to Dr. Yong Channel and would like to see if Dr Yong Channel will prescribe Avonex 30 MCG for his MS?  Is it okay to transfer.

## 2016-02-25 NOTE — Telephone Encounter (Signed)
[place] with me, if ok with Larry [name] He is a very nice man. Have not seen him much as recently established care with me. Complicated neuro history - sees several specialist. Thanks.

## 2016-02-25 NOTE — Telephone Encounter (Signed)
I am willing to see him but will not prescribe avonex. This should be done by his prescribing specialist.

## 2016-02-25 NOTE — Telephone Encounter (Signed)
Called pt to let him know what the outcome is and he verbalize he understands he will not get the avonex from either provider and he would have to get it from his specialist. He states he would love to have Dr. Yong Channel as his provider.  Pt did not need to schedule a appointment at this time but will do so in the future.

## 2016-02-27 DIAGNOSIS — Z4789 Encounter for other orthopedic aftercare: Secondary | ICD-10-CM | POA: Diagnosis not present

## 2016-02-27 DIAGNOSIS — E119 Type 2 diabetes mellitus without complications: Secondary | ICD-10-CM | POA: Diagnosis not present

## 2016-02-27 DIAGNOSIS — E559 Vitamin D deficiency, unspecified: Secondary | ICD-10-CM | POA: Diagnosis not present

## 2016-02-27 DIAGNOSIS — R600 Localized edema: Secondary | ICD-10-CM | POA: Diagnosis not present

## 2016-02-27 DIAGNOSIS — M4802 Spinal stenosis, cervical region: Secondary | ICD-10-CM | POA: Diagnosis not present

## 2016-02-27 DIAGNOSIS — Z7984 Long term (current) use of oral hypoglycemic drugs: Secondary | ICD-10-CM | POA: Diagnosis not present

## 2016-02-27 DIAGNOSIS — G35 Multiple sclerosis: Secondary | ICD-10-CM | POA: Diagnosis not present

## 2016-02-27 DIAGNOSIS — S24103S Unspecified injury at T7-T10 level of thoracic spinal cord, sequela: Secondary | ICD-10-CM | POA: Diagnosis not present

## 2016-02-27 DIAGNOSIS — M4806 Spinal stenosis, lumbar region: Secondary | ICD-10-CM | POA: Diagnosis not present

## 2016-02-27 DIAGNOSIS — Z7982 Long term (current) use of aspirin: Secondary | ICD-10-CM | POA: Diagnosis not present

## 2016-02-27 DIAGNOSIS — I10 Essential (primary) hypertension: Secondary | ICD-10-CM | POA: Diagnosis not present

## 2016-02-27 DIAGNOSIS — G8220 Paraplegia, unspecified: Secondary | ICD-10-CM | POA: Diagnosis not present

## 2016-02-27 DIAGNOSIS — M5416 Radiculopathy, lumbar region: Secondary | ICD-10-CM | POA: Diagnosis not present

## 2016-03-03 ENCOUNTER — Ambulatory Visit (INDEPENDENT_AMBULATORY_CARE_PROVIDER_SITE_OTHER): Payer: Medicare Other | Admitting: Neurology

## 2016-03-03 ENCOUNTER — Encounter: Payer: Self-pay | Admitting: Neurology

## 2016-03-03 VITALS — BP 142/86 | HR 86 | Ht 67.0 in | Wt 166.0 lb

## 2016-03-03 DIAGNOSIS — G35 Multiple sclerosis: Secondary | ICD-10-CM

## 2016-03-03 DIAGNOSIS — G8220 Paraplegia, unspecified: Secondary | ICD-10-CM | POA: Diagnosis not present

## 2016-03-03 MED ORDER — LOPERAMIDE HCL 2 MG PO TABS: 2.0000 mg | ORAL_TABLET | ORAL | 11 refills | Status: DC | PRN

## 2016-03-03 MED ORDER — INTERFERON BETA-1A 30 MCG IM KIT: 30.0000 ug | PACK | INTRAMUSCULAR | 4 refills | Status: DC

## 2016-03-03 NOTE — Progress Notes (Signed)
Chief Complaint  Patient presents with  . Multiple Sclerosis    He is has started having problems with bowel incontinence.    Marland Kitchen Spinal Stenosis    He had spinal surgery with Dr. Ellene Route on 12/30/15.  Reports improvement in both his pain and gait.      PATIENT: Larry [name] DOB: 06-20-1950  Chief Complaint  Patient presents with  . Multiple Sclerosis    He is has started having problems with bowel incontinence.    Marland Kitchen Spinal Stenosis    He had spinal surgery with Dr. Ellene Route on 12/30/15.  Reports improvement in both his pain and gait.     HISTORICAL  Larry [name] is a 65 year old male, accompanied by her daughter [name], seen in refer by his primary care physician Dr. Rodena Goldmann for evaluation of worsening gait difficulty, multiple sclerosis  He was a patient of our office in the past, last clinical visit was July 2013, he had a history of diabetes since 2002, also with  diagnosis of relapsing remitting multiple sclerosis, initial presentation was gait difficulty in 1996, he fell few times, he was initially diagnosed with lumbar spinal stenosis, had lumbar spine disc decompression surgery, which fail to improve his symptoms, he continued to have worsening gait difficulty, this eventually led to hospital admission at [facility], the diagnosis of MS was based on abnormal MRI findings, spinal fluid testing, after rehabilitation, he was able to ambulate again with a cane, there was no visual loss, he was started treatment with Avonex since 1996, he moved to [place] in 1999, was under the care of by Dr. Delphia Grates at [facility], was treated with Betaseron for a short period of time, but he could not tolerate it due to injection side effect, went back on Avelox shortly afterwards  over the years, he denies significant relapsing episode, he had slow decline in his walking, has been ambulate with crutches, exercise regularly  He had a history of lumbar  decompression surgery  in 1996 at [facility] with very difficult recovery, he was in wheelchair for 6 months because of gait difficulty.  He presented to the hospital in July 2013 for worsening gait difficulty, had extensive imaging study, I have personally reviewed MRI of the brain in 2013 showed chronic changes, cervical spine with significant cord compression at C4-5 C5-6, but no abnormal cord signal to suggest cervical MS lesions, MRI of thoracic spine showed chronic cord compression and myelomalacia at T10-11, he was evaluated by neurosurgeon, Dr. Ellene Route, the plan was to treat him with short course of steroid, followed by thoracic elective decompression, he has made significant improvement not steroid treatment, has lost follow-up,  Over the past 3 years, he had worsening of his gait difficulty, he is able to walk with crutches, but with worsening bending forward posture and difficulty, recent few months, he also noticed bilateral hands paresthesia. he lives in his own apartment, has 12 steps he denies significant low back pain, no bowel and bladder incontinence, he exercise 3 times each week,  UPDATE April 6th 2017: He was evaluated by Dr. Ellene Route in early March 2017, per patient, he was offered decompression surgery for cervical and thoracic stenosis, he also had MRI of lumbar He is now complaining of worsening right low back pain, radiating pain to right hip and right thigh,   He also began to notice bilateral hands numbness weakness since 2003, gradually getting worse, is very difficult for him to button his shirt  I have personally reviewed MRI of lumbar in March 2017: Multilevel degenerative changes, most severe at L4-5 multifactorial severe spinal stenosis lateral recess stenosis. Marked foraminal narrowing greater on the right  UPDATE Mar 03 2016:  He had L4-5 decompressive laminectomy, decompression of  L4-5 nerve roots, posterior lumbar interbody arthrodeses with autograft and  allograft by Dr. Ellene Route on December 30 2015, he responded very well, he no longer has significant right low back pain radiating to his right hip, his posture has improved, he can move better,  However since surgery, he noticed increasing incident of bowel incontinence, couple times each week, he denies significant problem with his bladder  We again reviewed presurgical MRI in March 2017, multilevel lumbar degenerative disc disease, L4-5 severe spinal stenosis, lateral recess stenosis, marked foraminal narrowing greater on the right, MRI of thoracic spine moderate to severe spinal stenosis at T10-11, severe bilateral foraminal stenosis, T2 hyper intensity cord signal, facet hypertrophy at T9-10, with severe bilateral foraminal stenosis, MRI of cervical spine, Mild spinal canal stenosis severe bilateral foraminal stenosis at C4-5, C5-6 and C6-7    REVIEW OF SYSTEMS: Full 14 system review of systems performed and notable only for: Walking difficulty  ALLERGIES: Allergies  Allergen Reactions  . No Known Allergies     HOME MEDICATIONS: Current Outpatient Prescriptions  Medication Sig Dispense Refill  . ACCU-CHEK AVIVA PLUS test strip Test once daily 100 each 3  . amitriptyline (ELAVIL) 25 MG tablet Take 1 tablet by mouth at bedtime.    Marland Kitchen aspirin 81 MG tablet Take 81 mg by mouth daily.    . baclofen (LIORESAL) 20 MG tablet Take 20 mg by mouth 3 (three) times daily.    . Blood Glucose Calibration (ACCU-CHEK AVIVA) SOLN     . cholecalciferol (VITAMIN D) 1000 UNITS tablet Take 1,000 Units by mouth daily.    . cloNIDine (CATAPRES) 0.1 MG tablet Take 1 tablet by mouth two  times daily 180 tablet 1  . Cyanocobalamin (VITAMIN B-12 PO) Take 1 tablet by mouth 2 (two) times daily. Patient is unsure of dose    . ferrous sulfate 325 (65 FE) MG tablet Take 325 mg by mouth daily with breakfast.    . finasteride (PROSCAR) 5 MG tablet Take 5 mg by mouth daily.    Marland Kitchen gabapentin (NEURONTIN) 800 MG tablet Take 1,600  mg by mouth 2 (two) times daily. Pt takes 2 tablets in the morning, 2 tablets at bedtime    . Interferon Beta-1a 30 MCG/0.5ML AJKT Inject 30 mcg into the muscle once a week. On Sunday 5 each 3  . Lancets (ACCU-CHEK MULTICLIX) lancets Use to check blood glucose  once daily 102 each 1  . meloxicam (MOBIC) 7.5 MG tablet Take 1 tablet (7.5 mg total) by mouth 2 (two) times daily. 60 tablet 6  . metFORMIN (GLUCOPHAGE) 1000 MG tablet Take 1 tablet by mouth two  times daily with meals 180 tablet 1  . Multiple Vitamin (MULTIVITAMIN) tablet Take 1 tablet by mouth daily.    . naproxen sodium (ALEVE) 220 MG tablet Take 440 mg by mouth daily as needed.     . Olmesartan-Amlodipine-HCTZ (TRIBENZOR) 40-10-12.5 MG TABS Take 1 tablet by mouth  daily 90 tablet 3  . omeprazole (PRILOSEC) 20 MG capsule Take 1 capsule (20 mg total) by mouth daily. 30 capsule 3  . pioglitazone (ACTOS) 30 MG tablet Take 1 tablet by mouth  every day 90 tablet 1  . tamsulosin (FLOMAX) 0.4 MG CAPS capsule Take  0.4 mg by mouth daily.    . traMADol (ULTRAM) 50 MG tablet Take 1 tablet (50 mg total) by mouth at bedtime as needed for severe pain. for pain 30 tablet 5  . traZODone (DESYREL) 50 MG tablet Take 1 tablet (50 mg total) by mouth at bedtime. 30 tablet 6  . traZODone (DESYREL) 50 MG tablet TAKE 1 TABLET BY MOUTH AT  BEDTIME 30 tablet 0   No current facility-administered medications for this visit.     PAST MEDICAL HISTORY: Past Medical History:  Diagnosis Date  . Abnormality of gait   . Acute delirium 12/11/2011  . CTS (carpal tunnel syndrome) 09/26/2015  . Diabetes mellitus   . Hypercholesterolemia   . Hypertension   . Kidney stones   . Leukocytopenia 05/22/2013  . MS (multiple sclerosis) [facility])    sees [facility]  . T10 spinal cord injury ([place]) 12/12/2011   hx surgery 1996 with bilat gait abnormality since    PAST SURGICAL HISTORY: Past Surgical History:  Procedure Laterality Date  . BACK SURGERY    .  COLONOSCOPY    . LUMBAR LAMINECTOMY      FAMILY HISTORY: Family History  Problem Relation Age of Onset  . Hypertension Mother   . Liver cancer Father   . Hypertension Sister     SOCIAL HISTORY:  Social History   Social History  . Marital status: Legally Separated    Spouse name: N/A  . Number of children: 1  . Years of education: 51   Occupational History  . Disability    Social History Main Topics  . Smoking status: Never Smoker  . Smokeless tobacco: Never Used  . Alcohol use No  . Drug use: No  . Sexual activity: Not Currently   Other Topics Concern  . Not on file   Social History Narrative   Work or [place] alone, feels safe      Spiritual Beliefs: Christian      Lifestyle: works out with friend 3 days per week at [employer]; diet is poor - likes to [employer] but like unhealthy options      Right-handed.    2 cups caffeine per day.        PHYSICAL EXAM   Vitals:   03/03/16 1523  BP: (!) 142/86  Pulse: 86  Weight: 166 lb (75.3 kg)  Height: 5\' 7"  (1.702 m)    Not recorded      Body mass index is 26 kg/m.  PHYSICAL EXAMNIATION:  Gen: NAD, conversant, well nourised, obese, well groomed                     Cardiovascular: Regular rate rhythm, no peripheral edema, warm, nontender. Eyes: Conjunctivae clear without exudates or hemorrhage Neck: Supple, no carotid bruise. Pulmonary: Clear to auscultation bilaterally   NEUROLOGICAL EXAM:  MENTAL STATUS: Speech:    Speech is normal; fluent and spontaneous with normal comprehension.  Cognition:     Orientation to time, place and person     Normal recent and remote memory     Normal Attention span and concentration     Normal Language, naming, repeating,spontaneous speech     Fund of knowledge   CRANIAL NERVES: CN II: Visual fields are full to confrontation. Fundoscopic exam is normal with sharp discs and no vascular changes. Pupils are round equal and briskly reactive to  light. CN III, IV, VI: extraocular movement are normal. No ptosis.  CN V: Facial sensation is intact to pinprick in all 3 divisions bilaterally. Corneal responses are intact.  CN VII: Face is symmetric with normal eye closure and smile. CN VIII: Hearing is normal to rubbing fingers CN IX, X: Palate elevates symmetrically. Phonation is normal. CN XI: Head turning and shoulder shrug are intact CN XII: Tongue is midline with normal movements and no atrophy.  MOTOR: He has moderate bilateral abductor pollicis abductor atrophy and weakness, mild weakness of bilateral grip, bilateral proximal upper extremity strength were normal, bilateral hip flexion 2/2, bilateral knee flexion 1/1, bilateral knee extension 4/4, bilateral ankle dorsiflexion 4/4, bilateral ankle plantarflexion 4/4,   REFLEXES: Reflexes are  3 and symmetric at the biceps, triceps, knees, and ankles. Plantar responses are flexor.  SENSORY:  length dependent decreased to light touch , pinprick and vibratory sensation at knee level   COORDINATION: Rapid alternating movements and fine finger movements are intact. There is no dysmetria on finger-to-nose and heel-knee-shin.    GAIT/STANCE: Rely on his crutches, dragging both legs   DIAGNOSTIC DATA (LABS, IMAGING, TESTING) - I reviewed patient records, labs, notes, testing and imaging myself where available.   ASSESSMENT AND PLAN  Larry [name] is a 65 y.o. male    Relapsing remitting multiple sclerosis  Continue Avonex, Refilled his prescription today  Thoracic myelopathy Evidence of cervical myelopathy   Progressive worsening bowel incontinence, we have written Loperamide 2mg  as needed.  Low back pain, radiating pain to right hip, right lower extremity, consistent with right lumbar radiculopathy  Lumbar L4-5 decompression surgery in July 2017 has helped his symptoms   Chronic insomnia  Stop amitriptyline, add on trazodone 50 mg daily  Marcial Pacas, M.D.  Ph.D.  [facility] [address] [place], [place] [address] Ph: [phone] Fax: [phone]  CC: Lucretia Kern, DO  Addendum: I reviewed neurosurgeon note dated Oct 02 2015 by Dr. Ellene Route, patient had severe stenosis at L5-S1, stenosis at T10-11, Dr. Ellene Route has suggested him to have lumbar decompression surgery, fusion of L5-S1, Mr. Castiglioni had like to hold off surgery until July,

## 2016-03-19 DIAGNOSIS — M4317 Spondylolisthesis, lumbosacral region: Secondary | ICD-10-CM | POA: Diagnosis not present

## 2016-03-23 ENCOUNTER — Other Ambulatory Visit: Payer: Self-pay | Admitting: Neurology

## 2016-03-27 ENCOUNTER — Ambulatory Visit: Payer: Medicare Other | Admitting: Family Medicine

## 2016-04-02 ENCOUNTER — Telehealth: Payer: Self-pay | Admitting: *Deleted

## 2016-04-02 NOTE — Telephone Encounter (Signed)
Dr Maudie Mercury received a fax from [facility] at [facility] for forms to be signed from a summary/discharge visit report for occupational therapy.  Dr Maudie Mercury stated she has not seen the pt, did not order this and would not be able to sign the forms.  I called [facility] at [phone] and informed [place] of this and she stated she will send this to the correct provider.

## 2016-05-04 ENCOUNTER — Other Ambulatory Visit: Payer: Self-pay | Admitting: Family Medicine

## 2016-05-07 ENCOUNTER — Other Ambulatory Visit: Payer: Self-pay | Admitting: *Deleted

## 2016-05-07 MED ORDER — GABAPENTIN 800 MG PO TABS: 1600.0000 mg | ORAL_TABLET | Freq: Two times a day (BID) | ORAL | 0 refills | Status: DC

## 2016-05-07 NOTE — Telephone Encounter (Signed)
See prior note as the pt was offered an appt with Dr Maudie Mercury or transferred to a scheduler for an appt with Dr Yong Channel and declined-stated he will call back.

## 2016-05-07 NOTE — Telephone Encounter (Signed)
I called the pt and offered to schedule an appt with Dr Maudie Mercury or have him talk to a scheduler to make an appt with Dr Yong Channel and he stated he will think about this and call back.  He is aware a 30 day supply of Neurontin was sent to his pharmacy.

## 2016-05-07 NOTE — Telephone Encounter (Signed)
Please call pt. We have not seen him in a long time and he has requested to transfer to Dr. Yong Channel. I can refill these medications for 30 days only. He needs a follow up appointment or his appointment with Dr. Yong Channel to transfer prior to any further refills.

## 2016-05-25 ENCOUNTER — Other Ambulatory Visit: Payer: Self-pay | Admitting: Family Medicine

## 2016-05-26 ENCOUNTER — Other Ambulatory Visit: Payer: Self-pay | Admitting: Emergency Medicine

## 2016-05-27 ENCOUNTER — Other Ambulatory Visit: Payer: Self-pay | Admitting: Emergency Medicine

## 2016-05-28 ENCOUNTER — Other Ambulatory Visit: Payer: Self-pay | Admitting: Emergency Medicine

## 2016-05-28 NOTE — Telephone Encounter (Signed)
Spoke with patient in regarding prescription request for refills. Told pt that he needed to make an appointment for further refills. Pt stated that he would call the office back to make an appointment.    Refill request for: Glucophage 1000mg , Actos 30mg , Catapres 0.1mg     Pharmacy [facility]

## 2016-05-28 NOTE — Telephone Encounter (Signed)
Spoke with pt regarding refill request for Crestor 20mg . Pt did not understand why his dosage increased from 5mg  to 20mg . Explained to pt that his cardiologist increased the doasage 09/16/2015 form 5mg  to the 20mg . Told pt that I can send in the medication if he would like or he could call his cardiologist and get further information on why the increase took place. Pt stated that he would like medication sent in and that he would follow up with his cardiologist about the increase.

## 2016-06-02 ENCOUNTER — Telehealth: Payer: Self-pay | Admitting: Family Medicine

## 2016-06-02 NOTE — Telephone Encounter (Signed)
Pt would like to transfer from [facility] to [facility] is this ok with both of you?  Due to location

## 2016-06-03 NOTE — Telephone Encounter (Signed)
Ok with me 

## 2016-06-04 ENCOUNTER — Other Ambulatory Visit: Payer: Self-pay | Admitting: *Deleted

## 2016-06-04 MED ORDER — PIOGLITAZONE HCL 30 MG PO TABS: 30.0000 mg | ORAL_TABLET | Freq: Every day | ORAL | 0 refills | Status: DC

## 2016-06-04 MED ORDER — METFORMIN HCL 1000 MG PO TABS: ORAL_TABLET | ORAL | 0 refills | Status: DC

## 2016-06-04 MED ORDER — CLONIDINE HCL 0.1 MG PO TABS: 0.1000 mg | ORAL_TABLET | Freq: Two times a day (BID) | ORAL | 0 refills | Status: DC

## 2016-06-04 NOTE — Telephone Encounter (Signed)
ok 

## 2016-06-04 NOTE — Telephone Encounter (Signed)
Rx done. 

## 2016-06-08 NOTE — Telephone Encounter (Signed)
Pt has been scheduled.  °

## 2016-06-24 ENCOUNTER — Ambulatory Visit: Payer: Medicare Other | Admitting: Nurse Practitioner

## 2016-07-13 ENCOUNTER — Other Ambulatory Visit: Payer: Self-pay | Admitting: Family Medicine

## 2016-07-13 ENCOUNTER — Other Ambulatory Visit: Payer: Self-pay | Admitting: Neurology

## 2016-07-14 ENCOUNTER — Other Ambulatory Visit: Payer: Self-pay | Admitting: *Deleted

## 2016-07-14 MED ORDER — BACLOFEN 20 MG PO TABS: 20.0000 mg | ORAL_TABLET | Freq: Three times a day (TID) | ORAL | 1 refills | Status: DC

## 2016-07-20 ENCOUNTER — Other Ambulatory Visit: Payer: Self-pay | Admitting: Neurology

## 2016-07-23 DIAGNOSIS — M4317 Spondylolisthesis, lumbosacral region: Secondary | ICD-10-CM | POA: Diagnosis not present

## 2016-07-27 ENCOUNTER — Ambulatory Visit (INDEPENDENT_AMBULATORY_CARE_PROVIDER_SITE_OTHER): Payer: Medicare Other | Admitting: Nurse Practitioner

## 2016-07-27 ENCOUNTER — Other Ambulatory Visit (INDEPENDENT_AMBULATORY_CARE_PROVIDER_SITE_OTHER): Payer: Medicare Other

## 2016-07-27 ENCOUNTER — Encounter: Payer: Self-pay | Admitting: Nurse Practitioner

## 2016-07-27 VITALS — BP 118/60 | HR 92 | Temp 97.7°F | Ht 67.0 in | Wt 169.0 lb

## 2016-07-27 DIAGNOSIS — E118 Type 2 diabetes mellitus with unspecified complications: Secondary | ICD-10-CM

## 2016-07-27 DIAGNOSIS — Z13220 Encounter for screening for lipoid disorders: Secondary | ICD-10-CM

## 2016-07-27 DIAGNOSIS — IMO0002 Reserved for concepts with insufficient information to code with codable children: Secondary | ICD-10-CM

## 2016-07-27 DIAGNOSIS — Z136 Encounter for screening for cardiovascular disorders: Secondary | ICD-10-CM

## 2016-07-27 DIAGNOSIS — K219 Gastro-esophageal reflux disease without esophagitis: Secondary | ICD-10-CM | POA: Diagnosis not present

## 2016-07-27 DIAGNOSIS — E1165 Type 2 diabetes mellitus with hyperglycemia: Secondary | ICD-10-CM | POA: Diagnosis not present

## 2016-07-27 DIAGNOSIS — I152 Hypertension secondary to endocrine disorders: Secondary | ICD-10-CM

## 2016-07-27 LAB — COMPREHENSIVE METABOLIC PANEL
ALBUMIN: 4.8 g/dL (ref 3.5–5.2)
ALT: 13 U/L (ref 0–53)
AST: 23 U/L (ref 0–37)
Alkaline Phosphatase: 70 U/L (ref 39–117)
BUN: 16 mg/dL (ref 6–23)
CHLORIDE: 103 meq/L (ref 96–112)
CO2: 30 mEq/L (ref 19–32)
Calcium: 9.8 mg/dL (ref 8.4–10.5)
Creatinine, Ser: 1.03 mg/dL (ref 0.40–1.50)
GFR: 76.34 mL/min (ref >60.00)
Glucose, Bld: 106 mg/dL — ABNORMAL HIGH (ref 70–99)
POTASSIUM: 4 meq/L (ref 3.5–5.1)
SODIUM: 140 meq/L (ref 135–145)
Total Bilirubin: 0.4 mg/dL (ref 0.2–1.2)
Total Protein: 7.3 g/dL (ref 6.0–8.3)

## 2016-07-27 LAB — LIPID PANEL
Cholesterol: 168 mg/dL (ref 0–200)
HDL: 46.9 mg/dL (ref >39.00)
LDL Cholesterol: 99 mg/dL (ref 0–99)
NONHDL: 121.47
TRIGLYCERIDES: 113 mg/dL (ref 0.0–149.0)
Total CHOL/HDL Ratio: 4
VLDL: 22.6 mg/dL (ref 0.0–40.0)

## 2016-07-27 LAB — HEMOGLOBIN A1C: HEMOGLOBIN A1C: 6.4 % (ref 4.6–6.5)

## 2016-07-27 LAB — MICROALBUMIN / CREATININE URINE RATIO
Creatinine,U: 123.1 mg/dL
MICROALB UR: 4.7 mg/dL — AB (ref 0.0–1.9)
MICROALB/CREAT RATIO: 3.8 mg/g (ref 0.0–30.0)

## 2016-07-27 NOTE — Progress Notes (Signed)
Subjective:  Patient ID: Larry [name], male    DOB: 1951/03/26  Age: 66 y.o. MRN: [id number]  CC: Establish Care (transfer from Larry [name] ) and Encopresis (Pt stated having incontinent bowel problem fro 8 months)   HPI  He denies any acute symptoms.  Diabetes: Denies any hypo or hyperglycemic episodes. Has chronic peripheral neuropathy: controlled with gabapentin. Last eye exam over 1year ago Last hgb A1c 6.0 Current use of of metformin and actos. Check glucose once a day (morning: 100-115)  HTN Stable with tribenzor and clonidine.  BPH: Managed by urologist: use of proscar and flomax.  MS: Managed by Larry [name] (neurologist).  Fecal incontinence: Intermittent, onset over 1year ago, gradually worsening, no ABD pain, no diarrhea, no N/V. No hematochezia or melena, no weight loss, no change in weight. Evaluated by GI last year, OV indicates it was related to MS. He has not discussed complaint with neurologist.  Outpatient Medications Prior to Visit  Medication Sig Dispense Refill  . ACCU-CHEK AVIVA PLUS test strip TEST ONCE DAILY 100 each 1  . amitriptyline (ELAVIL) 25 MG tablet Take 1 tablet by mouth at bedtime.    Marland Kitchen aspirin 81 MG tablet Take 81 mg by mouth daily.    . baclofen (LIORESAL) 20 MG tablet Take 1 tablet (20 mg total) by mouth 3 (three) times daily. 270 each 1  . Blood Glucose Calibration (ACCU-CHEK AVIVA) SOLN     . cholecalciferol (VITAMIN D) 1000 UNITS tablet Take 1,000 Units by mouth daily.    . cloNIDine (CATAPRES) 0.1 MG tablet Take 1 tablet (0.1 mg total) by mouth 2 (two) times daily. 180 tablet 0  . Cyanocobalamin (VITAMIN B-12 PO) Take 1 tablet by mouth 2 (two) times daily. Patient is unsure of dose    . finasteride (PROSCAR) 5 MG tablet Take 5 mg by mouth daily.    Marland Kitchen gabapentin (NEURONTIN) 800 MG tablet Take 2 tablets (1,600 mg total) by mouth 2 (two) times daily. Pt takes 2 tablets in the morning, 2 tablets at bedtime 120 tablet 0  . interferon beta-1a  (AVONEX) 30 MCG injection Inject 30 mcg into the muscle every 7 (seven) days. 12 each 4  . Interferon Beta-1a 30 MCG/0.5ML AJKT Inject 30 mcg into the muscle once a week. On Sunday 5 each 3  . Lancets (ACCU-CHEK MULTICLIX) lancets USE TO CHECK BLOOD GLUCOSE  ONCE DAILY 102 each 1  . loperamide (LOPERAMIDE A-D) 2 MG tablet Take 1 tablet (2 mg total) by mouth as needed for diarrhea or loose stools. 30 tablet 11  . meloxicam (MOBIC) 7.5 MG tablet Take 1 tablet (7.5 mg total) by mouth 2 (two) times daily. 60 tablet 6  . metFORMIN (GLUCOPHAGE) 1000 MG tablet TAKE 1 TABLET BY MOUTH TWO  TIMES DAILY WITH MEALS 180 tablet 0  . Multiple Vitamin (MULTIVITAMIN) tablet Take 1 tablet by mouth daily.    . naproxen sodium (ALEVE) 220 MG tablet Take 440 mg by mouth daily as needed.     . Olmesartan-Amlodipine-HCTZ 40-10-12.5 MG TABS TAKE 1 TABLET BY MOUTH  DAILY 90 tablet 0  . omeprazole (PRILOSEC) 20 MG capsule Take 1 capsule (20 mg total) by mouth daily. 30 capsule 3  . pioglitazone (ACTOS) 30 MG tablet Take 1 tablet (30 mg total) by mouth daily. 90 tablet 0  . tamsulosin (FLOMAX) 0.4 MG CAPS capsule Take 0.4 mg by mouth daily.    . traMADol (ULTRAM) 50 MG tablet Take 1 tablet (50 mg total) by mouth  at bedtime as needed for severe pain. for pain 30 tablet 5  . traZODone (DESYREL) 50 MG tablet Take 1 tablet (50 mg total) by mouth at bedtime. 30 tablet 6  . ferrous sulfate 325 (65 FE) MG tablet Take 325 mg by mouth daily with breakfast.    . traZODone (DESYREL) 50 MG tablet TAKE 1 TABLET BY MOUTH AT  BEDTIME 30 tablet 0  . traZODone (DESYREL) 50 MG tablet TAKE 1 TABLET BY MOUTH AT  BEDTIME 30 tablet 5   No facility-administered medications prior to visit.     ROS See HPI  Objective:  BP 118/60   Pulse 92   Temp 97.7 F (36.5 C)   Ht 5\' 7"  (1.702 m)   Wt 169 lb (76.7 kg)   SpO2 97%   BMI 26.47 kg/m   BP Readings from Last 3 Encounters:  07/27/16 118/60  03/03/16 (!) 142/86  01/20/16 124/79     Wt Readings from Last 3 Encounters:  07/27/16 169 lb (76.7 kg)  03/03/16 166 lb (75.3 kg)  01/06/16 168 lb 10.4 oz (76.5 kg)    Physical Exam  Constitutional: He is oriented to person, place, and time. No distress.  Cardiovascular: Normal rate, regular rhythm and normal heart sounds.   Pulmonary/Chest: Effort normal and breath sounds normal.  Abdominal: Soft. Bowel sounds are normal. He exhibits no distension. There is no tenderness.  Musculoskeletal: He exhibits no edema.  Paraplegic. Ambulates with crutches.  Neurological: He is alert and oriented to person, place, and time.  Vitals reviewed.   Lab Results  Component Value Date   WBC 5.0 01/05/2016   HGB 8.0 (L) 01/05/2016   HCT 25.3 (L) 01/05/2016   PLT 286 01/05/2016   GLUCOSE 106 (H) 07/27/2016   CHOL 168 07/27/2016   TRIG 113.0 07/27/2016   HDL 46.90 07/27/2016   LDLCALC 99 07/27/2016   ALT 13 07/27/2016   AST 23 07/27/2016   NA 140 07/27/2016   K 4.0 07/27/2016   CL 103 07/27/2016   CREATININE 1.03 07/27/2016   BUN 16 07/27/2016   CO2 30 07/27/2016   INR 1.01 02/16/2011   HGBA1C 6.4 07/27/2016   MICROALBUR 4.7 (H) 07/27/2016    No results found.  Assessment & Plan:   Larry [name] was seen today for establish care and encopresis.  Diagnoses and all orders for this visit:  Uncontrolled type 2 diabetes mellitus with complication, without long-term current use of insulin (HCC) -     Comprehensive metabolic panel; Future -     Hemoglobin A1c; Future -     Microalbumin / creatinine urine ratio; Future  Hypertension due to endocrine disorder -     Comprehensive metabolic panel; Future  Gastroesophageal reflux disease without esophagitis  Encounter for lipid screening for cardiovascular disease -     Lipid panel; Future   I have discontinued Larry [name] ferrous sulfate. I am also having him maintain his cholecalciferol, multivitamin, aspirin, ACCU-CHEK AVIVA, Cyanocobalamin (VITAMIN B-12 PO), traMADol,  traZODone, meloxicam, omeprazole, naproxen sodium, amitriptyline, Interferon Beta-1a, finasteride, tamsulosin, loperamide, interferon beta-1a, gabapentin, accu-chek multiclix, ACCU-CHEK AVIVA PLUS, metFORMIN, pioglitazone, cloNIDine, Olmesartan-Amlodipine-HCTZ, and baclofen.  Meds ordered this encounter  Medications  . DISCONTD: baclofen (LIORESAL) 10 MG tablet    Follow-up: Return in about 3 months (around 10/24/2016) for CPE (fasting).  Wilfred Lacy, NP

## 2016-07-30 DIAGNOSIS — R6889 Other general symptoms and signs: Secondary | ICD-10-CM | POA: Diagnosis not present

## 2016-07-31 DIAGNOSIS — R6889 Other general symptoms and signs: Secondary | ICD-10-CM | POA: Diagnosis not present

## 2016-08-01 DIAGNOSIS — R6889 Other general symptoms and signs: Secondary | ICD-10-CM | POA: Diagnosis not present

## 2016-08-02 DIAGNOSIS — R6889 Other general symptoms and signs: Secondary | ICD-10-CM | POA: Diagnosis not present

## 2016-08-03 DIAGNOSIS — R6889 Other general symptoms and signs: Secondary | ICD-10-CM | POA: Diagnosis not present

## 2016-08-04 DIAGNOSIS — R6889 Other general symptoms and signs: Secondary | ICD-10-CM | POA: Diagnosis not present

## 2016-08-05 DIAGNOSIS — R6889 Other general symptoms and signs: Secondary | ICD-10-CM | POA: Diagnosis not present

## 2016-08-06 DIAGNOSIS — R6889 Other general symptoms and signs: Secondary | ICD-10-CM | POA: Diagnosis not present

## 2016-08-07 DIAGNOSIS — R6889 Other general symptoms and signs: Secondary | ICD-10-CM | POA: Diagnosis not present

## 2016-08-08 DIAGNOSIS — R6889 Other general symptoms and signs: Secondary | ICD-10-CM | POA: Diagnosis not present

## 2016-08-09 DIAGNOSIS — R6889 Other general symptoms and signs: Secondary | ICD-10-CM | POA: Diagnosis not present

## 2016-08-10 DIAGNOSIS — R6889 Other general symptoms and signs: Secondary | ICD-10-CM | POA: Diagnosis not present

## 2016-08-11 DIAGNOSIS — R6889 Other general symptoms and signs: Secondary | ICD-10-CM | POA: Diagnosis not present

## 2016-08-12 DIAGNOSIS — R6889 Other general symptoms and signs: Secondary | ICD-10-CM | POA: Diagnosis not present

## 2016-08-13 DIAGNOSIS — R6889 Other general symptoms and signs: Secondary | ICD-10-CM | POA: Diagnosis not present

## 2016-08-14 DIAGNOSIS — R6889 Other general symptoms and signs: Secondary | ICD-10-CM | POA: Diagnosis not present

## 2016-08-15 DIAGNOSIS — R6889 Other general symptoms and signs: Secondary | ICD-10-CM | POA: Diagnosis not present

## 2016-08-16 DIAGNOSIS — R6889 Other general symptoms and signs: Secondary | ICD-10-CM | POA: Diagnosis not present

## 2016-08-17 DIAGNOSIS — R6889 Other general symptoms and signs: Secondary | ICD-10-CM | POA: Diagnosis not present

## 2016-08-18 ENCOUNTER — Other Ambulatory Visit: Payer: Self-pay | Admitting: *Deleted

## 2016-08-18 DIAGNOSIS — R6889 Other general symptoms and signs: Secondary | ICD-10-CM | POA: Diagnosis not present

## 2016-08-18 MED ORDER — OLMESARTAN-AMLODIPINE-HCTZ 40-10-12.5 MG PO TABS: 1.0000 | ORAL_TABLET | Freq: Every day | ORAL | 0 refills | Status: DC

## 2016-08-18 MED ORDER — GABAPENTIN 800 MG PO TABS: 1600.0000 mg | ORAL_TABLET | Freq: Two times a day (BID) | ORAL | 0 refills | Status: DC

## 2016-08-18 NOTE — Telephone Encounter (Signed)
Rx denied as Dr Maudie Mercury is no longer listed as current PCP.

## 2016-08-19 ENCOUNTER — Telehealth: Payer: Self-pay | Admitting: *Deleted

## 2016-08-19 DIAGNOSIS — R6889 Other general symptoms and signs: Secondary | ICD-10-CM | POA: Diagnosis not present

## 2016-08-19 NOTE — Telephone Encounter (Signed)
A user error has taken place: open by mistake.../lmb  

## 2016-08-20 DIAGNOSIS — R6889 Other general symptoms and signs: Secondary | ICD-10-CM | POA: Diagnosis not present

## 2016-08-21 DIAGNOSIS — R6889 Other general symptoms and signs: Secondary | ICD-10-CM | POA: Diagnosis not present

## 2016-08-22 DIAGNOSIS — R6889 Other general symptoms and signs: Secondary | ICD-10-CM | POA: Diagnosis not present

## 2016-08-23 DIAGNOSIS — R6889 Other general symptoms and signs: Secondary | ICD-10-CM | POA: Diagnosis not present

## 2016-08-24 DIAGNOSIS — R6889 Other general symptoms and signs: Secondary | ICD-10-CM | POA: Diagnosis not present

## 2016-08-25 DIAGNOSIS — R6889 Other general symptoms and signs: Secondary | ICD-10-CM | POA: Diagnosis not present

## 2016-08-26 DIAGNOSIS — R6889 Other general symptoms and signs: Secondary | ICD-10-CM | POA: Diagnosis not present

## 2016-08-27 DIAGNOSIS — R6889 Other general symptoms and signs: Secondary | ICD-10-CM | POA: Diagnosis not present

## 2016-08-28 DIAGNOSIS — R6889 Other general symptoms and signs: Secondary | ICD-10-CM | POA: Diagnosis not present

## 2016-08-29 DIAGNOSIS — R6889 Other general symptoms and signs: Secondary | ICD-10-CM | POA: Diagnosis not present

## 2016-08-30 DIAGNOSIS — R6889 Other general symptoms and signs: Secondary | ICD-10-CM | POA: Diagnosis not present

## 2016-08-31 DIAGNOSIS — R6889 Other general symptoms and signs: Secondary | ICD-10-CM | POA: Diagnosis not present

## 2016-09-01 DIAGNOSIS — R6889 Other general symptoms and signs: Secondary | ICD-10-CM | POA: Diagnosis not present

## 2016-09-02 DIAGNOSIS — R6889 Other general symptoms and signs: Secondary | ICD-10-CM | POA: Diagnosis not present

## 2016-09-03 DIAGNOSIS — R6889 Other general symptoms and signs: Secondary | ICD-10-CM | POA: Diagnosis not present

## 2016-09-04 DIAGNOSIS — R6889 Other general symptoms and signs: Secondary | ICD-10-CM | POA: Diagnosis not present

## 2016-09-05 DIAGNOSIS — R6889 Other general symptoms and signs: Secondary | ICD-10-CM | POA: Diagnosis not present

## 2016-09-06 DIAGNOSIS — R6889 Other general symptoms and signs: Secondary | ICD-10-CM | POA: Diagnosis not present

## 2016-09-07 ENCOUNTER — Encounter: Payer: Self-pay | Admitting: Adult Health

## 2016-09-07 ENCOUNTER — Ambulatory Visit (INDEPENDENT_AMBULATORY_CARE_PROVIDER_SITE_OTHER): Payer: Medicare Other | Admitting: Adult Health

## 2016-09-07 VITALS — BP 122/65 | HR 98 | Wt 166.4 lb

## 2016-09-07 DIAGNOSIS — G35 Multiple sclerosis: Secondary | ICD-10-CM

## 2016-09-07 DIAGNOSIS — R159 Full incontinence of feces: Secondary | ICD-10-CM

## 2016-09-07 DIAGNOSIS — Z5181 Encounter for therapeutic drug level monitoring: Secondary | ICD-10-CM | POA: Diagnosis not present

## 2016-09-07 DIAGNOSIS — R6889 Other general symptoms and signs: Secondary | ICD-10-CM | POA: Diagnosis not present

## 2016-09-07 NOTE — Patient Instructions (Signed)
Nerve conduction studies Continue Avonex

## 2016-09-07 NOTE — Progress Notes (Signed)
PATIENT: Larry [name] DOB: [birth year]  REASON FOR VISIT: follow up- multiple sclerosis HISTORY FROM: patient  HISTORY OF PRESENT ILLNESS: HISTORY 03/03/16: Larry [name] is a 66 year old male, accompanied by her daughter [name], seen in refer by his primary care physician Dr. Rodena Goldmann for evaluation of worsening gait difficulty, multiple sclerosis  He was a patient of our office in the past, last clinical visit was July 2013, he had a history of diabetes since 2002, also with  diagnosis of relapsing remitting multiple sclerosis, initial presentation was gait difficulty in 1996, he fell few times, he was initially diagnosed with lumbar spinal stenosis, had lumbar spine disc decompression surgery, which fail to improve his symptoms, he continued to have worsening gait difficulty, this eventually led to hospital admission at [facility], the diagnosis of MS was based on abnormal MRI findings, spinal fluid testing, after rehabilitation, he was able to ambulate again with a cane, there was no visual loss, he was started treatment with Avonex since 1996, he moved to [place] in 1999, was under the care of by Dr. Delphia Grates at [facility], was treated with Betaseron for a short period of time, but he could not tolerate it due to injection side effect, went back on Avelox shortly afterwards  over the years, he denies significant relapsing episode, he had slow decline in his walking, has been ambulate with crutches, exercise regularly  He had a history of lumbar decompression surgery  in 1996 at [place] with very difficult recovery, he was in wheelchair for 6 months because of gait difficulty.  He presented to the hospital in July 2013 for worsening gait difficulty, had extensive imaging study, I have personally reviewed MRI of the brain in 2013 showed chronic changes, cervical spine with significant cord compression at C4-5 C5-6, but no abnormal  cord signal to suggest cervical MS lesions, MRI of thoracic spine showed chronic cord compression and myelomalacia at T10-11, he was evaluated by neurosurgeon, Dr. Ellene Route, the plan was to treat him with short course of steroid, followed by thoracic elective decompression, he has made significant improvement not steroid treatment, has lost follow-up,  Over the past 3 years, he had worsening of his gait difficulty, he is able to walk with crutches, but with worsening bending forward posture and difficulty, recent few months, he also noticed bilateral hands paresthesia. he lives in his own apartment, has 12 steps he denies significant low back pain, no bowel and bladder incontinence, he exercise 3 times each week,  UPDATE April 6th 2017: He was evaluated by Dr. Ellene Route in early March 2017, per patient, he was offered decompression surgery for cervical and thoracic stenosis, he also had MRI of lumbar He is now complaining of worsening right low back pain, radiating pain to right hip and right thigh,   He also began to notice bilateral hands numbness weakness since 2003, gradually getting worse, is very difficult for him to button his shirt  I have personally reviewed MRI of lumbar in March 2017: Multilevel degenerative changes, most severe at L4-5 multifactorial severe spinal stenosis lateral recess stenosis. Marked foraminal narrowing greater on the right  UPDATE Mar 03 2016:  He had L4-5 decompressive laminectomy, decompression of  L4-5 nerve roots, posterior lumbar interbody arthrodeses with autograft and allograft by Dr. Ellene Route on December 30 2015, he responded very well, he no longer has significant right low back pain radiating to his right hip, his posture has improved, he can move better,  However since surgery, he noticed increasing incident of bowel incontinence, couple times each week, he denies significant problem with his bladder  We again reviewed presurgical MRI in March 2017,  multilevel lumbar degenerative disc disease, L4-5 severe spinal stenosis, lateral recess stenosis, marked foraminal narrowing greater on the right, MRI of thoracic spine moderate to severe spinal stenosis at T10-11, severe bilateral foraminal stenosis, T2 hyper intensity cord signal, facet hypertrophy at T9-10, with severe bilateral foraminal stenosis, MRI of cervical spine, Mild spinal canal stenosis severe bilateral foraminal stenosis at C4-5, C5-6 and C6-7   Today 09/17/2016:  Larry [name] is a 66 year old male with a history of multiple sclerosis and paraplegia. He returns today for follow-up. He is currently taking Avonex and tolerating it well. Reports that he has been on this medication for over 20 years. He denies any new numbness or weakness. Denies any changes with his vision. He states with his gait he has to use 2 canes when ambulating. He states that he is a little slower when ambulating and it takes him longer to do things. He states that this is been a progressive change nothing sudden. He does note continued bowel incontinence. He states that it did seem to be slightly worse after surgery. He states that the incontinence is variable. He states that he has regular bowel movements every day but there are occasions that he will have an episode of incontinence mornings. He states that sometimes the incontinence is formed stools and other times it is just "a smear." He does not have any incontinence of the bladder. He denies any pain in the lower back or down the legs. Denies any numbness down the legs or arms. He returns today for an evaluation.   REVIEW OF SYSTEMS: Out of a complete 14 system review of symptoms, the patient complains only of the following symptoms, and all other reviewed systems are negative.  Incontinence of bowels  ALLERGIES: Allergies  Allergen Reactions  . No Known Allergies     HOME MEDICATIONS: Outpatient Medications Prior to Visit  Medication Sig Dispense  Refill  . ACCU-CHEK AVIVA PLUS test strip TEST ONCE DAILY 100 each 1  . aspirin 81 MG tablet Take 81 mg by mouth daily.    . baclofen (LIORESAL) 20 MG tablet Take 1 tablet (20 mg total) by mouth 3 (three) times daily. 270 each 1  . Blood Glucose Calibration (ACCU-CHEK AVIVA) SOLN     . cholecalciferol (VITAMIN D) 1000 UNITS tablet Take 1,000 Units by mouth daily.    . cloNIDine (CATAPRES) 0.1 MG tablet Take 1 tablet (0.1 mg total) by mouth 2 (two) times daily. 180 tablet 0  . Cyanocobalamin (VITAMIN B-12 PO) Take 1 tablet by mouth 2 (two) times daily. Patient is unsure of dose    . finasteride (PROSCAR) 5 MG tablet Take 5 mg by mouth daily.    Marland Kitchen gabapentin (NEURONTIN) 800 MG tablet Take 2 tablets (1,600 mg total) by mouth 2 (two) times daily. Pt takes 2 tablets in the morning, 2 tablets at bedtime 360 tablet 0  . interferon beta-1a (AVONEX) 30 MCG injection Inject 30 mcg into the muscle every 7 (seven) days. 12 each 4  . Interferon Beta-1a 30 MCG/0.5ML AJKT Inject 30 mcg into the muscle once a week. On Sunday 5 each 3  . Lancets (ACCU-CHEK MULTICLIX) lancets USE TO CHECK BLOOD GLUCOSE  ONCE DAILY 102 each 1  . loperamide (LOPERAMIDE A-D) 2 MG tablet Take 1 tablet (2 mg total) by mouth  as needed for diarrhea or loose stools. 30 tablet 11  . meloxicam (MOBIC) 7.5 MG tablet Take 1 tablet (7.5 mg total) by mouth 2 (two) times daily. 60 tablet 6  . metFORMIN (GLUCOPHAGE) 1000 MG tablet TAKE 1 TABLET BY MOUTH TWO  TIMES DAILY WITH MEALS 180 tablet 0  . Multiple Vitamin (MULTIVITAMIN) tablet Take 1 tablet by mouth daily.    . naproxen sodium (ALEVE) 220 MG tablet Take 440 mg by mouth daily as needed.     . Olmesartan-Amlodipine-HCTZ 40-10-12.5 MG TABS Take 1 tablet by mouth daily. 90 tablet 0  . omeprazole (PRILOSEC) 20 MG capsule Take 1 capsule (20 mg total) by mouth daily. 30 capsule 3  . pioglitazone (ACTOS) 30 MG tablet Take 1 tablet (30 mg total) by mouth daily. 90 tablet 0  . tamsulosin (FLOMAX)  0.4 MG CAPS capsule Take 0.4 mg by mouth daily.    . traMADol (ULTRAM) 50 MG tablet Take 1 tablet (50 mg total) by mouth at bedtime as needed for severe pain. for pain 30 tablet 5  . traZODone (DESYREL) 50 MG tablet Take 1 tablet (50 mg total) by mouth at bedtime. 30 tablet 6  . amitriptyline (ELAVIL) 25 MG tablet Take 1 tablet by mouth at bedtime.     No facility-administered medications prior to visit.     PAST MEDICAL HISTORY: Past Medical History:  Diagnosis Date  . Abnormality of gait   . Acute delirium 12/11/2011  . CTS (carpal tunnel syndrome) 09/26/2015  . Diabetes mellitus   . Hypercholesterolemia   . Hypertension   . Kidney stones   . Leukocytopenia 05/22/2013  . MS (multiple sclerosis) [facility])    sees [facility]  . 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 BACK SURGERY    . COLONOSCOPY    . LUMBAR LAMINECTOMY      FAMILY HISTORY: Family History  Problem Relation Age of Onset  . Hypertension Mother   . Liver cancer Father   . Hypertension Sister     SOCIAL HISTORY: Social History   Social History  . Marital status: Legally Separated    Spouse name: N/A  . Number of children: 1  . Years of education: 54   Occupational History  . Disability    Social History Main Topics  . Smoking status: Never Smoker  . Smokeless tobacco: Never Used  . Alcohol use No  . Drug use: No  . Sexual activity: Not Currently   Other Topics Concern  . Not on file   Social History Narrative   Work or [place] alone, feels safe      Spiritual Beliefs: Christian      Lifestyle: works out with friend 3 days per week at [employer]; diet is poor - likes to [employer] but like unhealthy options      Right-handed.    2 cups caffeine per day.         PHYSICAL EXAM  Vitals:   09/07/16 0949  BP: 122/65  Pulse: 98    Weight: 166 lb 6.4 oz (75.5 kg)   Body mass index is 26.06 kg/m.  Generalized: Well developed, in no acute distress   Neurological examination  Mentation: Alert oriented to time, place, history taking. Follows all commands speech and language fluent Cranial nerve II-XII: Pupils were equal  round reactive to light. Extraocular movements were full, visual field were full on confrontational test. Facial sensation and strength were normal. Uvula tongue midline. Head turning and shoulder shrug  were normal and symmetric. Motor: Good strength throughout. Some weakness noted in hip flexion. Sensory: Sensory testing is intact to soft touch on all 4 extremities. No evidence of extinction is noted.  Coordination: Cerebellar testing reveals good finger-nose-finger Difficulty with heel-to-shin bilaterally.  Gait and station: Patient has crutches/cane that he uses when immediately. Gait is unsteady. Tandem gait not attempted. Reflexes: Deep tendon reflexes are symmetric and normal in the upper extremity. Hyperreflexic in the lower extremities.  DIAGNOSTIC DATA (LABS, IMAGING, TESTING) - I reviewed patient records, labs, notes, testing and imaging myself where available.  Lab Results  Component Value Date   WBC 5.0 01/05/2016   HGB 8.0 (L) 01/05/2016   HCT 25.3 (L) 01/05/2016   MCV 93.0 01/05/2016   PLT 286 01/05/2016      Component Value Date/Time   NA 140 07/27/2016 1257   NA 144 01/04/2014 0943   K 4.0 07/27/2016 1257   K 3.8 01/04/2014 0943   CL 103 07/27/2016 1257   CO2 30 07/27/2016 1257   CO2 28 01/04/2014 0943   GLUCOSE 106 (H) 07/27/2016 1257   GLUCOSE 113 01/04/2014 0943   BUN 16 07/27/2016 1257   BUN 18.1 01/04/2014 0943   CREATININE 1.03 07/27/2016 1257   CREATININE 1.0 01/04/2014 0943   CALCIUM 9.8 07/27/2016 1257   CALCIUM 10.2 01/04/2014 0943   PROT 7.3 07/27/2016 1257   PROT 7.4 01/04/2014 0943   ALBUMIN 4.8 07/27/2016 1257   ALBUMIN 4.6 01/04/2014 0943   AST 23  07/27/2016 1257   AST 30 01/04/2014 0943   ALT 13 07/27/2016 1257   ALT 17 01/04/2014 0943   ALKPHOS 70 07/27/2016 1257   ALKPHOS 64 01/04/2014 0943   BILITOT 0.4 07/27/2016 1257   BILITOT 0.41 01/04/2014 0943   GFRNONAA >60 01/03/2016 0517   GFRNONAA >89 12/25/2011 1132   GFRAA >60 01/03/2016 0517   GFRAA >89 12/25/2011 1132   Lab Results  Component Value Date   CHOL 168 07/27/2016   HDL 46.90 07/27/2016   LDLCALC 99 07/27/2016   TRIG 113.0 07/27/2016   CHOLHDL 4 07/27/2016   Lab Results  Component Value Date   HGBA1C 6.4 07/27/2016   Lab Results  Component Value Date   VITAMINB12 748 01/01/2016   No results found for: TSH    ASSESSMENT AND PLAN 65 y.o. year old male  has a past medical history of Abnormality of gait; Acute delirium (12/11/2011); CTS (carpal tunnel syndrome) (09/26/2015); Diabetes mellitus; Hypercholesterolemia; Hypertension; Kidney stones; Leukocytopenia (05/22/2013); MS (multiple sclerosis) ([place]); and T10 spinal cord injury ([place]) (12/12/2011). here with:  1. Multiple sclerosis 2. Bowel incontinence  I discussed with Dr. Krista Blue the patient symptom of bowel incontinence. The patient does have changes in the lower lumbar spine according to the MRI. The patient was given a choice to do nerve conduction studies to evaluate for nerve damage or a follow-up with his neurosurgeon. At this time the patient would like to proceed with nerve conduction studies with EMG. The patient will continue on Avonex. I will check blood work today. Patient is advised that if his symptoms worsen or he develops new symptoms he should let us know. He will follow-up with Dr. Krista Blue for nerve conduction studies and  in 6 months or sooner if needed.    Ward Givens, MSN, NP-C  09/07/2016, 9:51 AM [facility] [address] [place], [place] [address] [phone]

## 2016-09-08 ENCOUNTER — Telehealth: Payer: Self-pay | Admitting: *Deleted

## 2016-09-08 DIAGNOSIS — R6889 Other general symptoms and signs: Secondary | ICD-10-CM | POA: Diagnosis not present

## 2016-09-08 LAB — CBC WITH DIFFERENTIAL/PLATELET
BASOS: 0 %
Basophils Absolute: 0 10*3/uL (ref 0.0–0.2)
EOS (ABSOLUTE): 0 10*3/uL (ref 0.0–0.4)
EOS: 1 %
HEMATOCRIT: 32.8 % — AB (ref 37.5–51.0)
HEMOGLOBIN: 10.9 g/dL — AB (ref 13.0–17.7)
IMMATURE GRANS (ABS): 0 10*3/uL (ref 0.0–0.1)
IMMATURE GRANULOCYTES: 1 %
LYMPHS: 9 %
Lymphocytes Absolute: 0.4 10*3/uL — ABNORMAL LOW (ref 0.7–3.1)
MCH: 30.4 pg (ref 26.6–33.0)
MCHC: 33.2 g/dL (ref 31.5–35.7)
MCV: 91 fL (ref 79–97)
MONOCYTES: 8 %
Monocytes Absolute: 0.4 10*3/uL (ref 0.1–0.9)
Neutrophils Absolute: 3.9 10*3/uL (ref 1.4–7.0)
Neutrophils: 81 %
Platelets: 302 10*3/uL (ref 150–379)
RBC: 3.59 x10E6/uL — AB (ref 4.14–5.80)
RDW: 14.8 % (ref 12.3–15.4)
WBC: 4.8 10*3/uL (ref 3.4–10.8)

## 2016-09-08 NOTE — Telephone Encounter (Signed)
Per Edman Circle, NP, LVM informing patient that his labs results show that his RBC, hemoglobin and hematocrit has improved. Advised his lymphocyte count is decreased, and Megan will need to monitor this. Left number for any questions.

## 2016-09-09 DIAGNOSIS — R6889 Other general symptoms and signs: Secondary | ICD-10-CM | POA: Diagnosis not present

## 2016-09-09 NOTE — Progress Notes (Signed)
I have reviewed and agreed above plan. 

## 2016-09-10 DIAGNOSIS — R6889 Other general symptoms and signs: Secondary | ICD-10-CM | POA: Diagnosis not present

## 2016-09-11 DIAGNOSIS — R6889 Other general symptoms and signs: Secondary | ICD-10-CM | POA: Diagnosis not present

## 2016-09-12 DIAGNOSIS — R6889 Other general symptoms and signs: Secondary | ICD-10-CM | POA: Diagnosis not present

## 2016-09-13 DIAGNOSIS — R6889 Other general symptoms and signs: Secondary | ICD-10-CM | POA: Diagnosis not present

## 2016-09-14 DIAGNOSIS — R6889 Other general symptoms and signs: Secondary | ICD-10-CM | POA: Diagnosis not present

## 2016-09-15 DIAGNOSIS — R6889 Other general symptoms and signs: Secondary | ICD-10-CM | POA: Diagnosis not present

## 2016-09-16 DIAGNOSIS — R6889 Other general symptoms and signs: Secondary | ICD-10-CM | POA: Diagnosis not present

## 2016-09-17 DIAGNOSIS — R6889 Other general symptoms and signs: Secondary | ICD-10-CM | POA: Diagnosis not present

## 2016-09-18 DIAGNOSIS — R6889 Other general symptoms and signs: Secondary | ICD-10-CM | POA: Diagnosis not present

## 2016-09-19 DIAGNOSIS — R6889 Other general symptoms and signs: Secondary | ICD-10-CM | POA: Diagnosis not present

## 2016-09-20 DIAGNOSIS — R6889 Other general symptoms and signs: Secondary | ICD-10-CM | POA: Diagnosis not present

## 2016-09-21 DIAGNOSIS — R6889 Other general symptoms and signs: Secondary | ICD-10-CM | POA: Diagnosis not present

## 2016-09-22 DIAGNOSIS — R6889 Other general symptoms and signs: Secondary | ICD-10-CM | POA: Diagnosis not present

## 2016-09-23 DIAGNOSIS — R6889 Other general symptoms and signs: Secondary | ICD-10-CM | POA: Diagnosis not present

## 2016-09-24 DIAGNOSIS — R6889 Other general symptoms and signs: Secondary | ICD-10-CM | POA: Diagnosis not present

## 2016-09-25 DIAGNOSIS — R6889 Other general symptoms and signs: Secondary | ICD-10-CM | POA: Diagnosis not present

## 2016-09-26 DIAGNOSIS — R6889 Other general symptoms and signs: Secondary | ICD-10-CM | POA: Diagnosis not present

## 2016-09-27 DIAGNOSIS — R6889 Other general symptoms and signs: Secondary | ICD-10-CM | POA: Diagnosis not present

## 2016-09-28 DIAGNOSIS — R6889 Other general symptoms and signs: Secondary | ICD-10-CM | POA: Diagnosis not present

## 2016-09-29 DIAGNOSIS — R6889 Other general symptoms and signs: Secondary | ICD-10-CM | POA: Diagnosis not present

## 2016-09-30 DIAGNOSIS — R6889 Other general symptoms and signs: Secondary | ICD-10-CM | POA: Diagnosis not present

## 2016-10-01 DIAGNOSIS — R6889 Other general symptoms and signs: Secondary | ICD-10-CM | POA: Diagnosis not present

## 2016-10-02 DIAGNOSIS — R6889 Other general symptoms and signs: Secondary | ICD-10-CM | POA: Diagnosis not present

## 2016-10-03 DIAGNOSIS — R6889 Other general symptoms and signs: Secondary | ICD-10-CM | POA: Diagnosis not present

## 2016-10-04 DIAGNOSIS — R6889 Other general symptoms and signs: Secondary | ICD-10-CM | POA: Diagnosis not present

## 2016-10-05 DIAGNOSIS — R6889 Other general symptoms and signs: Secondary | ICD-10-CM | POA: Diagnosis not present

## 2016-10-06 ENCOUNTER — Other Ambulatory Visit: Payer: Self-pay | Admitting: Nurse Practitioner

## 2016-10-06 ENCOUNTER — Other Ambulatory Visit: Payer: Self-pay | Admitting: Family Medicine

## 2016-10-06 DIAGNOSIS — R6889 Other general symptoms and signs: Secondary | ICD-10-CM | POA: Diagnosis not present

## 2016-10-06 NOTE — Telephone Encounter (Signed)
Routing to [facility], please advise, thanks 

## 2016-10-07 DIAGNOSIS — R6889 Other general symptoms and signs: Secondary | ICD-10-CM | POA: Diagnosis not present

## 2016-10-09 ENCOUNTER — Encounter: Payer: Medicare Other | Admitting: Neurology

## 2016-10-09 DIAGNOSIS — R6889 Other general symptoms and signs: Secondary | ICD-10-CM | POA: Diagnosis not present

## 2016-10-10 DIAGNOSIS — R6889 Other general symptoms and signs: Secondary | ICD-10-CM | POA: Diagnosis not present

## 2016-10-11 DIAGNOSIS — R6889 Other general symptoms and signs: Secondary | ICD-10-CM | POA: Diagnosis not present

## 2016-10-12 DIAGNOSIS — R6889 Other general symptoms and signs: Secondary | ICD-10-CM | POA: Diagnosis not present

## 2016-10-13 DIAGNOSIS — R6889 Other general symptoms and signs: Secondary | ICD-10-CM | POA: Diagnosis not present

## 2016-10-14 DIAGNOSIS — R6889 Other general symptoms and signs: Secondary | ICD-10-CM | POA: Diagnosis not present

## 2016-10-15 DIAGNOSIS — R6889 Other general symptoms and signs: Secondary | ICD-10-CM | POA: Diagnosis not present

## 2016-10-16 DIAGNOSIS — R6889 Other general symptoms and signs: Secondary | ICD-10-CM | POA: Diagnosis not present

## 2016-10-17 DIAGNOSIS — R6889 Other general symptoms and signs: Secondary | ICD-10-CM | POA: Diagnosis not present

## 2016-10-18 DIAGNOSIS — R6889 Other general symptoms and signs: Secondary | ICD-10-CM | POA: Diagnosis not present

## 2016-10-19 ENCOUNTER — Other Ambulatory Visit: Payer: Self-pay | Admitting: *Deleted

## 2016-10-19 MED ORDER — TRAZODONE HCL 50 MG PO TABS: 50.0000 mg | ORAL_TABLET | Freq: Every day | ORAL | 1 refills | Status: DC

## 2016-10-20 DIAGNOSIS — R6889 Other general symptoms and signs: Secondary | ICD-10-CM | POA: Diagnosis not present

## 2016-10-21 DIAGNOSIS — R6889 Other general symptoms and signs: Secondary | ICD-10-CM | POA: Diagnosis not present

## 2016-10-22 DIAGNOSIS — R6889 Other general symptoms and signs: Secondary | ICD-10-CM | POA: Diagnosis not present

## 2016-10-23 DIAGNOSIS — R6889 Other general symptoms and signs: Secondary | ICD-10-CM | POA: Diagnosis not present

## 2016-10-24 DIAGNOSIS — R6889 Other general symptoms and signs: Secondary | ICD-10-CM | POA: Diagnosis not present

## 2016-10-25 DIAGNOSIS — R6889 Other general symptoms and signs: Secondary | ICD-10-CM | POA: Diagnosis not present

## 2016-10-26 DIAGNOSIS — R6889 Other general symptoms and signs: Secondary | ICD-10-CM | POA: Diagnosis not present

## 2016-10-27 DIAGNOSIS — R6889 Other general symptoms and signs: Secondary | ICD-10-CM | POA: Diagnosis not present

## 2016-10-28 DIAGNOSIS — R6889 Other general symptoms and signs: Secondary | ICD-10-CM | POA: Diagnosis not present

## 2016-10-29 DIAGNOSIS — R6889 Other general symptoms and signs: Secondary | ICD-10-CM | POA: Diagnosis not present

## 2016-10-30 DIAGNOSIS — R6889 Other general symptoms and signs: Secondary | ICD-10-CM | POA: Diagnosis not present

## 2016-10-31 DIAGNOSIS — R6889 Other general symptoms and signs: Secondary | ICD-10-CM | POA: Diagnosis not present

## 2016-11-01 DIAGNOSIS — R6889 Other general symptoms and signs: Secondary | ICD-10-CM | POA: Diagnosis not present

## 2016-11-02 ENCOUNTER — Other Ambulatory Visit: Payer: Self-pay | Admitting: Family Medicine

## 2016-11-03 DIAGNOSIS — R6889 Other general symptoms and signs: Secondary | ICD-10-CM | POA: Diagnosis not present

## 2016-11-04 DIAGNOSIS — R6889 Other general symptoms and signs: Secondary | ICD-10-CM | POA: Diagnosis not present

## 2016-11-05 ENCOUNTER — Telehealth: Payer: Self-pay | Admitting: *Deleted

## 2016-11-05 DIAGNOSIS — R6889 Other general symptoms and signs: Secondary | ICD-10-CM | POA: Diagnosis not present

## 2016-11-05 NOTE — Telephone Encounter (Signed)
Pt left msg on triage stating he is new patient w/[facility] needing new rx for his Gabapentin

## 2016-11-05 NOTE — Telephone Encounter (Signed)
Needs to request refills from neurology.

## 2016-11-06 ENCOUNTER — Other Ambulatory Visit: Payer: Self-pay | Admitting: Neurology

## 2016-11-06 ENCOUNTER — Other Ambulatory Visit: Payer: Self-pay

## 2016-11-06 DIAGNOSIS — R6889 Other general symptoms and signs: Secondary | ICD-10-CM | POA: Diagnosis not present

## 2016-11-06 MED ORDER — GABAPENTIN 800 MG PO TABS: 1600.0000 mg | ORAL_TABLET | Freq: Two times a day (BID) | ORAL | 4 refills | Status: DC

## 2016-11-06 MED ORDER — GABAPENTIN 800 MG PO TABS: 1600.0000 mg | ORAL_TABLET | Freq: Two times a day (BID) | ORAL | 2 refills | Status: DC

## 2016-11-06 NOTE — Telephone Encounter (Signed)
Refilled gabapentin 800mg  ii bid [phone] with 4 refills

## 2016-11-06 NOTE — Telephone Encounter (Signed)
Called pt no answer LMOM w/[facility] response...Larry [name]

## 2016-11-06 NOTE — Telephone Encounter (Signed)
GABAPENTIN sent to [facility] per patients request.

## 2016-11-06 NOTE — Telephone Encounter (Signed)
Pt request refill for gabapentin (NEURONTIN) 800 MG tablet 90 day supply sent to [facility].

## 2016-11-13 ENCOUNTER — Encounter: Payer: Medicare Other | Admitting: Neurology

## 2016-12-08 ENCOUNTER — Other Ambulatory Visit: Payer: Self-pay | Admitting: Nurse Practitioner

## 2016-12-21 ENCOUNTER — Other Ambulatory Visit: Payer: Self-pay | Admitting: Family Medicine

## 2016-12-21 ENCOUNTER — Other Ambulatory Visit: Payer: Self-pay | Admitting: Neurology

## 2017-01-01 ENCOUNTER — Ambulatory Visit (INDEPENDENT_AMBULATORY_CARE_PROVIDER_SITE_OTHER): Payer: Medicare Other | Admitting: Neurology

## 2017-01-01 DIAGNOSIS — R159 Full incontinence of feces: Secondary | ICD-10-CM | POA: Diagnosis not present

## 2017-01-01 DIAGNOSIS — G8220 Paraplegia, unspecified: Secondary | ICD-10-CM | POA: Diagnosis not present

## 2017-01-01 DIAGNOSIS — G35 Multiple sclerosis: Secondary | ICD-10-CM | POA: Diagnosis not present

## 2017-01-01 DIAGNOSIS — M48061 Spinal stenosis, lumbar region without neurogenic claudication: Secondary | ICD-10-CM | POA: Diagnosis not present

## 2017-01-01 DIAGNOSIS — M5416 Radiculopathy, lumbar region: Secondary | ICD-10-CM | POA: Diagnosis not present

## 2017-01-01 MED ORDER — DEPEND ADJUSTABLE UNDERWEAR LG MISC: 12 refills | Status: AC

## 2017-01-01 NOTE — Procedures (Signed)
Full Name: Larry [name] Gender: Male MRN #: [id number] Date of Birth: March 18, 1951    Visit Date: 01/01/2017 09:52 Age: 66 Years 57 Months Old Examining Physician: Marcial Pacas, MD  Referring Physician: Krista Blue, MD History: 66 year old male, with history of significant degenerative disc disease along his spine, including mild cervical spinal stenosis mild to level severe cervical foraminal narrowing, thoracic spinal stenosis at T10-11, lumbar stenosis, status post lumbar decompression surgery which has helped his low back pain, but he continue have gradual worsening lower extremity weakness, gait abnormality, and worsening bowel incontinence  Summary of the test:  Nerve conduction study:  Bilateral sural sensory responses were normal, bilateral superficial peroneal sensory responses showed mildly decreased snap amplitude. Bilateral peroneal motor responses were within normal limits. Bilateral tibial motor responses were absent.  Electromyography: Selective needle examinations were performed at right upper extremity, right cervical paraspinal muscles, bilateral lower extremity muscles, and bilateral lumbar sacral paraspinal muscles.  There is evidence of mild chronic neuropathic changes involving bilateral lumbar sacral myotomes, bilateral L4-5 S1. There is also evidence of decreased activation at left lower extremity muscles such as left medial gastrocnemius, which is suggestive of upper motor neuron etiology. There is no evidence of spontaneous activity at bilateral lumbar sacral paraspinal muscles.  There is evidence of chronic neuropathic changes after right C5, C7, T1 myotomes.   There is no evidence of spontaneous activity at cervical paraspinal muscles.   Conclusion: This is an abnormal study. There is electrodiagnostic evidence of chronic mild bilateral lumbar sacral radiculopathy, there is no evidence of active process. In addition, there is also evidence of chronic right cervical  radiculopathy, mainly involving right C5, C7-T1 myotomes.    ------------------------------- Marcial Pacas, M.D.  [facility] [place], [place] [address] Tel: [phone] Fax: [phone]        [facility]    Nerve / Sites Muscle Latency Ref. Amplitude Ref. Rel Amp Segments Distance Velocity Ref. Area    ms ms mV mV %  cm m/s m/s mVms  R Peroneal - EDB     Ankle EDB 4.7 ?6.5 0.6 ?2.0 100 Ankle - EDB 9   1.4     Fib head EDB 11.6  0.4  62.9 Fib head - Ankle 30 43 ?44 1.9     Pop fossa EDB 14.5  0.2  55.3 Pop fossa - Fib head 12 42 ?44 1.4         Pop fossa - Ankle      L Peroneal - EDB     Ankle EDB 4.6 ?6.5 1.2 ?2.0 100 Ankle - EDB 9   2.9     Fib head EDB 11.1  1.0  87.1 Fib head - Ankle 29 45 ?44 2.7     Pop fossa EDB 13.9  1.1  111 Pop fossa - Fib head 12 43 ?44 3.0         Pop fossa - Ankle      R Tibial - AH     Ankle AH NR ?5.8 NR ?4.0 NR Ankle - AH 9   NR  L Tibial - AH     Ankle AH NR ?5.8 NR ?4.0 NR Ankle - AH 9   NR             SNC    Nerve / Sites Rec. Site Peak Lat Ref.  Amp Ref. Segments Distance    ms ms V V  cm  R Sural -  Ankle (Calf)     Calf Ankle 3.2 ?4.4 6 ?6 Calf - Ankle 14  L Sural - Ankle (Calf)     Calf Ankle 3.3 ?4.4 6 ?6 Calf - Ankle 14  R Superficial peroneal - Ankle     Lat leg Ankle 4.0 ?4.4 4 ?6 Lat leg - Ankle 14  L Superficial peroneal - Ankle     Lat leg Ankle 4.0 ?4.4 2 ?6 Lat leg - Ankle 14             EMG full       EMG       EMG Summary Table    Spontaneous MUAP Recruitment  Muscle IA Fib PSW Fasc Other Amp Dur. Poly Pattern  R. Tibialis anterior Increased None None None _______   Normal Reduced  R. Tibialis posterior Increased None None None _______ Normal Normal Normal Reduced  L. Tibialis anterior Increased 1+ None None _______ Normal Normal Normal Reduced  L. Tibialis posterior Increased 1+ None None _______ Normal Normal Normal Reduced  L. Vastus lateralis Normal None None None _______ Normal Normal  Normal Reduced  L. Lumbar paraspinals (mid) Normal None None None _______ Normal Normal Normal Normal  L. Lumbar paraspinals (low) Normal None None None _______ Normal Normal Normal Normal  R. Lumbar paraspinals (mid) Normal None None None _______ Normal Normal Normal Normal  R. Lumbar paraspinals (low) Normal None None None _______ Normal Normal Normal Normal  R. First dorsal interosseous Increased None None None _______ Normal Normal Normal Reduced  R. Abductor pollicis brevis Normal None None None _______ Normal Normal Normal Reduced  R. Pronator teres Normal None None None _______ Normal Normal Normal Normal  R. Deltoid Normal None None None _______ Normal Normal Normal Normal  R. Biceps brachii Normal None None None _______ Normal Normal Normal Reduced  R. Cervical paraspinals Normal None None None _______ Normal Normal Normal Normal  R. Vastus lateralis Increased None None None _______ Increased Increased Normal Reduced

## 2017-01-01 NOTE — Progress Notes (Signed)
PATIENT: Larry [name] DOB: 1950/06/06  HISTORY OF PRESENT ILLNESS: HISTORY 03/03/16: Larry [name] is a 66 year old male, accompanied by her daughter [name], seen in refer by his primary care physician Dr. Rodena Goldmann for evaluation of worsening gait difficulty, multiple sclerosis, initial evaluation was on June 12 2015.  He was a patient of our office in the past, last clinical visit was July 2013, he had a history of diabetes since 2002, also with  diagnosis of relapsing remitting multiple sclerosis, initial presentation was gait difficulty in 1996, he fell few times, he was initially diagnosed with lumbar spinal stenosis, had lumbar spine disc decompression surgery, which fail to improve his symptoms, he continued to have worsening gait difficulty, this eventually led to hospital admission at [facility], the diagnosis of MS was based on abnormal MRI findings, spinal fluid testing, after rehabilitation, he was able to ambulate again with a cane, there was no visual loss, he was started treatment with Avonex since 1996, he moved to [place] in 1999, was under the care of Dr. Delphia Grates at [facility], was treated with Betaseron for a short period of time, but he could not tolerate it due to injection side effect, went back on Avelox shortly afterwards  over the years, he denies significant relapsing episode, he had slow decline in his walking, has been ambulate with crutches, exercise regularly  He had a history of lumbar decompression surgery  in 1996 at [place] with very difficult recovery, he was in wheelchair for 6 months because of gait difficulty.  He presented to the hospital in July 2013 for worsening gait difficulty, had extensive imaging study,  I have personally reviewed MRI of the brain in 2013 showed chronic changes, cervical spine with significant cord compression at C4-5 C5-6, but no abnormal cord signal to suggest cervical  MS lesions, MRI of thoracic spine showed chronic cord compression and myelomalacia at T10-11, he was evaluated by neurosurgeon, Dr. Ellene Route, the plan was to treat him with short course of steroid, followed by thoracic elective decompression, he has made significant improvement not steroid treatment, has lost follow-up,  Over the past 3 years, he had worsening of his gait difficulty, he is able to walk with crutches, but with worsening bending forward posture and difficulty, recent few months, he also noticed bilateral hands paresthesia. he lives in his own apartment, has 12 steps,  he denies significant low back pain, no bowel and bladder incontinence, he exercise 3 times each week,  UPDATE April 6th 2017: He was evaluated by Dr. Ellene Route in early March 2017, per patient, he was offered decompression surgery for cervical and thoracic stenosis, he also had MRI of lumbar He is now complaining of worsening right low back pain, radiating pain to right hip and right thigh,   He also began to notice bilateral hands numbness weakness since 2003, gradually getting worse, is very difficult for him to button his shirt  I have personally reviewed MRI of lumbar in March 2017: Multilevel degenerative changes, most severe at L4-5 multifactorial severe spinal stenosis lateral recess stenosis. Marked foraminal narrowing greater on the right  UPDATE Mar 03 2016:  He had L4-5 decompressive laminectomy, decompression of  L4-5 nerve roots, posterior lumbar interbody arthrodeses with autograft and allograft by Dr. Ellene Route on December 30 2015, he responded very well, he no longer has significant right low back pain radiating to his right hip, his posture has improved, he can move better,  However since surgery,  he noticed increasing incident of bowel incontinence, couple times each week, he denies significant problem with his bladder  We again reviewed presurgical MRI in March 2017, multilevel lumbar degenerative disc  disease, L4-5 severe spinal stenosis, lateral recess stenosis, marked foraminal narrowing greater on the right, MRI of thoracic spine moderate to severe spinal stenosis at T10-11, severe bilateral foraminal stenosis, T2 hyper intensity cord signal, facet hypertrophy at T9-10, with severe bilateral foraminal stenosis, MRI of cervical spine, Mild spinal canal stenosis severe bilateral foraminal stenosis at C4-5, C5-6 and C6-7   UPDATE January 01 2017: He was evaluated by Jinny Blossom in April 2018, continue complains of slow worsening bowel incontinence, at least 1-2 times each week, 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 T8-9 mild spinal stenosis, mild right severe left foraminal stenosis, T11-12, disc bulging, facet hypertrophy with severe bilateral foraminal narrowing, T9-10, facet hypertrophy with severe by foraminal stenosis  REVIEW OF SYSTEMS: Out of a complete 14 system review of symptoms, the patient complains only of the following symptoms, and all other reviewed systems are negative.  Incontinence of bowels  ALLERGIES: Allergies  Allergen Reactions  . No Known Allergies     HOME MEDICATIONS: Outpatient Medications Prior to Visit  Medication Sig Dispense Refill  . ACCU-CHEK AVIVA PLUS test strip TEST ONCE DAILY 100 each 1  . aspirin 81 MG tablet Take 81 mg by mouth daily.    . baclofen (LIORESAL) 20 MG tablet TAKE 1 TABLET BY MOUTH 3  TIMES DAILY 270 tablet 3  . Blood Glucose Calibration (ACCU-CHEK AVIVA) SOLN     . cholecalciferol (VITAMIN D) 1000 UNITS tablet Take 1,000 Units by mouth daily.    . cloNIDine (CATAPRES) 0.1 MG tablet Take 1 tablet (0.1  mg total) by mouth 2 (two) times daily. 180 tablet 0  . Cyanocobalamin (VITAMIN B-12 PO) Take 1 tablet by mouth 2 (two) times daily. Patient is unsure of dose    . finasteride (PROSCAR) 5 MG tablet Take 5 mg by mouth daily.    Marland Kitchen gabapentin (NEURONTIN) 800 MG tablet Take 2 tablets (1,600 mg total) by mouth 2 (two) times daily. 360 tablet 4  . interferon beta-1a (AVONEX) 30 MCG injection Inject 30 mcg into the muscle every 7 (seven) days. 12 each 4  . Interferon Beta-1a 30 MCG/0.5ML AJKT Inject 30 mcg into the muscle once a week. On Sunday 5 each 3  . Lancets (ACCU-CHEK MULTICLIX) lancets USE TO CHECK BLOOD GLUCOSE  ONCE DAILY 102 each 1  . meloxicam (MOBIC) 7.5 MG tablet Take 1 tablet (7.5 mg total) by mouth 2 (two) times daily. 60 tablet 6  . metFORMIN (GLUCOPHAGE) 1000 MG tablet TAKE 1 TABLET BY MOUTH TWO  TIMES DAILY WITH MEALS 180 tablet 0  . Multiple Vitamin (MULTIVITAMIN) tablet Take 1 tablet by mouth daily.    . naproxen sodium (ALEVE) 220 MG tablet Take 440 mg by mouth daily as needed.     . Olmesartan-Amlodipine-HCTZ 40-10-12.5 MG TABS Take 1 tablet by mouth daily. Need to see PCP for future refills. 90 tablet 0  . omeprazole (PRILOSEC) 20 MG capsule Take 1 capsule (20 mg total) by mouth daily. 30 capsule 3  . pioglitazone (ACTOS) 30 MG tablet  Take 1 tablet (30 mg total) by mouth daily. 90 tablet 0  . tamsulosin (FLOMAX) 0.4 MG CAPS capsule Take 0.4 mg by mouth daily.    . traMADol (ULTRAM) 50 MG tablet Take 1 tablet (50 mg total) by mouth at bedtime as needed for severe pain. for pain 30 tablet 5  . traZODone (DESYREL) 50 MG tablet Take 1 tablet (50 mg total) by mouth at bedtime. 90 tablet 1   No facility-administered medications prior to visit.     PAST MEDICAL HISTORY: Past Medical History:  Diagnosis Date  . Abnormality of gait   . Acute delirium 12/11/2011  . CTS (carpal tunnel syndrome) 09/26/2015  . Diabetes mellitus   . Hypercholesterolemia   . Hypertension   . Kidney  stones   . Leukocytopenia 05/22/2013  . MS (multiple sclerosis) [facility])    sees [facility]  . T10 spinal cord injury ([place]) 12/12/2011   hx surgery 1996 with bilat gait abnormality since    PAST SURGICAL HISTORY: Past Surgical History:  Procedure Laterality Date  . BACK SURGERY    . COLONOSCOPY    . LUMBAR LAMINECTOMY      FAMILY HISTORY: Family History  Problem Relation Age of Onset  . Hypertension Mother   . Liver cancer Father   . Hypertension Sister     SOCIAL HISTORY: Social History   Social History  . Marital status: Legally Separated    Spouse name: N/A  . Number of children: 1  . Years of education: 13   Occupational History  . Disability    Social History Main Topics  . Smoking status: Never Smoker  . Smokeless tobacco: Never Used  . Alcohol use No  . Drug use: No  . Sexual activity: Not Currently   Other Topics Concern  . Not on file   Social History Narrative   Work or [place] alone, feels safe      Spiritual Beliefs: Christian      Lifestyle: works out with friend 3 days per week at [employer]; diet is poor - likes to [employer] but like unhealthy options      Right-handed.    2 cups caffeine per day.      PHYSICAL EXAM  There were no vitals filed for this visit. There is no height or weight on file to calculate BMI.   PHYSICAL EXAMNIATION:  Gen: NAD, conversant, well nourised, obese, well groomed                     Cardiovascular: Regular rate rhythm, no peripheral edema, warm, nontender. Eyes: Conjunctivae clear without exudates or hemorrhage Neck: Supple, no carotid bruits. Pulmonary: Clear to auscultation bilaterally   NEUROLOGICAL EXAM:  MENTAL STATUS: Speech:    Speech is normal; fluent and spontaneous with normal comprehension.  Cognition:     Orientation to time, place and person     Normal recent and remote memory     Normal Attention span and concentration     Normal Language, naming,  repeating,spontaneous speech     Fund of knowledge   CRANIAL NERVES: CN II: Visual fields are full to confrontation. Fundoscopic exam is normal with sharp discs and no vascular changes. Pupils are round equal and briskly reactive to light. CN III, IV, VI: extraocular movement are normal. No ptosis. CN V: Facial sensation is intact to pinprick in all 3 divisions bilaterally. Corneal responses are intact.  CN VII: Face is  symmetric with normal eye closure and smile. CN VIII: Hearing is normal to rubbing fingers CN IX, X: Palate elevates symmetrically. Phonation is normal. CN XI: Head turning and shoulder shrug are intact CN XII: Tongue is midline with normal movements and no atrophy.  MOTOR: Bilateral hand intrinsic muscle atrophy, mild bilateral grip, finger abduction weakness, no significant weakness at bilateral upper extremity proximal muscles. He has prominent bilateral lower extremity muscle weakness, no significant spasticity, hip flexion (R/L) 2/2, knee extension 4/4, knee flexion 4/4, ankle dorsiflexion 4//4, ankle plantar flexion 3/1.  REFLEXES: Reflexes are 3 and symmetric at the biceps, triceps, knees, and absent at ankles.    SENSORY: Intact to light touch, pinprick, positional and vibratory sensation are intact in fingers and toes.  COORDINATION: Rapid alternating movements and fine finger movements are intact. There is no dysmetria on finger-to-nose and heel-knee-shin.    GAIT/STANCE: He needs 2 crutches to ambulate.     DIAGNOSTIC DATA (LABS, IMAGING, TESTING) - I reviewed patient records, labs, notes, testing and imaging myself where available.  Lab Results  Component Value Date   WBC 4.8 09/07/2016   HGB 10.9 (L) 09/07/2016   HCT 32.8 (L) 09/07/2016   MCV 91 09/07/2016   PLT 302 09/07/2016      Component Value Date/Time   NA 140 07/27/2016 1257   NA 144 01/04/2014 0943   K 4.0 07/27/2016 1257   K 3.8 01/04/2014 0943   CL 103 07/27/2016 1257   CO2 30  07/27/2016 1257   CO2 28 01/04/2014 0943   GLUCOSE 106 (H) 07/27/2016 1257   GLUCOSE 113 01/04/2014 0943   BUN 16 07/27/2016 1257   BUN 18.1 01/04/2014 0943   CREATININE 1.03 07/27/2016 1257   CREATININE 1.0 01/04/2014 0943   CALCIUM 9.8 07/27/2016 1257   CALCIUM 10.2 01/04/2014 0943   PROT 7.3 07/27/2016 1257   PROT 7.4 01/04/2014 0943   ALBUMIN 4.8 07/27/2016 1257   ALBUMIN 4.6 01/04/2014 0943   AST 23 07/27/2016 1257   AST 30 01/04/2014 0943   ALT 13 07/27/2016 1257   ALT 17 01/04/2014 0943   ALKPHOS 70 07/27/2016 1257   ALKPHOS 64 01/04/2014 0943   BILITOT 0.4 07/27/2016 1257   BILITOT 0.41 01/04/2014 0943   GFRNONAA >60 01/03/2016 0517   GFRNONAA >89 12/25/2011 1132   GFRAA >60 01/03/2016 0517   GFRAA >89 12/25/2011 1132   Lab Results  Component Value Date   CHOL 168 07/27/2016   HDL 46.90 07/27/2016   LDLCALC 99 07/27/2016   TRIG 113.0 07/27/2016   CHOLHDL 4 07/27/2016   Lab Results  Component Value Date   HGBA1C 6.4 07/27/2016   Lab Results  Component Value Date   VITAMINB12 748 01/01/2016   No results found for: TSH    ASSESSMENT AND PLAN 66 y.o. 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 Ph.D.  [facility] [place], [place] [address] Phone: [phone] Fax:      [phone]

## 2017-01-11 ENCOUNTER — Telehealth: Payer: Self-pay | Admitting: Adult Health

## 2017-01-11 DIAGNOSIS — Z5181 Encounter for therapeutic drug level monitoring: Secondary | ICD-10-CM

## 2017-01-11 NOTE — Telephone Encounter (Signed)
-----   Message from Ward Givens, NP sent at 09/08/2016 11:18 AM EDT ----- Lymphocyte

## 2017-01-11 NOTE — Telephone Encounter (Signed)
Please call the patient. He needs to return for a repeat CBC. In April his lymphocyte count was low. We will recheck this.

## 2017-01-11 NOTE — Telephone Encounter (Signed)
Spoke to pt and relayed that this was a friendly reminder about getting repeat CBC to check WBC count. Come in not appt needed.   Gave hours of clinic.  He verbalized understanding.

## 2017-01-25 ENCOUNTER — Telehealth: Payer: Self-pay | Admitting: Neurology

## 2017-01-25 NOTE — Telephone Encounter (Signed)
Returned call to patient - he is going to contact his PCP for his Metformin refills.

## 2017-01-25 NOTE — Telephone Encounter (Signed)
Patient requesting new Rx for metFORMIN (GLUCOPHAGE) 1000 MG tablet called to [facility].

## 2017-01-26 ENCOUNTER — Telehealth: Payer: Self-pay | Admitting: Nurse Practitioner

## 2017-01-26 MED ORDER — METFORMIN HCL 1000 MG PO TABS: ORAL_TABLET | ORAL | 0 refills | Status: DC

## 2017-01-26 NOTE — Telephone Encounter (Signed)
[place], [place] [phone] (Phone) [phone] (Fax)   metFORMIN (GLUCOPHAGE) 1000 MG tablet  Patient is requesting a refill on this medication.

## 2017-01-26 NOTE — Telephone Encounter (Signed)
Called pt inform him he is overdue for appt and I'm not able to send 90 day, but will send 30 day to local pharmacy. Made appt for 02/11/17 and sent 30 day to [place]...Larry [name]

## 2017-02-08 ENCOUNTER — Other Ambulatory Visit: Payer: Self-pay | Admitting: Neurology

## 2017-02-08 ENCOUNTER — Telehealth: Payer: Self-pay | Admitting: Neurology

## 2017-02-08 NOTE — Telephone Encounter (Signed)
Dr. Krista Blue does not manage these medications for the patient.  He will call his PCP for refills.

## 2017-02-08 NOTE — Telephone Encounter (Signed)
Pt calling for refills of  tamsulosin (FLOMAX) 0.4 MG CAPS capsule  finasteride (PROSCAR) 5 MG tablet  Please call into  [facility], [place]

## 2017-02-11 ENCOUNTER — Ambulatory Visit: Payer: Medicare Other | Admitting: Nurse Practitioner

## 2017-02-15 ENCOUNTER — Encounter: Payer: Self-pay | Admitting: Nurse Practitioner

## 2017-02-15 ENCOUNTER — Other Ambulatory Visit (INDEPENDENT_AMBULATORY_CARE_PROVIDER_SITE_OTHER): Payer: Medicare Other

## 2017-02-15 ENCOUNTER — Ambulatory Visit (INDEPENDENT_AMBULATORY_CARE_PROVIDER_SITE_OTHER): Payer: Medicare Other | Admitting: Nurse Practitioner

## 2017-02-15 VITALS — BP 120/60 | HR 79 | Temp 97.7°F | Ht 67.0 in | Wt 161.0 lb

## 2017-02-15 DIAGNOSIS — Z23 Encounter for immunization: Secondary | ICD-10-CM | POA: Diagnosis not present

## 2017-02-15 DIAGNOSIS — E1142 Type 2 diabetes mellitus with diabetic polyneuropathy: Secondary | ICD-10-CM | POA: Diagnosis not present

## 2017-02-15 DIAGNOSIS — E1165 Type 2 diabetes mellitus with hyperglycemia: Secondary | ICD-10-CM

## 2017-02-15 DIAGNOSIS — N401 Enlarged prostate with lower urinary tract symptoms: Secondary | ICD-10-CM | POA: Diagnosis not present

## 2017-02-15 DIAGNOSIS — Z13220 Encounter for screening for lipoid disorders: Secondary | ICD-10-CM | POA: Diagnosis not present

## 2017-02-15 DIAGNOSIS — K219 Gastro-esophageal reflux disease without esophagitis: Secondary | ICD-10-CM

## 2017-02-15 DIAGNOSIS — I152 Hypertension secondary to endocrine disorders: Secondary | ICD-10-CM

## 2017-02-15 DIAGNOSIS — E118 Type 2 diabetes mellitus with unspecified complications: Secondary | ICD-10-CM

## 2017-02-15 DIAGNOSIS — E1151 Type 2 diabetes mellitus with diabetic peripheral angiopathy without gangrene: Secondary | ICD-10-CM

## 2017-02-15 DIAGNOSIS — Z136 Encounter for screening for cardiovascular disorders: Secondary | ICD-10-CM

## 2017-02-15 DIAGNOSIS — IMO0002 Reserved for concepts with insufficient information to code with codable children: Secondary | ICD-10-CM

## 2017-02-15 LAB — BASIC METABOLIC PANEL
BUN: 21 mg/dL (ref 6–23)
CALCIUM: 10.4 mg/dL (ref 8.4–10.5)
CHLORIDE: 104 meq/L (ref 96–112)
CO2: 31 meq/L (ref 19–32)
CREATININE: 0.98 mg/dL (ref 0.40–1.50)
GFR: 80.72 mL/min (ref >60.00)
GLUCOSE: 128 mg/dL — AB (ref 70–99)
Potassium: 3.9 mEq/L (ref 3.5–5.1)
Sodium: 142 mEq/L (ref 135–145)

## 2017-02-15 LAB — LIPID PANEL
Cholesterol: 176 mg/dL (ref 0–200)
HDL: 80.5 mg/dL (ref >39.00)
LDL Cholesterol: 76 mg/dL (ref 0–99)
NonHDL: 95.5
TRIGLYCERIDES: 100 mg/dL (ref 0.0–149.0)
Total CHOL/HDL Ratio: 2
VLDL: 20 mg/dL (ref 0.0–40.0)

## 2017-02-15 LAB — HEPATIC FUNCTION PANEL
ALBUMIN: 5 g/dL (ref 3.5–5.2)
ALT: 21 U/L (ref 0–53)
AST: 29 U/L (ref 0–37)
Alkaline Phosphatase: 68 U/L (ref 39–117)
Bilirubin, Direct: 0.1 mg/dL (ref 0.0–0.3)
TOTAL PROTEIN: 7.3 g/dL (ref 6.0–8.3)
Total Bilirubin: 0.4 mg/dL (ref 0.2–1.2)

## 2017-02-15 LAB — PSA: PSA: 0.53 ng/mL (ref 0.10–4.00)

## 2017-02-15 LAB — HEMOGLOBIN A1C: Hgb A1c MFr Bld: 6.4 % (ref 4.6–6.5)

## 2017-02-15 MED ORDER — OMEPRAZOLE 20 MG PO CPDR: 20.0000 mg | DELAYED_RELEASE_CAPSULE | Freq: Every day | ORAL | 1 refills | Status: DC

## 2017-02-15 MED ORDER — TAMSULOSIN HCL 0.4 MG PO CAPS: 0.4000 mg | ORAL_CAPSULE | Freq: Every day | ORAL | 1 refills | Status: DC

## 2017-02-15 MED ORDER — OLMESARTAN-AMLODIPINE-HCTZ 40-10-12.5 MG PO TABS: 1.0000 | ORAL_TABLET | Freq: Every day | ORAL | 0 refills | Status: DC

## 2017-02-15 MED ORDER — FINASTERIDE 5 MG PO TABS: 5.0000 mg | ORAL_TABLET | Freq: Every day | ORAL | 1 refills | Status: DC

## 2017-02-15 MED ORDER — METFORMIN HCL 1000 MG PO TABS: ORAL_TABLET | ORAL | 0 refills | Status: DC

## 2017-02-15 MED ORDER — PIOGLITAZONE HCL 30 MG PO TABS: 30.0000 mg | ORAL_TABLET | Freq: Every day | ORAL | 1 refills | Status: DC

## 2017-02-15 NOTE — Progress Notes (Signed)
Subjective:  Patient ID: Larry [name], male    DOB: 1950-11-02  Age: 66 y.o. MRN: [id number]  CC: Follow-up (6 mo fu med refills: metformin & pioglitazone-) and Tinnitus (ringing in right ear--2 wk ago?)   HPI BPH: Stable with use of proscar and flomax. Need refill.  HTN: stable with olmesartan, amlodipine and HCTZ. BP Readings from Last 3 Encounters:  02/15/17 120/60  09/07/16 122/65  07/27/16 118/60   DM: Controlled with metformin and actos. Home glucose: 100-120.  Had ringing in right ear on Saturday. Lasted for about 3hrs. Now resolved completed. Denies any dizziness or ear pain or ear drainage, change in hearing or head injury.  Outpatient Medications Prior to Visit  Medication Sig Dispense Refill  . ACCU-CHEK AVIVA PLUS test strip TEST ONCE DAILY 100 each 1  . aspirin 81 MG tablet Take 81 mg by mouth daily.    . baclofen (LIORESAL) 20 MG tablet TAKE 1 TABLET BY MOUTH 3  TIMES DAILY 270 tablet 3  . Blood Glucose Calibration (ACCU-CHEK AVIVA) SOLN     . cloNIDine (CATAPRES) 0.1 MG tablet Take 1 tablet (0.1 mg total) by mouth 2 (two) times daily. 180 tablet 0  . Cyanocobalamin (VITAMIN B-12 PO) Take 1 tablet by mouth 2 (two) times daily. Patient is unsure of dose    . gabapentin (NEURONTIN) 800 MG tablet Take 2 tablets (1,600 mg total) by mouth 2 (two) times daily. 360 tablet 4  . Incontinence Supply Disposable (DEPEND ADJUSTABLE UNDERWEAR LG) MISC Use as needed. 30 each 12  . interferon beta-1a (AVONEX) 30 MCG injection Inject 30 mcg into the muscle every 7 (seven) days. 12 each 4  . Interferon Beta-1a 30 MCG/0.5ML AJKT Inject 30 mcg into the muscle once a week. On Sunday 5 each 3  . Lancets (ACCU-CHEK MULTICLIX) lancets USE TO CHECK BLOOD GLUCOSE  ONCE DAILY 102 each 1  . Multiple Vitamin (MULTIVITAMIN) tablet Take 1 tablet by mouth daily.    . naproxen sodium (ALEVE) 220 MG tablet Take 440 mg by mouth daily as needed.     . traZODone (DESYREL) 50 MG tablet Take 1  tablet (50 mg total) by mouth at bedtime. 90 tablet 1  . finasteride (PROSCAR) 5 MG tablet Take 5 mg by mouth daily.    . metFORMIN (GLUCOPHAGE) 1000 MG tablet TAKE 1 TABLET BY MOUTH TWO  TIMES DAILY WITH MEALS 60 tablet 0  . Olmesartan-Amlodipine-HCTZ 40-10-12.5 MG TABS Take 1 tablet by mouth daily. Need to see PCP for future refills. 90 tablet 0  . omeprazole (PRILOSEC) 20 MG capsule Take 1 capsule (20 mg total) by mouth daily. 30 capsule 3  . pioglitazone (ACTOS) 30 MG tablet Take 1 tablet (30 mg total) by mouth daily. 90 tablet 0  . tamsulosin (FLOMAX) 0.4 MG CAPS capsule Take 0.4 mg by mouth daily.    . cholecalciferol (VITAMIN D) 1000 UNITS tablet Take 1,000 Units by mouth daily.    . meloxicam (MOBIC) 7.5 MG tablet Take 1 tablet (7.5 mg total) by mouth 2 (two) times daily. (Patient not taking: Reported on 02/15/2017) 60 tablet 6  . traMADol (ULTRAM) 50 MG tablet Take 1 tablet (50 mg total) by mouth at bedtime as needed for severe pain. for pain (Patient not taking: Reported on 02/15/2017) 30 tablet 5   No facility-administered medications prior to visit.     ROS See HPI  Objective:  BP 120/60   Pulse 79   Temp 97.7 F (36.5 C)  Ht 5\' 7"  (1.702 m)   Wt 161 lb (73 kg)   SpO2 99%   BMI 25.22 kg/m   BP Readings from Last 3 Encounters:  02/15/17 120/60  09/07/16 122/65  07/27/16 118/60    Wt Readings from Last 3 Encounters:  02/15/17 161 lb (73 kg)  09/07/16 166 lb 6.4 oz (75.5 kg)  07/27/16 169 lb (76.7 kg)    Physical Exam  Constitutional: He is oriented to person, place, and time. No distress.  HENT:  Right Ear: Hearing, tympanic membrane and ear canal normal. No mastoid tenderness.  Left Ear: Tympanic membrane, external ear and ear canal normal. No mastoid tenderness.  Neck: Normal range of motion. Neck supple. No thyromegaly present.  Cardiovascular: Normal rate.   Pulmonary/Chest: Effort normal.  Abdominal: He exhibits no distension.  Musculoskeletal: He  exhibits no edema or tenderness.  Lymphadenopathy:    He has no cervical adenopathy.  Neurological: He is alert and oriented to person, place, and time.  Abnormal diabetic foot exam  Skin: Skin is warm and dry. No rash noted. No erythema.  Psychiatric: He has a normal mood and affect. His behavior is normal.  Vitals reviewed.   Lab Results  Component Value Date   WBC 4.8 09/07/2016   HGB 10.9 (L) 09/07/2016   HCT 32.8 (L) 09/07/2016   PLT 302 09/07/2016   GLUCOSE 128 (H) 02/15/2017   CHOL 176 02/15/2017   TRIG 100.0 02/15/2017   HDL 80.50 02/15/2017   LDLCALC 76 02/15/2017   ALT 21 02/15/2017   AST 29 02/15/2017   NA 142 02/15/2017   K 3.9 02/15/2017   CL 104 02/15/2017   CREATININE 0.98 02/15/2017   BUN 21 02/15/2017   CO2 31 02/15/2017   PSA 0.53 02/15/2017   INR 1.01 02/16/2011   HGBA1C 6.4 02/15/2017   MICROALBUR 4.7 (H) 07/27/2016    No results found.  Assessment & Plan:   Zacharee was seen today for follow-up and tinnitus.  Diagnoses and all orders for this visit:  Controlled type 2 DM with peripheral circulatory disorder ([place]) -     Hemoglobin A1c; Future -     Basic metabolic panel; Future -     Hepatic function panel; Future -     metFORMIN (GLUCOPHAGE) 1000 MG tablet; TAKE 1 TABLET BY MOUTH TWO  TIMES DAILY WITH MEALS -     pioglitazone (ACTOS) 30 MG tablet; Take 1 tablet (30 mg total) by mouth daily.  Hypertension due to endocrine disorder -     Basic metabolic panel; Future -     Olmesartan-Amlodipine-HCTZ 40-10-12.5 MG TABS; Take 1 tablet by mouth daily. Need to see PCP for future refills.  Gastroesophageal reflux disease without esophagitis -     omeprazole (PRILOSEC) 20 MG capsule; Take 1 capsule (20 mg total) by mouth daily.  Need for influenza vaccination -     Flu vaccine HIGH DOSE PF  Benign prostatic hyperplasia with lower urinary tract symptoms, symptom details unspecified -     PSA; Future -     tamsulosin (FLOMAX) 0.4 MG CAPS  capsule; Take 1 capsule (0.4 mg total) by mouth daily. -     finasteride (PROSCAR) 5 MG tablet; Take 1 tablet (5 mg total) by mouth daily.  Encounter for lipid screening for cardiovascular disease -     Lipid panel; Future  Type 2 diabetes mellitus with diabetic polyneuropathy, without long-term current use of insulin ([place])   I have changed Mr. Prajapati tamsulosin and  finasteride. I am also having him maintain his cholecalciferol, multivitamin, aspirin, ACCU-CHEK AVIVA, Cyanocobalamin (VITAMIN B-12 PO), traMADol, meloxicam, naproxen sodium, Interferon Beta-1a, interferon beta-1a, accu-chek multiclix, ACCU-CHEK AVIVA PLUS, cloNIDine, traZODone, gabapentin, baclofen, DEPEND ADJUSTABLE UNDERWEAR LG, Olmesartan-Amlodipine-HCTZ, metFORMIN, omeprazole, and pioglitazone.  Meds ordered this encounter  Medications  . Olmesartan-Amlodipine-HCTZ 40-10-12.5 MG TABS    Sig: Take 1 tablet by mouth daily. Need to see PCP for future refills.    Dispense:  90 tablet    Refill:  0    Order Specific Question:   Supervising Provider    Answer:   Cassandria Anger [[id number]]  . tamsulosin (FLOMAX) 0.4 MG CAPS capsule    Sig: Take 1 capsule (0.4 mg total) by mouth daily.    Dispense:  90 capsule    Refill:  1    Order Specific Question:   Supervising Provider    Answer:   Cassandria Anger [[id number]]  . metFORMIN (GLUCOPHAGE) 1000 MG tablet    Sig: TAKE 1 TABLET BY MOUTH TWO  TIMES DAILY WITH MEALS    Dispense:  60 tablet    Refill:  0    Must keep appt for future refills    Order Specific Question:   Supervising Provider    Answer:   Cassandria Anger [[id number]]  . omeprazole (PRILOSEC) 20 MG capsule    Sig: Take 1 capsule (20 mg total) by mouth daily.    Dispense:  90 capsule    Refill:  1    Order Specific Question:   Supervising Provider    Answer:   Cassandria Anger [[id number]]  . finasteride (PROSCAR) 5 MG tablet    Sig: Take 1 tablet (5 mg total) by mouth daily.    Dispense:  90 tablet     Refill:  1    Order Specific Question:   Supervising Provider    Answer:   Cassandria Anger [[id number]]  . pioglitazone (ACTOS) 30 MG tablet    Sig: Take 1 tablet (30 mg total) by mouth daily.    Dispense:  90 tablet    Refill:  1    Patient needs an appt    Order Specific Question:   Supervising Provider    Answer:   Cassandria Anger [[id number]]    Follow-up: Return in about 6 months (around 08/15/2017) for DM and HTN ([place]).  Wilfred Lacy, NP

## 2017-02-15 NOTE — Patient Instructions (Signed)
Make appt for annual eye exam. Have copy or report faxed to [place].  Go to basement for blood draw.  Diabetes and Foot Care Diabetes may cause you to have problems because of poor blood supply (circulation) to your feet and legs. This may cause the skin on your feet to become thinner, break easier, and heal more slowly. Your skin may become dry, and the skin may peel and crack. You may also have nerve damage in your legs and feet causing decreased feeling in them. You may not notice minor injuries to your feet that could lead to infections or more serious problems. Taking care of your feet is one of the most important things you can do for yourself. Follow these instructions at home:  Wear shoes at all times, even in the house. Do not go barefoot. Bare feet are easily injured.  Check your feet daily for blisters, cuts, and redness. If you cannot see the bottom of your feet, use a mirror or ask someone for help.  Wash your feet with warm water (do not use hot water) and mild soap. Then pat your feet and the areas between your toes until they are completely dry. Do not soak your feet as this can dry your skin.  Apply a moisturizing lotion or petroleum jelly (that does not contain alcohol and is unscented) to the skin on your feet and to dry, brittle toenails. Do not apply lotion between your toes.  Trim your toenails straight across. Do not dig under them or around the cuticle. File the edges of your nails with an emery board or nail file.  Do not cut corns or calluses or try to remove them with medicine.  Wear clean socks or stockings every day. Make sure they are not too tight. Do not wear knee-high stockings since they may decrease blood flow to your legs.  Wear shoes that fit properly and have enough cushioning. To break in new shoes, wear them for just a few hours a day. This prevents you from injuring your feet. Always look in your shoes before you put them on to be sure there are no objects  inside.  Do not cross your legs. This may decrease the blood flow to your feet.  If you find a minor scrape, cut, or break in the skin on your feet, keep it and the skin around it clean and dry. These areas may be cleansed with mild soap and water. Do not cleanse the area with peroxide, alcohol, or iodine.  When you remove an adhesive bandage, be sure not to damage the skin around it.  If you have a wound, look at it several times a day to make sure it is healing.  Do not use heating pads or hot water bottles. They may burn your skin. If you have lost feeling in your feet or legs, you may not know it is happening until it is too late.  Make sure your health care provider performs a complete foot exam at least annually or more often if you have foot problems. Report any cuts, sores, or bruises to your health care provider immediately. Contact a health care provider if:  You have an injury that is not healing.  You have cuts or breaks in the skin.  You have an ingrown nail.  You notice redness on your legs or feet.  You feel burning or tingling in your legs or feet.  You have pain or cramps in your legs and feet.  Your legs or feet are numb.  Your feet always feel cold. Get help right away if:  There is increasing redness, swelling, or pain in or around a wound.  There is a red line that goes up your leg.  Pus is coming from a wound.  You develop a fever or as directed by your health care provider.  You notice a bad smell coming from an ulcer or wound. This information is not intended to replace advice given to you by your health care provider. Make sure you discuss any questions you have with your health care provider. Document Released: 05/15/2000 Document Revised: 10/24/2015 Document Reviewed: 10/25/2012 Elsevier Interactive Patient Education  2017 [employer].

## 2017-03-09 ENCOUNTER — Ambulatory Visit (INDEPENDENT_AMBULATORY_CARE_PROVIDER_SITE_OTHER): Payer: Medicare Other | Admitting: Neurology

## 2017-03-09 ENCOUNTER — Encounter: Payer: Self-pay | Admitting: Neurology

## 2017-03-09 VITALS — BP 129/77 | HR 85 | Ht 67.0 in | Wt 132.0 lb

## 2017-03-09 DIAGNOSIS — M4316 Spondylolisthesis, lumbar region: Secondary | ICD-10-CM | POA: Diagnosis not present

## 2017-03-09 DIAGNOSIS — G35 Multiple sclerosis: Secondary | ICD-10-CM | POA: Diagnosis not present

## 2017-03-09 DIAGNOSIS — G8220 Paraplegia, unspecified: Secondary | ICD-10-CM

## 2017-03-09 MED ORDER — OXYBUTYNIN CHLORIDE ER 5 MG PO TB24: 5.0000 mg | ORAL_TABLET | Freq: Every day | ORAL | 11 refills | Status: DC

## 2017-03-09 NOTE — Progress Notes (Signed)
HISTORY OF PRESENT ILLNESS: Larry [name] is a 66 -year-old male, accompanied by her daughter [name], seen in refer by his primary care physician Dr. Rodena Goldmann for evaluation of worsening gait difficulty, multiple sclerosis, initial evaluation was on June 12 2015.  He was a patient of our office in the past, last clinical visit was July 2013, he had a history of diabetes since 2002, also with  diagnosis of relapsing remitting multiple sclerosis, initial presentation was gait difficulty in 1996, he fell few times, he was initially diagnosed with lumbar spinal stenosis, had lumbar spine disc decompression surgery, which fail to improve his symptoms, he continued to have worsening gait difficulty, this eventually led to hospital admission at [facility], the diagnosis of MS was based on abnormal MRI findings, spinal fluid testing, after rehabilitation, he was able to ambulate again with a cane, there was no visual loss, he was started treatment with Avonex since 1996, he moved to [place] in 1999, was under the care of Dr. Delphia Grates at [facility], was treated with Betaseron for a short period of time, but he could not tolerate it due to injection side effect, went back on Avelox shortly afterwards  over the years, he denies significant relapsing episode, he had slow decline in his walking, has been ambulate with crutches, exercise regularly  He had a history of lumbar decompression surgery  in 1996 at [place] with very difficult recovery, he was in wheelchair for 6 months because of gait difficulty.  He presented to the hospital in July 2013 for worsening gait difficulty, had extensive imaging study.  I have personally reviewed MRI of the brain in 2013 showed chronic changes, cervical spine with significant cord compression at C4-5 C5-6, but no abnormal cord signal to suggest cervical MS lesions, MRI of thoracic spine showed chronic cord  compression and myelomalacia at T10-11, he was evaluated by neurosurgeon, Dr. Ellene Route, the plan was to treat him with short course of steroid, followed by thoracic elective decompression, he has made significant improvement with steroid treatment, has lost follow-up,  Over the past 3 years, he had worsening gait difficulty, he is able to walk with crutches, but with worsening bending forward posture and difficulty, recent few months, he also noticed bilateral hands paresthesia. he lives in his own apartment, has 12 steps,  he denies significant low back pain, no bowel and bladder incontinence, he exercise 3 times each week,  UPDATE April 6th 2017: He was evaluated by Dr. Ellene Route in early March 2017, per patient, he was offered decompression surgery for cervical and thoracic stenosis, he also had MRI of lumbar He is now complaining of worsening right low back pain, radiating pain to right hip and right thigh,   He also began to notice bilateral hands numbness weakness since 2003, gradually getting worse, is very difficult for him to button his shirt  I have personally reviewed MRI of lumbar in March 2017: Multilevel degenerative changes, most severe at L4-5 multifactorial severe spinal stenosis lateral recess stenosis. Marked foraminal narrowing greater on the right  UPDATE Mar 03 2016:  He had L4-5 decompressive laminectomy, decompression of  L4-5 nerve roots, posterior lumbar interbody arthrodeses with autograft and allograft by Dr. Ellene Route on December 30 2015, he responded very well, he no longer has significant right low back pain radiating to his right hip, his posture has improved, he can move better,  However since surgery, he noticed increasing incident of bowel incontinence, couple times each  week, he denies significant problem with his bladder  We again reviewed presurgical MRI in March 2017, multilevel lumbar degenerative disc disease, L4-5 severe spinal stenosis, lateral recess stenosis,  marked foraminal narrowing greater on the right, MRI of thoracic spine moderate to severe spinal stenosis at T10-11, severe bilateral foraminal stenosis, T2 hyper intensity cord signal, facet hypertrophy at T9-10, with severe bilateral foraminal stenosis, MRI of cervical spine, Mild spinal canal stenosis severe bilateral foraminal stenosis at C4-5, C5-6 and C6-7   UPDATE January 01 2017: He was evaluated by Jinny Blossom in April 2018, continue complains of slow worsening bowel incontinence, at least 1-2 times each week, 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 T8-9 mild spinal stenosis, mild right severe left foraminal stenosis, T11-12, disc bulging, facet hypertrophy with severe bilateral foraminal narrowing, T9-10, facet hypertrophy with severe by foraminal stenosis.  UPDATE Mar 09 2017: He has no low back pain, bu he complains of feet cold all the time. He has regular bowel movement. Still has occasionally bowel incontinence, urinary urgency, was seen by urologist in the past  REVIEW OF SYSTEMS: Out of a complete 14 system review of symptoms, the patient complains only of the following symptoms, and all other reviewed systems are negative. Incontinence of bowel  ALLERGIES: Allergies  Allergen Reactions  . No Known Allergies     HOME MEDICATIONS: Outpatient Medications Prior to Visit  Medication Sig Dispense Refill  . ACCU-CHEK AVIVA PLUS test strip TEST ONCE DAILY 100 each 1  . aspirin 81 MG tablet Take 81 mg by mouth daily.    . baclofen (LIORESAL) 20 MG tablet TAKE 1 TABLET BY MOUTH 3  TIMES DAILY 270 tablet 3  . Blood Glucose Calibration (ACCU-CHEK AVIVA)  SOLN     . cloNIDine (CATAPRES) 0.1 MG tablet Take 1 tablet (0.1 mg total) by mouth 2 (two) times daily. 180 tablet 0  . Cyanocobalamin (VITAMIN B-12 PO) Take 1 tablet by mouth 2 (two) times daily. Patient is unsure of dose    . finasteride (PROSCAR) 5 MG tablet Take 1 tablet (5 mg total) by mouth daily. 90 tablet 1  . gabapentin (NEURONTIN) 800 MG tablet Take 2 tablets (1,600 mg total) by mouth 2 (two) times daily. 360 tablet 4  . Incontinence Supply Disposable (DEPEND ADJUSTABLE UNDERWEAR LG) MISC Use as needed. 30 each 12  . interferon beta-1a (AVONEX) 30 MCG injection Inject 30 mcg into the muscle every 7 (seven) days. 12 each 4  . Interferon Beta-1a 30 MCG/0.5ML AJKT Inject 30 mcg into the muscle once a week. On Sunday 5 each 3  . Lancets (ACCU-CHEK MULTICLIX) lancets USE TO CHECK BLOOD GLUCOSE  ONCE DAILY 102 each 1  . meloxicam (MOBIC) 7.5 MG tablet Take 1 tablet (7.5 mg total) by mouth 2 (two) times daily. 60 tablet 6  . metFORMIN (GLUCOPHAGE) 1000 MG tablet TAKE 1 TABLET BY MOUTH TWO  TIMES DAILY WITH MEALS 60 tablet 0  . Multiple Vitamin (MULTIVITAMIN) tablet Take 1 tablet by mouth daily.    . naproxen sodium (ALEVE) 220 MG tablet Take 440 mg by mouth daily as needed.     . Olmesartan-Amlodipine-HCTZ 40-10-12.5 MG TABS Take 1 tablet by mouth daily. Need to see PCP  for future refills. 90 tablet 0  . pioglitazone (ACTOS) 30 MG tablet Take 1 tablet (30 mg total) by mouth daily. 90 tablet 1  . tamsulosin (FLOMAX) 0.4 MG CAPS capsule Take 1 capsule (0.4 mg total) by mouth daily. 90 capsule 1  . traMADol (ULTRAM) 50 MG tablet Take 1 tablet (50 mg total) by mouth at bedtime as needed for severe pain. for pain 30 tablet 5  . traZODone (DESYREL) 50 MG tablet Take 1 tablet (50 mg total) by mouth at bedtime. 90 tablet 1  . cholecalciferol (VITAMIN D) 1000 UNITS tablet Take 1,000 Units by mouth daily.    Marland Kitchen omeprazole (PRILOSEC) 20 MG capsule Take 1 capsule (20 mg total) by mouth daily. 90 capsule 1     No facility-administered medications prior to visit.     PAST MEDICAL HISTORY: Past Medical History:  Diagnosis Date  . Abnormality of gait   . Acute delirium 12/11/2011  . CTS (carpal tunnel syndrome) 09/26/2015  . Diabetes mellitus   . Hypercholesterolemia   . Hypertension   . Kidney stones   . Leukocytopenia 05/22/2013  . MS (multiple sclerosis) [facility])    sees [facility]  . T10 spinal cord injury ([place]) 12/12/2011   hx surgery 1996 with bilat gait abnormality since    PAST SURGICAL HISTORY: Past Surgical History:  Procedure Laterality Date  . BACK SURGERY    . COLONOSCOPY    . LUMBAR LAMINECTOMY      FAMILY HISTORY: Family History  Problem Relation Age of Onset  . Hypertension Mother   . Liver cancer Father   . Hypertension Sister     SOCIAL HISTORY: Social History   Social History  . Marital status: Legally Separated    Spouse name: N/A  . Number of children: 1  . Years of education: 64   Occupational History  . Disability    Social History Main Topics  . Smoking status: Never Smoker  . Smokeless tobacco: Never Used  . Alcohol use No  . Drug use: No  . Sexual activity: Not Currently   Other Topics Concern  . Not on file   Social History Narrative   Work or [place] alone, feels safe      Spiritual Beliefs: Christian      Lifestyle: works out with friend 3 days per week at [employer]; diet is poor - likes to [employer] but like unhealthy options      Right-handed.    2 cups caffeine per day.      PHYSICAL EXAM  Vitals:   03/09/17 0905  BP: 129/77  Pulse: 85  Weight: 132 lb (59.9 kg)  Height: 5\' 7"  (1.702 m)   Body mass index is 20.67 kg/m.   PHYSICAL EXAMNIATION:  Gen: NAD, conversant, well nourised, obese, well groomed                     Cardiovascular: Regular rate rhythm, no peripheral edema, warm, nontender. Eyes: Conjunctivae clear without exudates or hemorrhage Neck: Supple, no carotid  bruits. Pulmonary: Clear to auscultation bilaterally   NEUROLOGICAL EXAM:  MENTAL STATUS: Speech:    Speech is normal; fluent and spontaneous with normal comprehension.  Cognition:     Orientation to time, place and person     Normal recent and remote memory     Normal Attention span and concentration     Normal Language, naming, repeating,spontaneous speech     Fund of  knowledge   CRANIAL NERVES: CN II: Visual fields are full to confrontation. Fundoscopic exam is normal with sharp discs and no vascular changes. Pupils are round equal and briskly reactive to light. CN III, IV, VI: extraocular movement are normal. No ptosis. CN V: Facial sensation is intact to pinprick in all 3 divisions bilaterally. Corneal responses are intact.  CN VII: Face is symmetric with normal eye closure and smile. CN VIII: Hearing is normal to rubbing fingers CN IX, X: Palate elevates symmetrically. Phonation is normal. CN XI: Head turning and shoulder shrug are intact CN XII: Tongue is midline with normal movements and no atrophy.  MOTOR: Bilateral hand intrinsic muscle atrophy, mild bilateral grip, finger abduction weakness, no significant weakness at bilateral upper extremity proximal muscles. He has prominent bilateral lower extremity muscle weakness, no significant spasticity, hip flexion (R/L) 2/2, knee extension 4/4, knee flexion 4/4, ankle dorsiflexion 4//4, ankle plantar flexion 3/1.  REFLEXES: Reflexes are 3 and symmetric at the biceps, triceps, knees, and absent at ankles.    SENSORY: Intact to light touch, pinprick, positional and vibratory sensation are intact in fingers and toes.  COORDINATION: Rapid alternating movements and fine finger movements are intact. There is no dysmetria on finger-to-nose and heel-knee-shin.    GAIT/STANCE: He needs 2 crutches to ambulate.   DIAGNOSTIC DATA (LABS, IMAGING, TESTING) - I reviewed patient records, labs, notes, testing and imaging myself where  available.  Lab Results  Component Value Date   WBC 4.8 09/07/2016   HGB 10.9 (L) 09/07/2016   HCT 32.8 (L) 09/07/2016   MCV 91 09/07/2016   PLT 302 09/07/2016      Component Value Date/Time   NA 142 02/15/2017 1149   NA 144 01/04/2014 0943   K 3.9 02/15/2017 1149   K 3.8 01/04/2014 0943   CL 104 02/15/2017 1149   CO2 31 02/15/2017 1149   CO2 28 01/04/2014 0943   GLUCOSE 128 (H) 02/15/2017 1149   GLUCOSE 113 01/04/2014 0943   BUN 21 02/15/2017 1149   BUN 18.1 01/04/2014 0943   CREATININE 0.98 02/15/2017 1149   CREATININE 1.0 01/04/2014 0943   CALCIUM 10.4 02/15/2017 1149   CALCIUM 10.2 01/04/2014 0943   PROT 7.3 02/15/2017 1149   PROT 7.4 01/04/2014 0943   ALBUMIN 5.0 02/15/2017 1149   ALBUMIN 4.6 01/04/2014 0943   AST 29 02/15/2017 1149   AST 30 01/04/2014 0943   ALT 21 02/15/2017 1149   ALT 17 01/04/2014 0943   ALKPHOS 68 02/15/2017 1149   ALKPHOS 64 01/04/2014 0943   BILITOT 0.4 02/15/2017 1149   BILITOT 0.41 01/04/2014 0943   GFRNONAA >60 01/03/2016 0517   GFRNONAA >89 12/25/2011 1132   GFRAA >60 01/03/2016 0517   GFRAA >89 12/25/2011 1132   Lab Results  Component Value Date   CHOL 176 02/15/2017   HDL 80.50 02/15/2017   LDLCALC 76 02/15/2017   TRIG 100.0 02/15/2017   CHOLHDL 2 02/15/2017   Lab Results  Component Value Date   HGBA1C 6.4 02/15/2017   Lab Results  Component Value Date   VITAMINB12 748 01/01/2016   No results found for: TSH    ASSESSMENT AND PLAN 66 y.o. 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  Follow up with his urologist.   Marcial Pacas, M.D. Ph.D.  [facility] [place], [place] [address] Phone: [phone] Fax:      [phone]

## 2017-03-29 ENCOUNTER — Other Ambulatory Visit: Payer: Self-pay | Admitting: Family Medicine

## 2017-03-29 ENCOUNTER — Other Ambulatory Visit: Payer: Self-pay | Admitting: Nurse Practitioner

## 2017-03-29 ENCOUNTER — Other Ambulatory Visit: Payer: Self-pay | Admitting: Neurology

## 2017-03-29 ENCOUNTER — Other Ambulatory Visit: Payer: Self-pay | Admitting: *Deleted

## 2017-03-29 DIAGNOSIS — E1151 Type 2 diabetes mellitus with diabetic peripheral angiopathy without gangrene: Secondary | ICD-10-CM

## 2017-03-29 MED ORDER — OXYBUTYNIN CHLORIDE ER 5 MG PO TB24: 5.0000 mg | ORAL_TABLET | Freq: Every day | ORAL | 3 refills | Status: DC

## 2017-04-13 ENCOUNTER — Telehealth: Payer: Self-pay | Admitting: Neurology

## 2017-04-13 MED ORDER — TRAMADOL HCL 50 MG PO TABS: 50.0000 mg | ORAL_TABLET | Freq: Every evening | ORAL | 5 refills | Status: DC | PRN

## 2017-04-13 NOTE — Addendum Note (Signed)
Addended by: Noberto Retort C on: 04/13/2017 11:50 AM   Modules accepted: Orders

## 2017-04-13 NOTE — Telephone Encounter (Addendum)
Patient is current on appts.  Last Tramadol prescription provided on 07/08/15 for #30 x 5. No issues noted on [place] narcotic registry. Refill appropriate.  Printed, signed, faxed and confirmed to his requested pharmacy.

## 2017-04-13 NOTE — Telephone Encounter (Signed)
Pt would like to request a new prescription for his Tramadol, please send to  [facility] ([place] [place] [facility] [place] [phone] (Phone) [phone] (Fax)

## 2017-04-19 ENCOUNTER — Other Ambulatory Visit: Payer: Self-pay | Admitting: Nurse Practitioner

## 2017-04-19 ENCOUNTER — Other Ambulatory Visit: Payer: Self-pay | Admitting: Neurology

## 2017-04-19 DIAGNOSIS — E1151 Type 2 diabetes mellitus with diabetic peripheral angiopathy without gangrene: Secondary | ICD-10-CM

## 2017-04-19 DIAGNOSIS — N401 Enlarged prostate with lower urinary tract symptoms: Secondary | ICD-10-CM

## 2017-04-19 MED ORDER — INTERFERON BETA-1A 30 MCG IM KIT: 30.0000 ug | PACK | INTRAMUSCULAR | 4 refills | Status: DC

## 2017-04-19 NOTE — Telephone Encounter (Signed)
E-scribed refills Avonex to [facility] as requested per pt.

## 2017-04-19 NOTE — Telephone Encounter (Signed)
Pt request refill for interferon beta-1a (AVONEX) 30 MCG injection sent to [facility].

## 2017-04-21 ENCOUNTER — Other Ambulatory Visit: Payer: Self-pay

## 2017-04-21 MED ORDER — CLONIDINE HCL 0.1 MG PO TABS
0.1000 mg | ORAL_TABLET | Freq: Two times a day (BID) | ORAL | 0 refills | Status: DC
Start: 2017-04-21 — End: 2017-06-10

## 2017-04-23 ENCOUNTER — Telehealth: Payer: Self-pay | Admitting: Nurse Practitioner

## 2017-04-23 NOTE — Telephone Encounter (Unsigned)
Copied from [place]. Topic: General - Other >> Apr 23, 2017  9:43 AM Neva Seat wrote: Pt missed a call from office  He thinks it is in regard to medication he requested  Clonidine

## 2017-04-23 NOTE — Telephone Encounter (Signed)
Called patient regarding his Clonidine, let him know that the pharmacy had received the prescription. Pt voice understanding.

## 2017-04-27 ENCOUNTER — Telehealth: Payer: Self-pay | Admitting: Nurse Practitioner

## 2017-04-27 NOTE — Telephone Encounter (Signed)
Copied from [place]. Topic: Quick Communication - See Telephone Encounter >> Apr 27, 2017  1:46 PM Synthia Innocent wrote: CRM for notification. See Telephone encounter for:  Request refill on Olmesartan-Amlodipine-HCTZ 40-10-12.5 MG TABS  04/27/17.

## 2017-04-27 NOTE — Telephone Encounter (Signed)
Called pt. Stated he has his BP med

## 2017-05-03 DIAGNOSIS — R262 Difficulty in walking, not elsewhere classified: Secondary | ICD-10-CM | POA: Diagnosis not present

## 2017-05-03 DIAGNOSIS — G35 Multiple sclerosis: Secondary | ICD-10-CM | POA: Diagnosis not present

## 2017-05-07 DIAGNOSIS — G35 Multiple sclerosis: Secondary | ICD-10-CM | POA: Diagnosis not present

## 2017-05-07 DIAGNOSIS — R262 Difficulty in walking, not elsewhere classified: Secondary | ICD-10-CM | POA: Diagnosis not present

## 2017-05-17 ENCOUNTER — Other Ambulatory Visit: Payer: Self-pay | Admitting: Nurse Practitioner

## 2017-05-17 DIAGNOSIS — K219 Gastro-esophageal reflux disease without esophagitis: Secondary | ICD-10-CM

## 2017-05-17 DIAGNOSIS — R262 Difficulty in walking, not elsewhere classified: Secondary | ICD-10-CM | POA: Diagnosis not present

## 2017-05-17 DIAGNOSIS — G35 Multiple sclerosis: Secondary | ICD-10-CM | POA: Diagnosis not present

## 2017-05-28 DIAGNOSIS — R262 Difficulty in walking, not elsewhere classified: Secondary | ICD-10-CM | POA: Diagnosis not present

## 2017-05-28 DIAGNOSIS — G35 Multiple sclerosis: Secondary | ICD-10-CM | POA: Diagnosis not present

## 2017-05-31 DIAGNOSIS — R262 Difficulty in walking, not elsewhere classified: Secondary | ICD-10-CM | POA: Diagnosis not present

## 2017-05-31 DIAGNOSIS — G35 Multiple sclerosis: Secondary | ICD-10-CM | POA: Diagnosis not present

## 2017-06-04 DIAGNOSIS — G35 Multiple sclerosis: Secondary | ICD-10-CM | POA: Diagnosis not present

## 2017-06-04 DIAGNOSIS — R262 Difficulty in walking, not elsewhere classified: Secondary | ICD-10-CM | POA: Diagnosis not present

## 2017-06-07 ENCOUNTER — Other Ambulatory Visit: Payer: Self-pay | Admitting: Nurse Practitioner

## 2017-06-07 ENCOUNTER — Other Ambulatory Visit: Payer: Self-pay | Admitting: Neurology

## 2017-06-07 DIAGNOSIS — N401 Enlarged prostate with lower urinary tract symptoms: Secondary | ICD-10-CM

## 2017-06-07 DIAGNOSIS — E1151 Type 2 diabetes mellitus with diabetic peripheral angiopathy without gangrene: Secondary | ICD-10-CM

## 2017-06-07 DIAGNOSIS — I152 Hypertension secondary to endocrine disorders: Secondary | ICD-10-CM

## 2017-06-10 ENCOUNTER — Other Ambulatory Visit: Payer: Self-pay | Admitting: Family Medicine

## 2017-06-10 ENCOUNTER — Other Ambulatory Visit: Payer: Self-pay | Admitting: Internal Medicine

## 2017-06-10 ENCOUNTER — Other Ambulatory Visit: Payer: Self-pay | Admitting: Nurse Practitioner

## 2017-06-10 ENCOUNTER — Other Ambulatory Visit: Payer: Self-pay | Admitting: Neurology

## 2017-06-10 DIAGNOSIS — E1151 Type 2 diabetes mellitus with diabetic peripheral angiopathy without gangrene: Secondary | ICD-10-CM

## 2017-06-10 NOTE — Telephone Encounter (Signed)
Pt need to see PCP for more refills.

## 2017-06-10 NOTE — Telephone Encounter (Signed)
Need to see PCP for more refills.

## 2017-06-11 DIAGNOSIS — G35 Multiple sclerosis: Secondary | ICD-10-CM | POA: Diagnosis not present

## 2017-06-11 DIAGNOSIS — R262 Difficulty in walking, not elsewhere classified: Secondary | ICD-10-CM | POA: Diagnosis not present

## 2017-06-14 DIAGNOSIS — R262 Difficulty in walking, not elsewhere classified: Secondary | ICD-10-CM | POA: Diagnosis not present

## 2017-06-14 DIAGNOSIS — G35 Multiple sclerosis: Secondary | ICD-10-CM | POA: Diagnosis not present

## 2017-06-17 DIAGNOSIS — G35 Multiple sclerosis: Secondary | ICD-10-CM | POA: Diagnosis not present

## 2017-06-17 DIAGNOSIS — R262 Difficulty in walking, not elsewhere classified: Secondary | ICD-10-CM | POA: Diagnosis not present

## 2017-06-22 DIAGNOSIS — G35 Multiple sclerosis: Secondary | ICD-10-CM | POA: Diagnosis not present

## 2017-06-22 DIAGNOSIS — R262 Difficulty in walking, not elsewhere classified: Secondary | ICD-10-CM | POA: Diagnosis not present

## 2017-06-25 DIAGNOSIS — G35 Multiple sclerosis: Secondary | ICD-10-CM | POA: Diagnosis not present

## 2017-06-25 DIAGNOSIS — R262 Difficulty in walking, not elsewhere classified: Secondary | ICD-10-CM | POA: Diagnosis not present

## 2017-06-29 DIAGNOSIS — R262 Difficulty in walking, not elsewhere classified: Secondary | ICD-10-CM | POA: Diagnosis not present

## 2017-06-29 DIAGNOSIS — G35 Multiple sclerosis: Secondary | ICD-10-CM | POA: Diagnosis not present

## 2017-07-01 DIAGNOSIS — G35 Multiple sclerosis: Secondary | ICD-10-CM | POA: Diagnosis not present

## 2017-07-01 DIAGNOSIS — R262 Difficulty in walking, not elsewhere classified: Secondary | ICD-10-CM | POA: Diagnosis not present

## 2017-07-06 DIAGNOSIS — G35 Multiple sclerosis: Secondary | ICD-10-CM | POA: Diagnosis not present

## 2017-07-06 DIAGNOSIS — R262 Difficulty in walking, not elsewhere classified: Secondary | ICD-10-CM | POA: Diagnosis not present

## 2017-07-13 DIAGNOSIS — G35 Multiple sclerosis: Secondary | ICD-10-CM | POA: Diagnosis not present

## 2017-07-13 DIAGNOSIS — R262 Difficulty in walking, not elsewhere classified: Secondary | ICD-10-CM | POA: Diagnosis not present

## 2017-07-15 DIAGNOSIS — G35 Multiple sclerosis: Secondary | ICD-10-CM | POA: Diagnosis not present

## 2017-07-15 DIAGNOSIS — R262 Difficulty in walking, not elsewhere classified: Secondary | ICD-10-CM | POA: Diagnosis not present

## 2017-07-19 ENCOUNTER — Other Ambulatory Visit: Payer: Self-pay | Admitting: Neurology

## 2017-07-19 ENCOUNTER — Other Ambulatory Visit: Payer: Self-pay | Admitting: Nurse Practitioner

## 2017-07-19 DIAGNOSIS — N401 Enlarged prostate with lower urinary tract symptoms: Secondary | ICD-10-CM

## 2017-07-20 DIAGNOSIS — R262 Difficulty in walking, not elsewhere classified: Secondary | ICD-10-CM | POA: Diagnosis not present

## 2017-07-20 DIAGNOSIS — G35 Multiple sclerosis: Secondary | ICD-10-CM | POA: Diagnosis not present

## 2017-07-29 ENCOUNTER — Other Ambulatory Visit: Payer: Self-pay | Admitting: Neurology

## 2017-08-04 ENCOUNTER — Other Ambulatory Visit: Payer: Self-pay | Admitting: Neurology

## 2017-08-05 ENCOUNTER — Ambulatory Visit (INDEPENDENT_AMBULATORY_CARE_PROVIDER_SITE_OTHER): Payer: Medicare Other | Admitting: Nurse Practitioner

## 2017-08-05 ENCOUNTER — Encounter: Payer: Self-pay | Admitting: Nurse Practitioner

## 2017-08-05 VITALS — BP 132/74 | HR 75 | Temp 98.0°F | Ht 67.0 in | Wt 160.0 lb

## 2017-08-05 DIAGNOSIS — H6123 Impacted cerumen, bilateral: Secondary | ICD-10-CM | POA: Diagnosis not present

## 2017-08-05 DIAGNOSIS — D72819 Decreased white blood cell count, unspecified: Secondary | ICD-10-CM | POA: Diagnosis not present

## 2017-08-05 DIAGNOSIS — G629 Polyneuropathy, unspecified: Secondary | ICD-10-CM

## 2017-08-05 DIAGNOSIS — E118 Type 2 diabetes mellitus with unspecified complications: Secondary | ICD-10-CM | POA: Diagnosis not present

## 2017-08-05 MED ORDER — PREGABALIN 75 MG PO CAPS: 75.0000 mg | ORAL_CAPSULE | Freq: Every day | ORAL | 0 refills | Status: DC

## 2017-08-05 MED ORDER — GABAPENTIN 800 MG PO TABS: 1600.0000 mg | ORAL_TABLET | Freq: Every day | ORAL | 4 refills | Status: DC

## 2017-08-05 NOTE — Patient Instructions (Addendum)
Need to wean off gabapentin by: take 2caps at bedtime x 7days, then 1cap at bedtime x 14days.  Start lyrica 1cap daily x 1week, then 1cap twice a day x 14days.  Bring lab results from home visit.

## 2017-08-05 NOTE — Progress Notes (Signed)
Subjective:  Patient ID: Larry [name], male    DOB: 1950-07-09  Age: 67 y.o. MRN: [id number]  CC: Follow-up (DM and HTN/right ear ringing. )  Ear Fullness   There is pain in both ears. This is a new problem. The current episode started in the past 7 days. The problem has been unchanged. There has been no fever. The patient is experiencing no pain. Pertinent negatives include no abdominal pain, coughing, diarrhea, ear discharge, headaches, hearing loss, neck pain, rash, rhinorrhea, sore throat or vomiting. He has tried nothing for the symptoms. There is no history of a chronic ear infection or hearing loss.   DM: Reports home glucose: 100-130. Denies any hypoglycemia. Current use of actos and metformin. Has not scheduled appt with ophthalmology.  HTN: Controlled with clonidine, olmesartan, amlodipine and HCTZ. BP Readings from Last 3 Encounters:  08/05/17 132/74  03/09/17 129/77  02/15/17 120/60   Peripheral neuropathy: Chronic. Located in feet only. No improvement with gabapentin 1600mg  BID (prescribed by Dr. Krista [name]).  Reports labs were drawn by home health nurse 73month ago.  Outpatient Medications Prior to Visit  Medication Sig Dispense Refill  . ACCU-CHEK AVIVA PLUS test strip TEST ONCE DAILY 100 each 1  . aspirin 81 MG tablet Take 81 mg by mouth daily.    . baclofen (LIORESAL) 20 MG tablet TAKE 1 TABLET BY MOUTH 3  TIMES DAILY 270 tablet 3  . Blood Glucose Calibration (ACCU-CHEK AVIVA) SOLN     . cloNIDine (CATAPRES) 0.1 MG tablet TAKE 1 TABLET BY MOUTH TWO  TIMES DAILY 180 tablet 1  . finasteride (PROSCAR) 5 MG tablet TAKE 1 TABLET BY MOUTH  DAILY 90 tablet 1  . Incontinence Supply Disposable (DEPEND ADJUSTABLE UNDERWEAR LG) MISC Use as needed. 30 each 12  . interferon beta-1a (AVONEX) 30 MCG injection Inject 30 mcg every 7 (seven) days into the muscle. 12 each 4  . Interferon Beta-1a 30 MCG/0.5ML AJKT Inject 30 mcg into the muscle once a week. On Sunday 5 each 3  .  Lancets (ACCU-CHEK MULTICLIX) lancets USE TO CHECK BLOOD GLUCOSE  ONCE DAILY 102 each 1  . metFORMIN (GLUCOPHAGE) 1000 MG tablet TAKE 1 TABLET BY MOUTH TWO  TIMES DAILY WITH MEALS 180 tablet 0  . Multiple Vitamin (MULTIVITAMIN) tablet Take 1 tablet by mouth daily.    . naproxen sodium (ALEVE) 220 MG tablet Take 440 mg by mouth daily as needed.     . Olmesartan-Amlodipine-HCTZ 40-10-12.5 MG TABS TAKE 1 TABLET BY MOUTH  DAILY. 90 tablet 1  . omeprazole (PRILOSEC) 20 MG capsule TAKE 1 CAPSULE BY MOUTH  DAILY 90 capsule 1  . oxybutynin (DITROPAN-XL) 5 MG 24 hr tablet Take 1 tablet (5 mg total) by mouth at bedtime. 90 tablet 3  . pioglitazone (ACTOS) 30 MG tablet TAKE 1 TABLET BY MOUTH  DAILY 90 tablet 1  . tamsulosin (FLOMAX) 0.4 MG CAPS capsule TAKE 1 CAPSULE BY MOUTH  DAILY 90 capsule 1  . traMADol (ULTRAM) 50 MG tablet Take 1 tablet (50 mg total) at bedtime as needed by mouth for severe pain. for pain 30 tablet 5  . traZODone (DESYREL) 50 MG tablet TAKE 1 TABLET BY MOUTH AT  BEDTIME 90 tablet 1  . traZODone (DESYREL) 50 MG tablet TAKE 1 TABLET BY MOUTH AT  BEDTIME 90 tablet 3  . gabapentin (NEURONTIN) 800 MG tablet Take 2 tablets (1,600 mg total) by mouth 2 (two) times daily. 360 tablet 4  . Cyanocobalamin (VITAMIN B-12 PO)  Take 1 tablet by mouth 2 (two) times daily. Patient is unsure of dose    . meloxicam (MOBIC) 7.5 MG tablet Take 1 tablet (7.5 mg total) by mouth 2 (two) times daily. (Patient not taking: Reported on 08/05/2017) 60 tablet 6   No facility-administered medications prior to visit.     ROS See HPI  Objective:  BP 132/74   Pulse 75   Temp 98 F (36.7 C)   Ht 5\' 7"  (1.702 m)   Wt 160 lb (72.6 kg)   SpO2 95%   BMI 25.06 kg/m   BP Readings from Last 3 Encounters:  08/05/17 132/74  03/09/17 129/77  02/15/17 120/60    Wt Readings from Last 3 Encounters:  08/05/17 160 lb (72.6 kg)  03/09/17 132 lb (59.9 kg)  02/15/17 161 lb (73 kg)    Physical Exam  Constitutional:  He is oriented to person, place, and time.  HENT:  Right Ear: Tympanic membrane, external ear and ear canal normal. No middle ear effusion.  Left Ear: Tympanic membrane, external ear and ear canal normal.  No middle ear effusion.  Nose: Nose normal.  Mouth/Throat: Oropharynx is clear and moist.  Cardiovascular: Normal rate and regular rhythm.  Pulmonary/Chest: Effort normal and breath sounds normal.  Neurological: He is alert and oriented to person, place, and time.  Skin: Skin is warm and dry.  Psychiatric: He has a normal mood and affect. His behavior is normal.  Vitals reviewed.   Lab Results  Component Value Date   WBC 4.8 09/07/2016   HGB 10.9 (L) 09/07/2016   HCT 32.8 (L) 09/07/2016   PLT 302 09/07/2016   GLUCOSE 128 (H) 02/15/2017   CHOL 176 02/15/2017   TRIG 100.0 02/15/2017   HDL 80.50 02/15/2017   LDLCALC 76 02/15/2017   ALT 21 02/15/2017   AST 29 02/15/2017   NA 142 02/15/2017   K 3.9 02/15/2017   CL 104 02/15/2017   CREATININE 0.98 02/15/2017   BUN 21 02/15/2017   CO2 31 02/15/2017   PSA 0.53 02/15/2017   INR 1.01 02/16/2011   HGBA1C 6.4 02/15/2017   MICROALBUR 4.7 (H) 07/27/2016   Ceruminosis is noted.  Wax is removed by syringing and manual debridement. Instructions for home care to prevent wax buildup are given.  Procedure Note :     Procedure :  Ear irrigation(bilateral)   Indication:  Cerumen impaction (bilateral)   Risks, including pain, dizziness, eardrum perforation, bleeding, infection and others as well as benefits were explained to the patient in detail. Verbal consent was obtained and the patient agreed to proceed.    We used "The Elephant Ear Irrigation Device" filled with lukewarm water for irrigation. A large amount wax was recovered. Procedure has also required manual wax removal with an ear loop.   Tolerated well. Complications: None.   Postprocedure instructions :  Call if problems.   Assessment & Plan:   Larry [name] was seen today  for follow-up.  Diagnoses and all orders for this visit:  Type 2 diabetes mellitus with complication, without long-term current use of insulin (HCC) -     Hemoglobin A1c -     Basic metabolic panel  Leukopenia, unspecified type -     CBC w/Diff  Peripheral polyneuropathy -     gabapentin (NEURONTIN) 800 MG tablet; Take 2 tablets (1,600 mg total) by mouth at bedtime. -     pregabalin (LYRICA) 75 MG capsule; Take 1 capsule (75 mg total) by mouth daily.  Bilateral  impacted cerumen   I have changed Larry [name] gabapentin. I am also having him start on pregabalin. Additionally, I am having him maintain his multivitamin, aspirin, ACCU-CHEK AVIVA, Cyanocobalamin (VITAMIN B-12 PO), meloxicam, naproxen sodium, Interferon Beta-1a, accu-chek multiclix, ACCU-CHEK AVIVA PLUS, baclofen, DEPEND ADJUSTABLE UNDERWEAR LG, traZODone, oxybutynin, traMADol, interferon beta-1a, omeprazole, Olmesartan-amLODIPine-HCTZ, pioglitazone, finasteride, cloNIDine, metFORMIN, tamsulosin, and traZODone.  Meds ordered this encounter  Medications  . gabapentin (NEURONTIN) 800 MG tablet    Sig: Take 2 tablets (1,600 mg total) by mouth at bedtime.    Dispense:  360 tablet    Refill:  4    Order Specific Question:   Supervising Provider    Answer:   Lucille Passy [[id number]]  . pregabalin (LYRICA) 75 MG capsule    Sig: Take 1 capsule (75 mg total) by mouth daily.    Dispense:  30 capsule    Refill:  0    Order Specific Question:   Supervising Provider    Answer:   Lucille Passy [[id number]]    Follow-up: Return in about 2 weeks (around 08/19/2017) for neuropathy.Wilfred Lacy, NP

## 2017-08-09 ENCOUNTER — Encounter: Payer: Self-pay | Admitting: Nurse Practitioner

## 2017-09-01 ENCOUNTER — Telehealth: Payer: Self-pay | Admitting: Neurology

## 2017-09-01 MED ORDER — MELOXICAM 15 MG PO TABS: 15.0000 mg | ORAL_TABLET | Freq: Every day | ORAL | 2 refills | Status: DC | PRN

## 2017-09-01 NOTE — Telephone Encounter (Signed)
Pt requesting a refill for meloxicam (MOBIC) 7.5 MG tablet sent to [facility]

## 2017-09-01 NOTE — Telephone Encounter (Signed)
Dr. Krista Blue reviewed patient's chart.  Ok to provide rx for meloxicam 15mg , take one tablet daily as needed.  Rx sent to pharmacy.

## 2017-09-07 ENCOUNTER — Other Ambulatory Visit: Payer: Self-pay | Admitting: Neurology

## 2017-09-10 ENCOUNTER — Ambulatory Visit (INDEPENDENT_AMBULATORY_CARE_PROVIDER_SITE_OTHER): Payer: Medicare Other | Admitting: Nurse Practitioner

## 2017-09-10 ENCOUNTER — Encounter: Payer: Self-pay | Admitting: Nurse Practitioner

## 2017-09-10 VITALS — BP 152/76 | HR 85 | Temp 97.6°F | Wt 160.0 lb

## 2017-09-10 DIAGNOSIS — K219 Gastro-esophageal reflux disease without esophagitis: Secondary | ICD-10-CM | POA: Diagnosis not present

## 2017-09-10 DIAGNOSIS — E559 Vitamin D deficiency, unspecified: Secondary | ICD-10-CM

## 2017-09-10 DIAGNOSIS — D72819 Decreased white blood cell count, unspecified: Secondary | ICD-10-CM | POA: Diagnosis not present

## 2017-09-10 DIAGNOSIS — E1151 Type 2 diabetes mellitus with diabetic peripheral angiopathy without gangrene: Secondary | ICD-10-CM

## 2017-09-10 DIAGNOSIS — G629 Polyneuropathy, unspecified: Secondary | ICD-10-CM

## 2017-09-10 DIAGNOSIS — I152 Hypertension secondary to endocrine disorders: Secondary | ICD-10-CM

## 2017-09-10 DIAGNOSIS — E118 Type 2 diabetes mellitus with unspecified complications: Secondary | ICD-10-CM | POA: Diagnosis not present

## 2017-09-10 LAB — CBC
HCT: 32.5 % — ABNORMAL LOW (ref 39.0–52.0)
Hemoglobin: 10.9 g/dL — ABNORMAL LOW (ref 13.0–17.0)
MCHC: 33.6 g/dL (ref 30.0–36.0)
MCV: 94.3 fl (ref 78.0–100.0)
PLATELETS: 315 10*3/uL (ref 150.0–400.0)
RBC: 3.44 Mil/uL — ABNORMAL LOW (ref 4.22–5.81)
RDW: 14.7 % (ref 11.5–15.5)
WBC: 3.4 10*3/uL — ABNORMAL LOW (ref 4.0–10.5)

## 2017-09-10 LAB — BASIC METABOLIC PANEL
BUN: 24 mg/dL — AB (ref 6–23)
CO2: 28 mEq/L (ref 19–32)
CREATININE: 1.06 mg/dL (ref 0.40–1.50)
Calcium: 9.8 mg/dL (ref 8.4–10.5)
Chloride: 106 mEq/L (ref 96–112)
GFR: 73.6 mL/min (ref >60.00)
Glucose, Bld: 131 mg/dL — ABNORMAL HIGH (ref 70–99)
Potassium: 4.2 mEq/L (ref 3.5–5.1)
Sodium: 144 mEq/L (ref 135–145)

## 2017-09-10 LAB — TSH: TSH: 0.63 u[IU]/mL (ref 0.35–4.50)

## 2017-09-10 LAB — HEPATIC FUNCTION PANEL
ALBUMIN: 4.4 g/dL (ref 3.5–5.2)
ALT: 14 U/L (ref 0–53)
AST: 23 U/L (ref 0–37)
Alkaline Phosphatase: 70 U/L (ref 39–117)
BILIRUBIN TOTAL: 0.3 mg/dL (ref 0.2–1.2)
Bilirubin, Direct: 0.1 mg/dL (ref 0.0–0.3)
Total Protein: 6.7 g/dL (ref 6.0–8.3)

## 2017-09-10 LAB — HEMOGLOBIN A1C: HEMOGLOBIN A1C: 6.3 % (ref 4.6–6.5)

## 2017-09-10 MED ORDER — PREGABALIN 75 MG PO CAPS: 75.0000 mg | ORAL_CAPSULE | Freq: Two times a day (BID) | ORAL | 0 refills | Status: DC

## 2017-09-10 MED ORDER — METFORMIN HCL 1000 MG PO TABS: ORAL_TABLET | ORAL | 3 refills | Status: DC

## 2017-09-10 MED ORDER — OMEPRAZOLE 20 MG PO CPDR: 20.0000 mg | DELAYED_RELEASE_CAPSULE | Freq: Every day | ORAL | 1 refills | Status: DC

## 2017-09-10 NOTE — Progress Notes (Signed)
Subjective:  Patient ID: Larry [name], male    DOB: 1950/07/29  Age: 67 y.o. MRN: [id number]  CC: Follow-up (F/U for medication. Since coming off gabepentin has been stiff in morning. Cant tell much of a difference with Lyrica.)  HPI Larry [name] presents for re eval of lyrica use and weaning off Gabapentin. He has successfully weaned off gabapentin, but experienced worsening pain, stiffness and muscle twitching while weaning off. He is currentky taking lyrica once a day. Denies any adverse effects. Reports he has been eating a high sodium diet in last 1week (spam sandwiches and hotdogs daily)  Outpatient Medications Prior to Visit  Medication Sig Dispense Refill  . ACCU-CHEK AVIVA PLUS test strip TEST ONCE DAILY 100 each 1  . aspirin 81 MG tablet Take 81 mg by mouth daily.    . baclofen (LIORESAL) 20 MG tablet TAKE 1 TABLET BY MOUTH 3  TIMES DAILY 270 tablet 1  . Blood Glucose Calibration (ACCU-CHEK AVIVA) SOLN     . cloNIDine (CATAPRES) 0.1 MG tablet TAKE 1 TABLET BY MOUTH TWO  TIMES DAILY 180 tablet 1  . Cyanocobalamin (VITAMIN B-12 PO) Take 1 tablet by mouth 2 (two) times daily. Patient is unsure of dose    . finasteride (PROSCAR) 5 MG tablet TAKE 1 TABLET BY MOUTH  DAILY 90 tablet 1  . Incontinence Supply Disposable (DEPEND ADJUSTABLE UNDERWEAR LG) MISC Use as needed. 30 each 12  . interferon beta-1a (AVONEX) 30 MCG injection Inject 30 mcg every 7 (seven) days into the muscle. 12 each 4  . Interferon Beta-1a 30 MCG/0.5ML AJKT Inject 30 mcg into the muscle once a week. On Sunday 5 each 3  . Lancets (ACCU-CHEK MULTICLIX) lancets USE TO CHECK BLOOD GLUCOSE  ONCE DAILY 102 each 1  . meloxicam (MOBIC) 15 MG tablet Take 1 tablet (15 mg total) by mouth daily as needed for pain. 30 tablet 2  . Multiple Vitamin (MULTIVITAMIN) tablet Take 1 tablet by mouth daily.    . Olmesartan-Amlodipine-HCTZ 40-10-12.5 MG TABS TAKE 1 TABLET BY MOUTH  DAILY. 90 tablet 1  . oxybutynin (DITROPAN-XL) 5 MG 24 hr  tablet Take 1 tablet (5 mg total) by mouth at bedtime. 90 tablet 3  . pioglitazone (ACTOS) 30 MG tablet TAKE 1 TABLET BY MOUTH  DAILY 90 tablet 1  . tamsulosin (FLOMAX) 0.4 MG CAPS capsule TAKE 1 CAPSULE BY MOUTH  DAILY 90 capsule 1  . traMADol (ULTRAM) 50 MG tablet Take 1 tablet (50 mg total) at bedtime as needed by mouth for severe pain. for pain 30 tablet 5  . traZODone (DESYREL) 50 MG tablet TAKE 1 TABLET BY MOUTH AT  BEDTIME 90 tablet 3  . baclofen (LIORESAL) 20 MG tablet TAKE 1 TABLET BY MOUTH 3  TIMES DAILY 270 tablet 3  . gabapentin (NEURONTIN) 800 MG tablet Take 2 tablets (1,600 mg total) by mouth at bedtime. 360 tablet 4  . meloxicam (MOBIC) 7.5 MG tablet Take 1 tablet (7.5 mg total) by mouth 2 (two) times daily. 60 tablet 6  . metFORMIN (GLUCOPHAGE) 1000 MG tablet TAKE 1 TABLET BY MOUTH TWO  TIMES DAILY WITH MEALS 180 tablet 0  . naproxen sodium (ALEVE) 220 MG tablet Take 440 mg by mouth daily as needed.     Marland Kitchen omeprazole (PRILOSEC) 20 MG capsule TAKE 1 CAPSULE BY MOUTH  DAILY 90 capsule 1  . pregabalin (LYRICA) 75 MG capsule Take 1 capsule (75 mg total) by mouth daily. 30 capsule 0  . traZODone (  DESYREL) 50 MG tablet TAKE 1 TABLET BY MOUTH AT  BEDTIME 90 tablet 1   No facility-administered medications prior to visit.     ROS See HPI  Objective:  BP (!) 152/76 (BP Location: Left Arm, Patient Position: Sitting, Cuff Size: Normal)   Pulse 85   Temp 97.6 F (36.4 C) (Oral)   Wt 160 lb (72.6 kg)   SpO2 100%   BMI 25.06 kg/m   BP Readings from Last 3 Encounters:  09/10/17 (!) 152/76  08/05/17 132/74  03/09/17 129/77    Wt Readings from Last 3 Encounters:  09/10/17 160 lb (72.6 kg)  08/05/17 160 lb (72.6 kg)  03/09/17 132 lb (59.9 kg)    Physical Exam  Constitutional: He is oriented to person, place, and time.  Cardiovascular: Normal rate.  Pulmonary/Chest: Effort normal.  Neurological: He is alert and oriented to person, place, and time.  Psychiatric: He has a  normal mood and affect. His behavior is normal.  Vitals reviewed.   Lab Results  Component Value Date   WBC 3.4 (L) 09/10/2017   HGB 10.9 (L) 09/10/2017   HCT 32.5 (L) 09/10/2017   PLT 315.0 09/10/2017   GLUCOSE 131 (H) 09/10/2017   CHOL 176 02/15/2017   TRIG 100.0 02/15/2017   HDL 80.50 02/15/2017   LDLCALC 76 02/15/2017   ALT 14 09/10/2017   AST 23 09/10/2017   NA 144 09/10/2017   K 4.2 09/10/2017   CL 106 09/10/2017   CREATININE 1.06 09/10/2017   BUN 24 (H) 09/10/2017   CO2 28 09/10/2017   TSH 0.63 09/10/2017   PSA 0.53 02/15/2017   INR 1.01 02/16/2011   HGBA1C 6.3 09/10/2017   MICROALBUR 4.7 (H) 07/27/2016    Assessment & Plan:   Larry [name] was seen today for follow-up.  Diagnoses and all orders for this visit:  Hypertension due to endocrine disorder -     CBC -     Basic metabolic panel -     TSH  Type 2 diabetes mellitus with complication, without long-term current use of insulin (HCC) -     Basic metabolic panel -     Hepatic function panel -     Hemoglobin A1c  Peripheral polyneuropathy -     TSH -     Discontinue: pregabalin (LYRICA) 75 MG capsule; Take 1 capsule (75 mg total) by mouth 2 (two) times daily. -     Discontinue: pregabalin (LYRICA) 75 MG capsule; Take 1 capsule (75 mg total) by mouth 2 (two) times daily. -     pregabalin (LYRICA) 75 MG capsule; Take 1 capsule (75 mg total) by mouth 2 (two) times daily.  Vitamin D deficiency -     Vitamin D 1,25 dihydroxy  Leukopenia, unspecified type -     CBC  Controlled type 2 DM with peripheral circulatory disorder (HCC) -     metFORMIN (GLUCOPHAGE) 1000 MG tablet; TAKE 1 TABLET BY MOUTH TWO  TIMES DAILY WITH MEALS  Gastroesophageal reflux disease without esophagitis -     omeprazole (PRILOSEC) 20 MG capsule; Take 1 capsule (20 mg total) by mouth daily.   I have discontinued Larry [name]'s naproxen sodium and gabapentin. I have also changed his omeprazole. Additionally, I am having him maintain  his multivitamin, aspirin, ACCU-CHEK AVIVA, Cyanocobalamin (VITAMIN B-12 PO), Interferon Beta-1a, accu-chek multiclix, ACCU-CHEK AVIVA PLUS, DEPEND ADJUSTABLE UNDERWEAR LG, oxybutynin, traMADol, interferon beta-1a, Olmesartan-amLODIPine-HCTZ, pioglitazone, finasteride, cloNIDine, tamsulosin, traZODone, meloxicam, baclofen, metFORMIN, and pregabalin.  Meds ordered this encounter  Medications  . DISCONTD: pregabalin (LYRICA) 75 MG capsule    Sig: Take 1 capsule (75 mg total) by mouth 2 (two) times daily.    Dispense:  30 capsule    Refill:  0    Order Specific Question:   Supervising Provider    Answer:   Lucille Passy [[id number]]  . metFORMIN (GLUCOPHAGE) 1000 MG tablet    Sig: TAKE 1 TABLET BY MOUTH TWO  TIMES DAILY WITH MEALS    Dispense:  180 tablet    Refill:  3    Order Specific Question:   Supervising Provider    Answer:   Lucille Passy [[id number]]  . omeprazole (PRILOSEC) 20 MG capsule    Sig: Take 1 capsule (20 mg total) by mouth daily.    Dispense:  90 capsule    Refill:  1    Order Specific Question:   Supervising Provider    Answer:   Lucille Passy [[id number]]  . DISCONTD: pregabalin (LYRICA) 75 MG capsule    Sig: Take 1 capsule (75 mg total) by mouth 2 (two) times daily.    Dispense:  60 capsule    Refill:  0    Order Specific Question:   Supervising Provider    Answer:   Lucille Passy [[id number]]  . pregabalin (LYRICA) 75 MG capsule    Sig: Take 1 capsule (75 mg total) by mouth 2 (two) times daily.    Dispense:  60 capsule    Refill:  0    Order Specific Question:   Supervising Provider    Answer:   Lucille Passy [[id number]]    Follow-up: Return in about 1 month (around 10/08/2017) for HTN and neuropathy.  Wilfred Lacy, NP

## 2017-09-10 NOTE — Patient Instructions (Addendum)
Stable lab results.  Continue to check BP at home and record. Call office if BP is persistently elevated >150/80. Maintain low sodium diet  Increase Lyrica to 75mg  twice a day. Call office if no improvement in 1week. Will increase dose at that time. Will fax lyrica refill Rx after review of lab results  Stop gabapentin use.   DASH Eating Plan DASH stands for "Dietary Approaches to Stop Hypertension." The DASH eating plan is a healthy eating plan that has been shown to reduce high blood pressure (hypertension). It may also reduce your risk for type 2 diabetes, heart disease, and stroke. The DASH eating plan may also help with weight loss. What are tips for following this plan? General guidelines  Avoid eating more than 2,300 mg (milligrams) of salt (sodium) a day. If you have hypertension, you may need to reduce your sodium intake to 1,500 mg a day.  Limit alcohol intake to no more than 1 drink a day for nonpregnant women and 2 drinks a day for men. One drink equals 12 oz of beer, 5 oz of wine, or 1 oz of hard liquor.  Work with your health care provider to maintain a healthy body weight or to lose weight. Ask what an ideal weight is for you.  Get at least 30 minutes of exercise that causes your heart to beat faster (aerobic exercise) most days of the week. Activities may include walking, swimming, or biking.  Work with your health care provider or diet and nutrition specialist (dietitian) to adjust your eating plan to your individual calorie needs. Reading food labels  Check food labels for the amount of sodium per serving. Choose foods with less than 5 percent of the Daily Value of sodium. Generally, foods with less than 300 mg of sodium per serving fit into this eating plan.  To find whole grains, look for the word "whole" as the first word in the ingredient list. Shopping  Buy products labeled as "low-sodium" or "no salt added."  Buy fresh foods. Avoid canned foods and  premade or frozen meals. Cooking  Avoid adding salt when cooking. Use salt-free seasonings or herbs instead of table salt or sea salt. Check with your health care provider or pharmacist before using salt substitutes.  Do not fry foods. Cook foods using healthy methods such as baking, boiling, grilling, and broiling instead.  Cook with heart-healthy oils, such as olive, canola, soybean, or sunflower oil. Meal planning   Eat a balanced diet that includes: ? 5 or more servings of fruits and vegetables each day. At each meal, try to fill half of your plate with fruits and vegetables. ? Up to 6-8 servings of whole grains each day. ? Less than 6 oz of lean meat, poultry, or fish each day. A 3-oz serving of meat is about the same size as a deck of cards. One egg equals 1 oz. ? 2 servings of low-fat dairy each day. ? A serving of nuts, seeds, or beans 5 times each week. ? Heart-healthy fats. Healthy fats called Omega-3 fatty acids are found in foods such as flaxseeds and coldwater fish, like sardines, salmon, and mackerel.  Limit how much you eat of the following: ? Canned or prepackaged foods. ? Food that is high in trans fat, such as fried foods. ? Food that is high in saturated fat, such as fatty meat. ? Sweets, desserts, sugary drinks, and other foods with added sugar. ? Full-fat dairy products.  Do not salt foods before eating.  Try to eat at least 2 vegetarian meals each week.  Eat more home-cooked food and less restaurant, buffet, and fast food.  When eating at a restaurant, ask that your food be prepared with less salt or no salt, if possible. What foods are recommended? The items listed may not be a complete list. Talk with your dietitian about what dietary choices are best for you. Grains Whole-grain or whole-wheat bread. Whole-grain or whole-wheat pasta. Brown rice. [place]ena Morrow. Bulgur. Whole-grain and low-sodium cereals. Pita bread. Low-fat, low-sodium crackers.  Whole-wheat flour tortillas. Vegetables Fresh or frozen vegetables (raw, steamed, roasted, or grilled). Low-sodium or reduced-sodium tomato and vegetable juice. Low-sodium or reduced-sodium tomato sauce and tomato paste. Low-sodium or reduced-sodium canned vegetables. Fruits All fresh, dried, or frozen fruit. Canned fruit in natural juice (without added sugar). Meat and other protein foods Skinless chicken or [place]. Ground chicken or [place]. Pork with fat trimmed off. Fish and seafood. Egg whites. Dried beans, peas, or lentils. Unsalted nuts, nut butters, and seeds. Unsalted canned beans. Lean cuts of beef with fat trimmed off. Low-sodium, lean deli meat. Dairy Low-fat (1%) or fat-free (skim) milk. Fat-free, low-fat, or reduced-fat cheeses. Nonfat, low-sodium ricotta or cottage cheese. Low-fat or nonfat yogurt. Low-fat, low-sodium cheese. Fats and oils Soft margarine without trans fats. Vegetable oil. Low-fat, reduced-fat, or light mayonnaise and salad dressings (reduced-sodium). Canola, safflower, olive, soybean, and sunflower oils. Avocado. Seasoning and other foods Herbs. Spices. Seasoning mixes without salt. Unsalted popcorn and pretzels. Fat-free sweets. What foods are not recommended? The items listed may not be a complete list. Talk with your dietitian about what dietary choices are best for you. Grains Baked goods made with fat, such as croissants, muffins, or some breads. Dry pasta or rice meal packs. Vegetables Creamed or fried vegetables. Vegetables in a cheese sauce. Regular canned vegetables (not low-sodium or reduced-sodium). Regular canned tomato sauce and paste (not low-sodium or reduced-sodium). Regular tomato and vegetable juice (not low-sodium or reduced-sodium). Angie Fava. Olives. Fruits Canned fruit in a light or heavy syrup. Fried fruit. Fruit in cream or butter sauce. Meat and other protein foods Fatty cuts of meat. Ribs. Fried meat. Berniece Salines. Sausage. Bologna and other  processed lunch meats. Salami. Fatback. Hotdogs. Bratwurst. Salted nuts and seeds. Canned beans with added salt. Canned or smoked fish. Whole eggs or egg yolks. Chicken or [place] with skin. Dairy Whole or 2% milk, cream, and half-and-half. Whole or full-fat cream cheese. Whole-fat or sweetened yogurt. Full-fat cheese. Nondairy creamers. Whipped toppings. Processed cheese and cheese spreads. Fats and oils Butter. Stick margarine. Lard. Shortening. Ghee. Bacon fat. Tropical oils, such as coconut, palm kernel, or palm oil. Seasoning and other foods Salted popcorn and pretzels. Onion salt, garlic salt, seasoned salt, table salt, and sea salt. Worcestershire sauce. Tartar sauce. Barbecue sauce. Teriyaki sauce. Soy sauce, including reduced-sodium. Steak sauce. Canned and packaged gravies. Fish sauce. Oyster sauce. Cocktail sauce. Horseradish that you find on the shelf. Ketchup. Mustard. Meat flavorings and tenderizers. Bouillon cubes. Hot sauce and Tabasco sauce. Premade or packaged marinades. Premade or packaged taco seasonings. Relishes. Regular salad dressings. Where to find more information:  National Heart, Lung, and [place]: https://wilson-eaton.com/  American Heart Association: www.heart.org Summary  The DASH eating plan is a healthy eating plan that has been shown to reduce high blood pressure (hypertension). It may also reduce your risk for type 2 diabetes, heart disease, and stroke.  With the DASH eating plan, you should limit salt (sodium) intake to 2,300 mg a day. If  you have hypertension, you may need to reduce your sodium intake to 1,500 mg a day.  When on the DASH eating plan, aim to eat more fresh fruits and vegetables, whole grains, lean proteins, low-fat dairy, and heart-healthy fats.  Work with your health care provider or diet and nutrition specialist (dietitian) to adjust your eating plan to your individual calorie needs. This information is not intended to replace advice given to  you by your health care provider. Make sure you discuss any questions you have with your health care provider. Document Released: 05/07/2011 Document Revised: 05/11/2016 Document Reviewed: 05/11/2016 Elsevier Interactive Patient Education  [employer].

## 2017-09-13 ENCOUNTER — Encounter: Payer: Self-pay | Admitting: Nurse Practitioner

## 2017-09-13 LAB — VITAMIN D 1,25 DIHYDROXY
Vitamin D 1, 25 (OH)2 Total: 31 pg/mL (ref 18–72)
Vitamin D3 1, 25 (OH)2: 31 pg/mL

## 2017-09-15 ENCOUNTER — Telehealth: Payer: Self-pay | Admitting: Nurse Practitioner

## 2017-09-15 NOTE — Telephone Encounter (Signed)
Left message informing pt of Rx being sent to [facility] mail service. TLG

## 2017-09-15 NOTE — Telephone Encounter (Signed)
I called [facility] and canceled pt Lyrica Rx. Sent fax for [facility] to fill pt Rx.TLG

## 2017-09-15 NOTE — Telephone Encounter (Signed)
Copied from [place] [id number]. Topic: Quick Communication - Rx Refill/Question >> Sep 15, 2017  8:14 AM Lennox Solders wrote: Medication:lyrica Has the patient contacted their pharmacy no [facility]erred [facility] is calling to have rx sent to them. The rx was sent to [facility] and patient need [facility]. Pt did not pick up med from [facility]. [facility] can not have rx transfer to them

## 2017-10-08 ENCOUNTER — Other Ambulatory Visit: Payer: Self-pay | Admitting: Neurology

## 2017-10-08 ENCOUNTER — Telehealth: Payer: Self-pay | Admitting: *Deleted

## 2017-10-08 ENCOUNTER — Other Ambulatory Visit: Payer: Self-pay | Admitting: Nurse Practitioner

## 2017-10-08 DIAGNOSIS — E1151 Type 2 diabetes mellitus with diabetic peripheral angiopathy without gangrene: Secondary | ICD-10-CM

## 2017-10-08 DIAGNOSIS — I152 Hypertension secondary to endocrine disorders: Secondary | ICD-10-CM

## 2017-10-08 DIAGNOSIS — N401 Enlarged prostate with lower urinary tract symptoms: Secondary | ICD-10-CM

## 2017-10-08 DIAGNOSIS — K219 Gastro-esophageal reflux disease without esophagitis: Secondary | ICD-10-CM

## 2017-10-08 MED ORDER — TRAMADOL HCL 50 MG PO TABS: 50.0000 mg | ORAL_TABLET | Freq: Every evening | ORAL | 5 refills | Status: DC | PRN

## 2017-10-08 NOTE — Telephone Encounter (Signed)
LMOM that Tramadol rx. has been sent to [facility].  We received a request for a 90 day supply from [facility], but we do not fill controlled substances for 90 day supplies./fim

## 2017-10-08 NOTE — Telephone Encounter (Signed)
Rx sent 6 mo supply.

## 2017-10-19 ENCOUNTER — Telehealth: Payer: Self-pay | Admitting: Nurse Practitioner

## 2017-10-19 ENCOUNTER — Other Ambulatory Visit: Payer: Self-pay | Admitting: Nurse Practitioner

## 2017-10-19 ENCOUNTER — Other Ambulatory Visit: Payer: Self-pay | Admitting: Neurology

## 2017-10-19 DIAGNOSIS — G629 Polyneuropathy, unspecified: Secondary | ICD-10-CM

## 2017-10-19 DIAGNOSIS — E1151 Type 2 diabetes mellitus with diabetic peripheral angiopathy without gangrene: Secondary | ICD-10-CM

## 2017-10-19 MED ORDER — PREGABALIN 75 MG PO CAPS: 75.0000 mg | ORAL_CAPSULE | Freq: Two times a day (BID) | ORAL | 0 refills | Status: DC

## 2017-10-19 NOTE — Telephone Encounter (Signed)
Copied from [place] [id number]. Topic: Quick Communication - Rx Refill/Question >> Oct 19, 2017 12:11 PM Carolyn Stare wrote: Medication pregabalin (LYRICA) 75 MG capsule  Has the patient contacted their pharmacy yes     Preferred Pharm[facility]   Agent: Please be advised that RX refills may take up to 3 business days. We ask that you follow-up with your pharmacy.

## 2017-10-19 NOTE — Telephone Encounter (Signed)
rx faxed to pharmacy. Pt needs to see PCP fore more refills.

## 2017-10-26 ENCOUNTER — Other Ambulatory Visit: Payer: Self-pay | Admitting: Nurse Practitioner

## 2017-10-26 DIAGNOSIS — E1151 Type 2 diabetes mellitus with diabetic peripheral angiopathy without gangrene: Secondary | ICD-10-CM

## 2017-11-16 ENCOUNTER — Other Ambulatory Visit: Payer: Self-pay | Admitting: Neurology

## 2017-11-16 ENCOUNTER — Telehealth: Payer: Self-pay | Admitting: Nurse Practitioner

## 2017-11-16 NOTE — Telephone Encounter (Signed)
Received request refill for Lyrica 75 mg Cap from [facility]. Pt needs to make an appt for more refills.   Last refills sent in 10/19/2017 for 45 days supply.

## 2017-11-18 ENCOUNTER — Other Ambulatory Visit: Payer: Self-pay | Admitting: *Deleted

## 2017-11-18 MED ORDER — MELOXICAM 15 MG PO TABS: 15.0000 mg | ORAL_TABLET | Freq: Every day | ORAL | 0 refills | Status: DC | PRN

## 2017-11-18 NOTE — Telephone Encounter (Signed)
Pt has an appt with [facility] 11/29/2017 and has enough med until then.

## 2017-11-29 ENCOUNTER — Other Ambulatory Visit: Payer: Self-pay | Admitting: Neurology

## 2017-11-29 ENCOUNTER — Other Ambulatory Visit: Payer: Self-pay | Admitting: Family Medicine

## 2017-11-29 ENCOUNTER — Ambulatory Visit (INDEPENDENT_AMBULATORY_CARE_PROVIDER_SITE_OTHER): Payer: Medicare Other | Admitting: Nurse Practitioner

## 2017-11-29 ENCOUNTER — Encounter: Payer: Self-pay | Admitting: Nurse Practitioner

## 2017-11-29 VITALS — BP 126/66 | HR 93 | Temp 98.3°F | Ht 67.0 in | Wt 146.0 lb

## 2017-11-29 DIAGNOSIS — R339 Retention of urine, unspecified: Secondary | ICD-10-CM | POA: Diagnosis not present

## 2017-11-29 DIAGNOSIS — E118 Type 2 diabetes mellitus with unspecified complications: Secondary | ICD-10-CM

## 2017-11-29 DIAGNOSIS — I152 Hypertension secondary to endocrine disorders: Secondary | ICD-10-CM

## 2017-11-29 DIAGNOSIS — G629 Polyneuropathy, unspecified: Secondary | ICD-10-CM

## 2017-11-29 MED ORDER — PREGABALIN 75 MG PO CAPS: 75.0000 mg | ORAL_CAPSULE | Freq: Two times a day (BID) | ORAL | 0 refills | Status: DC

## 2017-11-29 MED ORDER — ACCU-CHEK MULTICLIX LANCETS MISC: 1 refills | Status: DC

## 2017-11-29 MED ORDER — PREGABALIN 75 MG PO CAPS: 75.0000 mg | ORAL_CAPSULE | Freq: Two times a day (BID) | ORAL | 1 refills | Status: DC

## 2017-11-29 MED ORDER — GLUCOSE BLOOD VI STRP: ORAL_STRIP | 3 refills | Status: DC

## 2017-11-29 NOTE — Patient Instructions (Addendum)
Medication refills have been sent.  Schedule appt with ophthalmology and Dentist.  Please make appt with Dr. Krista Blue to discuss worsening muscle stiffness.  I do not recommend increase in lyrica dose at this time since your symptoms are under control. Due to disease process of DM and MS, symptoms can not be eliminated 100%. The goal of treatment is to control symptoms so you can continue ADLs.

## 2017-11-29 NOTE — Progress Notes (Signed)
Subjective:  Patient ID: Larry [name], male    DOB: [birth year]  Age: 67 y.o. MRN: [id number]  CC: Follow-up (lyrica refills consult--dosage +/ not feel different/ lower extremilties stiff in the AM consult)  HPI   DM: Controlled with actos and metformin. Checks glucose once a day, ranges in 100s-110s. Denies any hypoglycemia.  Has not appt with ophthalmology or dentist.  Neuropathy: Pain controlled with current dose of lyrica. Larry [name] will like to know if symptoms can be completely eliminated.   BPH with Urinary Retention: use of proscar, flomax and ditropan. Denies any LUTS.  HTN:  stable with diet. No medication use at this time. BP Readings from Last 3 Encounters:  11/29/17 126/66  09/10/17 (!) 152/76  08/05/17 132/74    Reviewed [place] today.  Outpatient Medications Prior to Visit  Medication Sig Dispense Refill  . aspirin 81 MG tablet Take 81 mg by mouth daily.    . baclofen (LIORESAL) 20 MG tablet TAKE 1 TABLET BY MOUTH 3  TIMES DAILY 270 tablet 1  . Blood Glucose Calibration (ACCU-CHEK AVIVA) SOLN     . cloNIDine (CATAPRES) 0.1 MG tablet TAKE 1 TABLET BY MOUTH TWO  TIMES DAILY 180 tablet 1  . finasteride (PROSCAR) 5 MG tablet TAKE 1 TABLET BY MOUTH  DAILY 90 tablet 1  . Incontinence Supply Disposable (DEPEND ADJUSTABLE UNDERWEAR LG) MISC Use as needed. 30 each 12  . interferon beta-1a (AVONEX) 30 MCG injection Inject 30 mcg every 7 (seven) days into the muscle. 12 each 4  . Interferon Beta-1a 30 MCG/0.5ML AJKT Inject 30 mcg into the muscle once a week. On Sunday 5 each 3  . meloxicam (MOBIC) 15 MG tablet Take 1 tablet (15 mg total) by mouth daily as needed for pain. 90 tablet 0  . metFORMIN (GLUCOPHAGE) 1000 MG tablet TAKE 1 TABLET BY MOUTH TWO  TIMES DAILY WITH MEALS 180 tablet 3  . Multiple Vitamin (MULTIVITAMIN) tablet Take 1 tablet by mouth daily.    . Olmesartan-amLODIPine-HCTZ 40-10-12.5 MG TABS TAKE 1 TABLET BY MOUTH  DAILY 90 tablet 1  . omeprazole (PRILOSEC)  20 MG capsule TAKE 1 CAPSULE BY MOUTH  DAILY 90 capsule 1  . oxybutynin (DITROPAN-XL) 5 MG 24 hr tablet TAKE 1 TABLET BY MOUTH AT  BEDTIME 90 tablet 1  . pioglitazone (ACTOS) 30 MG tablet TAKE 1 TABLET BY MOUTH  DAILY 90 tablet 1  . tamsulosin (FLOMAX) 0.4 MG CAPS capsule TAKE 1 CAPSULE BY MOUTH  DAILY 90 capsule 1  . traMADol (ULTRAM) 50 MG tablet Take 1 tablet (50 mg total) by mouth at bedtime as needed for severe pain. for pain 30 tablet 5  . traZODone (DESYREL) 50 MG tablet TAKE 1 TABLET BY MOUTH AT  BEDTIME 90 tablet 3  . ACCU-CHEK AVIVA PLUS test strip TEST ONCE DAILY 100 each 1  . Lancets (ACCU-CHEK MULTICLIX) lancets USE TO CHECK BLOOD GLUCOSE  ONCE DAILY 102 each 1  . pregabalin (LYRICA) 75 MG capsule Take 1 capsule (75 mg total) by mouth 2 (two) times daily. 90 capsule 0  . Cyanocobalamin (VITAMIN B-12 PO) Take 1 tablet by mouth 2 (two) times daily. Patient is unsure of dose     No facility-administered medications prior to visit.     ROS See HPI  Objective:  BP 126/66   Pulse 93   Temp 98.3 F (36.8 C) (Oral)   Ht 5\' 7"  (1.702 m)   Wt 146 lb (66.2 kg)   SpO2 97%  BMI 22.87 kg/m   BP Readings from Last 3 Encounters:  11/29/17 126/66  09/10/17 (!) 152/76  08/05/17 132/74    Wt Readings from Last 3 Encounters:  11/29/17 146 lb (66.2 kg)  09/10/17 160 lb (72.6 kg)  08/05/17 160 lb (72.6 kg)    Physical Exam  Constitutional: Larry [name] is oriented to person, place, and time. No distress.  Neck: Normal range of motion. Neck supple.  Cardiovascular: Normal rate, normal heart sounds and intact distal pulses.  Pulmonary/Chest: Effort normal and breath sounds normal.  Abdominal: Soft. Bowel sounds are normal.  Musculoskeletal: Larry [name] exhibits no edema.  Neurological: Larry [name] is alert and oriented to person, place, and time.  Vitals reviewed.   Lab Results  Component Value Date   WBC 3.4 (L) 09/10/2017   HGB 10.9 (L) 09/10/2017   HCT 32.5 (L) 09/10/2017   PLT 315.0 09/10/2017     GLUCOSE 131 (H) 09/10/2017   CHOL 176 02/15/2017   TRIG 100.0 02/15/2017   HDL 80.50 02/15/2017   LDLCALC 76 02/15/2017   ALT 14 09/10/2017   AST 23 09/10/2017   NA 144 09/10/2017   K 4.2 09/10/2017   CL 106 09/10/2017   CREATININE 1.06 09/10/2017   BUN 24 (H) 09/10/2017   CO2 28 09/10/2017   TSH 0.63 09/10/2017   PSA 0.53 02/15/2017   INR 1.01 02/16/2011   HGBA1C 6.3 09/10/2017   MICROALBUR 4.7 (H) 07/27/2016    No results found.  Assessment & Plan:   Larry [name] was seen today for follow-up.  Diagnoses and all orders for this visit:  Peripheral polyneuropathy -     Discontinue: pregabalin (LYRICA) 75 MG capsule; Take 1 capsule (75 mg total) by mouth 2 (two) times daily. -     pregabalin (LYRICA) 75 MG capsule; Take 1 capsule (75 mg total) by mouth 2 (two) times daily.  Hypertension due to endocrine disorder  Type 2 diabetes mellitus with complication, without long-term current use of insulin (HCC) -     glucose blood (ACCU-CHEK AVIVA PLUS) test strip; TEST ONCE DAILY (AM fasting). E11.8 -     Lancets (ACCU-CHEK MULTICLIX) lancets; USE TO CHECK BLOOD GLUCOSE  ONCE DAILY  Urinary retention   I have changed Larry [name]'s ACCU-CHEK AVIVA PLUS to glucose blood. I am also having him maintain his multivitamin, aspirin, ACCU-CHEK AVIVA, Cyanocobalamin (VITAMIN B-12 PO), Interferon Beta-1a, DEPEND ADJUSTABLE UNDERWEAR LG, interferon beta-1a, traZODone, baclofen, pioglitazone, finasteride, omeprazole, tamsulosin, cloNIDine, Olmesartan-amLODIPine-HCTZ, traMADol, metFORMIN, oxybutynin, meloxicam, accu-chek multiclix, and pregabalin.  Meds ordered this encounter  Medications  . glucose blood (ACCU-CHEK AVIVA PLUS) test strip    Sig: TEST ONCE DAILY (AM fasting). E11.8    Dispense:  100 each    Refill:  3  . Lancets (ACCU-CHEK MULTICLIX) lancets    Sig: USE TO CHECK BLOOD GLUCOSE  ONCE DAILY    Dispense:  102 each    Refill:  1    Order Specific Question:   Supervising  Provider    Answer:   Lucille Passy [[id number]]  . DISCONTD: pregabalin (LYRICA) 75 MG capsule    Sig: Take 1 capsule (75 mg total) by mouth 2 (two) times daily.    Dispense:  90 capsule    Refill:  0    Needs to see PCP for more refills.    Order Specific Question:   Supervising Provider    Answer:   Lucille Passy [[id number]]  . pregabalin (LYRICA) 75 MG capsule    Sig: Take  1 capsule (75 mg total) by mouth 2 (two) times daily.    Dispense:  90 capsule    Refill:  1    Needs to see PCP for more refills.    Order Specific Question:   Supervising Provider    Answer:   Lucille Passy [[id number]]    Follow-up: Return in about 6 months (around 06/01/2018) for DM and HTN, hyperlipidemia (fasting).  Wilfred Lacy, NP

## 2017-12-22 ENCOUNTER — Other Ambulatory Visit: Payer: Self-pay | Admitting: Neurology

## 2018-01-06 ENCOUNTER — Other Ambulatory Visit: Payer: Self-pay | Admitting: Neurology

## 2018-01-27 ENCOUNTER — Other Ambulatory Visit: Payer: Self-pay | Admitting: Neurology

## 2018-01-27 ENCOUNTER — Other Ambulatory Visit: Payer: Self-pay | Admitting: Nurse Practitioner

## 2018-01-27 DIAGNOSIS — G629 Polyneuropathy, unspecified: Secondary | ICD-10-CM

## 2018-01-27 NOTE — Telephone Encounter (Signed)
Larry [name] please help, we sent in 90 tab with 1 refill on 11/2017 but the pt is taking twice daily.   Okey to send in rx until he comes in for follow up?

## 2018-02-09 ENCOUNTER — Ambulatory Visit (INDEPENDENT_AMBULATORY_CARE_PROVIDER_SITE_OTHER): Payer: Medicare Other | Admitting: Nurse Practitioner

## 2018-02-09 ENCOUNTER — Other Ambulatory Visit: Payer: Self-pay

## 2018-02-09 ENCOUNTER — Encounter: Payer: Self-pay | Admitting: Nurse Practitioner

## 2018-02-09 VITALS — BP 130/68 | HR 85 | Temp 98.6°F | Ht 67.0 in | Wt 157.0 lb

## 2018-02-09 DIAGNOSIS — G629 Polyneuropathy, unspecified: Secondary | ICD-10-CM

## 2018-02-09 DIAGNOSIS — Z1211 Encounter for screening for malignant neoplasm of colon: Secondary | ICD-10-CM

## 2018-02-09 DIAGNOSIS — M48061 Spinal stenosis, lumbar region without neurogenic claudication: Secondary | ICD-10-CM

## 2018-02-09 DIAGNOSIS — K219 Gastro-esophageal reflux disease without esophagitis: Secondary | ICD-10-CM

## 2018-02-09 DIAGNOSIS — G8220 Paraplegia, unspecified: Secondary | ICD-10-CM

## 2018-02-09 DIAGNOSIS — I152 Hypertension secondary to endocrine disorders: Secondary | ICD-10-CM

## 2018-02-09 DIAGNOSIS — D509 Iron deficiency anemia, unspecified: Secondary | ICD-10-CM | POA: Insufficient documentation

## 2018-02-09 DIAGNOSIS — M4802 Spinal stenosis, cervical region: Secondary | ICD-10-CM

## 2018-02-09 DIAGNOSIS — M4316 Spondylolisthesis, lumbar region: Secondary | ICD-10-CM

## 2018-02-09 DIAGNOSIS — M5416 Radiculopathy, lumbar region: Secondary | ICD-10-CM

## 2018-02-09 DIAGNOSIS — G35 Multiple sclerosis: Secondary | ICD-10-CM

## 2018-02-09 DIAGNOSIS — E1151 Type 2 diabetes mellitus with diabetic peripheral angiopathy without gangrene: Secondary | ICD-10-CM

## 2018-02-09 DIAGNOSIS — E118 Type 2 diabetes mellitus with unspecified complications: Secondary | ICD-10-CM

## 2018-02-09 DIAGNOSIS — M6281 Muscle weakness (generalized): Secondary | ICD-10-CM | POA: Diagnosis not present

## 2018-02-09 DIAGNOSIS — N401 Enlarged prostate with lower urinary tract symptoms: Secondary | ICD-10-CM

## 2018-02-09 LAB — IBC PANEL
Iron: 80 ug/dL (ref 42–165)
Saturation Ratios: 19.4 % — ABNORMAL LOW (ref 20.0–50.0)
TRANSFERRIN: 295 mg/dL (ref 212.0–360.0)

## 2018-02-09 LAB — CBC WITH DIFFERENTIAL/PLATELET
BASOS ABS: 0 10*3/uL (ref 0.0–0.1)
Basophils Relative: 0.8 % (ref 0.0–3.0)
EOS PCT: 0.7 % (ref 0.0–5.0)
Eosinophils Absolute: 0 10*3/uL (ref 0.0–0.7)
HCT: 35.2 % — ABNORMAL LOW (ref 39.0–52.0)
Hemoglobin: 11.9 g/dL — ABNORMAL LOW (ref 13.0–17.0)
LYMPHS ABS: 0.5 10*3/uL — AB (ref 0.7–4.0)
Lymphocytes Relative: 15.7 % (ref 12.0–46.0)
MCHC: 33.9 g/dL (ref 30.0–36.0)
MCV: 92.9 fl (ref 78.0–100.0)
MONO ABS: 0.1 10*3/uL (ref 0.1–1.0)
Monocytes Relative: 4.7 % (ref 3.0–12.0)
NEUTROS ABS: 2.4 10*3/uL (ref 1.4–7.7)
NEUTROS PCT: 78.1 % — AB (ref 43.0–77.0)
PLATELETS: 286 10*3/uL (ref 150.0–400.0)
RBC: 3.79 Mil/uL — AB (ref 4.22–5.81)
RDW: 15.3 % (ref 11.5–15.5)
WBC: 3.1 10*3/uL — ABNORMAL LOW (ref 4.0–10.5)

## 2018-02-09 LAB — BASIC METABOLIC PANEL
BUN: 26 mg/dL — ABNORMAL HIGH (ref 6–23)
CALCIUM: 10.1 mg/dL (ref 8.4–10.5)
CO2: 28 mEq/L (ref 19–32)
CREATININE: 1.03 mg/dL (ref 0.40–1.50)
Chloride: 102 mEq/L (ref 96–112)
GFR: 75.99 mL/min (ref >60.00)
Glucose, Bld: 115 mg/dL — ABNORMAL HIGH (ref 70–99)
Potassium: 3.6 mEq/L (ref 3.5–5.1)
SODIUM: 140 meq/L (ref 135–145)

## 2018-02-09 LAB — HEMOGLOBIN A1C: HEMOGLOBIN A1C: 6.3 % (ref 4.6–6.5)

## 2018-02-09 MED ORDER — CLONIDINE HCL 0.1 MG PO TABS: 0.1000 mg | ORAL_TABLET | Freq: Two times a day (BID) | ORAL | 3 refills | Status: DC

## 2018-02-09 MED ORDER — FERROUS SULFATE 325 (65 FE) MG PO TABS: 325.0000 mg | ORAL_TABLET | Freq: Every day | ORAL | 3 refills | Status: DC

## 2018-02-09 MED ORDER — OMEPRAZOLE 20 MG PO CPDR: 20.0000 mg | DELAYED_RELEASE_CAPSULE | Freq: Every day | ORAL | 3 refills | Status: DC

## 2018-02-09 MED ORDER — FINASTERIDE 5 MG PO TABS: 5.0000 mg | ORAL_TABLET | Freq: Every day | ORAL | 3 refills | Status: DC

## 2018-02-09 MED ORDER — OLMESARTAN-AMLODIPINE-HCTZ 40-10-12.5 MG PO TABS: 1.0000 | ORAL_TABLET | Freq: Every day | ORAL | 3 refills | Status: DC

## 2018-02-09 MED ORDER — PREGABALIN 75 MG PO CAPS: 75.0000 mg | ORAL_CAPSULE | Freq: Two times a day (BID) | ORAL | 1 refills | Status: DC

## 2018-02-09 MED ORDER — METFORMIN HCL 1000 MG PO TABS: 1000.0000 mg | ORAL_TABLET | Freq: Two times a day (BID) | ORAL | 3 refills | Status: DC

## 2018-02-09 MED ORDER — ACCU-CHEK FASTCLIX LANCET KIT: 1.0000 [IU] | PACK | Freq: Every day | 11 refills | Status: AC

## 2018-02-09 MED ORDER — PIOGLITAZONE HCL 30 MG PO TABS: 30.0000 mg | ORAL_TABLET | Freq: Every day | ORAL | 3 refills | Status: DC

## 2018-02-09 NOTE — Progress Notes (Signed)
Subjective:  Patient ID: Larry [name], male    DOB: July 25, 1950  Age: 67 y.o. MRN: [id number]  CC: Follow-up (patient is complaining of fatigue in the morning everyday, legs is getting weaker thats why he is requesting home health aid to help cooking,ADL. request written rx for wheelchair and refill for lancet for fast click. aspirin consult?)  HPI Muscle weakness and Fatigue:  concerned about progressive LE weakness and fatigue over several months Having difficulty with ADLs. Lives in 2 story home, bedroom is upstairs Friend helps with transportation, support from church family and daughter. Daughter visit every other week and helps with grocery shopping. Daily activity consist of Bathing, breakfast, work on Teaching laboratory technician, and watch TV, takes part in non Designer, jewellery at [employer] and lunch buddy at school). He stopped going to gym, and chair exercise due to increased weakness and fatigue Denies depression and anxiety. No change in GI or GU function Denies any food insecurity, but admits to not always eating a balanced diet. Fragmented sleep: chronic, no change, wakes up every 3hrs, nocturia(2-3x/night) Upcoming appt with neurology 03/23/2018. He will like home health PT and nursing.  Reviewed past Medical, Social and Family history today.  Outpatient Medications Prior to Visit  Medication Sig Dispense Refill  . aspirin 81 MG tablet Take 81 mg by mouth daily.    . baclofen (LIORESAL) 20 MG tablet TAKE 1 TABLET BY MOUTH 3  TIMES DAILY 270 tablet 1  . Blood Glucose Calibration (ACCU-CHEK AVIVA) SOLN     . Cyanocobalamin (VITAMIN B-12 PO) Take 1 tablet by mouth 2 (two) times daily. Patient is unsure of dose    . glucose blood (ACCU-CHEK AVIVA PLUS) test strip TEST ONCE DAILY (AM fasting). E11.8 100 each 3  . Incontinence Supply Disposable (DEPEND ADJUSTABLE UNDERWEAR LG) MISC Use as needed. 30 each 12  . interferon beta-1a (AVONEX) 30 MCG injection Inject 30 mcg every 7  (seven) days into the muscle. 12 each 4  . Interferon Beta-1a 30 MCG/0.5ML AJKT Inject 30 mcg into the muscle once a week. On Sunday 5 each 3  . meloxicam (MOBIC) 15 MG tablet Take 1 tablet (15 mg total) by mouth daily as needed for pain. 90 tablet 0  . Multiple Vitamin (MULTIVITAMIN) tablet Take 1 tablet by mouth daily.    Marland Kitchen oxybutynin (DITROPAN-XL) 5 MG 24 hr tablet TAKE 1 TABLET BY MOUTH AT  BEDTIME 90 tablet 1  . tamsulosin (FLOMAX) 0.4 MG CAPS capsule TAKE 1 CAPSULE BY MOUTH  DAILY 90 capsule 1  . traMADol (ULTRAM) 50 MG tablet Take 1 tablet (50 mg total) by mouth at bedtime as needed for severe pain. for pain 30 tablet 5  . traZODone (DESYREL) 50 MG tablet TAKE 1 TABLET BY MOUTH AT  BEDTIME 90 tablet 3  . cloNIDine (CATAPRES) 0.1 MG tablet TAKE 1 TABLET BY MOUTH TWO  TIMES DAILY 180 tablet 1  . finasteride (PROSCAR) 5 MG tablet TAKE 1 TABLET BY MOUTH  DAILY 90 tablet 1  . metFORMIN (GLUCOPHAGE) 1000 MG tablet TAKE 1 TABLET BY MOUTH TWO  TIMES DAILY WITH MEALS 180 tablet 3  . Olmesartan-amLODIPine-HCTZ 40-10-12.5 MG TABS TAKE 1 TABLET BY MOUTH  DAILY 90 tablet 1  . omeprazole (PRILOSEC) 20 MG capsule TAKE 1 CAPSULE BY MOUTH  DAILY 90 capsule 1  . pioglitazone (ACTOS) 30 MG tablet TAKE 1 TABLET BY MOUTH  DAILY 90 tablet 1  . pregabalin (LYRICA) 75 MG capsule TAKE 1 CAPSULE BY MOUTH  TWICE  DAILY 180 capsule 1  . Lancets (ACCU-CHEK MULTICLIX) lancets USE TO CHECK BLOOD GLUCOSE  ONCE DAILY (Patient not taking: Reported on 02/09/2018) 102 each 1   No facility-administered medications prior to visit.     ROS Review of Systems  Constitutional: Positive for malaise/fatigue. Negative for chills, fever and weight loss.  Respiratory: Negative.   Cardiovascular: Negative.   Gastrointestinal: Negative for abdominal pain, blood in stool, constipation, diarrhea, melena, nausea and vomiting.  Genitourinary: Positive for frequency. Negative for dysuria and urgency.  Musculoskeletal: Negative for falls  and myalgias.  Skin: Negative.   Neurological: Positive for sensory change and weakness.  Psychiatric/Behavioral: Negative for depression, memory loss and substance abuse. The patient has insomnia. The patient is not nervous/anxious.    Objective:  BP 130/68   Pulse 85   Temp 98.6 F (37 C) (Oral)   Ht '5\' 7"'  (1.702 m)   Wt 157 lb (71.2 kg)   SpO2 99%   BMI 24.59 kg/m   BP Readings from Last 3 Encounters:  02/09/18 130/68  11/29/17 126/66  09/10/17 (!) 152/76    Wt Readings from Last 3 Encounters:  02/09/18 157 lb (71.2 kg)  11/29/17 146 lb (66.2 kg)  09/10/17 160 lb (72.6 kg)    Physical Exam  Constitutional: He appears well-developed and well-nourished.  Cardiovascular: Normal rate.  Pulmonary/Chest: Effort normal.  Musculoskeletal: He exhibits no edema.  Atrophy of gluteal muscles and lower extremities. Weakness of proximal and distal muscles in bilateral LE Weakness and atrophy of bilateral hand muscles. No edema, no joint tenderness. Ambulates with crutches but leans forward. Difficulty getting out of chair.   Psychiatric: He has a normal mood and affect. His speech is normal and behavior is normal. Judgment and thought content normal. Cognition and memory are normal.  Vitals reviewed.   Lab Results  Component Value Date   WBC 3.1 (L) 02/09/2018   HGB 11.9 (L) 02/09/2018   HCT 35.2 (L) 02/09/2018   PLT 286.0 02/09/2018   GLUCOSE 115 (H) 02/09/2018   CHOL 176 02/15/2017   TRIG 100.0 02/15/2017   HDL 80.50 02/15/2017   LDLCALC 76 02/15/2017   ALT 14 09/10/2017   AST 23 09/10/2017   NA 140 02/09/2018   K 3.6 02/09/2018   CL 102 02/09/2018   CREATININE 1.03 02/09/2018   BUN 26 (H) 02/09/2018   CO2 28 02/09/2018   TSH 0.63 09/10/2017   PSA 0.53 02/15/2017   INR 1.01 02/16/2011   HGBA1C 6.3 02/09/2018   MICROALBUR 4.7 (H) 07/27/2016    Assessment & Plan:  Advised Larry [name] about need to maintain a balanced diet; focusing on adequate protein and  fiber intake. Adequate protein intake and regular exercise to maintain muscle mass, strength and mobility. Adequate oral hydration and fiber intake to avoid constipation. Recommend use of Ensure with high protein as meal supplement.  Larry [name] was seen today for follow-up.  Diagnoses and all orders for this visit:  MS (multiple sclerosis) ([place]) -     DME Wheelchair manual -     Ambulatory referral to [place]  Paraplegia [facility]) -     DME Wheelchair manual -     Ambulatory referral to [place]  Peripheral polyneuropathy -     DME Wheelchair manual -     Ambulatory referral to [place] -     cloNIDine (CATAPRES) 0.1 MG tablet; Take 1 tablet (0.1 mg total) by mouth 2 (two) times daily. -     pregabalin (  LYRICA) 75 MG capsule; Take 1 capsule (75 mg total) by mouth 2 (two) times daily.  Muscle weakness of lower extremity -     CBC w/Diff -     Basic metabolic panel -     Ambulatory referral to Home Health  Type 2 diabetes mellitus with complication, without long-term current use of insulin (HCC) -     Lancets Misc. (ACCU-CHEK FASTCLIX LANCET) KIT; 1 Units by Does not apply route daily before breakfast. -     Hemoglobin A1c  Iron deficiency anemia, unspecified iron deficiency anemia type -     CBC w/Diff -     IBC panel -     ferrous sulfate 325 (65 FE) MG tablet; Take 1 tablet (325 mg total) by mouth daily with breakfast.  Spondylolisthesis of lumbar region -     Ambulatory referral to [place]  Spinal stenosis in cervical region -     Ambulatory referral to [place]  Spinal stenosis of lumbar region with radiculopathy -     Ambulatory referral to [place]  Benign prostatic hyperplasia with lower urinary tract symptoms, symptom details unspecified -     finasteride (PROSCAR) 5 MG tablet; Take 1 tablet (5 mg total) by mouth daily.  Controlled type 2 DM with peripheral circulatory disorder (HCC) -     metFORMIN (GLUCOPHAGE) 1000 MG tablet; Take 1 tablet (1,000  mg total) by mouth 2 (two) times daily with a meal. -     pioglitazone (ACTOS) 30 MG tablet; Take 1 tablet (30 mg total) by mouth daily.  Hypertension due to endocrine disorder -     Olmesartan-amLODIPine-HCTZ 40-10-12.5 MG TABS; Take 1 tablet by mouth daily. -     cloNIDine (CATAPRES) 0.1 MG tablet; Take 1 tablet (0.1 mg total) by mouth 2 (two) times daily.  Gastroesophageal reflux disease without esophagitis -     omeprazole (PRILOSEC) 20 MG capsule; Take 1 capsule (20 mg total) by mouth daily.   I have discontinued Larry [name]'s accu-chek multiclix. I have also changed his finasteride, metFORMIN, Olmesartan-amLODIPine-HCTZ, cloNIDine, omeprazole, pioglitazone, and pregabalin. Additionally, I am having him start on ACCU-CHEK FASTCLIX LANCET and ferrous sulfate. Lastly, I am having him maintain his multivitamin, aspirin, ACCU-CHEK AVIVA, Cyanocobalamin (VITAMIN B-12 PO), Interferon Beta-1a, DEPEND ADJUSTABLE UNDERWEAR LG, interferon beta-1a, traZODone, baclofen, tamsulosin, traMADol, oxybutynin, meloxicam, and glucose blood.  Meds ordered this encounter  Medications  . Lancets Misc. (ACCU-CHEK FASTCLIX LANCET) KIT    Sig: 1 Units by Does not apply route daily before breakfast.    Dispense:  1 kit    Refill:  11    Order Specific Question:   Supervising Provider    Answer:   Lucille Passy [[id number]]  . ferrous sulfate 325 (65 FE) MG tablet    Sig: Take 1 tablet (325 mg total) by mouth daily with breakfast.    Dispense:  90 tablet    Refill:  3    Order Specific Question:   Supervising Provider    Answer:   Lucille Passy [[id number]]  . finasteride (PROSCAR) 5 MG tablet    Sig: Take 1 tablet (5 mg total) by mouth daily.    Dispense:  90 tablet    Refill:  3    Order Specific Question:   Supervising Provider    Answer:   Lucille Passy [[id number]]  . metFORMIN (GLUCOPHAGE) 1000 MG tablet    Sig: Take 1 tablet (1,000 mg total) by mouth 2 (two) times daily  with a meal.    Dispense:  180 tablet     Refill:  3    Order Specific Question:   Supervising Provider    Answer:   Lucille Passy [[id number]]  . Olmesartan-amLODIPine-HCTZ 40-10-12.5 MG TABS    Sig: Take 1 tablet by mouth daily.    Dispense:  90 tablet    Refill:  3    Order Specific Question:   Supervising Provider    Answer:   Lucille Passy [[id number]]  . cloNIDine (CATAPRES) 0.1 MG tablet    Sig: Take 1 tablet (0.1 mg total) by mouth 2 (two) times daily.    Dispense:  180 tablet    Refill:  3    Order Specific Question:   Supervising Provider    Answer:   Lucille Passy [[id number]]  . omeprazole (PRILOSEC) 20 MG capsule    Sig: Take 1 capsule (20 mg total) by mouth daily.    Dispense:  90 capsule    Refill:  3    Order Specific Question:   Supervising Provider    Answer:   Lucille Passy [[id number]]  . pioglitazone (ACTOS) 30 MG tablet    Sig: Take 1 tablet (30 mg total) by mouth daily.    Dispense:  90 tablet    Refill:  3    Order Specific Question:   Supervising Provider    Answer:   Lucille Passy [[id number]]  . pregabalin (LYRICA) 75 MG capsule    Sig: Take 1 capsule (75 mg total) by mouth 2 (two) times daily.    Dispense:  180 capsule    Refill:  1    Order Specific Question:   Supervising Provider    Answer:   Lucille Passy [[id number]]    Follow-up: Return in about 6 months (around 08/10/2018) for DM and HTN, hyperlipidemia.  Wilfred Lacy, NP

## 2018-02-09 NOTE — Patient Instructions (Addendum)
Stable BMP and DM, with hgbA1c of 6.3. CBC indicates stable anemia, with improved iron panel. Start ferrous sulfate once a day, with food. He is overdue for colonoscopy. Is he ok with referral to GI?  You will be contacted about home health services. Also contact your insurance about long term home health for ADLs. You may also contact local agencies about cost and services.  Maintain balance diet and adequate oral hydration. Resume chair exercises.  Make appt for eye exam. fax ophthalmology report to [place].  Deconditioning Deconditioning refers to the changes in your body that occur during a period of inactivity. The changes happen in your heart, lungs, and muscles. They decrease your ability to be active, and they make you feel tired and weak. There are three stages of deconditioning:  Mild deconditioning. At this stage, you will notice a change in your ability to do your usual exercise activities, such as running, biking, or swimming.  Moderate deconditioning. At this stage, you will notice a change in your ability to do normal everyday activities, such as walking, grocery shopping, and doing chores.  Severe deconditioning. At this stage, you will notice a change in your ability to do minimal activity or normal self-care.  Deconditioning can occur after only a few days of inactivity. The longer the period of inactivity, the more severe the deconditioning will be, and the longer it will take to return to your previous level of functioning. What are the causes? Deconditioning is often caused by inactivity due to:  Illnesses, such as cancer, stroke, heart attack, fibromyalgia, and chronic fatigue syndrome.  Injuries, especially back injuries, broken bones, and ligament and tendon injuries.  A long stay in the hospital.  Pregnancy, especially if long periods of bed rest are needed.  What increases the risk? This condition is more likely to develop in:  People who are  hospitalized.  People on bed rest.  People who are obese.  People with poor nutrition.  Elderly adults.  People with injuries or illnesses that interfere with movement and activity.  What are the signs or symptoms? Symptoms of deconditioning include:  Weakness.  Tiredness.  Shortness of breath with minor exertion.  A faster-than-normal heartbeat. You may not notice this without taking your pulse.  Pain or discomfort with activity.  Decreased strength.  Decreased sense of balance.  Decreased endurance.  Difficulty doing your usual forms of exercise.  Difficulty doing activities of daily living, such as grocery shopping or chores.  Difficulty walking around the house and doing basic self-care, such as getting to the bathroom, preparing meals, or doing laundry.  How is this diagnosed? Deconditioning is diagnosed based on your medical history and a physical exam. During the physical exam, your health care provider will check for signs of deconditioning, such as:  Decreased size of muscles.  Decreased strength.  Trouble with balance.  Shortness of breath or abnormally increased heart rate after minor exertion.  How is this treated? Treatment for deconditioning usually involves following a structured exercise program in which activity is increased gradually. Your health care provider will determine which exercises are right for you. The exercise program will likely include aerobic exercise and strength training:  Aerobic exercise helps improve the functioning of the heart and lungs as well as the muscles.  Strength training helps improve muscle size and strength.  Both of these types of exercise will improve your endurance. You may be referred to a physical therapist who can create a safe strengthening program for you  to follow. Follow these instructions at home:  Follow the exercise program that is recommended by your health care provider or physical  therapist.  Do not increase your exercise any faster than directed.  Eat a healthy diet.  Do not use any products that contain nicotine or tobacco, such as cigarettes and e-cigarettes. If you need help quitting, ask your health care provider.  Take over-the-counter and prescription medicines only as told by your health care provider.  Keep all follow-up visits as told by your health care provider. This is important. Contact a health care provider if:  You are not able to carry out the prescribed exercise program.  You are becoming more and more fatigued and weak.  You become light-headed when rising to a sitting or standing position.  Your level of endurance decreases after it has improved. Get help right away if:  You have chest pain.  You are very short of breath.  You have any episodes of passing out. This information is not intended to replace advice given to you by your health care provider. Make sure you discuss any questions you have with your health care provider. Document Released: 10/02/2013 Document Revised: 12/06/2015 Document Reviewed: 08/17/2015 Elsevier Interactive Patient Education  2018 [employer].   Diabetes Mellitus and Nutrition When you have diabetes (diabetes mellitus), it is very important to have healthy eating habits because your blood sugar (glucose) levels are greatly affected by what you eat and drink. Eating healthy foods in the appropriate amounts, at about the same times every day, can help you:  Control your blood glucose.  Lower your risk of heart disease.  Improve your blood pressure.  Reach or maintain a healthy weight.  Every person with diabetes is different, and each person has different needs for a meal plan. Your health care provider may recommend that you work with a diet and nutrition specialist (dietitian) to make a meal plan that is best for you. Your meal plan may vary depending on factors such as:  The calories you  need.  The medicines you take.  Your weight.  Your blood glucose, blood pressure, and cholesterol levels.  Your activity level.  Other health conditions you have, such as heart or kidney disease.  How do carbohydrates affect me? Carbohydrates affect your blood glucose level more than any other type of food. Eating carbohydrates naturally increases the amount of glucose in your blood. Carbohydrate counting is a method for keeping track of how many carbohydrates you eat. Counting carbohydrates is important to keep your blood glucose at a healthy level, especially if you use insulin or take certain oral diabetes medicines. It is important to know how many carbohydrates you can safely have in each meal. This is different for every person. Your dietitian can help you calculate how many carbohydrates you should have at each meal and for snack. Foods that contain carbohydrates include:  Bread, cereal, rice, pasta, and crackers.  Potatoes and corn.  Peas, beans, and lentils.  Milk and yogurt.  Fruit and juice.  Desserts, such as cakes, cookies, ice cream, and candy.  How does alcohol affect me? Alcohol can cause a sudden decrease in blood glucose (hypoglycemia), especially if you use insulin or take certain oral diabetes medicines. Hypoglycemia can be a life-threatening condition. Symptoms of hypoglycemia (sleepiness, dizziness, and confusion) are similar to symptoms of having too much alcohol. If your health care provider says that alcohol is safe for you, follow these guidelines:  Limit alcohol intake to no  more than 1 drink per day for nonpregnant women and 2 drinks per day for men. One drink equals 12 oz of beer, 5 oz of wine, or 1 oz of hard liquor.  Do not drink on an empty stomach.  Keep yourself hydrated with water, diet soda, or unsweetened iced tea.  Keep in mind that regular soda, juice, and other mixers may contain a lot of sugar and must be counted as  carbohydrates.  What are tips for following this plan? Reading food labels  Start by checking the serving size on the label. The amount of calories, carbohydrates, fats, and other nutrients listed on the label are based on one serving of the food. Many foods contain more than one serving per package.  Check the total grams (g) of carbohydrates in one serving. You can calculate the number of servings of carbohydrates in one serving by dividing the total carbohydrates by 15. For example, if a food has 30 g of total carbohydrates, it would be equal to 2 servings of carbohydrates.  Check the number of grams (g) of saturated and trans fats in one serving. Choose foods that have low or no amount of these fats.  Check the number of milligrams (mg) of sodium in one serving. Most people should limit total sodium intake to less than 2,300 mg per day.  Always check the nutrition information of foods labeled as "low-fat" or "nonfat". These foods may be higher in added sugar or refined carbohydrates and should be avoided.  Talk to your dietitian to identify your daily goals for nutrients listed on the label. Shopping  Avoid buying canned, premade, or processed foods. These foods tend to be high in fat, sodium, and added sugar.  Shop around the outside edge of the grocery store. This includes fresh fruits and vegetables, bulk grains, fresh meats, and fresh dairy. Cooking  Use low-heat cooking methods, such as baking, instead of high-heat cooking methods like deep frying.  Cook using healthy oils, such as olive, canola, or sunflower oil.  Avoid cooking with butter, cream, or high-fat meats. Meal planning  Eat meals and snacks regularly, preferably at the same times every day. Avoid going long periods of time without eating.  Eat foods high in fiber, such as fresh fruits, vegetables, beans, and whole grains. Talk to your dietitian about how many servings of carbohydrates you can eat at each  meal.  Eat 4-6 ounces of lean protein each day, such as lean meat, chicken, fish, eggs, or tofu. 1 ounce is equal to 1 ounce of meat, chicken, or fish, 1 egg, or 1/4 cup of tofu.  Eat some foods each day that contain healthy fats, such as avocado, nuts, seeds, and fish. Lifestyle   Check your blood glucose regularly.  Exercise at least 30 minutes 5 or more days each week, or as told by your health care provider.  Take medicines as told by your health care provider.  Do not use any products that contain nicotine or tobacco, such as cigarettes and e-cigarettes. If you need help quitting, ask your health care provider.  Work with a Social worker or diabetes educator to identify strategies to manage stress and any emotional and social challenges. What are some questions to ask my health care provider?  Do I need to meet with a diabetes educator?  Do I need to meet with a dietitian?  What number can I call if I have questions?  When are the best times to check my blood glucose? Where  to find more information:  American Diabetes Association: diabetes.org/food-and-fitness/food  Academy of Nutrition and Dietetics: PokerClues.dk  [employer] of Diabetes and Digestive and Kidney Diseases (NIH): ContactWire.be Summary  A healthy meal plan will help you control your blood glucose and maintain a healthy lifestyle.  Working with a diet and nutrition specialist (dietitian) can help you make a meal plan that is best for you.  Keep in mind that carbohydrates and alcohol have immediate effects on your blood glucose levels. It is important to count carbohydrates and to use alcohol carefully. This information is not intended to replace advice given to you by your health care provider. Make sure you discuss any questions you have with your health care provider. Document  Released: 02/12/2005 Document Revised: 06/22/2016 Document Reviewed: 06/22/2016 Elsevier Interactive Patient Education  [employer].

## 2018-02-14 ENCOUNTER — Other Ambulatory Visit: Payer: Self-pay | Admitting: Neurology

## 2018-02-16 DIAGNOSIS — I152 Hypertension secondary to endocrine disorders: Secondary | ICD-10-CM | POA: Diagnosis not present

## 2018-02-16 DIAGNOSIS — M4802 Spinal stenosis, cervical region: Secondary | ICD-10-CM | POA: Diagnosis not present

## 2018-02-16 DIAGNOSIS — R350 Frequency of micturition: Secondary | ICD-10-CM | POA: Diagnosis not present

## 2018-02-16 DIAGNOSIS — G8220 Paraplegia, unspecified: Secondary | ICD-10-CM | POA: Diagnosis not present

## 2018-02-16 DIAGNOSIS — Z7984 Long term (current) use of oral hypoglycemic drugs: Secondary | ICD-10-CM | POA: Diagnosis not present

## 2018-02-16 DIAGNOSIS — M4316 Spondylolisthesis, lumbar region: Secondary | ICD-10-CM | POA: Diagnosis not present

## 2018-02-16 DIAGNOSIS — D509 Iron deficiency anemia, unspecified: Secondary | ICD-10-CM | POA: Diagnosis not present

## 2018-02-16 DIAGNOSIS — Z79891 Long term (current) use of opiate analgesic: Secondary | ICD-10-CM | POA: Diagnosis not present

## 2018-02-16 DIAGNOSIS — G35 Multiple sclerosis: Secondary | ICD-10-CM | POA: Diagnosis not present

## 2018-02-16 DIAGNOSIS — K219 Gastro-esophageal reflux disease without esophagitis: Secondary | ICD-10-CM | POA: Diagnosis not present

## 2018-02-16 DIAGNOSIS — E1142 Type 2 diabetes mellitus with diabetic polyneuropathy: Secondary | ICD-10-CM | POA: Diagnosis not present

## 2018-02-16 DIAGNOSIS — E1169 Type 2 diabetes mellitus with other specified complication: Secondary | ICD-10-CM | POA: Diagnosis not present

## 2018-02-16 DIAGNOSIS — M48061 Spinal stenosis, lumbar region without neurogenic claudication: Secondary | ICD-10-CM | POA: Diagnosis not present

## 2018-02-16 DIAGNOSIS — M5416 Radiculopathy, lumbar region: Secondary | ICD-10-CM | POA: Diagnosis not present

## 2018-02-17 ENCOUNTER — Telehealth: Payer: Self-pay | Admitting: Nurse Practitioner

## 2018-02-17 DIAGNOSIS — Z7984 Long term (current) use of oral hypoglycemic drugs: Secondary | ICD-10-CM | POA: Diagnosis not present

## 2018-02-17 DIAGNOSIS — K219 Gastro-esophageal reflux disease without esophagitis: Secondary | ICD-10-CM | POA: Diagnosis not present

## 2018-02-17 DIAGNOSIS — D509 Iron deficiency anemia, unspecified: Secondary | ICD-10-CM | POA: Diagnosis not present

## 2018-02-17 DIAGNOSIS — Z79891 Long term (current) use of opiate analgesic: Secondary | ICD-10-CM | POA: Diagnosis not present

## 2018-02-17 DIAGNOSIS — M4316 Spondylolisthesis, lumbar region: Secondary | ICD-10-CM | POA: Diagnosis not present

## 2018-02-17 DIAGNOSIS — G35 Multiple sclerosis: Secondary | ICD-10-CM | POA: Diagnosis not present

## 2018-02-17 DIAGNOSIS — M4802 Spinal stenosis, cervical region: Secondary | ICD-10-CM | POA: Diagnosis not present

## 2018-02-17 DIAGNOSIS — I152 Hypertension secondary to endocrine disorders: Secondary | ICD-10-CM | POA: Diagnosis not present

## 2018-02-17 DIAGNOSIS — R350 Frequency of micturition: Secondary | ICD-10-CM | POA: Diagnosis not present

## 2018-02-17 DIAGNOSIS — E1169 Type 2 diabetes mellitus with other specified complication: Secondary | ICD-10-CM | POA: Diagnosis not present

## 2018-02-17 DIAGNOSIS — E1142 Type 2 diabetes mellitus with diabetic polyneuropathy: Secondary | ICD-10-CM | POA: Diagnosis not present

## 2018-02-17 DIAGNOSIS — G8220 Paraplegia, unspecified: Secondary | ICD-10-CM | POA: Diagnosis not present

## 2018-02-17 DIAGNOSIS — M5416 Radiculopathy, lumbar region: Secondary | ICD-10-CM | POA: Diagnosis not present

## 2018-02-17 DIAGNOSIS — M48061 Spinal stenosis, lumbar region without neurogenic claudication: Secondary | ICD-10-CM | POA: Diagnosis not present

## 2018-02-17 NOTE — Telephone Encounter (Signed)
ok 

## 2018-02-17 NOTE — Telephone Encounter (Signed)
Copied from [place] [id number]. Topic: Quick Communication - See Telephone Encounter >> Feb 17, 2018  3:13 PM Adelene Idler wrote: Claiborne Billings from [facility] is calling for verbal orders for [place] for twice a week for 4 weeks. May leave detailed voicemail   Cb# [phone]

## 2018-02-18 DIAGNOSIS — M48061 Spinal stenosis, lumbar region without neurogenic claudication: Secondary | ICD-10-CM | POA: Diagnosis not present

## 2018-02-18 DIAGNOSIS — E1169 Type 2 diabetes mellitus with other specified complication: Secondary | ICD-10-CM | POA: Diagnosis not present

## 2018-02-18 DIAGNOSIS — M5416 Radiculopathy, lumbar region: Secondary | ICD-10-CM | POA: Diagnosis not present

## 2018-02-18 DIAGNOSIS — M4802 Spinal stenosis, cervical region: Secondary | ICD-10-CM | POA: Diagnosis not present

## 2018-02-18 DIAGNOSIS — R350 Frequency of micturition: Secondary | ICD-10-CM | POA: Diagnosis not present

## 2018-02-18 DIAGNOSIS — K219 Gastro-esophageal reflux disease without esophagitis: Secondary | ICD-10-CM | POA: Diagnosis not present

## 2018-02-18 DIAGNOSIS — M4316 Spondylolisthesis, lumbar region: Secondary | ICD-10-CM | POA: Diagnosis not present

## 2018-02-18 DIAGNOSIS — Z7984 Long term (current) use of oral hypoglycemic drugs: Secondary | ICD-10-CM | POA: Diagnosis not present

## 2018-02-18 DIAGNOSIS — E1142 Type 2 diabetes mellitus with diabetic polyneuropathy: Secondary | ICD-10-CM | POA: Diagnosis not present

## 2018-02-18 DIAGNOSIS — G8220 Paraplegia, unspecified: Secondary | ICD-10-CM | POA: Diagnosis not present

## 2018-02-18 DIAGNOSIS — D509 Iron deficiency anemia, unspecified: Secondary | ICD-10-CM | POA: Diagnosis not present

## 2018-02-18 DIAGNOSIS — I152 Hypertension secondary to endocrine disorders: Secondary | ICD-10-CM | POA: Diagnosis not present

## 2018-02-18 DIAGNOSIS — G35 Multiple sclerosis: Secondary | ICD-10-CM | POA: Diagnosis not present

## 2018-02-18 DIAGNOSIS — Z79891 Long term (current) use of opiate analgesic: Secondary | ICD-10-CM | POA: Diagnosis not present

## 2018-02-18 NOTE — Telephone Encounter (Signed)
Left detail message inform Larry [name].  

## 2018-02-22 DIAGNOSIS — M4802 Spinal stenosis, cervical region: Secondary | ICD-10-CM | POA: Diagnosis not present

## 2018-02-22 DIAGNOSIS — E1142 Type 2 diabetes mellitus with diabetic polyneuropathy: Secondary | ICD-10-CM | POA: Diagnosis not present

## 2018-02-22 DIAGNOSIS — E1169 Type 2 diabetes mellitus with other specified complication: Secondary | ICD-10-CM | POA: Diagnosis not present

## 2018-02-22 DIAGNOSIS — D509 Iron deficiency anemia, unspecified: Secondary | ICD-10-CM | POA: Diagnosis not present

## 2018-02-22 DIAGNOSIS — Z79891 Long term (current) use of opiate analgesic: Secondary | ICD-10-CM | POA: Diagnosis not present

## 2018-02-22 DIAGNOSIS — K219 Gastro-esophageal reflux disease without esophagitis: Secondary | ICD-10-CM | POA: Diagnosis not present

## 2018-02-22 DIAGNOSIS — M4316 Spondylolisthesis, lumbar region: Secondary | ICD-10-CM | POA: Diagnosis not present

## 2018-02-22 DIAGNOSIS — M5416 Radiculopathy, lumbar region: Secondary | ICD-10-CM | POA: Diagnosis not present

## 2018-02-22 DIAGNOSIS — R350 Frequency of micturition: Secondary | ICD-10-CM | POA: Diagnosis not present

## 2018-02-22 DIAGNOSIS — I152 Hypertension secondary to endocrine disorders: Secondary | ICD-10-CM | POA: Diagnosis not present

## 2018-02-22 DIAGNOSIS — G8220 Paraplegia, unspecified: Secondary | ICD-10-CM | POA: Diagnosis not present

## 2018-02-22 DIAGNOSIS — M48061 Spinal stenosis, lumbar region without neurogenic claudication: Secondary | ICD-10-CM | POA: Diagnosis not present

## 2018-02-22 DIAGNOSIS — G35 Multiple sclerosis: Secondary | ICD-10-CM | POA: Diagnosis not present

## 2018-02-22 DIAGNOSIS — Z7984 Long term (current) use of oral hypoglycemic drugs: Secondary | ICD-10-CM | POA: Diagnosis not present

## 2018-02-24 DIAGNOSIS — M4802 Spinal stenosis, cervical region: Secondary | ICD-10-CM | POA: Diagnosis not present

## 2018-02-24 DIAGNOSIS — Z7984 Long term (current) use of oral hypoglycemic drugs: Secondary | ICD-10-CM | POA: Diagnosis not present

## 2018-02-24 DIAGNOSIS — K219 Gastro-esophageal reflux disease without esophagitis: Secondary | ICD-10-CM | POA: Diagnosis not present

## 2018-02-24 DIAGNOSIS — I152 Hypertension secondary to endocrine disorders: Secondary | ICD-10-CM | POA: Diagnosis not present

## 2018-02-24 DIAGNOSIS — G8220 Paraplegia, unspecified: Secondary | ICD-10-CM | POA: Diagnosis not present

## 2018-02-24 DIAGNOSIS — G35 Multiple sclerosis: Secondary | ICD-10-CM | POA: Diagnosis not present

## 2018-02-24 DIAGNOSIS — M4316 Spondylolisthesis, lumbar region: Secondary | ICD-10-CM | POA: Diagnosis not present

## 2018-02-24 DIAGNOSIS — Z79891 Long term (current) use of opiate analgesic: Secondary | ICD-10-CM | POA: Diagnosis not present

## 2018-02-24 DIAGNOSIS — D509 Iron deficiency anemia, unspecified: Secondary | ICD-10-CM | POA: Diagnosis not present

## 2018-02-24 DIAGNOSIS — M48061 Spinal stenosis, lumbar region without neurogenic claudication: Secondary | ICD-10-CM | POA: Diagnosis not present

## 2018-02-24 DIAGNOSIS — E1169 Type 2 diabetes mellitus with other specified complication: Secondary | ICD-10-CM | POA: Diagnosis not present

## 2018-02-24 DIAGNOSIS — E1142 Type 2 diabetes mellitus with diabetic polyneuropathy: Secondary | ICD-10-CM | POA: Diagnosis not present

## 2018-02-24 DIAGNOSIS — R350 Frequency of micturition: Secondary | ICD-10-CM | POA: Diagnosis not present

## 2018-02-24 DIAGNOSIS — M5416 Radiculopathy, lumbar region: Secondary | ICD-10-CM | POA: Diagnosis not present

## 2018-02-28 ENCOUNTER — Other Ambulatory Visit: Payer: Self-pay | Admitting: Nurse Practitioner

## 2018-02-28 ENCOUNTER — Other Ambulatory Visit: Payer: Self-pay | Admitting: Neurology

## 2018-02-28 DIAGNOSIS — N401 Enlarged prostate with lower urinary tract symptoms: Secondary | ICD-10-CM

## 2018-03-01 ENCOUNTER — Telehealth: Payer: Self-pay | Admitting: Nurse Practitioner

## 2018-03-01 DIAGNOSIS — K219 Gastro-esophageal reflux disease without esophagitis: Secondary | ICD-10-CM | POA: Diagnosis not present

## 2018-03-01 DIAGNOSIS — D509 Iron deficiency anemia, unspecified: Secondary | ICD-10-CM | POA: Diagnosis not present

## 2018-03-01 DIAGNOSIS — Z7984 Long term (current) use of oral hypoglycemic drugs: Secondary | ICD-10-CM | POA: Diagnosis not present

## 2018-03-01 DIAGNOSIS — E1142 Type 2 diabetes mellitus with diabetic polyneuropathy: Secondary | ICD-10-CM | POA: Diagnosis not present

## 2018-03-01 DIAGNOSIS — E1169 Type 2 diabetes mellitus with other specified complication: Secondary | ICD-10-CM | POA: Diagnosis not present

## 2018-03-01 DIAGNOSIS — G8220 Paraplegia, unspecified: Secondary | ICD-10-CM | POA: Diagnosis not present

## 2018-03-01 DIAGNOSIS — M48061 Spinal stenosis, lumbar region without neurogenic claudication: Secondary | ICD-10-CM | POA: Diagnosis not present

## 2018-03-01 DIAGNOSIS — M5416 Radiculopathy, lumbar region: Secondary | ICD-10-CM | POA: Diagnosis not present

## 2018-03-01 DIAGNOSIS — R350 Frequency of micturition: Secondary | ICD-10-CM | POA: Diagnosis not present

## 2018-03-01 DIAGNOSIS — M4802 Spinal stenosis, cervical region: Secondary | ICD-10-CM | POA: Diagnosis not present

## 2018-03-01 DIAGNOSIS — I152 Hypertension secondary to endocrine disorders: Secondary | ICD-10-CM | POA: Diagnosis not present

## 2018-03-01 DIAGNOSIS — M4316 Spondylolisthesis, lumbar region: Secondary | ICD-10-CM | POA: Diagnosis not present

## 2018-03-01 DIAGNOSIS — Z79891 Long term (current) use of opiate analgesic: Secondary | ICD-10-CM | POA: Diagnosis not present

## 2018-03-01 DIAGNOSIS — G35 Multiple sclerosis: Secondary | ICD-10-CM | POA: Diagnosis not present

## 2018-03-01 NOTE — Telephone Encounter (Signed)
Copied from [place] [id number]. Topic: Quick Communication - See Telephone Encounter >> Mar 01, 2018  3:44 PM Sheran Luz wrote: CRM for notification. See Telephone encounter for: 03/01/18.   Beth PT assistant with [facility] calling to inform Dr. Lorayne Marek that pts BP was elevated today during their visit. Beth states nursing is in but would like to know if Dr. Lorayne Marek recommends monitoring or educating the pt. Beth states pts BP was 172/88 with no symptoms this afternoon. Please advise.

## 2018-03-01 NOTE — Telephone Encounter (Signed)
Spoke with the, he will report back to BP reading as directed tomorrow.   Larry [name] is aware of this advise as well.

## 2018-03-01 NOTE — Telephone Encounter (Signed)
Please repeat BP this evening and tomorrow morning and call office

## 2018-03-02 NOTE — Telephone Encounter (Signed)
Patient calling to give BP readings:  03/01/2018-3pm > 143/73 03/02/2018-3pm> 119/55

## 2018-03-03 DIAGNOSIS — E1142 Type 2 diabetes mellitus with diabetic polyneuropathy: Secondary | ICD-10-CM | POA: Diagnosis not present

## 2018-03-03 DIAGNOSIS — K219 Gastro-esophageal reflux disease without esophagitis: Secondary | ICD-10-CM | POA: Diagnosis not present

## 2018-03-03 DIAGNOSIS — M4316 Spondylolisthesis, lumbar region: Secondary | ICD-10-CM | POA: Diagnosis not present

## 2018-03-03 DIAGNOSIS — M48061 Spinal stenosis, lumbar region without neurogenic claudication: Secondary | ICD-10-CM | POA: Diagnosis not present

## 2018-03-03 DIAGNOSIS — R350 Frequency of micturition: Secondary | ICD-10-CM | POA: Diagnosis not present

## 2018-03-03 DIAGNOSIS — G8220 Paraplegia, unspecified: Secondary | ICD-10-CM | POA: Diagnosis not present

## 2018-03-03 DIAGNOSIS — G35 Multiple sclerosis: Secondary | ICD-10-CM | POA: Diagnosis not present

## 2018-03-03 DIAGNOSIS — M4802 Spinal stenosis, cervical region: Secondary | ICD-10-CM | POA: Diagnosis not present

## 2018-03-03 DIAGNOSIS — I152 Hypertension secondary to endocrine disorders: Secondary | ICD-10-CM | POA: Diagnosis not present

## 2018-03-03 DIAGNOSIS — Z7984 Long term (current) use of oral hypoglycemic drugs: Secondary | ICD-10-CM | POA: Diagnosis not present

## 2018-03-03 DIAGNOSIS — D509 Iron deficiency anemia, unspecified: Secondary | ICD-10-CM | POA: Diagnosis not present

## 2018-03-03 DIAGNOSIS — Z79891 Long term (current) use of opiate analgesic: Secondary | ICD-10-CM | POA: Diagnosis not present

## 2018-03-03 DIAGNOSIS — E1169 Type 2 diabetes mellitus with other specified complication: Secondary | ICD-10-CM | POA: Diagnosis not present

## 2018-03-03 DIAGNOSIS — M5416 Radiculopathy, lumbar region: Secondary | ICD-10-CM | POA: Diagnosis not present

## 2018-03-03 NOTE — Telephone Encounter (Signed)
Pt is aware.  

## 2018-03-03 NOTE — Telephone Encounter (Signed)
Acceptable BP readings No medication change at this time

## 2018-03-08 ENCOUNTER — Telehealth: Payer: Self-pay | Admitting: Nurse Practitioner

## 2018-03-08 DIAGNOSIS — K219 Gastro-esophageal reflux disease without esophagitis: Secondary | ICD-10-CM | POA: Diagnosis not present

## 2018-03-08 DIAGNOSIS — E1169 Type 2 diabetes mellitus with other specified complication: Secondary | ICD-10-CM | POA: Diagnosis not present

## 2018-03-08 DIAGNOSIS — M4802 Spinal stenosis, cervical region: Secondary | ICD-10-CM | POA: Diagnosis not present

## 2018-03-08 DIAGNOSIS — M48061 Spinal stenosis, lumbar region without neurogenic claudication: Secondary | ICD-10-CM | POA: Diagnosis not present

## 2018-03-08 DIAGNOSIS — M5416 Radiculopathy, lumbar region: Secondary | ICD-10-CM | POA: Diagnosis not present

## 2018-03-08 DIAGNOSIS — M4316 Spondylolisthesis, lumbar region: Secondary | ICD-10-CM | POA: Diagnosis not present

## 2018-03-08 DIAGNOSIS — D509 Iron deficiency anemia, unspecified: Secondary | ICD-10-CM | POA: Diagnosis not present

## 2018-03-08 DIAGNOSIS — Z79891 Long term (current) use of opiate analgesic: Secondary | ICD-10-CM | POA: Diagnosis not present

## 2018-03-08 DIAGNOSIS — I152 Hypertension secondary to endocrine disorders: Secondary | ICD-10-CM | POA: Diagnosis not present

## 2018-03-08 DIAGNOSIS — G35 Multiple sclerosis: Secondary | ICD-10-CM | POA: Diagnosis not present

## 2018-03-08 DIAGNOSIS — R350 Frequency of micturition: Secondary | ICD-10-CM | POA: Diagnosis not present

## 2018-03-08 DIAGNOSIS — Z7984 Long term (current) use of oral hypoglycemic drugs: Secondary | ICD-10-CM | POA: Diagnosis not present

## 2018-03-08 DIAGNOSIS — G8220 Paraplegia, unspecified: Secondary | ICD-10-CM | POA: Diagnosis not present

## 2018-03-08 DIAGNOSIS — E1142 Type 2 diabetes mellitus with diabetic polyneuropathy: Secondary | ICD-10-CM | POA: Diagnosis not present

## 2018-03-08 NOTE — Telephone Encounter (Signed)
Left detail message inform Beth. Advise to call back with any questions.

## 2018-03-08 NOTE — Telephone Encounter (Signed)
Ok to order 

## 2018-03-08 NOTE — Telephone Encounter (Signed)
Copied from [place] [phone]. Topic: General - Other >> Mar 08, 2018 12:14 PM Leward Quan A wrote: Reason for CRM:  Bet with [facility] called to request a verbal order for a Medical Social Worker to get help with light housekeeping among other duties.

## 2018-03-10 DIAGNOSIS — D509 Iron deficiency anemia, unspecified: Secondary | ICD-10-CM | POA: Diagnosis not present

## 2018-03-10 DIAGNOSIS — R350 Frequency of micturition: Secondary | ICD-10-CM | POA: Diagnosis not present

## 2018-03-10 DIAGNOSIS — G8220 Paraplegia, unspecified: Secondary | ICD-10-CM | POA: Diagnosis not present

## 2018-03-10 DIAGNOSIS — M4802 Spinal stenosis, cervical region: Secondary | ICD-10-CM | POA: Diagnosis not present

## 2018-03-10 DIAGNOSIS — I152 Hypertension secondary to endocrine disorders: Secondary | ICD-10-CM | POA: Diagnosis not present

## 2018-03-10 DIAGNOSIS — G35 Multiple sclerosis: Secondary | ICD-10-CM | POA: Diagnosis not present

## 2018-03-10 DIAGNOSIS — M4316 Spondylolisthesis, lumbar region: Secondary | ICD-10-CM | POA: Diagnosis not present

## 2018-03-10 DIAGNOSIS — Z79891 Long term (current) use of opiate analgesic: Secondary | ICD-10-CM | POA: Diagnosis not present

## 2018-03-10 DIAGNOSIS — M48061 Spinal stenosis, lumbar region without neurogenic claudication: Secondary | ICD-10-CM | POA: Diagnosis not present

## 2018-03-10 DIAGNOSIS — E1142 Type 2 diabetes mellitus with diabetic polyneuropathy: Secondary | ICD-10-CM | POA: Diagnosis not present

## 2018-03-10 DIAGNOSIS — E1169 Type 2 diabetes mellitus with other specified complication: Secondary | ICD-10-CM | POA: Diagnosis not present

## 2018-03-10 DIAGNOSIS — Z7984 Long term (current) use of oral hypoglycemic drugs: Secondary | ICD-10-CM | POA: Diagnosis not present

## 2018-03-10 DIAGNOSIS — M5416 Radiculopathy, lumbar region: Secondary | ICD-10-CM | POA: Diagnosis not present

## 2018-03-10 DIAGNOSIS — K219 Gastro-esophageal reflux disease without esophagitis: Secondary | ICD-10-CM | POA: Diagnosis not present

## 2018-03-11 ENCOUNTER — Telehealth: Payer: Self-pay | Admitting: Nurse Practitioner

## 2018-03-11 NOTE — Telephone Encounter (Signed)
Pt stated he still has the one we gave him from last ov. He will bring it to them.

## 2018-03-11 NOTE — Telephone Encounter (Signed)
Copied from [place] [id number]. Topic: Quick Communication - See Telephone Encounter >> Mar 11, 2018 10:33 AM Bea Graff, NT wrote: CRM for notification. See Telephone encounter for: 03/11/18. Pt states he needs a rx for a wheelchair to be faxed to [place]. Fax#: [phone]

## 2018-03-14 DIAGNOSIS — R350 Frequency of micturition: Secondary | ICD-10-CM | POA: Diagnosis not present

## 2018-03-14 DIAGNOSIS — Z7984 Long term (current) use of oral hypoglycemic drugs: Secondary | ICD-10-CM | POA: Diagnosis not present

## 2018-03-14 DIAGNOSIS — G8220 Paraplegia, unspecified: Secondary | ICD-10-CM | POA: Diagnosis not present

## 2018-03-14 DIAGNOSIS — M4802 Spinal stenosis, cervical region: Secondary | ICD-10-CM | POA: Diagnosis not present

## 2018-03-14 DIAGNOSIS — M4316 Spondylolisthesis, lumbar region: Secondary | ICD-10-CM | POA: Diagnosis not present

## 2018-03-14 DIAGNOSIS — E1142 Type 2 diabetes mellitus with diabetic polyneuropathy: Secondary | ICD-10-CM | POA: Diagnosis not present

## 2018-03-14 DIAGNOSIS — M5416 Radiculopathy, lumbar region: Secondary | ICD-10-CM | POA: Diagnosis not present

## 2018-03-14 DIAGNOSIS — Z79891 Long term (current) use of opiate analgesic: Secondary | ICD-10-CM | POA: Diagnosis not present

## 2018-03-14 DIAGNOSIS — M48061 Spinal stenosis, lumbar region without neurogenic claudication: Secondary | ICD-10-CM | POA: Diagnosis not present

## 2018-03-14 DIAGNOSIS — D509 Iron deficiency anemia, unspecified: Secondary | ICD-10-CM | POA: Diagnosis not present

## 2018-03-14 DIAGNOSIS — I152 Hypertension secondary to endocrine disorders: Secondary | ICD-10-CM | POA: Diagnosis not present

## 2018-03-14 DIAGNOSIS — K219 Gastro-esophageal reflux disease without esophagitis: Secondary | ICD-10-CM | POA: Diagnosis not present

## 2018-03-14 DIAGNOSIS — G35 Multiple sclerosis: Secondary | ICD-10-CM | POA: Diagnosis not present

## 2018-03-14 DIAGNOSIS — E1169 Type 2 diabetes mellitus with other specified complication: Secondary | ICD-10-CM | POA: Diagnosis not present

## 2018-03-15 DIAGNOSIS — K219 Gastro-esophageal reflux disease without esophagitis: Secondary | ICD-10-CM | POA: Diagnosis not present

## 2018-03-15 DIAGNOSIS — Z79891 Long term (current) use of opiate analgesic: Secondary | ICD-10-CM | POA: Diagnosis not present

## 2018-03-15 DIAGNOSIS — I152 Hypertension secondary to endocrine disorders: Secondary | ICD-10-CM | POA: Diagnosis not present

## 2018-03-15 DIAGNOSIS — R350 Frequency of micturition: Secondary | ICD-10-CM | POA: Diagnosis not present

## 2018-03-15 DIAGNOSIS — G35 Multiple sclerosis: Secondary | ICD-10-CM | POA: Diagnosis not present

## 2018-03-15 DIAGNOSIS — D509 Iron deficiency anemia, unspecified: Secondary | ICD-10-CM | POA: Diagnosis not present

## 2018-03-15 DIAGNOSIS — M5416 Radiculopathy, lumbar region: Secondary | ICD-10-CM | POA: Diagnosis not present

## 2018-03-15 DIAGNOSIS — M4802 Spinal stenosis, cervical region: Secondary | ICD-10-CM | POA: Diagnosis not present

## 2018-03-15 DIAGNOSIS — M48061 Spinal stenosis, lumbar region without neurogenic claudication: Secondary | ICD-10-CM | POA: Diagnosis not present

## 2018-03-15 DIAGNOSIS — E1142 Type 2 diabetes mellitus with diabetic polyneuropathy: Secondary | ICD-10-CM | POA: Diagnosis not present

## 2018-03-15 DIAGNOSIS — E1169 Type 2 diabetes mellitus with other specified complication: Secondary | ICD-10-CM | POA: Diagnosis not present

## 2018-03-15 DIAGNOSIS — G8220 Paraplegia, unspecified: Secondary | ICD-10-CM | POA: Diagnosis not present

## 2018-03-15 DIAGNOSIS — M4316 Spondylolisthesis, lumbar region: Secondary | ICD-10-CM | POA: Diagnosis not present

## 2018-03-15 DIAGNOSIS — Z7984 Long term (current) use of oral hypoglycemic drugs: Secondary | ICD-10-CM | POA: Diagnosis not present

## 2018-03-17 DIAGNOSIS — M48061 Spinal stenosis, lumbar region without neurogenic claudication: Secondary | ICD-10-CM | POA: Diagnosis not present

## 2018-03-17 DIAGNOSIS — G8220 Paraplegia, unspecified: Secondary | ICD-10-CM | POA: Diagnosis not present

## 2018-03-17 DIAGNOSIS — Z1211 Encounter for screening for malignant neoplasm of colon: Secondary | ICD-10-CM | POA: Diagnosis not present

## 2018-03-17 DIAGNOSIS — Z79891 Long term (current) use of opiate analgesic: Secondary | ICD-10-CM | POA: Diagnosis not present

## 2018-03-17 DIAGNOSIS — Z7984 Long term (current) use of oral hypoglycemic drugs: Secondary | ICD-10-CM | POA: Diagnosis not present

## 2018-03-17 DIAGNOSIS — E1169 Type 2 diabetes mellitus with other specified complication: Secondary | ICD-10-CM | POA: Diagnosis not present

## 2018-03-17 DIAGNOSIS — D509 Iron deficiency anemia, unspecified: Secondary | ICD-10-CM | POA: Diagnosis not present

## 2018-03-17 DIAGNOSIS — E1142 Type 2 diabetes mellitus with diabetic polyneuropathy: Secondary | ICD-10-CM | POA: Diagnosis not present

## 2018-03-17 DIAGNOSIS — R350 Frequency of micturition: Secondary | ICD-10-CM | POA: Diagnosis not present

## 2018-03-17 DIAGNOSIS — K219 Gastro-esophageal reflux disease without esophagitis: Secondary | ICD-10-CM | POA: Diagnosis not present

## 2018-03-17 DIAGNOSIS — I152 Hypertension secondary to endocrine disorders: Secondary | ICD-10-CM | POA: Diagnosis not present

## 2018-03-17 DIAGNOSIS — G35 Multiple sclerosis: Secondary | ICD-10-CM | POA: Diagnosis not present

## 2018-03-17 DIAGNOSIS — M5416 Radiculopathy, lumbar region: Secondary | ICD-10-CM | POA: Diagnosis not present

## 2018-03-17 DIAGNOSIS — M4316 Spondylolisthesis, lumbar region: Secondary | ICD-10-CM | POA: Diagnosis not present

## 2018-03-17 DIAGNOSIS — Z1212 Encounter for screening for malignant neoplasm of rectum: Secondary | ICD-10-CM | POA: Diagnosis not present

## 2018-03-17 DIAGNOSIS — M4802 Spinal stenosis, cervical region: Secondary | ICD-10-CM | POA: Diagnosis not present

## 2018-03-23 ENCOUNTER — Encounter: Payer: Self-pay | Admitting: Neurology

## 2018-03-23 ENCOUNTER — Encounter: Payer: Self-pay | Admitting: Nurse Practitioner

## 2018-03-23 ENCOUNTER — Ambulatory Visit: Payer: Medicare Other | Admitting: Neurology

## 2018-03-23 VITALS — BP 146/79 | HR 101 | Ht 67.0 in | Wt 160.5 lb

## 2018-03-23 DIAGNOSIS — G8220 Paraplegia, unspecified: Secondary | ICD-10-CM

## 2018-03-23 DIAGNOSIS — M48061 Spinal stenosis, lumbar region without neurogenic claudication: Secondary | ICD-10-CM

## 2018-03-23 DIAGNOSIS — M5416 Radiculopathy, lumbar region: Secondary | ICD-10-CM | POA: Diagnosis not present

## 2018-03-23 DIAGNOSIS — G5603 Carpal tunnel syndrome, bilateral upper limbs: Secondary | ICD-10-CM | POA: Diagnosis not present

## 2018-03-23 DIAGNOSIS — R6889 Other general symptoms and signs: Secondary | ICD-10-CM | POA: Diagnosis not present

## 2018-03-23 LAB — COLOGUARD
Cologuard: NEGATIVE
Cologuard: NEGATIVE

## 2018-03-23 NOTE — Progress Notes (Signed)
HISTORY OF PRESENT ILLNESS: Larry [name] is a 67 -year-old male, accompanied by her daughter Larry [name], seen in refer by his primary care physician Dr. Rodena Goldmann for evaluation of worsening gait difficulty, multiple sclerosis, initial evaluation was on June 12 2015.  He was a patient of our office in the past, last clinical visit was July 2013, he had a history of diabetes since 2002, also with  diagnosis of relapsing remitting multiple sclerosis, initial presentation was gait difficulty in 1996, he fell few times, he was initially diagnosed with lumbar spinal stenosis, had lumbar spine disc decompression surgery, which fail to improve his symptoms, he continued to have worsening gait difficulty, this eventually led to hospital admission at [facility], the diagnosis of MS was based on abnormal MRI findings, spinal fluid testing, after rehabilitation, he was able to ambulate again with a cane, there was no visual loss, he was started treatment with Avonex since 1996, he moved to [place] in 1999, was under the care of Dr. Delphia Grates at [facility], was treated with Betaseron for a short period of time, but he could not tolerate it due to injection side effect, went back on Avelox shortly afterwards  over the years, he denies significant relapsing episode, he had slow decline in his walking, has been ambulate with crutches, exercise regularly  He had a history of lumbar decompression surgery  in 1996 at [place] with very difficult recovery, he was in wheelchair for 6 months because of gait difficulty.  He presented to the hospital in July 2013 for worsening gait difficulty, had extensive imaging study.  I have personally reviewed MRI of the brain in 2013 showed chronic changes, cervical spine with significant cord compression at C4-5 C5-6, but no abnormal cord signal to suggest cervical MS lesions, MRI of thoracic spine showed chronic cord  compression and myelomalacia at T10-11, he was evaluated by neurosurgeon, Dr. Ellene Route, the plan was to treat him with short course of steroid, followed by thoracic elective decompression, he has made significant improvement with steroid treatment, has lost follow-up,  Over the past 3 years, he had worsening gait difficulty, he is able to walk with crutches, but with worsening bending forward posture and difficulty, recent few months, he also noticed bilateral hands paresthesia. he lives in his own apartment, has 12 steps,  he denies significant low back pain, no bowel and bladder incontinence, he exercise 3 times each week,  UPDATE April 6th 2017: He was evaluated by Dr. Ellene Route in early March 2017, per patient, he was offered decompression surgery for cervical and thoracic stenosis, he also had MRI of lumbar He is now complaining of worsening right low back pain, radiating pain to right hip and right thigh,   He also began to notice bilateral hands numbness weakness since 2003, gradually getting worse, is very difficult for him to button his shirt  I have personally reviewed MRI of lumbar in March 2017: Multilevel degenerative changes, most severe at L4-5 multifactorial severe spinal stenosis lateral recess stenosis. Marked foraminal narrowing greater on the right  UPDATE Mar 03 2016:  He had L4-5 decompressive laminectomy, decompression of  L4-5 nerve roots, posterior lumbar interbody arthrodeses with autograft and allograft by Dr. Ellene Route on December 30 2015, he responded very well, he no longer has significant right low back pain radiating to his right hip, his posture has improved, he can move better,  However since surgery, he noticed increasing incident of bowel incontinence, couple times each  week, he denies significant problem with his bladder  We again reviewed presurgical MRI in March 2017, multilevel lumbar degenerative disc disease, L4-5 severe spinal stenosis, lateral recess stenosis,  marked foraminal narrowing greater on the right, MRI of thoracic spine moderate to severe spinal stenosis at T10-11, severe bilateral foraminal stenosis, T2 hyper intensity cord signal, facet hypertrophy at T9-10, with severe bilateral foraminal stenosis, MRI of cervical spine, Mild spinal canal stenosis severe bilateral foraminal stenosis at C4-5, C5-6 and C6-7   UPDATE January 01 2017: He was evaluated by Jinny Blossom in April 2018, continue complains of slow worsening bowel incontinence, at least 1-2 times each week, 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 T8-9 mild spinal stenosis, mild right severe left foraminal stenosis, T11-12, disc bulging, facet hypertrophy with severe bilateral foraminal narrowing, T9-10, facet hypertrophy with severe by foraminal stenosis.  UPDATE Mar 09 2017: He has no low back pain, bu he complains of feet cold all the time. He has regular bowel movement. Still has occasionally bowel incontinence, urinary urgency, was seen by urologist in the past  UPDATE Mar 22 2018: He has more difficulty walking, weaker in his hands, he could not lift his leg against gravity any more, his bowel and bladder issue has much improved,   He is going to have manual wheelchair soon.  REVIEW OF SYSTEMS: Out of a complete 14 system review of symptoms, the patient complains only of the following symptoms, and all other reviewed systems are negative. Weakness, walking difficulty  ALLERGIES: Allergies  Allergen Reactions  . No Known Allergies     HOME MEDICATIONS: Outpatient Medications Prior to Visit  Medication Sig Dispense Refill  . aspirin 81 MG  tablet Take 81 mg by mouth daily.    . baclofen (LIORESAL) 20 MG tablet TAKE 1 TABLET BY MOUTH 3  TIMES DAILY 270 tablet 1  . baclofen (LIORESAL) 20 MG tablet TAKE 1 TABLET BY MOUTH 3  TIMES DAILY 270 tablet 0  . Blood Glucose Calibration (ACCU-CHEK AVIVA) SOLN     . cloNIDine (CATAPRES) 0.1 MG tablet Take 1 tablet (0.1 mg total) by mouth 2 (two) times daily. 180 tablet 3  . Cyanocobalamin (VITAMIN B-12 PO) Take 1 tablet by mouth 2 (two) times daily. Patient is unsure of dose    . ferrous sulfate 325 (65 FE) MG tablet Take 1 tablet (325 mg total) by mouth daily with breakfast. 90 tablet 3  . finasteride (PROSCAR) 5 MG tablet Take 1 tablet (5 mg total) by mouth daily. 90 tablet 3  . glucose blood (ACCU-CHEK AVIVA PLUS) test strip TEST ONCE DAILY (AM fasting). E11.8 100 each 3  . Incontinence Supply Disposable (DEPEND ADJUSTABLE UNDERWEAR LG) MISC Use as needed. 30 each 12  . interferon beta-1a (AVONEX) 30 MCG injection Inject 30 mcg every 7 (seven) days into the muscle. 12 each 4  . Interferon Beta-1a 30 MCG/0.5ML AJKT Inject 30 mcg into the muscle once a week. On Sunday 5 each 3  . Lancets Misc. (ACCU-CHEK FASTCLIX LANCET) KIT 1 Units by Does not apply route daily before breakfast. 1 kit 11  . meloxicam (MOBIC) 15 MG tablet TAKE 1 TABLET BY MOUTH  DAILY AS NEEDED FOR PAIN 90 tablet 0  .  metFORMIN (GLUCOPHAGE) 1000 MG tablet Take 1 tablet (1,000 mg total) by mouth 2 (two) times daily with a meal. 180 tablet 3  . Multiple Vitamin (MULTIVITAMIN) tablet Take 1 tablet by mouth daily.    . Olmesartan-amLODIPine-HCTZ 40-10-12.5 MG TABS Take 1 tablet by mouth daily. 90 tablet 3  . omeprazole (PRILOSEC) 20 MG capsule Take 1 capsule (20 mg total) by mouth daily. 90 capsule 3  . oxybutynin (DITROPAN-XL) 5 MG 24 hr tablet TAKE 1 TABLET BY MOUTH AT  BEDTIME 90 tablet 1  . pioglitazone (ACTOS) 30 MG tablet Take 1 tablet (30 mg total) by mouth daily. 90 tablet 3  . pregabalin (LYRICA) 75 MG capsule Take 1  capsule (75 mg total) by mouth 2 (two) times daily. 180 capsule 1  . tamsulosin (FLOMAX) 0.4 MG CAPS capsule TAKE 1 CAPSULE BY MOUTH  DAILY 90 capsule 3  . traMADol (ULTRAM) 50 MG tablet Take 1 tablet (50 mg total) by mouth at bedtime as needed for severe pain. for pain 30 tablet 5  . traZODone (DESYREL) 50 MG tablet TAKE 1 TABLET BY MOUTH AT  BEDTIME 90 tablet 3   No facility-administered medications prior to visit.     PAST MEDICAL HISTORY: Past Medical History:  Diagnosis Date  . Abnormality of gait   . Acute delirium 12/11/2011  . CTS (carpal tunnel syndrome) 09/26/2015  . Diabetes mellitus   . Hypercholesterolemia   . Hypertension   . Kidney stones   . Leukocytopenia 05/22/2013  . MS (multiple sclerosis) [facility])    sees [facility]  . 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 BACK SURGERY    . COLONOSCOPY    . LUMBAR LAMINECTOMY      FAMILY HISTORY: Family History  Problem Relation Age of Onset  . Hypertension Mother   . Liver cancer Father   . Hypertension Sister     SOCIAL HISTORY: Social History   Socioeconomic History  . Marital status: Legally Separated    Spouse name: Not on file  . Number of children: 1  . Years of education: 10  . Highest education level: Not on file  Occupational History  . Occupation: Disability  Social Needs  . Financial resource strain: Not on file  . Food insecurity:    Worry: Not on file    Inability: Not on file  . Transportation needs:    Medical: Not on file    Non-medical: Not on file  Tobacco Use  . Smoking status: Never Smoker  . Smokeless tobacco: Never Used  Substance and Sexual Activity  . Alcohol use: No  . Drug use: No  . Sexual activity: Not Currently  Lifestyle  . Physical activity:    Days per week: Not on file    Minutes per session: Not on file  . Stress: Not on file   Relationships  . Social connections:    Talks on phone: Not on file    Gets together: Not on file    Attends religious service: Not on file    Active member of club or organization: Not on file    Attends meetings of clubs or organizations: Not on file    Relationship status: Not on file  . Intimate partner violence:    Fear of current or ex partner: Not on file    Emotionally abused: Not on  file    Physically abused: Not on file    Forced sexual activity: Not on file  Other Topics Concern  . Not on file  Social History Narrative   Work or [place] alone, feels safe      Spiritual Beliefs: Christian      Lifestyle: works out with friend 3 days per week at [employer]; diet is poor - likes to [employer] but like unhealthy options      Right-handed.    2 cups caffeine per day.   PHYSICAL EXAM  Vitals:   03/23/18 0932  BP: (!) 146/79  Pulse: (!) 101  Weight: 160 lb 8 oz (72.8 kg)  Height: 5' 7" (1.702 m)   Body mass index is 25.14 kg/m.   PHYSICAL EXAMNIATION:  Gen: NAD, conversant, well nourised, obese, well groomed                     Cardiovascular: Regular rate rhythm, no peripheral edema, warm, nontender. Eyes: Conjunctivae clear without exudates or hemorrhage Neck: Supple, no carotid bruits. Pulmonary: Clear to auscultation bilaterally   NEUROLOGICAL EXAM:  MENTAL STATUS: Speech:    Speech is normal; fluent and spontaneous with normal comprehension.  Cognition:     Orientation to time, place and person     Normal recent and remote memory     Normal Attention span and concentration     Normal Language, naming, repeating,spontaneous speech     Fund of knowledge   CRANIAL NERVES: CN II: Visual fields are full to confrontation.Pupils are round equal and briskly reactive to light. CN III, IV, VI: extraocular movement are normal. No ptosis. CN V: Facial sensation is intact to pinprick in all 3 divisions bilaterally. Corneal responses are  intact.  CN VII: Face is symmetric with normal eye closure and smile. CN VIII: Hearing is normal to rubbing fingers CN IX, X: Palate elevates symmetrically. Phonation is normal. CN XI: Head turning and shoulder shrug are intact CN XII: Tongue is midline with normal movements and no atrophy.  MOTOR: Bilateral hand intrinsic muscle atrophy, especially bilateral abductor pollicis brevis, mild bilateral grip, finger abduction weakness, no significant weakness at bilateral upper extremity proximal muscles. He has prominent bilateral lower extremity muscle weakness, no significant spasticity, hip flexion (R/L) 1/1, knee extension 2/2 knee flexion 3/3, ankle dorsiflexion 4//4, ankle plantar flexion 3/1.  REFLEXES: Reflexes are 1 and symmetric at the biceps, triceps,3/3 knees, and absent at ankles.    SENSORY: Decreased vibratory sensation bilateral ankle  COORDINATION: Rapid alternating movements and fine finger movements are intact.   GAIT/STANCE: He needs 2 crutches to ambulate, leaning forward.   DIAGNOSTIC DATA (LABS, IMAGING, TESTING) - I reviewed patient records, labs, notes, testing and imaging myself where available.  Lab Results  Component Value Date   WBC 3.1 (L) 02/09/2018   HGB 11.9 (L) 02/09/2018   HCT 35.2 (L) 02/09/2018   MCV 92.9 02/09/2018   PLT 286.0 02/09/2018      Component Value Date/Time   NA 140 02/09/2018 1056   NA 144 01/04/2014 0943   K 3.6 02/09/2018 1056   K 3.8 01/04/2014 0943   CL 102 02/09/2018 1056   CO2 28 02/09/2018 1056   CO2 28 01/04/2014 0943   GLUCOSE 115 (H) 02/09/2018 1056   GLUCOSE 113 01/04/2014 0943   BUN 26 (H) 02/09/2018 1056   BUN 18.1 01/04/2014 0943   CREATININE 1.03 02/09/2018 1056   CREATININE  1.0 01/04/2014 0943   CALCIUM 10.1 02/09/2018 1056   CALCIUM 10.2 01/04/2014 0943   PROT 6.7 09/10/2017 0934   PROT 7.4 01/04/2014 0943   ALBUMIN 4.4 09/10/2017 0934   ALBUMIN 4.6 01/04/2014 0943   AST 23 09/10/2017 0934   AST 30  01/04/2014 0943   ALT 14 09/10/2017 0934   ALT 17 01/04/2014 0943   ALKPHOS 70 09/10/2017 0934   ALKPHOS 64 01/04/2014 0943   BILITOT 0.3 09/10/2017 0934   BILITOT 0.41 01/04/2014 0943   GFRNONAA >60 01/03/2016 0517   GFRNONAA >89 12/25/2011 1132   GFRAA >60 01/03/2016 0517   GFRAA >89 12/25/2011 1132   Lab Results  Component Value Date   CHOL 176 02/15/2017   HDL 80.50 02/15/2017   LDLCALC 76 02/15/2017   TRIG 100.0 02/15/2017   CHOLHDL 2 02/15/2017   Lab Results  Component Value Date   HGBA1C 6.3 02/09/2018   Lab Results  Component Value Date   VITAMINB12 748 01/01/2016   Lab Results  Component Value Date   TSH 0.63 09/10/2017      ASSESSMENT AND PLAN 67 y.o. year old male   Probable multiple sclerosis Gait abnormality  Has been stable on Avonex for many years  I do not think the probable multiple sclerosis diagnosis contributed to his progressive worsening functional disability, I have suggested him stop Avonex.  His gait difficulty are combination of cervical myelopathy, radiculopathy, thoracic myelopathy, previous lumbar radiculopathy, status post decompression surgery, severe bilateral carpal tunnel syndromes.  He would benefit wheelchair, likely qualify a electronic wheelchair    Marcial Pacas, M.D. Ph.D.  [facility] [place], [place] [address] Phone: [phone] Fax:      [phone]

## 2018-04-04 ENCOUNTER — Other Ambulatory Visit: Payer: Self-pay | Admitting: Neurology

## 2018-04-04 DIAGNOSIS — G35 Multiple sclerosis: Secondary | ICD-10-CM | POA: Diagnosis not present

## 2018-04-04 DIAGNOSIS — G8220 Paraplegia, unspecified: Secondary | ICD-10-CM | POA: Diagnosis not present

## 2018-04-04 DIAGNOSIS — M4316 Spondylolisthesis, lumbar region: Secondary | ICD-10-CM | POA: Diagnosis not present

## 2018-04-04 DIAGNOSIS — M4802 Spinal stenosis, cervical region: Secondary | ICD-10-CM | POA: Diagnosis not present

## 2018-04-08 ENCOUNTER — Telehealth: Payer: Self-pay | Admitting: Nurse Practitioner

## 2018-04-08 DIAGNOSIS — E1142 Type 2 diabetes mellitus with diabetic polyneuropathy: Secondary | ICD-10-CM

## 2018-04-08 NOTE — Telephone Encounter (Signed)
Ok to order 

## 2018-04-08 NOTE — Telephone Encounter (Signed)
Referral entered  

## 2018-04-08 NOTE — Telephone Encounter (Signed)
Please advise, ok to put in referral for nutritionist?    Copied from [place] [phone]. Topic: Referral - Request for Referral >> Apr 08, 2018 11:20 AM Scherrie Gerlach wrote: Pt had social worker service ordered from [facility] services and they recommend pt see a nutritionalist. Pt requesting this referral

## 2018-04-14 ENCOUNTER — Ambulatory Visit: Payer: Medicare Other | Admitting: Registered"

## 2018-04-15 DIAGNOSIS — E119 Type 2 diabetes mellitus without complications: Secondary | ICD-10-CM | POA: Diagnosis not present

## 2018-04-15 DIAGNOSIS — Z0101 Encounter for examination of eyes and vision with abnormal findings: Secondary | ICD-10-CM | POA: Diagnosis not present

## 2018-04-15 LAB — HM DIABETES EYE EXAM

## 2018-04-18 IMAGING — CR DG LUMBAR SPINE 2-3V
2 series · 2 of 2 positions shown · non-contrast
Comparison: Two portable intraoperative lateral views of the lumbar
spine are compared to prior MRI lumbar spine 08/26/2015

CLINICAL DATA: L5-S1 PLIF

EXAM:
LUMBAR SPINE - 2-3 VIEW

[lat (1 of 2)]
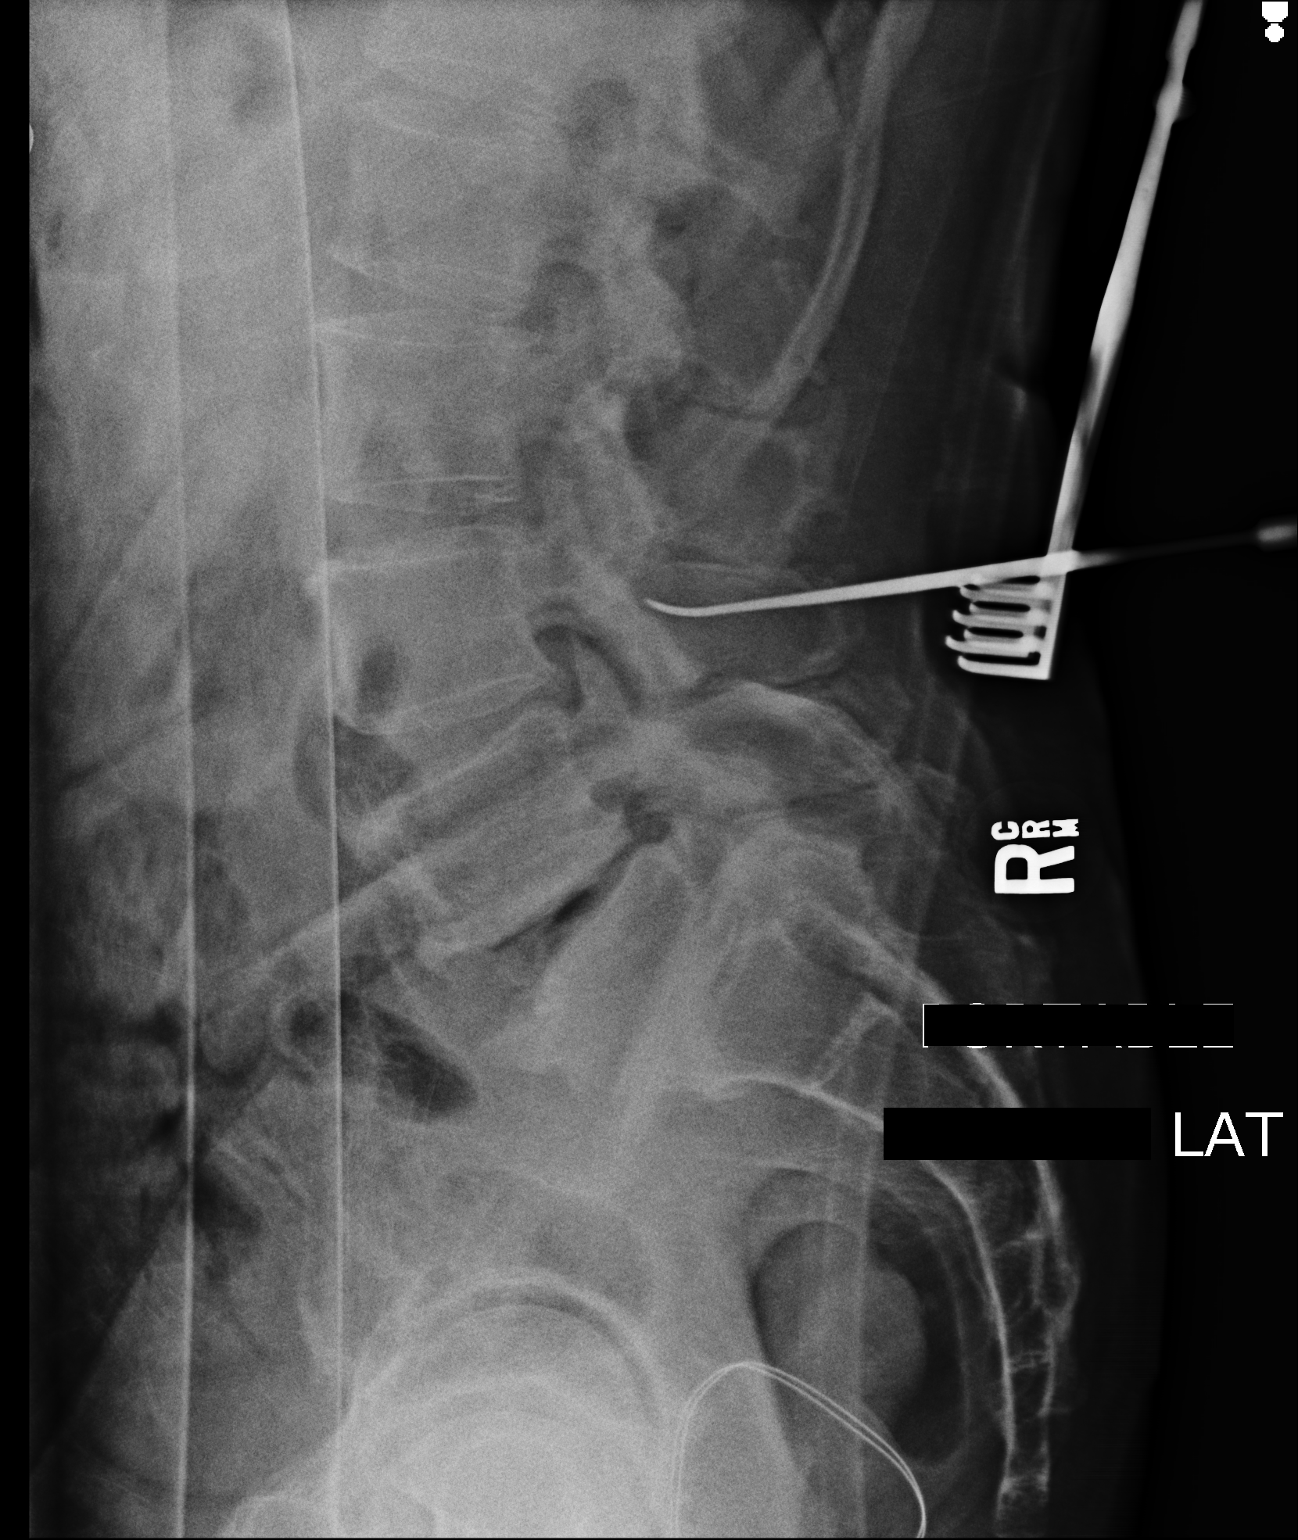

[lat (2 of 2)]
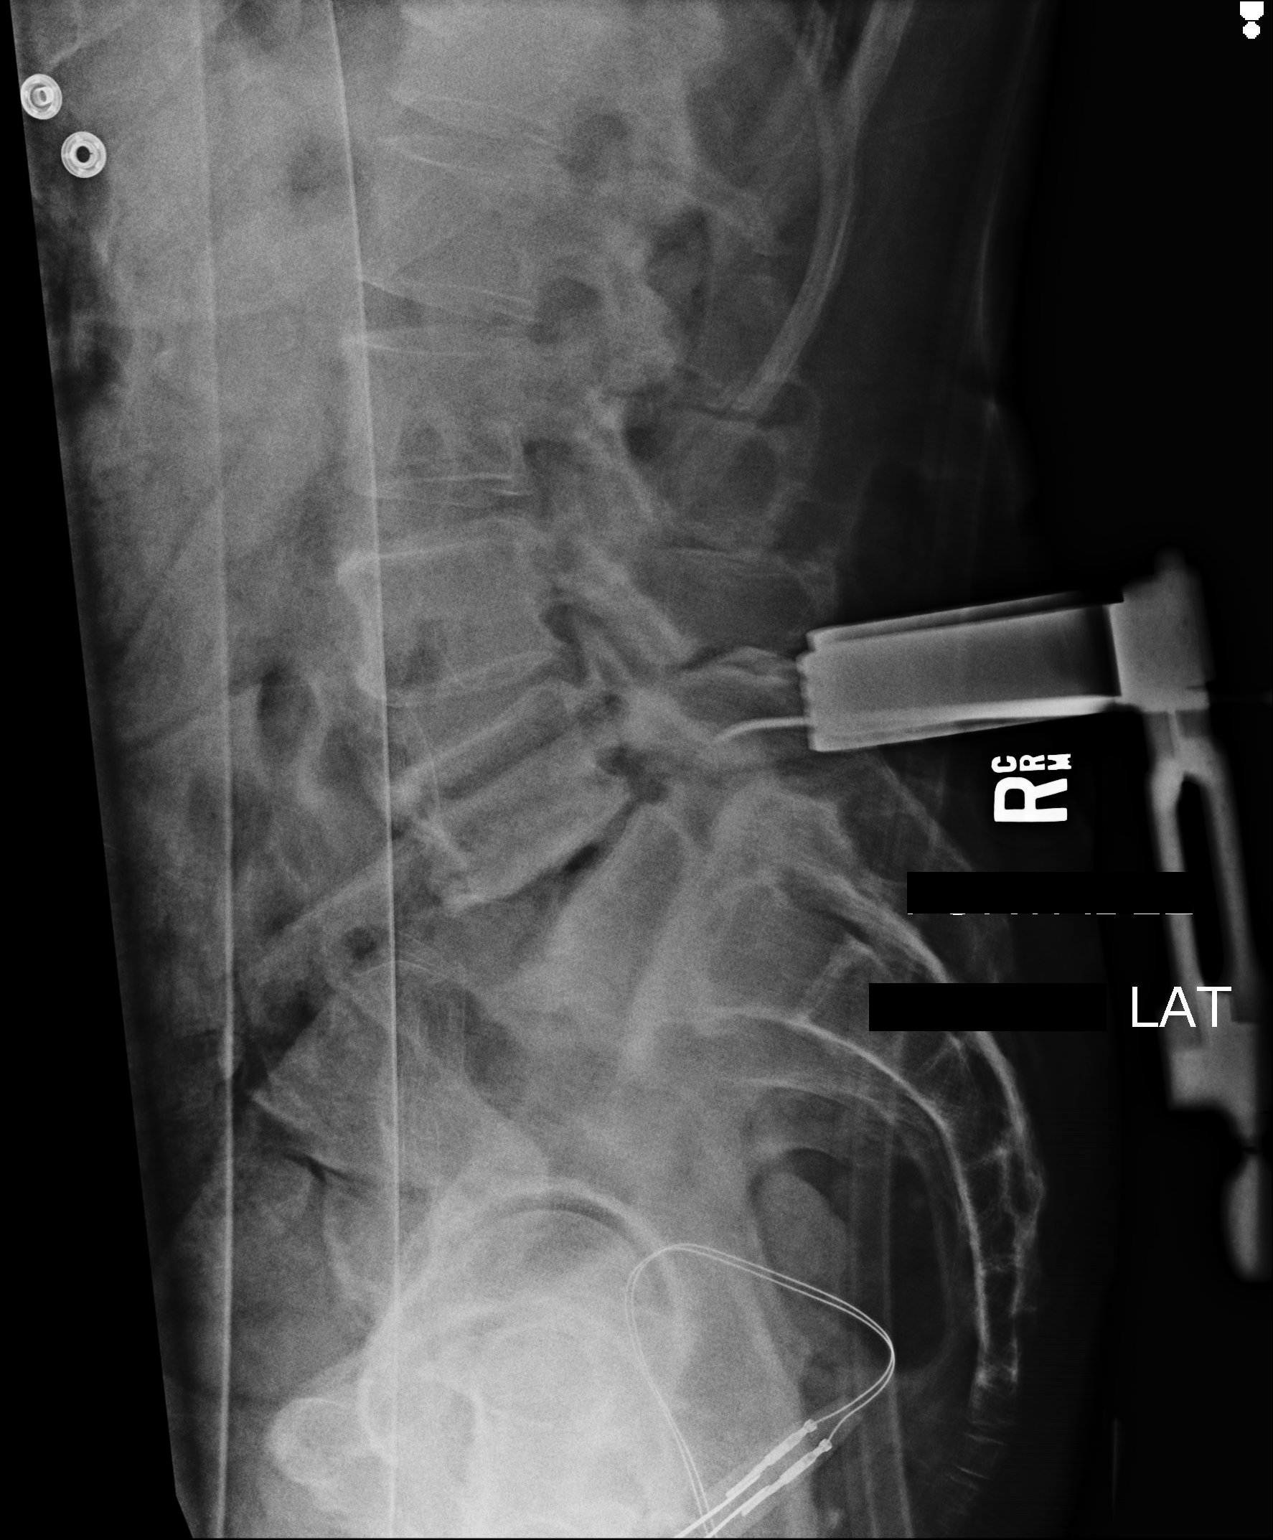

[2 of 2 positions shown; findings below may reference images not displayed]

FINDINGS: Prior MRI lumbar spine is labeled with incorporation of the L5
vertebral body with S1 and last open disc space as the L4-L5.

Current radiographs are labeled similar to the prior MR for
consistency.

Recommend correlation of this numbering system with that used on any
additional prior imaging.

Image #1 at 2158 hours: L5-S1 fusion. Vacuum phenomenon L4-L5.
Metallic probe via dorsal approach projects dorsal to what is
labeled the mid L3 level.

Image #2 at 7506 hours: L5-S1 fusion. Curved metallic probe via
dorsal approach projects dorsal to what is labeled the L4-L5 disc
space.
IMPRESSION: Numbering of lumbar spine to match MR numbering system as above -
please note lumbar numbering as discussed above and as discussed in
report of prior MR lumbar spine.

Intraoperative lumbar localization images as above.

## 2018-04-18 IMAGING — RF DG C-ARM 61-120 MIN
1 series · 2 of 2 positions shown · non-contrast
Comparison: Intraoperative images 7 07/01/2015, MRI lumbar spine
08/26/2015

FLUOROSCOPY TIME:  0 minutes 32 seconds

Images obtained: 2

CLINICAL DATA: Lumbar fusion

EXAM:
LUMBAR SPINE - 2-3 VIEW; DG C-ARM 61-120 MIN

[Series 1: run · 2 of 2 slices shown]
[im 1/2]
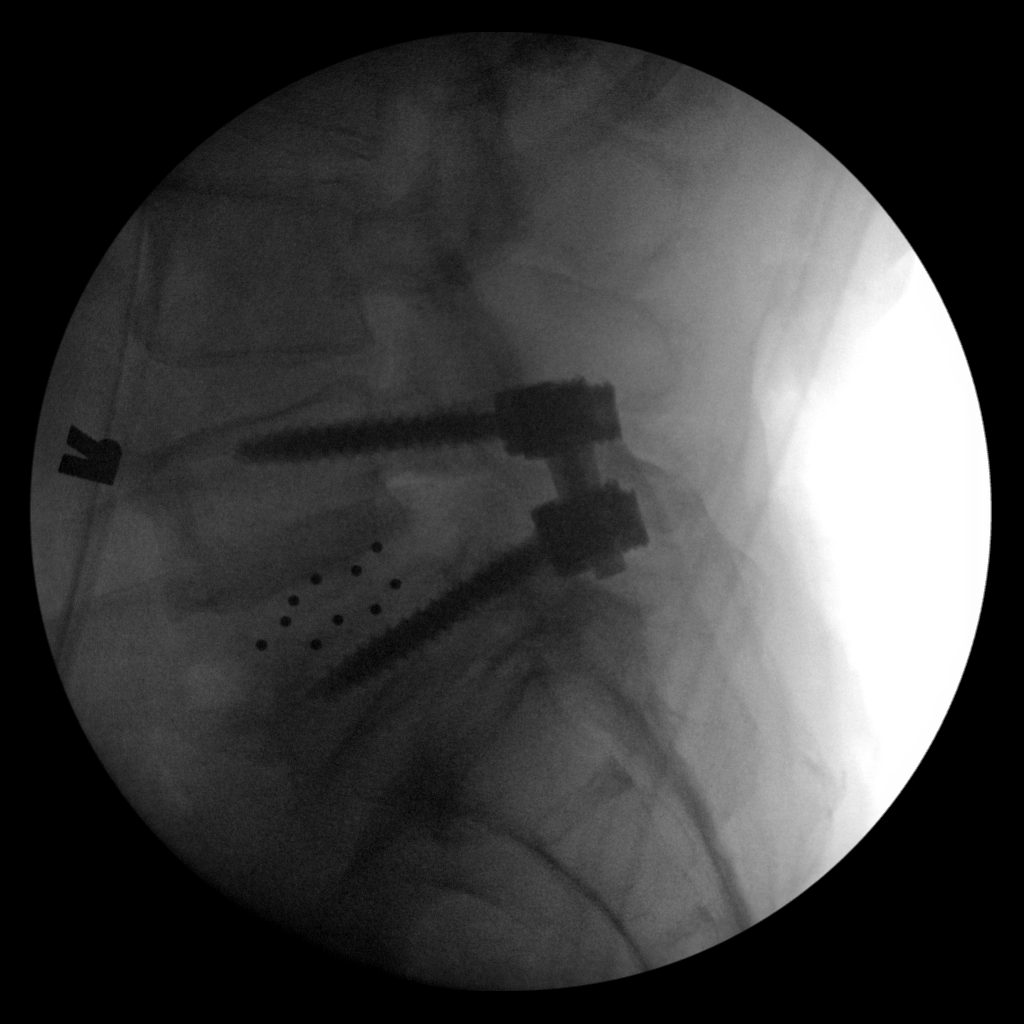
[im 2/2]
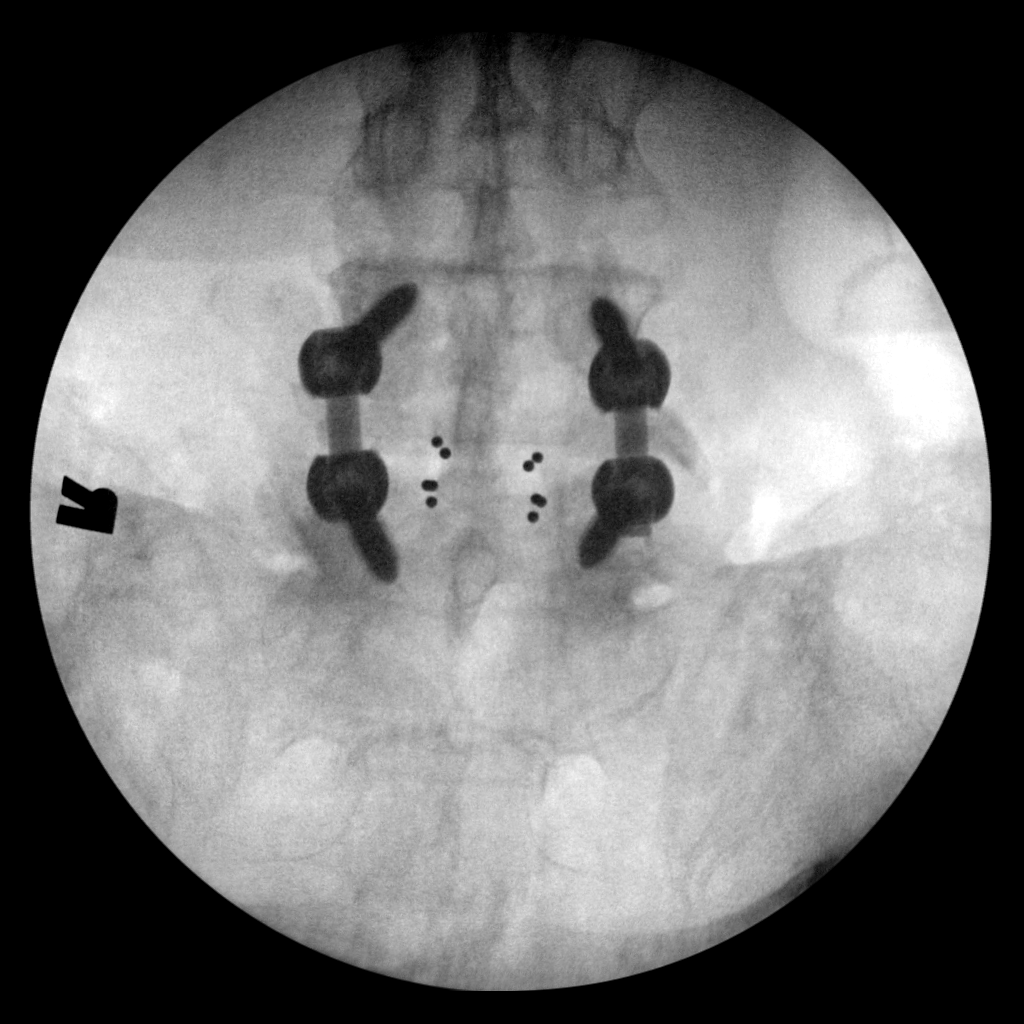

[2 of 2 positions shown; findings below may reference images not displayed]

FINDINGS: Prior MR lumbar spine labeled demonstrating L5-S1 fusion.

Preceding intraoperative images were labeled similarly for
consistency.

Current intraoperative C-arm images labeled similarly as well for
consistency.

Two digital C-arm fluoroscopic images demonstrate placement of
BILATERAL pedicle screws and bars at what is labeled as L4-L5 on the
prior MR.

Intervening disc prosthesis at L4-L5 disc space.
IMPRESSION: Lower lumbar fusion, labeled as L4-L5 to match the numbering system
utilized on most recent MRI lumbar spine as discussed above.

## 2018-04-24 ENCOUNTER — Other Ambulatory Visit: Payer: Self-pay | Admitting: Neurology

## 2018-04-25 ENCOUNTER — Other Ambulatory Visit: Payer: Self-pay | Admitting: *Deleted

## 2018-04-25 MED ORDER — MELOXICAM 15 MG PO TABS: 15.0000 mg | ORAL_TABLET | Freq: Every day | ORAL | 1 refills | Status: DC | PRN

## 2018-04-25 MED ORDER — BACLOFEN 20 MG PO TABS: 20.0000 mg | ORAL_TABLET | Freq: Three times a day (TID) | ORAL | 3 refills | Status: DC

## 2018-04-25 MED ORDER — TRAMADOL HCL 50 MG PO TABS: 50.0000 mg | ORAL_TABLET | Freq: Every evening | ORAL | 5 refills | Status: DC | PRN

## 2018-05-04 DIAGNOSIS — G35 Multiple sclerosis: Secondary | ICD-10-CM | POA: Diagnosis not present

## 2018-05-26 ENCOUNTER — Other Ambulatory Visit: Payer: Self-pay | Admitting: Neurology

## 2018-05-31 ENCOUNTER — Other Ambulatory Visit: Payer: Self-pay | Admitting: Neurology

## 2018-06-03 ENCOUNTER — Ambulatory Visit: Payer: Medicare Other | Admitting: Nurse Practitioner

## 2018-06-04 DIAGNOSIS — G35 Multiple sclerosis: Secondary | ICD-10-CM | POA: Diagnosis not present

## 2018-06-10 ENCOUNTER — Encounter: Payer: Self-pay | Admitting: Nurse Practitioner

## 2018-06-10 ENCOUNTER — Ambulatory Visit (INDEPENDENT_AMBULATORY_CARE_PROVIDER_SITE_OTHER): Payer: Medicare Other | Admitting: Nurse Practitioner

## 2018-06-10 VITALS — BP 128/70 | HR 76 | Temp 98.9°F

## 2018-06-10 DIAGNOSIS — N3000 Acute cystitis without hematuria: Secondary | ICD-10-CM

## 2018-06-10 DIAGNOSIS — I152 Hypertension secondary to endocrine disorders: Secondary | ICD-10-CM | POA: Diagnosis not present

## 2018-06-10 DIAGNOSIS — Z23 Encounter for immunization: Secondary | ICD-10-CM | POA: Diagnosis not present

## 2018-06-10 DIAGNOSIS — E1142 Type 2 diabetes mellitus with diabetic polyneuropathy: Secondary | ICD-10-CM | POA: Diagnosis not present

## 2018-06-10 DIAGNOSIS — R6889 Other general symptoms and signs: Secondary | ICD-10-CM | POA: Diagnosis not present

## 2018-06-10 DIAGNOSIS — Z136 Encounter for screening for cardiovascular disorders: Secondary | ICD-10-CM | POA: Diagnosis not present

## 2018-06-10 DIAGNOSIS — Z13220 Encounter for screening for lipoid disorders: Secondary | ICD-10-CM | POA: Diagnosis not present

## 2018-06-10 DIAGNOSIS — G629 Polyneuropathy, unspecified: Secondary | ICD-10-CM

## 2018-06-10 DIAGNOSIS — L608 Other nail disorders: Secondary | ICD-10-CM

## 2018-06-10 LAB — BASIC METABOLIC PANEL
BUN: 28 mg/dL — ABNORMAL HIGH (ref 6–23)
CO2: 29 mEq/L (ref 19–32)
Calcium: 10.1 mg/dL (ref 8.4–10.5)
Chloride: 104 mEq/L (ref 96–112)
Creatinine, Ser: 1.11 mg/dL (ref 0.40–1.50)
GFR: 69.64 mL/min (ref >60.00)
Glucose, Bld: 113 mg/dL — ABNORMAL HIGH (ref 70–99)
Potassium: 4.3 mEq/L (ref 3.5–5.1)
Sodium: 142 mEq/L (ref 135–145)

## 2018-06-10 LAB — LIPID PANEL
CHOL/HDL RATIO: 3
Cholesterol: 169 mg/dL (ref 0–200)
HDL: 51.9 mg/dL (ref >39.00)
LDL Cholesterol: 101 mg/dL — ABNORMAL HIGH (ref 0–99)
NonHDL: 117.4
Triglycerides: 81 mg/dL (ref 0.0–149.0)
VLDL: 16.2 mg/dL (ref 0.0–40.0)

## 2018-06-10 LAB — HEMOGLOBIN A1C: Hgb A1c MFr Bld: 6.1 % (ref 4.6–6.5)

## 2018-06-10 LAB — PSA, MEDICARE: PSA: 0.52 ng/ml (ref 0.10–4.00)

## 2018-06-10 MED ORDER — OXYBUTYNIN CHLORIDE ER 10 MG PO TB24: 10.0000 mg | ORAL_TABLET | Freq: Every day | ORAL | 1 refills | Status: DC

## 2018-06-10 MED ORDER — PREGABALIN 75 MG PO CAPS: 75.0000 mg | ORAL_CAPSULE | Freq: Two times a day (BID) | ORAL | 1 refills | Status: DC

## 2018-06-10 NOTE — Patient Instructions (Addendum)
Have eye exam report faxed to me when completed Make appt with opthalmology annually.  Go to lab for blood draw and urine collection. Will increase oxybutin to 20my if UA is negative for UTI.  Discuss benefits of protein supplement (creatinin) and provigil with neurology.

## 2018-06-10 NOTE — Progress Notes (Addendum)
Subjective:  Patient ID: Larry [name], male    DOB: [birth year]  Age: 68 y.o. MRN: [id number]  CC: Follow-up (f/u DM, HTN, LIPID, blood sugar 109 this am, BP been doing ok,/ Fasting/  med consult (Creatinine for muscles?)/ bladder  still getting up every couple hours/ referral for eye care/needs foot exam/ wants flu shot)  Urinary Frequency   This is a chronic problem. The current episode started more than 1 year ago. The problem occurs intermittently. The problem has been waxing and waning. The pain is at a severity of 0/10. The patient is experiencing no pain. There has been no fever. Larry [name] is not sexually active. There is no history of pyelonephritis. Associated symptoms include frequency and hesitancy. Pertinent negatives include no chills, discharge, flank pain, hematuria, nausea or urgency. Larry [name] has tried nothing for the symptoms. His past medical history is significant for urinary stasis.  use of oxybitinin, flomax and proscar  increased urinary frequency in last 38month. Last appt with urology 03/2018.  DM: Stable hgbA1c at 6.1 Use of metformin and actos Goes to [place] will schedule an appt. Chronic neuropathy, mild improvement with gabapentin  HTN:  stable with omesartan, amlodipine and HCTZ  Generalized weakness: No improvement with home PT. Fatigue is worsen in morning, but improves in afternoon and evening. Last appt with neurology 03/2018 Stable iron deficient anemia, normal B12, folate and vit. D  Reviewed past Medical, Social and Family history today.  Outpatient Medications Prior to Visit  Medication Sig Dispense Refill  . aspirin 81 MG tablet Take 81 mg by mouth daily.    . baclofen (LIORESAL) 20 MG tablet Take 1 tablet (20 mg total) by mouth 3 (three) times daily. 270 tablet 3  . Blood Glucose Calibration (ACCU-CHEK AVIVA) SOLN     . cloNIDine (CATAPRES) 0.1 MG tablet Take 1 tablet (0.1 mg total) by mouth 2 (two) times daily. 180 tablet 3  . Cyanocobalamin  (VITAMIN B-12 PO) Take 1 tablet by mouth 2 (two) times daily. Patient is unsure of dose    . ferrous sulfate 325 (65 FE) MG tablet Take 1 tablet (325 mg total) by mouth daily with breakfast. 90 tablet 3  . finasteride (PROSCAR) 5 MG tablet Take 1 tablet (5 mg total) by mouth daily. 90 tablet 3  . glucose blood (ACCU-CHEK AVIVA PLUS) test strip TEST ONCE DAILY (AM fasting). E11.8 100 each 3  . Incontinence Supply Disposable (DEPEND ADJUSTABLE UNDERWEAR LG) MISC Use as needed. 30 each 12  . Lancets Misc. (ACCU-CHEK FASTCLIX LANCET) KIT 1 Units by Does not apply route daily before breakfast. 1 kit 11  . meloxicam (MOBIC) 15 MG tablet Take 1 tablet (15 mg total) by mouth daily as needed. for pain 90 tablet 1  . metFORMIN (GLUCOPHAGE) 1000 MG tablet Take 1 tablet (1,000 mg total) by mouth 2 (two) times daily with a meal. 180 tablet 3  . Multiple Vitamin (MULTIVITAMIN) tablet Take 1 tablet by mouth daily.    . Olmesartan-amLODIPine-HCTZ 40-10-12.5 MG TABS Take 1 tablet by mouth daily. 90 tablet 3  . omeprazole (PRILOSEC) 20 MG capsule Take 1 capsule (20 mg total) by mouth daily. 90 capsule 3  . pioglitazone (ACTOS) 30 MG tablet Take 1 tablet (30 mg total) by mouth daily. 90 tablet 3  . tamsulosin (FLOMAX) 0.4 MG CAPS capsule TAKE 1 CAPSULE BY MOUTH  DAILY 90 capsule 3  . traMADol (ULTRAM) 50 MG tablet Take 1 tablet (50 mg total) by mouth at bedtime  as needed for severe pain. for pain 30 tablet 5  . traZODone (DESYREL) 50 MG tablet TAKE 1 TABLET BY MOUTH AT  BEDTIME 90 tablet 3  . oxybutynin (DITROPAN-XL) 5 MG 24 hr tablet TAKE 1 TABLET BY MOUTH AT  BEDTIME 90 tablet 3  . pregabalin (LYRICA) 75 MG capsule Take 1 capsule (75 mg total) by mouth 2 (two) times daily. 180 capsule 1   No facility-administered medications prior to visit.     ROS See HPI  Objective:  BP 128/70   Pulse 76   Temp 98.9 F (37.2 C) (Oral)   SpO2 99%   BP Readings from Last 3 Encounters:  06/10/18 128/70  03/23/18 (!)  146/79  02/09/18 130/68    Wt Readings from Last 3 Encounters:  03/23/18 160 lb 8 oz (72.8 kg)  02/09/18 157 lb (71.2 kg)  11/29/17 146 lb (66.2 kg)    Physical Exam Neck:     Musculoskeletal: Normal range of motion and neck supple.  Cardiovascular:     Rate and Rhythm: Normal rate and regular rhythm.     Pulses: Normal pulses.     Heart sounds: Gallop present.   Pulmonary:     Effort: Pulmonary effort is normal.     Breath sounds: Normal breath sounds.  Abdominal:     General: Abdomen is flat. Bowel sounds are normal.     Palpations: Abdomen is soft.  Musculoskeletal:     Right lower leg: No edema.     Left lower leg: No edema.  Skin:    Findings: No erythema or rash.     Comments: Completed diabetic foot exam  Neurological:     Mental Status: Larry [name] is alert and oriented to person, place, and time.  Psychiatric:        Mood and Affect: Mood normal.        Behavior: Behavior normal.        Thought Content: Thought content normal.        Judgment: Judgment normal.     Lab Results  Component Value Date   WBC 3.1 (L) 02/09/2018   HGB 11.9 (L) 02/09/2018   HCT 35.2 (L) 02/09/2018   PLT 286.0 02/09/2018   GLUCOSE 113 (H) 06/10/2018   CHOL 169 06/10/2018   TRIG 81.0 06/10/2018   HDL 51.90 06/10/2018   LDLCALC 101 (H) 06/10/2018   ALT 14 09/10/2017   AST 23 09/10/2017   NA 142 06/10/2018   K 4.3 06/10/2018   CL 104 06/10/2018   CREATININE 1.11 06/10/2018   BUN 28 (H) 06/10/2018   CO2 29 06/10/2018   TSH 0.63 09/10/2017   PSA 0.52 06/10/2018   INR 1.01 02/16/2011   HGBA1C 6.1 06/10/2018   MICROALBUR 4.7 (H) 07/27/2016    Assessment & Plan:   Larry [name] was seen today for follow-up.  Diagnoses and all orders for this visit:  Hypertension due to endocrine disorder -     Basic metabolic panel  Type 2 diabetes mellitus with diabetic polyneuropathy, without long-term current use of insulin (HCC) -     Hemoglobin A1c -     Basic metabolic panel  Acute  cystitis without hematuria -     Urinalysis w microscopic + reflex cultur -     PSA, Medicare -     REFLEXIVE URINE CULTURE -     oxybutynin (DITROPAN-XL) 10 MG 24 hr tablet; Take 1 tablet (10 mg total) by mouth at bedtime. -     ciprofloxacin (  CIPRO) 500 MG tablet; Take 1 tablet (500 mg total) by mouth 2 (two) times daily.  Encounter for lipid screening for cardiovascular disease -     Lipid panel  Need for influenza vaccination -     Flu vaccine HIGH DOSE PF (Fluzone High dose)  Discoloration and thickening of nails both feet -     Ambulatory referral to Podiatry  Peripheral polyneuropathy -     pregabalin (LYRICA) 75 MG capsule; Take 1 capsule (75 mg total) by mouth 2 (two) times daily.  Other orders -     Urine Culture   I have changed Larry [name]'s oxybutynin. I am also having him start on ciprofloxacin. Additionally, I am having him maintain his multivitamin, aspirin, ACCU-CHEK AVIVA, Cyanocobalamin (VITAMIN B-12 PO), DEPEND ADJUSTABLE UNDERWEAR LG, glucose blood, ACCU-CHEK FASTCLIX LANCET, ferrous sulfate, finasteride, metFORMIN, Olmesartan-amLODIPine-HCTZ, cloNIDine, omeprazole, pioglitazone, tamsulosin, baclofen, meloxicam, traMADol, traZODone, and pregabalin.  Meds ordered this encounter  Medications  . pregabalin (LYRICA) 75 MG capsule    Sig: Take 1 capsule (75 mg total) by mouth 2 (two) times daily.    Dispense:  180 capsule    Refill:  1    Order Specific Question:   Supervising Provider    Answer:   MATTHEWS, CODY [[id number]]  . oxybutynin (DITROPAN-XL) 10 MG 24 hr tablet    Sig: Take 1 tablet (10 mg total) by mouth at bedtime.    Dispense:  90 tablet    Refill:  1    Order Specific Question:   Supervising Provider    Answer:   MATTHEWS, CODY [[id number]]  . ciprofloxacin (CIPRO) 500 MG tablet    Sig: Take 1 tablet (500 mg total) by mouth 2 (two) times daily.    Dispense:  20 tablet    Refill:  0    Order Specific Question:   Supervising Provider    Answer:    MATTHEWS, CODY [[id number]]    Problem List Items Addressed This Visit      Cardiovascular and Mediastinum   Hypertension - Primary   Relevant Orders   Basic metabolic panel (Completed)     Endocrine   DM (diabetes mellitus) ([place])   Relevant Orders   Hemoglobin A1c (Completed)   Basic metabolic panel (Completed)     Nervous and Auditory   Peripheral neuropathy   Relevant Medications   pregabalin (LYRICA) 75 MG capsule    Other Visit Diagnoses    Acute cystitis without hematuria       Relevant Medications   oxybutynin (DITROPAN-XL) 10 MG 24 hr tablet   ciprofloxacin (CIPRO) 500 MG tablet   Other Relevant Orders   Urinalysis w microscopic + reflex cultur (Completed)   PSA, Medicare (Completed)   REFLEXIVE URINE CULTURE (Completed)   Encounter for lipid screening for cardiovascular disease       Relevant Orders   Lipid panel (Completed)   Need for influenza vaccination       Relevant Orders   Flu vaccine HIGH DOSE PF (Fluzone High dose) (Completed)   Discoloration and thickening of nails both feet       Relevant Orders   Ambulatory referral to Podiatry       Follow-up: Return in about 6 months (around 12/09/2018) for DM and HTN, hyperlipidemia (fasting).  Wilfred Lacy, NP

## 2018-06-12 LAB — URINALYSIS W MICROSCOPIC + REFLEX CULTURE
Bacteria, UA: NONE SEEN /HPF
Bilirubin Urine: NEGATIVE
Glucose, UA: NEGATIVE
Hgb urine dipstick: NEGATIVE
Hyaline Cast: NONE SEEN /LPF
Ketones, ur: NEGATIVE
Leukocyte Esterase: NEGATIVE
Nitrites, Initial: NEGATIVE
PH: 6.5 (ref 5.0–8.0)
Protein, ur: NEGATIVE
RBC / HPF: NONE SEEN /HPF (ref 0–2)
SPECIFIC GRAVITY, URINE: 1.015 (ref 1.001–1.03)
Squamous Epithelial / LPF: NONE SEEN /HPF (ref <=5)

## 2018-06-12 LAB — URINE CULTURE
MICRO NUMBER:: 44249
SPECIMEN QUALITY:: ADEQUATE

## 2018-06-12 LAB — CULTURE INDICATED

## 2018-06-13 MED ORDER — CIPROFLOXACIN HCL 500 MG PO TABS: 500.0000 mg | ORAL_TABLET | Freq: Two times a day (BID) | ORAL | 0 refills | Status: DC

## 2018-06-13 NOTE — Addendum Note (Signed)
Addended by: Leana Gamer on: 06/13/2018 03:22 PM   Modules accepted: Orders

## 2018-06-14 ENCOUNTER — Telehealth: Payer: Self-pay | Admitting: Neurology

## 2018-06-14 NOTE — Telephone Encounter (Signed)
Pt has returned calll to RN Faith, he is asking for a call back

## 2018-06-14 NOTE — Telephone Encounter (Signed)
Spoke with pt. He reports increased weakness/stiffness in bilat legs over the last 2 wks. Wonders if dietary supplements such as Creatine will help. I have referred him back to his pcp to discuss supplements that may be appropriate for him. However, I will discuss the increased weakness, stiffness with Dr. Krista Blue when she returns to the office tomorrow. His MS is a probable, not a definite dx. He is still taking Avonex; has not decided if he wants to stop it. Missed one inj. last Sunday.  Increased weakness/stiffness started before the missed dose. He had 4 wks. of PT, which he feels helped.  He does have existing degenerative problems with neck, back. I will speak with Dr. Krista Blue tomorrow am to see if she would like to see pt., refer for more PT, refer to NS, and call pt. back. He is aware return call will come tomorrow/fim

## 2018-06-14 NOTE — Telephone Encounter (Signed)
Patient states he is having weak muscle in his lower body and wants to know if it is okay for him to take creatine supplement because he states he is about to start taking it.

## 2018-06-14 NOTE — Telephone Encounter (Signed)
LMOM for pt. to call.  He has MS. Need more information to determine best course. He should also check with his pcp regarding dietary supplements./fim

## 2018-06-15 NOTE — Telephone Encounter (Signed)
Spoke with Larry [name]. and per Dr. Krista Blue, explained that the weakness, stiffness he has in his legs is likely due to the degenerative changes, spinal stenosis in his spine. He will have good days, bad days, and will likely see some progression of these sx. He has had surgery which did not provide much relief.  Spinal stenosis is still severe. He sts. that stiffness is better once he starts moving around. Reports compliance with Baclofen 20mg  in the am, 40mg  qhs. He will continue this. He will call back if needed//fim

## 2018-06-20 ENCOUNTER — Telehealth: Payer: Self-pay | Admitting: Neurology

## 2018-06-20 NOTE — Telephone Encounter (Signed)
Pt has returned call to [place], he is asking for a call back

## 2018-06-20 NOTE — Telephone Encounter (Signed)
Pt states he was informed to stop his injectables and he would like to know if there is something else he can take to replace it since he is still feeling weak. Please advise.

## 2018-06-20 NOTE — Telephone Encounter (Signed)
The patient was last seen on 03/2018.  His Avonex was stopped at that time.    I have left a message requesting the patient to return my call.

## 2018-06-21 NOTE — Telephone Encounter (Signed)
Per vo by Dr. Krista Blue, she would like to see the patient and examine him physically.  He is agreeable to come in.  He has been schedule with her on 06/23/2018.

## 2018-06-21 NOTE — Telephone Encounter (Addendum)
Returned call to patient.  Reports feeling extremely weak and fatigued over the last three weeks.  Denies any signs of infection (UTI, respiratory, etc).  He is sleeping well.  He still has numbness in his feet but this is no worse than his normal.

## 2018-06-23 ENCOUNTER — Encounter: Payer: Self-pay | Admitting: Neurology

## 2018-06-23 ENCOUNTER — Ambulatory Visit: Payer: Medicare Other | Admitting: Neurology

## 2018-06-23 ENCOUNTER — Telehealth: Payer: Self-pay | Admitting: Neurology

## 2018-06-23 VITALS — BP 144/80 | HR 96 | Ht 67.0 in | Wt 173.5 lb

## 2018-06-23 DIAGNOSIS — M48061 Spinal stenosis, lumbar region without neurogenic claudication: Secondary | ICD-10-CM

## 2018-06-23 DIAGNOSIS — M6281 Muscle weakness (generalized): Secondary | ICD-10-CM | POA: Diagnosis not present

## 2018-06-23 DIAGNOSIS — R531 Weakness: Secondary | ICD-10-CM | POA: Diagnosis not present

## 2018-06-23 DIAGNOSIS — M4802 Spinal stenosis, cervical region: Secondary | ICD-10-CM | POA: Diagnosis not present

## 2018-06-23 DIAGNOSIS — M5416 Radiculopathy, lumbar region: Secondary | ICD-10-CM | POA: Diagnosis not present

## 2018-06-23 NOTE — Telephone Encounter (Signed)
[facility] Medicare order sent to GI pt is aware

## 2018-06-23 NOTE — Progress Notes (Signed)
HISTORY OF PRESENT ILLNESS: Larry [name] is a 68 -year-old male, accompanied by her daughter Larry [name], seen in refer by his primary care physician Dr. Rodena Goldmann for evaluation of worsening gait difficulty, multiple sclerosis, initial evaluation was on June 12 2015.  He was a patient of our office in the past, last clinical visit was July 2013, he had a history of diabetes since 2002, also with  diagnosis of relapsing remitting multiple sclerosis, initial presentation was gait difficulty in 1996, he fell few times, he was initially diagnosed with lumbar spinal stenosis, had lumbar spine disc decompression surgery, which fail to improve his symptoms, he continued to have worsening gait difficulty, this eventually led to hospital admission at [facility], the diagnosis of MS was based on abnormal MRI findings, spinal fluid testing, after rehabilitation, he was able to ambulate again with a cane, there was no visual loss, he was started treatment with Avonex since 1996, he moved to [place] in 1999, was under the care of Dr. Delphia Grates at [facility], was treated with Betaseron for a short period of time, but he could not tolerate it due to injection side effect, went back on Avelox shortly afterwards  over the years, he denies significant relapsing episode, he had slow decline in his walking, has been ambulate with crutches, exercise regularly  He had a history of lumbar decompression surgery  in 1996 at [place] with very difficult recovery, he was in wheelchair for 6 months because of gait difficulty.  He presented to the hospital in July 2013 for worsening gait difficulty, had extensive imaging study.  I have personally reviewed MRI of the brain in 2013 showed chronic changes, cervical spine with significant cord compression at C4-5 C5-6, but no abnormal cord signal to suggest cervical MS lesions, MRI of thoracic spine showed chronic cord  compression and myelomalacia at T10-11, he was evaluated by neurosurgeon, Dr. Ellene Route, the plan was to treat him with short course of steroid, followed by thoracic elective decompression, he has made significant improvement with steroid treatment, has lost follow-up,  Over the past 3 years, he had worsening gait difficulty, he is able to walk with crutches, but with worsening bending forward posture and difficulty, recent few months, he also noticed bilateral hands paresthesia. he lives in his own apartment, has 12 steps,  he denies significant low back pain, no bowel and bladder incontinence, he exercise 3 times each week,  UPDATE April 6th 2017: He was evaluated by Dr. Ellene Route in early March 2017, per patient, he was offered decompression surgery for cervical and thoracic stenosis, he also had MRI of lumbar He is now complaining of worsening right low back pain, radiating pain to right hip and right thigh,   He also began to notice bilateral hands numbness weakness since 2003, gradually getting worse, is very difficult for him to button his shirt  I have personally reviewed MRI of lumbar in March 2017: Multilevel degenerative changes, most severe at L4-5 multifactorial severe spinal stenosis lateral recess stenosis. Marked foraminal narrowing greater on the right  UPDATE Mar 03 2016:  He had L4-5 decompressive laminectomy, decompression of  L4-5 nerve roots, posterior lumbar interbody arthrodeses with autograft and allograft by Dr. Ellene Route on December 30 2015, he responded very well, he no longer has significant right low back pain radiating to his right hip, his posture has improved, he can move better,  However since surgery, he noticed increasing incident of bowel incontinence, couple times each  week, he denies significant problem with his bladder  We again reviewed presurgical MRI in March 2017, multilevel lumbar degenerative disc disease, L4-5 severe spinal stenosis, lateral recess stenosis,  marked foraminal narrowing greater on the right, MRI of thoracic spine moderate to severe spinal stenosis at T10-11, severe bilateral foraminal stenosis, T2 hyper intensity cord signal, facet hypertrophy at T9-10, with severe bilateral foraminal stenosis, MRI of cervical spine, Mild spinal canal stenosis severe bilateral foraminal stenosis at C4-5, C5-6 and C6-7   UPDATE Mar 09 2017: He has no low back pain, bu he complains of feet cold all the time. He has regular bowel movement. Still has occasionally bowel incontinence, urinary urgency, was seen by urologist in the past  UPDATE Mar 22 2018: He has more difficulty walking, weaker in his hands, he could not lift his leg against gravity any more, his bowel and bladder issue has much improved,   He is going to have manual wheelchair soon.  UPDATE Jun 23 2018: He came in today consider worsening bilateral lower extremity weakness, increased difficulty ambulating using his upper body strength to drag bilateral lower extremity weight,  We again personally reviewed MRIs in 2017, MRI of lumbar showed severe spinal stenosis L4-5, with marked right foraminal narrowing, he had underwent lumbar decompression surgery since,  MRI of thoracic spine T10-11 multifactorial prominent spinal stenosis and cord flattening, chronic increased signal, consistent with myelomalacia,  MRI of cervical spine, multilevel degenerative changes, with mild to moderate canal stenosis, multilevel severe foraminal narrowing, especially C7-T1  REVIEW OF SYSTEMS: Out of a complete 14 system review of symptoms, the patient complains only of the following symptoms, and all other reviewed systems are negative.  As above  ALLERGIES: Allergies  Allergen Reactions  . No Known Allergies     HOME MEDICATIONS: Outpatient Medications Prior to Visit  Medication Sig Dispense Refill  . aspirin 81 MG tablet Take 81 mg by mouth daily.    . baclofen (LIORESAL) 20 MG tablet Take 1 tablet  (20 mg total) by mouth 3 (three) times daily. 270 tablet 3  . Blood Glucose Calibration (ACCU-CHEK AVIVA) SOLN     . ciprofloxacin (CIPRO) 500 MG tablet Take 1 tablet (500 mg total) by mouth 2 (two) times daily. 20 tablet 0  . cloNIDine (CATAPRES) 0.1 MG tablet Take 1 tablet (0.1 mg total) by mouth 2 (two) times daily. 180 tablet 3  . Cyanocobalamin (VITAMIN B-12 PO) Take 1 tablet by mouth 2 (two) times daily. Patient is unsure of dose    . ferrous sulfate 325 (65 FE) MG tablet Take 1 tablet (325 mg total) by mouth daily with breakfast. 90 tablet 3  . finasteride (PROSCAR) 5 MG tablet Take 1 tablet (5 mg total) by mouth daily. 90 tablet 3  . glucose blood (ACCU-CHEK AVIVA PLUS) test strip TEST ONCE DAILY (AM fasting). E11.8 100 each 3  . Incontinence Supply Disposable (DEPEND ADJUSTABLE UNDERWEAR LG) MISC Use as needed. 30 each 12  . interferon beta-1a (AVONEX) 30 MCG injection Inject 30 mcg into the muscle every 7 (seven) days.    . Lancets Misc. (ACCU-CHEK FASTCLIX LANCET) KIT 1 Units by Does not apply route daily before breakfast. 1 kit 11  . meloxicam (MOBIC) 15 MG tablet Take 1 tablet (15 mg total) by mouth daily as needed. for pain 90 tablet 1  . metFORMIN (GLUCOPHAGE) 1000 MG tablet Take 1 tablet (1,000 mg total) by mouth 2 (two) times daily with a meal. 180 tablet 3  .  Multiple Vitamin (MULTIVITAMIN) tablet Take 1 tablet by mouth daily.    . Olmesartan-amLODIPine-HCTZ 40-10-12.5 MG TABS Take 1 tablet by mouth daily. 90 tablet 3  . omeprazole (PRILOSEC) 20 MG capsule Take 1 capsule (20 mg total) by mouth daily. 90 capsule 3  . oxybutynin (DITROPAN-XL) 10 MG 24 hr tablet Take 1 tablet (10 mg total) by mouth at bedtime. 90 tablet 1  . pioglitazone (ACTOS) 30 MG tablet Take 1 tablet (30 mg total) by mouth daily. 90 tablet 3  . pregabalin (LYRICA) 75 MG capsule Take 1 capsule (75 mg total) by mouth 2 (two) times daily. 180 capsule 1  . tamsulosin (FLOMAX) 0.4 MG CAPS capsule TAKE 1 CAPSULE BY  MOUTH  DAILY 90 capsule 3  . traMADol (ULTRAM) 50 MG tablet Take 1 tablet (50 mg total) by mouth at bedtime as needed for severe pain. for pain 30 tablet 5  . traZODone (DESYREL) 50 MG tablet TAKE 1 TABLET BY MOUTH AT  BEDTIME 90 tablet 3   No facility-administered medications prior to visit.     PAST MEDICAL HISTORY: Past Medical History:  Diagnosis Date  . Abnormality of gait   . Acute delirium 12/11/2011  . CTS (carpal tunnel syndrome) 09/26/2015  . Diabetes mellitus   . Hypercholesterolemia   . Hypertension   . Kidney stones   . Leukocytopenia 05/22/2013  . MS (multiple sclerosis) [facility])    sees [facility]  . 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 BACK SURGERY    . COLONOSCOPY    . LUMBAR LAMINECTOMY      FAMILY HISTORY: Family History  Problem Relation Age of Onset  . Hypertension Mother   . Liver cancer Father   . Hypertension Sister     SOCIAL HISTORY: Social History   Socioeconomic History  . Marital status: Legally Separated    Spouse name: Not on file  . Number of children: 1  . Years of education: 72  . Highest education level: Not on file  Occupational History  . Occupation: Disability  Social Needs  . Financial resource strain: Not on file  . Food insecurity:    Worry: Not on file    Inability: Not on file  . Transportation needs:    Medical: Not on file    Non-medical: Not on file  Tobacco Use  . Smoking status: Never Smoker  . Smokeless tobacco: Never Used  Substance and Sexual Activity  . Alcohol use: No  . Drug use: No  . Sexual activity: Not Currently  Lifestyle  . Physical activity:    Days per week: Not on file    Minutes per session: Not on file  . Stress: Not on file  Relationships  . Social connections:    Talks on phone: Not on file    Gets together: Not on file    Attends religious  service: Not on file    Active member of club or organization: Not on file    Attends meetings of clubs or organizations: Not on file    Relationship status: Not on file  . Intimate partner violence:    Fear of current or ex partner: Not on file    Emotionally abused: Not on file    Physically abused: Not on file    Forced sexual activity: Not on file  Other Topics Concern  .  Not on file  Social History Narrative   Work or [place] alone, feels safe      Spiritual Beliefs: Christian      Lifestyle: works out with friend 3 days per week at [employer]; diet is poor - likes to [employer] but like unhealthy options      Right-handed.    2 cups caffeine per day.   PHYSICAL EXAM  Vitals:   06/23/18 0818  BP: (!) 144/80  Pulse: 96  Weight: 173 lb 8 oz (78.7 kg)  Height: '5\' 7"'$  (1.702 m)   Body mass index is 27.17 kg/m.   PHYSICAL EXAMNIATION:  Gen: NAD, conversant, well nourised, obese, well groomed                     Cardiovascular: Regular rate rhythm, no peripheral edema, warm, nontender. Eyes: Conjunctivae clear without exudates or hemorrhage Neck: Supple, no carotid bruits. Pulmonary: Clear to auscultation bilaterally   NEUROLOGICAL EXAM:  MENTAL STATUS: Speech:    Speech is normal; fluent and spontaneous with normal comprehension.  Cognition:     Orientation to time, place and person     Normal recent and remote memory     Normal Attention span and concentration     Normal Language, naming, repeating,spontaneous speech     Fund of knowledge   CRANIAL NERVES: CN II: Visual fields are full to confrontation.Pupils are round equal and briskly reactive to light. CN III, IV, VI: extraocular movement are normal. No ptosis. CN V: Facial sensation is intact to pinprick in all 3 divisions bilaterally. Corneal responses are intact.  CN VII: Face is symmetric with normal eye closure and smile. CN VIII: Hearing is normal to rubbing fingers CN IX, X:  Palate elevates symmetrically. Phonation is normal. CN XI: Head turning and shoulder shrug are intact CN XII: Tongue is midline with normal movements and no atrophy.  MOTOR: Bilateral hand intrinsic muscle atrophy, especially bilateral abductor pollicis brevis, mild bilateral grip, finger abduction weakness, no significant weakness at bilateral upper extremity proximal muscles.   He has prominent bilateral lower extremity muscle weakness, no significant spasticity, hip flexion (R/L) 1/1, knee extension 3/3 knee flexion 3/3, ankle dorsiflexion 3//3, ankle plantar flexion 1/1.  REFLEXES: Reflexes are 1 and symmetric at the biceps, triceps,3/3 knees, and absent at ankles.    SENSORY: Decreased vibratory sensation bilateral ankle  COORDINATION: Rapid alternating movements and fine finger movements are intact.   GAIT/STANCE: He needs 2 crutches to ambulate, leaning forward.   DIAGNOSTIC DATA (LABS, IMAGING, TESTING) - I reviewed patient records, labs, notes, testing and imaging myself where available.  Lab Results  Component Value Date   WBC 3.1 (L) 02/09/2018   HGB 11.9 (L) 02/09/2018   HCT 35.2 (L) 02/09/2018   MCV 92.9 02/09/2018   PLT 286.0 02/09/2018      Component Value Date/Time   NA 142 06/10/2018 1122   NA 144 01/04/2014 0943   K 4.3 06/10/2018 1122   K 3.8 01/04/2014 0943   CL 104 06/10/2018 1122   CO2 29 06/10/2018 1122   CO2 28 01/04/2014 0943   GLUCOSE 113 (H) 06/10/2018 1122   GLUCOSE 113 01/04/2014 0943   BUN 28 (H) 06/10/2018 1122   BUN 18.1 01/04/2014 0943   CREATININE 1.11 06/10/2018 1122   CREATININE 1.0 01/04/2014 0943   CALCIUM 10.1 06/10/2018 1122   CALCIUM 10.2 01/04/2014 0943   PROT 6.7 09/10/2017 0934  PROT 7.4 01/04/2014 0943   ALBUMIN 4.4 09/10/2017 0934   ALBUMIN 4.6 01/04/2014 0943   AST 23 09/10/2017 0934   AST 30 01/04/2014 0943   ALT 14 09/10/2017 0934   ALT 17 01/04/2014 0943   ALKPHOS 70 09/10/2017 0934   ALKPHOS 64 01/04/2014  0943   BILITOT 0.3 09/10/2017 0934   BILITOT 0.41 01/04/2014 0943   GFRNONAA >60 01/03/2016 0517   GFRNONAA >89 12/25/2011 1132   GFRAA >60 01/03/2016 0517   GFRAA >89 12/25/2011 1132   Lab Results  Component Value Date   CHOL 169 06/10/2018   HDL 51.90 06/10/2018   LDLCALC 101 (H) 06/10/2018   TRIG 81.0 06/10/2018   CHOLHDL 3 06/10/2018   Lab Results  Component Value Date   HGBA1C 6.1 06/10/2018   Lab Results  Component Value Date   VITAMINB12 748 01/01/2016   Lab Results  Component Value Date   TSH 0.63 09/10/2017      ASSESSMENT AND PLAN 68 y.o. year old male   Probable multiple sclerosis Gait abnormality Worsening weakness of bilateral lower extremity  Has been stable on Avonex for many years, multiple discussion in the past, suggest give a trial of stopping Avonex, he has been using Avonex without helping his symptoms,     His gait difficulty are combination of cervical myelopathy, radiculopathy, thoracic myelopathy, previous lumbar radiculopathy, status post decompression surgery, severe bilateral carpal tunnel syndromes.  Will repeat MRI of neuraxis  Laboratory evaluations    Marcial Pacas, M.D. Ph.D.  [facility] [place], [place] [address] Phone: [phone] Fax:      [phone]

## 2018-06-24 ENCOUNTER — Telehealth: Payer: Self-pay | Admitting: Neurology

## 2018-06-24 LAB — CBC
HEMOGLOBIN: 11.1 g/dL — AB (ref 13.0–17.7)
Hematocrit: 32.1 % — ABNORMAL LOW (ref 37.5–51.0)
MCH: 32.5 pg (ref 26.6–33.0)
MCHC: 34.6 g/dL (ref 31.5–35.7)
MCV: 94 fL (ref 79–97)
Platelets: 241 10*3/uL (ref 150–450)
RBC: 3.42 x10E6/uL — ABNORMAL LOW (ref 4.14–5.80)
RDW: 14.2 % (ref 11.6–15.4)
WBC: 3.3 10*3/uL — ABNORMAL LOW (ref 3.4–10.8)

## 2018-06-24 LAB — VITAMIN B12: Vitamin B-12: 375 pg/mL (ref 232–1245)

## 2018-06-24 LAB — TSH: TSH: 1.76 u[IU]/mL (ref 0.450–4.500)

## 2018-06-24 NOTE — Telephone Encounter (Signed)
Please call patient, laboratory evaluation showed mild anemia with hemoglobin of 11.1, mildly low WBC at 3003, which is at his baseline.  Rest of laboratory evaluation showed no significant abnormality.

## 2018-06-24 NOTE — Telephone Encounter (Signed)
Spoke to patient - he is aware of his lab results and verbalized understanding.  States his PCP just started him on an iron supplement a few weeks ago.

## 2018-07-01 ENCOUNTER — Ambulatory Visit
Admission: RE | Admit: 2018-07-01 | Discharge: 2018-07-01 | Disposition: A | Discharge status: Home / Self Care | Payer: Medicare Other | Source: Ambulatory Visit | Attending: Neurology | Admitting: Neurology

## 2018-07-01 DIAGNOSIS — M6281 Muscle weakness (generalized): Secondary | ICD-10-CM

## 2018-07-01 DIAGNOSIS — R6889 Other general symptoms and signs: Secondary | ICD-10-CM | POA: Diagnosis not present

## 2018-07-04 ENCOUNTER — Telehealth: Payer: Self-pay | Admitting: Neurology

## 2018-07-04 NOTE — Telephone Encounter (Signed)
Please call patient, MRI of the brain showed mild supratentorium small vessel disease, no significant change compared to previous MRI in July 2013  MRI of cervical spine also showed multilevel degenerative changes, moderate spinal stenosis at C4-5, C5-6, C6-7, mild progression compared to previous study, no evidence of spinal cord compression, variable degree of foraminal narrowing, most severe at C6-7, C7-T1, potential left C7 and bilateral C8 nerve root compression, but this will not explain his worsening gait abnormality   IMPRESSION: This MRI of the brain without contrast shows the following: 1.  Multiple T2/flair hyperintense foci predominantly in the subcortical and deep white matter of the hemispheres.  A few foci are also noted in the periventricular white matter though none are in the septal callosal fibers.  Compared to the MRI dated 12/11/2011, there is no significant change.  These most likely represent chronic microvascular ischemic changes.  Demyelination would be less likely with this pattern. 2.   There are no acute findings.    IMPRESSION: This MRI of the cervical spine without contrast shows the following: 1.    Mild to moderate degenerative changes at C2-C3 that does not lead to spinal stenosis or nerve root compression. 2.    Moderate spinal stenosis at C4-C5, C5-C6 and C6-C7 due to degenerative changes.  The extent of spinal stenosis has mildly progressed when compared to the 2017 MRI at these levels.  At C6-C7, there is potential for left C7 nerve root compression. 3.    Mild spinal stenosis at C7-T1 with moderately severe bilateral foraminal narrowing that could lead to compression of either the C8 nerve roots. 4.    The spinal cord appears normal.

## 2018-07-04 NOTE — Telephone Encounter (Signed)
He is aware of his brain and cervical MRI results.  He has a pending appt for MRI scans of his thoracic and lumbar spine.  He is aware we will call him with those results, once available.

## 2018-07-05 DIAGNOSIS — G35 Multiple sclerosis: Secondary | ICD-10-CM | POA: Diagnosis not present

## 2018-07-08 ENCOUNTER — Ambulatory Visit
Admission: RE | Admit: 2018-07-08 | Discharge: 2018-07-08 | Disposition: A | Discharge status: Home / Self Care | Payer: Medicare Other | Source: Ambulatory Visit | Attending: Neurology | Admitting: Neurology

## 2018-07-08 DIAGNOSIS — M6281 Muscle weakness (generalized): Secondary | ICD-10-CM

## 2018-07-13 ENCOUNTER — Telehealth: Payer: Self-pay | Admitting: Neurology

## 2018-07-13 NOTE — Telephone Encounter (Signed)
Please call patient, MRI lumbar showed worsening L3-4 spinal stenosis compared to previous scan in 2017, he had surgery at L4-5 in the past.  Also continued evidence of multilevel thoracic spinal stenosis, abnormal spinal cord signal to T10-11 level,  MRI of cervical spine showed moderate spinal stenosis C4-5, 5 6, C6 and 7, slight progression compared to previous scan in 2017,  MRI of the brain showed no significant changes,  If he wished to review to film,  may give him a follow-up visit,   IMPRESSION: This MRI of the lumbar spine shows the following: 1.    At L2-L3, there is mild stable spinal stenosis but no nerve root compression. 2.    At L3-L4, there is moderately severe spinal stenosis and severe right lateral recess stenosis causing probable right L4 nerve root compression.  This level has markedly progressed when compared to the 2017 MRI. 3.    PLIF at L4-L5 that has occurred since the 08/26/2015 MRI.  There is decompression that has resolved the spinal stenosis and foraminal narrowing present on the previous MRI. 4.    Fusion at L5-S1.  This could be congenital or degenerative and appears unchanged compared to the 2017 MRI.  IMPRESSION: This MRI of the thoracic spine without contrast shows the following: 1.   At T1-T2, there is possible right T1 nerve root compression 2.   At T2-T3, there is possible bilateral T2 nerve root compression. 3.   At T8-T9, there are severe degenerative changes more to the left that could lead to left T8 nerve root compression. 4.   At T10-T11 there is moderate spinal stenosis and bilateral severe foraminal narrowing with probable bilateral T10 nerve root compression. 5.   At T11-T12, there are severe degenerative changes.  The central canal is narrowed but not enough to be considered spinal stenosis.  There is possible right T11 nerve root compression. 6.   Degenerative changes at other levels as detailed above do not lead to spinal stenosis or nerve root  compression. 7.   Fusion, likely congenital, of the T7 and T8 vertebral bodies. 8.   Abnormal spinal cord signal adjacent to T10-T11, towards the left, and evidence of T9-T11 decompressive surgery in the past, also present on the 2013 MRI.  IMPRESSION: This MRI of the cervical spine without contrast shows the following: 1.    Mild to moderate degenerative changes at C2-C3 that does not lead to spinal stenosis or nerve root compression. 2.    Moderate spinal stenosis at C4-C5, C5-C6 and C6-C7 due to degenerative changes.  The extent of spinal stenosis has mildly progressed when compared to the 2017 MRI at these levels.  At C6-C7, there is potential for left C7 nerve root compression. 3.    Mild spinal stenosis at C7-T1 with moderately severe bilateral foraminal narrowing that could lead to compression of either the C8 nerve roots. 4.    The spinal cord appears normal.  IMPRESSION: This MRI of the brain without contrast shows the following: 1.  Multiple T2/flair hyperintense foci predominantly in the subcortical and deep white matter of the hemispheres.  A few foci are also noted in the periventricular white matter though none are in the septal callosal fibers.  Compared to the MRI dated 12/11/2011, there is no significant change.  These most likely represent chronic microvascular ischemic changes.  Demyelination would be less likely with this pattern. 2.   There are no acute findings.

## 2018-07-13 NOTE — Telephone Encounter (Signed)
Spoke to patient and notified him of his MRI results.  He would like to come in for further discussion/review with Dr. Krista Blue.  He scheduled an appt on 08/08/2018.

## 2018-08-03 DIAGNOSIS — M4316 Spondylolisthesis, lumbar region: Secondary | ICD-10-CM | POA: Diagnosis not present

## 2018-08-03 DIAGNOSIS — G8220 Paraplegia, unspecified: Secondary | ICD-10-CM | POA: Diagnosis not present

## 2018-08-03 DIAGNOSIS — G35 Multiple sclerosis: Secondary | ICD-10-CM | POA: Diagnosis not present

## 2018-08-03 DIAGNOSIS — M4802 Spinal stenosis, cervical region: Secondary | ICD-10-CM | POA: Diagnosis not present

## 2018-08-08 ENCOUNTER — Encounter: Payer: Self-pay | Admitting: Neurology

## 2018-08-08 ENCOUNTER — Ambulatory Visit (INDEPENDENT_AMBULATORY_CARE_PROVIDER_SITE_OTHER): Payer: Medicare Other | Admitting: Neurology

## 2018-08-08 VITALS — BP 161/82 | HR 97 | Ht 67.0 in | Wt 172.0 lb

## 2018-08-08 DIAGNOSIS — R269 Unspecified abnormalities of gait and mobility: Secondary | ICD-10-CM

## 2018-08-08 DIAGNOSIS — R531 Weakness: Secondary | ICD-10-CM | POA: Diagnosis not present

## 2018-08-08 DIAGNOSIS — M4800 Spinal stenosis, site unspecified: Secondary | ICD-10-CM | POA: Insufficient documentation

## 2018-08-08 NOTE — Patient Instructions (Signed)
[facility]  [phone]

## 2018-08-08 NOTE — Progress Notes (Signed)
HISTORY OF PRESENT ILLNESS: Larry [name] is a 68 -year-old male, accompanied by her daughter [name], seen in refer by his primary care physician Dr. Rodena Goldmann for evaluation of worsening gait difficulty, multiple sclerosis, initial evaluation was on June 12 2015.  He was a patient of our office in the past, last clinical visit was July 2013, he had a history of diabetes since 2002, also with  diagnosis of relapsing remitting multiple sclerosis, initial presentation was gait difficulty in 1996, he fell few times, he was initially diagnosed with lumbar spinal stenosis, had lumbar spine disc decompression surgery, which fail to improve his symptoms, he continued to have worsening gait difficulty, this eventually led to hospital admission at [facility], the diagnosis of MS was based on abnormal MRI findings, spinal fluid testing, after rehabilitation, he was able to ambulate again with a cane, there was no visual loss, he was started treatment with Avonex since 1996, he moved to [place] in 1999, was under the care of Dr. Delphia Grates at [facility], was treated with Betaseron for a short period of time, but he could not tolerate it due to injection side effect, went back on Avelox shortly afterwards  over the years, he denies significant relapsing episode, he had slow decline in his walking, has been ambulate with crutches, exercise regularly  He had a history of lumbar decompression surgery  in 1996 at [place] with very difficult recovery, he was in wheelchair for 6 months because of gait difficulty.  He presented to the hospital in July 2013 for worsening gait difficulty, had extensive imaging study.  I have personally reviewed MRI of the brain in 2013 showed chronic changes, cervical spine with significant cord compression at C4-5 C5-6, but no abnormal cord signal to suggest cervical MS lesions, MRI of thoracic spine showed chronic cord  compression and myelomalacia at T10-11, he was evaluated by neurosurgeon, Dr. Ellene Route, the plan was to treat him with short course of steroid, followed by thoracic elective decompression, he has made significant improvement with steroid treatment, has lost follow-up,  Over the past 3 years, he had worsening gait difficulty, he is able to walk with crutches, but with worsening bending forward posture and difficulty, recent few months, he also noticed bilateral hands paresthesia. he lives in his own apartment, has 12 steps,  he denies significant low back pain, no bowel and bladder incontinence, he exercise 3 times each week,  UPDATE April 6th 2017: He was evaluated by Dr. Ellene Route in early March 2017, per patient, he was offered decompression surgery for cervical and thoracic stenosis, he also had MRI of lumbar He is now complaining of worsening right low back pain, radiating pain to right hip and right thigh,   He also began to notice bilateral hands numbness weakness since 2003, gradually getting worse, is very difficult for him to button his shirt  I have personally reviewed MRI of lumbar in March 2017: Multilevel degenerative changes, most severe at L4-5 multifactorial severe spinal stenosis lateral recess stenosis. Marked foraminal narrowing greater on the right  UPDATE Mar 03 2016:  He had L4-5 decompressive laminectomy, decompression of  L4-5 nerve roots, posterior lumbar interbody arthrodeses with autograft and allograft by Dr. Ellene Route on December 30 2015, he responded very well, he no longer has significant right low back pain radiating to his right hip, his posture has improved, he can move better,  However since surgery, he noticed increasing incident of bowel incontinence, couple times each  week, he denies significant problem with his bladder  We again reviewed presurgical MRI in March 2017, multilevel lumbar degenerative disc disease, L4-5 severe spinal stenosis, lateral recess stenosis,  marked foraminal narrowing greater on the right, MRI of thoracic spine moderate to severe spinal stenosis at T10-11, severe bilateral foraminal stenosis, T2 hyper intensity cord signal, facet hypertrophy at T9-10, with severe bilateral foraminal stenosis, MRI of cervical spine, Mild spinal canal stenosis severe bilateral foraminal stenosis at C4-5, C5-6 and C6-7   UPDATE Mar 09 2017: He has no low back pain, bu he complains of feet cold all the time. He has regular bowel movement. Still has occasionally bowel incontinence, urinary urgency, was seen by urologist in the past  UPDATE Mar 22 2018: He has more difficulty walking, weaker in his hands, he could not lift his leg against gravity any more, his bowel and bladder issue has much improved,   He is going to have manual wheelchair soon.  UPDATE Jun 23 2018: He came in today consider worsening bilateral lower extremity weakness, increased difficulty ambulating using his upper body strength to drag bilateral lower extremity weight,  We again personally reviewed MRIs in 2017, MRI of lumbar showed severe spinal stenosis L4-5, with marked right foraminal narrowing, he had underwent lumbar decompression surgery since,  MRI of thoracic spine T10-11 multifactorial prominent spinal stenosis and cord flattening, chronic increased signal, consistent with myelomalacia,  MRI of cervical spine, multilevel degenerative changes, with mild to moderate canal stenosis, multilevel severe foraminal narrowing, especially C7-T1  UPDATE August 08 2018: We have personally reviewed the repeat MRIs of neuroaxis, MRI of brain in Jan 2020: Multiple T2/flair hyperintensity in subcortical white matter, less likely it is account for his worsening gait abnormality, most likely represent chronic microvascular ischemic changes, less likely to be demyelinating disease.  MRI of cervical spine showed moderate spinal stenosis at C4-5, C5-6, C6-7, due to degenerative changes, the  extent of spinal stenosis has mildly progressed compared to 2017, potential nerve root compression at left C7  MRI of thoracic spine showed moderate spinal stenosis at T10-11, bilateral severe foraminal narrowing with probable bilateral T10 nerve root compression,  MRI of lumbar moderately severe spinal stenosis, at L3-4, severe right lateral recess stenosis, PLIF at L4-5,  He has profound bilateral lower extremity weakness, proximal more than distal, hyperreflexia of bilateral upper extremity, bilateral hand intrinsic muscle atrophy, relatively preserved bilateral upper extremity proximal muscle weakness, above findings suggest that his thoracic, cervical spinal stenosis likely contributed to his progressive worsening bilateral lower extremity weakness, gait abnormality.  Laboratory evaluation showed normal B12, TSH, CBC showed anemia with hemoglobin of 11.1  REVIEW OF SYSTEMS: Out of a complete 14 system review of symptoms, the patient complains only of the following symptoms, and all other reviewed systems are negative.  As above  ALLERGIES: Allergies  Allergen Reactions  . No Known Allergies     HOME MEDICATIONS: Outpatient Medications Prior to Visit  Medication Sig Dispense Refill  . aspirin 81 MG tablet Take 81 mg by mouth daily.    . baclofen (LIORESAL) 20 MG tablet Take 1 tablet (20 mg total) by mouth 3 (three) times daily. 270 tablet 3  . Blood Glucose Calibration (ACCU-CHEK AVIVA) SOLN     . ciprofloxacin (CIPRO) 500 MG tablet Take 1 tablet (500 mg total) by mouth 2 (two) times daily. 20 tablet 0  . cloNIDine (CATAPRES) 0.1 MG tablet Take 1 tablet (0.1 mg total) by mouth 2 (two) times daily. 180 tablet  3  . Cyanocobalamin (VITAMIN B-12 PO) Take 1 tablet by mouth 2 (two) times daily. Patient is unsure of dose    . ferrous sulfate 325 (65 FE) MG tablet Take 1 tablet (325 mg total) by mouth daily with breakfast. 90 tablet 3  . finasteride (PROSCAR) 5 MG tablet Take 1 tablet (5 mg  total) by mouth daily. 90 tablet 3  . glucose blood (ACCU-CHEK AVIVA PLUS) test strip TEST ONCE DAILY (AM fasting). E11.8 100 each 3  . Incontinence Supply Disposable (DEPEND ADJUSTABLE UNDERWEAR LG) MISC Use as needed. 30 each 12  . interferon beta-1a (AVONEX) 30 MCG injection Inject 30 mcg into the muscle every 7 (seven) days.    . Lancets Misc. (ACCU-CHEK FASTCLIX LANCET) KIT 1 Units by Does not apply route daily before breakfast. 1 kit 11  . meloxicam (MOBIC) 15 MG tablet Take 1 tablet (15 mg total) by mouth daily as needed. for pain 90 tablet 1  . metFORMIN (GLUCOPHAGE) 1000 MG tablet Take 1 tablet (1,000 mg total) by mouth 2 (two) times daily with a meal. 180 tablet 3  . Multiple Vitamin (MULTIVITAMIN) tablet Take 1 tablet by mouth daily.    . Olmesartan-amLODIPine-HCTZ 40-10-12.5 MG TABS Take 1 tablet by mouth daily. 90 tablet 3  . omeprazole (PRILOSEC) 20 MG capsule Take 1 capsule (20 mg total) by mouth daily. 90 capsule 3  . oxybutynin (DITROPAN-XL) 10 MG 24 hr tablet Take 1 tablet (10 mg total) by mouth at bedtime. 90 tablet 1  . pioglitazone (ACTOS) 30 MG tablet Take 1 tablet (30 mg total) by mouth daily. 90 tablet 3  . pregabalin (LYRICA) 75 MG capsule Take 1 capsule (75 mg total) by mouth 2 (two) times daily. 180 capsule 1  . tamsulosin (FLOMAX) 0.4 MG CAPS capsule TAKE 1 CAPSULE BY MOUTH  DAILY 90 capsule 3  . traMADol (ULTRAM) 50 MG tablet Take 1 tablet (50 mg total) by mouth at bedtime as needed for severe pain. for pain 30 tablet 5  . traZODone (DESYREL) 50 MG tablet TAKE 1 TABLET BY MOUTH AT  BEDTIME 90 tablet 3   No facility-administered medications prior to visit.     PAST MEDICAL HISTORY: Past Medical History:  Diagnosis Date  . Abnormality of gait   . Acute delirium 12/11/2011  . CTS (carpal tunnel syndrome) 09/26/2015  . Diabetes mellitus   . Hypercholesterolemia   . Hypertension   . Kidney stones   . Leukocytopenia 05/22/2013  . MS (multiple sclerosis) [facility])     sees [facility]  . T10 spinal cord injury ([place]) 12/12/2011   hx surgery 1996 with bilat gait abnormality since    PAST SURGICAL HISTORY: Past Surgical History:  Procedure Laterality Date  . BACK SURGERY    . COLONOSCOPY    . LUMBAR LAMINECTOMY      FAMILY HISTORY: Family History  Problem Relation Age of Onset  . Hypertension Mother   . Liver cancer Father   . Hypertension Sister     SOCIAL HISTORY: Social History   Socioeconomic History  . Marital status: Legally Separated    Spouse name: Not on file  . Number of children: 1  . Years of education: 3  . Highest education level: Not on file  Occupational History  . Occupation: Disability  Social Needs  . Financial resource strain: Not on file  . Food insecurity:    Worry: Not on file    Inability: Not on file  . Transportation needs:  Medical: Not on file    Non-medical: Not on file  Tobacco Use  . Smoking status: Never Smoker  . Smokeless tobacco: Never Used  Substance and Sexual Activity  . Alcohol use: No  . Drug use: No  . Sexual activity: Not Currently  Lifestyle  . Physical activity:    Days per week: Not on file    Minutes per session: Not on file  . Stress: Not on file  Relationships  . Social connections:    Talks on phone: Not on file    Gets together: Not on file    Attends religious service: Not on file    Active member of club or organization: Not on file    Attends meetings of clubs or organizations: Not on file    Relationship status: Not on file  . Intimate partner violence:    Fear of current or ex partner: Not on file    Emotionally abused: Not on file    Physically abused: Not on file    Forced sexual activity: Not on file  Other Topics Concern  . Not on file  Social History Narrative   Work or [place] alone, feels safe      Spiritual Beliefs: Christian      Lifestyle: works out with friend 3 days per week at [employer]; diet is poor - likes  to [employer] but like unhealthy options      Right-handed.    2 cups caffeine per day.   PHYSICAL EXAM  Vitals:   08/08/18 1059  BP: (!) 161/82  Pulse: 97  Weight: 172 lb (78 kg)  Height: '5\' 7"'$  (1.702 m)   Body mass index is 26.94 kg/m.   PHYSICAL EXAMNIATION:  Gen: NAD, conversant, well nourised, obese, well groomed                     Cardiovascular: Regular rate rhythm, no peripheral edema, warm, nontender. Eyes: Conjunctivae clear without exudates or hemorrhage Neck: Supple, no carotid bruits. Pulmonary: Clear to auscultation bilaterally   NEUROLOGICAL EXAM:  MENTAL STATUS: Speech:    Speech is normal; fluent and spontaneous with normal comprehension.  Cognition:     Orientation to time, place and person     Normal recent and remote memory     Normal Attention span and concentration     Normal Language, naming, repeating,spontaneous speech     Fund of knowledge   CRANIAL NERVES: CN II: Visual fields are full to confrontation.Pupils are round equal and briskly reactive to light. CN III, IV, VI: extraocular movement are normal. No ptosis. CN V: Facial sensation is intact to pinprick in all 3 divisions bilaterally. Corneal responses are intact.  CN VII: Face is symmetric with normal eye closure and smile. CN VIII: Hearing is normal to rubbing fingers CN IX, X: Palate elevates symmetrically. Phonation is normal. CN XI: Head turning and shoulder shrug are intact CN XII: Tongue is midline with normal movements and no atrophy.  MOTOR: Bilateral hand intrinsic muscle atrophy, especially bilateral abductor pollicis brevis, mild bilateral grip, finger abduction weakness, no significant weakness at bilateral upper extremity proximal muscles.   He has prominent bilateral lower extremity muscle weakness, no significant spasticity, hip flexion (R/L) 1/1, knee extension 3/3 knee flexion 3/3, ankle dorsiflexion 3//3, ankle plantar flexion 1/1.  REFLEXES: Reflexes are 1 and  symmetric at the biceps, triceps,3/3 knees, and absent at ankles.    SENSORY: Decreased vibratory  sensation distal shin level  COORDINATION: Rapid alternating movements and fine finger movements are intact.   GAIT/STANCE: He needs 2 crutches to get up from seated position, leaning forward dragging both legs   DIAGNOSTIC DATA (LABS, IMAGING, TESTING) - I reviewed patient records, labs, notes, testing and imaging myself where available.  Lab Results  Component Value Date   WBC 3.3 (L) 06/23/2018   HGB 11.1 (L) 06/23/2018   HCT 32.1 (L) 06/23/2018   MCV 94 06/23/2018   PLT 241 06/23/2018      Component Value Date/Time   NA 142 06/10/2018 1122   NA 144 01/04/2014 0943   K 4.3 06/10/2018 1122   K 3.8 01/04/2014 0943   CL 104 06/10/2018 1122   CO2 29 06/10/2018 1122   CO2 28 01/04/2014 0943   GLUCOSE 113 (H) 06/10/2018 1122   GLUCOSE 113 01/04/2014 0943   BUN 28 (H) 06/10/2018 1122   BUN 18.1 01/04/2014 0943   CREATININE 1.11 06/10/2018 1122   CREATININE 1.0 01/04/2014 0943   CALCIUM 10.1 06/10/2018 1122   CALCIUM 10.2 01/04/2014 0943   PROT 6.7 09/10/2017 0934   PROT 7.4 01/04/2014 0943   ALBUMIN 4.4 09/10/2017 0934   ALBUMIN 4.6 01/04/2014 0943   AST 23 09/10/2017 0934   AST 30 01/04/2014 0943   ALT 14 09/10/2017 0934   ALT 17 01/04/2014 0943   ALKPHOS 70 09/10/2017 0934   ALKPHOS 64 01/04/2014 0943   BILITOT 0.3 09/10/2017 0934   BILITOT 0.41 01/04/2014 0943   GFRNONAA >60 01/03/2016 0517   GFRNONAA >89 12/25/2011 1132   GFRAA >60 01/03/2016 0517   GFRAA >89 12/25/2011 1132   Lab Results  Component Value Date   CHOL 169 06/10/2018   HDL 51.90 06/10/2018   LDLCALC 101 (H) 06/10/2018   TRIG 81.0 06/10/2018   CHOLHDL 3 06/10/2018   Lab Results  Component Value Date   HGBA1C 6.1 06/10/2018   Lab Results  Component Value Date   VITAMINB12 375 06/23/2018   Lab Results  Component Value Date   TSH 1.760 06/23/2018      ASSESSMENT AND PLAN 68 y.o.  year old male   He has been carrying a diagnosis of multiple sclerosis  But with his age group, MRI of the brain does not support a diagnosis of multiple sclerosis,  I have suggested him to stop taking Avonex, long-term use of Avenox without helping his symptoms  Gait abnormality Worsening weakness of bilateral lower extremity  His gait difficulty are combination of cervical myelopathy, radiculopathy, thoracic myelopathy,  lumbar radiculopathy,   Refer him to home physical therapy,  He does not want to consider surgical option.    Marcial Pacas, M.D. Ph.D.  [facility] [place], [place] [address] Phone: [phone] Fax:      [phone]

## 2018-08-16 DIAGNOSIS — I10 Essential (primary) hypertension: Secondary | ICD-10-CM | POA: Diagnosis not present

## 2018-08-16 DIAGNOSIS — G35 Multiple sclerosis: Secondary | ICD-10-CM | POA: Diagnosis not present

## 2018-08-16 DIAGNOSIS — E118 Type 2 diabetes mellitus with unspecified complications: Secondary | ICD-10-CM | POA: Diagnosis not present

## 2018-08-16 DIAGNOSIS — M961 Postlaminectomy syndrome, not elsewhere classified: Secondary | ICD-10-CM | POA: Diagnosis not present

## 2018-08-16 DIAGNOSIS — M6281 Muscle weakness (generalized): Secondary | ICD-10-CM | POA: Diagnosis not present

## 2018-08-16 DIAGNOSIS — R269 Unspecified abnormalities of gait and mobility: Secondary | ICD-10-CM | POA: Diagnosis not present

## 2018-08-18 DIAGNOSIS — E118 Type 2 diabetes mellitus with unspecified complications: Secondary | ICD-10-CM | POA: Diagnosis not present

## 2018-08-18 DIAGNOSIS — R269 Unspecified abnormalities of gait and mobility: Secondary | ICD-10-CM | POA: Diagnosis not present

## 2018-08-18 DIAGNOSIS — M961 Postlaminectomy syndrome, not elsewhere classified: Secondary | ICD-10-CM | POA: Diagnosis not present

## 2018-08-18 DIAGNOSIS — M6281 Muscle weakness (generalized): Secondary | ICD-10-CM | POA: Diagnosis not present

## 2018-08-18 DIAGNOSIS — I10 Essential (primary) hypertension: Secondary | ICD-10-CM | POA: Diagnosis not present

## 2018-08-18 DIAGNOSIS — G35 Multiple sclerosis: Secondary | ICD-10-CM | POA: Diagnosis not present

## 2018-08-23 ENCOUNTER — Other Ambulatory Visit: Payer: Self-pay | Admitting: Nurse Practitioner

## 2018-08-23 DIAGNOSIS — R269 Unspecified abnormalities of gait and mobility: Secondary | ICD-10-CM | POA: Diagnosis not present

## 2018-08-23 DIAGNOSIS — G35 Multiple sclerosis: Secondary | ICD-10-CM | POA: Diagnosis not present

## 2018-08-23 DIAGNOSIS — M961 Postlaminectomy syndrome, not elsewhere classified: Secondary | ICD-10-CM | POA: Diagnosis not present

## 2018-08-23 DIAGNOSIS — G629 Polyneuropathy, unspecified: Secondary | ICD-10-CM

## 2018-08-23 DIAGNOSIS — M6281 Muscle weakness (generalized): Secondary | ICD-10-CM | POA: Diagnosis not present

## 2018-08-23 DIAGNOSIS — E118 Type 2 diabetes mellitus with unspecified complications: Secondary | ICD-10-CM | POA: Diagnosis not present

## 2018-08-23 DIAGNOSIS — I10 Essential (primary) hypertension: Secondary | ICD-10-CM | POA: Diagnosis not present

## 2018-08-23 NOTE — Telephone Encounter (Signed)
Left vm for the pt to call back.  

## 2018-08-23 NOTE — Telephone Encounter (Signed)
Pt stated he is using [facility] for this med. Med cancelled from [facility] and resend to [facility].

## 2018-08-23 NOTE — Telephone Encounter (Signed)
6months refill was sent to local pharmacy in January. Please inquire from patient if he is now changing pharmacy? If so, need to cancel prescription at [facility], then send to [facility]

## 2018-08-25 DIAGNOSIS — M961 Postlaminectomy syndrome, not elsewhere classified: Secondary | ICD-10-CM | POA: Diagnosis not present

## 2018-08-25 DIAGNOSIS — M6281 Muscle weakness (generalized): Secondary | ICD-10-CM | POA: Diagnosis not present

## 2018-08-25 DIAGNOSIS — I10 Essential (primary) hypertension: Secondary | ICD-10-CM | POA: Diagnosis not present

## 2018-08-25 DIAGNOSIS — R269 Unspecified abnormalities of gait and mobility: Secondary | ICD-10-CM | POA: Diagnosis not present

## 2018-08-25 DIAGNOSIS — G35 Multiple sclerosis: Secondary | ICD-10-CM | POA: Diagnosis not present

## 2018-08-25 DIAGNOSIS — E118 Type 2 diabetes mellitus with unspecified complications: Secondary | ICD-10-CM | POA: Diagnosis not present

## 2018-08-30 DIAGNOSIS — M6281 Muscle weakness (generalized): Secondary | ICD-10-CM | POA: Diagnosis not present

## 2018-08-30 DIAGNOSIS — R269 Unspecified abnormalities of gait and mobility: Secondary | ICD-10-CM | POA: Diagnosis not present

## 2018-08-30 DIAGNOSIS — E118 Type 2 diabetes mellitus with unspecified complications: Secondary | ICD-10-CM | POA: Diagnosis not present

## 2018-08-30 DIAGNOSIS — M961 Postlaminectomy syndrome, not elsewhere classified: Secondary | ICD-10-CM | POA: Diagnosis not present

## 2018-08-30 DIAGNOSIS — G35 Multiple sclerosis: Secondary | ICD-10-CM | POA: Diagnosis not present

## 2018-08-30 DIAGNOSIS — I10 Essential (primary) hypertension: Secondary | ICD-10-CM | POA: Diagnosis not present

## 2018-08-31 ENCOUNTER — Telehealth: Payer: Self-pay | Admitting: Nurse Practitioner

## 2018-08-31 NOTE — Telephone Encounter (Signed)
Larry [name], from [facility] left voicemail message to request med clarification of the  supervising physician of Larry [name] and  Machine and NPI number. Information can provided and called to [phone] or a fax can be sent to  [phone] ref# [id number].

## 2018-08-31 NOTE — Telephone Encounter (Signed)
Spoke with [facility] rep Jenny Reichmann).

## 2018-09-01 DIAGNOSIS — G35 Multiple sclerosis: Secondary | ICD-10-CM | POA: Diagnosis not present

## 2018-09-01 DIAGNOSIS — E118 Type 2 diabetes mellitus with unspecified complications: Secondary | ICD-10-CM | POA: Diagnosis not present

## 2018-09-01 DIAGNOSIS — R269 Unspecified abnormalities of gait and mobility: Secondary | ICD-10-CM | POA: Diagnosis not present

## 2018-09-01 DIAGNOSIS — M961 Postlaminectomy syndrome, not elsewhere classified: Secondary | ICD-10-CM | POA: Diagnosis not present

## 2018-09-01 DIAGNOSIS — M6281 Muscle weakness (generalized): Secondary | ICD-10-CM | POA: Diagnosis not present

## 2018-09-01 DIAGNOSIS — I10 Essential (primary) hypertension: Secondary | ICD-10-CM | POA: Diagnosis not present

## 2018-09-03 DIAGNOSIS — M4802 Spinal stenosis, cervical region: Secondary | ICD-10-CM | POA: Diagnosis not present

## 2018-09-03 DIAGNOSIS — G35 Multiple sclerosis: Secondary | ICD-10-CM | POA: Diagnosis not present

## 2018-09-03 DIAGNOSIS — M4316 Spondylolisthesis, lumbar region: Secondary | ICD-10-CM | POA: Diagnosis not present

## 2018-09-03 DIAGNOSIS — G8220 Paraplegia, unspecified: Secondary | ICD-10-CM | POA: Diagnosis not present

## 2018-09-06 DIAGNOSIS — E118 Type 2 diabetes mellitus with unspecified complications: Secondary | ICD-10-CM | POA: Diagnosis not present

## 2018-09-06 DIAGNOSIS — R269 Unspecified abnormalities of gait and mobility: Secondary | ICD-10-CM | POA: Diagnosis not present

## 2018-09-06 DIAGNOSIS — M961 Postlaminectomy syndrome, not elsewhere classified: Secondary | ICD-10-CM | POA: Diagnosis not present

## 2018-09-06 DIAGNOSIS — M6281 Muscle weakness (generalized): Secondary | ICD-10-CM | POA: Diagnosis not present

## 2018-09-06 DIAGNOSIS — I10 Essential (primary) hypertension: Secondary | ICD-10-CM | POA: Diagnosis not present

## 2018-09-06 DIAGNOSIS — G35 Multiple sclerosis: Secondary | ICD-10-CM | POA: Diagnosis not present

## 2018-09-08 ENCOUNTER — Encounter: Payer: Self-pay | Admitting: Nurse Practitioner

## 2018-09-08 DIAGNOSIS — E118 Type 2 diabetes mellitus with unspecified complications: Secondary | ICD-10-CM | POA: Diagnosis not present

## 2018-09-08 DIAGNOSIS — M961 Postlaminectomy syndrome, not elsewhere classified: Secondary | ICD-10-CM | POA: Diagnosis not present

## 2018-09-08 DIAGNOSIS — R269 Unspecified abnormalities of gait and mobility: Secondary | ICD-10-CM | POA: Diagnosis not present

## 2018-09-08 DIAGNOSIS — M6281 Muscle weakness (generalized): Secondary | ICD-10-CM | POA: Diagnosis not present

## 2018-09-08 DIAGNOSIS — G35 Multiple sclerosis: Secondary | ICD-10-CM | POA: Diagnosis not present

## 2018-09-08 DIAGNOSIS — I10 Essential (primary) hypertension: Secondary | ICD-10-CM | POA: Diagnosis not present

## 2018-09-13 DIAGNOSIS — M961 Postlaminectomy syndrome, not elsewhere classified: Secondary | ICD-10-CM | POA: Diagnosis not present

## 2018-09-13 DIAGNOSIS — I10 Essential (primary) hypertension: Secondary | ICD-10-CM | POA: Diagnosis not present

## 2018-09-13 DIAGNOSIS — G35 Multiple sclerosis: Secondary | ICD-10-CM | POA: Diagnosis not present

## 2018-09-13 DIAGNOSIS — M6281 Muscle weakness (generalized): Secondary | ICD-10-CM | POA: Diagnosis not present

## 2018-09-13 DIAGNOSIS — E118 Type 2 diabetes mellitus with unspecified complications: Secondary | ICD-10-CM | POA: Diagnosis not present

## 2018-09-13 DIAGNOSIS — R269 Unspecified abnormalities of gait and mobility: Secondary | ICD-10-CM | POA: Diagnosis not present

## 2018-09-15 DIAGNOSIS — M961 Postlaminectomy syndrome, not elsewhere classified: Secondary | ICD-10-CM | POA: Diagnosis not present

## 2018-09-15 DIAGNOSIS — G35 Multiple sclerosis: Secondary | ICD-10-CM | POA: Diagnosis not present

## 2018-09-15 DIAGNOSIS — R269 Unspecified abnormalities of gait and mobility: Secondary | ICD-10-CM | POA: Diagnosis not present

## 2018-09-15 DIAGNOSIS — M6281 Muscle weakness (generalized): Secondary | ICD-10-CM | POA: Diagnosis not present

## 2018-09-15 DIAGNOSIS — I10 Essential (primary) hypertension: Secondary | ICD-10-CM | POA: Diagnosis not present

## 2018-09-15 DIAGNOSIS — E118 Type 2 diabetes mellitus with unspecified complications: Secondary | ICD-10-CM | POA: Diagnosis not present

## 2018-09-20 DIAGNOSIS — I10 Essential (primary) hypertension: Secondary | ICD-10-CM | POA: Diagnosis not present

## 2018-09-20 DIAGNOSIS — G35 Multiple sclerosis: Secondary | ICD-10-CM | POA: Diagnosis not present

## 2018-09-20 DIAGNOSIS — M6281 Muscle weakness (generalized): Secondary | ICD-10-CM | POA: Diagnosis not present

## 2018-09-20 DIAGNOSIS — E118 Type 2 diabetes mellitus with unspecified complications: Secondary | ICD-10-CM | POA: Diagnosis not present

## 2018-09-20 DIAGNOSIS — R269 Unspecified abnormalities of gait and mobility: Secondary | ICD-10-CM | POA: Diagnosis not present

## 2018-09-20 DIAGNOSIS — M961 Postlaminectomy syndrome, not elsewhere classified: Secondary | ICD-10-CM | POA: Diagnosis not present

## 2018-09-22 DIAGNOSIS — M6281 Muscle weakness (generalized): Secondary | ICD-10-CM | POA: Diagnosis not present

## 2018-09-22 DIAGNOSIS — R269 Unspecified abnormalities of gait and mobility: Secondary | ICD-10-CM | POA: Diagnosis not present

## 2018-09-22 DIAGNOSIS — G35 Multiple sclerosis: Secondary | ICD-10-CM | POA: Diagnosis not present

## 2018-09-22 DIAGNOSIS — M961 Postlaminectomy syndrome, not elsewhere classified: Secondary | ICD-10-CM | POA: Diagnosis not present

## 2018-09-22 DIAGNOSIS — E118 Type 2 diabetes mellitus with unspecified complications: Secondary | ICD-10-CM | POA: Diagnosis not present

## 2018-09-22 DIAGNOSIS — I10 Essential (primary) hypertension: Secondary | ICD-10-CM | POA: Diagnosis not present

## 2018-09-26 DIAGNOSIS — I10 Essential (primary) hypertension: Secondary | ICD-10-CM | POA: Diagnosis not present

## 2018-09-26 DIAGNOSIS — G35 Multiple sclerosis: Secondary | ICD-10-CM | POA: Diagnosis not present

## 2018-09-26 DIAGNOSIS — M6281 Muscle weakness (generalized): Secondary | ICD-10-CM | POA: Diagnosis not present

## 2018-09-26 DIAGNOSIS — R269 Unspecified abnormalities of gait and mobility: Secondary | ICD-10-CM | POA: Diagnosis not present

## 2018-09-26 DIAGNOSIS — E118 Type 2 diabetes mellitus with unspecified complications: Secondary | ICD-10-CM | POA: Diagnosis not present

## 2018-09-26 DIAGNOSIS — M961 Postlaminectomy syndrome, not elsewhere classified: Secondary | ICD-10-CM | POA: Diagnosis not present

## 2018-09-29 DIAGNOSIS — E118 Type 2 diabetes mellitus with unspecified complications: Secondary | ICD-10-CM | POA: Diagnosis not present

## 2018-09-29 DIAGNOSIS — R269 Unspecified abnormalities of gait and mobility: Secondary | ICD-10-CM | POA: Diagnosis not present

## 2018-09-29 DIAGNOSIS — I10 Essential (primary) hypertension: Secondary | ICD-10-CM | POA: Diagnosis not present

## 2018-09-29 DIAGNOSIS — M6281 Muscle weakness (generalized): Secondary | ICD-10-CM | POA: Diagnosis not present

## 2018-09-29 DIAGNOSIS — G35 Multiple sclerosis: Secondary | ICD-10-CM | POA: Diagnosis not present

## 2018-09-29 DIAGNOSIS — M961 Postlaminectomy syndrome, not elsewhere classified: Secondary | ICD-10-CM | POA: Diagnosis not present

## 2018-10-03 DIAGNOSIS — G8220 Paraplegia, unspecified: Secondary | ICD-10-CM | POA: Diagnosis not present

## 2018-10-03 DIAGNOSIS — M4802 Spinal stenosis, cervical region: Secondary | ICD-10-CM | POA: Diagnosis not present

## 2018-10-03 DIAGNOSIS — R269 Unspecified abnormalities of gait and mobility: Secondary | ICD-10-CM | POA: Diagnosis not present

## 2018-10-03 DIAGNOSIS — G35 Multiple sclerosis: Secondary | ICD-10-CM | POA: Diagnosis not present

## 2018-10-03 DIAGNOSIS — M961 Postlaminectomy syndrome, not elsewhere classified: Secondary | ICD-10-CM | POA: Diagnosis not present

## 2018-10-03 DIAGNOSIS — E118 Type 2 diabetes mellitus with unspecified complications: Secondary | ICD-10-CM | POA: Diagnosis not present

## 2018-10-03 DIAGNOSIS — M6281 Muscle weakness (generalized): Secondary | ICD-10-CM | POA: Diagnosis not present

## 2018-10-03 DIAGNOSIS — M4316 Spondylolisthesis, lumbar region: Secondary | ICD-10-CM | POA: Diagnosis not present

## 2018-10-03 DIAGNOSIS — I10 Essential (primary) hypertension: Secondary | ICD-10-CM | POA: Diagnosis not present

## 2018-10-06 DIAGNOSIS — G35 Multiple sclerosis: Secondary | ICD-10-CM | POA: Diagnosis not present

## 2018-10-06 DIAGNOSIS — R269 Unspecified abnormalities of gait and mobility: Secondary | ICD-10-CM | POA: Diagnosis not present

## 2018-10-06 DIAGNOSIS — I10 Essential (primary) hypertension: Secondary | ICD-10-CM | POA: Diagnosis not present

## 2018-10-06 DIAGNOSIS — E118 Type 2 diabetes mellitus with unspecified complications: Secondary | ICD-10-CM | POA: Diagnosis not present

## 2018-10-06 DIAGNOSIS — M961 Postlaminectomy syndrome, not elsewhere classified: Secondary | ICD-10-CM | POA: Diagnosis not present

## 2018-10-06 DIAGNOSIS — M6281 Muscle weakness (generalized): Secondary | ICD-10-CM | POA: Diagnosis not present

## 2018-10-10 DIAGNOSIS — I10 Essential (primary) hypertension: Secondary | ICD-10-CM | POA: Diagnosis not present

## 2018-10-10 DIAGNOSIS — G35 Multiple sclerosis: Secondary | ICD-10-CM | POA: Diagnosis not present

## 2018-10-10 DIAGNOSIS — M961 Postlaminectomy syndrome, not elsewhere classified: Secondary | ICD-10-CM | POA: Diagnosis not present

## 2018-10-10 DIAGNOSIS — R269 Unspecified abnormalities of gait and mobility: Secondary | ICD-10-CM | POA: Diagnosis not present

## 2018-10-10 DIAGNOSIS — M6281 Muscle weakness (generalized): Secondary | ICD-10-CM | POA: Diagnosis not present

## 2018-10-10 DIAGNOSIS — E118 Type 2 diabetes mellitus with unspecified complications: Secondary | ICD-10-CM | POA: Diagnosis not present

## 2018-10-11 ENCOUNTER — Other Ambulatory Visit: Payer: Self-pay | Admitting: Neurology

## 2018-10-11 ENCOUNTER — Other Ambulatory Visit: Payer: Self-pay | Admitting: Nurse Practitioner

## 2018-10-11 DIAGNOSIS — E118 Type 2 diabetes mellitus with unspecified complications: Secondary | ICD-10-CM

## 2018-10-13 DIAGNOSIS — E118 Type 2 diabetes mellitus with unspecified complications: Secondary | ICD-10-CM | POA: Diagnosis not present

## 2018-10-13 DIAGNOSIS — R269 Unspecified abnormalities of gait and mobility: Secondary | ICD-10-CM | POA: Diagnosis not present

## 2018-10-13 DIAGNOSIS — I10 Essential (primary) hypertension: Secondary | ICD-10-CM | POA: Diagnosis not present

## 2018-10-13 DIAGNOSIS — G35 Multiple sclerosis: Secondary | ICD-10-CM | POA: Diagnosis not present

## 2018-10-13 DIAGNOSIS — M961 Postlaminectomy syndrome, not elsewhere classified: Secondary | ICD-10-CM | POA: Diagnosis not present

## 2018-10-13 DIAGNOSIS — M6281 Muscle weakness (generalized): Secondary | ICD-10-CM | POA: Diagnosis not present

## 2018-10-17 DIAGNOSIS — R269 Unspecified abnormalities of gait and mobility: Secondary | ICD-10-CM | POA: Diagnosis not present

## 2018-10-17 DIAGNOSIS — I10 Essential (primary) hypertension: Secondary | ICD-10-CM | POA: Diagnosis not present

## 2018-10-17 DIAGNOSIS — G35 Multiple sclerosis: Secondary | ICD-10-CM | POA: Diagnosis not present

## 2018-10-17 DIAGNOSIS — M961 Postlaminectomy syndrome, not elsewhere classified: Secondary | ICD-10-CM | POA: Diagnosis not present

## 2018-10-17 DIAGNOSIS — E118 Type 2 diabetes mellitus with unspecified complications: Secondary | ICD-10-CM | POA: Diagnosis not present

## 2018-10-17 DIAGNOSIS — M6281 Muscle weakness (generalized): Secondary | ICD-10-CM | POA: Diagnosis not present

## 2018-10-20 DIAGNOSIS — I10 Essential (primary) hypertension: Secondary | ICD-10-CM | POA: Diagnosis not present

## 2018-10-20 DIAGNOSIS — M6281 Muscle weakness (generalized): Secondary | ICD-10-CM | POA: Diagnosis not present

## 2018-10-20 DIAGNOSIS — R269 Unspecified abnormalities of gait and mobility: Secondary | ICD-10-CM | POA: Diagnosis not present

## 2018-10-20 DIAGNOSIS — E118 Type 2 diabetes mellitus with unspecified complications: Secondary | ICD-10-CM | POA: Diagnosis not present

## 2018-10-20 DIAGNOSIS — M961 Postlaminectomy syndrome, not elsewhere classified: Secondary | ICD-10-CM | POA: Diagnosis not present

## 2018-10-20 DIAGNOSIS — G35 Multiple sclerosis: Secondary | ICD-10-CM | POA: Diagnosis not present

## 2018-10-25 DIAGNOSIS — I10 Essential (primary) hypertension: Secondary | ICD-10-CM | POA: Diagnosis not present

## 2018-10-25 DIAGNOSIS — M961 Postlaminectomy syndrome, not elsewhere classified: Secondary | ICD-10-CM | POA: Diagnosis not present

## 2018-10-25 DIAGNOSIS — E118 Type 2 diabetes mellitus with unspecified complications: Secondary | ICD-10-CM | POA: Diagnosis not present

## 2018-10-25 DIAGNOSIS — R269 Unspecified abnormalities of gait and mobility: Secondary | ICD-10-CM | POA: Diagnosis not present

## 2018-10-25 DIAGNOSIS — M6281 Muscle weakness (generalized): Secondary | ICD-10-CM | POA: Diagnosis not present

## 2018-10-25 DIAGNOSIS — G35 Multiple sclerosis: Secondary | ICD-10-CM | POA: Diagnosis not present

## 2018-10-27 DIAGNOSIS — R269 Unspecified abnormalities of gait and mobility: Secondary | ICD-10-CM | POA: Diagnosis not present

## 2018-10-27 DIAGNOSIS — M961 Postlaminectomy syndrome, not elsewhere classified: Secondary | ICD-10-CM | POA: Diagnosis not present

## 2018-10-27 DIAGNOSIS — G35 Multiple sclerosis: Secondary | ICD-10-CM | POA: Diagnosis not present

## 2018-10-27 DIAGNOSIS — M6281 Muscle weakness (generalized): Secondary | ICD-10-CM | POA: Diagnosis not present

## 2018-10-27 DIAGNOSIS — I10 Essential (primary) hypertension: Secondary | ICD-10-CM | POA: Diagnosis not present

## 2018-10-27 DIAGNOSIS — E118 Type 2 diabetes mellitus with unspecified complications: Secondary | ICD-10-CM | POA: Diagnosis not present

## 2018-10-31 DIAGNOSIS — E118 Type 2 diabetes mellitus with unspecified complications: Secondary | ICD-10-CM | POA: Diagnosis not present

## 2018-10-31 DIAGNOSIS — M961 Postlaminectomy syndrome, not elsewhere classified: Secondary | ICD-10-CM | POA: Diagnosis not present

## 2018-10-31 DIAGNOSIS — R269 Unspecified abnormalities of gait and mobility: Secondary | ICD-10-CM | POA: Diagnosis not present

## 2018-10-31 DIAGNOSIS — G35 Multiple sclerosis: Secondary | ICD-10-CM | POA: Diagnosis not present

## 2018-10-31 DIAGNOSIS — I10 Essential (primary) hypertension: Secondary | ICD-10-CM | POA: Diagnosis not present

## 2018-10-31 DIAGNOSIS — M6281 Muscle weakness (generalized): Secondary | ICD-10-CM | POA: Diagnosis not present

## 2018-11-03 DIAGNOSIS — M4316 Spondylolisthesis, lumbar region: Secondary | ICD-10-CM | POA: Diagnosis not present

## 2018-11-03 DIAGNOSIS — I10 Essential (primary) hypertension: Secondary | ICD-10-CM | POA: Diagnosis not present

## 2018-11-03 DIAGNOSIS — M6281 Muscle weakness (generalized): Secondary | ICD-10-CM | POA: Diagnosis not present

## 2018-11-03 DIAGNOSIS — R269 Unspecified abnormalities of gait and mobility: Secondary | ICD-10-CM | POA: Diagnosis not present

## 2018-11-03 DIAGNOSIS — E118 Type 2 diabetes mellitus with unspecified complications: Secondary | ICD-10-CM | POA: Diagnosis not present

## 2018-11-03 DIAGNOSIS — M4802 Spinal stenosis, cervical region: Secondary | ICD-10-CM | POA: Diagnosis not present

## 2018-11-03 DIAGNOSIS — M961 Postlaminectomy syndrome, not elsewhere classified: Secondary | ICD-10-CM | POA: Diagnosis not present

## 2018-11-03 DIAGNOSIS — G8220 Paraplegia, unspecified: Secondary | ICD-10-CM | POA: Diagnosis not present

## 2018-11-03 DIAGNOSIS — G35 Multiple sclerosis: Secondary | ICD-10-CM | POA: Diagnosis not present

## 2018-11-07 DIAGNOSIS — E118 Type 2 diabetes mellitus with unspecified complications: Secondary | ICD-10-CM | POA: Diagnosis not present

## 2018-11-07 DIAGNOSIS — M6281 Muscle weakness (generalized): Secondary | ICD-10-CM | POA: Diagnosis not present

## 2018-11-07 DIAGNOSIS — M961 Postlaminectomy syndrome, not elsewhere classified: Secondary | ICD-10-CM | POA: Diagnosis not present

## 2018-11-07 DIAGNOSIS — I10 Essential (primary) hypertension: Secondary | ICD-10-CM | POA: Diagnosis not present

## 2018-11-07 DIAGNOSIS — G35 Multiple sclerosis: Secondary | ICD-10-CM | POA: Diagnosis not present

## 2018-11-07 DIAGNOSIS — R269 Unspecified abnormalities of gait and mobility: Secondary | ICD-10-CM | POA: Diagnosis not present

## 2018-11-10 DIAGNOSIS — M6281 Muscle weakness (generalized): Secondary | ICD-10-CM | POA: Diagnosis not present

## 2018-11-10 DIAGNOSIS — I10 Essential (primary) hypertension: Secondary | ICD-10-CM | POA: Diagnosis not present

## 2018-11-10 DIAGNOSIS — G35 Multiple sclerosis: Secondary | ICD-10-CM | POA: Diagnosis not present

## 2018-11-10 DIAGNOSIS — R269 Unspecified abnormalities of gait and mobility: Secondary | ICD-10-CM | POA: Diagnosis not present

## 2018-11-10 DIAGNOSIS — E118 Type 2 diabetes mellitus with unspecified complications: Secondary | ICD-10-CM | POA: Diagnosis not present

## 2018-11-10 DIAGNOSIS — M961 Postlaminectomy syndrome, not elsewhere classified: Secondary | ICD-10-CM | POA: Diagnosis not present

## 2018-11-14 DIAGNOSIS — E118 Type 2 diabetes mellitus with unspecified complications: Secondary | ICD-10-CM | POA: Diagnosis not present

## 2018-11-14 DIAGNOSIS — R269 Unspecified abnormalities of gait and mobility: Secondary | ICD-10-CM | POA: Diagnosis not present

## 2018-11-14 DIAGNOSIS — G35 Multiple sclerosis: Secondary | ICD-10-CM | POA: Diagnosis not present

## 2018-11-14 DIAGNOSIS — M961 Postlaminectomy syndrome, not elsewhere classified: Secondary | ICD-10-CM | POA: Diagnosis not present

## 2018-11-14 DIAGNOSIS — M6281 Muscle weakness (generalized): Secondary | ICD-10-CM | POA: Diagnosis not present

## 2018-11-14 DIAGNOSIS — I10 Essential (primary) hypertension: Secondary | ICD-10-CM | POA: Diagnosis not present

## 2018-11-17 DIAGNOSIS — I10 Essential (primary) hypertension: Secondary | ICD-10-CM | POA: Diagnosis not present

## 2018-11-17 DIAGNOSIS — G35 Multiple sclerosis: Secondary | ICD-10-CM | POA: Diagnosis not present

## 2018-11-17 DIAGNOSIS — M961 Postlaminectomy syndrome, not elsewhere classified: Secondary | ICD-10-CM | POA: Diagnosis not present

## 2018-11-17 DIAGNOSIS — E118 Type 2 diabetes mellitus with unspecified complications: Secondary | ICD-10-CM | POA: Diagnosis not present

## 2018-11-17 DIAGNOSIS — M6281 Muscle weakness (generalized): Secondary | ICD-10-CM | POA: Diagnosis not present

## 2018-11-17 DIAGNOSIS — R269 Unspecified abnormalities of gait and mobility: Secondary | ICD-10-CM | POA: Diagnosis not present

## 2018-11-21 DIAGNOSIS — G35 Multiple sclerosis: Secondary | ICD-10-CM | POA: Diagnosis not present

## 2018-11-21 DIAGNOSIS — E118 Type 2 diabetes mellitus with unspecified complications: Secondary | ICD-10-CM | POA: Diagnosis not present

## 2018-11-21 DIAGNOSIS — M6281 Muscle weakness (generalized): Secondary | ICD-10-CM | POA: Diagnosis not present

## 2018-11-21 DIAGNOSIS — M961 Postlaminectomy syndrome, not elsewhere classified: Secondary | ICD-10-CM | POA: Diagnosis not present

## 2018-11-21 DIAGNOSIS — I10 Essential (primary) hypertension: Secondary | ICD-10-CM | POA: Diagnosis not present

## 2018-11-21 DIAGNOSIS — R269 Unspecified abnormalities of gait and mobility: Secondary | ICD-10-CM | POA: Diagnosis not present

## 2018-11-24 DIAGNOSIS — G35 Multiple sclerosis: Secondary | ICD-10-CM | POA: Diagnosis not present

## 2018-11-24 DIAGNOSIS — M6281 Muscle weakness (generalized): Secondary | ICD-10-CM | POA: Diagnosis not present

## 2018-11-24 DIAGNOSIS — M961 Postlaminectomy syndrome, not elsewhere classified: Secondary | ICD-10-CM | POA: Diagnosis not present

## 2018-11-24 DIAGNOSIS — E118 Type 2 diabetes mellitus with unspecified complications: Secondary | ICD-10-CM | POA: Diagnosis not present

## 2018-11-24 DIAGNOSIS — I10 Essential (primary) hypertension: Secondary | ICD-10-CM | POA: Diagnosis not present

## 2018-11-24 DIAGNOSIS — R269 Unspecified abnormalities of gait and mobility: Secondary | ICD-10-CM | POA: Diagnosis not present

## 2018-12-03 DIAGNOSIS — M4802 Spinal stenosis, cervical region: Secondary | ICD-10-CM | POA: Diagnosis not present

## 2018-12-03 DIAGNOSIS — G8220 Paraplegia, unspecified: Secondary | ICD-10-CM | POA: Diagnosis not present

## 2018-12-03 DIAGNOSIS — M4316 Spondylolisthesis, lumbar region: Secondary | ICD-10-CM | POA: Diagnosis not present

## 2018-12-03 DIAGNOSIS — G35 Multiple sclerosis: Secondary | ICD-10-CM | POA: Diagnosis not present

## 2018-12-12 ENCOUNTER — Other Ambulatory Visit: Payer: Self-pay

## 2018-12-12 ENCOUNTER — Encounter: Payer: Self-pay | Admitting: Nurse Practitioner

## 2018-12-12 ENCOUNTER — Ambulatory Visit (INDEPENDENT_AMBULATORY_CARE_PROVIDER_SITE_OTHER): Payer: Medicare Other | Admitting: Nurse Practitioner

## 2018-12-12 VITALS — BP 129/57 | HR 58 | Ht 67.0 in

## 2018-12-12 DIAGNOSIS — I152 Hypertension secondary to endocrine disorders: Secondary | ICD-10-CM | POA: Diagnosis not present

## 2018-12-12 DIAGNOSIS — N401 Enlarged prostate with lower urinary tract symptoms: Secondary | ICD-10-CM

## 2018-12-12 DIAGNOSIS — N3281 Overactive bladder: Secondary | ICD-10-CM

## 2018-12-12 DIAGNOSIS — G629 Polyneuropathy, unspecified: Secondary | ICD-10-CM

## 2018-12-12 DIAGNOSIS — E1142 Type 2 diabetes mellitus with diabetic polyneuropathy: Secondary | ICD-10-CM

## 2018-12-12 DIAGNOSIS — R350 Frequency of micturition: Secondary | ICD-10-CM

## 2018-12-12 MED ORDER — FINASTERIDE 5 MG PO TABS: 5.0000 mg | ORAL_TABLET | Freq: Every day | ORAL | 1 refills | Status: DC

## 2018-12-12 MED ORDER — OXYBUTYNIN CHLORIDE ER 5 MG PO TB24: 5.0000 mg | ORAL_TABLET | Freq: Every day | ORAL | 5 refills | Status: DC

## 2018-12-12 MED ORDER — CLONIDINE HCL 0.1 MG PO TABS: 0.1000 mg | ORAL_TABLET | Freq: Two times a day (BID) | ORAL | 1 refills | Status: DC

## 2018-12-12 NOTE — Patient Instructions (Signed)
Go to lab for blood draw and urine collection 12/19/2018 at 11am With progressive weakness, encourage to use wheelchair to avoid falls. Discuss progressive muscle weakness with Dr. Krista Blue.

## 2018-12-12 NOTE — Progress Notes (Signed)
Virtual Visit via Video Note  I connected with Larry [name] on 12/12/18 at  9:15 AM EDT by a video enabled telemedicine application and verified that I am speaking with the correct person using two identifiers.  Location: Patient:Home Provider: Office   I discussed the limitations of evaluation and management by telemedicine and the availability of in person appointments. The patient expressed understanding and agreed to proceed.   CC: f/up HTN, DM, persistent urinary frequency and weakness.  History of Present Illness: HTN: Stable with clonidine, olmesartan, amlodipine, and HCTZ  Denies any dizziness or headache, or syncope. BP Readings from Last 3 Encounters:  12/12/18 (!) 129/57  08/08/18 (!) 161/82  06/23/18 (!) 144/80   DM: Controlled with metformin, and actos Last hgbA1c of 6.1 Denies any adverse effect with above medications   Urinary Frequency  This is a recurrent problem. The problem occurs every urination. The problem has been waxing and waning. The patient is experiencing no pain. There has been no fever. He is not sexually active. There is no history of pyelonephritis. Associated symptoms include frequency and urgency. Pertinent negatives include no chills, discharge, flank pain, hematuria, hesitancy or sweats. He has tried nothing for the symptoms. His past medical history is significant for urinary stasis. There is no history of a urological procedure.  hx of BPH: use of proscar and flomax. Treated for UTI 06/2018 with cipro x10days with some improvement.  Muscle Weakness: Progressive secondary to MS diagnosis. Followed by Dr. Krista Blue (neurology)  Observations/Objective: Physical Exam  Constitutional: He is oriented to person, place, and time. No distress.  Pulmonary/Chest: Effort normal.  Neurological: He is alert and oriented to person, place, and time.  Skin: He is not diaphoretic.  Psychiatric: He has a normal mood and affect. His behavior is normal. Thought  content normal.  Vitals reviewed.  Assessment and Plan: Larry [name] was seen today for follow-up.  Diagnoses and all orders for this visit:  Hypertension due to endocrine disorder -     Hemoglobin A1c; Future -     Basic metabolic panel; Future -     cloNIDine (CATAPRES) 0.1 MG tablet; Take 1 tablet (0.1 mg total) by mouth 2 (two) times daily.  Type 2 diabetes mellitus with diabetic polyneuropathy, without long-term current use of insulin (HCC) -     Hemoglobin A1c; Future -     Basic metabolic panel; Future -     Hepatic function panel; Future  Urinary frequency -     Urinalysis w microscopic + reflex cultur; Future  Peripheral polyneuropathy -     cloNIDine (CATAPRES) 0.1 MG tablet; Take 1 tablet (0.1 mg total) by mouth 2 (two) times daily.  Benign prostatic hyperplasia with lower urinary tract symptoms, symptom details unspecified -     finasteride (PROSCAR) 5 MG tablet; Take 1 tablet (5 mg total) by mouth daily. -     oxybutynin (DITROPAN-XL) 5 MG 24 hr tablet; Take 1 tablet (5 mg total) by mouth at bedtime.  Overactive bladder -     oxybutynin (DITROPAN-XL) 5 MG 24 hr tablet; Take 1 tablet (5 mg total) by mouth at bedtime.   Follow Up Instructions: Go to lab for blood draw and urine collection 12/19/2018 at 11am With progressive weakness, encourage to use wheelchair to avoid falls. Discuss progressive muscle weakness with Dr. Krista Blue.  I discussed the assessment and treatment plan with the patient. The patient was provided an opportunity to ask questions and all were answered. The patient agreed with the plan  and demonstrated an understanding of the instructions.   The patient was advised to call back or seek an in-person evaluation if the symptoms worsen or if the condition fails to improve as anticipated.   Wilfred Lacy, NP

## 2018-12-12 NOTE — Assessment & Plan Note (Addendum)
Normal PSA Treated for UTI 06/2018 Repeat UA Resume ditropan XL 5mg  Hold flomax Continue proscar

## 2018-12-14 ENCOUNTER — Encounter: Payer: Self-pay | Admitting: Nurse Practitioner

## 2018-12-19 ENCOUNTER — Other Ambulatory Visit: Payer: Medicare Other

## 2018-12-29 ENCOUNTER — Telehealth: Payer: Self-pay | Admitting: Neurology

## 2018-12-29 NOTE — Telephone Encounter (Signed)
I returned the call to the patient.  He has noticed a gradual change in his gait, balance and hand weakness.  He would like to be re-evaluated in the office.  He has scheduled an appt on 01/03/2019.

## 2018-12-29 NOTE — Telephone Encounter (Signed)
Pt has called to inform since his MS has worsen he would like to get back on Avonex, please call

## 2019-01-02 NOTE — Telephone Encounter (Signed)
Called the patient and advised I could get him set up with a video visit with our NP. I have scheduled him apt on 8/17 at 12:45 pm on mychart video.

## 2019-01-02 NOTE — Telephone Encounter (Signed)
Pt requesting a call stating he would prefer not to come into the office if possible- requested to c/a appt for tomorrow and would like to discuss a VV please advise

## 2019-01-03 ENCOUNTER — Ambulatory Visit: Payer: Self-pay | Admitting: Neurology

## 2019-01-03 DIAGNOSIS — G8220 Paraplegia, unspecified: Secondary | ICD-10-CM | POA: Diagnosis not present

## 2019-01-03 DIAGNOSIS — G35 Multiple sclerosis: Secondary | ICD-10-CM | POA: Diagnosis not present

## 2019-01-03 DIAGNOSIS — M4802 Spinal stenosis, cervical region: Secondary | ICD-10-CM | POA: Diagnosis not present

## 2019-01-03 DIAGNOSIS — M4316 Spondylolisthesis, lumbar region: Secondary | ICD-10-CM | POA: Diagnosis not present

## 2019-01-11 ENCOUNTER — Other Ambulatory Visit: Payer: Self-pay | Admitting: Nurse Practitioner

## 2019-01-11 DIAGNOSIS — E1151 Type 2 diabetes mellitus with diabetic peripheral angiopathy without gangrene: Secondary | ICD-10-CM

## 2019-01-11 DIAGNOSIS — K219 Gastro-esophageal reflux disease without esophagitis: Secondary | ICD-10-CM

## 2019-01-13 NOTE — Telephone Encounter (Signed)
Larry [name] please advise,   Last ov was 11/2018--didn't get lab work done--pt stated it is heard for him to get around No future appt made. Due in 03/2019--pt was wondering if this can be video call?

## 2019-01-15 NOTE — Progress Notes (Signed)
Virtual Visit via Video Note  I connected with Larry [name] on 01/15/19 at 12:45 PM EDT by a video enabled telemedicine application and verified that I am speaking with the correct person using two identifiers.  Location: Patient: At his home Provider: In the office    I discussed the limitations of evaluation and management by telemedicine and the availability of in person appointments. The patient expressed understanding and agreed to proceed.  History of Present Illness: Larry [name] a 68 -year-old male, accompanied by her daughter Larry [name], seen in refer by his primary care physician Dr. Rodena Goldmann for evaluation of worsening gait difficulty, multiple sclerosis, initial evaluation was on June 12 2015.  He was a patient of our office in the past, last clinical visit was July 2013, he had a history of diabetes since 2002, also with diagnosis of relapsing remitting multiple sclerosis, initial presentation was gait difficulty in 1996, he fell few times, he was initially diagnosed with lumbar spinal stenosis, had lumbar spine disc decompression surgery, which fail to improve his symptoms, he continued to have worsening gait difficulty, this eventually led to hospital admission at [facility], the diagnosis of MS was based on abnormal MRI findings, spinal fluid testing, after rehabilitation, he was able to ambulate again with a cane, there was no visual loss, he was started treatment with Avonex since 1996, he moved to [place] in 1999, was under the care of Dr. Delphia Grates at [facility], was treated with Betaseron for a short period of time, but he could not tolerate it due to injection side effect, went back on Avelox shortly afterwards  over the years, he denies significant relapsing episode, he had slow decline in his walking, has been ambulate with crutches, exercise regularly  He had a history of lumbar decompression surgery in 1996  at [place] with very difficult recovery, he was in wheelchair for 6 months because of gait difficulty.  He presented to the hospital in July 2013 for worsening gait difficulty, had extensive imaging study.  I have personally reviewed MRI of the brain in 2013 showed chronic changes, cervical spine with significant cord compression at C4-5 C5-6, but no abnormal cord signal to suggest cervical MS lesions, MRI of thoracic spine showed chronic cord compression and myelomalacia at T10-11, he was evaluated by neurosurgeon, Dr. Ellene Route, the plan was to treat him with short course of steroid, followed by thoracic elective decompression, he has made significant improvement with steroid treatment, has lost follow-up,  Over the past 3 years, he had worsening gait difficulty, he is able to walk with crutches, but with worsening bending forward posture and difficulty, recent few months, he also noticed bilateral hands paresthesia. he lives in his own apartment, has 12 steps,  he denies significant low back pain, no bowel and bladder incontinence, he exercise 3 times each week,  UPDATE April 6th 2017: He was evaluated by Dr. Ellene Route in early March 2017, per patient, he was offered decompression surgery for cervical and thoracic stenosis, he also had MRI of lumbar He is now complaining of worsening right low back pain, radiating pain to right hip and right thigh,   He also began to notice bilateral hands numbness weakness since 2003, gradually getting worse, is very difficult for him to button his shirt  I have personally reviewed MRI of lumbar in March 2017: Multilevel degenerative changes, most severe at L4-5 multifactorial severe spinal stenosis lateral recess stenosis. Marked foraminal narrowing greater on the  right  UPDATE Mar 03 2016: He had L4-5 decompressive laminectomy, decompression of L4-5 nerve roots, posterior lumbar interbody arthrodeses with autograft and allograft by Dr. Ellene Route on December 30 2015, he responded very well, he no longer has significant right low back pain radiating to his right hip, his posture has improved, he can move better,  However since surgery, he noticed increasing incident of bowel incontinence, couple times each week, he denies significant problem with his bladder  We again reviewed presurgical MRI in March 2017, multilevel lumbar degenerative disc disease, L4-5 severe spinal stenosis, lateral recess stenosis, marked foraminal narrowing greater on the right, MRI of thoracic spine moderate to severe spinal stenosis at T10-11, severe bilateral foraminal stenosis, T2 hyper intensity cord signal, facet hypertrophy at T9-10, with severe bilateral foraminal stenosis, MRI of cervical spine, Mild spinal canal stenosis severe bilateral foraminal stenosis at C4-5, C5-6 and C6-7  UPDATE Mar 09 2017: He has no low back pain, bu he complains of feet cold all the time. He has regular bowel movement. Still has occasionally bowel incontinence, urinary urgency, was seen by urologist in the past  UPDATE Mar 22 2018: He has more difficulty walking, weaker in his hands, he could not lift his leg against gravity any more, his bowel and bladder issue has much improved,   He is going to have manual wheelchair soon.  UPDATE Jun 23 2018: He came in today consider worsening bilateral lower extremity weakness, increased difficulty ambulating using his upper body strength to drag bilateral lower extremity weight,  We again personally reviewed MRIs in 2017, MRI of lumbar showed severe spinal stenosis L4-5, with marked right foraminal narrowing, he had underwent lumbar decompression surgery since,  MRI of thoracic spine T10-11 multifactorial prominent spinal stenosis and cord flattening, chronic increased signal, consistent with myelomalacia,  MRI of cervical spine, multilevel degenerative changes, with mild to moderate canal stenosis, multilevel severe foraminal narrowing,  especially C7-T1  UPDATE August 08 2018: We have personally reviewed the repeat MRIs of neuroaxis, MRI of brain in Jan 2020: Multiple T2/flair hyperintensity in subcortical white matter, less likely it is account for his worsening gait abnormality, most likely represent chronic microvascular ischemic changes, less likely to be demyelinating disease.  MRI of cervical spine showed moderate spinal stenosis at C4-5, C5-6, C6-7, due to degenerative changes, the extent of spinal stenosis has mildly progressed compared to 2017, potential nerve root compression at left C7  MRI of thoracic spine showed moderate spinal stenosis at T10-11, bilateral severe foraminal narrowing with probable bilateral T10 nerve root compression,  MRI of lumbar moderately severe spinal stenosis, at L3-4, severe right lateral recess stenosis, PLIF at L4-5,  He has profound bilateral lower extremity weakness, proximal more than distal, hyperreflexia of bilateral upper extremity, bilateral hand intrinsic muscle atrophy, relatively preserved bilateral upper extremity proximal muscle weakness, above findings suggest that his thoracic, cervical spinal stenosis likely contributed to his progressive worsening bilateral lower extremity weakness, gait abnormality.  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 He says he can't walk right now, unless he has crutches. He has low stamina, he finished physical therapy a few  weeks ago. It as very helpful.  He feels his hand strength is getting weaker. His legs feel weak, they are not painful.  No changes to bowel or bladder. He lives alone, no falls.  Today's visit is virtual visit.   Observations/Objective: He is alert and oriented, answers questions appropriately, speech is clear and concise, follows commands, facial symmetry noted, symmetric eye movements, no arm drift He is posture is very forward leaning, using crutches, slow to rise from seated position, gait is very slow and intentional  Assessment and Plan: 1.  Prior diagnosis of multiple sclerosis -He has carried this diagnosis for many years, with his age,  MRI of the brain does not support a diagnosis of multiple sclerosis -He has been off Avonex since March 2020  2.  Gait abnormality 3.  Weakness of bilateral lower extremities -His gait abnormality is likely a combination of cervical myelopathy, radiculopathy, thoracic myelopathy, lumbar radiculopathy -Physical therapy was very beneficial, he completed program 3 weeks ago -Since completing physical therapy, he feels weaker, more difficulty ambulating, I have encouraged him to practice the exercises he learned at home, may need to reorder physical therapy in the future -He is agreeable to neurosurgical evaluation, I will place a referral  Follow Up Instructions: 4 months 05/09/2019 9:00 with Dr. Krista Blue   I discussed the assessment and treatment plan with the patient. The patient was provided an opportunity to ask questions and all were answered. The patient agreed with the plan and demonstrated an understanding of the instructions.   The patient was advised to call back or seek an in-person evaluation if the symptoms worsen or if the condition fails to improve as anticipated.  I provided 15 minutes of non-face-to-face time during this encounter.  Evangeline Dakin, DNP  [facility] [address] [place],  [address] [phone]

## 2019-01-16 ENCOUNTER — Encounter: Payer: Self-pay | Admitting: Neurology

## 2019-01-16 ENCOUNTER — Telehealth (INDEPENDENT_AMBULATORY_CARE_PROVIDER_SITE_OTHER): Payer: Medicare Other | Admitting: Neurology

## 2019-01-16 DIAGNOSIS — R531 Weakness: Secondary | ICD-10-CM

## 2019-01-16 DIAGNOSIS — R269 Unspecified abnormalities of gait and mobility: Secondary | ICD-10-CM

## 2019-01-18 NOTE — Progress Notes (Signed)
I have reviewed and agreed above plan. 

## 2019-01-19 ENCOUNTER — Telehealth: Payer: Self-pay | Admitting: Neurology

## 2019-01-19 NOTE — Telephone Encounter (Signed)
Spoke to patient.  States he is starting having some intermittent low back pain that has not been a problem in a while.  He used to have Tramadol to take, as needed, for his aches and pain.  He is requesting a small supply be sent to [facility] for him to have on hand.  Says he uses it sparingly.

## 2019-01-19 NOTE — Telephone Encounter (Signed)
Pt states last night he experienced pain that he hasn't experienced in a very long time.  Pt is asking if  Tramadol can be called into [facility]

## 2019-02-20 ENCOUNTER — Other Ambulatory Visit: Payer: Self-pay | Admitting: Neurology

## 2019-02-20 ENCOUNTER — Other Ambulatory Visit: Payer: Self-pay | Admitting: Nurse Practitioner

## 2019-02-20 DIAGNOSIS — I152 Hypertension secondary to endocrine disorders: Secondary | ICD-10-CM

## 2019-02-20 DIAGNOSIS — G629 Polyneuropathy, unspecified: Secondary | ICD-10-CM

## 2019-02-20 DIAGNOSIS — E1151 Type 2 diabetes mellitus with diabetic peripheral angiopathy without gangrene: Secondary | ICD-10-CM

## 2019-02-20 DIAGNOSIS — K219 Gastro-esophageal reflux disease without esophagitis: Secondary | ICD-10-CM

## 2019-03-06 ENCOUNTER — Telehealth: Payer: Self-pay | Admitting: Neurology

## 2019-03-06 DIAGNOSIS — G35 Multiple sclerosis: Secondary | ICD-10-CM

## 2019-03-06 DIAGNOSIS — M4800 Spinal stenosis, site unspecified: Secondary | ICD-10-CM

## 2019-03-06 DIAGNOSIS — R269 Unspecified abnormalities of gait and mobility: Secondary | ICD-10-CM

## 2019-03-06 DIAGNOSIS — R531 Weakness: Secondary | ICD-10-CM

## 2019-03-06 NOTE — Telephone Encounter (Signed)
I have put in order for physical therapy

## 2019-03-06 NOTE — Telephone Encounter (Signed)
Returned pts call from voicemail, pt states he would like to start physical therapy, he states he is unable to wal without his crutches

## 2019-03-06 NOTE — Telephone Encounter (Signed)
The patient is aware to expect a call to be scheduled for physical therapy.

## 2019-03-13 ENCOUNTER — Ambulatory Visit: Payer: Self-pay | Admitting: Neurology

## 2019-03-20 ENCOUNTER — Telehealth: Payer: Self-pay

## 2019-03-20 NOTE — Telephone Encounter (Signed)
I spoke to the patient who states his feet have been swollen for several days.  This is a new symptom for him.  He is going to contact his PCP this morning in order for this new issue to be physically evaluated.

## 2019-03-20 NOTE — Telephone Encounter (Signed)
Patient stated that his feet have been swollen for several days. When asked he stated that he does elevate his feet but nothing seems to help. He would like to know is there something he could for this. Please advise.

## 2019-03-23 ENCOUNTER — Telehealth: Payer: Self-pay | Admitting: Nurse Practitioner

## 2019-03-23 NOTE — Addendum Note (Signed)
Addended by: Noberto Retort C on: 03/23/2019 09:20 AM   Modules accepted: Orders

## 2019-03-23 NOTE — Telephone Encounter (Signed)
PT orders were placed in Epic on 03/06/2019.  The patient has ambulatory issues and needs in home therapy.  Per vo by Dr. Krista Blue, okay to place new orders in Epic.

## 2019-03-23 NOTE — Telephone Encounter (Signed)
Pt called stating that he was wanting In Home Therapy not just regular therapy. Please advise.

## 2019-03-23 NOTE — Telephone Encounter (Signed)
Questions for Screening COVID-19  Symptom onset: n/a  Travel or Contacts: no  During this illness, did/does the patient experience any of the following symptoms? Fever >100.32F []   Yes [x]   No []   Unknown Subjective fever (felt feverish) []   Yes [x]   No []   Unknown Chills []   Yes [x]   No []   Unknown Muscle aches (myalgia) []   Yes [x]   No []   Unknown Runny nose (rhinorrhea) []   Yes [x]   No []   Unknown Sore throat []   Yes [x]   No []   Unknown Cough (new onset or worsening of chronic cough) []   Yes [x]   No []   Unknown Shortness of breath (dyspnea) []   Yes [x]   No []   Unknown Nausea or vomiting []   Yes [x]   No []   Unknown Headache []   Yes [x]   No []   Unknown Abdominal pain  []   Yes [x]   No []   Unknown Diarrhea (?3 loose/looser than normal stools/24hr period) []   Yes [x]   No []   Unknown Other, specify:  Pt will have his daughter here with him tomorrow due to wheelchair use. Daughter denied any symptoms.

## 2019-03-24 ENCOUNTER — Encounter: Payer: Self-pay | Admitting: Nurse Practitioner

## 2019-03-24 ENCOUNTER — Other Ambulatory Visit: Payer: Medicare Other

## 2019-03-24 ENCOUNTER — Ambulatory Visit (INDEPENDENT_AMBULATORY_CARE_PROVIDER_SITE_OTHER): Payer: Medicare Other | Admitting: Nurse Practitioner

## 2019-03-24 ENCOUNTER — Other Ambulatory Visit: Payer: Self-pay

## 2019-03-24 VITALS — BP 132/68 | HR 78 | Temp 97.8°F | Ht 67.0 in | Wt 161.2 lb

## 2019-03-24 DIAGNOSIS — M4800 Spinal stenosis, site unspecified: Secondary | ICD-10-CM

## 2019-03-24 DIAGNOSIS — E1151 Type 2 diabetes mellitus with diabetic peripheral angiopathy without gangrene: Secondary | ICD-10-CM

## 2019-03-24 DIAGNOSIS — R600 Localized edema: Secondary | ICD-10-CM | POA: Diagnosis not present

## 2019-03-24 DIAGNOSIS — G629 Polyneuropathy, unspecified: Secondary | ICD-10-CM

## 2019-03-24 DIAGNOSIS — E1142 Type 2 diabetes mellitus with diabetic polyneuropathy: Secondary | ICD-10-CM

## 2019-03-24 DIAGNOSIS — I152 Hypertension secondary to endocrine disorders: Secondary | ICD-10-CM | POA: Diagnosis not present

## 2019-03-24 DIAGNOSIS — K219 Gastro-esophageal reflux disease without esophagitis: Secondary | ICD-10-CM

## 2019-03-24 DIAGNOSIS — N3281 Overactive bladder: Secondary | ICD-10-CM

## 2019-03-24 DIAGNOSIS — Z23 Encounter for immunization: Secondary | ICD-10-CM

## 2019-03-24 DIAGNOSIS — D509 Iron deficiency anemia, unspecified: Secondary | ICD-10-CM

## 2019-03-24 DIAGNOSIS — N401 Enlarged prostate with lower urinary tract symptoms: Secondary | ICD-10-CM

## 2019-03-24 DIAGNOSIS — R269 Unspecified abnormalities of gait and mobility: Secondary | ICD-10-CM

## 2019-03-24 LAB — TSH: TSH: 1.46 u[IU]/mL (ref 0.35–4.50)

## 2019-03-24 LAB — RENAL FUNCTION PANEL
Albumin: 4.4 g/dL (ref 3.5–5.2)
BUN: 21 mg/dL (ref 6–23)
CO2: 30 mEq/L (ref 19–32)
Calcium: 9.6 mg/dL (ref 8.4–10.5)
Chloride: 102 mEq/L (ref 96–112)
Creatinine, Ser: 0.9 mg/dL (ref 0.40–1.50)
GFR: 83.27 mL/min (ref >60.00)
Glucose, Bld: 104 mg/dL — ABNORMAL HIGH (ref 70–99)
Phosphorus: 3 mg/dL (ref 2.3–4.6)
Potassium: 3.7 mEq/L (ref 3.5–5.1)
Sodium: 140 mEq/L (ref 135–145)

## 2019-03-24 LAB — HEPATIC FUNCTION PANEL
ALT: 10 U/L (ref 0–53)
AST: 21 U/L (ref 0–37)
Albumin: 4.4 g/dL (ref 3.5–5.2)
Alkaline Phosphatase: 66 U/L (ref 39–117)
Bilirubin, Direct: 0.1 mg/dL (ref 0.0–0.3)
Total Bilirubin: 0.4 mg/dL (ref 0.2–1.2)
Total Protein: 6.4 g/dL (ref 6.0–8.3)

## 2019-03-24 LAB — HEMOGLOBIN A1C: Hgb A1c MFr Bld: 5.9 % (ref 4.6–6.5)

## 2019-03-24 LAB — BRAIN NATRIURETIC PEPTIDE: Pro B Natriuretic peptide (BNP): 33 pg/mL (ref 0.0–100.0)

## 2019-03-24 NOTE — Progress Notes (Signed)
Subjective:  Patient ID: Larry [name], male    DOB: Apr 22, 1951  Age: 68 y.o. MRN: [id number]  CC: Follow-up (follow up DM/BP/leg is not better--unable to move around like before. lab? fish oil can cause leg pain?)  HPI Accompanied by his daughter. She is concerned about worsening LE edema. She starts he has been using increased sodium intake by useing salt shaker even after food is prepared with salt. Larry. [name] denies any PND or cough or SOB with activity or chest pain or ABD fullness or nocturia.  HTN: Stable with use of clonidine, olmesartan, HCTZ, and amlodipine. BP Readings from Last 3 Encounters:  03/24/19 132/68  12/12/18 (!) 129/57  08/08/18 (!) 161/82   DM: Controlled with actos and metformin. Last hgbA1c of 6.1 Have chronic LE neuropathy. Positive urine microalbumin, current use of ARB. LDL not at goal: 101, no statin used at this time.  Reviewed past Medical, Social and Family history today.  Outpatient Medications Prior to Visit  Medication Sig Dispense Refill   ACCU-CHEK AVIVA PLUS test strip TEST ONCE DAILY (MORNING  FASTING) 100 each 3   Accu-Chek FastClix Lancets MISC      aspirin 81 MG tablet Take 81 mg by mouth daily.     baclofen (LIORESAL) 20 MG tablet TAKE 1 TABLET BY MOUTH 3  TIMES DAILY 270 tablet 3   Blood Glucose Calibration (ACCU-CHEK AVIVA) SOLN      Incontinence Supply Disposable (DEPEND ADJUSTABLE UNDERWEAR LG) MISC Use as needed. 30 each 12   Lancets Misc. (ACCU-CHEK FASTCLIX LANCET) KIT 1 Units by Does not apply route daily before breakfast. 1 kit 11   meloxicam (MOBIC) 15 MG tablet TAKE 1 TABLET BY MOUTH  DAILY AS NEEDED FOR PAIN 90 tablet 3   Multiple Vitamin (MULTIVITAMIN) tablet Take 1 tablet by mouth daily.     Olmesartan-amLODIPine-HCTZ 40-10-12.5 MG TABS TAKE 1 TABLET BY MOUTH  DAILY 90 tablet 3   traZODone (DESYREL) 50 MG tablet TAKE 1 TABLET BY MOUTH AT  BEDTIME 90 tablet 3   cloNIDine (CATAPRES) 0.1 MG tablet Take 1  tablet (0.1 mg total) by mouth 2 (two) times daily. 180 tablet 1   ferrous sulfate 325 (65 FE) MG tablet Take 1 tablet (325 mg total) by mouth daily with breakfast. 90 tablet 3   finasteride (PROSCAR) 5 MG tablet Take 1 tablet (5 mg total) by mouth daily. 90 tablet 1   metFORMIN (GLUCOPHAGE) 1000 MG tablet TAKE 1 TABLET BY MOUTH  TWICE DAILY WITH A MEAL. 180 tablet 1   omeprazole (PRILOSEC) 20 MG capsule TAKE 1 CAPSULE BY MOUTH  DAILY 90 capsule 1   oxybutynin (DITROPAN-XL) 5 MG 24 hr tablet Take 1 tablet (5 mg total) by mouth at bedtime. 30 tablet 5   pioglitazone (ACTOS) 30 MG tablet TAKE 1 TABLET BY MOUTH  DAILY 90 tablet 1   pregabalin (LYRICA) 75 MG capsule TAKE 1 CAPSULE BY MOUTH  TWICE DAILY 180 capsule 1   Cyanocobalamin (VITAMIN B-12 PO) Take 1 tablet by mouth 2 (two) times daily. Patient is unsure of dose     No facility-administered medications prior to visit.     ROS See HPI  Objective:  BP 132/68    Pulse 78    Temp 97.8 F (36.6 C) (Tympanic)    Ht _0  (1.702 m)    Wt 161 lb 3.2 oz (73.1 kg)    SpO2 96%    BMI 25.25 kg/m   BP Readings from Last  3 Encounters:  03/24/19 132/68  12/12/18 (!) 129/57  08/08/18 (!) 161/82    Wt Readings from Last 3 Encounters:  03/24/19 161 lb 3.2 oz (73.1 kg)  08/08/18 172 lb (78 kg)  06/23/18 173 lb 8 oz (78.7 kg)    Physical Exam Cardiovascular:     Rate and Rhythm: Normal rate and regular rhythm.     Pulses: Normal pulses.     Heart sounds: Normal heart sounds.  Pulmonary:     Effort: Pulmonary effort is normal.     Breath sounds: Normal breath sounds.  Musculoskeletal:        General: No tenderness.     Right lower leg: Edema present.     Left lower leg: Edema present.     Comments: Bilateral LE pitting edema,  non ambulatory:wheelchair  Skin:    General: Skin is dry.     Findings: No erythema.  Neurological:     Mental Status: He is alert and oriented to person, place, and time.    Lab Results  Component  Value Date   WBC 3.3 (L) 06/23/2018   HGB 11.1 (L) 06/23/2018   HCT 32.1 (L) 06/23/2018   PLT 241 06/23/2018   GLUCOSE 104 (H) 03/24/2019   CHOL 169 06/10/2018   TRIG 81.0 06/10/2018   HDL 51.90 06/10/2018   LDLCALC 101 (H) 06/10/2018   ALT 10 03/24/2019   AST 21 03/24/2019   NA 140 03/24/2019   K 3.7 03/24/2019   CL 102 03/24/2019   CREATININE 0.90 03/24/2019   BUN 21 03/24/2019   CO2 30 03/24/2019   TSH 1.46 03/24/2019   PSA 0.52 06/10/2018   INR 1.01 02/16/2011   HGBA1C 5.9 03/24/2019   MICROALBUR 4.7 (H) 07/27/2016    Larry [name]  Result Date: 07/10/2018 [facility] [address] [place], [place] [address] [phone] NEUROIMAGING REPORT STUDY DATE: 07/08/2018 PATIENT NAME: Larry [name] DOB: 06-08-1950 MRN: [id number] EXAM: MRI of the thoracic spine ORDERING CLINICIAN: Marcial Pacas MD, PhD CLINICAL HISTORY: 68 year old man with weakness COMPARISON FILMS: MRI 12/11/2011 TECHNIQUE: MRI of the thoracic spine was obtained utilizing 3 mm sagittal slices from the posterior fossa from C7 to L1 level with T1, T2 and inversion recovery views. In addition 4 mm axial slices from T0W4 to O97D5 level were included with T2 and gradient echo views. [name]: None IMAGING SITE: [facility], [place], [place], [place] FINDINGS: :  On scout images, the spine is imaged from above the cervicomedullary junction to the lower thoracic spine.  The spinal cord is of normal caliber.   There is increased signal within the spinal cord to the left adjacent to T10-T11 consistent with myelomalacia.   There appears to have been prior posterior decompression from T9-T11.  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   The vertebral bodies have normal signal.  The discs and interspaces were further evaluated on axial views from C7 to L1. C7-T1: There is mild spinal stenosis due to broad disc  protrusion, facet hypertrophy and uncovertebral spurring and ligamenta flava hypertrophy.  There is moderate right greater than left foraminal narrowing but no definite nerve root compression. T1-T2: There is disc protrusion, facet hypertrophy and ligamenta flava hypertrophy.  The central canal is narrowed but not enough to be considered spinal stenosis.  There is moderate severe right and moderate left foraminal narrowing with potential for right T1 nerve root  compression. T2-T3: There is left greater than right disc protrusion, facet hypertrophy and ligamenta flava hypertrophy.  These combine to cause moderately severe foraminal narrowing there is some potential for T2 nerve root compression. T3-T4: The disc and interspace appear normal. T4-T5: The disc appears normal though there is mild facet hypertrophy.  There is no nerve root compression or spinal stenosis. T5-T6: The disc and interspace are normal. T6-T7: The disc and interspace appear normal. 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  There is anterior wedging.  There is no nerve root compression or spinal stenosis. T8-T9: There is mild disc bulging and severe left ligamenta flava hypertrophy.  This indents the thecal sac without causing spinal cord compression.  There is moderately severe left foraminal narrowing with possible left T8. T9-T10: The disc appears normal.  There is mild facet hypertrophy but no nerve root compression. T10-T11: There is moderate spinal stenosis due to disc protrusion, severe facet hypertrophy and severe left greater than right ligamenta flava hypertrophy.  There is bilateral severe foraminal narrowing with probable bilateral T10 nerve root compression.  Additional note is made of increased signal within the spinal cord to the left. T11-T12: There is disc protrusion, facet hypertrophy and ligamenta flava hypertrophy.  The central canal is mildly narrowed but not enough to be considered spinal stenosis.  There is no  moderately severe right and moderate left foraminal narrowing with potential for right T11 nerve root compression. T12-L1: The disc appears normal.  There is facet hypertrophy.  No nerve root compression or spinal stenosis. The study was compared to the MRI dated 12/11/2011.  In the interim, there has been progression of degenerative changes at T1-T2, T2-T3, T8-T9 and T11-T12.  There is slight progression of degenerative changes at T10-T11 and stable degenerative changes at the other levels.   This MRI of the thoracic spine without [name] shows the following: 1.   At T1-T2, there is possible right T1 nerve root compression 2.   At T2-T3, there is possible bilateral T2 nerve root compression. 3.   At T8-T9, there are severe degenerative changes more to the left that could lead to left T8 nerve root compression. 4.   At T10-T11 there is moderate spinal stenosis and bilateral severe foraminal narrowing with probable bilateral T10 nerve root compression. 5.   At T11-T12, there are severe degenerative changes.  The central canal is narrowed but not enough to be considered spinal stenosis.  There is possible right T11 nerve root compression. 6.   Degenerative changes at other levels as detailed above do not lead to spinal stenosis or nerve root compression. 7.   Fusion, likely congenital, of the T7 and T8 vertebral bodies. 8.   Abnormal spinal cord signal adjacent to T10-T11, towards the left, and evidence of T9-T11 decompressive surgery in the past, also present on the 2013 MRI. INTERPRETING PHYSICIAN: Richard A. Felecia Shelling, MD, PhD, FAAN Certified in  Neuroimaging by [employer]   Larry [name]  Result Date: 07/10/2018  [facility] [address] [place], [place] [address] [phone] NEUROIMAGING REPORT STUDY DATE: 07/08/2018 PATIENT NAME: Larry [name] DOB: Mar 13, 1951 MRN: [id number] EXAM: MRI of the lumbar spine without [name] ORDERING CLINICIAN: Marcial Pacas  MD, PhD CLINICAL HISTORY: 68 year old man with weakness COMPARISON FILMS: 08/26/2015 TECHNIQUE: MRI of the lumbar spine was obtained utilizing 4 mm sagittal slices from W43-15 down to the lower sacrum with T1, T2 and inversion recovery views. In addition 4 mm axial  slices from Z6-1 down to L5-S1 level were included with T1 and T2 weighted views. [name]: None IMAGING SITE: [facility], [address] [place], [place], [place] FINDINGS: On sagittal images, the spine is imaged from T11 to the sacrum.   The conus medullaris and cauda equine appear normal.   The vertebral bodies are normally aligned.   The L5 vertebral body is sacralized.  Postoperative changes consistent with PLIF at L4-L5 that has occurred since the 2017 MRI. The discs and interspaces were further evaluated on axial views from T12to S1 as follows: T12-L1: Bilateral facet hypertrophy.  The disc appears normal.  There is no spinal stenosis or nerve root compression. L1-L2: Bilateral facet hypertrophy.  The disc appears normal.  There is no spinal stenosis or nerve root compression. L2-L3: Disc bulging and bilateral facet hypertrophy with left ligamenta flava hypertrophy.  There is mild spinal stenosis and mild bilateral foraminal narrowing but no nerve root compression. L3-L4: Moderately severe spinal stenosis due to right paramedian disc herniation, facet hypertrophy and ligamenta flava hypertrophy.  There is moderate bilateral foraminal narrowing, severe right lateral recess stenosis and moderate left lateral recess stenosis.  There is probable right L4 nerve root compression.  This level has markedly progressed when compared to the 2017 MRI. L4-L5: This level has been decompressed and fused since the 2017 MRI.Marland Kitchen  The central canal is decompressed and there is no longer foraminal narrowing or nerve root compression. L5-S1: This level is fused either congenitally or degenerative really.  There are facet degenerative changes.  Mild bilateral foraminal  narrowing but no nerve root compression.   This MRI of the lumbar spine shows the following: 1.    At L2-L3, there is mild stable spinal stenosis but no nerve root compression. 2.    At L3-L4, there is moderately severe spinal stenosis and severe right lateral recess stenosis causing probable right L4 nerve root compression.  This level has markedly progressed when compared to the 2017 MRI. 3.    PLIF at L4-L5 that has occurred since the 08/26/2015 MRI.  There is decompression that has resolved the spinal stenosis and foraminal narrowing present on the previous MRI. 4.    Fusion at L5-S1.  This could be congenital or degenerative and appears unchanged compared to the 2017 MRI. INTERPRETING PHYSICIAN: Richard A. Felecia Shelling, MD, PhD, FAAN Certified in  Neuroimaging by [employer]    Assessment & Plan:   Larry [name] was seen today for follow-up.  Diagnoses and all orders for this visit:  Gait abnormality -     For home use only DME 4 wheeled rolling walker with seat ([id number])  Need for immunization against influenza -     Flu Vaccine QUAD High Dose(Fluad)  Spinal stenosis, unspecified spinal region  Type 2 diabetes mellitus with diabetic polyneuropathy, without long-term current use of insulin (HCC) -     Hepatic function panel -     Renal Function Panel -     Hemoglobin A1c  Hypertension due to endocrine disorder -     Renal Function Panel -     cloNIDine (CATAPRES) 0.1 MG tablet; Take 1 tablet (0.1 mg total) by mouth 2 (two) times daily.  Bilateral leg edema -     Renal Function Panel -     TSH -     Brain natriuretic peptide -     furosemide (LASIX) 20 MG tablet; Take 1 tablet (20 mg total) by mouth daily. -     potassium chloride SA (  KLOR-CON) 20 MEQ tablet; Take 1 tablet (20 mEq total) by mouth daily.  Peripheral polyneuropathy -     cloNIDine (CATAPRES) 0.1 MG tablet; Take 1 tablet (0.1 mg total) by mouth 2 (two) times daily. -     pregabalin (LYRICA) 75 MG  capsule; Take 1 capsule (75 mg total) by mouth 2 (two) times daily.  Benign prostatic hyperplasia with lower urinary tract symptoms, symptom details unspecified -     finasteride (PROSCAR) 5 MG tablet; Take 1 tablet (5 mg total) by mouth daily. -     oxybutynin (DITROPAN-XL) 5 MG 24 hr tablet; Take 1 tablet (5 mg total) by mouth at bedtime.  Iron deficiency anemia, unspecified iron deficiency anemia type -     ferrous sulfate 325 (65 FE) MG tablet; Take 1 tablet (325 mg total) by mouth daily with breakfast.  Controlled type 2 DM with peripheral circulatory disorder (HCC) -     metFORMIN (GLUCOPHAGE) 850 MG tablet; Take 1 tablet (850 mg total) by mouth 2 (two) times daily with a meal. -     pioglitazone (ACTOS) 15 MG tablet; Take 1 tablet (15 mg total) by mouth daily.  Gastroesophageal reflux disease without esophagitis -     omeprazole (PRILOSEC) 20 MG capsule; Take 1 capsule (20 mg total) by mouth daily.  Overactive bladder -     oxybutynin (DITROPAN-XL) 5 MG 24 hr tablet; Take 1 tablet (5 mg total) by mouth at bedtime.   I have changed Larry [name]'s metFORMIN, omeprazole, pioglitazone, and pregabalin. I am also having him start on furosemide and potassium chloride SA. Additionally, I am having him maintain his multivitamin, aspirin, Accu-Chek Aviva, Cyanocobalamin (VITAMIN B-12 PO), Depend Adjustable Underwear Lg, Accu-Chek FastClix Lancet, traZODone, Accu-Chek Aviva Plus, Accu-Chek FastClix Lancets, Olmesartan-amLODIPine-HCTZ, baclofen, meloxicam, cloNIDine, finasteride, ferrous sulfate, and oxybutynin.  Meds ordered this encounter  Medications   furosemide (LASIX) 20 MG tablet    Sig: Take 1 tablet (20 mg total) by mouth daily.    Dispense:  5 tablet    Refill:  0    Order Specific Question:   Supervising Provider    Answer:   Lucille Passy [[id number]]   potassium chloride SA (KLOR-CON) 20 MEQ tablet    Sig: Take 1 tablet (20 mEq total) by mouth daily.    Dispense:  5 tablet     Refill:  0    Order Specific Question:   Supervising Provider    Answer:   Lucille Passy [[id number]]   cloNIDine (CATAPRES) 0.1 MG tablet    Sig: Take 1 tablet (0.1 mg total) by mouth 2 (two) times daily.    Dispense:  180 tablet    Refill:  3    Order Specific Question:   Supervising Provider    Answer:   Lucille Passy [[id number]]   finasteride (PROSCAR) 5 MG tablet    Sig: Take 1 tablet (5 mg total) by mouth daily.    Dispense:  90 tablet    Refill:  3    Order Specific Question:   Supervising Provider    Answer:   Lucille Passy [[id number]]   ferrous sulfate 325 (65 FE) MG tablet    Sig: Take 1 tablet (325 mg total) by mouth daily with breakfast.    Dispense:  90 tablet    Refill:  3    Order Specific Question:   Supervising Provider    Answer:   Lucille Passy [[id number]]   metFORMIN (GLUCOPHAGE) 850 MG  tablet    Sig: Take 1 tablet (850 mg total) by mouth 2 (two) times daily with a meal.    Dispense:  180 tablet    Refill:  1    Change in dose    Order Specific Question:   Supervising Provider    Answer:   Lucille Passy [[id number]]   omeprazole (PRILOSEC) 20 MG capsule    Sig: Take 1 capsule (20 mg total) by mouth daily.    Dispense:  90 capsule    Refill:  3    Requesting 1 year supply    Order Specific Question:   Supervising Provider    Answer:   Lucille Passy [[id number]]   oxybutynin (DITROPAN-XL) 5 MG 24 hr tablet    Sig: Take 1 tablet (5 mg total) by mouth at bedtime.    Dispense:  30 tablet    Refill:  5    Stop flomax    Order Specific Question:   Supervising Provider    Answer:   Lucille Passy [[id number]]   pioglitazone (ACTOS) 15 MG tablet    Sig: Take 1 tablet (15 mg total) by mouth daily.    Dispense:  90 tablet    Refill:  1    Change in dose    Order Specific Question:   Supervising Provider    Answer:   Lucille Passy [[id number]]   pregabalin (LYRICA) 75 MG capsule    Sig: Take 1 capsule (75 mg total) by mouth 2 (two) times daily.    Dispense:  180 capsule    Refill:  1    Order  Specific Question:   Supervising Provider    Answer:   Lucille Passy [[id number]]    Problem List Items Addressed This Visit      Cardiovascular and Mediastinum   Hypertension    Stable BP with clonidine, olmesartan, amlodipine and HCTZ BP Readings from Last 3 Encounters:  03/24/19 132/68  12/12/18 (!) 129/57  08/08/18 (!) 161/82   LE edema due to increase sodium intake and sleeping with legs dangling down. Furosemide and potassium sent x 5days. Normal BNP and renal function. F/up in 442month      Relevant Medications   furosemide (LASIX) 20 MG tablet   cloNIDine (CATAPRES) 0.1 MG tablet   Other Relevant Orders   Renal Function Panel (Completed)     Digestive   GERD (gastroesophageal reflux disease)   Relevant Medications   omeprazole (PRILOSEC) 20 MG capsule     Endocrine   DM (diabetes mellitus) ([facility]    Controlled with HgbA1c of 5.9 Decreased metformin dose to 8528mand actos to 1550m/up in 42mo1month   Relevant Medications   metFORMIN (GLUCOPHAGE) 850 MG tablet   pioglitazone (ACTOS) 15 MG tablet   Other Relevant Orders   Hepatic function panel (Completed)   Renal Function Panel (Completed)   Hemoglobin A1c (Completed)     Nervous and Auditory   Peripheral neuropathy   Relevant Medications   cloNIDine (CATAPRES) 0.1 MG tablet   pregabalin (LYRICA) 75 MG capsule     Genitourinary   Overactive bladder   Relevant Medications   oxybutynin (DITROPAN-XL) 5 MG 24 hr tablet     Other   Gait abnormality - Primary   Relevant Orders   For home use only DME 4 wheeled rolling walker with seat (DME([id number] deficiency anemia   Relevant Medications   ferrous sulfate 325 (65 FE) MG tablet  Spinal stenosis    Other Visit Diagnoses    Need for immunization against influenza       Relevant Orders   Flu Vaccine QUAD High Dose(Fluad) (Completed)   Bilateral leg edema       Relevant Medications   furosemide (LASIX) 20 MG tablet   potassium chloride SA (KLOR-CON)  20 MEQ tablet   Other Relevant Orders   Renal Function Panel (Completed)   TSH (Completed)   Brain natriuretic peptide (Completed)   Benign prostatic hyperplasia with lower urinary tract symptoms, symptom details unspecified       Relevant Medications   finasteride (PROSCAR) 5 MG tablet   oxybutynin (DITROPAN-XL) 5 MG 24 hr tablet   Controlled type 2 DM with peripheral circulatory disorder (HCC)       Relevant Medications   furosemide (LASIX) 20 MG tablet   cloNIDine (CATAPRES) 0.1 MG tablet   metFORMIN (GLUCOPHAGE) 850 MG tablet   pioglitazone (ACTOS) 15 MG tablet      Follow-up: Return in about 4 weeks (around 04/21/2019) for HTN and LE edema.  Wilfred Lacy, NP

## 2019-03-27 ENCOUNTER — Encounter: Payer: Self-pay | Admitting: Nurse Practitioner

## 2019-03-27 ENCOUNTER — Telehealth: Payer: Self-pay | Admitting: Neurology

## 2019-03-27 MED ORDER — CLONIDINE HCL 0.1 MG PO TABS: 0.1000 mg | ORAL_TABLET | Freq: Two times a day (BID) | ORAL | 3 refills | Status: DC

## 2019-03-27 MED ORDER — PREGABALIN 75 MG PO CAPS: 75.0000 mg | ORAL_CAPSULE | Freq: Two times a day (BID) | ORAL | 1 refills | Status: DC

## 2019-03-27 MED ORDER — POTASSIUM CHLORIDE CRYS ER 20 MEQ PO TBCR: 20.0000 meq | EXTENDED_RELEASE_TABLET | Freq: Every day | ORAL | 0 refills | Status: DC

## 2019-03-27 MED ORDER — FINASTERIDE 5 MG PO TABS: 5.0000 mg | ORAL_TABLET | Freq: Every day | ORAL | 3 refills | Status: DC

## 2019-03-27 MED ORDER — PIOGLITAZONE HCL 15 MG PO TABS: 15.0000 mg | ORAL_TABLET | Freq: Every day | ORAL | 1 refills | Status: DC

## 2019-03-27 MED ORDER — OXYBUTYNIN CHLORIDE ER 5 MG PO TB24: 5.0000 mg | ORAL_TABLET | Freq: Every day | ORAL | 5 refills | Status: DC

## 2019-03-27 MED ORDER — OMEPRAZOLE 20 MG PO CPDR: 20.0000 mg | DELAYED_RELEASE_CAPSULE | Freq: Every day | ORAL | 3 refills | Status: DC

## 2019-03-27 MED ORDER — METFORMIN HCL 850 MG PO TABS: 850.0000 mg | ORAL_TABLET | Freq: Two times a day (BID) | ORAL | 1 refills | Status: DC

## 2019-03-27 MED ORDER — FUROSEMIDE 20 MG PO TABS: 20.0000 mg | ORAL_TABLET | Freq: Every day | ORAL | 0 refills | Status: DC

## 2019-03-27 MED ORDER — FERROUS SULFATE 325 (65 FE) MG PO TABS: 325.0000 mg | ORAL_TABLET | Freq: Every day | ORAL | 3 refills | Status: DC

## 2019-03-27 NOTE — Assessment & Plan Note (Addendum)
Stable BP with clonidine, olmesartan, amlodipine and HCTZ BP Readings from Last 3 Encounters:  03/24/19 132/68  12/12/18 (!) 129/57  08/08/18 (!) 161/82   Wt Readings from Last 3 Encounters:  03/24/19 161 lb 3.2 oz (73.1 kg)  08/08/18 172 lb (78 kg)  06/23/18 173 lb 8 oz (78.7 kg)   LE edema due to increase sodium intake and sleeping with legs dangling down. Furosemide and potassium sent x 5days. Normal BNP, albumin, total protein, and renal function. Advised about importance of DASH diet and elevated legs when sitting or in supine position. He is unable to use compression stocking (lives alone, UE weakness) F/up in 80month.

## 2019-03-27 NOTE — Assessment & Plan Note (Signed)
Controlled with HgbA1c of 5.9 Decreased metformin dose to 850mg  and actos to 15mg  F/up in 41months

## 2019-03-27 NOTE — Telephone Encounter (Signed)
Fax cover sheet , Order , Insurance card last note . Put patient in Epic on cover sheet . Send to [facility] [phone] - fax [phone] . Thanks Hinton Dyer.

## 2019-03-27 NOTE — Telephone Encounter (Signed)
Orders sent noted

## 2019-03-27 NOTE — Telephone Encounter (Signed)
[facility].

## 2019-03-28 NOTE — Telephone Encounter (Signed)
Margaret Dealer at [facility] stating they are declining the referral due to the lack of nurses.

## 2019-03-28 NOTE — Telephone Encounter (Signed)
Sent to [facility] to see if they can take patient .

## 2019-03-31 DIAGNOSIS — M6281 Muscle weakness (generalized): Secondary | ICD-10-CM | POA: Diagnosis not present

## 2019-04-05 ENCOUNTER — Encounter: Payer: Self-pay | Admitting: Nurse Practitioner

## 2019-04-05 ENCOUNTER — Telehealth: Payer: Self-pay | Admitting: Nurse Practitioner

## 2019-04-05 NOTE — Telephone Encounter (Signed)
Pt requesting letter stating that he is unable to take stairs due to his health condition. Pt is moving from his current 2 story home and needs this to show the landlord.     Copied from [place] [phone]. Topic: General - Other >> Apr 05, 2019  1:27 PM Celene Kras A wrote: Reason for CRM: Pt called stating he is needing a letter stating he is not able to take the stairs. Please advise.

## 2019-04-05 NOTE — Telephone Encounter (Signed)
Pt is aware, he going to have his friend or his daughter pick it up tomorrow.

## 2019-04-05 NOTE — Progress Notes (Signed)
Letter written

## 2019-04-05 NOTE — Telephone Encounter (Signed)
Letter written

## 2019-04-06 NOTE — Telephone Encounter (Signed)
[facility] declined Patient because of staffing . I have sent to [facility] to see if they can take patient. Patient is aware of details.

## 2019-04-07 ENCOUNTER — Telehealth: Payer: Self-pay | Admitting: Nurse Practitioner

## 2019-04-07 NOTE — Telephone Encounter (Signed)
Larry [name]--    Copied from [place] [phone]. Topic: General - Other >> Apr 07, 2019  3:50 PM Larry [name] wrote: Reason for CRM: Larry [name] with [facility] called to inform Larry [name] that they will be seeing patient starting on Tuesday 04/11/2019

## 2019-04-08 DIAGNOSIS — G35 Multiple sclerosis: Secondary | ICD-10-CM | POA: Diagnosis not present

## 2019-04-08 DIAGNOSIS — D509 Iron deficiency anemia, unspecified: Secondary | ICD-10-CM | POA: Diagnosis not present

## 2019-04-08 DIAGNOSIS — M4800 Spinal stenosis, site unspecified: Secondary | ICD-10-CM | POA: Diagnosis not present

## 2019-04-08 DIAGNOSIS — I10 Essential (primary) hypertension: Secondary | ICD-10-CM | POA: Diagnosis not present

## 2019-04-08 DIAGNOSIS — Z7984 Long term (current) use of oral hypoglycemic drugs: Secondary | ICD-10-CM | POA: Diagnosis not present

## 2019-04-08 DIAGNOSIS — K219 Gastro-esophageal reflux disease without esophagitis: Secondary | ICD-10-CM | POA: Diagnosis not present

## 2019-04-08 DIAGNOSIS — E1142 Type 2 diabetes mellitus with diabetic polyneuropathy: Secondary | ICD-10-CM | POA: Diagnosis not present

## 2019-04-08 DIAGNOSIS — N3281 Overactive bladder: Secondary | ICD-10-CM | POA: Diagnosis not present

## 2019-04-08 DIAGNOSIS — Z7982 Long term (current) use of aspirin: Secondary | ICD-10-CM | POA: Diagnosis not present

## 2019-04-08 DIAGNOSIS — Z9181 History of falling: Secondary | ICD-10-CM | POA: Diagnosis not present

## 2019-04-10 ENCOUNTER — Telehealth: Payer: Self-pay | Admitting: Nurse Practitioner

## 2019-04-10 DIAGNOSIS — R600 Localized edema: Secondary | ICD-10-CM

## 2019-04-10 NOTE — Telephone Encounter (Signed)
Charlotte please advise, ok for verbal order  Pt suppose to be on metformin 850 mg 1 tab 2 times daily--this changed on 03/27/2019.

## 2019-04-10 NOTE — Telephone Encounter (Signed)
Home Health Verbal Orders - Caller/Agency: Estill Bamberg - [facility] Number: [phone] (can leave voicemail) Requesting OT/PT/Skilled Nursing/Social Work/Speech Therapy: RN Frequency:  1x a week for 4 weeks for cardio pulmonary 1 every 2 weeks for 5 weeks after that.  Still had swelling in legs. 3+ in both legs on Saturday 04/08/2019. States that he was on the last day of lasix and potassium that was prescribed. Said that lasix did not kick in until day 3 and was concerned about that since leg swelling still had not gone down. Was needing clarification on metformin dose. Still taking 1000mg , but she thought it was changed to 850MG  2x a day. Please advise

## 2019-04-10 NOTE — Telephone Encounter (Signed)
Jasper with home health orders.  What is his weight at home? Has he been legs elevated as discussed during last OV?

## 2019-04-10 NOTE — Telephone Encounter (Signed)
Larry [name] is aware of okey for verbal order from Nche and metformin 850 mg started in 03/27/2019.  LVM for the pt to call back, need to see if he elevated his legs as discussed and make sure taking metformin 850 mg.

## 2019-04-11 ENCOUNTER — Telehealth: Payer: Self-pay | Admitting: Nurse Practitioner

## 2019-04-11 DIAGNOSIS — G35 Multiple sclerosis: Secondary | ICD-10-CM | POA: Diagnosis not present

## 2019-04-11 DIAGNOSIS — I10 Essential (primary) hypertension: Secondary | ICD-10-CM | POA: Diagnosis not present

## 2019-04-11 DIAGNOSIS — Z7984 Long term (current) use of oral hypoglycemic drugs: Secondary | ICD-10-CM | POA: Diagnosis not present

## 2019-04-11 DIAGNOSIS — Z7982 Long term (current) use of aspirin: Secondary | ICD-10-CM | POA: Diagnosis not present

## 2019-04-11 DIAGNOSIS — Z9181 History of falling: Secondary | ICD-10-CM | POA: Diagnosis not present

## 2019-04-11 DIAGNOSIS — M4800 Spinal stenosis, site unspecified: Secondary | ICD-10-CM | POA: Diagnosis not present

## 2019-04-11 DIAGNOSIS — K219 Gastro-esophageal reflux disease without esophagitis: Secondary | ICD-10-CM | POA: Diagnosis not present

## 2019-04-11 DIAGNOSIS — E1142 Type 2 diabetes mellitus with diabetic polyneuropathy: Secondary | ICD-10-CM | POA: Diagnosis not present

## 2019-04-11 DIAGNOSIS — D509 Iron deficiency anemia, unspecified: Secondary | ICD-10-CM | POA: Diagnosis not present

## 2019-04-11 DIAGNOSIS — N3281 Overactive bladder: Secondary | ICD-10-CM | POA: Diagnosis not present

## 2019-04-11 MED ORDER — FUROSEMIDE 20 MG PO TABS: 20.0000 mg | ORAL_TABLET | Freq: Every day | ORAL | 0 refills | Status: DC

## 2019-04-11 MED ORDER — POTASSIUM CHLORIDE CRYS ER 20 MEQ PO TBCR: 20.0000 meq | EXTENDED_RELEASE_TABLET | Freq: Every day | ORAL | 0 refills | Status: DC

## 2019-04-11 NOTE — Telephone Encounter (Signed)
Pt stated his leg still swelling even the use of lasix and elevated his leg as directed. Pt report weight 160 lb as of 2 days ago. Pt stated he thinks she is taking metformin 1000 mg; I advised the pt he needs to be on 850 mg 1 tab 2 times daily and we sent new Rx to [facility]. Pt stated he will check the bottle and if he didn't get 850 mg he will call me back.

## 2019-04-11 NOTE — Telephone Encounter (Signed)
Patient is returning a call to [place].  Tried the office but she was with a patient.  Please call to discuss at [phone]

## 2019-04-11 NOTE — Telephone Encounter (Signed)
LVM for Estill Bamberg to call back, have addition order from [facility] to give the nurse.

## 2019-04-11 NOTE — Telephone Encounter (Signed)
Ok, skilled nursing should also help with wearing compressing stocking during the day and off at night

## 2019-04-11 NOTE — Telephone Encounter (Signed)
LVM for the Cindee Salt to call back.

## 2019-04-11 NOTE — Telephone Encounter (Signed)
Larry [name] please advise 

## 2019-04-11 NOTE — Telephone Encounter (Signed)
Home Health Verbal Orders - Caller/Agency: Evelina Dun ([facility] [phone])  Requesting OT/PT/Skilled Nursing/Social Work/Speech Therapy: PT Frequency: 2x3wk, 1x3wk

## 2019-04-11 NOTE — Telephone Encounter (Signed)
Sent another furosemide prescription. Have home nurse to help wear compression stocking during the day and off at night. Also need to maintain low sodium diet.

## 2019-04-11 NOTE — Telephone Encounter (Signed)
LVM for the pt to call back.

## 2019-04-11 NOTE — Telephone Encounter (Signed)
Left another vm for pt to call back,

## 2019-04-11 NOTE — Telephone Encounter (Signed)
Pt is aware of [employer]. Pt stated he has a lot of metformin 1000 mg left (not 850 mg). I will call [facility] rx tomorrow to check on this.   Macie Burows

## 2019-04-12 NOTE — Telephone Encounter (Signed)
Home health nurse [facility] needs clarification on the nursing part of the request.  Best call [phone]

## 2019-04-12 NOTE — Telephone Encounter (Signed)
LVM for PT to call back.

## 2019-04-12 NOTE — Telephone Encounter (Signed)
Larry [name] is aware.

## 2019-04-12 NOTE — Telephone Encounter (Signed)
Unable to leave vm.

## 2019-04-12 NOTE — Telephone Encounter (Signed)
Pt is aware that metformine 850 mg 1 tab 2 times daily will arrive tomorrow before lunch time.  Pt will used 4 bottles of metformin 1000 mg 1 tab daily until finish--per [place]  Left vm for Amanda--HH nurse--to call back--need to inform them about the compression stocking.

## 2019-04-19 DIAGNOSIS — G35 Multiple sclerosis: Secondary | ICD-10-CM | POA: Diagnosis not present

## 2019-04-19 DIAGNOSIS — M4800 Spinal stenosis, site unspecified: Secondary | ICD-10-CM | POA: Diagnosis not present

## 2019-04-19 DIAGNOSIS — K219 Gastro-esophageal reflux disease without esophagitis: Secondary | ICD-10-CM | POA: Diagnosis not present

## 2019-04-19 DIAGNOSIS — I10 Essential (primary) hypertension: Secondary | ICD-10-CM | POA: Diagnosis not present

## 2019-04-19 DIAGNOSIS — N3281 Overactive bladder: Secondary | ICD-10-CM | POA: Diagnosis not present

## 2019-04-19 DIAGNOSIS — D509 Iron deficiency anemia, unspecified: Secondary | ICD-10-CM | POA: Diagnosis not present

## 2019-04-19 DIAGNOSIS — Z7984 Long term (current) use of oral hypoglycemic drugs: Secondary | ICD-10-CM | POA: Diagnosis not present

## 2019-04-19 DIAGNOSIS — E1142 Type 2 diabetes mellitus with diabetic polyneuropathy: Secondary | ICD-10-CM | POA: Diagnosis not present

## 2019-04-19 DIAGNOSIS — Z7982 Long term (current) use of aspirin: Secondary | ICD-10-CM | POA: Diagnosis not present

## 2019-04-19 DIAGNOSIS — Z9181 History of falling: Secondary | ICD-10-CM | POA: Diagnosis not present

## 2019-04-20 DIAGNOSIS — D509 Iron deficiency anemia, unspecified: Secondary | ICD-10-CM | POA: Diagnosis not present

## 2019-04-20 DIAGNOSIS — K219 Gastro-esophageal reflux disease without esophagitis: Secondary | ICD-10-CM | POA: Diagnosis not present

## 2019-04-20 DIAGNOSIS — E1142 Type 2 diabetes mellitus with diabetic polyneuropathy: Secondary | ICD-10-CM | POA: Diagnosis not present

## 2019-04-20 DIAGNOSIS — N3281 Overactive bladder: Secondary | ICD-10-CM | POA: Diagnosis not present

## 2019-04-20 DIAGNOSIS — I10 Essential (primary) hypertension: Secondary | ICD-10-CM | POA: Diagnosis not present

## 2019-04-20 DIAGNOSIS — G35 Multiple sclerosis: Secondary | ICD-10-CM | POA: Diagnosis not present

## 2019-04-20 DIAGNOSIS — Z7984 Long term (current) use of oral hypoglycemic drugs: Secondary | ICD-10-CM | POA: Diagnosis not present

## 2019-04-20 DIAGNOSIS — M4800 Spinal stenosis, site unspecified: Secondary | ICD-10-CM | POA: Diagnosis not present

## 2019-04-20 DIAGNOSIS — Z7982 Long term (current) use of aspirin: Secondary | ICD-10-CM | POA: Diagnosis not present

## 2019-04-20 DIAGNOSIS — Z9181 History of falling: Secondary | ICD-10-CM | POA: Diagnosis not present

## 2019-04-21 DIAGNOSIS — Z7984 Long term (current) use of oral hypoglycemic drugs: Secondary | ICD-10-CM | POA: Diagnosis not present

## 2019-04-21 DIAGNOSIS — Z7982 Long term (current) use of aspirin: Secondary | ICD-10-CM | POA: Diagnosis not present

## 2019-04-21 DIAGNOSIS — K219 Gastro-esophageal reflux disease without esophagitis: Secondary | ICD-10-CM | POA: Diagnosis not present

## 2019-04-21 DIAGNOSIS — D509 Iron deficiency anemia, unspecified: Secondary | ICD-10-CM | POA: Diagnosis not present

## 2019-04-21 DIAGNOSIS — M4800 Spinal stenosis, site unspecified: Secondary | ICD-10-CM | POA: Diagnosis not present

## 2019-04-21 DIAGNOSIS — Z9181 History of falling: Secondary | ICD-10-CM | POA: Diagnosis not present

## 2019-04-21 DIAGNOSIS — G35 Multiple sclerosis: Secondary | ICD-10-CM | POA: Diagnosis not present

## 2019-04-21 DIAGNOSIS — N3281 Overactive bladder: Secondary | ICD-10-CM | POA: Diagnosis not present

## 2019-04-21 DIAGNOSIS — E1142 Type 2 diabetes mellitus with diabetic polyneuropathy: Secondary | ICD-10-CM | POA: Diagnosis not present

## 2019-04-21 DIAGNOSIS — I10 Essential (primary) hypertension: Secondary | ICD-10-CM | POA: Diagnosis not present

## 2019-04-25 DIAGNOSIS — K219 Gastro-esophageal reflux disease without esophagitis: Secondary | ICD-10-CM | POA: Diagnosis not present

## 2019-04-25 DIAGNOSIS — M4800 Spinal stenosis, site unspecified: Secondary | ICD-10-CM | POA: Diagnosis not present

## 2019-04-25 DIAGNOSIS — E1142 Type 2 diabetes mellitus with diabetic polyneuropathy: Secondary | ICD-10-CM | POA: Diagnosis not present

## 2019-04-25 DIAGNOSIS — Z9181 History of falling: Secondary | ICD-10-CM | POA: Diagnosis not present

## 2019-04-25 DIAGNOSIS — G35 Multiple sclerosis: Secondary | ICD-10-CM | POA: Diagnosis not present

## 2019-04-25 DIAGNOSIS — N3281 Overactive bladder: Secondary | ICD-10-CM | POA: Diagnosis not present

## 2019-04-25 DIAGNOSIS — D509 Iron deficiency anemia, unspecified: Secondary | ICD-10-CM | POA: Diagnosis not present

## 2019-04-25 DIAGNOSIS — Z7982 Long term (current) use of aspirin: Secondary | ICD-10-CM | POA: Diagnosis not present

## 2019-04-25 DIAGNOSIS — I10 Essential (primary) hypertension: Secondary | ICD-10-CM | POA: Diagnosis not present

## 2019-04-25 DIAGNOSIS — Z7984 Long term (current) use of oral hypoglycemic drugs: Secondary | ICD-10-CM | POA: Diagnosis not present

## 2019-04-26 NOTE — Telephone Encounter (Signed)
Patient has started  [place] .

## 2019-04-28 DIAGNOSIS — N3281 Overactive bladder: Secondary | ICD-10-CM | POA: Diagnosis not present

## 2019-04-28 DIAGNOSIS — G35 Multiple sclerosis: Secondary | ICD-10-CM | POA: Diagnosis not present

## 2019-04-28 DIAGNOSIS — K219 Gastro-esophageal reflux disease without esophagitis: Secondary | ICD-10-CM | POA: Diagnosis not present

## 2019-04-28 DIAGNOSIS — M4800 Spinal stenosis, site unspecified: Secondary | ICD-10-CM | POA: Diagnosis not present

## 2019-04-28 DIAGNOSIS — Z7982 Long term (current) use of aspirin: Secondary | ICD-10-CM | POA: Diagnosis not present

## 2019-04-28 DIAGNOSIS — I10 Essential (primary) hypertension: Secondary | ICD-10-CM | POA: Diagnosis not present

## 2019-04-28 DIAGNOSIS — E1142 Type 2 diabetes mellitus with diabetic polyneuropathy: Secondary | ICD-10-CM | POA: Diagnosis not present

## 2019-04-28 DIAGNOSIS — Z7984 Long term (current) use of oral hypoglycemic drugs: Secondary | ICD-10-CM | POA: Diagnosis not present

## 2019-04-28 DIAGNOSIS — Z9181 History of falling: Secondary | ICD-10-CM | POA: Diagnosis not present

## 2019-04-28 DIAGNOSIS — D509 Iron deficiency anemia, unspecified: Secondary | ICD-10-CM | POA: Diagnosis not present

## 2019-05-02 DIAGNOSIS — Z7984 Long term (current) use of oral hypoglycemic drugs: Secondary | ICD-10-CM | POA: Diagnosis not present

## 2019-05-02 DIAGNOSIS — Z9181 History of falling: Secondary | ICD-10-CM | POA: Diagnosis not present

## 2019-05-02 DIAGNOSIS — Z7982 Long term (current) use of aspirin: Secondary | ICD-10-CM | POA: Diagnosis not present

## 2019-05-02 DIAGNOSIS — K219 Gastro-esophageal reflux disease without esophagitis: Secondary | ICD-10-CM | POA: Diagnosis not present

## 2019-05-02 DIAGNOSIS — I10 Essential (primary) hypertension: Secondary | ICD-10-CM | POA: Diagnosis not present

## 2019-05-02 DIAGNOSIS — D509 Iron deficiency anemia, unspecified: Secondary | ICD-10-CM | POA: Diagnosis not present

## 2019-05-02 DIAGNOSIS — G35 Multiple sclerosis: Secondary | ICD-10-CM | POA: Diagnosis not present

## 2019-05-02 DIAGNOSIS — E1142 Type 2 diabetes mellitus with diabetic polyneuropathy: Secondary | ICD-10-CM | POA: Diagnosis not present

## 2019-05-02 DIAGNOSIS — N3281 Overactive bladder: Secondary | ICD-10-CM | POA: Diagnosis not present

## 2019-05-02 DIAGNOSIS — M4800 Spinal stenosis, site unspecified: Secondary | ICD-10-CM | POA: Diagnosis not present

## 2019-05-08 DIAGNOSIS — I10 Essential (primary) hypertension: Secondary | ICD-10-CM | POA: Diagnosis not present

## 2019-05-08 DIAGNOSIS — Z7984 Long term (current) use of oral hypoglycemic drugs: Secondary | ICD-10-CM | POA: Diagnosis not present

## 2019-05-08 DIAGNOSIS — G35 Multiple sclerosis: Secondary | ICD-10-CM | POA: Diagnosis not present

## 2019-05-08 DIAGNOSIS — M4800 Spinal stenosis, site unspecified: Secondary | ICD-10-CM | POA: Diagnosis not present

## 2019-05-08 DIAGNOSIS — E1142 Type 2 diabetes mellitus with diabetic polyneuropathy: Secondary | ICD-10-CM | POA: Diagnosis not present

## 2019-05-08 DIAGNOSIS — Z7982 Long term (current) use of aspirin: Secondary | ICD-10-CM | POA: Diagnosis not present

## 2019-05-08 DIAGNOSIS — Z9181 History of falling: Secondary | ICD-10-CM | POA: Diagnosis not present

## 2019-05-08 DIAGNOSIS — K219 Gastro-esophageal reflux disease without esophagitis: Secondary | ICD-10-CM | POA: Diagnosis not present

## 2019-05-08 DIAGNOSIS — N3281 Overactive bladder: Secondary | ICD-10-CM | POA: Diagnosis not present

## 2019-05-08 DIAGNOSIS — D509 Iron deficiency anemia, unspecified: Secondary | ICD-10-CM | POA: Diagnosis not present

## 2019-05-09 ENCOUNTER — Encounter: Payer: Self-pay | Admitting: Neurology

## 2019-05-09 ENCOUNTER — Ambulatory Visit: Payer: Medicare Other | Admitting: Neurology

## 2019-05-09 ENCOUNTER — Other Ambulatory Visit: Payer: Self-pay

## 2019-05-09 VITALS — BP 129/71 | HR 62 | Temp 97.8°F

## 2019-05-09 DIAGNOSIS — M4804 Spinal stenosis, thoracic region: Secondary | ICD-10-CM

## 2019-05-09 DIAGNOSIS — M48061 Spinal stenosis, lumbar region without neurogenic claudication: Secondary | ICD-10-CM

## 2019-05-09 DIAGNOSIS — R531 Weakness: Secondary | ICD-10-CM

## 2019-05-09 DIAGNOSIS — R269 Unspecified abnormalities of gait and mobility: Secondary | ICD-10-CM | POA: Diagnosis not present

## 2019-05-09 DIAGNOSIS — M5416 Radiculopathy, lumbar region: Secondary | ICD-10-CM

## 2019-05-09 DIAGNOSIS — M4802 Spinal stenosis, cervical region: Secondary | ICD-10-CM

## 2019-05-09 NOTE — Progress Notes (Signed)
HISTORY OF PRESENT ILLNESS: Larry [name] is a 68 -year-old male, accompanied by her daughter [name], seen in refer by his primary care physician Dr. Rodena Goldmann for evaluation of worsening gait difficulty, multiple sclerosis, initial evaluation was on June 12 2015.  He was a patient of our office in the past, last clinical visit was July 2013, he had a history of diabetes since 2002, also with  diagnosis of relapsing remitting multiple sclerosis, initial presentation was gait difficulty in 1996, he fell few times, he was initially diagnosed with lumbar spinal stenosis, had lumbar spine disc decompression surgery, which fail to improve his symptoms, he continued to have worsening gait difficulty, this eventually led to hospital admission at [facility], the diagnosis of MS was based on abnormal MRI findings, spinal fluid testing, after rehabilitation, he was able to ambulate again with a cane, there was no visual loss, he was started treatment with Avonex since 1996, he moved to [place] in 1999, was under the care of Dr. Delphia Grates at [facility], was treated with Betaseron for a short period of time, but he could not tolerate it due to injection side effect, went back on Avelox shortly afterwards  over the years, he denies significant relapsing episode, he had slow decline in his walking, has been ambulate with crutches, exercise regularly  He had a history of lumbar decompression surgery  in 1996 at [place] with very difficult recovery, he was in wheelchair for 6 months because of gait difficulty.  He presented to the hospital in July 2013 for worsening gait difficulty, had extensive imaging study.  I have personally reviewed MRI of the brain in 2013 showed chronic changes, cervical spine with significant cord compression at C4-5 C5-6, but no abnormal cord signal to suggest cervical MS lesions, MRI of thoracic spine showed chronic cord  compression and myelomalacia at T10-11, he was evaluated by neurosurgeon, Dr. Ellene Route, the plan was to treat him with short course of steroid, followed by thoracic elective decompression, he has made significant improvement with steroid treatment, has lost follow-up,  Over the past 3 years, he had worsening gait difficulty, he is able to walk with crutches, but with worsening bending forward posture and difficulty, recent few months, he also noticed bilateral hands paresthesia. he lives in his own apartment, has 12 steps,  he denies significant low back pain, no bowel and bladder incontinence, he exercise 3 times each week,  UPDATE April 6th 2017: He was evaluated by Dr. Ellene Route in early March 2017, per patient, he was offered decompression surgery for cervical and thoracic stenosis, he also had MRI of lumbar He is now complaining of worsening right low back pain, radiating pain to right hip and right thigh,   He also began to notice bilateral hands numbness weakness since 2003, gradually getting worse, is very difficult for him to button his shirt  I have personally reviewed MRI of lumbar in March 2017: Multilevel degenerative changes, most severe at L4-5 multifactorial severe spinal stenosis lateral recess stenosis. Marked foraminal narrowing greater on the right  UPDATE Mar 03 2016:  He had L4-5 decompressive laminectomy, decompression of  L4-5 nerve roots, posterior lumbar interbody arthrodeses with autograft and allograft by Dr. Ellene Route on December 30 2015, he responded very well, he no longer has significant right low back pain radiating to his right hip, his posture has improved, he can move better,  However since surgery, he noticed increasing incident of bowel incontinence, couple times each  week, he denies significant problem with his bladder  We again reviewed presurgical MRI in March 2017, multilevel lumbar degenerative disc disease, L4-5 severe spinal stenosis, lateral recess stenosis,  marked foraminal narrowing greater on the right, MRI of thoracic spine moderate to severe spinal stenosis at T10-11, severe bilateral foraminal stenosis, T2 hyper intensity cord signal, facet hypertrophy at T9-10, with severe bilateral foraminal stenosis, MRI of cervical spine, Mild spinal canal stenosis severe bilateral foraminal stenosis at C4-5, C5-6 and C6-7   UPDATE Mar 09 2017: He has no low back pain, bu he complains of feet cold all the time. He has regular bowel movement. Still has occasionally bowel incontinence, urinary urgency, was seen by urologist in the past  UPDATE Mar 22 2018: He has more difficulty walking, weaker in his hands, he could not lift his leg against gravity any more, his bowel and bladder issue has much improved,   He is going to have manual wheelchair soon.  UPDATE Jun 23 2018: He came in today consider worsening bilateral lower extremity weakness, increased difficulty ambulating using his upper body strength to drag bilateral lower extremity weight,  We again personally reviewed MRIs in 2017, MRI of lumbar showed severe spinal stenosis L4-5, with marked right foraminal narrowing, he had underwent lumbar decompression surgery since,  MRI of thoracic spine T10-11 multifactorial prominent spinal stenosis and cord flattening, chronic increased signal, consistent with myelomalacia,  MRI of cervical spine, multilevel degenerative changes, with mild to moderate canal stenosis, multilevel severe foraminal narrowing, especially C7-T1  UPDATE August 08 2018: We have personally reviewed the repeat MRIs of neuroaxis, MRI of brain in Jan 2020: Multiple T2/flair hyperintensity in subcortical white matter, less likely it is account for his worsening gait abnormality, most likely represent chronic microvascular ischemic changes, less likely to be demyelinating disease.  MRI of cervical spine showed moderate spinal stenosis at C4-5, C5-6, C6-7, due to degenerative changes, the  extent of spinal stenosis has mildly progressed compared to 2017, potential nerve root compression at left C7  MRI of thoracic spine showed moderate spinal stenosis at T10-11, bilateral severe foraminal narrowing with probable bilateral T10 nerve root compression,  MRI of lumbar moderately severe spinal stenosis, at L3-4, severe right lateral recess stenosis, PLIF at L4-5,  He has profound bilateral lower extremity weakness, proximal more than distal, hyperreflexia of bilateral upper extremity, bilateral hand intrinsic muscle atrophy, relatively preserved bilateral upper extremity proximal muscle weakness, above findings suggest that his thoracic, cervical spinal stenosis likely contributed to his progressive worsening bilateral lower extremity weakness, gait abnormality.  Laboratory evaluation showed normal B12, TSH, CBC showed anemia with hemoglobin of 11.1  UPDATE May 09 2019: He is accompanied by his daughter at visit. He had profound bilateral leg weakness, depend on wheelchair now, denies significant worsening bowel and bladder incontinence.  He also noticed mild worsening weakness of bilateral hands, but no significant weakness of proximal muscles.  We have personally reviewed MRI of brain: Multiple T2/flair hyperintense foci predominantly in the subcortical and deep white matter of the hemispheres.  A few foci are also noted in the periventricular white matter though none are in the septal callosal fibers.  Compared to the MRI dated 12/11/2011, there is no significant change.  These most likely represent chronic microvascular ischemic changes.  Demyelination would be less likely with this pattern.  MRI lumbar:  L3-L4, there is moderately severe spinal stenosis and severe right lateral recess stenosis causing probable right L4 nerve root compression.  This level has  markedly progressed when compared to the 2017 MRI.  PLIF at L4-L5 that has occurred since the 08/26/2015 MRI.  There is  decompression that has resolved the spinal stenosis and foraminal narrowing present on the previous MRI.  Fusion at L5-S1.  This could be congenital or degenerative and appears unchanged compared to the 2017 MRI.  MRI of the thoracic spine without contrast shows the following:  At T10-T11 there is moderate spinal stenosis and bilateral severe foraminal narrowing with probable bilateral T10 nerve root compression.  At T11-T12, there are severe degenerative changes.  The central canal is narrowed but not enough to be considered spinal stenosis.  There is possible right T11 nerve root compression.  MRI of the cervical spine without contrast shows the following:  Moderate spinal stenosis at C4-C5, C5-C6 and C6-C7 due to degenerative changes.  The extent of spinal stenosis has mildly progressed when compared to the 2017 MRI at these levels.  At C6-C7, there is potential for left C7 nerve root compression.   REVIEW OF SYSTEMS: Out of a complete 14 system review of symptoms, the patient complains only of the following symptoms, and all other reviewed systems are negative. As above.  ALLERGIES: Allergies  Allergen Reactions   No Known Allergies     HOME MEDICATIONS: Outpatient Medications Prior to Visit  Medication Sig Dispense Refill   ACCU-CHEK AVIVA PLUS test strip TEST ONCE DAILY (MORNING  FASTING) 100 each 3   Accu-Chek FastClix Lancets MISC      aspirin 81 MG tablet Take 81 mg by mouth daily.     baclofen (LIORESAL) 20 MG tablet TAKE 1 TABLET BY MOUTH 3  TIMES DAILY 270 tablet 3   Blood Glucose Calibration (ACCU-CHEK AVIVA) SOLN      cloNIDine (CATAPRES) 0.1 MG tablet Take 1 tablet (0.1 mg total) by mouth 2 (two) times daily. 180 tablet 3   Cyanocobalamin (VITAMIN B-12 PO) Take 1 tablet by mouth 2 (two) times daily. Patient is unsure of dose     ferrous sulfate 325 (65 FE) MG tablet Take 1 tablet (325 mg total) by mouth daily with breakfast. 90 tablet 3   finasteride (PROSCAR) 5  MG tablet Take 1 tablet (5 mg total) by mouth daily. 90 tablet 3   furosemide (LASIX) 20 MG tablet Take 1 tablet (20 mg total) by mouth daily. 7 tablet 0   Incontinence Supply Disposable (DEPEND ADJUSTABLE UNDERWEAR LG) MISC Use as needed. 30 each 12   Lancets Misc. (ACCU-CHEK FASTCLIX LANCET) KIT 1 Units by Does not apply route daily before breakfast. 1 kit 11   meloxicam (MOBIC) 15 MG tablet TAKE 1 TABLET BY MOUTH  DAILY AS NEEDED FOR PAIN 90 tablet 3   metFORMIN (GLUCOPHAGE) 850 MG tablet Take 1 tablet (850 mg total) by mouth 2 (two) times daily with a meal. 180 tablet 1   Multiple Vitamin (MULTIVITAMIN) tablet Take 1 tablet by mouth daily.     Olmesartan-amLODIPine-HCTZ 40-10-12.5 MG TABS TAKE 1 TABLET BY MOUTH  DAILY 90 tablet 3   omeprazole (PRILOSEC) 20 MG capsule Take 1 capsule (20 mg total) by mouth daily. 90 capsule 3   oxybutynin (DITROPAN-XL) 5 MG 24 hr tablet Take 1 tablet (5 mg total) by mouth at bedtime. 30 tablet 5   pioglitazone (ACTOS) 15 MG tablet Take 1 tablet (15 mg total) by mouth daily. 90 tablet 1   potassium chloride SA (KLOR-CON) 20 MEQ tablet Take 1 tablet (20 mEq total) by mouth daily. 7 tablet 0  pregabalin (LYRICA) 75 MG capsule Take 1 capsule (75 mg total) by mouth 2 (two) times daily. 180 capsule 1   traZODone (DESYREL) 50 MG tablet TAKE 1 TABLET BY MOUTH AT  BEDTIME 90 tablet 3   No facility-administered medications prior to visit.     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   hx surgery 1996 with bilat gait abnormality since    PAST SURGICAL HISTORY: Past Surgical History:  Procedure Laterality Date   BACK  SURGERY     COLONOSCOPY     LUMBAR LAMINECTOMY      FAMILY HISTORY: Family History  Problem Relation Age of Onset   Hypertension Mother    Liver cancer Father    Hypertension Sister     SOCIAL HISTORY: Social History   Socioeconomic History   Marital status: Legally Separated    Spouse name: Not on file   Number of children: 1   Years of education: 13   Highest education level: Not on file  Occupational History   Occupation: Disability  Social Designer, fashion/clothing strain: Not on file   Food insecurity    Worry: Not on file    Inability: Not on file   Transportation needs    Medical: Not on file    Non-medical: Not on file  Tobacco Use   Smoking status: Never Smoker   Smokeless tobacco: Never Used  Substance and Sexual Activity   Alcohol use: No   Drug use: No   Sexual activity: Not Currently  Lifestyle   Physical activity    Days per week: Not on file    Minutes per session: Not on file   Stress: Not on file  Relationships   Social connections    Talks on phone: Not on file    Gets together: Not on file    Attends religious service: Not on file    Active member of club or organization: Not on file    Attends meetings of clubs or organizations: Not on file    Relationship status: Not on file   Intimate partner violence    Fear of current or ex partner: Not on file    Emotionally abused: Not on file    Physically abused: Not on file    Forced sexual activity: Not on file  Other Topics Concern   Not on file  Social History Narrative   Work or [place] alone, feels safe      Spiritual Beliefs: Christian      Lifestyle: works out with friend 3 days per week at the [employer]; diet is poor - likes to [employer] but like unhealthy options      Right-handed.    2 cups caffeine per day.   PHYSICAL EXAM  Vitals:   05/09/19 0853  BP: 129/71  Pulse: 62  Temp: 97.8 F (36.6 C)   There is no height or weight  on file to calculate BMI.   PHYSICAL EXAMNIATION:  Gen: NAD, conversant, well nourised, obese, well groomed  Cardiovascular: Regular rate rhythm, no peripheral edema, warm, nontender. Eyes: Conjunctivae clear without exudates or hemorrhage Neck: Supple, no carotid bruits. Pulmonary: Clear to auscultation bilaterally   NEUROLOGICAL EXAM:  MENTAL STATUS: Speech:    Speech is normal; fluent and spontaneous with normal comprehension.  Cognition:     Orientation to time, place and person     Normal recent and remote memory     Normal Attention span and concentration     Normal Language, naming, repeating,spontaneous speech     Fund of knowledge   CRANIAL NERVES: CN II: Visual fields are full to confrontation.Pupils are round equal and briskly reactive to light. CN III, IV, VI: extraocular movement are normal. No ptosis. CN V: Facial sensation is intact to pinprick in all 3 divisions bilaterally. Corneal responses are intact.  CN VII: Face is symmetric with normal eye closure and smile. CN VIII: Hearing is normal to rubbing fingers CN IX, X: Palate elevates symmetrically. Phonation is normal. CN XI: Head turning and shoulder shrug are intact CN XII: Tongue is midline with normal movements and no atrophy.  MOTOR: Bilateral hand intrinsic muscle atrophy, especially bilateral abductor pollicis brevis, mild bilateral grip, finger abduction weakness. No movement of bilateral lower extremities.  REFLEXES: Reflexes are 1 and symmetric at the biceps, triceps, 3/3 at knees, and absent at ankles.    SENSORY: Decreased vibratory sensation at distal shin level  COORDINATION: Rapid alternating movements and fine finger movements are intact.   GAIT/STANCE: Deferred, wheelchair dependent   DIAGNOSTIC DATA (LABS, IMAGING, TESTING) - I reviewed patient records, labs, notes, testing and imaging myself where available.  Lab Results  Component Value Date   WBC 3.3 (L)  06/23/2018   HGB 11.1 (L) 06/23/2018   HCT 32.1 (L) 06/23/2018   MCV 94 06/23/2018   PLT 241 06/23/2018      Component Value Date/Time   NA 140 03/24/2019 1555   NA 144 01/04/2014 0943   K 3.7 03/24/2019 1555   K 3.8 01/04/2014 0943   CL 102 03/24/2019 1555   CO2 30 03/24/2019 1555   CO2 28 01/04/2014 0943   GLUCOSE 104 (H) 03/24/2019 1555   GLUCOSE 113 01/04/2014 0943   BUN 21 03/24/2019 1555   BUN 18.1 01/04/2014 0943   CREATININE 0.90 03/24/2019 1555   CREATININE 1.0 01/04/2014 0943   CALCIUM 9.6 03/24/2019 1555   CALCIUM 10.2 01/04/2014 0943   PROT 6.4 03/24/2019 1555   PROT 7.4 01/04/2014 0943   ALBUMIN 4.4 03/24/2019 1555   ALBUMIN 4.4 03/24/2019 1555   ALBUMIN 4.6 01/04/2014 0943   AST 21 03/24/2019 1555   AST 30 01/04/2014 0943   ALT 10 03/24/2019 1555   ALT 17 01/04/2014 0943   ALKPHOS 66 03/24/2019 1555   ALKPHOS 64 01/04/2014 0943   BILITOT 0.4 03/24/2019 1555   BILITOT 0.41 01/04/2014 0943   GFRNONAA >60 01/03/2016 0517   GFRNONAA >89 12/25/2011 1132   GFRAA >60 01/03/2016 0517   GFRAA >89 12/25/2011 1132   Lab Results  Component Value Date   CHOL 169 06/10/2018   HDL 51.90 06/10/2018   LDLCALC 101 (H) 06/10/2018   TRIG 81.0 06/10/2018   CHOLHDL 3 06/10/2018   Lab Results  Component Value Date   HGBA1C 5.9 03/24/2019   Lab Results  Component Value Date   VITAMINB12 375 06/23/2018   Lab Results  Component Value Date   TSH 1.46 03/24/2019      ASSESSMENT AND PLAN 68 y.o. year old  male   Worsening weakness of bilateral lower extremity  His gait difficulty are combination of cervical myelopathy, radiculopathy, thoracic myelopathy,  lumbar radiculopathy,   Refer him to Dr. Ellene Route for potential surgical decompression    Marcial Pacas, M.D. Ph.D.  [facility] [place], [place] [address] Phone: [phone] Fax:      [phone]

## 2019-05-10 DIAGNOSIS — K219 Gastro-esophageal reflux disease without esophagitis: Secondary | ICD-10-CM | POA: Diagnosis not present

## 2019-05-10 DIAGNOSIS — I10 Essential (primary) hypertension: Secondary | ICD-10-CM | POA: Diagnosis not present

## 2019-05-10 DIAGNOSIS — Z7984 Long term (current) use of oral hypoglycemic drugs: Secondary | ICD-10-CM | POA: Diagnosis not present

## 2019-05-10 DIAGNOSIS — Z7982 Long term (current) use of aspirin: Secondary | ICD-10-CM | POA: Diagnosis not present

## 2019-05-10 DIAGNOSIS — M4800 Spinal stenosis, site unspecified: Secondary | ICD-10-CM | POA: Diagnosis not present

## 2019-05-10 DIAGNOSIS — Z9181 History of falling: Secondary | ICD-10-CM | POA: Diagnosis not present

## 2019-05-10 DIAGNOSIS — N3281 Overactive bladder: Secondary | ICD-10-CM | POA: Diagnosis not present

## 2019-05-10 DIAGNOSIS — E1142 Type 2 diabetes mellitus with diabetic polyneuropathy: Secondary | ICD-10-CM | POA: Diagnosis not present

## 2019-05-10 DIAGNOSIS — D509 Iron deficiency anemia, unspecified: Secondary | ICD-10-CM | POA: Diagnosis not present

## 2019-05-10 DIAGNOSIS — G35 Multiple sclerosis: Secondary | ICD-10-CM | POA: Diagnosis not present

## 2019-05-16 DIAGNOSIS — D509 Iron deficiency anemia, unspecified: Secondary | ICD-10-CM | POA: Diagnosis not present

## 2019-05-16 DIAGNOSIS — N3281 Overactive bladder: Secondary | ICD-10-CM | POA: Diagnosis not present

## 2019-05-16 DIAGNOSIS — Z7984 Long term (current) use of oral hypoglycemic drugs: Secondary | ICD-10-CM | POA: Diagnosis not present

## 2019-05-16 DIAGNOSIS — K219 Gastro-esophageal reflux disease without esophagitis: Secondary | ICD-10-CM | POA: Diagnosis not present

## 2019-05-16 DIAGNOSIS — Z7982 Long term (current) use of aspirin: Secondary | ICD-10-CM | POA: Diagnosis not present

## 2019-05-16 DIAGNOSIS — M4800 Spinal stenosis, site unspecified: Secondary | ICD-10-CM | POA: Diagnosis not present

## 2019-05-16 DIAGNOSIS — I10 Essential (primary) hypertension: Secondary | ICD-10-CM | POA: Diagnosis not present

## 2019-05-16 DIAGNOSIS — Z9181 History of falling: Secondary | ICD-10-CM | POA: Diagnosis not present

## 2019-05-16 DIAGNOSIS — G35 Multiple sclerosis: Secondary | ICD-10-CM | POA: Diagnosis not present

## 2019-05-16 DIAGNOSIS — E1142 Type 2 diabetes mellitus with diabetic polyneuropathy: Secondary | ICD-10-CM | POA: Diagnosis not present

## 2019-05-24 DIAGNOSIS — I10 Essential (primary) hypertension: Secondary | ICD-10-CM | POA: Diagnosis not present

## 2019-05-24 DIAGNOSIS — Z7982 Long term (current) use of aspirin: Secondary | ICD-10-CM | POA: Diagnosis not present

## 2019-05-24 DIAGNOSIS — N3281 Overactive bladder: Secondary | ICD-10-CM | POA: Diagnosis not present

## 2019-05-24 DIAGNOSIS — M4800 Spinal stenosis, site unspecified: Secondary | ICD-10-CM | POA: Diagnosis not present

## 2019-05-24 DIAGNOSIS — E1142 Type 2 diabetes mellitus with diabetic polyneuropathy: Secondary | ICD-10-CM | POA: Diagnosis not present

## 2019-05-24 DIAGNOSIS — D509 Iron deficiency anemia, unspecified: Secondary | ICD-10-CM | POA: Diagnosis not present

## 2019-05-24 DIAGNOSIS — Z9181 History of falling: Secondary | ICD-10-CM | POA: Diagnosis not present

## 2019-05-24 DIAGNOSIS — G35 Multiple sclerosis: Secondary | ICD-10-CM | POA: Diagnosis not present

## 2019-05-24 DIAGNOSIS — Z7984 Long term (current) use of oral hypoglycemic drugs: Secondary | ICD-10-CM | POA: Diagnosis not present

## 2019-05-24 DIAGNOSIS — K219 Gastro-esophageal reflux disease without esophagitis: Secondary | ICD-10-CM | POA: Diagnosis not present

## 2019-05-25 DIAGNOSIS — D509 Iron deficiency anemia, unspecified: Secondary | ICD-10-CM | POA: Diagnosis not present

## 2019-05-25 DIAGNOSIS — G35 Multiple sclerosis: Secondary | ICD-10-CM | POA: Diagnosis not present

## 2019-05-25 DIAGNOSIS — Z7984 Long term (current) use of oral hypoglycemic drugs: Secondary | ICD-10-CM | POA: Diagnosis not present

## 2019-05-25 DIAGNOSIS — Z7982 Long term (current) use of aspirin: Secondary | ICD-10-CM | POA: Diagnosis not present

## 2019-05-25 DIAGNOSIS — E1142 Type 2 diabetes mellitus with diabetic polyneuropathy: Secondary | ICD-10-CM | POA: Diagnosis not present

## 2019-05-25 DIAGNOSIS — N3281 Overactive bladder: Secondary | ICD-10-CM | POA: Diagnosis not present

## 2019-05-25 DIAGNOSIS — M4800 Spinal stenosis, site unspecified: Secondary | ICD-10-CM | POA: Diagnosis not present

## 2019-05-25 DIAGNOSIS — I10 Essential (primary) hypertension: Secondary | ICD-10-CM | POA: Diagnosis not present

## 2019-05-25 DIAGNOSIS — Z9181 History of falling: Secondary | ICD-10-CM | POA: Diagnosis not present

## 2019-05-25 DIAGNOSIS — K219 Gastro-esophageal reflux disease without esophagitis: Secondary | ICD-10-CM | POA: Diagnosis not present

## 2019-05-30 DIAGNOSIS — Z7984 Long term (current) use of oral hypoglycemic drugs: Secondary | ICD-10-CM | POA: Diagnosis not present

## 2019-05-30 DIAGNOSIS — N3281 Overactive bladder: Secondary | ICD-10-CM | POA: Diagnosis not present

## 2019-05-30 DIAGNOSIS — M4800 Spinal stenosis, site unspecified: Secondary | ICD-10-CM | POA: Diagnosis not present

## 2019-05-30 DIAGNOSIS — D509 Iron deficiency anemia, unspecified: Secondary | ICD-10-CM | POA: Diagnosis not present

## 2019-05-30 DIAGNOSIS — Z7982 Long term (current) use of aspirin: Secondary | ICD-10-CM | POA: Diagnosis not present

## 2019-05-30 DIAGNOSIS — G35 Multiple sclerosis: Secondary | ICD-10-CM | POA: Diagnosis not present

## 2019-05-30 DIAGNOSIS — I10 Essential (primary) hypertension: Secondary | ICD-10-CM | POA: Diagnosis not present

## 2019-05-30 DIAGNOSIS — K219 Gastro-esophageal reflux disease without esophagitis: Secondary | ICD-10-CM | POA: Diagnosis not present

## 2019-05-30 DIAGNOSIS — E1142 Type 2 diabetes mellitus with diabetic polyneuropathy: Secondary | ICD-10-CM | POA: Diagnosis not present

## 2019-05-30 DIAGNOSIS — Z9181 History of falling: Secondary | ICD-10-CM | POA: Diagnosis not present

## 2019-06-01 ENCOUNTER — Telehealth: Payer: Self-pay | Admitting: Nurse Practitioner

## 2019-06-01 DIAGNOSIS — M4800 Spinal stenosis, site unspecified: Secondary | ICD-10-CM | POA: Diagnosis not present

## 2019-06-01 DIAGNOSIS — N3281 Overactive bladder: Secondary | ICD-10-CM | POA: Diagnosis not present

## 2019-06-01 DIAGNOSIS — E1142 Type 2 diabetes mellitus with diabetic polyneuropathy: Secondary | ICD-10-CM | POA: Diagnosis not present

## 2019-06-01 DIAGNOSIS — K219 Gastro-esophageal reflux disease without esophagitis: Secondary | ICD-10-CM | POA: Diagnosis not present

## 2019-06-01 DIAGNOSIS — G35 Multiple sclerosis: Secondary | ICD-10-CM | POA: Diagnosis not present

## 2019-06-01 DIAGNOSIS — Z9181 History of falling: Secondary | ICD-10-CM | POA: Diagnosis not present

## 2019-06-01 DIAGNOSIS — Z7984 Long term (current) use of oral hypoglycemic drugs: Secondary | ICD-10-CM | POA: Diagnosis not present

## 2019-06-01 DIAGNOSIS — Z7982 Long term (current) use of aspirin: Secondary | ICD-10-CM | POA: Diagnosis not present

## 2019-06-01 DIAGNOSIS — I10 Essential (primary) hypertension: Secondary | ICD-10-CM | POA: Diagnosis not present

## 2019-06-01 DIAGNOSIS — D509 Iron deficiency anemia, unspecified: Secondary | ICD-10-CM | POA: Diagnosis not present

## 2019-06-01 NOTE — Telephone Encounter (Signed)
Patient is calling to request a letter from [place] to state that he is in a wheelchair and is needing a mailbox at his door. Patient will take to the postal service. Please advise Cb- [phone]

## 2019-06-05 DIAGNOSIS — N3281 Overactive bladder: Secondary | ICD-10-CM | POA: Diagnosis not present

## 2019-06-05 DIAGNOSIS — K219 Gastro-esophageal reflux disease without esophagitis: Secondary | ICD-10-CM | POA: Diagnosis not present

## 2019-06-05 DIAGNOSIS — M4800 Spinal stenosis, site unspecified: Secondary | ICD-10-CM | POA: Diagnosis not present

## 2019-06-05 DIAGNOSIS — Z7984 Long term (current) use of oral hypoglycemic drugs: Secondary | ICD-10-CM | POA: Diagnosis not present

## 2019-06-05 DIAGNOSIS — G35 Multiple sclerosis: Secondary | ICD-10-CM | POA: Diagnosis not present

## 2019-06-05 DIAGNOSIS — D509 Iron deficiency anemia, unspecified: Secondary | ICD-10-CM | POA: Diagnosis not present

## 2019-06-05 DIAGNOSIS — Z7982 Long term (current) use of aspirin: Secondary | ICD-10-CM | POA: Diagnosis not present

## 2019-06-05 DIAGNOSIS — I10 Essential (primary) hypertension: Secondary | ICD-10-CM | POA: Diagnosis not present

## 2019-06-05 DIAGNOSIS — Z9181 History of falling: Secondary | ICD-10-CM | POA: Diagnosis not present

## 2019-06-05 DIAGNOSIS — E1142 Type 2 diabetes mellitus with diabetic polyneuropathy: Secondary | ICD-10-CM | POA: Diagnosis not present

## 2019-06-06 ENCOUNTER — Telehealth: Payer: Self-pay | Admitting: Nurse Practitioner

## 2019-06-06 DIAGNOSIS — Z9181 History of falling: Secondary | ICD-10-CM | POA: Diagnosis not present

## 2019-06-06 DIAGNOSIS — M4800 Spinal stenosis, site unspecified: Secondary | ICD-10-CM | POA: Diagnosis not present

## 2019-06-06 DIAGNOSIS — N3281 Overactive bladder: Secondary | ICD-10-CM | POA: Diagnosis not present

## 2019-06-06 DIAGNOSIS — I10 Essential (primary) hypertension: Secondary | ICD-10-CM | POA: Diagnosis not present

## 2019-06-06 DIAGNOSIS — G35 Multiple sclerosis: Secondary | ICD-10-CM | POA: Diagnosis not present

## 2019-06-06 DIAGNOSIS — Z7984 Long term (current) use of oral hypoglycemic drugs: Secondary | ICD-10-CM | POA: Diagnosis not present

## 2019-06-06 DIAGNOSIS — E1142 Type 2 diabetes mellitus with diabetic polyneuropathy: Secondary | ICD-10-CM | POA: Diagnosis not present

## 2019-06-06 DIAGNOSIS — D509 Iron deficiency anemia, unspecified: Secondary | ICD-10-CM | POA: Diagnosis not present

## 2019-06-06 DIAGNOSIS — K219 Gastro-esophageal reflux disease without esophagitis: Secondary | ICD-10-CM | POA: Diagnosis not present

## 2019-06-06 DIAGNOSIS — Z7982 Long term (current) use of aspirin: Secondary | ICD-10-CM | POA: Diagnosis not present

## 2019-06-06 NOTE — Telephone Encounter (Signed)
Charlotte please advise 

## 2019-06-06 NOTE — Telephone Encounter (Signed)
Pt is aware, he will try to print it off from mychart. Advised the pt to call me back he is not able to.

## 2019-06-06 NOTE — Telephone Encounter (Signed)
Larry [name] from [facility] needs and order for patient. Patient needs an order for PT and OT with [facility] per [place]. Erick can be reached at [phone]

## 2019-06-06 NOTE — Telephone Encounter (Signed)
Left vm for the pt to call back.  

## 2019-06-06 NOTE — Telephone Encounter (Signed)
Letter written

## 2019-06-07 NOTE — Telephone Encounter (Signed)
Order was already placed by Dr. Krista Blue in October. Why is another order needed? May need to direct recertification to Dr. Krista Blue (neurology)

## 2019-06-07 NOTE — Telephone Encounter (Signed)
Left vm for Cindee Salt to call back

## 2019-06-08 NOTE — Telephone Encounter (Signed)
Charlotte please advise,   Larry [name] stating he has been getting order form our office, this is an extension to PT and OT. Some of nursing and PT orders are mix in phone note on 04/11/2019 as well.  Larry [name] stated Dr, Krista Blue might have started the [facility] order but they still report to PCP for more request from what he understand.

## 2019-06-09 NOTE — Telephone Encounter (Signed)
LVM for Cindee Salt to call back

## 2019-06-09 NOTE — Telephone Encounter (Signed)
Gave verbal order to [place]. He verbalized understand.

## 2019-06-09 NOTE — Telephone Encounter (Signed)
Ok to give verbal order.

## 2019-06-12 ENCOUNTER — Telehealth: Payer: Self-pay | Admitting: Nurse Practitioner

## 2019-06-12 DIAGNOSIS — Z0279 Encounter for issue of other medical certificate: Secondary | ICD-10-CM

## 2019-06-12 NOTE — Telephone Encounter (Signed)
Pt brought in accessibility form to be filled out, I gave to [facility]

## 2019-06-12 NOTE — Telephone Encounter (Signed)
Application completed, left detail message for the pt to come pick this up at the front desk M-F prior to 4:30pm.

## 2019-06-14 DIAGNOSIS — E1142 Type 2 diabetes mellitus with diabetic polyneuropathy: Secondary | ICD-10-CM | POA: Diagnosis not present

## 2019-06-14 DIAGNOSIS — Z7984 Long term (current) use of oral hypoglycemic drugs: Secondary | ICD-10-CM | POA: Diagnosis not present

## 2019-06-14 DIAGNOSIS — D509 Iron deficiency anemia, unspecified: Secondary | ICD-10-CM | POA: Diagnosis not present

## 2019-06-14 DIAGNOSIS — K219 Gastro-esophageal reflux disease without esophagitis: Secondary | ICD-10-CM | POA: Diagnosis not present

## 2019-06-14 DIAGNOSIS — G35 Multiple sclerosis: Secondary | ICD-10-CM | POA: Diagnosis not present

## 2019-06-14 DIAGNOSIS — I10 Essential (primary) hypertension: Secondary | ICD-10-CM | POA: Diagnosis not present

## 2019-06-14 DIAGNOSIS — N3281 Overactive bladder: Secondary | ICD-10-CM | POA: Diagnosis not present

## 2019-06-14 DIAGNOSIS — M4800 Spinal stenosis, site unspecified: Secondary | ICD-10-CM | POA: Diagnosis not present

## 2019-06-14 DIAGNOSIS — Z9181 History of falling: Secondary | ICD-10-CM | POA: Diagnosis not present

## 2019-06-16 DIAGNOSIS — Z9181 History of falling: Secondary | ICD-10-CM | POA: Diagnosis not present

## 2019-06-16 DIAGNOSIS — N3281 Overactive bladder: Secondary | ICD-10-CM | POA: Diagnosis not present

## 2019-06-16 DIAGNOSIS — I10 Essential (primary) hypertension: Secondary | ICD-10-CM | POA: Diagnosis not present

## 2019-06-16 DIAGNOSIS — G35 Multiple sclerosis: Secondary | ICD-10-CM | POA: Diagnosis not present

## 2019-06-16 DIAGNOSIS — E1142 Type 2 diabetes mellitus with diabetic polyneuropathy: Secondary | ICD-10-CM | POA: Diagnosis not present

## 2019-06-16 DIAGNOSIS — M4800 Spinal stenosis, site unspecified: Secondary | ICD-10-CM | POA: Diagnosis not present

## 2019-06-16 DIAGNOSIS — D509 Iron deficiency anemia, unspecified: Secondary | ICD-10-CM | POA: Diagnosis not present

## 2019-06-16 DIAGNOSIS — Z7984 Long term (current) use of oral hypoglycemic drugs: Secondary | ICD-10-CM | POA: Diagnosis not present

## 2019-06-16 DIAGNOSIS — K219 Gastro-esophageal reflux disease without esophagitis: Secondary | ICD-10-CM | POA: Diagnosis not present

## 2019-06-20 DIAGNOSIS — N3281 Overactive bladder: Secondary | ICD-10-CM | POA: Diagnosis not present

## 2019-06-20 DIAGNOSIS — M4800 Spinal stenosis, site unspecified: Secondary | ICD-10-CM | POA: Diagnosis not present

## 2019-06-20 DIAGNOSIS — G35 Multiple sclerosis: Secondary | ICD-10-CM | POA: Diagnosis not present

## 2019-06-20 DIAGNOSIS — I10 Essential (primary) hypertension: Secondary | ICD-10-CM | POA: Diagnosis not present

## 2019-06-20 DIAGNOSIS — Z9181 History of falling: Secondary | ICD-10-CM | POA: Diagnosis not present

## 2019-06-20 DIAGNOSIS — K219 Gastro-esophageal reflux disease without esophagitis: Secondary | ICD-10-CM | POA: Diagnosis not present

## 2019-06-20 DIAGNOSIS — D509 Iron deficiency anemia, unspecified: Secondary | ICD-10-CM | POA: Diagnosis not present

## 2019-06-20 DIAGNOSIS — E1142 Type 2 diabetes mellitus with diabetic polyneuropathy: Secondary | ICD-10-CM | POA: Diagnosis not present

## 2019-06-20 DIAGNOSIS — Z7984 Long term (current) use of oral hypoglycemic drugs: Secondary | ICD-10-CM | POA: Diagnosis not present

## 2019-06-22 DIAGNOSIS — D509 Iron deficiency anemia, unspecified: Secondary | ICD-10-CM | POA: Diagnosis not present

## 2019-06-22 DIAGNOSIS — Z7984 Long term (current) use of oral hypoglycemic drugs: Secondary | ICD-10-CM | POA: Diagnosis not present

## 2019-06-22 DIAGNOSIS — E1142 Type 2 diabetes mellitus with diabetic polyneuropathy: Secondary | ICD-10-CM | POA: Diagnosis not present

## 2019-06-22 DIAGNOSIS — I10 Essential (primary) hypertension: Secondary | ICD-10-CM | POA: Diagnosis not present

## 2019-06-22 DIAGNOSIS — M4800 Spinal stenosis, site unspecified: Secondary | ICD-10-CM | POA: Diagnosis not present

## 2019-06-22 DIAGNOSIS — Z9181 History of falling: Secondary | ICD-10-CM | POA: Diagnosis not present

## 2019-06-22 DIAGNOSIS — G35 Multiple sclerosis: Secondary | ICD-10-CM | POA: Diagnosis not present

## 2019-06-22 DIAGNOSIS — K219 Gastro-esophageal reflux disease without esophagitis: Secondary | ICD-10-CM | POA: Diagnosis not present

## 2019-06-22 DIAGNOSIS — N3281 Overactive bladder: Secondary | ICD-10-CM | POA: Diagnosis not present

## 2019-06-27 DIAGNOSIS — K219 Gastro-esophageal reflux disease without esophagitis: Secondary | ICD-10-CM | POA: Diagnosis not present

## 2019-06-27 DIAGNOSIS — E1142 Type 2 diabetes mellitus with diabetic polyneuropathy: Secondary | ICD-10-CM | POA: Diagnosis not present

## 2019-06-27 DIAGNOSIS — G35 Multiple sclerosis: Secondary | ICD-10-CM | POA: Diagnosis not present

## 2019-06-27 DIAGNOSIS — N3281 Overactive bladder: Secondary | ICD-10-CM | POA: Diagnosis not present

## 2019-06-27 DIAGNOSIS — D509 Iron deficiency anemia, unspecified: Secondary | ICD-10-CM | POA: Diagnosis not present

## 2019-06-27 DIAGNOSIS — Z7984 Long term (current) use of oral hypoglycemic drugs: Secondary | ICD-10-CM | POA: Diagnosis not present

## 2019-06-27 DIAGNOSIS — Z9181 History of falling: Secondary | ICD-10-CM | POA: Diagnosis not present

## 2019-06-27 DIAGNOSIS — I10 Essential (primary) hypertension: Secondary | ICD-10-CM | POA: Diagnosis not present

## 2019-06-27 DIAGNOSIS — M4800 Spinal stenosis, site unspecified: Secondary | ICD-10-CM | POA: Diagnosis not present

## 2019-06-29 DIAGNOSIS — I10 Essential (primary) hypertension: Secondary | ICD-10-CM | POA: Diagnosis not present

## 2019-06-29 DIAGNOSIS — Z9181 History of falling: Secondary | ICD-10-CM | POA: Diagnosis not present

## 2019-06-29 DIAGNOSIS — Z7984 Long term (current) use of oral hypoglycemic drugs: Secondary | ICD-10-CM | POA: Diagnosis not present

## 2019-06-29 DIAGNOSIS — G35 Multiple sclerosis: Secondary | ICD-10-CM | POA: Diagnosis not present

## 2019-06-29 DIAGNOSIS — N3281 Overactive bladder: Secondary | ICD-10-CM | POA: Diagnosis not present

## 2019-06-29 DIAGNOSIS — M4800 Spinal stenosis, site unspecified: Secondary | ICD-10-CM | POA: Diagnosis not present

## 2019-06-29 DIAGNOSIS — K219 Gastro-esophageal reflux disease without esophagitis: Secondary | ICD-10-CM | POA: Diagnosis not present

## 2019-06-29 DIAGNOSIS — D509 Iron deficiency anemia, unspecified: Secondary | ICD-10-CM | POA: Diagnosis not present

## 2019-06-29 DIAGNOSIS — E1142 Type 2 diabetes mellitus with diabetic polyneuropathy: Secondary | ICD-10-CM | POA: Diagnosis not present

## 2019-07-04 DIAGNOSIS — E1142 Type 2 diabetes mellitus with diabetic polyneuropathy: Secondary | ICD-10-CM | POA: Diagnosis not present

## 2019-07-04 DIAGNOSIS — Z7984 Long term (current) use of oral hypoglycemic drugs: Secondary | ICD-10-CM | POA: Diagnosis not present

## 2019-07-04 DIAGNOSIS — G35 Multiple sclerosis: Secondary | ICD-10-CM | POA: Diagnosis not present

## 2019-07-04 DIAGNOSIS — I10 Essential (primary) hypertension: Secondary | ICD-10-CM | POA: Diagnosis not present

## 2019-07-04 DIAGNOSIS — D509 Iron deficiency anemia, unspecified: Secondary | ICD-10-CM | POA: Diagnosis not present

## 2019-07-04 DIAGNOSIS — M4800 Spinal stenosis, site unspecified: Secondary | ICD-10-CM | POA: Diagnosis not present

## 2019-07-04 DIAGNOSIS — Z9181 History of falling: Secondary | ICD-10-CM | POA: Diagnosis not present

## 2019-07-04 DIAGNOSIS — N3281 Overactive bladder: Secondary | ICD-10-CM | POA: Diagnosis not present

## 2019-07-04 DIAGNOSIS — K219 Gastro-esophageal reflux disease without esophagitis: Secondary | ICD-10-CM | POA: Diagnosis not present

## 2019-07-10 DIAGNOSIS — K219 Gastro-esophageal reflux disease without esophagitis: Secondary | ICD-10-CM | POA: Diagnosis not present

## 2019-07-10 DIAGNOSIS — M4800 Spinal stenosis, site unspecified: Secondary | ICD-10-CM | POA: Diagnosis not present

## 2019-07-10 DIAGNOSIS — D509 Iron deficiency anemia, unspecified: Secondary | ICD-10-CM | POA: Diagnosis not present

## 2019-07-10 DIAGNOSIS — I10 Essential (primary) hypertension: Secondary | ICD-10-CM | POA: Diagnosis not present

## 2019-07-10 DIAGNOSIS — N3281 Overactive bladder: Secondary | ICD-10-CM | POA: Diagnosis not present

## 2019-07-10 DIAGNOSIS — G35 Multiple sclerosis: Secondary | ICD-10-CM | POA: Diagnosis not present

## 2019-07-10 DIAGNOSIS — Z7984 Long term (current) use of oral hypoglycemic drugs: Secondary | ICD-10-CM | POA: Diagnosis not present

## 2019-07-10 DIAGNOSIS — Z9181 History of falling: Secondary | ICD-10-CM | POA: Diagnosis not present

## 2019-07-10 DIAGNOSIS — E1142 Type 2 diabetes mellitus with diabetic polyneuropathy: Secondary | ICD-10-CM | POA: Diagnosis not present

## 2019-07-11 DIAGNOSIS — E1142 Type 2 diabetes mellitus with diabetic polyneuropathy: Secondary | ICD-10-CM | POA: Diagnosis not present

## 2019-07-11 DIAGNOSIS — Z9181 History of falling: Secondary | ICD-10-CM | POA: Diagnosis not present

## 2019-07-11 DIAGNOSIS — D509 Iron deficiency anemia, unspecified: Secondary | ICD-10-CM | POA: Diagnosis not present

## 2019-07-11 DIAGNOSIS — G35 Multiple sclerosis: Secondary | ICD-10-CM | POA: Diagnosis not present

## 2019-07-11 DIAGNOSIS — Z7984 Long term (current) use of oral hypoglycemic drugs: Secondary | ICD-10-CM | POA: Diagnosis not present

## 2019-07-11 DIAGNOSIS — N3281 Overactive bladder: Secondary | ICD-10-CM | POA: Diagnosis not present

## 2019-07-11 DIAGNOSIS — I10 Essential (primary) hypertension: Secondary | ICD-10-CM | POA: Diagnosis not present

## 2019-07-11 DIAGNOSIS — K219 Gastro-esophageal reflux disease without esophagitis: Secondary | ICD-10-CM | POA: Diagnosis not present

## 2019-07-11 DIAGNOSIS — M4800 Spinal stenosis, site unspecified: Secondary | ICD-10-CM | POA: Diagnosis not present

## 2019-07-21 DIAGNOSIS — Z9181 History of falling: Secondary | ICD-10-CM | POA: Diagnosis not present

## 2019-07-21 DIAGNOSIS — N3281 Overactive bladder: Secondary | ICD-10-CM | POA: Diagnosis not present

## 2019-07-21 DIAGNOSIS — D509 Iron deficiency anemia, unspecified: Secondary | ICD-10-CM | POA: Diagnosis not present

## 2019-07-21 DIAGNOSIS — G35 Multiple sclerosis: Secondary | ICD-10-CM | POA: Diagnosis not present

## 2019-07-21 DIAGNOSIS — E1142 Type 2 diabetes mellitus with diabetic polyneuropathy: Secondary | ICD-10-CM | POA: Diagnosis not present

## 2019-07-21 DIAGNOSIS — I10 Essential (primary) hypertension: Secondary | ICD-10-CM | POA: Diagnosis not present

## 2019-07-21 DIAGNOSIS — K219 Gastro-esophageal reflux disease without esophagitis: Secondary | ICD-10-CM | POA: Diagnosis not present

## 2019-07-21 DIAGNOSIS — Z7984 Long term (current) use of oral hypoglycemic drugs: Secondary | ICD-10-CM | POA: Diagnosis not present

## 2019-07-21 DIAGNOSIS — M4800 Spinal stenosis, site unspecified: Secondary | ICD-10-CM | POA: Diagnosis not present

## 2019-07-25 DIAGNOSIS — E1142 Type 2 diabetes mellitus with diabetic polyneuropathy: Secondary | ICD-10-CM | POA: Diagnosis not present

## 2019-07-25 DIAGNOSIS — Z7984 Long term (current) use of oral hypoglycemic drugs: Secondary | ICD-10-CM | POA: Diagnosis not present

## 2019-07-25 DIAGNOSIS — Z9181 History of falling: Secondary | ICD-10-CM | POA: Diagnosis not present

## 2019-07-25 DIAGNOSIS — D509 Iron deficiency anemia, unspecified: Secondary | ICD-10-CM | POA: Diagnosis not present

## 2019-07-25 DIAGNOSIS — M4800 Spinal stenosis, site unspecified: Secondary | ICD-10-CM | POA: Diagnosis not present

## 2019-07-25 DIAGNOSIS — K219 Gastro-esophageal reflux disease without esophagitis: Secondary | ICD-10-CM | POA: Diagnosis not present

## 2019-07-25 DIAGNOSIS — I10 Essential (primary) hypertension: Secondary | ICD-10-CM | POA: Diagnosis not present

## 2019-07-25 DIAGNOSIS — G35 Multiple sclerosis: Secondary | ICD-10-CM | POA: Diagnosis not present

## 2019-07-25 DIAGNOSIS — N3281 Overactive bladder: Secondary | ICD-10-CM | POA: Diagnosis not present

## 2019-08-04 DIAGNOSIS — M4800 Spinal stenosis, site unspecified: Secondary | ICD-10-CM | POA: Diagnosis not present

## 2019-08-04 DIAGNOSIS — K219 Gastro-esophageal reflux disease without esophagitis: Secondary | ICD-10-CM | POA: Diagnosis not present

## 2019-08-04 DIAGNOSIS — Z7984 Long term (current) use of oral hypoglycemic drugs: Secondary | ICD-10-CM | POA: Diagnosis not present

## 2019-08-04 DIAGNOSIS — E1142 Type 2 diabetes mellitus with diabetic polyneuropathy: Secondary | ICD-10-CM | POA: Diagnosis not present

## 2019-08-04 DIAGNOSIS — N3281 Overactive bladder: Secondary | ICD-10-CM | POA: Diagnosis not present

## 2019-08-04 DIAGNOSIS — G35 Multiple sclerosis: Secondary | ICD-10-CM | POA: Diagnosis not present

## 2019-08-04 DIAGNOSIS — Z9181 History of falling: Secondary | ICD-10-CM | POA: Diagnosis not present

## 2019-08-04 DIAGNOSIS — D509 Iron deficiency anemia, unspecified: Secondary | ICD-10-CM | POA: Diagnosis not present

## 2019-08-04 DIAGNOSIS — I10 Essential (primary) hypertension: Secondary | ICD-10-CM | POA: Diagnosis not present

## 2019-08-06 ENCOUNTER — Other Ambulatory Visit: Payer: Self-pay | Admitting: Nurse Practitioner

## 2019-08-06 DIAGNOSIS — E1151 Type 2 diabetes mellitus with diabetic peripheral angiopathy without gangrene: Secondary | ICD-10-CM

## 2019-08-07 NOTE — Telephone Encounter (Signed)
Do you want to see him F2F or virtual? Please advise last ov was 03/2019.

## 2019-09-01 ENCOUNTER — Telehealth: Payer: Self-pay | Admitting: Nurse Practitioner

## 2019-09-01 NOTE — Progress Notes (Signed)
  Chronic Care Management   Note  09/01/2019 Name: Larry [name] MRN: [id number] DOB: 1951/01/16  Larry [name] is a 69 y.o. year old male who is a primary care patient of Nche, Charlene Brooke, NP. I reached out to Julieanne Manson by phone today in response to a referral sent by Mr. Randeep Berghuis's PCP, Nche, Charlene Brooke, NP.   Larry [name] was given information about Chronic Care Management services today including:  1. CCM service includes personalized support from designated clinical staff supervised by his physician, including individualized plan of care and coordination with other care providers 2. 24/7 contact phone numbers for assistance for urgent and routine care needs. 3. Service will only be billed when office clinical staff spend 20 minutes or more in a month to coordinate care. 4. Only one practitioner may furnish and bill the service in a calendar month. 5. The patient may stop CCM services at any time (effective at the end of the month) by phone call to the office staff.   Patient agreed to services and verbal consent obtained.   Follow up plan:   Earney [place] [facility] Scheduler

## 2019-09-04 ENCOUNTER — Other Ambulatory Visit: Payer: Self-pay

## 2019-09-04 ENCOUNTER — Telehealth: Payer: Self-pay | Admitting: Nurse Practitioner

## 2019-09-04 DIAGNOSIS — I152 Hypertension secondary to endocrine disorders: Secondary | ICD-10-CM

## 2019-09-04 DIAGNOSIS — K219 Gastro-esophageal reflux disease without esophagitis: Secondary | ICD-10-CM

## 2019-09-04 DIAGNOSIS — G35 Multiple sclerosis: Secondary | ICD-10-CM

## 2019-09-04 DIAGNOSIS — E1142 Type 2 diabetes mellitus with diabetic polyneuropathy: Secondary | ICD-10-CM

## 2019-09-04 NOTE — Telephone Encounter (Signed)
I think he should first call that office and inquire is the letter was received, and if not we can fax it again. We will need the need and name of person we need to fax letter to.

## 2019-09-04 NOTE — Progress Notes (Signed)
Created referral for CCM/thx dmf

## 2019-09-04 NOTE — Telephone Encounter (Signed)
Pt called and said that Larry [name] had written a letter to the post master about moving his mail box closer because of his condition and they havent moved it and he wanted to know if she can write another letter and send it. Please advise. If there are any questions please call [phone]

## 2019-09-05 ENCOUNTER — Encounter: Payer: Self-pay | Admitting: Nurse Practitioner

## 2019-09-05 NOTE — Telephone Encounter (Signed)
Pt stated he took the letter to the office but they want the letter to more specific by adding that the patient live alone and he wants mailbox at his door to help decrease risk of fall.   [name] please advise, ok to add these to the letter you wrote back in 06/2019?

## 2019-09-05 NOTE — Telephone Encounter (Signed)
Left detail message inform the pt.

## 2019-09-05 NOTE — Telephone Encounter (Signed)
Ok to add to letter and reprint

## 2019-09-05 NOTE — Chronic Care Management (AMB) (Signed)
Chronic Care Management Pharmacy  Name: Larry [name]  MRN: [id number] DOB: [birth year]  Chief Complaint/ HPI  Larry [name],  69 y.o. , male presents for their Initial CCM visit with the clinical pharmacist via telephone.  PCP : Flossie Buffy, NP  Their chronic conditions include: hypertension, type 2 diabetes, peripheral neuropathy, overactive bladder  Office Visits: 03/24/19: Patient presented to [facility] for follow-up. Patient with worsening gait abnormality and worsening LE edema. Metformin decreased to 850 mg BID, pioglitazone decreased to 15 mg daily   Consult Visits:  05/09/19: Patient referred to Dr. Marcial Pacas (Neuro) for gait abnormality. profound bilateral leg weakness, dependent on wheelchair. MRI revealed moderate-severe spinal stenosis. Patient referred to Dr. Ellene Route for surgical decompression.   Medications: Outpatient Encounter Medications as of 09/06/2019  Medication Sig  . ACCU-CHEK AVIVA PLUS test strip TEST ONCE DAILY (MORNING  FASTING)  . Accu-Chek FastClix Lancets MISC   . aspirin 81 MG tablet Take 81 mg by mouth daily.  . baclofen (LIORESAL) 20 MG tablet TAKE 1 TABLET BY MOUTH 3  TIMES DAILY  . Blood Glucose Calibration (ACCU-CHEK AVIVA) SOLN   . cloNIDine (CATAPRES) 0.1 MG tablet Take 1 tablet (0.1 mg total) by mouth 2 (two) times daily.  . Cyanocobalamin (VITAMIN B-12 PO) Take 1 tablet by mouth 2 (two) times daily. Patient is unsure of dose  . ferrous sulfate 325 (65 FE) MG tablet Take 1 tablet (325 mg total) by mouth daily with breakfast.  . finasteride (PROSCAR) 5 MG tablet Take 1 tablet (5 mg total) by mouth daily.  . furosemide (LASIX) 20 MG tablet Take 1 tablet (20 mg total) by mouth daily.  . Incontinence Supply Disposable (DEPEND ADJUSTABLE UNDERWEAR LG) MISC Use as needed.  . Lancets Misc. (ACCU-CHEK FASTCLIX LANCET) KIT 1 Units by Does not apply route daily before breakfast.  . meloxicam (MOBIC) 15 MG tablet TAKE 1 TABLET BY MOUTH   DAILY AS NEEDED FOR PAIN  . metFORMIN (GLUCOPHAGE) 850 MG tablet Take 1 tablet (850 mg total) by mouth 2 (two) times daily with a meal. Need office visit for additional refills  . Multiple Vitamin (MULTIVITAMIN) tablet Take 1 tablet by mouth daily.  . Olmesartan-amLODIPine-HCTZ 40-10-12.5 MG TABS TAKE 1 TABLET BY MOUTH  DAILY  . omeprazole (PRILOSEC) 20 MG capsule Take 1 capsule (20 mg total) by mouth daily.  Marland Kitchen oxybutynin (DITROPAN-XL) 5 MG 24 hr tablet Take 1 tablet (5 mg total) by mouth at bedtime.  . pioglitazone (ACTOS) 15 MG tablet Take 1 tablet (15 mg total) by mouth daily. Need office visit for additional refills  . potassium chloride SA (KLOR-CON) 20 MEQ tablet Take 1 tablet (20 mEq total) by mouth daily.  . pregabalin (LYRICA) 75 MG capsule Take 1 capsule (75 mg total) by mouth 2 (two) times daily.  . traZODone (DESYREL) 50 MG tablet TAKE 1 TABLET BY MOUTH AT  BEDTIME   No facility-administered encounter medications on file as of 09/06/2019.     Current Diagnosis/Assessment:  Goals Addressed   None     Diabetes   Recent Relevant Labs: Lab Results  Component Value Date/Time   HGBA1C 5.9 03/24/2019 03:55 PM   HGBA1C 6.1 06/10/2018 11:22 AM   MICROALBUR 4.7 (H) 07/27/2016 12:57 PM     Checking BG: Twice Weekly  Recent FBG Readings: 103, 98, 112, 115. Highest in past month 120 Recent pre-meal BG readings: n/a Recent 2hr PP BG readings:  n/a Recent HS BG readings: n/a  Patient has  failed these meds in past: n/a  Patient is currently controlled on the following medications:   Metformin 850 mg twice daily   Pioglitazone 15 mg daily  Goal A1c <7% Last diabetic Foot exam:  Lab Results  Component Value Date/Time   HMDIABEYEEXA No Retinopathy 04/15/2018 12:00 AM    Last diabetic Eye exam: No results found for: HMDIABFOOTEX   We discussed: diet and exercise extensively. Patient using up current supply of metformin 1000 mg tablets and taking one daily. He states he still  has ~2 months of 1000 mg tablets left. Patient watches the amount of starchy foods he eats, but does drink soda. He reports he drinks ~1/2 regular coke bottle daily. Denies any current peripheral edema, but has had instances of worsening edema in the past.  Plan  Recommend discontinuing pioglitazone and increasing metformin back to 1000 mg BID. Given patient's excellent control and history of doing well with metformin 1000 mg BID, this change would reduce medication complexity and minimize risk of weight gain/peripheral edema with pioglitazone.   Recommend A1c   Hypertension   Office blood pressures are  BP Readings from Last 3 Encounters:  05/09/19 129/71  03/24/19 132/68  12/12/18 (!) 129/57   CMP Latest Ref Rng & Units 03/24/2019 06/10/2018 02/09/2018  Glucose 70 - 99 mg/dL 104(H) 113(H) 115(H)  BUN 6 - 23 mg/dL 21 28(H) 26(H)  Creatinine 0.40 - 1.50 mg/dL 0.90 1.11 1.03  Sodium 135 - 145 mEq/L 140 142 140  Potassium 3.5 - 5.1 mEq/L 3.7 4.3 3.6  Chloride 96 - 112 mEq/L 102 104 102  CO2 19 - 32 mEq/L '30 29 28  ' Calcium 8.4 - 10.5 mg/dL 9.6 10.1 10.1  Total Protein 6.0 - 8.3 g/dL 6.4 - -  Total Bilirubin 0.2 - 1.2 mg/dL 0.4 - -  Alkaline Phos 39 - 117 U/L 66 - -  AST 0 - 37 U/L 21 - -  ALT 0 - 53 U/L 10 - -   Patient has failed these meds in the past: n/a Patient is currently controlled on the following medications:   Olmesartan-Amlodipine-HCTZ 40-10-12.5 mg daily   Clonidine 0.1 mg BID   Patient checks BP at home infrequently   Patient home BP readings are ranging: 130/80s   We discussed diet and exercise extensively No feet swelling. Does not drink enough water. Drinks one large cup of coffee every few days.  Plan  Continue current medications  Increase BP monitoring to once weekly    Hyperlipidemia   Lipid Panel     Component Value Date/Time   CHOL 169 06/10/2018 1122   TRIG 81.0 06/10/2018 1122   HDL 51.90 06/10/2018 1122   CHOLHDL 3 06/10/2018 1122   VLDL  16.2 06/10/2018 1122   LDLCALC 101 (H) 06/10/2018 1122     The ASCVD Risk score (Goff DC Jr., et al., 2013) failed to calculate for the following reasons:   Unable to determine if patient is Non-Hispanic African American   Patient has failed these meds in past: n/a Patient is currently uncontrolled on the following medications:   Aspirin 81 mg daily   We discussed: Patient would likely benefit from statin therapy due to history of diabetes. Will defer until a later visit. Denies unusual bruising/bleeding with aspirin.  Plan  Continue current medications  Recommend fasting lipid panel.   Pain/ Peripheral Neuralgia   Patient has failed these meds in past: n/a Patient is currently managed on the following medications:   Baclofen 20 mg  TID   Meloxicam 15 mg daily PRN (taking daily)   Lyrica 75 mg BID  We discussed: Patient taking meloxicam daily at bedtime. Reinforced only taking meloxicam as needed for pain due to risk of adverse events with chronic NSAID use (GI bleed, AKI).   Plan  Continue current medications   Overactive bladder   Patient has failed these meds in past: n/a Patient is currently controlled on the following medications:   Finasteride 5 mg daily    Oxybutynin XL 5 mg daily  We discussed: patient tolerating medications.   Plan  Continue current medications  GERD   Patient has failed these meds in past: n/a Patient is currently controlled on the following medications:   Omeprazole 20 mg daily   We discussed: Patient has not had GERD symptoms in a long time and asked if omeprazole was still necessary to take. We discussed the risks associated with prolonged PPI use.   Plan  Recommend stopping omeprazole and GERD symptoms have been well controlled over the past few months.    Misc/OTC   Multivitamin daily  Potassium chloride 20 mEq daily  Trazodone 50 mg QHS   Plan  Continue current medications  Vaccines   Reviewed and discussed patient's  vaccination history.    Immunization History  Administered Date(s) Administered  . Fluad Quad(high Dose 65+) 03/24/2019  . Influenza Split 02/05/2012  . Influenza, High Dose Seasonal PF 03/05/2014, 05/17/2015, 02/15/2017, 06/10/2018  . Influenza,inj,Quad PF,6+ Mos 05/09/2013  . PPD Test 01/05/2016  . Pneumococcal Conjugate-13 03/05/2014  . Pneumococcal Polysaccharide-23 05/17/2015   Medication Management   Pt uses [facility] for all medications. He does not use a pillbox for his medications.   Follow up: 3 months   Doristine Section [phone]

## 2019-09-06 ENCOUNTER — Ambulatory Visit: Payer: Medicare Other

## 2019-09-06 ENCOUNTER — Telehealth: Payer: Self-pay | Admitting: Nurse Practitioner

## 2019-09-06 DIAGNOSIS — E1142 Type 2 diabetes mellitus with diabetic polyneuropathy: Secondary | ICD-10-CM

## 2019-09-06 DIAGNOSIS — I152 Hypertension secondary to endocrine disorders: Secondary | ICD-10-CM

## 2019-09-06 NOTE — Telephone Encounter (Signed)
Ok to discontinue omeprazole and actos. Maintain metformin dose at 850mg  BID Will make adjustment to metformin after review of lab results.  Pete please Schedule F2F appt with me for DM, and HTN management.  Thank you

## 2019-09-06 NOTE — Patient Instructions (Addendum)
Visit Information It was speaking with you today. Please let me know if you have any questions about our visit.  Goals Addressed            This Visit's Progress   . Chronic Care Management       CARE PLAN ENTRY  Current Barriers:  . Chronic Disease Management support, education, and care coordination needs related to hypertension, type 2 diabetes, peripheral neuropathy, overactive bladder  Clinical Goal(s): Over the next 90 days, patient will:  . Work with the care management team to address educational, disease management, and care coordination needs  . Begin or continue self health monitoring activities as directed today  . Call provider office for new or worsened signs and symptoms  . Call care management team with questions or concerns . Maintain A1c less than 7% . Maintain blood pressure less than 140/90 . Achieve LDL (bad cholesterol) less than 70   Interventions:  . Evaluation of current treatment plans and patient's adherence to plan as established by provider . Assessed patient understanding of disease states . Assessed patient's education and care coordination needs . Provided disease specific education to patient  . Stop taking omeprazole. If reflux symptoms return you may resume taking it.  . Stop taking pioglitazone. Continue Metformin  Telephone follow up appointment with pharmacy team member scheduled for: 12/06/19 at 8:30 AM        [name] was given information about Chronic Care Management services today including:  1. CCM service includes personalized support from designated clinical staff supervised by his physician, including individualized plan of care and coordination with other care providers 2. 24/7 contact phone numbers for assistance for urgent and routine care needs. 3. Standard insurance, coinsurance, copays and deductibles apply for chronic care management only during months in which we provide at least 20 minutes of these services. Most  insurances cover these services at 100%, however patients may be responsible for any copay, coinsurance and/or deductible if applicable. This service may help you avoid the need for more expensive face-to-face services. 4. Only one practitioner may furnish and bill the service in a calendar month. 5. The patient may stop CCM services at any time (effective at the end of the month) by phone call to the office staff.  Patient agreed to services and verbal consent obtained.   The patient verbalized understanding of instructions provided today and agreed to receive a mailed copy of patient instruction and/or educational materials. Telephone follow up appointment with pharmacy team member scheduled for: 12/06/19  Doristine Section [phone]  Carbohydrate Counting for Diabetes Mellitus, Adult  Carbohydrate counting is a method of keeping track of how many carbohydrates you eat. Eating carbohydrates naturally increases the amount of sugar (glucose) in the blood. Counting how many carbohydrates you eat helps keep your blood glucose within normal limits, which helps you manage your diabetes (diabetes mellitus). It is important to know how many carbohydrates you can safely have in each meal. This is different for every person. A diet and nutrition specialist (registered dietitian) can help you make a meal plan and calculate how many carbohydrates you should have at each meal and snack. Carbohydrates are found in the following foods:  Grains, such as breads and cereals.  Dried beans and soy products.  Starchy vegetables, such as potatoes, peas, and corn.  Fruit and fruit juices.  Milk and yogurt.  Sweets and snack foods, such as cake, cookies, candy, chips, and soft drinks. How do I count carbohydrates? There  are two ways to count carbohydrates in food. You can use either of the methods or a combination of both. Reading "Nutrition Facts" on packaged food The "Nutrition Facts" list is included  on the labels of almost all packaged foods and beverages in the [place] It includes:  The serving size.  Information about nutrients in each serving, including the grams (g) of carbohydrate per serving. To use the "Nutrition Facts":  Decide how many servings you will have.  Multiply the number of servings by the number of carbohydrates per serving.  The resulting number is the total amount of carbohydrates that you will be having. Learning standard serving sizes of other foods When you eat carbohydrate foods that are not packaged or do not include "Nutrition Facts" on the label, you need to measure the servings in order to count the amount of carbohydrates:  Measure the foods that you will eat with a food scale or measuring cup, if needed.  Decide how many standard-size servings you will eat.  Multiply the number of servings by 15. Most carbohydrate-rich foods have about 15 g of carbohydrates per serving. ? For example, if you eat 8 oz (170 g) of strawberries, you will have eaten 2 servings and 30 g of carbohydrates (2 servings x 15 g = 30 g).  For foods that have more than one food mixed, such as soups and casseroles, you must count the carbohydrates in each food that is included. The following list contains standard serving sizes of common carbohydrate-rich foods. Each of these servings has about 15 g of carbohydrates:   hamburger bun or  English muffin.   oz (15 mL) syrup.   oz (14 g) jelly.  1 slice of bread.  1 six-inch tortilla.  3 oz (85 g) cooked rice or pasta.  4 oz (113 g) cooked dried beans.  4 oz (113 g) starchy vegetable, such as peas, corn, or potatoes.  4 oz (113 g) hot cereal.  4 oz (113 g) mashed potatoes or  of a large baked potato.  4 oz (113 g) canned or frozen fruit.  4 oz (120 mL) fruit juice.  4-6 crackers.  6 chicken nuggets.  6 oz (170 g) unsweetened dry cereal.  6 oz (170 g) plain fat-free yogurt or yogurt sweetened with artificial  sweeteners.  8 oz (240 mL) milk.  8 oz (170 g) fresh fruit or one small piece of fruit.  24 oz (680 g) popped popcorn. Example of carbohydrate counting Sample meal  3 oz (85 g) chicken breast.  6 oz (170 g) brown rice.  4 oz (113 g) corn.  8 oz (240 mL) milk.  8 oz (170 g) strawberries with sugar-free whipped topping. Carbohydrate calculation 1. Identify the foods that contain carbohydrates: ? Rice. ? Corn. ? Milk. ? Strawberries. 2. Calculate how many servings you have of each food: ? 2 servings rice. ? 1 serving corn. ? 1 serving milk. ? 1 serving strawberries. 3. Multiply each number of servings by 15 g: ? 2 servings rice x 15 g = 30 g. ? 1 serving corn x 15 g = 15 g. ? 1 serving milk x 15 g = 15 g. ? 1 serving strawberries x 15 g = 15 g. 4. Add together all of the amounts to find the total grams of carbohydrates eaten: ? 30 g + 15 g + 15 g + 15 g = 75 g of carbohydrates total. Summary  Carbohydrate counting is a method of keeping track of  how many carbohydrates you eat.  Eating carbohydrates naturally increases the amount of sugar (glucose) in the blood.  Counting how many carbohydrates you eat helps keep your blood glucose within normal limits, which helps you manage your diabetes.  A diet and nutrition specialist (registered dietitian) can help you make a meal plan and calculate how many carbohydrates you should have at each meal and snack. This information is not intended to replace advice given to you by your health care provider. Make sure you discuss any questions you have with your health care provider. Document Revised: 12/10/2016 Document Reviewed: 10/30/2015 Elsevier Patient Education  [place].

## 2019-09-06 NOTE — Telephone Encounter (Signed)
-----   Message from Germaine Pomfret, [facility] sent at 09/06/2019  9:56 AM EDT ----- Larry [name], Patient has excellent control over his diabetes. What would you think about taking him off pioglitazone completely? I would suggest stopping pioglitazone and having him increase his metformin back from 1000 mg daily to 1000 mg twice daily. This would help lower his risk of weight gain/edema associated with pioglitazone. His GERD symptoms have completely resolved as well. What would you think about having the patient stop omeprazole and seeing how he does?It does look like he is due for a repeat A1c and fasting lipid panel as well.Thanks, Cristie Hem

## 2019-09-07 NOTE — Telephone Encounter (Signed)
LVM for the pt to call back.

## 2019-09-08 NOTE — Telephone Encounter (Signed)
Pt is aware of message below.   appt made with Nche on 09/14/2019.

## 2019-09-14 ENCOUNTER — Telehealth (INDEPENDENT_AMBULATORY_CARE_PROVIDER_SITE_OTHER): Payer: Medicare Other | Admitting: Nurse Practitioner

## 2019-09-14 ENCOUNTER — Other Ambulatory Visit: Payer: Self-pay

## 2019-09-14 ENCOUNTER — Encounter: Payer: Self-pay | Admitting: Nurse Practitioner

## 2019-09-14 VITALS — BP 118/69 | Ht 67.0 in | Wt 165.0 lb

## 2019-09-14 DIAGNOSIS — N138 Other obstructive and reflux uropathy: Secondary | ICD-10-CM | POA: Insufficient documentation

## 2019-09-14 DIAGNOSIS — N3281 Overactive bladder: Secondary | ICD-10-CM

## 2019-09-14 DIAGNOSIS — G4700 Insomnia, unspecified: Secondary | ICD-10-CM

## 2019-09-14 DIAGNOSIS — G629 Polyneuropathy, unspecified: Secondary | ICD-10-CM | POA: Diagnosis not present

## 2019-09-14 DIAGNOSIS — N401 Enlarged prostate with lower urinary tract symptoms: Secondary | ICD-10-CM

## 2019-09-14 DIAGNOSIS — E1142 Type 2 diabetes mellitus with diabetic polyneuropathy: Secondary | ICD-10-CM | POA: Diagnosis not present

## 2019-09-14 DIAGNOSIS — I152 Hypertension secondary to endocrine disorders: Secondary | ICD-10-CM

## 2019-09-14 DIAGNOSIS — Z13220 Encounter for screening for lipoid disorders: Secondary | ICD-10-CM

## 2019-09-14 DIAGNOSIS — Z136 Encounter for screening for cardiovascular disorders: Secondary | ICD-10-CM

## 2019-09-14 MED ORDER — TRAZODONE HCL 50 MG PO TABS: 50.0000 mg | ORAL_TABLET | Freq: Every day | ORAL | 3 refills | Status: DC

## 2019-09-14 MED ORDER — OXYBUTYNIN CHLORIDE ER 5 MG PO TB24: 5.0000 mg | ORAL_TABLET | Freq: Every day | ORAL | 5 refills | Status: DC

## 2019-09-14 MED ORDER — PREGABALIN 75 MG PO CAPS: 75.0000 mg | ORAL_CAPSULE | Freq: Two times a day (BID) | ORAL | 3 refills | Status: DC

## 2019-09-14 NOTE — Assessment & Plan Note (Signed)
controlled with HgbA1c at 5.9 actos discontinued due to risk of hypoglycemia and cause of LE edema. Checks glucose once a week: 120s No hypoglycemic symptoms, no nausea or diarrhea. Stable neuropathy with lyrica.  Repeat urine microalbumin and hgbA1c.

## 2019-09-14 NOTE — Assessment & Plan Note (Signed)
Stable with use of proscar, and oxybutynin Needs repeat PSA

## 2019-09-14 NOTE — Patient Instructions (Signed)
Go to lab for blood draw, if URI symptoms resolve. Maintain current medications

## 2019-09-14 NOTE — Progress Notes (Signed)
Virtual Visit via Video Note  I connected with@ on 09/14/19 at 11:00 AM EDT by a video enabled telemedicine application and verified that I am speaking with the correct person using two identifiers.  Location: Patient:Home Provider: Office Participants: patient and provider  I discussed the limitations of evaluation and management by telemedicine and the availability of in person appointments. I also discussed with the patient that there may be a patient responsible charge related to this service. The patient expressed understanding and agreed to proceed.  CC:HTN and DM f/up, also reports sorethroat and post nasal drainage x 2days  History of Present Illness: HTN: BP at goal with clonidine, olmesartan, HCTZ and amlodipine. Denies any hypotension, dizziness or weakness Resolved LE edema Neuropathy due to DM and spinal stenosis, stable with lyrica BP Readings from Last 3 Encounters:  09/14/19 118/69  05/09/19 129/71  03/24/19 132/68   DM:  controlled with HgbA1c at 5.9 Checks glucose once a week: 120s No hypoglycemic symptoms, no nausea or diarrhea. Stable neuropathy.  BPH with urinary frequency and OAB: Stable with use of proscar, and oxybutynin Needs repeat PSA  He is requesting trazodone refill. This was previous prescribed by neurology: Dr. Krista Blue due to insomnia. He denies any daytime somnolence, especially in combination with lyrica.  Sore Throat  This is a new problem. The current episode started in the past 7 days. The problem has been gradually improving. There has been no fever. Associated symptoms include congestion. Pertinent negatives include no coughing, ear discharge, ear pain, headaches, hoarse voice, plugged ear sensation, neck pain, shortness of breath, stridor, swollen glands, trouble swallowing or vomiting. He has had no exposure to strep or mono. He has tried gargles and acetaminophen for the symptoms. The treatment provided significant relief.    Observations/Objective: Physical Exam  Constitutional: He is oriented to person, place, and time.  HENT:  Right Ear: External ear normal.  Left Ear: External ear normal.  Mouth/Throat: Uvula is midline. No trismus in the jaw. Posterior oropharyngeal erythema present. No oropharyngeal exudate or posterior oropharyngeal edema.  Pulmonary/Chest: Effort normal.  Musculoskeletal:     Cervical back: Normal range of motion and neck supple.  Neurological: He is alert and oriented to person, place, and time.  Vitals reviewed.  Assessment and Plan: Larry [name] was seen today for follow-up.  Diagnoses and all orders for this visit:  Type 2 diabetes mellitus with diabetic polyneuropathy, without long-term current use of insulin (HCC) -     Hemoglobin A1c; Future -     Hepatic function panel; Future -     Basic metabolic panel; Future  Peripheral polyneuropathy -     pregabalin (LYRICA) 75 MG capsule; Take 1 capsule (75 mg total) by mouth 2 (two) times daily.  Benign prostatic hyperplasia with lower urinary tract symptoms, symptom details unspecified -     oxybutynin (DITROPAN-XL) 5 MG 24 hr tablet; Take 1 tablet (5 mg total) by mouth at bedtime. -     PSA, Medicare; Future  Overactive bladder -     oxybutynin (DITROPAN-XL) 5 MG 24 hr tablet; Take 1 tablet (5 mg total) by mouth at bedtime.  Insomnia, unspecified type -     traZODone (DESYREL) 50 MG tablet; Take 1 tablet (50 mg total) by mouth at bedtime.  Encounter for lipid screening for cardiovascular disease -     Lipid panel; Future  Hypertension due to endocrine disorder -     Basic metabolic panel; Future   Follow Up Instructions: Go to lab  for blood draw, if URI symptoms resolve. Maintain current medications   I discussed the assessment and treatment plan with the patient. The patient was provided an opportunity to ask questions and all were answered. The patient agreed with the plan and demonstrated an understanding of the  instructions.   The patient was advised to call back or seek an in-person evaluation if the symptoms worsen or if the condition fails to improve as anticipated.  I provided 14 minutes of non-face-to-face time during this encounter.   Wilfred Lacy, NP

## 2019-09-14 NOTE — Assessment & Plan Note (Signed)
Neuropathy due to DM and spinal stenosis, stable with lyrica

## 2019-09-14 NOTE — Assessment & Plan Note (Signed)
BP at goal with clonidine, olmesartan, HCTZ and amlodipine. Denies any hypotension, dizziness or weakness Resolved LE edema BP Readings from Last 3 Encounters:  09/14/19 118/69  05/09/19 129/71  03/24/19 132/68   Repeat BMP Maintain current medications

## 2019-09-20 ENCOUNTER — Other Ambulatory Visit: Payer: Medicare Other

## 2019-09-23 ENCOUNTER — Emergency Department (HOSPITAL_COMMUNITY): Payer: Medicare Other

## 2019-09-23 ENCOUNTER — Inpatient Hospital Stay (HOSPITAL_COMMUNITY)
Admission: EM | Admit: 2019-09-23 | Discharge: 2019-10-04 | DRG: 177 | Disposition: A | Discharge status: Skilled Nursing Facility | Payer: Medicare Other | Attending: Internal Medicine | Admitting: Internal Medicine

## 2019-09-23 ENCOUNTER — Encounter (HOSPITAL_COMMUNITY): Payer: Self-pay | Admitting: Emergency Medicine

## 2019-09-23 DIAGNOSIS — U071 COVID-19: Secondary | ICD-10-CM

## 2019-09-23 DIAGNOSIS — K219 Gastro-esophageal reflux disease without esophagitis: Secondary | ICD-10-CM | POA: Diagnosis not present

## 2019-09-23 DIAGNOSIS — Z7982 Long term (current) use of aspirin: Secondary | ICD-10-CM

## 2019-09-23 DIAGNOSIS — D638 Anemia in other chronic diseases classified elsewhere: Secondary | ICD-10-CM | POA: Diagnosis not present

## 2019-09-23 DIAGNOSIS — E7800 Pure hypercholesterolemia, unspecified: Secondary | ICD-10-CM | POA: Diagnosis not present

## 2019-09-23 DIAGNOSIS — E785 Hyperlipidemia, unspecified: Secondary | ICD-10-CM | POA: Diagnosis present

## 2019-09-23 DIAGNOSIS — Z794 Long term (current) use of insulin: Secondary | ICD-10-CM | POA: Diagnosis not present

## 2019-09-23 DIAGNOSIS — R2689 Other abnormalities of gait and mobility: Secondary | ICD-10-CM | POA: Diagnosis not present

## 2019-09-23 DIAGNOSIS — G35 Multiple sclerosis: Secondary | ICD-10-CM | POA: Diagnosis present

## 2019-09-23 DIAGNOSIS — L8990 Pressure ulcer of unspecified site, unspecified stage: Secondary | ICD-10-CM | POA: Insufficient documentation

## 2019-09-23 DIAGNOSIS — J1282 Pneumonia due to coronavirus disease 2019: Secondary | ICD-10-CM | POA: Diagnosis present

## 2019-09-23 DIAGNOSIS — M6281 Muscle weakness (generalized): Secondary | ICD-10-CM | POA: Diagnosis not present

## 2019-09-23 DIAGNOSIS — N3281 Overactive bladder: Secondary | ICD-10-CM | POA: Diagnosis present

## 2019-09-23 DIAGNOSIS — Z79899 Other long term (current) drug therapy: Secondary | ICD-10-CM | POA: Diagnosis not present

## 2019-09-23 DIAGNOSIS — I2699 Other pulmonary embolism without acute cor pulmonale: Secondary | ICD-10-CM | POA: Diagnosis present

## 2019-09-23 DIAGNOSIS — M2550 Pain in unspecified joint: Secondary | ICD-10-CM | POA: Diagnosis not present

## 2019-09-23 DIAGNOSIS — E1165 Type 2 diabetes mellitus with hyperglycemia: Secondary | ICD-10-CM | POA: Diagnosis not present

## 2019-09-23 DIAGNOSIS — G5600 Carpal tunnel syndrome, unspecified upper limb: Secondary | ICD-10-CM | POA: Diagnosis present

## 2019-09-23 DIAGNOSIS — N401 Enlarged prostate with lower urinary tract symptoms: Secondary | ICD-10-CM | POA: Diagnosis present

## 2019-09-23 DIAGNOSIS — G8220 Paraplegia, unspecified: Secondary | ICD-10-CM | POA: Diagnosis not present

## 2019-09-23 DIAGNOSIS — J9600 Acute respiratory failure, unspecified whether with hypoxia or hypercapnia: Secondary | ICD-10-CM

## 2019-09-23 DIAGNOSIS — Z8249 Family history of ischemic heart disease and other diseases of the circulatory system: Secondary | ICD-10-CM

## 2019-09-23 DIAGNOSIS — E119 Type 2 diabetes mellitus without complications: Secondary | ICD-10-CM

## 2019-09-23 DIAGNOSIS — M4854XA Collapsed vertebra, not elsewhere classified, thoracic region, initial encounter for fracture: Secondary | ICD-10-CM | POA: Diagnosis present

## 2019-09-23 DIAGNOSIS — Z7984 Long term (current) use of oral hypoglycemic drugs: Secondary | ICD-10-CM | POA: Diagnosis not present

## 2019-09-23 DIAGNOSIS — Z743 Need for continuous supervision: Secondary | ICD-10-CM | POA: Diagnosis not present

## 2019-09-23 DIAGNOSIS — Z993 Dependence on wheelchair: Secondary | ICD-10-CM

## 2019-09-23 DIAGNOSIS — N179 Acute kidney failure, unspecified: Secondary | ICD-10-CM | POA: Diagnosis present

## 2019-09-23 DIAGNOSIS — Z7401 Bed confinement status: Secondary | ICD-10-CM | POA: Diagnosis not present

## 2019-09-23 DIAGNOSIS — Z87442 Personal history of urinary calculi: Secondary | ICD-10-CM

## 2019-09-23 DIAGNOSIS — I10 Essential (primary) hypertension: Secondary | ICD-10-CM | POA: Diagnosis present

## 2019-09-23 DIAGNOSIS — N138 Other obstructive and reflux uropathy: Secondary | ICD-10-CM | POA: Diagnosis present

## 2019-09-23 DIAGNOSIS — Z87828 Personal history of other (healed) physical injury and trauma: Secondary | ICD-10-CM

## 2019-09-23 DIAGNOSIS — J9601 Acute respiratory failure with hypoxia: Secondary | ICD-10-CM | POA: Diagnosis not present

## 2019-09-23 DIAGNOSIS — M961 Postlaminectomy syndrome, not elsewhere classified: Secondary | ICD-10-CM | POA: Diagnosis present

## 2019-09-23 DIAGNOSIS — L89152 Pressure ulcer of sacral region, stage 2: Secondary | ICD-10-CM | POA: Diagnosis not present

## 2019-09-23 DIAGNOSIS — I491 Atrial premature depolarization: Secondary | ICD-10-CM | POA: Diagnosis not present

## 2019-09-23 DIAGNOSIS — R0602 Shortness of breath: Secondary | ICD-10-CM | POA: Diagnosis not present

## 2019-09-23 HISTORY — DX: COVID-19: U07.1

## 2019-09-23 HISTORY — DX: Acute respiratory failure, unspecified whether with hypoxia or hypercapnia: J96.00

## 2019-09-23 LAB — COMPREHENSIVE METABOLIC PANEL
ALT: 18 U/L (ref 0–44)
AST: 55 U/L — ABNORMAL HIGH (ref 15–41)
Albumin: 3.3 g/dL — ABNORMAL LOW (ref 3.5–5.0)
Alkaline Phosphatase: 66 U/L (ref 38–126)
Anion gap: 13 (ref 5–15)
BUN: 59 mg/dL — ABNORMAL HIGH (ref 8–23)
CO2: 26 mmol/L (ref 22–32)
Calcium: 8.8 mg/dL — ABNORMAL LOW (ref 8.9–10.3)
Chloride: 102 mmol/L (ref 98–111)
Creatinine, Ser: 2.38 mg/dL — ABNORMAL HIGH (ref 0.61–1.24)
GFR calc Af Amer: 31 mL/min — ABNORMAL LOW (ref >60)
GFR calc non Af Amer: 26 mL/min — ABNORMAL LOW (ref >60)
Glucose, Bld: 233 mg/dL — ABNORMAL HIGH (ref 70–99)
Potassium: 3.9 mmol/L (ref 3.5–5.1)
Sodium: 141 mmol/L (ref 135–145)
Total Bilirubin: 0.8 mg/dL (ref 0.3–1.2)
Total Protein: 6.9 g/dL (ref 6.5–8.1)

## 2019-09-23 LAB — CBC WITH DIFFERENTIAL/PLATELET
Abs Immature Granulocytes: 0.45 10*3/uL — ABNORMAL HIGH (ref 0.00–0.07)
Basophils Absolute: 0 10*3/uL (ref 0.0–0.1)
Basophils Relative: 0 %
Eosinophils Absolute: 0 10*3/uL (ref 0.0–0.5)
Eosinophils Relative: 0 %
HCT: 34.6 % — ABNORMAL LOW (ref 39.0–52.0)
Hemoglobin: 11.6 g/dL — ABNORMAL LOW (ref 13.0–17.0)
Immature Granulocytes: 5 %
Lymphocytes Relative: 3 %
Lymphs Abs: 0.3 10*3/uL — ABNORMAL LOW (ref 0.7–4.0)
MCH: 32 pg (ref 26.0–34.0)
MCHC: 33.5 g/dL (ref 30.0–36.0)
MCV: 95.3 fL (ref 80.0–100.0)
Monocytes Absolute: 0.1 10*3/uL (ref 0.1–1.0)
Monocytes Relative: 1 %
Neutro Abs: 8 10*3/uL — ABNORMAL HIGH (ref 1.7–7.7)
Neutrophils Relative %: 91 %
Platelets: 361 10*3/uL (ref 150–400)
RBC: 3.63 MIL/uL — ABNORMAL LOW (ref 4.22–5.81)
RDW: 14.2 % (ref 11.5–15.5)
WBC: 8.9 10*3/uL (ref 4.0–10.5)
nRBC: 0.7 % — ABNORMAL HIGH (ref 0.0–0.2)

## 2019-09-23 LAB — CBG MONITORING, ED: Glucose-Capillary: 122 mg/dL — ABNORMAL HIGH (ref 70–99)

## 2019-09-23 LAB — POC SARS CORONAVIRUS 2 AG -  ED: SARS Coronavirus 2 Ag: POSITIVE — AB

## 2019-09-23 LAB — BLOOD GAS, ARTERIAL
Acid-Base Excess: 1.2 mmol/L (ref 0.0–2.0)
Bicarbonate: 24.3 mmol/L (ref 20.0–28.0)
Drawn by: 295031
FIO2: 80
O2 Saturation: 87.7 %
Patient temperature: 98.6
pCO2 arterial: 35.3 mmHg (ref 32.0–48.0)
pH, Arterial: 7.453 — ABNORMAL HIGH (ref 7.350–7.450)
pO2, Arterial: 59.3 mmHg — ABNORMAL LOW (ref 83.0–108.0)

## 2019-09-23 LAB — LACTIC ACID, PLASMA: Lactic Acid, Venous: 1.7 mmol/L (ref 0.5–1.9)

## 2019-09-23 MED ORDER — FINASTERIDE 5 MG PO TABS
5.0000 mg | ORAL_TABLET | Freq: Every day | ORAL | Status: DC
  Administered 2019-09-24 – 2019-10-04 (×11): 5 mg via ORAL
  Filled 2019-09-23 (×11): qty 1

## 2019-09-23 MED ORDER — DEXAMETHASONE SODIUM PHOSPHATE 10 MG/ML IJ SOLN
6.0000 mg | INTRAMUSCULAR | Status: DC
  Administered 2019-09-23 – 2019-10-01 (×9): 6 mg via INTRAVENOUS
  Filled 2019-09-23 (×9): qty 1

## 2019-09-23 MED ORDER — INSULIN ASPART 100 UNIT/ML ~~LOC~~ SOLN
0.0000 [IU] | SUBCUTANEOUS | Status: DC
  Administered 2019-09-24 (×4): 1 [IU] via SUBCUTANEOUS
  Administered 2019-09-25 (×2): 2 [IU] via SUBCUTANEOUS
  Administered 2019-09-25 (×2): 1 [IU] via SUBCUTANEOUS
  Administered 2019-09-26: 2 [IU] via SUBCUTANEOUS
  Administered 2019-09-26: 1 [IU] via SUBCUTANEOUS
  Administered 2019-09-26: 2 [IU] via SUBCUTANEOUS
  Administered 2019-09-26: 1 [IU] via SUBCUTANEOUS
  Administered 2019-09-27: 2 [IU] via SUBCUTANEOUS
  Administered 2019-09-27: 1 [IU] via SUBCUTANEOUS
  Administered 2019-09-27 – 2019-09-28 (×2): 2 [IU] via SUBCUTANEOUS
  Administered 2019-09-28 (×2): 1 [IU] via SUBCUTANEOUS
  Administered 2019-09-28 – 2019-09-29 (×3): 2 [IU] via SUBCUTANEOUS
  Administered 2019-09-29 – 2019-09-30 (×4): 1 [IU] via SUBCUTANEOUS
  Administered 2019-09-30 – 2019-10-01 (×2): 2 [IU] via SUBCUTANEOUS
  Administered 2019-10-01 (×3): 1 [IU] via SUBCUTANEOUS
  Administered 2019-10-01: 2 [IU] via SUBCUTANEOUS
  Administered 2019-10-02: 1 [IU] via SUBCUTANEOUS
  Administered 2019-10-02: 2 [IU] via SUBCUTANEOUS
  Administered 2019-10-02: 1 [IU] via SUBCUTANEOUS
  Filled 2019-09-23: qty 0.09

## 2019-09-23 MED ORDER — SODIUM CHLORIDE 0.9 % IV BOLUS
1000.0000 mL | Freq: Once | INTRAVENOUS | Status: AC
  Administered 2019-09-23: 1000 mL via INTRAVENOUS

## 2019-09-23 MED ORDER — AMLODIPINE BESYLATE 10 MG PO TABS
10.0000 mg | ORAL_TABLET | Freq: Every day | ORAL | Status: DC
  Administered 2019-09-24 – 2019-10-04 (×11): 10 mg via ORAL
  Filled 2019-09-23 (×9): qty 1
  Filled 2019-09-23: qty 2
  Filled 2019-09-23: qty 1

## 2019-09-23 MED ORDER — SODIUM CHLORIDE 0.9 % IV SOLN
1.0000 g | INTRAVENOUS | Status: DC
  Administered 2019-09-23 – 2019-09-27 (×5): 1 g via INTRAVENOUS
  Filled 2019-09-23 (×3): qty 10
  Filled 2019-09-23: qty 1
  Filled 2019-09-23: qty 10

## 2019-09-23 MED ORDER — HYDROCOD POLST-CPM POLST ER 10-8 MG/5ML PO SUER
5.0000 mL | Freq: Two times a day (BID) | ORAL | Status: DC | PRN
  Administered 2019-09-24: 5 mL via ORAL
  Filled 2019-09-23: qty 5

## 2019-09-23 MED ORDER — SODIUM CHLORIDE 0.9 % IV SOLN: 200.0000 mg | Freq: Once | INTRAVENOUS | Status: DC

## 2019-09-23 MED ORDER — OLMESARTAN-AMLODIPINE-HCTZ 40-10-12.5 MG PO TABS: 1.0000 | ORAL_TABLET | Freq: Every day | ORAL | Status: DC

## 2019-09-23 MED ORDER — PREGABALIN 75 MG PO CAPS
75.0000 mg | ORAL_CAPSULE | Freq: Two times a day (BID) | ORAL | Status: DC
  Administered 2019-09-23 – 2019-10-04 (×22): 75 mg via ORAL
  Filled 2019-09-23 (×22): qty 1

## 2019-09-23 MED ORDER — ADULT MULTIVITAMIN W/MINERALS CH
1.0000 | ORAL_TABLET | Freq: Every day | ORAL | Status: DC
  Administered 2019-09-24 – 2019-10-04 (×10): 1 via ORAL
  Filled 2019-09-23 (×10): qty 1

## 2019-09-23 MED ORDER — HYDROCHLOROTHIAZIDE 12.5 MG PO CAPS
12.5000 mg | ORAL_CAPSULE | Freq: Every day | ORAL | Status: DC
  Administered 2019-09-24 – 2019-10-04 (×11): 12.5 mg via ORAL
  Filled 2019-09-23 (×11): qty 1

## 2019-09-23 MED ORDER — SODIUM CHLORIDE 0.9 % IV SOLN
500.0000 mg | INTRAVENOUS | Status: DC
  Administered 2019-09-24 – 2019-09-27 (×5): 500 mg via INTRAVENOUS
  Filled 2019-09-23 (×5): qty 500

## 2019-09-23 MED ORDER — GUAIFENESIN-DM 100-10 MG/5ML PO SYRP: 10.0000 mL | ORAL_SOLUTION | ORAL | Status: DC | PRN

## 2019-09-23 MED ORDER — IPRATROPIUM-ALBUTEROL 20-100 MCG/ACT IN AERS
1.0000 | INHALATION_SPRAY | Freq: Four times a day (QID) | RESPIRATORY_TRACT | Status: DC
  Administered 2019-09-23 – 2019-09-28 (×21): 1 via RESPIRATORY_TRACT
  Filled 2019-09-23: qty 4

## 2019-09-23 MED ORDER — LINAGLIPTIN 5 MG PO TABS
5.0000 mg | ORAL_TABLET | Freq: Every day | ORAL | Status: DC
  Administered 2019-09-23 – 2019-10-04 (×12): 5 mg via ORAL
  Filled 2019-09-23 (×12): qty 1

## 2019-09-23 MED ORDER — OXYBUTYNIN CHLORIDE ER 5 MG PO TB24
5.0000 mg | ORAL_TABLET | Freq: Every day | ORAL | Status: DC
  Administered 2019-09-23 – 2019-10-03 (×11): 5 mg via ORAL
  Filled 2019-09-23 (×12): qty 1

## 2019-09-23 MED ORDER — ENOXAPARIN SODIUM 40 MG/0.4ML ~~LOC~~ SOLN
40.0000 mg | SUBCUTANEOUS | Status: DC
Start: 2019-09-23 — End: 2019-09-23

## 2019-09-23 MED ORDER — SODIUM CHLORIDE 0.9 % IV SOLN
100.0000 mg | INTRAVENOUS | Status: AC
  Administered 2019-09-23 – 2019-09-24 (×2): 100 mg via INTRAVENOUS
  Filled 2019-09-23: qty 20

## 2019-09-23 MED ORDER — BACLOFEN 20 MG PO TABS
20.0000 mg | ORAL_TABLET | Freq: Three times a day (TID) | ORAL | Status: DC
  Administered 2019-09-23 – 2019-10-04 (×33): 20 mg via ORAL
  Filled 2019-09-23: qty 1
  Filled 2019-09-23 (×5): qty 2
  Filled 2019-09-23: qty 1
  Filled 2019-09-23 (×4): qty 2
  Filled 2019-09-23: qty 1
  Filled 2019-09-23 (×5): qty 2
  Filled 2019-09-23: qty 1
  Filled 2019-09-23 (×7): qty 2
  Filled 2019-09-23: qty 1
  Filled 2019-09-23: qty 2
  Filled 2019-09-23: qty 1
  Filled 2019-09-23 (×2): qty 2
  Filled 2019-09-23 (×3): qty 1

## 2019-09-23 MED ORDER — CLONIDINE HCL 0.1 MG PO TABS
0.1000 mg | ORAL_TABLET | Freq: Two times a day (BID) | ORAL | Status: DC
  Administered 2019-09-23 – 2019-10-04 (×22): 0.1 mg via ORAL
  Filled 2019-09-23 (×22): qty 1

## 2019-09-23 MED ORDER — ASCORBIC ACID 500 MG PO TABS
500.0000 mg | ORAL_TABLET | Freq: Every day | ORAL | Status: DC
  Administered 2019-09-23 – 2019-10-04 (×12): 500 mg via ORAL
  Filled 2019-09-23 (×12): qty 1

## 2019-09-23 MED ORDER — SODIUM CHLORIDE 0.9 % IV SOLN
100.0000 mg | Freq: Every day | INTRAVENOUS | Status: DC
Start: 2019-09-24 — End: 2019-09-23

## 2019-09-23 MED ORDER — HEPARIN SODIUM (PORCINE) 5000 UNIT/ML IJ SOLN
5000.0000 [IU] | Freq: Three times a day (TID) | INTRAMUSCULAR | Status: DC
  Administered 2019-09-23 – 2019-09-24 (×2): 5000 [IU] via SUBCUTANEOUS
  Filled 2019-09-23 (×2): qty 1

## 2019-09-23 MED ORDER — ASPIRIN EC 81 MG PO TBEC
81.0000 mg | DELAYED_RELEASE_TABLET | Freq: Every day | ORAL | Status: DC
  Administered 2019-09-24 – 2019-09-29 (×6): 81 mg via ORAL
  Filled 2019-09-23 (×6): qty 1

## 2019-09-23 MED ORDER — SODIUM CHLORIDE 0.9 % IV SOLN
100.0000 mg | Freq: Every day | INTRAVENOUS | Status: AC
  Administered 2019-09-24 – 2019-09-27 (×4): 100 mg via INTRAVENOUS
  Filled 2019-09-23 (×5): qty 20

## 2019-09-23 MED ORDER — ZINC SULFATE 220 (50 ZN) MG PO CAPS
220.0000 mg | ORAL_CAPSULE | Freq: Every day | ORAL | Status: DC
  Administered 2019-09-23 – 2019-10-04 (×12): 220 mg via ORAL
  Filled 2019-09-23 (×12): qty 1

## 2019-09-23 MED ORDER — INSULIN DETEMIR 100 UNIT/ML ~~LOC~~ SOLN
0.0750 [IU]/kg | Freq: Two times a day (BID) | SUBCUTANEOUS | Status: DC
  Administered 2019-09-23 – 2019-10-04 (×22): 6 [IU] via SUBCUTANEOUS
  Filled 2019-09-23 (×23): qty 0.06

## 2019-09-23 MED ORDER — IRBESARTAN 150 MG PO TABS
150.0000 mg | ORAL_TABLET | Freq: Every day | ORAL | Status: DC
  Administered 2019-09-24 – 2019-10-04 (×11): 150 mg via ORAL
  Filled 2019-09-23 (×11): qty 1

## 2019-09-23 MED ORDER — TRAZODONE HCL 50 MG PO TABS
50.0000 mg | ORAL_TABLET | Freq: Every day | ORAL | Status: DC
  Administered 2019-09-23 – 2019-10-03 (×11): 50 mg via ORAL
  Filled 2019-09-23 (×11): qty 1

## 2019-09-23 NOTE — H&P (Signed)
History and Physical   Larry [name] FHL:[id number] DOB: 1950-11-19 DOA: 09/23/2019  Referring MD/NP/PA: Dr. Tyrone Nine  PCP: Lorayne Marek Charlene Brooke, NP   Outpatient Specialists: Dr. Krista Blue, neurology  Patient coming from: Home  Chief Complaint: Shortness of breath and cough  HPI: Larry [name] is a 69 y.o. male with medical history significant of multiple sclerosis, spinal cord injury currently on wheelchair, diabetes, hyperlipidemia, carpal tunnel syndrome, kidney stones, hypertension who came from the home with shortness of breath, wheezing as well as cough.  Patient was exposed to his niece who was diagnosed with COVID-19 about 2 weeks ago.  He has some low-grade fever but no chills.  Denied any nausea vomiting or abdominal pain.  Patient's oxygen sats were 58% on arrival by EMS.  He was also 78% on arrival in the ER.  He is currently on oxygen by nonrebreather bag with oxygen sat roughly around 90%.  He denied any chest pain.  Denied any other complaints.  COVID-19 screen was positive and chest x-ray showed bilateral pneumonia.  Patient is being admitted to the hospital for treatment..  ED Course: Temperature 98 blood pressure 133/104 pulse 87 respirate of 33 oxygen sat 78% on room air.  ABG showed a pH of 7.453 PCO2 35 and PO2 of 59%.  LDH is 467 troponin I 25.  Ferritin 217 CRP of 12.5 procalcitonin 1.14 D-dimer is more than 20 fibrinogen 581.  COVID-19 screen is positive.  Chest x-ray shows bilateral lower lung airspace disease and consolidation.  CBC and CMP are largely within normal.  Patient is being admitted with acute respiratory failure secondary to COVID-19 pneumonia.  Review of Systems: As per HPI otherwise 10 point review of systems negative.    Past Medical History:  Diagnosis Date  . Abnormality of gait   . Acute delirium 12/11/2011  . CTS (carpal tunnel syndrome) 09/26/2015  . Diabetes mellitus   . Hypercholesterolemia   . Hypertension   . Kidney stones   . Leukocytopenia  05/22/2013  . MS (multiple sclerosis) [facility])    sees [facility]  . T10 spinal cord injury ([place]) 12/12/2011   hx surgery 1996 with bilat gait abnormality since    Past Surgical History:  Procedure Laterality Date  . BACK SURGERY    . COLONOSCOPY    . LUMBAR LAMINECTOMY       reports that he has never smoked. He has never used smokeless tobacco. He reports that he does not drink alcohol or use drugs.  Allergies  Allergen Reactions  . No Known Allergies     Family History  Problem Relation Age of Onset  . Hypertension Mother   . Liver cancer Father   . Hypertension Sister      Prior to Admission medications   Medication Sig Start Date End Date Taking? Authorizing Provider  aspirin 81 MG tablet Take 81 mg by mouth daily.   Yes [provider]  baclofen (LIORESAL) 20 MG tablet TAKE 1 TABLET BY MOUTH 3  TIMES DAILY 02/20/19  Yes Marcial Pacas, MD  cloNIDine (CATAPRES) 0.1 MG tablet Take 1 tablet (0.1 mg total) by mouth 2 (two) times daily. 03/27/19  Yes Nche, Charlene Brooke, NP  finasteride (PROSCAR) 5 MG tablet Take 1 tablet (5 mg total) by mouth daily. 03/27/19  Yes Nche, Charlene Brooke, NP  metFORMIN (GLUCOPHAGE) 850 MG tablet Take 1 tablet (850 mg total) by mouth 2 (two) times daily with a meal. Need office visit for additional refills Patient taking differently: Take 850  mg by mouth 2 (two) times daily with a meal.  08/07/19  Yes Nche, Charlene Brooke, NP  Multiple Vitamin (MULTIVITAMIN) tablet Take 1 tablet by mouth daily.   Yes [provider]  Olmesartan-amLODIPine-HCTZ 40-10-12.5 MG TABS TAKE 1 TABLET BY MOUTH  DAILY 02/20/19  Yes Nche, Charlene Brooke, NP  oxybutynin (DITROPAN-XL) 5 MG 24 hr tablet Take 1 tablet (5 mg total) by mouth at bedtime. 09/14/19  Yes Nche, Charlene Brooke, NP  pregabalin (LYRICA) 75 MG capsule Take 1 capsule (75 mg total) by mouth 2 (two) times daily. 09/14/19  Yes Nche, Charlene Brooke, NP  traZODone (DESYREL) 50 MG tablet Take 1 tablet (50  mg total) by mouth at bedtime. 09/14/19  Yes Nche, Charlene Brooke, NP  ACCU-CHEK AVIVA PLUS test strip TEST ONCE DAILY (MORNING  FASTING) 10/11/18   Nche, Charlene Brooke, NP  Accu-Chek FastClix Lancets MISC  11/01/18   [provider]  Blood Glucose Calibration (ACCU-CHEK AVIVA) SOLN  10/15/11   [provider]  ferrous sulfate 325 (65 FE) MG tablet Take 1 tablet (325 mg total) by mouth daily with breakfast. Patient not taking: Reported on 09/23/2019 03/27/19   Nche, Charlene Brooke, NP  Incontinence Supply Disposable (DEPEND ADJUSTABLE UNDERWEAR LG) MISC Use as needed. 01/01/17   Marcial Pacas, MD  Lancets Misc. (ACCU-CHEK FASTCLIX LANCET) KIT 1 Units by Does not apply route daily before breakfast. 02/09/18   Nche, Charlene Brooke, NP    Physical Exam: Vitals:   09/23/19 1435 09/23/19 1436 09/23/19 1555 09/23/19 1830  BP:  (!) 133/104 (!) 130/106 118/71  Pulse:  84 87 78  Resp:  (!) 36  (!) 27  Temp:  98 F (36.7 C)    TempSrc:  Oral    SpO2: (!) 78% 96% 91% 94%  Weight:  74.8 kg    Height:  _0  (1.702 m)        Constitutional: Anxious, acutely ill looking on nonrebreather back, tachypneic Vitals:   09/23/19 1435 09/23/19 1436 09/23/19 1555 09/23/19 1830  BP:  (!) 133/104 (!) 130/106 118/71  Pulse:  84 87 78  Resp:  (!) 36  (!) 27  Temp:  98 F (36.7 C)    TempSrc:  Oral    SpO2: (!) 78% 96% 91% 94%  Weight:  74.8 kg    Height:  _1  (1.702 m)     Eyes: PERRL, lids and conjunctivae normal ENMT: Mucous membranes are moist. Posterior pharynx clear of any exudate or lesions.Normal dentition.  Neck: normal, supple, no masses, no thyromegaly Respiratory: Decreased air entry, with coarse breath sound, mild expiratory wheezing, bilateral crackles and rails, use of extra muscle respiration and increased respiratory effort on nonrebreather bag Cardiovascular: Sinus tachycardia no murmurs / rubs / gallops. No extremity edema. 2+ pedal pulses. No carotid bruits.  Abdomen: no  tenderness, no masses palpated. No hepatosplenomegaly. Bowel sounds positive.  Musculoskeletal: no clubbing / cyanosis. No joint deformity upper and lower extremities. Good ROM, loss of muscle tone in the lower extremities skin: no rashes, lesions, ulcers. No induration Neurologic: CN 2-12 grossly intact.  Paraplegic psychiatric: Normal judgment and insight. Alert and oriented x 3.  Anxious mood.     Labs on Admission: I have personally reviewed following labs and imaging studies  CBC: Recent Labs  Lab 09/23/19 1449  WBC 8.9  NEUTROABS 8.0*  HGB 11.6*  HCT 34.6*  MCV 95.3  PLT 388   Basic Metabolic Panel: Recent Labs  Lab 09/23/19 1449  NA 141  K 3.9  CL 102  CO2 26  GLUCOSE 233*  BUN 59*  CREATININE 2.38*  CALCIUM 8.8*   GFR: Estimated Creatinine Clearance: 26.6 mL/min (A) (by C-G formula based on SCr of 2.38 mg/dL (H)). Liver Function Tests: Recent Labs  Lab 09/23/19 1449  AST 55*  ALT 18  ALKPHOS 66  BILITOT 0.8  PROT 6.9  ALBUMIN 3.3*   No results for input(s): LIPASE, AMYLASE in the last 168 hours. No results for input(s): AMMONIA in the last 168 hours. Coagulation Profile: No results for input(s): INR, PROTIME in the last 168 hours. Cardiac Enzymes: No results for input(s): CKTOTAL, CKMB, CKMBINDEX, TROPONINI in the last 168 hours. BNP (last 3 results) Recent Labs    03/24/19 1555  PROBNP 33.0   HbA1C: No results for input(s): HGBA1C in the last 72 hours. CBG: No results for input(s): GLUCAP in the last 168 hours. Lipid Profile: No results for input(s): CHOL, HDL, LDLCALC, TRIG, CHOLHDL, LDLDIRECT in the last 72 hours. Thyroid Function Tests: No results for input(s): TSH, T4TOTAL, FREET4, T3FREE, THYROIDAB in the last 72 hours. Anemia Panel: No results for input(s): VITAMINB12, FOLATE, FERRITIN, TIBC, IRON, RETICCTPCT in the last 72 hours. Urine analysis:    Component Value Date/Time   COLORURINE YELLOW 06/10/2018 1122   APPEARANCEUR CLEAR  06/10/2018 1122   LABSPEC 1.015 06/10/2018 1122   PHURINE 6.5 06/10/2018 1122   GLUCOSEU NEGATIVE 06/10/2018 [place] 06/10/2018 1122   BILIRUBINUR NEGATIVE 01/01/2016 1244   KETONESUR NEGATIVE 06/10/2018 1122   PROTEINUR NEGATIVE 06/10/2018 1122   UROBILINOGEN 0.2 12/10/2011 1740   NITRITE NEGATIVE 01/01/2016 1244   LEUKOCYTESUR NEGATIVE 01/01/2016 1244   Sepsis Labs: _0 (procalcitonin:4,lacticidven:4) )No results found for this or any previous visit (from the past 240 hour(s)).   Radiological Exams on Admission: DG Chest Portable 1 View  Result Date: 09/23/2019 CLINICAL DATA:  Acute shortness of breath and hypoxia. EXAM: PORTABLE CHEST 1 VIEW COMPARISON:  12/10/2011 chest radiograph FINDINGS: Bilateral LOWER lung airspace disease/consolidation noted-favor infection. There may be trace bilateral pleural effusions present. No pneumothorax. The cardiomediastinal silhouette is obscured. No acute bony abnormalities are present. IMPRESSION: Bilateral LOWER lung airspace disease/consolidation suspicious for infection. Electronically Signed   By: Margarette Canada M.D.   On: 09/23/2019 15:18    EKG: Independently reviewed.  It shows sinus rhythm with a rate of 95, nonspecific ST changes.  Assessment/Plan Principal Problem:   Acute respiratory failure due to COVID-19 [facility]) Active Problems:   Paraplegia (HCC)   DM (diabetes mellitus) ([place])   MS (multiple sclerosis) ([place])   Hypertension   Overactive bladder   History of spinal cord injury   GERD (gastroesophageal reflux disease)   Benign prostatic hyperplasia with lower urinary tract symptoms     #1 acute respiratory failure secondary to COVID-19 pneumonia: Patient will be admitted to stepdown unit.  He is currently on nonrebreather back.  May need to get on BiPAP or higher flow oxygen.  Initiate remdesivir, add Actemra, Decadron, antibiotics and supportive care.  Follow daily markers.  #2 diabetes: Continue blood sugar  control with sliding scale.  #3 multiple sclerosis: Continue home regimen.  #4 paraplegia: Secondary to T10 compression fracture.  Has been stable with overactive bladder.  Continue monitoring.  #5 hypertension: Continue amlodipine, hydrochlorothiazide, Avapro and clonidine.  #6 GERD: Continue with PPIs  #7 elevated troponin: Cycle enzymes.  Most likely secondary to Covid 19 infection.  May get echocardiogram if cardiac decompensation.  DVT prophylaxis: Lovenox Code Status: Full code Family Communication: Daughter in the room Disposition Plan: Home Consults called: None Admission status: Inpatient  Severity of Illness: The appropriate patient status for this patient is INPATIENT. Inpatient status is judged to be reasonable and necessary in order to provide the required intensity of service to ensure the patient's safety. The patient's presenting symptoms, physical exam findings, and initial radiographic and laboratory data in the context of their chronic comorbidities is felt to place them at high risk for further clinical deterioration. Furthermore, it is not anticipated that the patient will be medically stable for discharge from the hospital within 2 midnights of admission. The following factors support the patient status of inpatient.   " The patient's presenting symptoms include shortness of breath and cough. " The worrisome physical exam findings include tachypnea. " The initial radiographic and laboratory data are worrisome because of bilateral pneumonia with elevated markers and COVID-19 positive. " The chronic co-morbidities include diabetes with paraplegia and MS.   * I certify that at the point of admission it is my clinical judgment that the patient will require inpatient hospital care spanning beyond 2 midnights from the point of admission due to high intensity of service, high risk for further deterioration and high frequency of surveillance required.Barbette Merino  MD [facility] Pager [phone]  If 7PM-7AM, please contact night-coverage [email or web address] Password [facility]  09/23/2019, 7:51 PM

## 2019-09-23 NOTE — ED Provider Notes (Signed)
[facility] DEPT Provider Note   CSN: [id number] Arrival date & time: 09/23/19  1422     History Chief Complaint  Patient presents with  . Shortness of Breath    Larry [name] is a 69 y.o. male.  HPI Patient presents with shortness of breath.  Began last night.  Is in a wheelchair at baseline due to his previous spinal injury and MS.  Oxygen sats went to 78% and reportedly were 58% upon arrival.  No fevers.  No cough.  No chest pain no swelling in his legs.  No pulmonary history.  States he was around his niece that on April 5 was positive for Covid.  No abdominal pain.    Past Medical History:  Diagnosis Date  . Abnormality of gait   . Acute delirium 12/11/2011  . CTS (carpal tunnel syndrome) 09/26/2015  . Diabetes mellitus   . Hypercholesterolemia   . Hypertension   . Kidney stones   . Leukocytopenia 05/22/2013  . MS (multiple sclerosis) [facility])    sees [facility]  . T10 spinal cord injury ([place]) 12/12/2011   hx surgery 1996 with bilat gait abnormality since    Patient Active Problem List   Diagnosis Date Noted  . Benign prostatic hyperplasia with lower urinary tract symptoms 09/14/2019  . Cervical stenosis of spinal canal 05/09/2019  . Thoracic spinal stenosis 05/09/2019  . Gait abnormality 08/08/2018  . Spinal stenosis 08/08/2018  . Iron deficiency anemia 02/09/2018  . Bowel incontinence 01/01/2017  . Vitamin D deficiency 01/11/2016  . GERD (gastroesophageal reflux disease) 01/11/2016  . Spinal stenosis, lumbar region, with neurogenic claudication 01/10/2016  . Spinal stenosis of lumbar region with radiculopathy 01/10/2016  . Fever 01/01/2016  . Acute blood loss anemia 01/01/2016  . Elective surgery   . Post-operative pain   . Tachycardia   . Peripheral neuropathy   . Spondylolisthesis of lumbar region 12/30/2015  . CTS (carpal tunnel syndrome) 09/26/2015  . Lumbar stenosis 09/26/2015  . Weakness 09/05/2015  . Spinal  stenosis in cervical region 06/12/2015  . Leukocytopenia 05/22/2013  . History of spinal cord injury 12/12/2011  . Paraplegia ([place]) 12/11/2011  . Altered mental status 12/11/2011  . DM (diabetes mellitus) ([place]) 12/11/2011  . Overactive bladder 12/11/2011  . MS (multiple sclerosis) ([place])   . Hypertension     Past Surgical History:  Procedure Laterality Date  . BACK SURGERY    . COLONOSCOPY    . LUMBAR LAMINECTOMY         Family History  Problem Relation Age of Onset  . Hypertension Mother   . Liver cancer Father   . Hypertension Sister     Social History   Tobacco Use  . Smoking status: Never Smoker  . Smokeless tobacco: Never Used  Substance Use Topics  . Alcohol use: No  . Drug use: No    Home Medications Prior to Admission medications   Medication Sig Start Date End Date Taking? Authorizing Provider  ACCU-CHEK AVIVA PLUS test strip TEST ONCE DAILY (MORNING  FASTING) 10/11/18   Nche, Charlene Brooke, NP  Accu-Chek FastClix Lancets MISC  11/01/18   [provider]  aspirin 81 MG tablet Take 81 mg by mouth daily.    [provider]  baclofen (LIORESAL) 20 MG tablet TAKE 1 TABLET BY MOUTH 3  TIMES DAILY 02/20/19   Marcial Pacas, MD  Blood Glucose Calibration (ACCU-CHEK AVIVA) SOLN  10/15/11   [provider]  cloNIDine (CATAPRES) 0.1 MG tablet  Take 1 tablet (0.1 mg total) by mouth 2 (two) times daily. 03/27/19   Nche, Charlene Brooke, NP  Cyanocobalamin (VITAMIN B-12 PO) Take 1 tablet by mouth 2 (two) times daily. Patient is unsure of dose    [provider]  ferrous sulfate 325 (65 FE) MG tablet Take 1 tablet (325 mg total) by mouth daily with breakfast. 03/27/19   Nche, Charlene Brooke, NP  finasteride (PROSCAR) 5 MG tablet Take 1 tablet (5 mg total) by mouth daily. 03/27/19   Nche, Charlene Brooke, NP  Incontinence Supply Disposable (DEPEND ADJUSTABLE UNDERWEAR LG) MISC Use as needed. 01/01/17   Marcial Pacas, MD  Lancets Misc. (ACCU-CHEK FASTCLIX  LANCET) KIT 1 Units by Does not apply route daily before breakfast. 02/09/18   Nche, Charlene Brooke, NP  metFORMIN (GLUCOPHAGE) 850 MG tablet Take 1 tablet (850 mg total) by mouth 2 (two) times daily with a meal. Need office visit for additional refills 08/07/19   Nche, Charlene Brooke, NP  Multiple Vitamin (MULTIVITAMIN) tablet Take 1 tablet by mouth daily.    [provider]  Olmesartan-amLODIPine-HCTZ 40-10-12.5 MG TABS TAKE 1 TABLET BY MOUTH  DAILY 02/20/19   Nche, Charlene Brooke, NP  oxybutynin (DITROPAN-XL) 5 MG 24 hr tablet Take 1 tablet (5 mg total) by mouth at bedtime. 09/14/19   Nche, Charlene Brooke, NP  pregabalin (LYRICA) 75 MG capsule Take 1 capsule (75 mg total) by mouth 2 (two) times daily. 09/14/19   Nche, Charlene Brooke, NP  traZODone (DESYREL) 50 MG tablet Take 1 tablet (50 mg total) by mouth at bedtime. 09/14/19   Nche, Charlene Brooke, NP    Allergies    No known allergies  Review of Systems   Review of Systems  Constitutional: Negative for appetite change, fatigue and fever.  HENT: Negative for congestion.   Respiratory: Positive for shortness of breath. Negative for cough.   Cardiovascular: Negative for chest pain and leg swelling.  Gastrointestinal: Negative for abdominal distention.  Genitourinary: Negative for flank pain.  Musculoskeletal: Negative for back pain.  Neurological: Positive for weakness.  Psychiatric/Behavioral: Negative for confusion.    Physical Exam Updated Vital Signs BP (!) 133/104 (BP Location: Right Arm)   Pulse 84   Temp 98 F (36.7 C) (Oral)   Resp (!) 36   Ht 5' 7" (1.702 m)   Wt 74.8 kg   SpO2 96%   BMI 25.84 kg/m   Physical Exam Vitals and nursing note reviewed.  HENT:     Head: Atraumatic.  Cardiovascular:     Rate and Rhythm: Normal rate and regular rhythm.  Pulmonary:     Comments: Harsh breath sounds at left base.  Tachypnea Chest:     Chest wall: No tenderness.  Abdominal:     Tenderness: There is no abdominal  tenderness.  Musculoskeletal:     Cervical back: Neck supple.     Right lower leg: No tenderness.     Left lower leg: No tenderness.     Comments: Mild edema bilateral lower extremities  Skin:    General: Skin is warm.     Capillary Refill: Capillary refill takes less than 2 seconds.  Neurological:     Mental Status: He is alert and oriented to person, place, and time.     ED Results / Procedures / Treatments   Labs (all labs ordered are listed, but only abnormal results are displayed) Labs Reviewed  CULTURE, BLOOD (ROUTINE X 2)  CULTURE, BLOOD (ROUTINE X 2)  LACTIC ACID,  PLASMA  LACTIC ACID, PLASMA  COMPREHENSIVE METABOLIC PANEL  CBC WITH DIFFERENTIAL/PLATELET  POC SARS CORONAVIRUS 2 AG -  ED    EKG None  Radiology No results found.  Procedures Procedures (including critical care time)  Medications Ordered in ED Medications - No data to display  ED Course  I have reviewed the triage vital signs and the nursing notes.  Pertinent labs & imaging results that were available during my care of the patient were reviewed by me and considered in my medical decision making (see chart for details).    MDM Rules/Calculators/A&P                     Patient with shortness of breath and proxy appeared reportedly began last night.  History of MS.  Possibly had exposure to someone with Covid, however likely would not of been infectious to the point that he saw her.  Does have MS.  Severely hypoxic on arrival.  Will get x-ray and may require CT scan due to immobility.  Covid test also pending.  Care turned over to Dr. Tyrone Nine. Final Clinical Impression(s) / ED Diagnoses Final diagnoses:  None    Rx / DC Orders ED Discharge Orders    None       Davonna Belling, MD 09/23/19 1514

## 2019-09-23 NOTE — ED Triage Notes (Signed)
Per EMS, patient from home, presenting with exertional SOB when transferring from chair to wheelchair. Oxygen dropped to 78% on RA on exertion. Placed on NRB with EMS. 92% on NRB. Patient c/o ringing in bilateral ears. Hx MS.   20g L AC  BP 154/90 HR 100

## 2019-09-23 NOTE — ED Provider Notes (Addendum)
Received the patient in signout from Dr. Alvino Chapel.  Briefly the patient is a 69 year old male with a chief complaint of shortness of breath.  Found to be significantly hypoxic with an oxygen saturation in the 50s.  Improved with 15 L/min.  Plan for lab work chest x-ray and admission.  ABG without hypercapnia.  Found to have the novel coronavirus.  Significantly altered on my exam.  Discussed with the family and this is sometimes common for him when he gets ill.  AKI given a bolus of fluids.  Admit.   CRITICAL CARE Performed by: Cecilio Asper   Total critical care time: 35 minutes  Critical care time was exclusive of separately billable procedures and treating other patients.  Critical care was necessary to treat or prevent imminent or life-threatening deterioration.  Critical care was time spent personally by me on the following activities: development of treatment plan with patient and/or surrogate as well as nursing, discussions with consultants, evaluation of patient's response to treatment, examination of patient, obtaining history from patient or surrogate, ordering and performing treatments and interventions, ordering and review of laboratory studies, ordering and review of radiographic studies, pulse oximetry and re-evaluation of patient's condition.    Deno Etienne, DO 09/23/19 [place], [place], DO 09/23/19 2308

## 2019-09-23 NOTE — ED Notes (Signed)
Respiratory called for ABG

## 2019-09-23 NOTE — ED Notes (Signed)
ED Provider at bedside. 

## 2019-09-23 NOTE — ED Notes (Signed)
XR at bedside

## 2019-09-24 ENCOUNTER — Other Ambulatory Visit: Payer: Self-pay

## 2019-09-24 DIAGNOSIS — J9600 Acute respiratory failure, unspecified whether with hypoxia or hypercapnia: Secondary | ICD-10-CM | POA: Diagnosis not present

## 2019-09-24 DIAGNOSIS — U071 COVID-19: Secondary | ICD-10-CM | POA: Diagnosis not present

## 2019-09-24 LAB — CBC WITH DIFFERENTIAL/PLATELET
Abs Immature Granulocytes: 0.4 10*3/uL — ABNORMAL HIGH (ref 0.00–0.07)
Basophils Absolute: 0.1 10*3/uL (ref 0.0–0.1)
Basophils Relative: 1 %
Eosinophils Absolute: 0 10*3/uL (ref 0.0–0.5)
Eosinophils Relative: 0 %
HCT: 37 % — ABNORMAL LOW (ref 39.0–52.0)
Hemoglobin: 12.2 g/dL — ABNORMAL LOW (ref 13.0–17.0)
Immature Granulocytes: 7 %
Lymphocytes Relative: 4 %
Lymphs Abs: 0.2 10*3/uL — ABNORMAL LOW (ref 0.7–4.0)
MCH: 31.3 pg (ref 26.0–34.0)
MCHC: 33 g/dL (ref 30.0–36.0)
MCV: 94.9 fL (ref 80.0–100.0)
Monocytes Absolute: 0.1 10*3/uL (ref 0.1–1.0)
Monocytes Relative: 2 %
Neutro Abs: 5.2 10*3/uL (ref 1.7–7.7)
Neutrophils Relative %: 86 %
Platelets: 342 10*3/uL (ref 150–400)
RBC: 3.9 MIL/uL — ABNORMAL LOW (ref 4.22–5.81)
RDW: 14.4 % (ref 11.5–15.5)
WBC: 6 10*3/uL (ref 4.0–10.5)
nRBC: 1 % — ABNORMAL HIGH (ref 0.0–0.2)

## 2019-09-24 LAB — LACTATE DEHYDROGENASE: LDH: 467 U/L — ABNORMAL HIGH (ref 98–192)

## 2019-09-24 LAB — GLUCOSE, CAPILLARY
Glucose-Capillary: 144 mg/dL — ABNORMAL HIGH (ref 70–99)
Glucose-Capillary: 163 mg/dL — ABNORMAL HIGH (ref 70–99)

## 2019-09-24 LAB — PROCALCITONIN: Procalcitonin: 1.14 ng/mL

## 2019-09-24 LAB — D-DIMER, QUANTITATIVE
D-Dimer, Quant: >20 ug/mL-FEU — ABNORMAL HIGH (ref 0.00–0.50)
D-Dimer, Quant: >20 ug/mL-FEU — ABNORMAL HIGH (ref 0.00–0.50)

## 2019-09-24 LAB — COMPREHENSIVE METABOLIC PANEL
ALT: 18 U/L (ref 0–44)
AST: 60 U/L — ABNORMAL HIGH (ref 15–41)
Albumin: 3.1 g/dL — ABNORMAL LOW (ref 3.5–5.0)
Alkaline Phosphatase: 67 U/L (ref 38–126)
Anion gap: 13 (ref 5–15)
BUN: 52 mg/dL — ABNORMAL HIGH (ref 8–23)
CO2: 25 mmol/L (ref 22–32)
Calcium: 8.7 mg/dL — ABNORMAL LOW (ref 8.9–10.3)
Chloride: 107 mmol/L (ref 98–111)
Creatinine, Ser: 1.6 mg/dL — ABNORMAL HIGH (ref 0.61–1.24)
GFR calc Af Amer: 49 mL/min — ABNORMAL LOW (ref >60)
GFR calc non Af Amer: 43 mL/min — ABNORMAL LOW (ref >60)
Glucose, Bld: 125 mg/dL — ABNORMAL HIGH (ref 70–99)
Potassium: 4.1 mmol/L (ref 3.5–5.1)
Sodium: 145 mmol/L (ref 135–145)
Total Bilirubin: 0.8 mg/dL (ref 0.3–1.2)
Total Protein: 7 g/dL (ref 6.5–8.1)

## 2019-09-24 LAB — ABO/RH: ABO/RH(D): B POS

## 2019-09-24 LAB — TROPONIN I (HIGH SENSITIVITY)
Troponin I (High Sensitivity): 125 ng/L (ref <18)
Troponin I (High Sensitivity): 137 ng/L (ref <18)
Troponin I (High Sensitivity): 155 ng/L (ref <18)

## 2019-09-24 LAB — FIBRINOGEN: Fibrinogen: 581 mg/dL — ABNORMAL HIGH (ref 210–475)

## 2019-09-24 LAB — CBG MONITORING, ED
Glucose-Capillary: 123 mg/dL — ABNORMAL HIGH (ref 70–99)
Glucose-Capillary: 109 mg/dL — ABNORMAL HIGH (ref 70–99)
Glucose-Capillary: 147 mg/dL — ABNORMAL HIGH (ref 70–99)
Glucose-Capillary: 138 mg/dL — ABNORMAL HIGH (ref 70–99)

## 2019-09-24 LAB — MRSA PCR SCREENING: MRSA by PCR: NEGATIVE

## 2019-09-24 LAB — C-REACTIVE PROTEIN
CRP: 12.5 mg/dL — ABNORMAL HIGH (ref <1.0)
CRP: 12.2 mg/dL — ABNORMAL HIGH (ref <1.0)

## 2019-09-24 LAB — FERRITIN
Ferritin: 217 ng/mL (ref 24–336)
Ferritin: 209 ng/mL (ref 24–336)

## 2019-09-24 MED ORDER — CHLORHEXIDINE GLUCONATE CLOTH 2 % EX PADS
6.0000 | MEDICATED_PAD | Freq: Every day | CUTANEOUS | Status: DC
  Administered 2019-09-24 – 2019-10-01 (×9): 6 via TOPICAL

## 2019-09-24 MED ORDER — TOCILIZUMAB 400 MG/20ML IV SOLN
600.0000 mg | Freq: Once | INTRAVENOUS | Status: AC
  Administered 2019-09-24: 600 mg via INTRAVENOUS
  Filled 2019-09-24: qty 20

## 2019-09-24 MED ORDER — HEPARIN SODIUM (PORCINE) 10000 UNIT/ML IJ SOLN
7500.0000 [IU] | Freq: Three times a day (TID) | INTRAMUSCULAR | Status: DC
  Administered 2019-09-24: 7500 [IU] via SUBCUTANEOUS
  Filled 2019-09-24 (×4): qty 1

## 2019-09-24 MED ORDER — ORAL CARE MOUTH RINSE
15.0000 mL | Freq: Two times a day (BID) | OROMUCOSAL | Status: DC
  Administered 2019-09-24 – 2019-10-04 (×19): 15 mL via OROMUCOSAL

## 2019-09-24 MED ORDER — HEPARIN SODIUM (PORCINE) 10000 UNIT/ML IJ SOLN
7500.0000 [IU] | Freq: Three times a day (TID) | INTRAMUSCULAR | Status: DC
  Administered 2019-09-24 – 2019-09-25 (×3): 7500 [IU] via SUBCUTANEOUS
  Filled 2019-09-24 (×4): qty 0.75

## 2019-09-24 NOTE — ED Notes (Signed)
ED TO INPATIENT HANDOFF REPORT  Name/Age/Gender Larry [name] 69 y.o. male  Code Status    Code Status Orders  (From admission, onward)         Start     Ordered   09/23/19 2149  Full code  Continuous     09/23/19 2149        Code Status History    Date Active Date Inactive Code Status Order ID Comments User Context   12/30/2015 2000 01/05/2016 1910 Full Code [id number]  Kristeen Miss, MD Inpatient   Advance Care Planning Activity      Home/SNF/Other Home  Chief Complaint Acute respiratory failure due to COVID-19 ([place]) [U07.1, J96.00]  Level of Care/Admitting Diagnosis ED Disposition    ED Disposition Condition [facility] Area: [facility] [[id number]]  Level of Care: Stepdown [14]  Admit to SDU based on following criteria: Respiratory Distress:  Frequent assessment and/or intervention to maintain adequate ventilation/respiration, pulmonary toilet, and respiratory treatment.  May admit patient to [facility] or [facility] if equivalent level of care is available:: Yes  Covid Evaluation: Confirmed COVID Positive  Diagnosis: Acute respiratory failure due to COVID-19 [facility] [[id number]]  Admitting Physician: Elwyn Reach [[id number]]  Attending Physician: Elwyn Reach [[id number]]  Estimated length of stay: past midnight tomorrow  Certification:: I certify this patient will need inpatient services for at least 2 midnights       Medical History Past Medical History:  Diagnosis Date  . Abnormality of gait   . Acute delirium 12/11/2011  . CTS (carpal tunnel syndrome) 09/26/2015  . Diabetes mellitus   . Hypercholesterolemia   . Hypertension   . Kidney stones   . Leukocytopenia 05/22/2013  . MS (multiple sclerosis) [facility])    sees [facility]  . 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 No Known Allergies     IV  Location/Drains/Wounds Patient Lines/Drains/Airways Status   Active Line/Drains/Airways    Name:   Placement date:   Placement time:   Site:   Days:   Peripheral IV 09/23/19 Left Antecubital   09/23/19    1831    Antecubital   1   Peripheral IV 09/23/19 Right Antecubital   09/23/19    2305    Antecubital   1   External Urinary Catheter   09/23/19    1330    --   1          Labs/Imaging Results for orders placed or performed during the hospital encounter of 09/23/19 (from the past 48 hour(s))  Lactic acid, plasma     Status: None   Collection Time: 09/23/19  2:49 PM  Result Value Ref [place]   Lactic Acid, Venous 1.7 0.5 - 1.9 mmol/L    Comment: Performed at [facility], [place] [address] [place], [place] [address]  Culture, blood (routine x 2)     Status: None (Preliminary result)   Collection Time: 09/23/19  2:49 PM   Specimen: BLOOD  Result Value Ref [place]   Specimen Description      BLOOD RIGHT ANTECUBITAL Performed at [facility], [place] [address], [place], [place] [address]    Special Requests      BOTTLES DRAWN AEROBIC AND ANAEROBIC Blood Culture adequate volume Performed at [place] [facility] [address], [place], [place] [address]  Culture      NO GROWTH < 24 HOURS Performed at [facility], [place] [address] [place], [place] [address]    Report Status PENDING   Comprehensive metabolic panel     Status: Abnormal   Collection Time: 09/23/19  2:49 PM  Result Value Ref [place]   Sodium 141 135 - 145 mmol/L   Potassium 3.9 3.5 - 5.1 mmol/L   Chloride 102 98 - 111 mmol/L   CO2 26 22 - 32 mmol/L   Glucose, Bld 233 (H) 70 - 99 mg/dL    Comment: Glucose reference range applies only to samples taken after fasting for at least 8 hours.   BUN 59 (H) 8 - 23 mg/dL   Creatinine, Ser 2.38 (H) 0.61 - 1.24 mg/dL   Calcium 8.8 (L) 8.9 - 10.3 mg/dL   Total Protein 6.9 6.5 - 8.1 g/dL   Albumin 3.3 (L) 3.5 - 5.0 g/dL   AST 55 (H) 15 - 41  U/L   ALT 18 0 - 44 U/L   Alkaline Phosphatase 66 38 - 126 U/L   Total Bilirubin 0.8 0.3 - 1.2 mg/dL   GFR calc non Af Amer 26 (L) >60 mL/min   GFR calc Af Amer 31 (L) >60 mL/min   Anion gap 13 5 - 15    Comment: Performed at [facility], [place] [address], [place], [place] [address]  CBC with Differential     Status: Abnormal   Collection Time: 09/23/19  2:49 PM  Result Value Ref [place]   WBC 8.9 4.0 - 10.5 K/uL   RBC 3.63 (L) 4.22 - 5.81 MIL/uL   Hemoglobin 11.6 (L) 13.0 - 17.0 g/dL   HCT 34.6 (L) 39.0 - 52.0 %   MCV 95.3 80.0 - 100.0 fL   MCH 32.0 26.0 - 34.0 pg   MCHC 33.5 30.0 - 36.0 g/dL   RDW 14.2 11.5 - 15.5 %   Platelets 361 150 - 400 K/uL   nRBC 0.7 (H) 0.0 - 0.2 %   Neutrophils Relative % 91 %   Neutro Abs 8.0 (H) 1.7 - 7.7 K/uL   Lymphocytes Relative 3 %   Lymphs Abs 0.3 (L) 0.7 - 4.0 K/uL   Monocytes Relative 1 %   Monocytes Absolute 0.1 0.1 - 1.0 K/uL   Eosinophils Relative 0 %   Eosinophils Absolute 0.0 0.0 - 0.5 K/uL   Basophils Relative 0 %   Basophils Absolute 0.0 0.0 - 0.1 K/uL   WBC Morphology MILD LEFT SHIFT (1-5% METAS, OCC MYELO, OCC BANDS)     Comment: TOXIC GRANULATION   Immature Granulocytes 5 %   Abs Immature Granulocytes 0.45 (H) 0.00 - 0.07 K/uL    Comment: Performed at [facility], [place] [address] [place], [place] [address]  Culture, blood (routine x 2)     Status: None (Preliminary result)   Collection Time: 09/23/19  2:54 PM   Specimen: BLOOD  Result Value Ref [place]   Specimen Description      BLOOD LEFT ANTECUBITAL Performed at [facility], [place] [address], [place], [place] [address]    Special Requests      BOTTLES DRAWN AEROBIC AND ANAEROBIC Blood Culture adequate volume Performed at [place] [address] [place] [address], [place], [place] [address]    Culture      NO GROWTH < 24 HOURS Performed at [place] [address] [place], [place] [address]  Report Status  PENDING   POC SARS Coronavirus 2 Ag-ED - Nasal Swab (BD Veritor Kit)     Status: Abnormal   Collection Time: 09/23/19  4:23 PM  Result Value Ref [place]   SARS Coronavirus 2 Ag POSITIVE (A) NEGATIVE    Comment: (NOTE) SARS-CoV-2 antigen PRESENT. Positive results indicate the presence of viral antigens, but clinical correlation with patient history and other diagnostic information is necessary to determine patient infection status.  Positive results do not rule out bacterial infection or co-infection  with other viruses. False positive results are rare but can occur, and confirmatory RT-PCR testing may be appropriate in some circumstances. The expected result is Negative. Fact Sheet for Patients: PodPark.tn Fact Sheet for Providers: GiftContent.is  This test is not yet approved or cleared by the [place] FDA and  has been authorized for detection and/or diagnosis of SARS-CoV-2 by FDA under an Emergency Use Authorization (EUA).  This EUA will remain in effect (meaning this test can be used) for the duration of  the COVID-19 declaration under Section 564(b)(1) of the Act, 21 U.S.C. section 360bbb-3(b)(1), unless the a uthorization is terminated or revoked sooner.   Blood gas, arterial     Status: Abnormal   Collection Time: 09/23/19  5:16 PM  Result Value Ref [place]   FIO2 80.00    pH, Arterial 7.453 (H) 7.350 - 7.450   pCO2 arterial 35.3 32.0 - 48.0 mmHg   pO2, Arterial 59.3 (L) 83.0 - 108.0 mmHg   Bicarbonate 24.3 20.0 - 28.0 mmol/L   Acid-Base Excess 1.2 0.0 - 2.0 mmol/L   O2 Saturation 87.7 %   Patient temperature 98.6    Collection site RIGHT RADIAL    Drawn by [id number]    Sample type ARTERIAL    Allens test (pass/fail) PASS PASS    Comment: Performed at [facility], [place] [address] [place], [place] [address]  ABO/Rh     Status: None   Collection Time: 09/23/19  9:49 PM  Result Value Ref [place]    ABO/RH(D)      B POS Performed at [facility], [place] [address] [place], [place] [address]   Troponin I (High Sensitivity)     Status: Abnormal   Collection Time: 09/23/19  9:49 PM  Result Value Ref [place]   Troponin I (High Sensitivity) 125 (HH) <18 ng/L    Comment: CRITICAL RESULT CALLED TO, READ BACK BY AND VERIFIED WITH: TEETERS, RN ON 09/24/19 @ 0003 BY LE (NOTE) Elevated high sensitivity troponin I (hsTnI) values and significant  changes across serial measurements may suggest ACS but many other  chronic and acute conditions are known to elevate hsTnI results.  Refer to the Links section for chest pain algorithms and additional  guidance. Performed at [facility], [place] [address] [place], [place] [address]   Procalcitonin     Status: None   Collection Time: 09/23/19  9:49 PM  Result Value Ref [place]   Procalcitonin 1.14 ng/mL    Comment:        Interpretation: PCT > 0.5 ng/mL and <= 2 ng/mL: Systemic infection (sepsis) is possible, but other conditions are known to elevate PCT as well. (NOTE)       Sepsis PCT Algorithm           Lower Respiratory Tract  Infection PCT Algorithm    ----------------------------     ----------------------------         PCT < 0.25 ng/mL                PCT < 0.10 ng/mL         Strongly encourage             Strongly discourage   discontinuation of antibiotics    initiation of antibiotics    ----------------------------     -----------------------------       PCT 0.25 - 0.50 ng/mL            PCT 0.10 - 0.25 ng/mL               OR       >80% decrease in PCT            Discourage initiation of                                            antibiotics      Encourage discontinuation           of antibiotics    ----------------------------     -----------------------------         PCT >= 0.50 ng/mL              PCT 0.26 - 0.50 ng/mL                AND       <80% decrease in  PCT             Encourage initiation of                                             antibiotics       Encourage continuation           of antibiotics    ----------------------------     -----------------------------        PCT >= 0.50 ng/mL                  PCT > 0.50 ng/mL               AND         increase in PCT                  Strongly encourage                                      initiation of antibiotics    Strongly encourage escalation           of antibiotics                                     -----------------------------                                           PCT <= 0.25 ng/mL  OR                                        > 80% decrease in PCT                                     Discontinue / Do not initiate                                             antibiotics Performed at [place] [address], [place], [place] [address]   Lactate dehydrogenase     Status: Abnormal   Collection Time: 09/23/19  9:49 PM  Result Value Ref [place]   LDH 467 (H) 98 - 192 U/L    Comment: Performed at [facility], [place] [address] [place], [place] [address]  Fibrinogen     Status: Abnormal   Collection Time: 09/23/19  9:49 PM  Result Value Ref [place]   Fibrinogen 581 (H) 210 - 475 mg/dL    Comment: Performed at [facility], [place] [address] [place], [place] [address]  Ferritin     Status: None   Collection Time: 09/23/19  9:49 PM  Result Value Ref [place]   Ferritin 217 24 - 336 ng/mL    Comment: Performed at [facility], [place] [address], [place], [place] [address]  C-reactive protein     Status: Abnormal   Collection Time: 09/23/19  9:49 PM  Result Value Ref [place]   CRP 12.5 (H) <1.0 mg/dL    Comment: Performed at [facility] [place] [address] [place], [place] [address]  D-dimer, quantitative (not at [facility])     Status: Abnormal   Collection  Time: 09/23/19  9:49 PM  Result Value Ref [place]   D-Dimer, Quant >20.00 (H) 0.00 - 0.50 ug/mL-FEU    Comment: (NOTE) At the manufacturer cut-off of 0.50 ug/mL FEU, this assay has been documented to exclude PE with a sensitivity and negative predictive value of 97 to 99%.  At this time, this assay has not been approved by the FDA to exclude DVT/VTE. Results should be correlated with clinical presentation. Performed at [facility], [place] [address], [place], [place] [address]   CBG monitoring, ED     Status: Abnormal   Collection Time: 09/23/19 10:34 PM  Result Value Ref [place]   Glucose-Capillary 122 (H) 70 - 99 mg/dL    Comment: Glucose reference range applies only to samples taken after fasting for at least 8 hours.  Troponin I (High Sensitivity)     Status: Abnormal   Collection Time: 09/24/19 12:47 AM  Result Value Ref [place]   Troponin I (High Sensitivity) 137 (HH) <18 ng/L    Comment: CRITICAL VALUE NOTED.  VALUE IS CONSISTENT WITH PREVIOUSLY REPORTED AND CALLED VALUE. (NOTE) Elevated high sensitivity troponin I (hsTnI) values and significant  changes across serial measurements may suggest ACS but many other  chronic and acute conditions are known to elevate hsTnI results.  Refer to the Links section for chest pain algorithms and additional  guidance. Performed at [facility], [place] [address] [place],  [address]   CBG monitoring,  ED     Status: Abnormal   Collection Time: 09/24/19  4:42 AM  Result Value Ref [place]   Glucose-Capillary 123 (H) 70 - 99 mg/dL    Comment: Glucose reference range applies only to samples taken after fasting for at least 8 hours.  CBC with Differential/Platelet     Status: Abnormal   Collection Time: 09/24/19  5:46 AM  Result Value Ref [place]   WBC 6.0 4.0 - 10.5 K/uL   RBC 3.90 (L) 4.22 - 5.81 MIL/uL   Hemoglobin 12.2 (L) 13.0 - 17.0 g/dL   HCT 37.0 (L) 39.0 - 52.0 %   MCV 94.9 80.0 - 100.0 fL   MCH  31.3 26.0 - 34.0 pg   MCHC 33.0 30.0 - 36.0 g/dL   RDW 14.4 11.5 - 15.5 %   Platelets 342 150 - 400 K/uL   nRBC 1.0 (H) 0.0 - 0.2 %   Neutrophils Relative % 86 %   Neutro Abs 5.2 1.7 - 7.7 K/uL   Lymphocytes Relative 4 %   Lymphs Abs 0.2 (L) 0.7 - 4.0 K/uL   Monocytes Relative 2 %   Monocytes Absolute 0.1 0.1 - 1.0 K/uL   Eosinophils Relative 0 %   Eosinophils Absolute 0.0 0.0 - 0.5 K/uL   Basophils Relative 1 %   Basophils Absolute 0.1 0.0 - 0.1 K/uL   WBC Morphology MILD LEFT SHIFT (1-5% METAS, OCC MYELO, OCC BANDS)    Immature Granulocytes 7 %   Abs Immature Granulocytes 0.40 (H) 0.00 - 0.07 K/uL   Ovalocytes PRESENT     Comment: Performed at [facility], [place] [address] [place], [place] [address]  Comprehensive metabolic panel     Status: Abnormal   Collection Time: 09/24/19  5:46 AM  Result Value Ref [place]   Sodium 145 135 - 145 mmol/L   Potassium 4.1 3.5 - 5.1 mmol/L   Chloride 107 98 - 111 mmol/L   CO2 25 22 - 32 mmol/L   Glucose, Bld 125 (H) 70 - 99 mg/dL    Comment: Glucose reference range applies only to samples taken after fasting for at least 8 hours.   BUN 52 (H) 8 - 23 mg/dL   Creatinine, Ser 1.60 (H) 0.61 - 1.24 mg/dL   Calcium 8.7 (L) 8.9 - 10.3 mg/dL   Total Protein 7.0 6.5 - 8.1 g/dL   Albumin 3.1 (L) 3.5 - 5.0 g/dL   AST 60 (H) 15 - 41 U/L   ALT 18 0 - 44 U/L   Alkaline Phosphatase 67 38 - 126 U/L   Total Bilirubin 0.8 0.3 - 1.2 mg/dL   GFR calc non Af Amer 43 (L) >60 mL/min   GFR calc Af Amer 49 (L) >60 mL/min   Anion gap 13 5 - 15    Comment: Performed at [facility], [place] [address], [place], [place] [address]  C-reactive protein     Status: Abnormal   Collection Time: 09/24/19  5:46 AM  Result Value Ref [place]   CRP 12.2 (H) <1.0 mg/dL    Comment: Performed at [facility], [place] [address] [place],  [address]  D-dimer, quantitative (not at [facility])     Status: Abnormal   Collection  Time: 09/24/19  5:46 AM  Result Value Ref [place]   D-Dimer, Quant >20.00 (H) 0.00 - 0.50 ug/mL-FEU    Comment: CONSISTENT WITH PREVIOUS RESULT (NOTE) At the manufacturer cut-off of 0.50 ug/mL FEU, this assay has been  documented to exclude PE with a sensitivity and negative predictive value of 97 to 99%.  At this time, this assay has not been approved by the FDA to exclude DVT/VTE. Results should be correlated with clinical presentation. Performed at [facility], [place] [address], [place], [place] [address]   Ferritin     Status: None   Collection Time: 09/24/19  5:46 AM  Result Value Ref [place]   Ferritin 209 24 - 336 ng/mL    Comment: Performed at [facility], [place] [address] [place], [place] [address]  Troponin I (High Sensitivity)     Status: Abnormal   Collection Time: 09/24/19  5:46 AM  Result Value Ref [place]   Troponin I (High Sensitivity) 155 (HH) <18 ng/L    Comment: CRITICAL VALUE NOTED.  VALUE IS CONSISTENT WITH PREVIOUSLY REPORTED AND CALLED VALUE. (NOTE) Elevated high sensitivity troponin I (hsTnI) values and significant  changes across serial measurements may suggest ACS but many other  chronic and acute conditions are known to elevate hsTnI results.  Refer to the Links section for chest pain algorithms and additional  guidance. Performed at [facility], [place] [address], [place], [place] [address]   CBG monitoring, ED     Status: Abnormal   Collection Time: 09/24/19  9:43 AM  Result Value Ref [place]   Glucose-Capillary 109 (H) 70 - 99 mg/dL    Comment: Glucose reference range applies only to samples taken after fasting for at least 8 hours.  CBG monitoring, ED     Status: Abnormal   Collection Time: 09/24/19 12:02 PM  Result Value Ref [place]   Glucose-Capillary 147 (H) 70 - 99 mg/dL    Comment: Glucose reference range applies only to samples taken after fasting for at least 8 hours.  CBG monitoring, ED      Status: Abnormal   Collection Time: 09/24/19  5:10 PM  Result Value Ref [place]   Glucose-Capillary 138 (H) 70 - 99 mg/dL    Comment: Glucose reference range applies only to samples taken after fasting for at least 8 hours.   DG Chest Portable 1 View  Result Date: 09/23/2019 CLINICAL DATA:  Acute shortness of breath and hypoxia. EXAM: PORTABLE CHEST 1 VIEW COMPARISON:  12/10/2011 chest radiograph FINDINGS: Bilateral LOWER lung airspace disease/consolidation noted-favor infection. There may be trace bilateral pleural effusions present. No pneumothorax. The cardiomediastinal silhouette is obscured. No acute bony abnormalities are present. IMPRESSION: Bilateral LOWER lung airspace disease/consolidation suspicious for infection. Electronically Signed   By: Margarette Canada M.D.   On: 09/23/2019 15:18    Pending Labs Unresulted Labs (From admission, onward)    Start     Ordered   09/24/19 0500  CBC with Differential/Platelet  Daily,   R     09/23/19 2149   09/24/19 0500  Comprehensive metabolic panel  Daily,   R     09/23/19 2149   09/24/19 0500  C-reactive protein  Daily,   R     09/23/19 2149   09/24/19 0500  D-dimer, quantitative (not at [facility])  Daily,   R     09/23/19 2149   09/24/19 0500  Ferritin  Daily,   R     09/23/19 2149   09/23/19 1449  Lactic acid, plasma  Now then every 2 hours,   STAT     09/23/19 1449          Vitals/Pain Today's Vitals   09/24/19 1730 09/24/19 1800 09/24/19 1830  09/24/19 1900  BP: 128/68 135/72 122/74 115/65  Pulse: 82 82 83 86  Resp: (!) 25 (!) 25 (!) 23 (!) 29  Temp:      TempSrc:      SpO2: 96% 99% 96% 95%  Weight:      Height:      PainSc:        Isolation Precautions Airborne and Contact precautions  Medications Medications  multivitamin with minerals tablet 1 tablet (1 tablet Oral Given 09/24/19 0953)  aspirin EC tablet 81 mg (81 mg Oral Given 09/24/19 0953)  baclofen (LIORESAL) tablet 20 mg (20 mg Oral Given 09/24/19 1713)  cloNIDine  (CATAPRES) tablet 0.1 mg (0.1 mg Oral Given 09/24/19 0953)  finasteride (PROSCAR) tablet 5 mg (5 mg Oral Given 09/24/19 0954)  traZODone (DESYREL) tablet 50 mg (50 mg Oral Given 09/23/19 2252)  pregabalin (LYRICA) capsule 75 mg (75 mg Oral Given 09/24/19 0951)  oxybutynin (DITROPAN-XL) 24 hr tablet 5 mg (5 mg Oral Given 09/23/19 2251)  Ipratropium-Albuterol (COMBIVENT) respimat 1 puff (1 puff Inhalation Given 09/24/19 1712)  dexamethasone (DECADRON) injection 6 mg (6 mg Intravenous Given 09/23/19 2303)  guaiFENesin-dextromethorphan (ROBITUSSIN DM) 100-10 MG/5ML syrup 10 mL (has no administration in time range)  chlorpheniramine-HYDROcodone (TUSSIONEX) 10-8 MG/5ML suspension 5 mL (has no administration in time range)  ascorbic acid (VITAMIN C) tablet 500 mg (500 mg Oral Given 09/24/19 0952)  zinc sulfate capsule 220 mg (220 mg Oral Given 09/24/19 0952)  insulin detemir (LEVEMIR) injection 6 Units (6 Units Subcutaneous Given 09/24/19 0954)  insulin aspart (novoLOG) injection 0-9 Units (1 Units Subcutaneous Given 09/24/19 1714)  linagliptin (TRADJENTA) tablet 5 mg (5 mg Oral Given 09/24/19 0954)  cefTRIAXone (ROCEPHIN) 1 g in sodium chloride 0.9 % 100 mL IVPB (0 g Intravenous Stopped 09/24/19 0138)  azithromycin (ZITHROMAX) 500 mg in sodium chloride 0.9 % 250 mL IVPB (0 mg Intravenous Stopped 09/24/19 0155)  remdesivir 100 mg in sodium chloride 0.9 % 100 mL IVPB (0 mg Intravenous Stopped 09/24/19 1118)  irbesartan (AVAPRO) tablet 150 mg (150 mg Oral Given 09/24/19 0952)  amLODipine (NORVASC) tablet 10 mg (10 mg Oral Given 09/24/19 0953)  hydrochlorothiazide (MICROZIDE) capsule 12.5 mg (12.5 mg Oral Given 09/24/19 0952)  heparin injection 7,500 Units (7,500 Units Subcutaneous Given 09/24/19 1456)  sodium chloride 0.9 % bolus 1,000 mL (0 mLs Intravenous Stopped 09/23/19 2137)  remdesivir 100 mg in sodium chloride 0.9 % 100 mL IVPB (0 mg Intravenous Stopped 09/24/19 0240)  tocilizumab (ACTEMRA) 600 mg in sodium chloride  0.9 % 100 mL infusion (0 mg Intravenous Stopped 09/24/19 0310)    Mobility walks

## 2019-09-24 NOTE — Progress Notes (Signed)
PROGRESS NOTE  Larry [name] [id number] DOB: Mar 16, 1951 DOA: 09/23/2019 PCP: Flossie Buffy, NP  Brief History   Larry [name] is a 68 y.o. male with medical history significant of multiple sclerosis, spinal cord injury currently on wheelchair, diabetes, hyperlipidemia, carpal tunnel syndrome, kidney stones, hypertension who came from the home with shortness of breath, wheezing as well as cough.  Patient was exposed to his niece who was diagnosed with COVID-19 about 2 weeks ago.  He has some low-grade fever but no chills.  Denied any nausea vomiting or abdominal pain.  Patient's oxygen sats were 58% on arrival by EMS.  He was also 78% on arrival in the ER.  He is currently on oxygen by nonrebreather bag with oxygen sat roughly around 90%.  He denied any chest pain.  Denied any other complaints.  COVID-19 screen was positive and chest x-ray showed bilateral pneumonia.  Patient is being admitted to the hospital for treatment..  ED Course: Temperature 98 blood pressure 133/104 pulse 87 respirate of 33 oxygen sat 78% on room air.  ABG showed a pH of 7.453 PCO2 35 and PO2 of 59%.  LDH is 467 troponin I 25.  Ferritin 217 CRP of 12.5 procalcitonin 1.14 D-dimer is more than 20 fibrinogen 581.  COVID-19 screen is positive.  Chest x-ray shows bilateral lower lung airspace disease and consolidation.  CBC and CMP are largely within normal.  Patient is being admitted with acute respiratory failure secondary to COVID-19 pneumonia.  [facility] was consulted to admit the patient for further evaluation and treatment.   Consultants  None  Procedures  . None  Antibiotics   Anti-infectives (From admission, onward)   Start     Dose/Rate Route Frequency Ordered Stop   09/24/19 1000  remdesivir 100 mg in sodium chloride 0.9 % 100 mL IVPB  Status:  Discontinued     100 mg 200 mL/hr over 30 Minutes Intravenous Daily 09/23/19 2149 09/23/19 2200   09/24/19 1000  remdesivir 100 mg in sodium  chloride 0.9 % 100 mL IVPB     100 mg 200 mL/hr over 30 Minutes Intravenous Daily 09/23/19 2021 09/28/19 0959   09/23/19 2200  cefTRIAXone (ROCEPHIN) 1 g in sodium chloride 0.9 % 100 mL IVPB     1 g 200 mL/hr over 30 Minutes Intravenous Every 24 hours 09/23/19 2149     09/23/19 2200  azithromycin (ZITHROMAX) 500 mg in sodium chloride 0.9 % 250 mL IVPB     500 mg 250 mL/hr over 60 Minutes Intravenous Every 24 hours 09/23/19 2149     09/23/19 2148  remdesivir 200 mg in sodium chloride 0.9% 250 mL IVPB  Status:  Discontinued     200 mg 580 mL/hr over 30 Minutes Intravenous Once 09/23/19 2149 09/23/19 2200   09/23/19 2100  remdesivir 100 mg in sodium chloride 0.9 % 100 mL IVPB     100 mg 200 mL/hr over 30 Minutes Intravenous Every 30 min 09/23/19 2021 09/24/19 0240    .  Subjective  The patient is resting comfortably. No new complaints.  Objective   Vitals:  Vitals:   09/24/19 1830 09/24/19 1900  BP: 122/74 115/65  Pulse: 83 86  Resp: (!) 23 (!) 29  Temp:    SpO2: 96% 95%   Exam:  Constitutional:  . The patient is awake, alert, and oriented x 3. No acute distress. Respiratory:  . Positive for increased work of breathing. . No wheezes, rales, or rhonchi . No tactile fremitus .  Poor air entry Cardiovascular:  . Regular rate and rhythm . No murmurs, ectopy, or gallups. . No lateral PMI. No thrills. Abdomen:  . Abdomen is soft, non-tender, non-distended . No hernias, masses, or organomegaly . Normoactive bowel sounds.  Musculoskeletal:  . No cyanosis, clubbing, or edema Skin:  . No rashes, lesions, ulcers . palpation of skin: no induration or nodules Neurologic:  . CN 2-12 intact . Sensation all 4 extremities intact Psychiatric:  . Mental status o Mood, affect appropriate o Orientation to person, place, time  . judgment and insight appear intact  I have personally reviewed the following:   Today's Data  . Vitals, CMP, CBC, CRP, Elevated troponin  Imaging   . CXR  Cardiology Data  . EKG  Scheduled Meds: . amLODipine  10 mg Oral Daily  . vitamin C  500 mg Oral Daily  . aspirin EC  81 mg Oral Daily  . baclofen  20 mg Oral TID  . cloNIDine  0.1 mg Oral BID  . dexamethasone (DECADRON) injection  6 mg Intravenous Q24H  . finasteride  5 mg Oral Daily  . heparin injection (subcutaneous)  7,500 Units Subcutaneous Q8H  . hydrochlorothiazide  12.5 mg Oral Daily  . insulin aspart  0-9 Units Subcutaneous Q4H  . insulin detemir  0.075 Units/kg Subcutaneous BID  . Ipratropium-Albuterol  1 puff Inhalation Q6H  . irbesartan  150 mg Oral Daily  . linagliptin  5 mg Oral Daily  . multivitamin with minerals  1 tablet Oral Daily  . oxybutynin  5 mg Oral QHS  . pregabalin  75 mg Oral BID  . traZODone  50 mg Oral QHS  . zinc sulfate  220 mg Oral Daily   Continuous Infusions: . azithromycin Stopped (09/24/19 0155)  . cefTRIAXone (ROCEPHIN)  IV Stopped (09/24/19 0138)  . remdesivir 100 mg in NS 100 mL Stopped (09/24/19 1118)    Principal Problem:   Acute respiratory failure due to COVID-19 [facility]) Active Problems:   Paraplegia (HCC)   DM (diabetes mellitus) ([place])   MS (multiple sclerosis) ([place])   Hypertension   Overactive bladder   History of spinal cord injury   GERD (gastroesophageal reflux disease)   Benign prostatic hyperplasia with lower urinary tract symptoms   LOS: 1 day   A & P   Acute respiratory failure secondary to COVID-19 pneumonia: Patient will be admitted to stepdown unit.  He is currently on nonrebreather back.  May need to get on BiPAP or higher flow oxygen.  Initiate remdesivir, add Actemra, Decadron, antibiotics and supportive care.  Follow daily markers.  DM II:  Continue blood sugar control with sliding scale.  Multiple sclerosis: Continue home regimen.  Paraplegia: Secondary to T10 compression fracture.  Has been stable with overactive bladder.  Continue monitoring.  Hypertension: Continue amlodipine,  hydrochlorothiazide, Avapro and clonidine.  GERD: Continue with PPIs  Elevated troponin: Cycle enzymes.  Most likely secondary to Covid 19 infection.  May get echocardiogram if cardiac decompensation. Repeat EKG does not demonstrate significant change.  I have seen and examined this patient myself. I have spent 35 minutes in his evaluation and care.  DVT prophylaxis: Lovenox Code Status: Full code Family Communication: None present Disposition Plan: Patient is from home. Disposition at discharge is pending PT/OT eval once respiratory issues are improved. Barriers to discharge include acute hypoxic respiratory failure complicated by paraplegia.  Vence Lalor, DO [facility] Direct contact: see [email or web address]  7PM-7AM contact night coverage as above 09/24/2019, 7:52 PM  LOS: 1 day          

## 2019-09-25 ENCOUNTER — Encounter (HOSPITAL_COMMUNITY): Payer: Self-pay | Admitting: Internal Medicine

## 2019-09-25 ENCOUNTER — Inpatient Hospital Stay (HOSPITAL_COMMUNITY): Payer: Medicare Other

## 2019-09-25 DIAGNOSIS — U071 COVID-19: Secondary | ICD-10-CM | POA: Diagnosis not present

## 2019-09-25 DIAGNOSIS — L8990 Pressure ulcer of unspecified site, unspecified stage: Secondary | ICD-10-CM | POA: Insufficient documentation

## 2019-09-25 DIAGNOSIS — J9600 Acute respiratory failure, unspecified whether with hypoxia or hypercapnia: Secondary | ICD-10-CM | POA: Diagnosis not present

## 2019-09-25 LAB — CBC WITH DIFFERENTIAL/PLATELET
Abs Immature Granulocytes: 0.63 10*3/uL — ABNORMAL HIGH (ref 0.00–0.07)
Basophils Absolute: 0 10*3/uL (ref 0.0–0.1)
Basophils Relative: 0 %
Eosinophils Absolute: 0 10*3/uL (ref 0.0–0.5)
Eosinophils Relative: 0 %
HCT: 35.6 % — ABNORMAL LOW (ref 39.0–52.0)
Hemoglobin: 11.4 g/dL — ABNORMAL LOW (ref 13.0–17.0)
Immature Granulocytes: 11 %
Lymphocytes Relative: 5 %
Lymphs Abs: 0.3 10*3/uL — ABNORMAL LOW (ref 0.7–4.0)
MCH: 30.9 pg (ref 26.0–34.0)
MCHC: 32 g/dL (ref 30.0–36.0)
MCV: 96.5 fL (ref 80.0–100.0)
Monocytes Absolute: 0.2 10*3/uL (ref 0.1–1.0)
Monocytes Relative: 3 %
Neutro Abs: 4.8 10*3/uL (ref 1.7–7.7)
Neutrophils Relative %: 81 %
Platelets: 292 10*3/uL (ref 150–400)
RBC: 3.69 MIL/uL — ABNORMAL LOW (ref 4.22–5.81)
RDW: 14.2 % (ref 11.5–15.5)
WBC: 5.9 10*3/uL (ref 4.0–10.5)
nRBC: 2 % — ABNORMAL HIGH (ref 0.0–0.2)

## 2019-09-25 LAB — COMPREHENSIVE METABOLIC PANEL
ALT: 21 U/L (ref 0–44)
AST: 54 U/L — ABNORMAL HIGH (ref 15–41)
Albumin: 2.8 g/dL — ABNORMAL LOW (ref 3.5–5.0)
Alkaline Phosphatase: 75 U/L (ref 38–126)
Anion gap: 13 (ref 5–15)
BUN: 46 mg/dL — ABNORMAL HIGH (ref 8–23)
CO2: 23 mmol/L (ref 22–32)
Calcium: 8.7 mg/dL — ABNORMAL LOW (ref 8.9–10.3)
Chloride: 106 mmol/L (ref 98–111)
Creatinine, Ser: 1.13 mg/dL (ref 0.61–1.24)
GFR calc Af Amer: >60 mL/min (ref >60)
GFR calc non Af Amer: >60 mL/min (ref >60)
Glucose, Bld: 148 mg/dL — ABNORMAL HIGH (ref 70–99)
Potassium: 4.2 mmol/L (ref 3.5–5.1)
Sodium: 142 mmol/L (ref 135–145)
Total Bilirubin: 0.6 mg/dL (ref 0.3–1.2)
Total Protein: 6.1 g/dL — ABNORMAL LOW (ref 6.5–8.1)

## 2019-09-25 LAB — GLUCOSE, CAPILLARY
Glucose-Capillary: 138 mg/dL — ABNORMAL HIGH (ref 70–99)
Glucose-Capillary: 144 mg/dL — ABNORMAL HIGH (ref 70–99)
Glucose-Capillary: 155 mg/dL — ABNORMAL HIGH (ref 70–99)
Glucose-Capillary: 173 mg/dL — ABNORMAL HIGH (ref 70–99)
Glucose-Capillary: 174 mg/dL — ABNORMAL HIGH (ref 70–99)
Glucose-Capillary: 178 mg/dL — ABNORMAL HIGH (ref 70–99)

## 2019-09-25 LAB — D-DIMER, QUANTITATIVE: D-Dimer, Quant: >20 ug/mL-FEU — ABNORMAL HIGH (ref 0.00–0.50)

## 2019-09-25 LAB — C-REACTIVE PROTEIN: CRP: 6.8 mg/dL — ABNORMAL HIGH (ref <1.0)

## 2019-09-25 LAB — FERRITIN: Ferritin: 179 ng/mL (ref 24–336)

## 2019-09-25 MED ORDER — IOHEXOL 350 MG/ML SOLN
80.0000 mL | Freq: Once | INTRAVENOUS | Status: AC | PRN
  Administered 2019-09-25: 80 mL via INTRAVENOUS

## 2019-09-25 MED ORDER — ENOXAPARIN SODIUM 80 MG/0.8ML ~~LOC~~ SOLN
1.0000 mg/kg | Freq: Two times a day (BID) | SUBCUTANEOUS | Status: DC
  Administered 2019-09-25 – 2019-09-29 (×9): 70 mg via SUBCUTANEOUS
  Filled 2019-09-25 (×11): qty 0.7

## 2019-09-25 MED ORDER — ENOXAPARIN SODIUM 40 MG/0.4ML ~~LOC~~ SOLN: 40.0000 mg | Freq: Two times a day (BID) | SUBCUTANEOUS | Status: DC

## 2019-09-25 MED ORDER — SODIUM CHLORIDE (PF) 0.9 % IJ SOLN
INTRAMUSCULAR | Status: AC
  Filled 2019-09-25: qty 50

## 2019-09-25 NOTE — Plan of Care (Signed)
Continue to follow MD ordered to treat and take care of patient

## 2019-09-25 NOTE — Progress Notes (Signed)
PROGRESS NOTE  Larry [name] [id number] DOB: 07-31-1950 DOA: 09/23/2019 PCP: Flossie Buffy, NP  Brief History   Larry [name] is a 69 y.o. male with medical history significant of multiple sclerosis, spinal cord injury currently on wheelchair, diabetes, hyperlipidemia, carpal tunnel syndrome, kidney stones, hypertension who came from the home with shortness of breath, wheezing as well as cough.  Patient was exposed to his niece who was diagnosed with COVID-19 about 2 weeks ago.  He has some low-grade fever but no chills.  Denied any nausea vomiting or abdominal pain.  Patient's oxygen sats were 58% on arrival by EMS.  He was also 78% on arrival in the ER.  He is currently on oxygen by nonrebreather bag with oxygen sat roughly around 90%.  He denied any chest pain.  Denied any other complaints.  COVID-19 screen was positive and chest x-ray showed bilateral pneumonia.  Patient is being admitted to the hospital for treatment..  ED Course: Temperature 98 blood pressure 133/104 pulse 87 respirate of 33 oxygen sat 78% on room air.  ABG showed a pH of 7.453 PCO2 35 and PO2 of 59%.  LDH is 467 troponin I 25.  Ferritin 217 CRP of 12.5 procalcitonin 1.14 D-dimer is more than 20 fibrinogen 581.  COVID-19 screen is positive.  Chest x-ray shows bilateral lower lung airspace disease and consolidation.  CBC and CMP are largely within normal.  Patient is being admitted with acute respiratory failure secondary to COVID-19 pneumonia.  [facility] was consulted to admit the patient for further evaluation and treatment.   Consultants  None  Procedures  . None  Antibiotics   Anti-infectives (From admission, onward)   Start     Dose/Rate Route Frequency Ordered Stop   09/24/19 1000  remdesivir 100 mg in sodium chloride 0.9 % 100 mL IVPB  Status:  Discontinued     100 mg 200 mL/hr over 30 Minutes Intravenous Daily 09/23/19 2149 09/23/19 2200   09/24/19 1000  remdesivir 100 mg in sodium  chloride 0.9 % 100 mL IVPB     100 mg 200 mL/hr over 30 Minutes Intravenous Daily 09/23/19 2021 09/28/19 0959   09/23/19 2200  cefTRIAXone (ROCEPHIN) 1 g in sodium chloride 0.9 % 100 mL IVPB     1 g 200 mL/hr over 30 Minutes Intravenous Every 24 hours 09/23/19 2149     09/23/19 2200  azithromycin (ZITHROMAX) 500 mg in sodium chloride 0.9 % 250 mL IVPB     500 mg 250 mL/hr over 60 Minutes Intravenous Every 24 hours 09/23/19 2149     09/23/19 2148  remdesivir 200 mg in sodium chloride 0.9% 250 mL IVPB  Status:  Discontinued     200 mg 580 mL/hr over 30 Minutes Intravenous Once 09/23/19 2149 09/23/19 2200   09/23/19 2100  remdesivir 100 mg in sodium chloride 0.9 % 100 mL IVPB     100 mg 200 mL/hr over 30 Minutes Intravenous Every 30 min 09/23/19 2021 09/24/19 0240     Subjective  The patient is resting comfortably. No new complaints.  Objective   Vitals:  Vitals:   09/25/19 0700 09/25/19 0800  BP: 114/74 (!) 149/82  Pulse: 64 70  Resp: 14 15  Temp:  98.1 F (36.7 C)  SpO2: 96% 94%   Exam:  Constitutional:  . The patient is awake, alert, and oriented x 3. No acute distress. Respiratory:  . Positive for increased work of breathing. . No wheezes, rales, or rhonchi . No tactile  fremitus . Poor air entry Cardiovascular:  . Regular rate and rhythm . No murmurs, ectopy, or gallups. . No lateral PMI. No thrills. Abdomen:  . Abdomen is soft, non-tender, non-distended . No hernias, masses, or organomegaly . Normoactive bowel sounds.  Musculoskeletal:  . No cyanosis, clubbing, or edema Skin:  . No rashes, lesions, ulcers . palpation of skin: no induration or nodules Neurologic:  . CN 2-12 intact . Sensation all 4 extremities intact Psychiatric:  . Mental status o Mood, affect appropriate o Orientation to person, place, time  . judgment and insight appear intact  I have personally reviewed the following:   Today's Data  . Vitals, CMP, CBC, CRP, Elevated  troponin  Imaging  . CXR  Cardiology Data  . EKG  Scheduled Meds: . amLODipine  10 mg Oral Daily  . vitamin C  500 mg Oral Daily  . aspirin EC  81 mg Oral Daily  . baclofen  20 mg Oral TID  . Chlorhexidine Gluconate Cloth  6 each Topical Daily  . cloNIDine  0.1 mg Oral BID  . dexamethasone (DECADRON) injection  6 mg Intravenous Q24H  . finasteride  5 mg Oral Daily  . heparin injection (subcutaneous)  7,500 Units Subcutaneous Q8H  . hydrochlorothiazide  12.5 mg Oral Daily  . insulin aspart  0-9 Units Subcutaneous Q4H  . insulin detemir  0.075 Units/kg Subcutaneous BID  . Ipratropium-Albuterol  1 puff Inhalation Q6H  . irbesartan  150 mg Oral Daily  . linagliptin  5 mg Oral Daily  . mouth rinse  15 mL Mouth Rinse BID  . multivitamin with minerals  1 tablet Oral Daily  . oxybutynin  5 mg Oral QHS  . pregabalin  75 mg Oral BID  . traZODone  50 mg Oral QHS  . zinc sulfate  220 mg Oral Daily   Continuous Infusions: . azithromycin Stopped (09/24/19 2316)  . cefTRIAXone (ROCEPHIN)  IV Stopped (09/24/19 2227)  . remdesivir 100 mg in NS 100 mL Stopped (09/25/19 [id number])    Principal Problem:   Acute respiratory failure due to COVID-19 [facility]) Active Problems:   Paraplegia (HCC)   DM (diabetes mellitus) ([place])   MS (multiple sclerosis) ([place])   Hypertension   Overactive bladder   History of spinal cord injury   GERD (gastroesophageal reflux disease)   Benign prostatic hyperplasia with lower urinary tract symptoms   Pressure injury of skin   LOS: 2 days   A & P   Acute respiratory failure secondary to COVID-19 pneumonia:  He is currently saturating in the low nineties on 6L O2 by [place]. He was requiring high flow O2 by [place] at 10L overnight. He is continued on remdesivir, add Actemra, Decadron, antibiotics and supportive care.  Inflammatory markers are trending down.  DM II:  Continue blood sugar control with sliding scale.  Multiple sclerosis: Continue home  regimen.  Paraplegia: Secondary to T10 compression fracture.  Has been stable with overactive bladder.  Continue monitoring.  Hypertension: Continue amlodipine, hydrochlorothiazide, Avapro and clonidine.  GERD: Continue with PPIs  Elevated troponin: Cycle enzymes.  Most likely secondary to Covid 19 infection.  May get echocardiogram if cardiac decompensation. Repeat EKG does not demonstrate significant change.  Stage II medial sacral pressure ulcer: Present on Admission. Wound care consult.  I have seen and examined this patient myself. I have spent 32 minutes in his evaluation and care.  DVT prophylaxis: Lovenox Code Status: Full code Family Communication: None present Disposition Plan: Patient is from  home. Disposition at discharge is pending PT/OT eval once respiratory issues are improved. Barriers to discharge include acute hypoxic respiratory failure complicated by paraplegia.  Zyair Rhein, DO [facility] Direct contact: see [email or web address]  7PM-7AM contact night coverage as above 09/25/2019, 11:50 AM  LOS: 1 day

## 2019-09-25 NOTE — Progress Notes (Signed)
Brief note from nocturnal cross cover Radiologist reports  Small bilateral pulmonary embolism IMPRESSION: 1. Findings suspicious for pulmonary emboli involving the segmental and subsegmental branches of the left lower lobe, right middle lobe and lingula. Breathing motion artifact limits detailed assessment, but thromboembolic burden appears small. No evidence of right heart strain. 2. Patchy ground-glass opacities throughout both lungs mid lower lung zone predominant, consistent with COVID-19 pneumonia. 3. Small left pleural effusion. 4. Prominent subcarinal and right hilar lymph nodes are likely reactive.  Results discussed with patient and treatment dose lovenox initiated

## 2019-09-25 NOTE — Progress Notes (Signed)
Daughter updated via telephone per patient request.

## 2019-09-25 NOTE — Progress Notes (Signed)
ANTICOAGULATION CONSULT NOTE - Initial Consult  Pharmacy Consult for Enoxaparin Indication: pulmonary embolus  Allergies  Allergen Reactions  . No Known Allergies     Patient Measurements: Height: 5\' 7"  (170.2 cm) Weight: 69.8 kg (153 lb 14.1 oz) IBW/kg (Calculated) : 66.1  Vital Signs: Temp: 98.8 F (37.1 C) (04/26 1600) Temp Source: Oral (04/26 1600) BP: 141/82 (04/26 2000) Pulse Rate: 109 (04/26 2000)  Labs: Recent Labs    09/23/19 1449 09/23/19 1449 09/23/19 2149 09/24/19 0047 09/24/19 0546 09/25/19 0234  HGB 11.6*   < >  --   --  12.2* 11.4*  HCT 34.6*  --   --   --  37.0* 35.6*  PLT 361  --   --   --  342 292  CREATININE 2.38*  --   --   --  1.60* 1.13  TROPONINIHS  --   --  125* 137* 155*  --    < > = values in this interval not displayed.    Estimated Creatinine Clearance: 56.1 mL/min (by C-G formula based on SCr of 1.13 mg/dL).   Medical History: Past Medical History:  Diagnosis Date  . Abnormality of gait   . Acute delirium 12/11/2011  . CTS (carpal tunnel syndrome) 09/26/2015  . Diabetes mellitus   . Hypercholesterolemia   . Hypertension   . Kidney stones   . Leukocytopenia 05/22/2013  . MS (multiple sclerosis) [facility])    sees [facility]  . T10 spinal cord injury ([place]) 12/12/2011   hx surgery 1996 with bilat gait abnormality since    Assessment: 33 y/oM admitted with COVID-19 pneumonia. D-dimer > 20. CTa chest today: Findings suspicious for pulmonary emboli involving the segmental and subsegmental branches of the left lower lobe, right middle lobe and lingula. No evidence of right heart strain. Pharmacy consulted for Enoxaparin dosing. Last dose of SQ heparin 7500 units today at 1430. SCr 1.13 with CrCl ~ 56 ml/min. CBC: Hgb 11.4, Pltc WNL.   Goal of Therapy:  Anti-Xa level 0.6-1 units/ml 4hrs after LMWH dose given Monitor platelets by anticoagulation protocol: Yes   Plan:  Enoxaparin 1mg /kg (70mg ) SQ q12h Monitor renal function, CBC,  and for s/sx of bleeding   Lindell Spar, PharmD, BCPS Clinical Pharmacist 09/25/2019,9:05 PM

## 2019-09-26 DIAGNOSIS — U071 COVID-19: Secondary | ICD-10-CM | POA: Diagnosis not present

## 2019-09-26 DIAGNOSIS — J9600 Acute respiratory failure, unspecified whether with hypoxia or hypercapnia: Secondary | ICD-10-CM | POA: Diagnosis not present

## 2019-09-26 LAB — COMPREHENSIVE METABOLIC PANEL
ALT: 20 U/L (ref 0–44)
AST: 35 U/L (ref 15–41)
Albumin: 2.8 g/dL — ABNORMAL LOW (ref 3.5–5.0)
Alkaline Phosphatase: 88 U/L (ref 38–126)
Anion gap: 10 (ref 5–15)
BUN: 38 mg/dL — ABNORMAL HIGH (ref 8–23)
CO2: 24 mmol/L (ref 22–32)
Calcium: 8.6 mg/dL — ABNORMAL LOW (ref 8.9–10.3)
Chloride: 106 mmol/L (ref 98–111)
Creatinine, Ser: 0.94 mg/dL (ref 0.61–1.24)
GFR calc Af Amer: >60 mL/min (ref >60)
GFR calc non Af Amer: >60 mL/min (ref >60)
Glucose, Bld: 163 mg/dL — ABNORMAL HIGH (ref 70–99)
Potassium: 3.9 mmol/L (ref 3.5–5.1)
Sodium: 140 mmol/L (ref 135–145)
Total Bilirubin: 0.5 mg/dL (ref 0.3–1.2)
Total Protein: 6 g/dL — ABNORMAL LOW (ref 6.5–8.1)

## 2019-09-26 LAB — FERRITIN: Ferritin: 148 ng/mL (ref 24–336)

## 2019-09-26 LAB — CBC WITH DIFFERENTIAL/PLATELET
Abs Immature Granulocytes: 1 10*3/uL — ABNORMAL HIGH (ref 0.00–0.07)
Basophils Absolute: 0.1 10*3/uL (ref 0.0–0.1)
Basophils Relative: 1 %
Eosinophils Absolute: 0 10*3/uL (ref 0.0–0.5)
Eosinophils Relative: 0 %
HCT: 36.4 % — ABNORMAL LOW (ref 39.0–52.0)
Hemoglobin: 11.8 g/dL — ABNORMAL LOW (ref 13.0–17.0)
Immature Granulocytes: 12 %
Lymphocytes Relative: 5 %
Lymphs Abs: 0.4 10*3/uL — ABNORMAL LOW (ref 0.7–4.0)
MCH: 31.1 pg (ref 26.0–34.0)
MCHC: 32.4 g/dL (ref 30.0–36.0)
MCV: 96 fL (ref 80.0–100.0)
Monocytes Absolute: 0.2 10*3/uL (ref 0.1–1.0)
Monocytes Relative: 2 %
Neutro Abs: 6.6 10*3/uL (ref 1.7–7.7)
Neutrophils Relative %: 80 %
Platelets: 376 10*3/uL (ref 150–400)
RBC: 3.79 MIL/uL — ABNORMAL LOW (ref 4.22–5.81)
RDW: 14.4 % (ref 11.5–15.5)
WBC: 8.2 10*3/uL (ref 4.0–10.5)
nRBC: 2.2 % — ABNORMAL HIGH (ref 0.0–0.2)

## 2019-09-26 LAB — GLUCOSE, CAPILLARY
Glucose-Capillary: 141 mg/dL — ABNORMAL HIGH (ref 70–99)
Glucose-Capillary: 99 mg/dL (ref 70–99)
Glucose-Capillary: 161 mg/dL — ABNORMAL HIGH (ref 70–99)
Glucose-Capillary: 133 mg/dL — ABNORMAL HIGH (ref 70–99)
Glucose-Capillary: 91 mg/dL (ref 70–99)
Glucose-Capillary: 119 mg/dL — ABNORMAL HIGH (ref 70–99)

## 2019-09-26 LAB — D-DIMER, QUANTITATIVE: D-Dimer, Quant: >20 ug/mL-FEU — ABNORMAL HIGH (ref 0.00–0.50)

## 2019-09-26 LAB — C-REACTIVE PROTEIN: CRP: 3.2 mg/dL — ABNORMAL HIGH (ref <1.0)

## 2019-09-26 NOTE — Progress Notes (Signed)
PROGRESS NOTE  Larry [name] [id number] DOB: 11/21/1950 DOA: 09/23/2019 PCP: Flossie Buffy, NP  Brief History   Larry [name] is a 69 y.o. male with medical history significant of multiple sclerosis, spinal cord injury currently on wheelchair, diabetes, hyperlipidemia, carpal tunnel syndrome, kidney stones, hypertension who came from the home with shortness of breath, wheezing as well as cough.  Patient was exposed to his niece who was diagnosed with COVID-19 about 2 weeks ago.  He has some low-grade fever but no chills.  Denied any nausea vomiting or abdominal pain.  Patient's oxygen sats were 58% on arrival by EMS.  He was also 78% on arrival in the ER.  He is currently on oxygen by nonrebreather bag with oxygen sat roughly around 90%.  He denied any chest pain.  Denied any other complaints.  COVID-19 screen was positive and chest x-ray showed bilateral pneumonia.  Patient is being admitted to the hospital for treatment..  ED Course: Temperature 98 blood pressure 133/104 pulse 87 respirate of 33 oxygen sat 78% on room air.  ABG showed a pH of 7.453 PCO2 35 and PO2 of 59%.  LDH is 467 troponin I 25.  Ferritin 217 CRP of 12.5 procalcitonin 1.14 D-dimer is more than 20 fibrinogen 581.  COVID-19 screen is positive.  Chest x-ray shows bilateral lower lung airspace disease and consolidation.  CBC and CMP are largely within normal.  Patient is being admitted with acute respiratory failure secondary to COVID-19 pneumonia.  [facility] was consulted to admit the patient for further evaluation and treatment.   Consultants  None  Procedures  . None  Antibiotics   Anti-infectives (From admission, onward)   Start     Dose/Rate Route Frequency Ordered Stop   09/24/19 1000  remdesivir 100 mg in sodium chloride 0.9 % 100 mL IVPB  Status:  Discontinued     100 mg 200 mL/hr over 30 Minutes Intravenous Daily 09/23/19 2149 09/23/19 2200   09/24/19 1000  remdesivir 100 mg in sodium  chloride 0.9 % 100 mL IVPB     100 mg 200 mL/hr over 30 Minutes Intravenous Daily 09/23/19 2021 09/28/19 0959   09/23/19 2200  cefTRIAXone (ROCEPHIN) 1 g in sodium chloride 0.9 % 100 mL IVPB     1 g 200 mL/hr over 30 Minutes Intravenous Every 24 hours 09/23/19 2149     09/23/19 2200  azithromycin (ZITHROMAX) 500 mg in sodium chloride 0.9 % 250 mL IVPB     500 mg 250 mL/hr over 60 Minutes Intravenous Every 24 hours 09/23/19 2149     09/23/19 2148  remdesivir 200 mg in sodium chloride 0.9% 250 mL IVPB  Status:  Discontinued     200 mg 580 mL/hr over 30 Minutes Intravenous Once 09/23/19 2149 09/23/19 2200   09/23/19 2100  remdesivir 100 mg in sodium chloride 0.9 % 100 mL IVPB     100 mg 200 mL/hr over 30 Minutes Intravenous Every 30 min 09/23/19 2021 09/24/19 0240     Subjective  The patient is resting comfortably. No new complaints.  Objective   Vitals:  Vitals:   09/26/19 1200 09/26/19 1300  BP: (!) 143/70 (!) 141/77  Pulse: 84 81  Resp: (!) 23 18  Temp:    SpO2: 92% 97%   Exam:  Constitutional:  . The patient is awake, alert, and oriented x 3. No acute distress. Respiratory:  . Positive for increased work of breathing. . No wheezes, rales, or rhonchi . No tactile fremitus .  Poor air entry Cardiovascular:  . Regular rate and rhythm . No murmurs, ectopy, or gallups. . No lateral PMI. No thrills. Abdomen:  . Abdomen is soft, non-tender, non-distended . No hernias, masses, or organomegaly . Normoactive bowel sounds.  Musculoskeletal:  . No cyanosis, clubbing, or edema Skin:  . No rashes, lesions, ulcers . palpation of skin: no induration or nodules Neurologic:  . CN 2-12 intact . Sensation all 4 extremities intact Psychiatric:  . Mental status o Mood, affect appropriate o Orientation to person, place, time  . judgment and insight appear intact  I have personally reviewed the following:   Today's Data  . Vitals, CMP, CBC, CRP, Elevated troponin  Imaging    . CXR . CTA chest  Cardiology Data  . EKG  Scheduled Meds: . amLODipine  10 mg Oral Daily  . vitamin C  500 mg Oral Daily  . aspirin EC  81 mg Oral Daily  . baclofen  20 mg Oral TID  . Chlorhexidine Gluconate Cloth  6 each Topical Daily  . cloNIDine  0.1 mg Oral BID  . dexamethasone (DECADRON) injection  6 mg Intravenous Q24H  . enoxaparin (LOVENOX) injection  1 mg/kg Subcutaneous Q12H  . finasteride  5 mg Oral Daily  . hydrochlorothiazide  12.5 mg Oral Daily  . insulin aspart  0-9 Units Subcutaneous Q4H  . insulin detemir  0.075 Units/kg Subcutaneous BID  . Ipratropium-Albuterol  1 puff Inhalation Q6H  . irbesartan  150 mg Oral Daily  . linagliptin  5 mg Oral Daily  . mouth rinse  15 mL Mouth Rinse BID  . multivitamin with minerals  1 tablet Oral Daily  . oxybutynin  5 mg Oral QHS  . pregabalin  75 mg Oral BID  . traZODone  50 mg Oral QHS  . zinc sulfate  220 mg Oral Daily   Continuous Infusions: . azithromycin Stopped (09/25/19 2355)  . cefTRIAXone (ROCEPHIN)  IV Stopped (09/25/19 2220)  . remdesivir 100 mg in NS 100 mL Stopped (09/26/19 1026)    Principal Problem:   Acute respiratory failure due to COVID-19 [facility]) Active Problems:   Paraplegia (HCC)   DM (diabetes mellitus) ([place])   MS (multiple sclerosis) ([place])   Hypertension   Overactive bladder   History of spinal cord injury   GERD (gastroesophageal reflux disease)   Benign prostatic hyperplasia with lower urinary tract symptoms   Pressure injury of skin   LOS: 3 days   A & P   Acute hypoxic respiratory failure:  He is currently saturating in the mid nineties on 5L O2 by [facility].   COVID-19 pneumonia: Again demonstrated on CTA chest performed on 09/25/2019 as patchy ground-glass opacities thoughout both lungs mid lower lung zone predominant. The patient is continued on remdesivir, add Actemra, Decadron, antibiotics and supportive care.  Inflammatory markers are stabilizing.  Multilobar pulmonary emboli: CTA  chest demonstrated findings suspicious for pulmonary emboli involving the segmental and subsegmental branches of the left lower lobe, right middle lobe and lingular lobe. There is no evidence of right heart strain and the thromboembolic burden is reported as small. The patient has been started on Lovenox which is being managed by pharmacy.   DM II:  Continue blood sugar control with sliding scale.  Multiple sclerosis: Continue home regimen.  Paraplegia: Secondary to T10 compression fracture.  Has been stable with overactive bladder.  Continue monitoring.  Hypertension: Continue amlodipine, hydrochlorothiazide, Avapro and clonidine.  GERD: Continue with PPIs  Elevated troponin: Cycle enzymes.  Most likely secondary to Covid 19 infection.  May get echocardiogram if cardiac decompensation. Repeat EKG does not demonstrate significant change.  Stage II medial sacral pressure ulcer: Present on Admission. Wound care consult.  I have seen and examined this patient myself. I have spent 34 minutes in his evaluation and care.  DVT prophylaxis: Lovenox Code Status: Full code Family Communication: None present Disposition Plan: Patient is from home. Disposition at discharge is pending PT/OT eval once respiratory issues are improved. Barriers to discharge include acute hypoxic respiratory failure due to PE and COVID-19 pneumonia as complicated by paraplegia.  Edom Schmuhl, DO [facility] Direct contact: see [email or web address]  7PM-7AM contact night coverage as above 09/26/2019, 2:50 PM  LOS: 1 day

## 2019-09-27 DIAGNOSIS — J9600 Acute respiratory failure, unspecified whether with hypoxia or hypercapnia: Secondary | ICD-10-CM | POA: Diagnosis not present

## 2019-09-27 DIAGNOSIS — U071 COVID-19: Secondary | ICD-10-CM | POA: Diagnosis not present

## 2019-09-27 LAB — CBC WITH DIFFERENTIAL/PLATELET
Abs Immature Granulocytes: 1.29 10*3/uL — ABNORMAL HIGH (ref 0.00–0.07)
Basophils Absolute: 0 10*3/uL (ref 0.0–0.1)
Basophils Relative: 0 %
Eosinophils Absolute: 0 10*3/uL (ref 0.0–0.5)
Eosinophils Relative: 0 %
HCT: 37.2 % — ABNORMAL LOW (ref 39.0–52.0)
Hemoglobin: 11.9 g/dL — ABNORMAL LOW (ref 13.0–17.0)
Immature Granulocytes: 15 %
Lymphocytes Relative: 3 %
Lymphs Abs: 0.3 10*3/uL — ABNORMAL LOW (ref 0.7–4.0)
MCH: 31.2 pg (ref 26.0–34.0)
MCHC: 32 g/dL (ref 30.0–36.0)
MCV: 97.6 fL (ref 80.0–100.0)
Monocytes Absolute: 0.1 10*3/uL (ref 0.1–1.0)
Monocytes Relative: 1 %
Neutro Abs: 6.7 10*3/uL (ref 1.7–7.7)
Neutrophils Relative %: 81 %
Platelets: 384 10*3/uL (ref 150–400)
RBC: 3.81 MIL/uL — ABNORMAL LOW (ref 4.22–5.81)
RDW: 14.4 % (ref 11.5–15.5)
WBC: 8.4 10*3/uL (ref 4.0–10.5)
nRBC: 3 % — ABNORMAL HIGH (ref 0.0–0.2)

## 2019-09-27 LAB — FERRITIN: Ferritin: 133 ng/mL (ref 24–336)

## 2019-09-27 LAB — COMPREHENSIVE METABOLIC PANEL
ALT: 23 U/L (ref 0–44)
AST: 44 U/L — ABNORMAL HIGH (ref 15–41)
Albumin: 2.7 g/dL — ABNORMAL LOW (ref 3.5–5.0)
Alkaline Phosphatase: 76 U/L (ref 38–126)
Anion gap: 11 (ref 5–15)
BUN: 37 mg/dL — ABNORMAL HIGH (ref 8–23)
CO2: 22 mmol/L (ref 22–32)
Calcium: 8.5 mg/dL — ABNORMAL LOW (ref 8.9–10.3)
Chloride: 107 mmol/L (ref 98–111)
Creatinine, Ser: 0.87 mg/dL (ref 0.61–1.24)
GFR calc Af Amer: >60 mL/min (ref >60)
GFR calc non Af Amer: >60 mL/min (ref >60)
Glucose, Bld: 150 mg/dL — ABNORMAL HIGH (ref 70–99)
Potassium: 4.3 mmol/L (ref 3.5–5.1)
Sodium: 140 mmol/L (ref 135–145)
Total Bilirubin: 0.8 mg/dL (ref 0.3–1.2)
Total Protein: 5.7 g/dL — ABNORMAL LOW (ref 6.5–8.1)

## 2019-09-27 LAB — GLUCOSE, CAPILLARY
Glucose-Capillary: 149 mg/dL — ABNORMAL HIGH (ref 70–99)
Glucose-Capillary: 118 mg/dL — ABNORMAL HIGH (ref 70–99)
Glucose-Capillary: 163 mg/dL — ABNORMAL HIGH (ref 70–99)
Glucose-Capillary: 121 mg/dL — ABNORMAL HIGH (ref 70–99)
Glucose-Capillary: 100 mg/dL — ABNORMAL HIGH (ref 70–99)
Glucose-Capillary: 108 mg/dL — ABNORMAL HIGH (ref 70–99)

## 2019-09-27 LAB — D-DIMER, QUANTITATIVE: D-Dimer, Quant: 14.48 ug/mL-FEU — ABNORMAL HIGH (ref 0.00–0.50)

## 2019-09-27 LAB — PROCALCITONIN: Procalcitonin: <0.1 ng/mL

## 2019-09-27 LAB — C-REACTIVE PROTEIN: CRP: 1.6 mg/dL — ABNORMAL HIGH (ref <1.0)

## 2019-09-27 NOTE — Progress Notes (Signed)
PROGRESS NOTE    Larry [name]  [id number] DOB: 1950/12/20 DOA: 09/23/2019 PCP: Flossie Buffy, NP   Brief Narrative: As per chart: 69 y.o.malewith medical history significant ofmultiple sclerosis, spinal cord injury currently on wheelchair, diabetes, hyperlipidemia, carpal tunnel syndrome, kidney stones, hypertension who came from the home  On 09/23/19 with shortness of breath, wheezing as well as cough.he was exposed to his niece who was diagnosed with COVID-19 about 2 weeks PTA.He has some low-grade fever but no chills. Denied any nausea vomiting or abdominal pain.   In ED, Patient's oxygen sats were 58% on arrival by EMS. He was also 78% on arrival in the ER. In Ed He  Was on oxygen by nonrebreather bag with oxygen sat roughly around 90%. LDH is 467 troponin I 25. Ferritin 217 CRP of 12.5 procalcitonin 1.14 D-dimer is more than 20 fibrinogen 581. COVID-19 screen is positive. Chest x-ray shows bilateral lower lung airspace disease and consolidation. CBC and CMP are largely within normal. Patient was admitted for further management.     Subjective:  Seen and examined. oxygen down to 4 L high flow nasal cannula from 5.  Shortness of breath improving. Normally in wheelchair able to move upper extremities but not the lower extremities. Afebrile overnight  Assessment & Plan:  Acute respiratory failure due to COVID-19  Pneumonia/COVID-19 pneumonia: CT is showing patchy groundglass opacities throughout both lungs with mid lower zones predominant, per protocol patient is on remdesivir, Decadron, received Actemra.  Also on ceftriaxone/azithromycin day # 5 due to concern for infection, PROCAL WAS 1.14.  Also on linagliptin FOR dm for mortality benefits. Checked procal and <0.05- will stop antibiotics. COVID-19 Labs: Inflammatory markers are improving Recent Labs    09/25/19 0234 09/26/19 0227 09/27/19 0212  DDIMER >20.00* >20.00* 14.48*  FERRITIN 179 148 133  CRP 6.8*  3.2* 1.6*   Multilobar pulmonary emboli: Demonstrated findings suspicious for pulmonary emboli involving the segmental and subsegmental branches of the left lower lobe right middle lobe and lingular lobe no evidence of right heart strain.  Patient is on therapeutic Lovenox  Transaminitis- likely from Covid, AST 45 monitor  AKI, creat peaked from 1.6> 2.39, now improved to 0.8. bun 59>37  Hypertension: Blood pressure well controlled, on amlodipine, clonidine, HCTZ, irbesartan  Anemia of chronic disease hemoglobin is stable in 11 to 12 g range.  Elevated troponin in the setting of hypoxia 155>130>127, flat and downtrending.  Paraplegia/T10 compression fracture/overactive bladder/ MS: Continue supportive care, PT eval  GERD: Continue PPI  BPH: He has a condom catheter.  DM, stable hemoglobin A1c at 5.9 October/2020, continue sliding scale insulin, linagliptin Recent Labs  Lab 09/26/19 1652 09/26/19 1944 09/26/19 2308 09/27/19 0404 09/27/19 0733  GLUCAP 133* 91 119* 149* 118*   DVT prophylaxis: lovenox. Code Status: full Family Communication: plan of care discussed with patient at bedside. Status is: Inpatient  Remains inpatient appropriate because:Inpatient level of care appropriate due to severity of illness and Due to acute hypoxic respiratory failure and ongoing need for Covid/pneumonia treatment  Dispo: The patient is from: Home              Anticipated d/c is to: Home              Anticipated d/c date is: 2 days  Nutrition: Diet Order            Diet heart healthy/carb modified Room service appropriate? Yes; Fluid consistency: Thin  Diet effective now  Body mass index is 24.86 kg/m. Pressure Ulcer: Pressure Injury 09/24/19 Sacrum Medial Stage 2 -  Partial thickness loss of dermis presenting as a shallow open injury with a red, pink wound bed without slough. slight open skin  (Active)  09/24/19 2004  Location: Sacrum  Location Orientation: Medial    Staging: Stage 2 -  Partial thickness loss of dermis presenting as a shallow open injury with a red, pink wound bed without slough.  Wound Description (Comments): slight open skin   Present on Admission: Yes   Consultants:see note  Procedures:see note Microbiology: BLOOD Culture no growth to date   Medications: Scheduled Meds: . amLODipine  10 mg Oral Daily  . vitamin C  500 mg Oral Daily  . aspirin EC  81 mg Oral Daily  . baclofen  20 mg Oral TID  . Chlorhexidine Gluconate Cloth  6 each Topical Daily  . cloNIDine  0.1 mg Oral BID  . dexamethasone (DECADRON) injection  6 mg Intravenous Q24H  . enoxaparin (LOVENOX) injection  1 mg/kg Subcutaneous Q12H  . finasteride  5 mg Oral Daily  . hydrochlorothiazide  12.5 mg Oral Daily  . insulin aspart  0-9 Units Subcutaneous Q4H  . insulin detemir  0.075 Units/kg Subcutaneous BID  . Ipratropium-Albuterol  1 puff Inhalation Q6H  . irbesartan  150 mg Oral Daily  . linagliptin  5 mg Oral Daily  . mouth rinse  15 mL Mouth Rinse BID  . multivitamin with minerals  1 tablet Oral Daily  . oxybutynin  5 mg Oral QHS  . pregabalin  75 mg Oral BID  . traZODone  50 mg Oral QHS  . zinc sulfate  220 mg Oral Daily   Continuous Infusions: . azithromycin Stopped (09/27/19 0039)  . cefTRIAXone (ROCEPHIN)  IV Stopped (09/26/19 2347)    Antimicrobials: Anti-infectives (From admission, onward)   Start     Dose/Rate Route Frequency Ordered Stop   09/24/19 1000  remdesivir 100 mg in sodium chloride 0.9 % 100 mL IVPB  Status:  Discontinued     100 mg 200 mL/hr over 30 Minutes Intravenous Daily 09/23/19 2149 09/23/19 2200   09/24/19 1000  remdesivir 100 mg in sodium chloride 0.9 % 100 mL IVPB     100 mg 200 mL/hr over 30 Minutes Intravenous Daily 09/23/19 2021 09/27/19 1041   09/23/19 2200  cefTRIAXone (ROCEPHIN) 1 g in sodium chloride 0.9 % 100 mL IVPB     1 g 200 mL/hr over 30 Minutes Intravenous Every 24 hours 09/23/19 2149     09/23/19 2200   azithromycin (ZITHROMAX) 500 mg in sodium chloride 0.9 % 250 mL IVPB     500 mg 250 mL/hr over 60 Minutes Intravenous Every 24 hours 09/23/19 2149     09/23/19 2148  remdesivir 200 mg in sodium chloride 0.9% 250 mL IVPB  Status:  Discontinued     200 mg 580 mL/hr over 30 Minutes Intravenous Once 09/23/19 2149 09/23/19 2200   09/23/19 2100  remdesivir 100 mg in sodium chloride 0.9 % 100 mL IVPB     100 mg 200 mL/hr over 30 Minutes Intravenous Every 30 min 09/23/19 2021 09/24/19 0240       Objective: Vitals: Today's Vitals   09/27/19 0940 09/27/19 0941 09/27/19 1000 09/27/19 1200  BP:  112/67 133/86 133/87  Pulse:   77 85  Resp:      Temp:      TempSrc:      SpO2: 93%  93% Marland Kitchen)  89%  Weight:      Height:      PainSc:        Intake/Output Summary (Last 24 hours) at 09/27/2019 1243 Last data filed at 09/27/2019 0900 Gross per 24 hour  Intake 790 ml  Output 750 ml  Net 40 ml   Filed Weights   09/24/19 0551 09/24/19 1900 09/27/19 0400  Weight: 74.8 kg 69.8 kg 72 kg   Weight change:    Intake/Output from previous day: 04/27 0701 - 04/28 0700 In: 650 [P.O.:200; IV Piggyback:450] Out: 750 [Urine:750] Intake/Output this shift: Total I/O In: 240 [P.O.:240] Out: -   Examination:  General exam: AAOx3 ,NAD, weak appearing. HEENT:Oral mucosa moist, Ear/Nose WNL grossly,dentition normal. Respiratory system: bilaterally diminished, mild basal crackles, non tender. Cardiovascular system: S1 & S2 +, regular, No JVD. Gastrointestinal system: Abdomen soft, NT,ND, BS+. Nervous System:Alert, awake, moving upper extremities and unable to move lower extremities but sensation intact.   Extremities: No edema, distal peripheral pulses palpable.  Skin: No rashes,no icterus. MSK: Normal muscle bulk,tone, power  Data Reviewed: I have personally reviewed following labs and imaging studies CBC: Recent Labs  Lab 09/23/19 1449 09/24/19 0546 09/25/19 0234 09/26/19 0227 09/27/19 0212    WBC 8.9 6.0 5.9 8.2 8.4  NEUTROABS 8.0* 5.2 4.8 6.6 6.7  HGB 11.6* 12.2* 11.4* 11.8* 11.9*  HCT 34.6* 37.0* 35.6* 36.4* 37.2*  MCV 95.3 94.9 96.5 96.0 97.6  PLT 361 342 292 376 0000000   Basic Metabolic Panel: Recent Labs  Lab 09/23/19 1449 09/24/19 0546 09/25/19 0234 09/26/19 0227 09/27/19 0212  NA 141 145 142 140 140  K 3.9 4.1 4.2 3.9 4.3  CL 102 107 106 106 107  CO2 26 25 23 24 22   GLUCOSE 233* 125* 148* 163* 150*  BUN 59* 52* 46* 38* 37*  CREATININE 2.38* 1.60* 1.13 0.94 0.87  CALCIUM 8.8* 8.7* 8.7* 8.6* 8.5*   GFR: Estimated Creatinine Clearance: 72.8 mL/min (by C-G formula based on SCr of 0.87 mg/dL). Liver Function Tests: Recent Labs  Lab 09/23/19 1449 09/24/19 0546 09/25/19 0234 09/26/19 0227 09/27/19 0212  AST 55* 60* 54* 35 44*  ALT 18 18 21 20 23   ALKPHOS 66 67 75 88 76  BILITOT 0.8 0.8 0.6 0.5 0.8  PROT 6.9 7.0 6.1* 6.0* 5.7*  ALBUMIN 3.3* 3.1* 2.8* 2.8* 2.7*   No results for input(s): LIPASE, AMYLASE in the last 168 hours. No results for input(s): AMMONIA in the last 168 hours. Coagulation Profile: No results for input(s): INR, PROTIME in the last 168 hours. Cardiac Enzymes: No results for input(s): CKTOTAL, CKMB, CKMBINDEX, TROPONINI in the last 168 hours. BNP (last 3 results) Recent Labs    03/24/19 1555  PROBNP 33.0   HbA1C: No results for input(s): HGBA1C in the last 72 hours. CBG: Recent Labs  Lab 09/26/19 1652 09/26/19 1944 09/26/19 2308 09/27/19 0404 09/27/19 0733  GLUCAP 133* 91 119* 149* 118*   Lipid Profile: No results for input(s): CHOL, HDL, LDLCALC, TRIG, CHOLHDL, LDLDIRECT in the last 72 hours. Thyroid Function Tests: No results for input(s): TSH, T4TOTAL, FREET4, T3FREE, THYROIDAB in the last 72 hours. Anemia Panel: Recent Labs    09/26/19 0227 09/27/19 0212  FERRITIN 148 133   Sepsis Labs: Recent Labs  Lab 09/23/19 1449 09/23/19 2149  PROCALCITON  --  1.14  LATICACIDVEN 1.7  --     Recent Results (from  the past 240 hour(s))  Culture, blood (routine x 2)     Status:  None (Preliminary result)   Collection Time: 09/23/19  2:49 PM   Specimen: BLOOD  Result Value Ref Range Status   Specimen Description   Final    BLOOD RIGHT ANTECUBITAL Performed at [facility], [place] [address] [place], [place] [address]    Special Requests   Final    BOTTLES DRAWN AEROBIC AND ANAEROBIC Blood Culture adequate volume Performed at [place] [address], [place], [place] [address]    Culture   Final    NO GROWTH 4 DAYS Performed at [facility], [place] [address] [place], [place] [address]    Report Status PENDING  Incomplete  Culture, blood (routine x 2)     Status: None (Preliminary result)   Collection Time: 09/23/19  2:54 PM   Specimen: BLOOD  Result Value Ref Range Status   Specimen Description   Final    BLOOD LEFT ANTECUBITAL Performed at [facility], [place] [address] [place], [place] [address]    Special Requests   Final    BOTTLES DRAWN AEROBIC AND ANAEROBIC Blood Culture adequate volume Performed at [place] [address] [place], [place] [address]    Culture   Final    NO GROWTH 4 DAYS Performed at [facility], [place] [address] [place], [place] [address]    Report Status PENDING  Incomplete  MRSA PCR Screening     Status: None   Collection Time: 09/24/19  8:01 PM   Specimen: Nasal Mucosa; Nasopharyngeal  Result Value Ref Range Status   MRSA by PCR NEGATIVE NEGATIVE Final    Comment:        The GeneXpert MRSA Assay (FDA approved for NASAL specimens only), is one component of a comprehensive MRSA colonization surveillance program. It is not intended to diagnose MRSA infection nor to guide or monitor treatment for MRSA infections. Performed at [facility], [place] [address], [place],  [address]       Radiology Studies: CT ANGIO CHEST PE W OR WO CONTRAST  Result Date:  09/25/2019 CLINICAL DATA:  PE suspected, high prob. COVID positive. EXAM: CT ANGIOGRAPHY CHEST WITH CONTRAST TECHNIQUE: Multidetector CT imaging of the chest was performed using the standard protocol during bolus administration of intravenous contrast. Multiplanar CT image reconstructions and MIPs were obtained to evaluate the vascular anatomy. CONTRAST:  67mL OMNIPAQUE IOHEXOL 350 MG/ML SOLN COMPARISON:  Radiograph 09/23/2019. FINDINGS: Cardiovascular: Breathing motion artifact significantly limits evaluation. Despite motion artifact, findings suspicious for segmental pulmonary emboli in the left lower lobe and subsegmental left upper lobe branches, series 4, image 48 and 43 respectively. Probable segmental right middle lobe pulmonary emboli series 4, image 44. There is no right heart strain. Heart is normal in size. Thoracic aorta is normal in caliber. No aortic dissection. Mediastinum/Nodes: Prominent subcarinal node measuring 16 mm. There are small right hilar lymph nodes measuring up to 10 mm. No suspicious thyroid nodule. No esophageal wall thickening. Lungs/Pleura: Patchy ground-glass opacities throughout both lungs mid lower lung zone predominant. There is central bronchiectasis. Small left pleural effusion. Upper Abdomen: No acute findings. 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 Musculoskeletal: Multilevel degenerative change in the thoracic spine. Fusion of T7-T8 vertebral bodies. Review of the MIP images confirms the above findings. IMPRESSION: 1. Findings suspicious for pulmonary emboli involving the segmental and subsegmental branches of the left lower lobe, right middle lobe and lingula. Breathing motion artifact limits detailed  assessment, but thromboembolic burden appears small. No evidence of right heart strain. 2. Patchy ground-glass opacities throughout both lungs mid lower lung zone predominant, consistent with COVID-19 pneumonia. 3. Small left pleural  effusion. 4. Prominent subcarinal and right hilar lymph nodes are likely reactive. Critical Value/emergent results were called by telephone at the time of interpretation on 09/25/2019 at 8:42 pm to provider B Randol Kern, who verbally acknowledged these results. Electronically Signed   By: Keith Rake M.D.   On: 09/25/2019 20:43     LOS: 4 days   Time spent: More than 50% of that time was spent in counseling and/or coordination of care.  Antonieta Pert, MD [facility]  09/27/2019, 12:43 PM

## 2019-09-28 DIAGNOSIS — J9600 Acute respiratory failure, unspecified whether with hypoxia or hypercapnia: Secondary | ICD-10-CM | POA: Diagnosis not present

## 2019-09-28 DIAGNOSIS — U071 COVID-19: Secondary | ICD-10-CM | POA: Diagnosis not present

## 2019-09-28 LAB — CULTURE, BLOOD (ROUTINE X 2)
Culture: NO GROWTH
Culture: NO GROWTH
Special Requests: ADEQUATE
Special Requests: ADEQUATE

## 2019-09-28 LAB — COMPREHENSIVE METABOLIC PANEL
ALT: 27 U/L (ref 0–44)
AST: 52 U/L — ABNORMAL HIGH (ref 15–41)
Albumin: 2.8 g/dL — ABNORMAL LOW (ref 3.5–5.0)
Alkaline Phosphatase: 71 U/L (ref 38–126)
Anion gap: 11 (ref 5–15)
BUN: 37 mg/dL — ABNORMAL HIGH (ref 8–23)
CO2: 22 mmol/L (ref 22–32)
Calcium: 8.6 mg/dL — ABNORMAL LOW (ref 8.9–10.3)
Chloride: 105 mmol/L (ref 98–111)
Creatinine, Ser: 1.04 mg/dL (ref 0.61–1.24)
GFR calc Af Amer: >60 mL/min (ref >60)
GFR calc non Af Amer: >60 mL/min (ref >60)
Glucose, Bld: 182 mg/dL — ABNORMAL HIGH (ref 70–99)
Potassium: 4.7 mmol/L (ref 3.5–5.1)
Sodium: 138 mmol/L (ref 135–145)
Total Bilirubin: 0.5 mg/dL (ref 0.3–1.2)
Total Protein: 5.3 g/dL — ABNORMAL LOW (ref 6.5–8.1)

## 2019-09-28 LAB — FERRITIN: Ferritin: 121 ng/mL (ref 24–336)

## 2019-09-28 LAB — GLUCOSE, CAPILLARY
Glucose-Capillary: 172 mg/dL — ABNORMAL HIGH (ref 70–99)
Glucose-Capillary: 133 mg/dL — ABNORMAL HIGH (ref 70–99)
Glucose-Capillary: 132 mg/dL — ABNORMAL HIGH (ref 70–99)
Glucose-Capillary: 149 mg/dL — ABNORMAL HIGH (ref 70–99)
Glucose-Capillary: 156 mg/dL — ABNORMAL HIGH (ref 70–99)
Glucose-Capillary: 127 mg/dL — ABNORMAL HIGH (ref 70–99)

## 2019-09-28 LAB — CBC WITH DIFFERENTIAL/PLATELET
Abs Immature Granulocytes: 1.09 10*3/uL — ABNORMAL HIGH (ref 0.00–0.07)
Basophils Absolute: 0 10*3/uL (ref 0.0–0.1)
Basophils Relative: 0 %
Eosinophils Absolute: 0 10*3/uL (ref 0.0–0.5)
Eosinophils Relative: 0 %
HCT: 37.2 % — ABNORMAL LOW (ref 39.0–52.0)
Hemoglobin: 11.7 g/dL — ABNORMAL LOW (ref 13.0–17.0)
Immature Granulocytes: 14 %
Lymphocytes Relative: 3 %
Lymphs Abs: 0.2 10*3/uL — ABNORMAL LOW (ref 0.7–4.0)
MCH: 30.6 pg (ref 26.0–34.0)
MCHC: 31.5 g/dL (ref 30.0–36.0)
MCV: 97.4 fL (ref 80.0–100.0)
Monocytes Absolute: 0.1 10*3/uL (ref 0.1–1.0)
Monocytes Relative: 1 %
Neutro Abs: 6.5 10*3/uL (ref 1.7–7.7)
Neutrophils Relative %: 82 %
Platelets: 382 10*3/uL (ref 150–400)
RBC: 3.82 MIL/uL — ABNORMAL LOW (ref 4.22–5.81)
RDW: 14.4 % (ref 11.5–15.5)
WBC: 7.9 10*3/uL (ref 4.0–10.5)
nRBC: 1.9 % — ABNORMAL HIGH (ref 0.0–0.2)

## 2019-09-28 LAB — D-DIMER, QUANTITATIVE: D-Dimer, Quant: 12.46 ug/mL-FEU — ABNORMAL HIGH (ref 0.00–0.50)

## 2019-09-28 LAB — C-REACTIVE PROTEIN: CRP: 1.1 mg/dL — ABNORMAL HIGH (ref <1.0)

## 2019-09-28 MED ORDER — SALINE SPRAY 0.65 % NA SOLN
1.0000 | NASAL | Status: DC | PRN
  Administered 2019-09-30 – 2019-10-03 (×2): 1 via NASAL
  Filled 2019-09-28: qty 44

## 2019-09-28 NOTE — Evaluation (Signed)
Physical Therapy Evaluation Patient Details Name: Larry [name] MRN: [id number] DOB: 1951/01/29 Today's Date: 09/28/2019   History of Present Illness  As per chart: 69 y.o. male with medical history significant of multiple sclerosis, T 10 spinal cord injury currently mod I with wheelchair, diabetes, hyperlipidemia, carpal tunnel syndrome, kidney stones, hypertension who came from the home  On 09/23/19 with shortness of breath, wheezing as well as cough.he was exposed to his niece who was diagnosed with COVID-19 about 2 weeks PTA.  Clinical Impression  The patient reports highly functional at [facility] level PTA. Patient received on RA at 95% SPO2 drops to 88%. Patient plans to return home if able to maintain transfers at [facility]. I level. Pt admitted with above diagnosis.  Pt currently with functional limitations due to the deficits listed below (see PT Problem List). Pt will benefit from skilled PT to increase their independence and safety with mobility to allow discharge to the venue listed below.       Follow Up Recommendations Home health PT;Supervision/Assistance - 24 hour    Equipment Recommendations  None recommended by PT    Recommendations for Other Services OT consult     Precautions / Restrictions Precautions Precautions: Fall Precaution Comments: droplet      Mobility  Bed Mobility Overal bed mobility: Needs Assistance Bed Mobility: Supine to Sit;Sit to Supine     Supine to sit: Min assist Sit to supine: Min assist   General bed mobility comments: assist with legsover to bed  edge and back onto bed, scoots up in bed with bil UE's  Transfers                 General transfer comment: TBA  Ambulation/Gait                Stairs            Wheelchair Mobility    Modified Rankin (Stroke Patients Only)       Balance Overall balance assessment: Needs assistance Sitting-balance support: Feet unsupported;No upper extremity supported Sitting  balance-Leahy Scale: Fair Sitting balance - Comments: can sit without UE support       Standing balance comment: nt                             Pertinent Vitals/Pain Pain Assessment: No/denies pain    Home Living Family/patient expects to be discharged to:: Private residence Living Arrangements: Alone Available Help at Discharge: Friend(s);Available PRN/intermittently Type of Home: House Home Access: Level entry     Home Layout: One level Home Equipment: Wheelchair - manual;Tub bench Additional Comments: neighbor gets mail    Prior Function Level of Independence: Independent with assistive device(s)   Gait / Transfers Assistance Needed: transfers to [facility]  , to Toilet, to bed mod I. uses lyft and transfes to car     Comments: has someone cook meals and he is able to fix and eat them.     Hand Dominance        Extremity/Trunk Assessment   Upper Extremity Assessment Upper Extremity Assessment: Defer to OT evaluation    Lower Extremity Assessment Lower Extremity Assessment: LLE deficits/detail;RLE deficits/detail RLE Deficits / Details: has gross 2/5 knee ext, hip flex is 2/5, trace dorsiflex RLE Sensation: decreased light touch RLE Coordination: decreased fine motor LLE Deficits / Details: slightly stronge 2+ knee extension LLE Sensation: decreased light touch LLE Coordination: decreased fine motor    Cervical / Trunk  Assessment Cervical / Trunk Assessment: (sits with slight trunk flexed)  Communication   Communication: No difficulties  Cognition Arousal/Alertness: Awake/alert Behavior During Therapy: WFL for tasks assessed/performed Overall Cognitive Status: Within Functional Limits for tasks assessed                                        General Comments      Exercises     Assessment/Plan    PT Assessment Patient needs continued PT services  PT Problem List Decreased strength;Decreased balance;Decreased  mobility;Cardiopulmonary status limiting activity;Decreased activity tolerance       PT Treatment Interventions Functional mobility training;Balance training;Patient/family education;Therapeutic activities;Wheelchair mobility training;Therapeutic exercise    PT Goals (Current goals can be found in the Care Plan section)  Acute Rehab PT Goals Patient Stated Goal: go home PT Goal Formulation: With patient Time For Goal Achievement: 09/28/19 Potential to Achieve Goals: Good    Frequency Min 3X/week   Barriers to discharge        Co-evaluation               AM-PAC PT "6 Clicks" Mobility  Outcome Measure Help needed turning from your back to your side while in a flat bed without using bedrails?: A Little Help needed moving from lying on your back to sitting on the side of a flat bed without using bedrails?: A Little Help needed moving to and from a bed to a chair (including a wheelchair)?: A Lot Help needed standing up from a chair using your arms (e.g., wheelchair or bedside chair)?: A Lot Help needed to walk in hospital room?: Total Help needed climbing 3-5 steps with a railing? : Total 6 Click Score: 12    End of Session   Activity Tolerance: Patient tolerated treatment well Patient left: in bed;with call bell/phone within reach;with bed alarm set Nurse Communication: Mobility status PT Visit Diagnosis: Muscle weakness (generalized) (M62.81);Other symptoms and signs involving the nervous system (R29.898)    Time: [id number] PT Time Calculation (min) (ACUTE ONLY): 35 min   Charges:   PT Evaluation $PT Eval Low Complexity: 1 Low PT Treatments $Therapeutic Activity: 8-22 mins        Tresa Endo PT Acute Rehabilitation Services Pager [phone] Office [phone]   Claretha Cooper 09/28/2019, 5:56 PM

## 2019-09-28 NOTE — Progress Notes (Signed)
ANTICOAGULATION CONSULT NOTE - Follow Up Consult  Pharmacy Consult for Enoxaparin Indication: pulmonary embolus  Allergies  Allergen Reactions  . No Known Allergies     Patient Measurements: Height: 5\' 7"  (170.2 cm) Weight: 72 kg (158 lb 11.7 oz) IBW/kg (Calculated) : 66.1  Vital Signs: Temp: 98.2 F (36.8 C) (04/29 0300) Temp Source: Oral (04/29 0300) BP: 109/70 (04/29 0300) Pulse Rate: 62 (04/29 0500)  Labs: Recent Labs    09/26/19 0227 09/26/19 0227 09/27/19 0212 09/28/19 0234  HGB 11.8*   < > 11.9* 11.7*  HCT 36.4*  --  37.2* 37.2*  PLT 376  --  384 382  CREATININE 0.94  --  0.87 1.04   < > = values in this interval not displayed.    Estimated Creatinine Clearance: 60.9 mL/min (by C-G formula based on SCr of 1.04 mg/dL).   Medical History: Past Medical History:  Diagnosis Date  . Abnormality of gait   . Acute delirium 12/11/2011  . CTS (carpal tunnel syndrome) 09/26/2015  . Diabetes mellitus   . Hypercholesterolemia   . Hypertension   . Kidney stones   . Leukocytopenia 05/22/2013  . MS (multiple sclerosis) [facility])    sees [facility]  . T10 spinal cord injury ([place]) 12/12/2011   hx surgery 1996 with bilat gait abnormality since    Assessment: Larry [name] admitted with COVID-19 pneumonia. D-dimer > 20. CTa chest today: Findings suspicious for pulmonary emboli involving the segmental and subsegmental branches of the left lower lobe, right middle lobe and lingula. No evidence of right heart strain. Pharmacy consulted for Enoxaparin dosing.  Today, 09/28/2019  SCr 1.04 with CrCl ~ 60 ml/min.   CBC stable: Hgb 11.7, Pltc WNL.   No bleeding reported  Goal of Therapy:  Anti-Xa level 0.6-1 units/ml 4hrs after LMWH dose given Monitor platelets by anticoagulation protocol: Yes   Plan:  Continue Enoxaparin 1mg /kg (70mg ) SQ q12h Monitor renal function, CBC, and for s/sx of bleeding  Peggyann Juba, PharmD, BCPS Pharmacy: [phone] 09/28/2019,8:36 AM

## 2019-09-28 NOTE — Progress Notes (Signed)
PROGRESS NOTE    Larry [name]  [id number] DOB: 06-10-1950 DOA: 09/23/2019 PCP: Flossie Buffy, NP   Brief Narrative: As per chart: 69 y.o.malewith medical history significant ofmultiple sclerosis, spinal cord injury currently on wheelchair, diabetes, hyperlipidemia, carpal tunnel syndrome, kidney stones, hypertension who came from the home  On 09/23/19 with shortness of breath, wheezing as well as cough.he was exposed to his niece who was diagnosed with COVID-19 about 2 weeks PTA.He has some low-grade fever but no chills. Denied any nausea vomiting or abdominal pain.   In ED, Patient's oxygen sats were 58% on arrival by EMS. He was also 78% on arrival in the ER. In Ed He  Was on oxygen by nonrebreather bag with oxygen sat roughly around 90%. LDH is 467 troponin I 25. Ferritin 217 CRP of 12.5 procalcitonin 1.14 D-dimer is more than 20 fibrinogen 581. COVID-19 screen is positive. Chest x-ray shows bilateral lower lung airspace disease and consolidation. CBC and CMP are largely within normal. Patient was admitted for further management. Pt clinically improving. 4/28-completed remdesivir, and antibiotics, procalcitonin WNL, CRP down to 1.6   Subjective: Afebrile overnight. 4/28 Switched from 4L HFNC to 4L nasal cannula and now at 2l [place]. Normally in wheelchair able to move upper extremities but not the lower extremities. Waiting PT eval  Assessment & Plan:  Acute respiratory failure due to COVID-19  Pneumonia/COVID-19 pneumonia: CT is showing patchy groundglass opacities throughout both lungs with mid lower zones predominant.  Received Actemra. Completed remdesivir 5 doses and 5 days of ceftriaxone/azithromycin-procalcitonin WNL.Also on linagliptin for dm for mortality benefits. Improving inflammatory markers. 4/28 Switched from 4L HFNC to 4L nasal cannula. Today to 2l [place]. Cont steroid, supplemental nasal oxygen and wean off , encourage increase in activity w/ PT today.  COVID-19 Labs: Inflammatory markers are much improved. Recent Labs    09/26/19 0227 09/27/19 0212 09/28/19 0234  DDIMER >20.00* 14.48* 12.46*  FERRITIN 148 133 121  CRP 3.2* 1.6* 1.1*   Multilobar pulmonary emboli: Demonstrated findings suspicious for pulmonary emboli involving the segmental and subsegmental branches of the left lower lobe right middle lobe and lingular lobe no evidence of right heart strain.  Patient is on therapeutic Lovenox-will switch to DOAC- discussed w/ pt pharmacy consulted.  Transaminitis- likely from Covid, iAST  45>52. monitor  AKI, creat peaked from 1.6> 2.39, now improved to 0.8. bun 59>37  Hypertension: Blood pressure well controlled, on amlodipine, clonidine, HCTZ, irbesartan  Anemia of chronic disease hemoglobin is stable in 11 to 12 g range.  Elevated troponin in the setting of hypoxia 155>130>127, flat and downtrending.  Suspect due to h poxia/PE and Covid infection.  Paraplegia/T10 compression fracture/overactive bladder/ MS: Continue supportive care, PT eval  GERD: Continue PPI  BPH: He has a condom catheter.  DM, type II stable hemoglobin A1c at 5.9 October/2020, continue sliding scale insulin, lantus, linagliptin Recent Labs  Lab 09/27/19 1530 09/27/19 1959 09/27/19 2223 09/28/19 0307 09/28/19 0849  GLUCAP 121* 100* 108* 172* 133*   DVT prophylaxis: lovenox. Code Status: full Family Communication: plan of care discussed with patient at bedside.  Status is: Inpatient Remains inpatient appropriate because:Inpatient level of care appropriate due to severity of illness and Due to ongoing acute hypoxic respiratory failure due to PE, Covid pneumonia.  Patient understood the need, ordered transfer to [place] with telemetry Obtain PT eval mobilize today when down oxygen and hopefully discharge tomorrow.  Dispo: The patient is from: Home  Anticipated d/c is to: Home              Anticipated d/c date is: 1 day  Nutrition:  Diet Order            Diet heart healthy/carb modified Room service appropriate? Yes; Fluid consistency: Thin  Diet effective now            Body mass index is 24.86 kg/m. Pressure Ulcer: Pressure Injury 09/24/19 Sacrum Medial Stage 2 -  Partial thickness loss of dermis presenting as a shallow open injury with a red, pink wound bed without slough. slight open skin  (Active)  09/24/19 2004  Location: Sacrum  Location Orientation: Medial  Staging: Stage 2 -  Partial thickness loss of dermis presenting as a shallow open injury with a red, pink wound bed without slough.  Wound Description (Comments): slight open skin   Present on Admission: Yes   Consultants:see note  Procedures:see note Microbiology: BLOOD Culture no growth to date   Medications: Scheduled Meds: . amLODipine  10 mg Oral Daily  . vitamin C  500 mg Oral Daily  . aspirin EC  81 mg Oral Daily  . baclofen  20 mg Oral TID  . Chlorhexidine Gluconate Cloth  6 each Topical Daily  . cloNIDine  0.1 mg Oral BID  . dexamethasone (DECADRON) injection  6 mg Intravenous Q24H  . enoxaparin (LOVENOX) injection  1 mg/kg Subcutaneous Q12H  . finasteride  5 mg Oral Daily  . hydrochlorothiazide  12.5 mg Oral Daily  . insulin aspart  0-9 Units Subcutaneous Q4H  . insulin detemir  0.075 Units/kg Subcutaneous BID  . Ipratropium-Albuterol  1 puff Inhalation Q6H  . irbesartan  150 mg Oral Daily  . linagliptin  5 mg Oral Daily  . mouth rinse  15 mL Mouth Rinse BID  . multivitamin with minerals  1 tablet Oral Daily  . oxybutynin  5 mg Oral QHS  . pregabalin  75 mg Oral BID  . traZODone  50 mg Oral QHS  . zinc sulfate  220 mg Oral Daily   Continuous Infusions:   Antimicrobials: Anti-infectives (From admission, onward)   Start     Dose/Rate Route Frequency Ordered Stop   09/24/19 1000  remdesivir 100 mg in sodium chloride 0.9 % 100 mL IVPB  Status:  Discontinued     100 mg 200 mL/hr over 30 Minutes Intravenous Daily 09/23/19  2149 09/23/19 2200   09/24/19 1000  remdesivir 100 mg in sodium chloride 0.9 % 100 mL IVPB     100 mg 200 mL/hr over 30 Minutes Intravenous Daily 09/23/19 2021 09/27/19 1041   09/23/19 2200  cefTRIAXone (ROCEPHIN) 1 g in sodium chloride 0.9 % 100 mL IVPB  Status:  Discontinued     1 g 200 mL/hr over 30 Minutes Intravenous Every 24 hours 09/23/19 2149 09/28/19 0856   09/23/19 2200  azithromycin (ZITHROMAX) 500 mg in sodium chloride 0.9 % 250 mL IVPB  Status:  Discontinued     500 mg 250 mL/hr over 60 Minutes Intravenous Every 24 hours 09/23/19 2149 09/28/19 0856   09/23/19 2148  remdesivir 200 mg in sodium chloride 0.9% 250 mL IVPB  Status:  Discontinued     200 mg 580 mL/hr over 30 Minutes Intravenous Once 09/23/19 2149 09/23/19 2200   09/23/19 2100  remdesivir 100 mg in sodium chloride 0.9 % 100 mL IVPB     100 mg 200 mL/hr over 30 Minutes Intravenous Every 30 min 09/23/19 2021 09/24/19  0240       Objective: Vitals: Today's Vitals   09/28/19 0500 09/28/19 0800 09/28/19 0830 09/28/19 1035  BP:    133/81  Pulse: 62   69  Resp:      Temp:  98.5 F (36.9 C)    TempSrc:  Oral    SpO2: 100%   99%  Weight:      Height:      PainSc:   0-No pain     Intake/Output Summary (Last 24 hours) at 09/28/2019 1156 Last data filed at 09/28/2019 0400 Gross per 24 hour  Intake 240 ml  Output 775 ml  Net -535 ml   Filed Weights   09/24/19 0551 09/24/19 1900 09/27/19 0400  Weight: 74.8 kg 69.8 kg 72 kg   Weight change:    Intake/Output from previous day: 04/28 0701 - 04/29 0700 In: 578 [P.O.:480; IV Piggyback:98] Out: 775 [Urine:775] Intake/Output this shift: No intake/output data recorded.  Examination:  General exam: AAOx3 ,NAD, weak appearing, on 4L [place] weanning to 2l. HEENT:Oral mucosa moist, Ear/Nose WNL grossly,dentition normal. Respiratory system: bilaterally diminished, mild basal crackles, non tender. Cardiovascular system: S1 & S2 +, regular, No JVD. Gastrointestinal  system: Abdomen soft, NT,ND, BS+. Nervous System:Alert, awake, moving upper extremities and unable to move lower extremities but sensation intact.   Extremities: No edema, distal peripheral pulses palpable.  Skin: No rashes,no icterus. MSK: Normal muscle bulk,tone, power  Data Reviewed: I have personally reviewed following labs and imaging studies CBC: Recent Labs  Lab 09/24/19 0546 09/25/19 0234 09/26/19 0227 09/27/19 0212 09/28/19 0234  WBC 6.0 5.9 8.2 8.4 7.9  NEUTROABS 5.2 4.8 6.6 6.7 6.5  HGB 12.2* 11.4* 11.8* 11.9* 11.7*  HCT 37.0* 35.6* 36.4* 37.2* 37.2*  MCV 94.9 96.5 96.0 97.6 97.4  PLT 342 292 376 384 [id number]   Basic Metabolic Panel: Recent Labs  Lab 09/24/19 0546 09/25/19 0234 09/26/19 0227 09/27/19 0212 09/28/19 0234  NA 145 142 140 140 138  K 4.1 4.2 3.9 4.3 4.7  CL 107 106 106 107 105  CO2 25 23 24 22 22   GLUCOSE 125* 148* 163* 150* 182*  BUN 52* 46* 38* 37* 37*  CREATININE 1.60* 1.13 0.94 0.87 1.04  CALCIUM 8.7* 8.7* 8.6* 8.5* 8.6*   GFR: Estimated Creatinine Clearance: 60.9 mL/min (by C-G formula based on SCr of 1.04 mg/dL). Liver Function Tests: Recent Labs  Lab 09/24/19 0546 09/25/19 0234 09/26/19 0227 09/27/19 0212 09/28/19 0234  AST 60* 54* 35 44* 52*  ALT 18 21 20 23 27   ALKPHOS 67 75 88 76 71  BILITOT 0.8 0.6 0.5 0.8 0.5  PROT 7.0 6.1* 6.0* 5.7* 5.3*  ALBUMIN 3.1* 2.8* 2.8* 2.7* 2.8*   No results for input(s): LIPASE, AMYLASE in the last 168 hours. No results for input(s): AMMONIA in the last 168 hours. Coagulation Profile: No results for input(s): INR, PROTIME in the last 168 hours. Cardiac Enzymes: No results for input(s): CKTOTAL, CKMB, CKMBINDEX, TROPONINI in the last 168 hours. BNP (last 3 results) Recent Labs    03/24/19 1555  PROBNP 33.0   HbA1C: No results for input(s): HGBA1C in the last 72 hours. CBG: Recent Labs  Lab 09/27/19 1530 09/27/19 1959 09/27/19 2223 09/28/19 0307 09/28/19 0849  GLUCAP 121* 100* 108*  172* 133*   Lipid Profile: No results for input(s): CHOL, HDL, LDLCALC, TRIG, CHOLHDL, LDLDIRECT in the last 72 hours. Thyroid Function Tests: No results for input(s): TSH, T4TOTAL, FREET4, T3FREE, THYROIDAB in the last 72  hours. Anemia Panel: Recent Labs    09/27/19 0212 09/28/19 0234  FERRITIN 133 121   Sepsis Labs: Recent Labs  Lab 09/23/19 1449 09/23/19 2149 09/27/19 1357  PROCALCITON  --  1.14 <0.10  LATICACIDVEN 1.7  --   --     Recent Results (from the past 240 hour(s))  Culture, blood (routine x 2)     Status: None   Collection Time: 09/23/19  2:49 PM   Specimen: BLOOD  Result Value Ref Range Status   Specimen Description   Final    BLOOD RIGHT ANTECUBITAL Performed at [facility], [place] [address], [place], [place] [address]    Special Requests   Final    BOTTLES DRAWN AEROBIC AND ANAEROBIC Blood Culture adequate volume Performed at [place] [address], [place], [place] [address]    Culture   Final    NO GROWTH 5 DAYS Performed at [facility], [place] [address], [place], [place] [address]    Report Status 09/28/2019 FINAL  Final  Culture, blood (routine x 2)     Status: None   Collection Time: 09/23/19  2:54 PM   Specimen: BLOOD  Result Value Ref Range Status   Specimen Description   Final    BLOOD LEFT ANTECUBITAL Performed at [facility], [place] [address], [place], [place] [address]    Special Requests   Final    BOTTLES DRAWN AEROBIC AND ANAEROBIC Blood Culture adequate volume Performed at [place] [address] [place], [place] [address]    Culture   Final    NO GROWTH 5 DAYS Performed at [facility], [place] [address] [place], [place] [address]    Report Status 09/28/2019 FINAL  Final  MRSA PCR Screening     Status: None   Collection Time: 09/24/19  8:01 PM   Specimen: Nasal Mucosa; Nasopharyngeal  Result Value Ref Range Status   MRSA by PCR NEGATIVE NEGATIVE Final     Comment:        The GeneXpert MRSA Assay (FDA approved for NASAL specimens only), is one component of a comprehensive MRSA colonization surveillance program. It is not intended to diagnose MRSA infection nor to guide or monitor treatment for MRSA infections. Performed at [facility], [place] [address] [place], [place] [address]       Radiology Studies: No results found.   LOS: 5 days   Time spent: More than 50% of that time was spent in counseling and/or coordination of care.  Antonieta Pert, MD [facility]  09/28/2019, 11:56 AM

## 2019-09-29 DIAGNOSIS — J9600 Acute respiratory failure, unspecified whether with hypoxia or hypercapnia: Secondary | ICD-10-CM | POA: Diagnosis not present

## 2019-09-29 DIAGNOSIS — U071 COVID-19: Secondary | ICD-10-CM | POA: Diagnosis not present

## 2019-09-29 LAB — GLUCOSE, CAPILLARY
Glucose-Capillary: 100 mg/dL — ABNORMAL HIGH (ref 70–99)
Glucose-Capillary: 107 mg/dL — ABNORMAL HIGH (ref 70–99)
Glucose-Capillary: 125 mg/dL — ABNORMAL HIGH (ref 70–99)
Glucose-Capillary: 128 mg/dL — ABNORMAL HIGH (ref 70–99)
Glucose-Capillary: 138 mg/dL — ABNORMAL HIGH (ref 70–99)
Glucose-Capillary: 179 mg/dL — ABNORMAL HIGH (ref 70–99)

## 2019-09-29 MED ORDER — IPRATROPIUM-ALBUTEROL 20-100 MCG/ACT IN AERS
1.0000 | INHALATION_SPRAY | Freq: Three times a day (TID) | RESPIRATORY_TRACT | Status: DC
  Administered 2019-09-29 (×3): 1 via RESPIRATORY_TRACT

## 2019-09-29 MED ORDER — IPRATROPIUM-ALBUTEROL 20-100 MCG/ACT IN AERS: 1.0000 | INHALATION_SPRAY | Freq: Four times a day (QID) | RESPIRATORY_TRACT | Status: DC | PRN

## 2019-09-29 NOTE — Progress Notes (Signed)
PROGRESS NOTE    Job Savard  [id number] DOB: 04-01-1951 DOA: 09/23/2019 PCP: Flossie Buffy, NP   Brief Narrative:  As per chart: 69 y.o.malewith medical history significant ofmultiple sclerosis, spinal cord injury currently on wheelchair, diabetes, hyperlipidemia, carpal tunnel syndrome, kidney stones, hypertension who came from the home  On 09/23/19 with shortness of breath, wheezing as well as cough. He was exposed to his niece who was diagnosed with COVID-19 about 2 weeks PTA. He has some low-grade fever but no chills. Denied any nausea vomiting or abdominal pain.   In ED, Patient's oxygen sats were 58% on arrival by EMS. He was also 78% on arrival in the ER. In Ed He  was on oxygen by nonrebreather bag with oxygen sat roughly around 90%. LDH is 467 troponin I 25. Ferritin 217 CRP of 12.5 procalcitonin 1.14 D-dimer is more than 20 fibrinogen 581. COVID-19 screen is positive. Chest x-ray shows bilateral lower lung airspace disease and consolidation. CBC and CMP are largely within normal. Patient was admitted for further management. Pt clinically improving. 4/28-completed remdesivir, and antibiotics, procalcitonin WNL, CRP down to 1.6   Assessment & Plan:   Principal Problem:   Acute respiratory failure due to COVID-19 [facility]) Active Problems:   Paraplegia (HCC)   DM (diabetes mellitus) ([place])   MS (multiple sclerosis) ([place])   Hypertension   Overactive bladder   History of spinal cord injury   GERD (gastroesophageal reflux disease)   Benign prostatic hyperplasia with lower urinary tract symptoms   Pressure injury of skin  Acute respiratory failure due to COVID-19  Pneumonia/COVID-19 pneumonia: CT is showing patchy groundglass opacities throughout both lungs with mid lower zones predominant.  Received Actemra. Completed remdesivir 5 doses and 5 days of ceftriaxone /azithromycin-procalcitonin WNL. Also on linagliptin for DM for mortality benefits. Improving  inflammatory markers. 4/28 Switched from 4L HFNC to 4L nasal cannula. Today to 2l [place]. Continue steroid, supplemental nasal oxygen and wean off , encourage increase in activity w/ PT today.  COVID-19 Labs: Inflammatory markers are much improved.  Multilobar pulmonary emboli: Demonstrated findings suspicious for pulmonary emboli involving the segmental and subsegmental branches of the left lower lobe right middle lobe and lingular lobe no evidence of right heart strain.  Patient is on therapeutic Lovenox-will switch to DOAC- discussed w/ pt pharmacy consulted.  Transaminitis- likely from Covid, iAST  45>52. monitor  AKI: Improved. creat peaked from 1.6> 2.39, now improved to 0.8. bun 59>37  Hypertension: Blood pressure well controlled, on amlodipine, clonidine, HCTZ, irbesartan  Anemia of chronic disease;  Hemoglobin is stable in 11 to 12 g range.  Elevated troponin in the setting of hypoxia 155>130>127, flat and downtrending.  Suspect due to hypoxia/PE and Covid infection.   Paraplegia/T10 compression fracture/overactive bladder/ MS: Continue supportive care, PT eval  GERD: Continue PPI  BPH: He has a condom catheter.  DM: type II stable hemoglobin A1c at 5.9 October/2020, continue sliding scale insulin, lantus, linagliptin   Code Status: full Family Communication: plan of care discussed with patient at bedside.  Status : Inpatient Remains inpatient appropriate because:Inpatient level of care appropriate due to severity of illness and Due to ongoing acute hypoxic respiratory failure due to PE, Covid pneumonia.  Patient understood the need, ordered transfer to [place] with telemetry. Obtain PT eval mobilize today when down oxygen and hopefully discharge tomorrow.  Dispo: The patient is from: Home  Anticipated d/c is to: Home Anticipated d/c date in: 1 day   Consultants:  Procedures:   Antimicrobials:  Anti-infectives (From admission, onward)   Start     Dose/Rate  Route Frequency Ordered Stop   09/24/19 1000  remdesivir 100 mg in sodium chloride 0.9 % 100 mL IVPB  Status:  Discontinued     100 mg 200 mL/hr over 30 Minutes Intravenous Daily 09/23/19 2149 09/23/19 2200   09/24/19 1000  remdesivir 100 mg in sodium chloride 0.9 % 100 mL IVPB     100 mg 200 mL/hr over 30 Minutes Intravenous Daily 09/23/19 2021 09/27/19 1041   09/23/19 2200  cefTRIAXone (ROCEPHIN) 1 g in sodium chloride 0.9 % 100 mL IVPB  Status:  Discontinued     1 g 200 mL/hr over 30 Minutes Intravenous Every 24 hours 09/23/19 2149 09/28/19 0856   09/23/19 2200  azithromycin (ZITHROMAX) 500 mg in sodium chloride 0.9 % 250 mL IVPB  Status:  Discontinued     500 mg 250 mL/hr over 60 Minutes Intravenous Every 24 hours 09/23/19 2149 09/28/19 0856   09/23/19 2148  remdesivir 200 mg in sodium chloride 0.9% 250 mL IVPB  Status:  Discontinued     200 mg 580 mL/hr over 30 Minutes Intravenous Once 09/23/19 2149 09/23/19 2200   09/23/19 2100  remdesivir 100 mg in sodium chloride 0.9 % 100 mL IVPB     100 mg 200 mL/hr over 30 Minutes Intravenous Every 30 min 09/23/19 2021 09/24/19 0240      Subjective: Patient was seen and examined at bedside.  No overnight events.  Patient was on 2 L supplemental oxygen saturating 96%. Normally in wheelchair able to move upper extremities but not the lower extremities.  Objective: Vitals:   09/29/19 0600 09/29/19 0700 09/29/19 0800 09/29/19 0900  BP: 99/61 111/63 137/73 121/67  Pulse: (!) 58 (!) 58 64 (!) 57  Resp:      Temp:   98.9 F (37.2 C)   TempSrc:   Oral   SpO2: 96% 95% 96% 94%  Weight:      Height:        Intake/Output Summary (Last 24 hours) at 09/29/2019 1137 Last data filed at 09/29/2019 0500 Gross per 24 hour  Intake 360 ml  Output 1550 ml  Net -1190 ml   Filed Weights   09/24/19 0551 09/24/19 1900 09/27/19 0400  Weight: 74.8 kg 69.8 kg 72 kg    Examination:  General exam: Appears calm and comfortable  Respiratory system:  Clear to auscultation. Respiratory effort normal. Cardiovascular system: S1 & S2 heard, RRR. No JVD, murmurs, rubs, gallops or clicks. No pedal edema. Gastrointestinal system: Abdomen is nondistended, soft and nontender. No organomegaly or masses felt. Normal bowel sounds heard. Central nervous system: Alert and oriented. No focal neurological deficits. Extremities: moving upper extremities and unable to move lower extremities but sensation intact.   Skin: No rashes, lesions or ulcers Psychiatry: Judgement and insight appear normal. Mood & affect appropriate.     Data Reviewed: I have personally reviewed following labs and imaging studies  CBC: Recent Labs  Lab 09/24/19 0546 09/25/19 0234 09/26/19 0227 09/27/19 0212 09/28/19 0234  WBC 6.0 5.9 8.2 8.4 7.9  NEUTROABS 5.2 4.8 6.6 6.7 6.5  HGB 12.2* 11.4* 11.8* 11.9* 11.7*  HCT 37.0* 35.6* 36.4* 37.2* 37.2*  MCV 94.9 96.5 96.0 97.6 97.4  PLT 342 292 376 384 [id number]   Basic Metabolic Panel: Recent Labs  Lab 09/24/19 0546 09/25/19 0234 09/26/19 0227 09/27/19 0212 09/28/19 0234  NA 145 142 140 140 138  K  4.1 4.2 3.9 4.3 4.7  CL 107 106 106 107 105  CO2 25 23 24 22 22   GLUCOSE 125* 148* 163* 150* 182*  BUN 52* 46* 38* 37* 37*  CREATININE 1.60* 1.13 0.94 0.87 1.04  CALCIUM 8.7* 8.7* 8.6* 8.5* 8.6*   GFR: Estimated Creatinine Clearance: 60.9 mL/min (by C-G formula based on SCr of 1.04 mg/dL). Liver Function Tests: Recent Labs  Lab 09/24/19 0546 09/25/19 0234 09/26/19 0227 09/27/19 0212 09/28/19 0234  AST 60* 54* 35 44* 52*  ALT 18 21 20 23 27   ALKPHOS 67 75 88 76 71  BILITOT 0.8 0.6 0.5 0.8 0.5  PROT 7.0 6.1* 6.0* 5.7* 5.3*  ALBUMIN 3.1* 2.8* 2.8* 2.7* 2.8*   No results for input(s): LIPASE, AMYLASE in the last 168 hours. No results for input(s): AMMONIA in the last 168 hours. Coagulation Profile: No results for input(s): INR, PROTIME in the last 168 hours. Cardiac Enzymes: No results for input(s): CKTOTAL, CKMB,  CKMBINDEX, TROPONINI in the last 168 hours. BNP (last 3 results) Recent Labs    03/24/19 1555  PROBNP 33.0   HbA1C: No results for input(s): HGBA1C in the last 72 hours. CBG: Recent Labs  Lab 09/28/19 1834 09/28/19 1953 09/28/19 2316 09/29/19 0424 09/29/19 0821  GLUCAP 149* 156* 127* 179* 125*   Lipid Profile: No results for input(s): CHOL, HDL, LDLCALC, TRIG, CHOLHDL, LDLDIRECT in the last 72 hours. Thyroid Function Tests: No results for input(s): TSH, T4TOTAL, FREET4, T3FREE, THYROIDAB in the last 72 hours. Anemia Panel: Recent Labs    09/27/19 0212 09/28/19 0234  FERRITIN 133 121   Sepsis Labs: Recent Labs  Lab 09/23/19 1449 09/23/19 2149 09/27/19 1357  PROCALCITON  --  1.14 <0.10  LATICACIDVEN 1.7  --   --     Recent Results (from the past 240 hour(s))  Culture, blood (routine x 2)     Status: None   Collection Time: 09/23/19  2:49 PM   Specimen: BLOOD  Result Value Ref Range Status   Specimen Description   Final    BLOOD RIGHT ANTECUBITAL Performed at [facility], [place] [address], [place], [place] [address]    Special Requests   Final    BOTTLES DRAWN AEROBIC AND ANAEROBIC Blood Culture adequate volume Performed at [place] [address], [place], [place] [address]    Culture   Final    NO GROWTH 5 DAYS Performed at [facility], [place] [address] [place], [place] [address]    Report Status 09/28/2019 FINAL  Final  Culture, blood (routine x 2)     Status: None   Collection Time: 09/23/19  2:54 PM   Specimen: BLOOD  Result Value Ref Range Status   Specimen Description   Final    BLOOD LEFT ANTECUBITAL Performed at [facility], [place] [address], [place], [place] [address]    Special Requests   Final    BOTTLES DRAWN AEROBIC AND ANAEROBIC Blood Culture adequate volume Performed at [place] [address], [place], [place] [address]    Culture   Final    NO GROWTH 5 DAYS Performed  at [facility], [place] [address] [place], [place] [address]    Report Status 09/28/2019 FINAL  Final  MRSA PCR Screening     Status: None   Collection Time: 09/24/19  8:01 PM   Specimen: Nasal Mucosa; Nasopharyngeal  Result Value Ref Range Status   MRSA by  PCR NEGATIVE NEGATIVE Final    Comment:        The GeneXpert MRSA Assay (FDA approved for NASAL specimens only), is one component of a comprehensive MRSA colonization surveillance program. It is not intended to diagnose MRSA infection nor to guide or monitor treatment for MRSA infections. Performed at [facility], [place] [address] [place], [place] [address]       Radiology Studies: No results found.   Scheduled Meds: . amLODipine  10 mg Oral Daily  . vitamin C  500 mg Oral Daily  . aspirin EC  81 mg Oral Daily  . baclofen  20 mg Oral TID  . Chlorhexidine Gluconate Cloth  6 each Topical Daily  . cloNIDine  0.1 mg Oral BID  . dexamethasone (DECADRON) injection  6 mg Intravenous Q24H  . enoxaparin (LOVENOX) injection  1 mg/kg Subcutaneous Q12H  . finasteride  5 mg Oral Daily  . hydrochlorothiazide  12.5 mg Oral Daily  . insulin aspart  0-9 Units Subcutaneous Q4H  . insulin detemir  0.075 Units/kg Subcutaneous BID  . Ipratropium-Albuterol  1 puff Inhalation TID  . irbesartan  150 mg Oral Daily  . linagliptin  5 mg Oral Daily  . mouth rinse  15 mL Mouth Rinse BID  . multivitamin with minerals  1 tablet Oral Daily  . oxybutynin  5 mg Oral QHS  . pregabalin  75 mg Oral BID  . traZODone  50 mg Oral QHS  . zinc sulfate  220 mg Oral Daily   Continuous Infusions:   LOS: 6 days    Time spent: 35 min.    Shawna Clamp, MD [facility]   If 7PM-7AM, please contact night-coverage

## 2019-09-30 DIAGNOSIS — J9600 Acute respiratory failure, unspecified whether with hypoxia or hypercapnia: Secondary | ICD-10-CM | POA: Diagnosis not present

## 2019-09-30 DIAGNOSIS — U071 COVID-19: Secondary | ICD-10-CM | POA: Diagnosis not present

## 2019-09-30 LAB — CBC
HCT: 37.5 % — ABNORMAL LOW (ref 39.0–52.0)
Hemoglobin: 12.1 g/dL — ABNORMAL LOW (ref 13.0–17.0)
MCH: 31.3 pg (ref 26.0–34.0)
MCHC: 32.3 g/dL (ref 30.0–36.0)
MCV: 96.9 fL (ref 80.0–100.0)
Platelets: 447 10*3/uL — ABNORMAL HIGH (ref 150–400)
RBC: 3.87 MIL/uL — ABNORMAL LOW (ref 4.22–5.81)
RDW: 14.5 % (ref 11.5–15.5)
WBC: 8 10*3/uL (ref 4.0–10.5)
nRBC: 1 % — ABNORMAL HIGH (ref 0.0–0.2)

## 2019-09-30 LAB — COMPREHENSIVE METABOLIC PANEL
ALT: 48 U/L — ABNORMAL HIGH (ref 0–44)
AST: 73 U/L — ABNORMAL HIGH (ref 15–41)
Albumin: 2.9 g/dL — ABNORMAL LOW (ref 3.5–5.0)
Alkaline Phosphatase: 61 U/L (ref 38–126)
Anion gap: 10 (ref 5–15)
BUN: 32 mg/dL — ABNORMAL HIGH (ref 8–23)
CO2: 25 mmol/L (ref 22–32)
Calcium: 8.9 mg/dL (ref 8.9–10.3)
Chloride: 103 mmol/L (ref 98–111)
Creatinine, Ser: 0.88 mg/dL (ref 0.61–1.24)
GFR calc Af Amer: >60 mL/min (ref >60)
GFR calc non Af Amer: >60 mL/min (ref >60)
Glucose, Bld: 144 mg/dL — ABNORMAL HIGH (ref 70–99)
Potassium: 4.6 mmol/L (ref 3.5–5.1)
Sodium: 138 mmol/L (ref 135–145)
Total Bilirubin: 0.7 mg/dL (ref 0.3–1.2)
Total Protein: 5.4 g/dL — ABNORMAL LOW (ref 6.5–8.1)

## 2019-09-30 LAB — GLUCOSE, CAPILLARY
Glucose-Capillary: 148 mg/dL — ABNORMAL HIGH (ref 70–99)
Glucose-Capillary: 136 mg/dL — ABNORMAL HIGH (ref 70–99)
Glucose-Capillary: 112 mg/dL — ABNORMAL HIGH (ref 70–99)
Glucose-Capillary: 188 mg/dL — ABNORMAL HIGH (ref 70–99)
Glucose-Capillary: 143 mg/dL — ABNORMAL HIGH (ref 70–99)

## 2019-09-30 LAB — MAGNESIUM: Magnesium: 1.8 mg/dL (ref 1.7–2.4)

## 2019-09-30 LAB — PHOSPHORUS: Phosphorus: 4.1 mg/dL (ref 2.5–4.6)

## 2019-09-30 MED ORDER — APIXABAN (ELIQUIS) EDUCATION KIT FOR DVT/PE PATIENTS
PACK | Freq: Once | Status: AC
  Filled 2019-09-30: qty 1

## 2019-09-30 MED ORDER — DOCUSATE SODIUM 100 MG PO CAPS
100.0000 mg | ORAL_CAPSULE | Freq: Three times a day (TID) | ORAL | Status: DC
  Administered 2019-09-30 – 2019-10-04 (×14): 100 mg via ORAL
  Filled 2019-09-30 (×14): qty 1

## 2019-09-30 MED ORDER — APIXABAN 5 MG PO TABS: 5.0000 mg | ORAL_TABLET | Freq: Two times a day (BID) | ORAL | Status: DC

## 2019-09-30 MED ORDER — APIXABAN 5 MG PO TABS
10.0000 mg | ORAL_TABLET | Freq: Two times a day (BID) | ORAL | Status: DC
  Administered 2019-09-30 – 2019-10-04 (×9): 10 mg via ORAL
  Filled 2019-09-30: qty 2
  Filled 2019-09-30: qty 4
  Filled 2019-09-30: qty 2
  Filled 2019-09-30 (×2): qty 4
  Filled 2019-09-30 (×4): qty 2

## 2019-09-30 MED ORDER — SENNA 8.6 MG PO TABS
2.0000 | ORAL_TABLET | Freq: Once | ORAL | Status: AC
  Administered 2019-09-30: 11:00:00 17.2 mg via ORAL
  Filled 2019-09-30: qty 2

## 2019-09-30 NOTE — Progress Notes (Signed)
ANTICOAGULATION CONSULT NOTE - Follow Up Consult  Pharmacy Consult for apixaban Indication: pulmonary embolus  Allergies  Allergen Reactions  . No Known Allergies     Patient Measurements: Height: 5\' 7"  (170.2 cm) Weight: 72 kg (158 lb 11.7 oz) IBW/kg (Calculated) : 66.1  Vital Signs: Temp: 98.6 F (37 C) (05/01 0800) Temp Source: Oral (05/01 0800) BP: 109/59 (05/01 0700) Pulse Rate: 54 (05/01 0700)  Labs: Recent Labs    09/28/19 0234 09/30/19 0224  HGB 11.7* 12.1*  HCT 37.2* 37.5*  PLT 382 447*  CREATININE 1.04 0.88    Estimated Creatinine Clearance: 72 mL/min (by C-G formula based on SCr of 0.88 mg/dL).  Assessment: 29 y/oM admitted with COVID-19 pneumonia. D-dimer > 20. CTa chest today: Findings suspicious for pulmonary emboli involving the segmental and subsegmental branches of the left lower lobe, right middle lobe and lingula. No evidence of right heart strain. Pharmacy was initially consulted for Enoxaparin dosing, now consulted to transition to apixaban.  Today, 09/30/2019  SCr 0.88 with CrCl ~ 72 ml/min.   CBC stable: Hgb 12.1, Pltc elevated  No bleeding reported  Goal of Therapy:  Anti-Xa level 0.6-1 units/ml 4hrs after LMWH dose given Monitor platelets by anticoagulation protocol: Yes   Plan:  D/C Enoxaparin Apixaban 10mg  PO BID x7 days, followed by 5 mg PO BID. Pharmacy to provide education and 30 day trial card prior to discharge.  Gretta Arab PharmD, BCPS Clinical Pharmacist [facility] main pharmacy [phone] 09/30/2019 9:08 AM

## 2019-09-30 NOTE — Discharge Instructions (Signed)
Information on my medicine - ELIQUIS (apixaban) Why was Eliquis prescribed for you? Eliquis was prescribed to treat blood clots that may have been found in the veins of your legs (deep vein thrombosis) or in your lungs (pulmonary embolism) and to reduce the risk of them occurring again.  What do You need to know about Eliquis ? The starting dose is 10 mg (two 5 mg tablets) taken TWICE daily for the FIRST SEVEN (7) DAYS, then on (enter date)  10/07/19  the dose is reduced to ONE 5 mg tablet taken TWICE daily.  Eliquis may be taken with or without food.   Try to take the dose about the same time in the morning and in the evening. If you have difficulty swallowing the tablet whole please discuss with your pharmacist how to take the medication safely.  Take Eliquis exactly as prescribed and DO NOT stop taking Eliquis without talking to the doctor who prescribed the medication.  Stopping may increase your risk of developing a new blood clot.  Refill your prescription before you run out.  After discharge, you should have regular check-up appointments with your healthcare provider that is prescribing your Eliquis.    What do you do if you miss a dose? If a dose of ELIQUIS is not taken at the scheduled time, take it as soon as possible on the same day and twice-daily administration should be resumed. The dose should not be doubled to make up for a missed dose.  Important Safety Information A possible side effect of Eliquis is bleeding. You should call your healthcare provider right away if you experience any of the following: ? Bleeding from an injury or your nose that does not stop. ? Unusual colored urine (red or dark brown) or unusual colored stools (red or black). ? Unusual bruising for unknown reasons. ? A serious fall or if you hit your head (even if there is no bleeding).  Some medicines may interact with Eliquis and might increase your risk of bleeding or clotting while on Eliquis.  To help avoid this, consult your healthcare provider or pharmacist prior to using any new prescription or non-prescription medications, including herbals, vitamins, non-steroidal anti-inflammatory drugs (NSAIDs) and supplements.  This website has more information on Eliquis (apixaban): http://[email or web address].com/eliquis/home

## 2019-09-30 NOTE — Evaluation (Signed)
Occupational Therapy Evaluation Patient Details Name: Larry [name] MRN: [id number] DOB: 21-Jan-1951 Today's Date: 09/30/2019    History of Present Illness As per chart: 69 y.o. male with medical history significant of multiple sclerosis, T 10 spinal cord injury currently mod I with wheelchair, diabetes, hyperlipidemia, carpal tunnel syndrome, kidney stones, hypertension who came from the home  On 09/23/19 with shortness of breath, wheezing as well as cough.he was exposed to his niece who was diagnosed with COVID-19 about 2 weeks PTA.   Clinical Impression   Pt admitted with the above. Pt currently with functional limitations due to the deficits listed below (see OT Problem List).  Pt will benefit from skilled OT to increase their safety and independence with ADL and functional mobility for ADL to facilitate discharge to venue listed below.   Pts sister plans to bring [facility] to [facility] for pt pt to get with therapy.    Follow Up Recommendations  Home health OT;Supervision/Assistance - 24 hour    Equipment Recommendations  None recommended by OT    Recommendations for Other Services       Precautions / Restrictions Precautions Precautions: Fall Precaution Comments: droplet      Mobility Bed Mobility Overal bed mobility: Needs Assistance Bed Mobility: Supine to Sit;Sit to Supine     Supine to sit: Min assist Sit to supine: Min assist      Transfers                 General transfer comment: Sister going to bring [facility]    Balance Overall balance assessment: Needs assistance Sitting-balance support: Feet unsupported;No upper extremity supported Sitting balance-Leahy Scale: Fair Sitting balance - Comments: can sit without UE support       Standing balance comment: nt                           ADL either performed or assessed with clinical judgement   ADL Overall ADL's : Needs assistance/impaired Eating/Feeding: Set up;Bed level Eating/Feeding Details  (indicate cue type and reason): needs set up as this environment is different than home. Grooming: Wash/dry hands;Wash/dry face;Minimal assistance;Bed level                                 General ADL Comments: Wife bringing WC today or next day. Ptdid not want to try getting in recliner or hospital WC with OT.     Vision Patient Visual Report: No change from baseline              Pertinent Vitals/Pain Pain Assessment: No/denies pain     Hand Dominance     Extremity/Trunk Assessment Upper Extremity Assessment Upper Extremity Assessment: Generalized weakness(pt overall with weakness in BUE but able to self feed, obtain cup, reach for items on tray, etc)           Communication Communication Communication: No difficulties   Cognition Arousal/Alertness: Awake/alert Behavior During Therapy: WFL for tasks assessed/performed Overall Cognitive Status: Within Functional Limits for tasks assessed                                                Home Living Family/patient expects to be discharged to:: Private residence Living Arrangements: Alone Available Help at Discharge: Friend(s);Available PRN/intermittently Type  of Home: House Home Access: Level entry     Home Layout: One level     Bathroom Shower/Tub: Teacher, early years/pre: Standard     Home Equipment: Wheelchair - manual;Tub bench   Additional Comments: neighbor gets mail      Prior Functioning/Environment Level of Independence: Independent with assistive device(s)  Gait / Transfers Assistance Needed: transfers to [facility]  , to Toilet, to bed mod I. uses lyft and transfes to car     Comments: has someone cook meals and he is able to fix and eat them.        OT Problem List: Decreased strength;Decreased activity tolerance;Impaired balance (sitting and/or standing)         OT Goals(Current goals can be found in the care plan section) Acute Rehab OT Goals Patient  Stated Goal: go home OT Goal Formulation: With patient Time For Goal Achievement: 10/07/19 Potential to Achieve Goals: Good  OT Frequency: Min 2X/week   Barriers to D/C: Decreased caregiver support             AM-PAC OT "6 Clicks" Daily Activity     Outcome Measure Help from another person eating meals?: A Little Help from another person taking care of personal grooming?: A Little Help from another person toileting, which includes using toliet, bedpan, or urinal?: A Lot Help from another person bathing (including washing, rinsing, drying)?: A Lot Help from another person to put on and taking off regular upper body clothing?: A Little Help from another person to put on and taking off regular lower body clothing?: A Lot 6 Click Score: 15   End of Session Nurse Communication: Mobility status  Activity Tolerance: Patient tolerated treatment well Patient left: in bed;with bed alarm set  OT Visit Diagnosis: Unsteadiness on feet (R26.81);Other abnormalities of gait and mobility (R26.89);Muscle weakness (generalized) (M62.81)                Time: [id number] OT Time Calculation (min): 22 min Charges:  OT General Charges $OT Visit: 1 Visit OT Evaluation $OT Eval Moderate Complexity: 1 Mod  Kari Baars, OT Acute Rehabilitation Services [phone] Office- [phone], Edwena Felty D 09/30/2019, 5:40 PM

## 2019-09-30 NOTE — Progress Notes (Signed)
PROGRESS NOTE    Larry [name]  [id number] DOB: 03-01-1951 DOA: 09/23/2019 PCP: Flossie Buffy, NP   Brief Narrative:  As per chart: 69 y.o.malewith medical history significant ofmultiple sclerosis, spinal cord injury currently on wheelchair, diabetes, hyperlipidemia, carpal tunnel syndrome, kidney stones, hypertension who came from the home  On 09/23/19 with shortness of breath, wheezing as well as cough. He was exposed to his niece who was diagnosed with COVID-19 about 2 weeks PTA. He has some low-grade fever but no chills. Denied any nausea vomiting or abdominal pain.   In ED, Patient's oxygen sats were 58% on arrival by EMS. He was also 78% on arrival in the ER. In Ed He  was on oxygen by nonrebreather bag with oxygen sat roughly around 90%. LDH is 467 troponin I 25. Ferritin 217 CRP of 12.5 procalcitonin 1.14 D-dimer is more than 20 fibrinogen 581. COVID-19 screen is positive. Chest x-ray shows bilateral lower lung airspace disease and consolidation. CBC and CMP are largely within normal. Patient was admitted for further management. CT chest : Demonstrated findings suspicious for pulmonary emboli involving the segmental and subsegmental branches of the left lower lobe right middle lobe and lingular lobe no evidence of right heart strain.   Pt clinically improving. O2 saturation 96% on RA. 4/28-completed remdesivir, and antibiotics, procalcitonin WNL, CRP down to 1.6 1/5:  Lovenox switched to Eliquis.   Assessment & Plan:   Principal Problem:   Acute respiratory failure due to COVID-19 [facility]) Active Problems:   Paraplegia (HCC)   DM (diabetes mellitus) ([place])   MS (multiple sclerosis) ([place])   Hypertension   Overactive bladder   History of spinal cord injury   GERD (gastroesophageal reflux disease)   Benign prostatic hyperplasia with lower urinary tract symptoms   Pressure injury of skin  Acute respiratory failure due to COVID-19  Pneumonia/COVID-19 pneumonia: CT  is showing patchy groundglass opacities throughout both lungs with mid lower zones predominant.  Received Actemra. Completed remdesivir 5 doses and 5 days of ceftriaxone /azithromycin-procalcitonin WNL. Also on linagliptin for DM for mortality benefits. Improving inflammatory markers. 4/28 Switched from 4L HFNC to 4L nasal cannula. Today to 2l [place]. Continue steroids, supplemental nasal oxygen and wean off , encourage increase in activity w/ PT today.  COVID-19 Labs: Inflammatory markers are much improved.  Recent Labs    09/28/19 0234  DDIMER 12.46*  FERRITIN 121  CRP 1.1*    No results found for: SARSCOV2NAA  Multilobar pulmonary emboli: Demonstrated findings suspicious for pulmonary emboli involving the segmental and subsegmental branches of the left lower lobe right middle lobe and lingular lobe no evidence of right heart strain.  Patient is on therapeutic Lovenox-will switch to DOAC- discussed w/ pt pharmacy consulted.  Transaminitis- likely from Covid, iAST  45>52. monitor  AKI: Improved. creat peaked from 1.6> 2.39, now improved to 0.8. bun 59>37  Hypertension: Blood pressure well controlled, on amlodipine, clonidine, HCTZ, irbesartan  Anemia of chronic disease;  Hemoglobin is stable in 11 to 12 g range.  Elevated troponin in the setting of hypoxia 155>130>127, flat and downtrending.  Suspect due to hypoxia/PE and Covid infection.  Paraplegia/T10 compression fracture/overactive bladder/ MS: Continue supportive care, PT eval  GERD: Continue PPI  BPH: He has a condom catheter.  DM: type II stable hemoglobin A1c at 5.9 October/2020, continue sliding scale insulin, lantus, linagliptin   Code Status: full Family Communication: plan of care discussed with patient at bedside.  Status : Inpatient Remains inpatient appropriate because:Inpatient level of  care appropriate due to severity of illness and Due to ongoing acute hypoxic respiratory failure due to PE, Covid pneumonia.    Patient understood the need, ordered transfer to [place] with telemetry. Anticipated  discharge tomorrow. will observe on Eliquis today.  Dispo: The patient is from: Home  Anticipated d/c is to: Home Anticipated d/c date in: 1 day   Consultants:      Procedures:   Antimicrobials:  Anti-infectives (From admission, onward)   Start     Dose/Rate Route Frequency Ordered Stop   09/24/19 1000  remdesivir 100 mg in sodium chloride 0.9 % 100 mL IVPB  Status:  Discontinued     100 mg 200 mL/hr over 30 Minutes Intravenous Daily 09/23/19 2149 09/23/19 2200   09/24/19 1000  remdesivir 100 mg in sodium chloride 0.9 % 100 mL IVPB     100 mg 200 mL/hr over 30 Minutes Intravenous Daily 09/23/19 2021 09/27/19 1041   09/23/19 2200  cefTRIAXone (ROCEPHIN) 1 g in sodium chloride 0.9 % 100 mL IVPB  Status:  Discontinued     1 g 200 mL/hr over 30 Minutes Intravenous Every 24 hours 09/23/19 2149 09/28/19 0856   09/23/19 2200  azithromycin (ZITHROMAX) 500 mg in sodium chloride 0.9 % 250 mL IVPB  Status:  Discontinued     500 mg 250 mL/hr over 60 Minutes Intravenous Every 24 hours 09/23/19 2149 09/28/19 0856   09/23/19 2148  remdesivir 200 mg in sodium chloride 0.9% 250 mL IVPB  Status:  Discontinued     200 mg 580 mL/hr over 30 Minutes Intravenous Once 09/23/19 2149 09/23/19 2200   09/23/19 2100  remdesivir 100 mg in sodium chloride 0.9 % 100 mL IVPB     100 mg 200 mL/hr over 30 Minutes Intravenous Every 30 min 09/23/19 2021 09/24/19 0240      Subjective: Patient was seen and examined at bedside.  No overnight events.  Patient was on 2 L supplemental oxygen saturating 96%.  Normally in wheelchair able to move upper extremities but not the lower extremities.  Objective: Vitals:   09/30/19 0500 09/30/19 0600 09/30/19 0700 09/30/19 0800  BP: (!) 91/57 (!) 105/59 (!) 109/59   Pulse: (!) 56 (!) 55 (!) 54   Resp:      Temp: 98.1 F (36.7 C)   98.6 F (37 C)  TempSrc: Oral   Oral  SpO2: 96%  97% 96%   Weight:      Height:        Intake/Output Summary (Last 24 hours) at 09/30/2019 1104 Last data filed at 09/30/2019 0600 Gross per 24 hour  Intake --  Output 1975 ml  Net -1975 ml   Filed Weights   09/24/19 0551 09/24/19 1900 09/27/19 0400  Weight: 74.8 kg 69.8 kg 72 kg    Examination:  General exam: Appears calm and comfortable  Respiratory system: Clear to auscultation. Respiratory effort normal. Cardiovascular system: S1 & S2 heard, RRR. No JVD, murmurs, rubs, gallops or clicks. No pedal edema. Gastrointestinal system: Abdomen is nondistended, soft and nontender. No organomegaly or masses felt. Normal bowel sounds heard. Central nervous system: Alert and oriented. No focal neurological deficits. Extremities: moving upper extremities and unable to move lower extremities but sensation intact.   Skin: No rashes, lesions or ulcers Psychiatry: Judgement and insight appear normal. Mood & affect appropriate.     Data Reviewed: I have personally reviewed following labs and imaging studies  CBC: Recent Labs  Lab 09/24/19 0546 09/24/19 0546 09/25/19 0234  09/26/19 0227 09/27/19 0212 09/28/19 0234 09/30/19 0224  WBC 6.0   < > 5.9 8.2 8.4 7.9 8.0  NEUTROABS 5.2  --  4.8 6.6 6.7 6.5  --   HGB 12.2*   < > 11.4* 11.8* 11.9* 11.7* 12.1*  HCT 37.0*   < > 35.6* 36.4* 37.2* 37.2* 37.5*  MCV 94.9   < > 96.5 96.0 97.6 97.4 96.9  PLT 342   < > 292 376 384 382 447*   < > = values in this interval not displayed.   Basic Metabolic Panel: Recent Labs  Lab 09/25/19 0234 09/26/19 0227 09/27/19 0212 09/28/19 0234 09/30/19 0224  NA 142 140 140 138 138  K 4.2 3.9 4.3 4.7 4.6  CL 106 106 107 105 103  CO2 23 24 22 22 25   GLUCOSE 148* 163* 150* 182* 144*  BUN 46* 38* 37* 37* 32*  CREATININE 1.13 0.94 0.87 1.04 0.88  CALCIUM 8.7* 8.6* 8.5* 8.6* 8.9  MG  --   --   --   --  1.8  PHOS  --   --   --   --  4.1   GFR: Estimated Creatinine Clearance: 72 mL/min (by C-G formula based  on SCr of 0.88 mg/dL). Liver Function Tests: Recent Labs  Lab 09/25/19 0234 09/26/19 0227 09/27/19 0212 09/28/19 0234 09/30/19 0224  AST 54* 35 44* 52* 73*  ALT 21 20 23 27  48*  ALKPHOS 75 88 76 71 61  BILITOT 0.6 0.5 0.8 0.5 0.7  PROT 6.1* 6.0* 5.7* 5.3* 5.4*  ALBUMIN 2.8* 2.8* 2.7* 2.8* 2.9*   No results for input(s): LIPASE, AMYLASE in the last 168 hours. No results for input(s): AMMONIA in the last 168 hours. Coagulation Profile: No results for input(s): INR, PROTIME in the last 168 hours. Cardiac Enzymes: No results for input(s): CKTOTAL, CKMB, CKMBINDEX, TROPONINI in the last 168 hours. BNP (last 3 results) Recent Labs    03/24/19 1555  PROBNP 33.0   HbA1C: No results for input(s): HGBA1C in the last 72 hours. CBG: Recent Labs  Lab 09/29/19 1540 09/29/19 2056 09/29/19 2346 09/30/19 0518 09/30/19 0817  GLUCAP 100* 138* 107* 148* 136*   Lipid Profile: No results for input(s): CHOL, HDL, LDLCALC, TRIG, CHOLHDL, LDLDIRECT in the last 72 hours. Thyroid Function Tests: No results for input(s): TSH, T4TOTAL, FREET4, T3FREE, THYROIDAB in the last 72 hours. Anemia Panel: Recent Labs    09/28/19 0234  FERRITIN 121   Sepsis Labs: Recent Labs  Lab 09/23/19 1449 09/23/19 2149 09/27/19 1357  PROCALCITON  --  1.14 <0.10  LATICACIDVEN 1.7  --   --     Recent Results (from the past 240 hour(s))  Culture, blood (routine x 2)     Status: None   Collection Time: 09/23/19  2:49 PM   Specimen: BLOOD  Result Value Ref Range Status   Specimen Description   Final    BLOOD RIGHT ANTECUBITAL Performed at [facility], [place] [address] [place], [place] [address]    Special Requests   Final    BOTTLES DRAWN AEROBIC AND ANAEROBIC Blood Culture adequate volume Performed at [place] [address] [place], [place] [address]    Culture   Final    NO GROWTH 5 DAYS Performed at [facility], [place] [address], [place], [place] [address]     Report Status 09/28/2019 FINAL  Final  Culture, blood (routine x 2)     Status: None  Collection Time: 09/23/19  2:54 PM   Specimen: BLOOD  Result Value Ref Range Status   Specimen Description   Final    BLOOD LEFT ANTECUBITAL Performed at [facility], [place] [address] [place], [place] [address]    Special Requests   Final    BOTTLES DRAWN AEROBIC AND ANAEROBIC Blood Culture adequate volume Performed at [place] [address] [place], [place] [address]    Culture   Final    NO GROWTH 5 DAYS Performed at [facility], [place] [address], [place], [place] [address]    Report Status 09/28/2019 FINAL  Final  MRSA PCR Screening     Status: None   Collection Time: 09/24/19  8:01 PM   Specimen: Nasal Mucosa; Nasopharyngeal  Result Value Ref Range Status   MRSA by PCR NEGATIVE NEGATIVE Final    Comment:        The GeneXpert MRSA Assay (FDA approved for NASAL specimens only), is one component of a comprehensive MRSA colonization surveillance program. It is not intended to diagnose MRSA infection nor to guide or monitor treatment for MRSA infections. Performed at [facility], [place] [address], [place], [place] [address]       Radiology Studies: No results found.   Scheduled Meds: . amLODipine  10 mg Oral Daily  . apixaban  10 mg Oral BID   Followed by  . [START ON 10/07/2019] apixaban  5 mg Oral BID  . vitamin C  500 mg Oral Daily  . baclofen  20 mg Oral TID  . Chlorhexidine Gluconate Cloth  6 each Topical Daily  . cloNIDine  0.1 mg Oral BID  . dexamethasone (DECADRON) injection  6 mg Intravenous Q24H  . docusate sodium  100 mg Oral TID  . finasteride  5 mg Oral Daily  . hydrochlorothiazide  12.5 mg Oral Daily  . insulin aspart  0-9 Units Subcutaneous Q4H  . insulin detemir  0.075 Units/kg Subcutaneous BID  . irbesartan  150 mg Oral Daily  . linagliptin  5 mg Oral Daily  . mouth rinse  15 mL Mouth Rinse BID  .  multivitamin with minerals  1 tablet Oral Daily  . oxybutynin  5 mg Oral QHS  . pregabalin  75 mg Oral BID  . traZODone  50 mg Oral QHS  . zinc sulfate  220 mg Oral Daily   Continuous Infusions:   LOS: 7 days    Time spent: 25 min.    Shawna Clamp, MD [facility]   If 7PM-7AM, please contact night-coverage

## 2019-10-01 DIAGNOSIS — E119 Type 2 diabetes mellitus without complications: Secondary | ICD-10-CM

## 2019-10-01 DIAGNOSIS — Z794 Long term (current) use of insulin: Secondary | ICD-10-CM

## 2019-10-01 DIAGNOSIS — J1282 Pneumonia due to coronavirus disease 2019: Secondary | ICD-10-CM

## 2019-10-01 LAB — GLUCOSE, CAPILLARY
Glucose-Capillary: 119 mg/dL — ABNORMAL HIGH (ref 70–99)
Glucose-Capillary: 128 mg/dL — ABNORMAL HIGH (ref 70–99)
Glucose-Capillary: 128 mg/dL — ABNORMAL HIGH (ref 70–99)
Glucose-Capillary: 135 mg/dL — ABNORMAL HIGH (ref 70–99)
Glucose-Capillary: 164 mg/dL — ABNORMAL HIGH (ref 70–99)
Glucose-Capillary: 190 mg/dL — ABNORMAL HIGH (ref 70–99)

## 2019-10-01 MED ORDER — BENZONATATE 100 MG PO CAPS
100.0000 mg | ORAL_CAPSULE | Freq: Four times a day (QID) | ORAL | 0 refills | Status: DC | PRN
Start: 2019-10-01 — End: 2020-01-24

## 2019-10-01 MED ORDER — AMLODIPINE BESYLATE 10 MG PO TABS: 10.0000 mg | ORAL_TABLET | Freq: Every day | ORAL | 1 refills | Status: DC

## 2019-10-01 MED ORDER — IPRATROPIUM-ALBUTEROL 20-100 MCG/ACT IN AERS: 1.0000 | INHALATION_SPRAY | Freq: Four times a day (QID) | RESPIRATORY_TRACT | 1 refills | Status: DC | PRN

## 2019-10-01 MED ORDER — APIXABAN 5 MG PO TABS: 5.0000 mg | ORAL_TABLET | Freq: Two times a day (BID) | ORAL | 1 refills | Status: DC

## 2019-10-01 MED ORDER — LINAGLIPTIN 5 MG PO TABS: 5.0000 mg | ORAL_TABLET | Freq: Every day | ORAL | 1 refills | Status: DC

## 2019-10-01 MED ORDER — APIXABAN 5 MG PO TABS: 10.0000 mg | ORAL_TABLET | Freq: Two times a day (BID) | ORAL | 0 refills | Status: DC

## 2019-10-01 NOTE — Progress Notes (Signed)
Patient ID: Larry [name], male   DOB: 05-13-1951, 69 y.o.   MRN: [id number]  PROGRESS NOTE    Larry [name]  [id number] DOB: [birth year] DOA: 09/23/2019 PCP: Flossie Buffy, NP    Brief Narrative:  As per chart: 69 y.o. male with medical history significant of multiple sclerosis, spinal cord injury currently on wheelchair, diabetes, hyperlipidemia, carpal tunnel syndrome, kidney stones, hypertension who came from the home  On 09/23/19 with shortness of breath, wheezing as well as cough. He was exposed to his niece who was diagnosed with COVID-19 about 2 weeks PTA. He has some low-grade fever but no chills.  Denied any nausea vomiting or abdominal pain.     In ED, Patient's oxygen sats were 58% on arrival by EMS.  He was also 78% on arrival in the ER. In Ed He  was on oxygen by nonrebreather bag with oxygen sat roughly around 90%. LDH is 467 troponin I 25.  Ferritin 217 CRP of 12.5 procalcitonin 1.14 D-dimer is more than 20 fibrinogen 581.  COVID-19 screen is positive.  Chest x-ray shows bilateral lower lung airspace disease and consolidation.  CBC and CMP are largely within normal.  Patient was admitted for further management. CT chest : Demonstrated findings suspicious for pulmonary emboli involving the segmental and subsegmental branches of the left lower lobe right middle lobe and lingular lobe no evidence of right heart strain.   Pt clinically improving. O2 saturation 96% on RA. 4/28-completed remdesivir, and antibiotics, procalcitonin WNL, CRP down to 1.6 5/1:  Lovenox switched to Eliquis.   Assessment & Plan:   Principal Problem:   Acute respiratory failure due to COVID-19 [facility]) Active Problems:   Paraplegia (HCC)   DM (diabetes mellitus) ([place])   MS (multiple sclerosis) ([place])   Hypertension   Overactive bladder   History of spinal cord injury   GERD (gastroesophageal reflux disease)   Benign prostatic hyperplasia with lower urinary tract symptoms   Pressure injury of skin   Acute respiratory failure due to COVID-19  Pneumonia/COVID-19 pneumonia: CT is showing patchy groundglass opacities throughout both lungs with mid lower zones predominant.  Received Actemra. Completed remdesivir 5 doses and 5 days of ceftriaxone /azithromycin-procalcitonin WNL. Also on linagliptin for DM for mortality benefits. Improving inflammatory markers. 4/28 Switched from 4L HFNC to 4L nasal cannula. Today to 2l [place]. Continue steroids, supplemental nasal oxygen and wean off , encourage increase in activity w/ PT today.   COVID-19 Labs: Inflammatory markers are much improved.   Recent Labs (last 2 labs)      Recent Labs    09/28/19 0234  DDIMER 12.46*  FERRITIN 121  CRP 1.1*        Recent Labs  No results found for: SARSCOV2NAA     Multilobar pulmonary emboli: Demonstrated findings suspicious for pulmonary emboli involving the segmental and subsegmental branches of the left lower lobe right middle lobe and lingular lobe no evidence of right heart strain.  Patient is on therapeutic Lovenox-will switch to DOAC- discussed w/ pt pharmacy consulted.   Transaminitis- likely from Covid, iAST  45>52. monitor   AKI: Improved. creat peaked from 1.6> 2.39, now improved to 0.8. bun 59>37   Hypertension: Blood pressure well controlled, on amlodipine, clonidine, HCTZ, irbesartan   Anemia of chronic disease;  Hemoglobin is stable in 11 to 12 g range.   Elevated troponin in the setting of hypoxia 155>130>127, flat and downtrending.  Suspect due to hypoxia/PE and Covid infection.   Paraplegia/T10 compression fracture/overactive bladder/ MS:  Continue supportive care, PT eval   GERD: Continue PPI   BPH: He has a condom catheter.   DM: type II stable hemoglobin A1c at 5.9 October/2020, continue sliding scale insulin, lantus, linagliptin    DVT prophylaxis: Lovenox SQ Code Status: Full code  Family Communication: Daughter by phone Disposition Plan: ? Rehab--Unable to independently  transfer   Consultants:  PT/OT  Procedures: None  Antimicrobials: Anti-infectives (From admission, onward)    Start     Dose/Rate Route Frequency Ordered Stop   09/24/19 1000  remdesivir 100 mg in sodium chloride 0.9 % 100 mL IVPB  Status:  Discontinued     100 mg 200 mL/hr over 30 Minutes Intravenous Daily 09/23/19 2149 09/23/19 2200   09/24/19 1000  remdesivir 100 mg in sodium chloride 0.9 % 100 mL IVPB     100 mg 200 mL/hr over 30 Minutes Intravenous Daily 09/23/19 2021 09/27/19 1041   09/23/19 2200  cefTRIAXone (ROCEPHIN) 1 g in sodium chloride 0.9 % 100 mL IVPB  Status:  Discontinued     1 g 200 mL/hr over 30 Minutes Intravenous Every 24 hours 09/23/19 2149 09/28/19 0856   09/23/19 2200  azithromycin (ZITHROMAX) 500 mg in sodium chloride 0.9 % 250 mL IVPB  Status:  Discontinued     500 mg 250 mL/hr over 60 Minutes Intravenous Every 24 hours 09/23/19 2149 09/28/19 0856   09/23/19 2148  remdesivir 200 mg in sodium chloride 0.9% 250 mL IVPB  Status:  Discontinued     200 mg 580 mL/hr over 30 Minutes Intravenous Once 09/23/19 2149 09/23/19 2200   09/23/19 2100  remdesivir 100 mg in sodium chloride 0.9 % 100 mL IVPB     100 mg 200 mL/hr over 30 Minutes Intravenous Every 30 min 09/23/19 2021 09/24/19 0240        Subjective: Feels good today--wants to go home. He lives alone.  Objective: Vitals:   10/01/19 0400 10/01/19 0500 10/01/19 0600 10/01/19 0900  BP: (!) 96/51 (!) 87/51 (!) 112/59   Pulse:  (!) 45 (!) 53   Resp: 10 11 11    Temp: 98.4 F (36.9 C)   98.3 F (36.8 C)  TempSrc: Oral   Axillary  SpO2:  95% 97%   Weight:      Height:        Intake/Output Summary (Last 24 hours) at 10/01/2019 1351 Last data filed at 10/01/2019 0345 Gross per 24 hour  Intake   Output 925 ml  Net -925 ml   Filed Weights   09/24/19 0551 09/24/19 1900 09/27/19 0400  Weight: 74.8 kg 69.8 kg 72 kg    Examination:  General exam: Appears calm and comfortable  Respiratory system:  Clear to auscultation. Respiratory effort normal. Cardiovascular system: S1 & S2 heard, RRR.  Gastrointestinal system: Abdomen is nondistended, soft and nontender.  Central nervous system: Alert and oriented. No focal neurological deficits. Extremities: Symmetric  Skin: No rashes Psychiatry: Judgement and insight appear normal. Mood & affect appropriate.     Data Reviewed: I have personally reviewed following labs and imaging studies  CBC: Recent Labs  Lab 09/25/19 0234 09/26/19 0227 09/27/19 0212 09/28/19 0234 09/30/19 0224  WBC 5.9 8.2 8.4 7.9 8.0  NEUTROABS 4.8 6.6 6.7 6.5  --   HGB 11.4* 11.8* 11.9* 11.7* 12.1*  HCT 35.6* 36.4* 37.2* 37.2* 37.5*  MCV 96.5 96.0 97.6 97.4 96.9  PLT 292 376 384 382 [id number]*   Basic Metabolic Panel: Recent Labs  Lab 09/25/19 0234 09/26/19  [id number] 09/27/19 0212 09/28/19 0234 09/30/19 0224  NA 142 140 140 138 138  K 4.2 3.9 4.3 4.7 4.6  CL 106 106 107 105 103  CO2 23 24 22 22 25   GLUCOSE 148* 163* 150* 182* 144*  BUN 46* 38* 37* 37* 32*  CREATININE 1.13 0.94 0.87 1.04 0.88  CALCIUM 8.7* 8.6* 8.5* 8.6* 8.9  MG  --   --   --   --  1.8  PHOS  --   --   --   --  4.1   GFR: Estimated Creatinine Clearance: 72 mL/min (by C-G formula based on SCr of 0.88 mg/dL). Liver Function Tests: Recent Labs  Lab 09/25/19 0234 09/26/19 0227 09/27/19 0212 09/28/19 0234 09/30/19 0224  AST 54* 35 44* 52* 73*  ALT 21 20 23 27  48*  ALKPHOS 75 88 76 71 61  BILITOT 0.6 0.5 0.8 0.5 0.7  PROT 6.1* 6.0* 5.7* 5.3* 5.4*  ALBUMIN 2.8* 2.8* 2.7* 2.8* 2.9*   No results for input(s): LIPASE, AMYLASE in the last 168 hours. No results for input(s): AMMONIA in the last 168 hours. Coagulation Profile: No results for input(s): INR, PROTIME in the last 168 hours. Cardiac Enzymes: No results for input(s): CKTOTAL, CKMB, CKMBINDEX, TROPONINI in the last 168 hours. BNP (last 3 results) Recent Labs    03/24/19 1555  PROBNP 33.0   HbA1C: No results for input(s):  HGBA1C in the last 72 hours. CBG: Recent Labs  Lab 09/30/19 2007 10/01/19 0008 10/01/19 0343 10/01/19 0828 10/01/19 1112  GLUCAP 143* 119* 190* 128* 164*   Lipid Profile: No results for input(s): CHOL, HDL, LDLCALC, TRIG, CHOLHDL, LDLDIRECT in the last 72 hours. Thyroid Function Tests: No results for input(s): TSH, T4TOTAL, FREET4, T3FREE, THYROIDAB in the last 72 hours. Anemia Panel: No results for input(s): VITAMINB12, FOLATE, FERRITIN, TIBC, IRON, RETICCTPCT in the last 72 hours. Sepsis Labs: Recent Labs  Lab 09/27/19 1357  PROCALCITON <0.10    Recent Results (from the past 240 hour(s))  Culture, blood (routine x 2)     Status: None   Collection Time: 09/23/19  2:49 PM   Specimen: BLOOD  Result Value Ref [place] Status   Specimen Description   Final    BLOOD RIGHT ANTECUBITAL Performed at [facility], [place] [address], [place], [place] [address]    Special Requests   Final    BOTTLES DRAWN AEROBIC AND ANAEROBIC Blood Culture adequate volume Performed at [place] [address] [place], [place] [address]    Culture   Final    NO GROWTH 5 DAYS Performed at [facility], [place] [address] [place], [place] [address]    Report Status 09/28/2019 FINAL  Final  Culture, blood (routine x 2)     Status: None   Collection Time: 09/23/19  2:54 PM   Specimen: BLOOD  Result Value Ref [place] Status   Specimen Description   Final    BLOOD LEFT ANTECUBITAL Performed at [facility], [place] [address], [place], [place] [address]    Special Requests   Final    BOTTLES DRAWN AEROBIC AND ANAEROBIC Blood Culture adequate volume Performed at [place] [address], [place], [place] [address]    Culture   Final    NO GROWTH 5 DAYS Performed at [facility], [place] [address] [place],  [address]    Report Status 09/28/2019 FINAL  Final  MRSA PCR Screening  Status: None   Collection Time: 09/24/19  8:01 PM    Specimen: Nasal Mucosa; Nasopharyngeal  Result Value Ref [place] Status   MRSA by PCR NEGATIVE NEGATIVE Final    Comment:        The GeneXpert MRSA Assay (FDA approved for NASAL specimens only), is one component of a comprehensive MRSA colonization surveillance program. It is not intended to diagnose MRSA infection nor to guide or monitor treatment for MRSA infections. Performed at [facility], [place] [address], [place], [place] [address]       Radiology Studies: No results found.   Scheduled Meds:  amLODipine  10 mg Oral Daily   apixaban  10 mg Oral BID   Followed by   Derrill Memo ON 10/07/2019] apixaban  5 mg Oral BID   vitamin C  500 mg Oral Daily   baclofen  20 mg Oral TID   Chlorhexidine Gluconate Cloth  6 each Topical Daily   cloNIDine  0.1 mg Oral BID   dexamethasone (DECADRON) injection  6 mg Intravenous Q24H   docusate sodium  100 mg Oral TID   finasteride  5 mg Oral Daily   hydrochlorothiazide  12.5 mg Oral Daily   insulin aspart  0-9 Units Subcutaneous Q4H   insulin detemir  0.075 Units/kg Subcutaneous BID   irbesartan  150 mg Oral Daily   linagliptin  5 mg Oral Daily   mouth rinse  15 mL Mouth Rinse BID   multivitamin with minerals  1 tablet Oral Daily   oxybutynin  5 mg Oral QHS   pregabalin  75 mg Oral BID   traZODone  50 mg Oral QHS   zinc sulfate  220 mg Oral Daily   Continuous Infusions:    LOS: 8 days    Donnamae Jude, MD 10/01/2019 1:51 PM [phone] [facility] If 7PM-7AM, please contact night-coverage 10/01/2019, 1:51 PM

## 2019-10-02 DIAGNOSIS — J9600 Acute respiratory failure, unspecified whether with hypoxia or hypercapnia: Secondary | ICD-10-CM | POA: Diagnosis not present

## 2019-10-02 DIAGNOSIS — U071 COVID-19: Secondary | ICD-10-CM | POA: Diagnosis not present

## 2019-10-02 LAB — GLUCOSE, CAPILLARY
Glucose-Capillary: 102 mg/dL — ABNORMAL HIGH (ref 70–99)
Glucose-Capillary: 102 mg/dL — ABNORMAL HIGH (ref 70–99)
Glucose-Capillary: 112 mg/dL — ABNORMAL HIGH (ref 70–99)
Glucose-Capillary: 122 mg/dL — ABNORMAL HIGH (ref 70–99)
Glucose-Capillary: 136 mg/dL — ABNORMAL HIGH (ref 70–99)
Glucose-Capillary: 155 mg/dL — ABNORMAL HIGH (ref 70–99)

## 2019-10-02 NOTE — TOC Progression Note (Signed)
Transition of Care [facility]) - Progression Note    Patient Details  Name: Behr Slayton MRN: [id number] Date of Birth: Jun 28, 1950  Transition of Care [facility]) CM/SW Contact  Joaquin Courts, RN Phone Number: 10/02/2019, 2:39 PM  Clinical Narrative:   Everlene Balls authorization initiated (ref # [id number]), all requested clinicals faxed.    Expected Discharge Plan: [place] Barriers to Discharge: No SNF bed  Expected Discharge Plan and Services Expected Discharge Plan: [place] Choice: [place] arrangements for the past 2 months: Single Family Home                                       Social Determinants of Health (SDOH) Interventions    Readmission Risk Interventions No flowsheet data found.

## 2019-10-02 NOTE — Progress Notes (Signed)
PROGRESS NOTE    Larry [name]  [id number] DOB: [birth year] DOA: 09/23/2019 PCP: Flossie Buffy, NP   Brief Narrative: Patient is a 69 y.o.malewith medical history significant ofmultiple sclerosis, spinal cord injury currently on wheelchair, diabetes, hyperlipidemia, carpal tunnel syndrome, kidney stones, hypertension who came from the home On 09/23/19 with shortness of breath, wheezing as well as cough. He was exposed to his niece who was diagnosed with COVID-19 about 2 weeks PTA. In ED, Patient's oxygen sats were 58% on arrival by EMS. He was also 78% on arrival in the ER.he was ound to have elevated inflammatory markers.COVID-19 screen was positive. Chest x-ray shows bilateral lower lung airspace disease and consolidation. CT chest showed  pulmonary emboli involving the segmental and subsegmental branches of the left lower lobe right middle lobe and lingular lobe no evidence of right heart strain.  His hospital course remained stable.  His respiratory status gradually improved and currently he is on room air.  He completed the course of remdesivir and Decadron.  Inflammatory markers have significantly improved.  PT/OT recommended skilled facility.  He is hemodynamically stable for discharge as soon as bed is available.  Assessment & Plan:   Principal Problem:   Acute respiratory failure due to COVID-19 [facility]) Active Problems:   Paraplegia (HCC)   DM (diabetes mellitus) ([place])   MS (multiple sclerosis) ([place])   Hypertension   Overactive bladder   History of spinal cord injury   GERD (gastroesophageal reflux disease)   Benign prostatic hyperplasia with lower urinary tract symptoms   Pressure injury of skin  Acute respiratory failure due to COVID-19 Pneumonia/COVID-19 pneumonia: CT showed patchy groundglass opacities throughout both lungs with mid lower zones predominant. He was given Actemra.Completed remdesivir5 doses.He completed the course of decadron.  Multilobar  pulmonary emboli: Demonstrated findings suspicious for pulmonary emboli involving the segmental and subsegmental branches of the left lower lobe right middle lobe and lingular lobe no evidence of right heart strain. Patient was on therapeutic Lovenox,we have switched to Eliquis.  Transaminitis- likely from Covid, liver enzymes improved  AKI: Resolved  Hypertension:On amlodipine, clonidine, HCTZ, irbesartan.  Continue to monitor blood pressure  Anemia of chronic disease; stable  Elevated troponinin the setting of hypoxia 155>130>127, flat and downtrending.Suspected due to hypoxia/PE and Covid infection. Denies any chest pain.  Paraplegia/T10 compression fracture/overactive bladder/ MS: Continue supportive care, PT evaluation done.On baclofen.  GERD: Continue PPI  BPH: On proscar  [id number] IIstable :hemoglobin A1c at 5.9on October/2020, currently on  sliding scale insulin, lantus,linagliptin          DVT prophylaxis:Eliquis Code Status: Full Family Communication: Called daughter on phone today for update Status is: Inpatient  Remains inpatient appropriate because:Unsafe d/c plan   Dispo: The patient is from: Home              Anticipated d/c is to: SNF              Anticipated d/c date is: 2 days              Patient currently is medically stable to d/c.Waiting for bed in SNF         Consultants: None  Procedures:None  Antimicrobials:  Anti-infectives (From admission, onward)   Start     Dose/Rate Route Frequency Ordered Stop   09/24/19 1000  remdesivir 100 mg in sodium chloride 0.9 % 100 mL IVPB  Status:  Discontinued     100 mg 200 mL/hr over 30 Minutes Intravenous Daily 09/23/19 2149 09/23/19  2200   09/24/19 1000  remdesivir 100 mg in sodium chloride 0.9 % 100 mL IVPB     100 mg 200 mL/hr over 30 Minutes Intravenous Daily 09/23/19 2021 09/27/19 1041   09/23/19 2200  cefTRIAXone (ROCEPHIN) 1 g in sodium chloride 0.9 % 100 mL IVPB  Status:  Discontinued      1 g 200 mL/hr over 30 Minutes Intravenous Every 24 hours 09/23/19 2149 09/28/19 0856   09/23/19 2200  azithromycin (ZITHROMAX) 500 mg in sodium chloride 0.9 % 250 mL IVPB  Status:  Discontinued     500 mg 250 mL/hr over 60 Minutes Intravenous Every 24 hours 09/23/19 2149 09/28/19 0856   09/23/19 2148  remdesivir 200 mg in sodium chloride 0.9% 250 mL IVPB  Status:  Discontinued     200 mg 580 mL/hr over 30 Minutes Intravenous Once 09/23/19 2149 09/23/19 2200   09/23/19 2100  remdesivir 100 mg in sodium chloride 0.9 % 100 mL IVPB     100 mg 200 mL/hr over 30 Minutes Intravenous Every 30 min 09/23/19 2021 09/24/19 0240      Subjective: Patient seen and examined at the bedside this morning.  Hemodynamically stable.  Comfortable.  Alert and oriented.  No complaints of shortness of breath or cough.  Objective: Vitals:   10/02/19 0504 10/02/19 0505 10/02/19 0935 10/02/19 1347  BP: 97/69 97/69 109/90 (!) 165/128  Pulse: (!) 53 (!) 53  62  Resp: 15 15    Temp: 98.2 F (36.8 C) 98.2 F (36.8 C)  98.7 F (37.1 C)  TempSrc: Oral Oral  Oral  SpO2: 97% 100%  95%  Weight:      Height:        Intake/Output Summary (Last 24 hours) at 10/02/2019 1411 Last data filed at 10/02/2019 1350 Gross per 24 hour  Intake 600 ml  Output 575 ml  Net 25 ml   Filed Weights   09/24/19 0551 09/24/19 1900 09/27/19 0400  Weight: 74.8 kg 69.8 kg 72 kg    Examination:  General exam: Appears calm and comfortable ,Not in distress,average built HEENT:PERRL,Oral mucosa moist, Ear/Nose normal on gross exam Respiratory system: Bilateral equal air entry, normal vesicular breath sounds, no wheezes or crackles  Cardiovascular system: S1 & S2 heard, RRR. No JVD, murmurs, rubs, gallops or clicks. No pedal edema. Gastrointestinal system: Abdomen is nondistended, soft and nontender. No organomegaly or masses felt. Normal bowel sounds heard. Central nervous system: Alert and oriented. No focal neurological  deficits. Extremities: No edema, no clubbing ,no cyanosis, distal peripheral pulses palpable. Skin: No rashes, lesions or ulcers,no icterus ,no pallor    Data Reviewed: I have personally reviewed following labs and imaging studies  CBC: Recent Labs  Lab 09/26/19 0227 09/27/19 0212 09/28/19 0234 09/30/19 0224  WBC 8.2 8.4 7.9 8.0  NEUTROABS 6.6 6.7 6.5  --   HGB 11.8* 11.9* 11.7* 12.1*  HCT 36.4* 37.2* 37.2* 37.5*  MCV 96.0 97.6 97.4 96.9  PLT 376 384 382 [id number]*   Basic Metabolic Panel: Recent Labs  Lab 09/26/19 0227 09/27/19 0212 09/28/19 0234 09/30/19 0224  NA 140 140 138 138  K 3.9 4.3 4.7 4.6  CL 106 107 105 103  CO2 24 22 22 25   GLUCOSE 163* 150* 182* 144*  BUN 38* 37* 37* 32*  CREATININE 0.94 0.87 1.04 0.88  CALCIUM 8.6* 8.5* 8.6* 8.9  MG  --   --   --  1.8  PHOS  --   --   --  4.1   GFR: Estimated Creatinine Clearance: 72 mL/min (by C-G formula based on SCr of 0.88 mg/dL). Liver Function Tests: Recent Labs  Lab 09/26/19 0227 09/27/19 0212 09/28/19 0234 09/30/19 0224  AST 35 44* 52* 73*  ALT 20 23 27  48*  ALKPHOS 88 76 71 61  BILITOT 0.5 0.8 0.5 0.7  PROT 6.0* 5.7* 5.3* 5.4*  ALBUMIN 2.8* 2.7* 2.8* 2.9*   No results for input(s): LIPASE, AMYLASE in the last 168 hours. No results for input(s): AMMONIA in the last 168 hours. Coagulation Profile: No results for input(s): INR, PROTIME in the last 168 hours. Cardiac Enzymes: No results for input(s): CKTOTAL, CKMB, CKMBINDEX, TROPONINI in the last 168 hours. BNP (last 3 results) Recent Labs    03/24/19 1555  PROBNP 33.0   HbA1C: No results for input(s): HGBA1C in the last 72 hours. CBG: Recent Labs  Lab 10/01/19 1959 10/02/19 0006 10/02/19 0358 10/02/19 0733 10/02/19 1200  GLUCAP 135* 112* 155* 122* 102*   Lipid Profile: No results for input(s): CHOL, HDL, LDLCALC, TRIG, CHOLHDL, LDLDIRECT in the last 72 hours. Thyroid Function Tests: No results for input(s): TSH, T4TOTAL, FREET4,  T3FREE, THYROIDAB in the last 72 hours. Anemia Panel: No results for input(s): VITAMINB12, FOLATE, FERRITIN, TIBC, IRON, RETICCTPCT in the last 72 hours. Sepsis Labs: Recent Labs  Lab 09/27/19 1357  PROCALCITON <0.10    Recent Results (from the past 240 hour(s))  Culture, blood (routine x 2)     Status: None   Collection Time: 09/23/19  2:49 PM   Specimen: BLOOD  Result Value Ref [place] Status   Specimen Description   Final    BLOOD RIGHT ANTECUBITAL Performed at [facility], [place] [address] [place], [place] [address]    Special Requests   Final    BOTTLES DRAWN AEROBIC AND ANAEROBIC Blood Culture adequate volume Performed at [place] [address], [place], [place] [address]    Culture   Final    NO GROWTH 5 DAYS Performed at [facility], [place] [address] [place], [place] [address]    Report Status 09/28/2019 FINAL  Final  Culture, blood (routine x 2)     Status: None   Collection Time: 09/23/19  2:54 PM   Specimen: BLOOD  Result Value Ref [place] Status   Specimen Description   Final    BLOOD LEFT ANTECUBITAL Performed at [facility], [place] [address], [place], [place] [address]    Special Requests   Final    BOTTLES DRAWN AEROBIC AND ANAEROBIC Blood Culture adequate volume Performed at [place] [address] [place], [place] [address]    Culture   Final    NO GROWTH 5 DAYS Performed at [facility], [place] [address] [place], [place] [address]    Report Status 09/28/2019 FINAL  Final  MRSA PCR Screening     Status: None   Collection Time: 09/24/19  8:01 PM   Specimen: Nasal Mucosa; Nasopharyngeal  Result Value Ref [place] Status   MRSA by PCR NEGATIVE NEGATIVE Final    Comment:        The GeneXpert MRSA Assay (FDA approved for NASAL specimens only), is one component of a comprehensive MRSA colonization surveillance program. It is not intended to diagnose MRSA infection nor to guide  or monitor treatment for MRSA infections. Performed at [facility], [place] [address] [place], [place] [address]  Radiology Studies: No results found.      Scheduled Meds: . amLODipine  10 mg Oral Daily  . apixaban  10 mg Oral BID   Followed by  . [START ON 10/07/2019] apixaban  5 mg Oral BID  . vitamin C  500 mg Oral Daily  . baclofen  20 mg Oral TID  . cloNIDine  0.1 mg Oral BID  . dexamethasone (DECADRON) injection  6 mg Intravenous Q24H  . docusate sodium  100 mg Oral TID  . finasteride  5 mg Oral Daily  . hydrochlorothiazide  12.5 mg Oral Daily  . insulin aspart  0-9 Units Subcutaneous Q4H  . insulin detemir  0.075 Units/kg Subcutaneous BID  . irbesartan  150 mg Oral Daily  . linagliptin  5 mg Oral Daily  . mouth rinse  15 mL Mouth Rinse BID  . multivitamin with minerals  1 tablet Oral Daily  . oxybutynin  5 mg Oral QHS  . pregabalin  75 mg Oral BID  . traZODone  50 mg Oral QHS  . zinc sulfate  220 mg Oral Daily   Continuous Infusions:   LOS: 9 days    Time spent: 25 mins. More than 50% of that time was spent in counseling and/or coordination of care.      Shelly Coss, MD [facility] P5/07/2019, 2:11 PM

## 2019-10-02 NOTE — TOC Progression Note (Signed)
Transition of Care [facility]) - Progression Note    Patient Details  Name: Larry [name] MRN: [id number] Date of Birth: [birth year]  [facility]) CM/SW Contact  Joaquin Courts, RN Phone Number: 10/02/2019, 1:55 PM  Clinical Narrative:   Phoebe Perch faxed out to all area facilities accepting Covid + patients.     Expected Discharge Plan: [place] Barriers to Discharge: No SNF bed  Expected Discharge Plan and Services Expected Discharge Plan: [place] Choice: [place] arrangements for the past 2 months: Single Family Home                                       Social Determinants of Health (SDOH) Interventions    Readmission Risk Interventions No flowsheet data found.

## 2019-10-02 NOTE — NC FL2 (Signed)
[place] LEVEL OF CARE SCREENING TOOL     IDENTIFICATION  Patient Name: Larry [name] Birthdate: 08-02-1950 Sex: male Admission Date (Current Location): 09/23/2019  [facility] and [place] Number:  Herbalist and Address:  [facility],  [place] [address] [place]      Provider Number: [id number]  Attending Physician Name and Address:  Shelly Coss, MD  Relative Name and Phone Number:       Current Level of Care: Hospital Recommended Level of Care: [place] Prior Approval Number:    Date Approved/Denied:   PASRR Number: [id number]  Discharge Plan: SNF    Current Diagnoses: Patient Active Problem List   Diagnosis Date Noted  . Pressure injury of skin 09/25/2019  . Acute respiratory failure due to COVID-19 ([place]) 09/23/2019  . Benign prostatic hyperplasia with lower urinary tract symptoms 09/14/2019  . Cervical stenosis of spinal canal 05/09/2019  . Thoracic spinal stenosis 05/09/2019  . Gait abnormality 08/08/2018  . Spinal stenosis 08/08/2018  . Iron deficiency anemia 02/09/2018  . Bowel incontinence 01/01/2017  . Vitamin D deficiency 01/11/2016  . GERD (gastroesophageal reflux disease) 01/11/2016  . Spinal stenosis, lumbar region, with neurogenic claudication 01/10/2016  . Spinal stenosis of lumbar region with radiculopathy 01/10/2016  . Fever 01/01/2016  . Acute blood loss anemia 01/01/2016  . Elective surgery   . Post-operative pain   . Tachycardia   . Peripheral neuropathy   . Spondylolisthesis of lumbar region 12/30/2015  . CTS (carpal tunnel syndrome) 09/26/2015  . Lumbar stenosis 09/26/2015  . Weakness 09/05/2015  . Spinal stenosis in cervical region 06/12/2015  . Leukocytopenia 05/22/2013  . History of spinal cord injury 12/12/2011  . Paraplegia ([place]) 12/11/2011  . Altered mental status 12/11/2011  . DM (diabetes mellitus) ([place]) 12/11/2011  . Overactive bladder 12/11/2011  . MS (multiple  sclerosis) ([place])   . Hypertension     Orientation RESPIRATION BLADDER Height & Weight     Self, Time, Situation, Place  Normal Incontinent Weight: 72 kg Height:  5\' 7"  (170.2 cm)  BEHAVIORAL SYMPTOMS/MOOD NEUROLOGICAL BOWEL NUTRITION STATUS      Incontinent Diet  AMBULATORY STATUS COMMUNICATION OF NEEDS Skin   Total Care Verbally Normal                       Personal Care Assistance Level of Assistance  Bathing, Dressing, Total care Bathing Assistance: Maximum assistance   Dressing Assistance: Maximum assistance Total Care Assistance: Maximum assistance   Functional Limitations Info             SPECIAL CARE FACTORS FREQUENCY  PT (By licensed PT), OT (By licensed OT)     PT Frequency: 5x weekly OT Frequency: 5x weekly            Contractures Contractures Info: Not present    Additional Factors Info  Code Status, Allergies Code Status Info: Full Allergies Info: NKDA           Current Medications (10/02/2019):  This is the current hospital active medication list Current Facility-Administered Medications  Medication Dose Route Frequency Provider Last Rate Last Admin  . amLODipine (NORVASC) tablet 10 mg  10 mg Oral Daily Gala Romney L, MD   10 mg at 10/02/19 0910  . apixaban (ELIQUIS) tablet 10 mg  10 mg Oral BID Randa Spike, RPH   10 mg at 10/02/19 0910   Followed by  . [START ON 10/07/2019] apixaban (ELIQUIS) tablet  5 mg  5 mg Oral BID Shade, Haze Justin, RPH      . ascorbic acid (VITAMIN C) tablet 500 mg  500 mg Oral Daily Elwyn Reach, MD   500 mg at 10/02/19 0910  . baclofen (LIORESAL) tablet 20 mg  20 mg Oral TID Elwyn Reach, MD   20 mg at 10/02/19 0910  . chlorpheniramine-HYDROcodone (TUSSIONEX) 10-8 MG/5ML suspension 5 mL  5 mL Oral Q12H PRN Elwyn Reach, MD   5 mL at 09/24/19 2320  . cloNIDine (CATAPRES) tablet 0.1 mg  0.1 mg Oral BID Gala Romney L, MD   0.1 mg at 10/02/19 0936  . dexamethasone (DECADRON) injection 6 mg   6 mg Intravenous Q24H Gala Romney L, MD   6 mg at 10/01/19 2300  . docusate sodium (COLACE) capsule 100 mg  100 mg Oral TID Shawna Clamp, MD   100 mg at 10/02/19 0909  . finasteride (PROSCAR) tablet 5 mg  5 mg Oral Daily Gala Romney L, MD   5 mg at 10/02/19 0910  . guaiFENesin-dextromethorphan (ROBITUSSIN DM) 100-10 MG/5ML syrup 10 mL  10 mL Oral Q4H PRN Gala Romney L, MD      . hydrochlorothiazide (MICROZIDE) capsule 12.5 mg  12.5 mg Oral Daily Gala Romney L, MD   12.5 mg at 10/02/19 0936  . insulin aspart (novoLOG) injection 0-9 Units  0-9 Units Subcutaneous Q4H Elwyn Reach, MD   1 Units at 10/02/19 [id number]  . insulin detemir (LEVEMIR) injection 6 Units  0.075 Units/kg Subcutaneous BID Elwyn Reach, MD   6 Units at 10/02/19 0910  . Ipratropium-Albuterol (COMBIVENT) respimat 1 puff  1 puff Inhalation Q6H PRN Shawna Clamp, MD      . irbesartan (AVAPRO) tablet 150 mg  150 mg Oral Daily Gala Romney L, MD   150 mg at 10/02/19 0910  . linagliptin (TRADJENTA) tablet 5 mg  5 mg Oral Daily Elwyn Reach, MD   5 mg at 10/02/19 0909  . MEDLINE mouth rinse  15 mL Mouth Rinse BID Swayze, Ava, DO   15 mL at 10/02/19 0911  . multivitamin with minerals tablet 1 tablet  1 tablet Oral Daily Elwyn Reach, MD   1 tablet at 10/02/19 0909  . oxybutynin (DITROPAN-XL) 24 hr tablet 5 mg  5 mg Oral QHS Elwyn Reach, MD   5 mg at 10/01/19 2301  . pregabalin (LYRICA) capsule 75 mg  75 mg Oral BID Elwyn Reach, MD   75 mg at 10/02/19 0910  . sodium chloride (OCEAN) 0.65 % nasal spray 1 spray  1 spray Each Nare PRN Antonieta Pert, MD   1 spray at 09/30/19 2022  . traZODone (DESYREL) tablet 50 mg  50 mg Oral QHS Gala Romney L, MD   50 mg at 10/01/19 2300  . zinc sulfate capsule 220 mg  220 mg Oral Daily Elwyn Reach, MD   220 mg at 10/02/19 [id number]     Discharge Medications: Please see discharge summary for a list of discharge medications.  Relevant Imaging  Results:  Relevant Lab Results:   Additional Information ss#[id number]  Joaquin Courts, RN

## 2019-10-02 NOTE — TOC Progression Note (Signed)
Transition of Care [facility]) - Progression Note    Patient Details  Name: Larry [name] MRN: [id number] Date of Birth: 07/22/1950  [facility] CM/SW Contact  Purcell Mouton, RN Phone Number: 10/02/2019, 12:22 PM  Clinical Narrative:    Spoke with pt concerning SNF, pt agreed.         Expected Discharge Plan and Services                                                 Social Determinants of Health (SDOH) Interventions    Readmission Risk Interventions No flowsheet data found.

## 2019-10-02 NOTE — Progress Notes (Signed)
Occupational Therapy Treatment Patient Details Name: Larry [name] MRN: [id number] DOB: Aug 18, 1950 Today's Date: 10/02/2019    History of present illness As per chart: 69 y.o. male with medical history significant of multiple sclerosis, T 10 spinal cord injury currently mod I with wheelchair, diabetes, hyperlipidemia, carpal tunnel syndrome, kidney stones, hypertension who came from the home  On 09/23/19 with shortness of breath, wheezing as well as cough.he was exposed to his niece who was diagnosed with COVID-19 about 2 weeks PTA.   OT comments  Pt not able to go home at mod I level at this time. Pt is agreeable to post acute rehab.   Follow Up Recommendations  SNF    Equipment Recommendations  None recommended by OT    Recommendations for Other Services      Precautions / Restrictions Precautions Precautions: Fall Precaution Comments: MS and T10 Para Restrictions Weight Bearing Restrictions: No       Mobility Bed Mobility Overal bed mobility: Needs Assistance Bed Mobility: Supine to Sit     Supine to sit: Min assist Sit to supine: Min assist   General bed mobility comments: EOB  with OT on arrival  Transfers Overall transfer level: Needs assistance Equipment used: None Transfers: Lateral/Scoot Transfers          Lateral/Scoot Transfers: Max assist General transfer comment: set pt's personal  wc to pt's R side 90 degrees and flush to bed as directed by pt of what her ias use to doing at home.  Bed height also adjusted.  Pt attempted to self scoot from bed to wheelchair however he was unable to successufuly complete on his own as prior to adnit he was able.  Pt present with zero B LE mvmt/ability.  B UE's were "not as strong as ther were" before this admit.  Pt was unable to clear hips past bed/wheelchair gab.  Pt required assist to complete.    Balance Overall balance assessment: Needs assistance Sitting-balance support: Feet unsupported;No upper extremity  supported Sitting balance-Leahy Scale: Fair Sitting balance - Comments: can sit without UE support       Standing balance comment: nt                           ADL either performed or assessed with clinical judgement   ADL Overall ADL's : Needs assistance/impaired     Grooming: Wash/dry face;Sitting Grooming Details (indicate cue type and reason): sitting in WC                                               Cognition Arousal/Alertness: Awake/alert Behavior During Therapy: WFL for tasks assessed/performed Overall Cognitive Status: Within Functional Limits for tasks assessed                                 General Comments: very pleasant                   Pertinent Vitals/ Pain       Pain Assessment: No/denies pain         Frequency  Min 2X/week        Progress Toward Goals  OT Goals(current goals can now be found in the care plan section)  Progress towards OT goals: Progressing toward goals  Plan Discharge plan needs to be updated       AM-PAC OT "6 Clicks" Daily Activity     Outcome Measure   Help from another person eating meals?: A Little Help from another person taking care of personal grooming?: A Little Help from another person toileting, which includes using toliet, bedpan, or urinal?: A Lot Help from another person bathing (including washing, rinsing, drying)?: A Lot Help from another person to put on and taking off regular upper body clothing?: A Little Help from another person to put on and taking off regular lower body clothing?: A Lot 6 Click Score: 15    End of Session Equipment Utilized During Treatment: Other (comment)(WC)  OT Visit Diagnosis: Unsteadiness on feet (R26.81);Other abnormalities of gait and mobility (R26.89);Muscle weakness (generalized) (M62.81)   Activity Tolerance Patient tolerated treatment well   Patient Left in bed;with bed alarm set   Nurse Communication Mobility  status        Time: 1011-1030 OT Time Calculation (min): 19 min  Charges: OT General Charges $OT Visit: 1 Visit OT Treatments $Self Care/Home Management : 8-22 mins  Kari Baars, [place] [phone] Office- [place], Edwena Felty D 10/02/2019, 2:40 PM

## 2019-10-02 NOTE — Plan of Care (Signed)
  Problem: Activity: Goal: Risk for activity intolerance will decrease Outcome: Progressing   Problem: Nutrition: Goal: Adequate nutrition will be maintained Outcome: Progressing   Problem: Pain Managment: Goal: General experience of comfort will improve Outcome: Progressing   Problem: Nutritional: Goal: Maintenance of adequate nutrition will improve Outcome: Progressing   

## 2019-10-02 NOTE — Progress Notes (Signed)
Physical Therapy Treatment Patient Details Name: Larry [name] MRN: [id number] DOB: [birth year] Today's Date: 10/02/2019    History of Present Illness As per chart: 69 y.o. male with medical history significant of multiple sclerosis, T 10 spinal cord injury currently mod I with wheelchair, diabetes, hyperlipidemia, carpal tunnel syndrome, kidney stones, hypertension who came from the home  On 09/23/19 with shortness of breath, wheezing as well as cough.he was exposed to his niece who was diagnosed with COVID-19 about 2 weeks PTA.    PT Comments    General bed mobility comments: EOB  with OT on arrival.   Lateral/Scoot Transfers: Max assist.  General transfer comment: set pt's personal  wc to pt's R side 90 degrees and flush to bed as directed by pt of what her ias use to doing at home.  Bed height also adjusted.  Pt attempted to self scoot from bed to wheelchair however he was unable to successufuly complete on his own as prior to adnit he was able.  Pt present with zero B LE mvmt/ability.  B UE's were "not as strong as ther were" before this admit.  Pt was unable to clear hips past bed/wheelchair gab.  Pt required assist to complete. Pt lives home alone and will need to be able to get stronger and be Indep get in/out of his wheelchair.  Pt will need ST Rehab at SNF prior to returning home.  Pt agrees.    Follow Up Recommendations  SNF     Equipment Recommendations  None recommended by PT    Recommendations for Other Services       Precautions / Restrictions Precautions Precautions: Fall Precaution Comments: MS and T10 Para Restrictions Weight Bearing Restrictions: No RLE Weight Bearing: Non weight bearing LLE Weight Bearing: Non weight bearing    Mobility  Bed Mobility               General bed mobility comments: EOB  with OT on arrival  Transfers Overall transfer level: Needs assistance Equipment used: None Transfers: Lateral/Scoot Transfers          Lateral/Scoot  Transfers: Max assist General transfer comment: set pt's personal  wc to pt's R side 90 degrees and flush to bed as directed by pt of what her ias use to doing at home.  Bed height also adjusted.  Pt attempted to self scoot from bed to wheelchair however he was unable to successufuly complete on his own as prior to adnit he was able.  Pt present with zero B LE mvmt/ability.  B UE's were "not as strong as ther were" before this admit.  Pt was unable to clear hips past bed/wheelchair gab.  Pt required assist to complete.  Ambulation/Gait             General Gait Details: non amb and uses a wheelchair for mobility   Stairs             Wheelchair Mobility    Modified Rankin (Stroke Patients Only)       Balance                                            Cognition Arousal/Alertness: Awake/alert Behavior During Therapy: WFL for tasks assessed/performed Overall Cognitive Status: Within Functional Limits for tasks assessed  General Comments: very pleasant      Exercises      General Comments        Pertinent Vitals/Pain Pain Assessment: No/denies pain    Home Living                      Prior Function            PT Goals (current goals can now be found in the care plan section) Progress towards PT goals: Progressing toward goals    Frequency    Min 3X/week      PT Plan Current plan remains appropriate    Co-evaluation              AM-PAC PT "6 Clicks" Mobility   Outcome Measure  Help needed turning from your back to your side while in a flat bed without using bedrails?: A Little Help needed moving from lying on your back to sitting on the side of a flat bed without using bedrails?: A Little Help needed moving to and from a bed to a chair (including a wheelchair)?: A Lot Help needed standing up from a chair using your arms (e.g., wheelchair or bedside chair)?: A Lot Help  needed to walk in hospital room?: Total Help needed climbing 3-5 steps with a railing? : Total 6 Click Score: 12    End of Session Equipment Utilized During Treatment: Gait belt Activity Tolerance: Patient limited by fatigue Patient left: Other (comment);with call bell/phone within reach(personal wheelchair) Nurse Communication: Mobility status(pt agrrees to SNF for ST Rehab) PT Visit Diagnosis: Muscle weakness (generalized) (M62.81);Other symptoms and signs involving the nervous system (R29.898)     Time: [id number] PT Time Calculation (min) (ACUTE ONLY): 16 min  Charges:  $Therapeutic Activity: 8-22 mins                     Rica Koyanagi  PTA Acute  Rehabilitation Services Pager      [phone] Office      [phone]

## 2019-10-03 DIAGNOSIS — J9600 Acute respiratory failure, unspecified whether with hypoxia or hypercapnia: Secondary | ICD-10-CM | POA: Diagnosis not present

## 2019-10-03 DIAGNOSIS — U071 COVID-19: Secondary | ICD-10-CM | POA: Diagnosis not present

## 2019-10-03 LAB — GLUCOSE, CAPILLARY
Glucose-Capillary: 101 mg/dL — ABNORMAL HIGH (ref 70–99)
Glucose-Capillary: 104 mg/dL — ABNORMAL HIGH (ref 70–99)
Glucose-Capillary: 121 mg/dL — ABNORMAL HIGH (ref 70–99)
Glucose-Capillary: 150 mg/dL — ABNORMAL HIGH (ref 70–99)
Glucose-Capillary: 94 mg/dL (ref 70–99)
Glucose-Capillary: 94 mg/dL (ref 70–99)

## 2019-10-03 MED ORDER — INSULIN ASPART 100 UNIT/ML ~~LOC~~ SOLN: 0.0000 [IU] | Freq: Every day | SUBCUTANEOUS | Status: DC

## 2019-10-03 MED ORDER — INSULIN ASPART 100 UNIT/ML ~~LOC~~ SOLN
0.0000 [IU] | Freq: Three times a day (TID) | SUBCUTANEOUS | Status: DC
  Administered 2019-10-04: 12:00:00 2 [IU] via SUBCUTANEOUS

## 2019-10-03 NOTE — Care Management Important Message (Signed)
Important Message  Patient Details IM Letter given to Collene Schlichter RN Case Manager to present to the Patient Name: Larry [name] MRN: [id number] Date of Birth: Oct 20, 1950   Medicare Important Message Given:  Yes     Kerin Salen 10/03/2019, 11:51 AM

## 2019-10-03 NOTE — Plan of Care (Signed)
  Problem: Education: Goal: Knowledge of risk factors and measures for prevention of condition will improve Outcome: Completed/Met   Problem: Coping: Goal: Psychosocial and spiritual needs will be supported Outcome: Progressing   Problem: Respiratory: Goal: Will maintain a patent airway Outcome: Completed/Met Goal: Complications related to the disease process, condition or treatment will be avoided or minimized Outcome: Completed/Met   Problem: Education: Goal: Knowledge of General Education information will improve Description: Including pain rating scale, medication(s)/side effects and non-pharmacologic comfort measures Outcome: Completed/Met   Problem: Health Behavior/Discharge Planning: Goal: Ability to manage health-related needs will improve Outcome: Progressing   Problem: Clinical Measurements: Goal: Ability to maintain clinical measurements within normal limits will improve Outcome: Completed/Met Goal: Will remain free from infection Outcome: Completed/Met Goal: Diagnostic test results will improve Outcome: Completed/Met Goal: Respiratory complications will improve Outcome: Completed/Met Goal: Cardiovascular complication will be avoided Outcome: Completed/Met   Problem: Activity: Goal: Risk for activity intolerance will decrease Outcome: Completed/Met   Problem: Nutrition: Goal: Adequate nutrition will be maintained Outcome: Completed/Met   Problem: Coping: Goal: Level of anxiety will decrease Outcome: Completed/Met   Problem: Elimination: Goal: Will not experience complications related to bowel motility Outcome: Completed/Met Goal: Will not experience complications related to urinary retention Outcome: Completed/Met   Problem: Pain Managment: Goal: General experience of comfort will improve Outcome: Completed/Met   Problem: Safety: Goal: Ability to remain free from injury will improve Outcome: Completed/Met   Problem: Skin Integrity: Goal: Risk  for impaired skin integrity will decrease Outcome: Completed/Met   Problem: Health Behavior/Discharge Planning: Goal: Ability to manage health-related needs will improve Outcome: Progressing   Problem: Nutritional: Goal: Maintenance of adequate nutrition will improve Outcome: Completed/Met Goal: Progress toward achieving an optimal weight will improve Outcome: Completed/Met   Problem: Skin Integrity: Goal: Risk for impaired skin integrity will decrease Outcome: Completed/Met

## 2019-10-03 NOTE — Progress Notes (Signed)
PROGRESS NOTE    Larry [name]  [id number] DOB: 01/31/1951 DOA: 09/23/2019 PCP: Flossie Buffy, NP   Brief Narrative: Patient is a 69 y.o.malewith medical history significant ofmultiple sclerosis, spinal cord injury currently on wheelchair, diabetes, hyperlipidemia, carpal tunnel syndrome, kidney stones, hypertension who came from the home On 09/23/19 with shortness of breath, wheezing as well as cough. He was exposed to his niece who was diagnosed with COVID-19 about 2 weeks PTA. In ED, Patient's oxygen sats were 58% on arrival by EMS. He was also 78% on arrival in the ER.he was ound to have elevated inflammatory markers.COVID-19 screen was positive. Chest x-ray shows bilateral lower lung airspace disease and consolidation. CT chest showed  pulmonary emboli involving the segmental and subsegmental branches of the left lower lobe right middle lobe and lingular lobe no evidence of right heart strain.  His hospital course remained stable.  His respiratory status gradually improved and currently he is on room air.  He completed the course of remdesivir and Decadron.  Inflammatory markers have significantly improved.  PT/OT recommended skilled facility.  He is hemodynamically stable for discharge as soon as bed is available.  Assessment & Plan:   Principal Problem:   Acute respiratory failure due to COVID-19 [facility]) Active Problems:   Paraplegia (HCC)   DM (diabetes mellitus) ([place])   MS (multiple sclerosis) ([place])   Hypertension   Overactive bladder   History of spinal cord injury   GERD (gastroesophageal reflux disease)   Benign prostatic hyperplasia with lower urinary tract symptoms   Pressure injury of skin  Acute respiratory failure due to COVID-19 Pneumonia/COVID-19 pneumonia: CT showed patchy groundglass opacities throughout both lungs with mid lower zones predominant. He was given Actemra.Completed remdesivir5 doses.He completed the course of decadron.  Multilobar  pulmonary emboli: Demonstrated findings suspicious for pulmonary emboli involving the segmental and subsegmental branches of the left lower lobe right middle lobe and lingular lobe no evidence of right heart strain. Patient was on therapeutic Lovenox,we have switched to Eliquis.  Transaminitis- likely from Covid, liver enzymes improved  AKI: Resolved  Hypertension:On amlodipine, clonidine, HCTZ, irbesartan.  Continue to monitor blood pressure  Anemia of chronic disease; stable  Elevated troponinin the setting of hypoxia 155>130>127, flat and downtrending.Suspected due to hypoxia/PE and Covid infection. Denies any chest pain.  Paraplegia/T10 compression fracture/overactive bladder/ MS: Continue supportive care, PT evaluation done.On baclofen. Recommended to skilled nursing facility by physical therapy.  GERD: Continue PPI  BPH: On proscar  [id number] IIstable :hemoglobin A1c at 5.9on October/2020, currently on  sliding scale insulin, levimir,linagliptin          DVT prophylaxis:Eliquis Code Status: Full Family Communication: Called daughter on phone on 10/02/19 for update Status is: Inpatient  Remains inpatient appropriate because:Unsafe d/c plan   Dispo: The patient is from: Home              Anticipated d/c is to: SNF              Anticipated d/c date is: 2 days              Patient currently is medically stable to d/c.Waiting for bed in SNF         Consultants: None  Procedures:None  Antimicrobials:  Anti-infectives (From admission, onward)   Start     Dose/Rate Route Frequency Ordered Stop   09/24/19 1000  remdesivir 100 mg in sodium chloride 0.9 % 100 mL IVPB  Status:  Discontinued     100 mg 200  mL/hr over 30 Minutes Intravenous Daily 09/23/19 2149 09/23/19 2200   09/24/19 1000  remdesivir 100 mg in sodium chloride 0.9 % 100 mL IVPB     100 mg 200 mL/hr over 30 Minutes Intravenous Daily 09/23/19 2021 09/27/19 1041   09/23/19 2200  cefTRIAXone  (ROCEPHIN) 1 g in sodium chloride 0.9 % 100 mL IVPB  Status:  Discontinued     1 g 200 mL/hr over 30 Minutes Intravenous Every 24 hours 09/23/19 2149 09/28/19 0856   09/23/19 2200  azithromycin (ZITHROMAX) 500 mg in sodium chloride 0.9 % 250 mL IVPB  Status:  Discontinued     500 mg 250 mL/hr over 60 Minutes Intravenous Every 24 hours 09/23/19 2149 09/28/19 0856   09/23/19 2148  remdesivir 200 mg in sodium chloride 0.9% 250 mL IVPB  Status:  Discontinued     200 mg 580 mL/hr over 30 Minutes Intravenous Once 09/23/19 2149 09/23/19 2200   09/23/19 2100  remdesivir 100 mg in sodium chloride 0.9 % 100 mL IVPB     100 mg 200 mL/hr over 30 Minutes Intravenous Every 30 min 09/23/19 2021 09/24/19 0240      Subjective: Patient seen and examined the bedside this morning.  Hemodynamically stable.  No new complaints.  Alert and oriented, denies any shortness of breath or cough.  On room air  Objective: Vitals:   10/02/19 0935 10/02/19 1347 10/02/19 2005 10/03/19 0449  BP: 109/90 (!) 165/128 (!) 115/96 114/78  Pulse:  62 62 77  Resp:   (!) 22 11  Temp:  98.7 F (37.1 C) 98.2 F (36.8 C) 97.8 F (36.6 C)  TempSrc:  Oral Oral Oral  SpO2:  95% 98% 98%  Weight:      Height:        Intake/Output Summary (Last 24 hours) at 10/03/2019 1238 Last data filed at 10/03/2019 1000 Gross per 24 hour  Intake 480 ml  Output 1500 ml  Net -1020 ml   Filed Weights   09/24/19 0551 09/24/19 1900 09/27/19 0400  Weight: 74.8 kg 69.8 kg 72 kg    Examination:  General exam: Appears calm and comfortable ,Not in distress HEENT:PERRL,Oral mucosa moist, Ear/Nose normal on gross exam Respiratory system: Bilateral equal air entry, normal vesicular breath sounds, no wheezes or crackles  Cardiovascular system: S1 & S2 heard, RRR. No JVD, murmurs, rubs, gallops or clicks. Gastrointestinal system: Abdomen is nondistended, soft and nontender. No organomegaly or masses felt. Normal bowel sounds heard. Central nervous  system: Alert and oriented. No focal neurological deficits. Extremities: No edema, no clubbing ,no cyanosis, distal peripheral pulses palpable. Skin: No rashes, lesions or ulcers,no icterus ,no pallor     Data Rviewed: I have personally reviewed following labs and imaging studies  CBC: Recent Labs  Lab 09/27/19 0212 09/28/19 0234 09/30/19 0224  WBC 8.4 7.9 8.0  NEUTROABS 6.7 6.5  --   HGB 11.9* 11.7* 12.1*  HCT 37.2* 37.2* 37.5*  MCV 97.6 97.4 96.9  PLT 384 382 [id number]*   Basic Metabolic Panel: Recent Labs  Lab 09/27/19 0212 09/28/19 0234 09/30/19 0224  NA 140 138 138  K 4.3 4.7 4.6  CL 107 105 103  CO2 22 22 25   GLUCOSE 150* 182* 144*  BUN 37* 37* 32*  CREATININE 0.87 1.04 0.88  CALCIUM 8.5* 8.6* 8.9  MG  --   --  1.8  PHOS  --   --  4.1   GFR: Estimated Creatinine Clearance: 72 mL/min (by C-G formula based  on SCr of 0.88 mg/dL). Liver Function Tests: Recent Labs  Lab 09/27/19 0212 09/28/19 0234 09/30/19 0224  AST 44* 52* 73*  ALT 23 27 48*  ALKPHOS 76 71 61  BILITOT 0.8 0.5 0.7  PROT 5.7* 5.3* 5.4*  ALBUMIN 2.7* 2.8* 2.9*   No results for input(s): LIPASE, AMYLASE in the last 168 hours. No results for input(s): AMMONIA in the last 168 hours. Coagulation Profile: No results for input(s): INR, PROTIME in the last 168 hours. Cardiac Enzymes: No results for input(s): CKTOTAL, CKMB, CKMBINDEX, TROPONINI in the last 168 hours. BNP (last 3 results) Recent Labs    03/24/19 1555  PROBNP 33.0   HbA1C: No results for input(s): HGBA1C in the last 72 hours. CBG: Recent Labs  Lab 10/02/19 2006 10/03/19 0006 10/03/19 0447 10/03/19 0722 10/03/19 1135  GLUCAP 136* 121* 94 94 104*   Lipid Profile: No results for input(s): CHOL, HDL, LDLCALC, TRIG, CHOLHDL, LDLDIRECT in the last 72 hours. Thyroid Function Tests: No results for input(s): TSH, T4TOTAL, FREET4, T3FREE, THYROIDAB in the last 72 hours. Anemia Panel: No results for input(s): VITAMINB12, FOLATE,  FERRITIN, TIBC, IRON, RETICCTPCT in the last 72 hours. Sepsis Labs: Recent Labs  Lab 09/27/19 1357  PROCALCITON <0.10    Recent Results (from the past 240 hour(s))  Culture, blood (routine x 2)     Status: None   Collection Time: 09/23/19  2:49 PM   Specimen: BLOOD  Result Value Ref Range Status   Specimen Description   Final    BLOOD RIGHT ANTECUBITAL Performed at [facility], [place] [address] [place], [place] [address]    Special Requests   Final    BOTTLES DRAWN AEROBIC AND ANAEROBIC Blood Culture adequate volume Performed at [place] [address], [place], [place] [address]    Culture   Final    NO GROWTH 5 DAYS Performed at [facility], [place] [address] [place], [place] [address]    Report Status 09/28/2019 FINAL  Final  Culture, blood (routine x 2)     Status: None   Collection Time: 09/23/19  2:54 PM   Specimen: BLOOD  Result Value Ref Range Status   Specimen Description   Final    BLOOD LEFT ANTECUBITAL Performed at [facility], [place] [address] [place], [place] [address]    Special Requests   Final    BOTTLES DRAWN AEROBIC AND ANAEROBIC Blood Culture adequate volume Performed at [place] [address], [place], [place] [address]    Culture   Final    NO GROWTH 5 DAYS Performed at [facility], [place] [address], [place], [place] [address]    Report Status 09/28/2019 FINAL  Final  MRSA PCR Screening     Status: None   Collection Time: 09/24/19  8:01 PM   Specimen: Nasal Mucosa; Nasopharyngeal  Result Value Ref Range Status   MRSA by PCR NEGATIVE NEGATIVE Final    Comment:        The GeneXpert MRSA Assay (FDA approved for NASAL specimens only), is one component of a comprehensive MRSA colonization surveillance program. It is not intended to diagnose MRSA infection nor to guide or monitor treatment for MRSA infections. Performed at [facility], [place] [address], [place], [place] [address]          Radiology Studies: No results found.      Scheduled Meds: . amLODipine  10 mg Oral  Daily  . apixaban  10 mg Oral BID   Followed by  . [START ON 10/07/2019] apixaban  5 mg Oral BID  . vitamin C  500 mg Oral Daily  . baclofen  20 mg Oral TID  . cloNIDine  0.1 mg Oral BID  . docusate sodium  100 mg Oral TID  . finasteride  5 mg Oral Daily  . hydrochlorothiazide  12.5 mg Oral Daily  . insulin aspart  0-9 Units Subcutaneous Q4H  . insulin detemir  0.075 Units/kg Subcutaneous BID  . irbesartan  150 mg Oral Daily  . linagliptin  5 mg Oral Daily  . mouth rinse  15 mL Mouth Rinse BID  . multivitamin with minerals  1 tablet Oral Daily  . oxybutynin  5 mg Oral QHS  . pregabalin  75 mg Oral BID  . traZODone  50 mg Oral QHS  . zinc sulfate  220 mg Oral Daily   Continuous Infusions:   LOS: 10 days    Time spent: 25 mins. More than 50% of that time was spent in counseling and/or coordination of care.      Shelly Coss, MD [facility] P5/08/2019, 12:38 PM

## 2019-10-04 DIAGNOSIS — I2699 Other pulmonary embolism without acute cor pulmonale: Secondary | ICD-10-CM | POA: Diagnosis not present

## 2019-10-04 DIAGNOSIS — J9601 Acute respiratory failure with hypoxia: Secondary | ICD-10-CM | POA: Diagnosis not present

## 2019-10-04 DIAGNOSIS — Z743 Need for continuous supervision: Secondary | ICD-10-CM | POA: Diagnosis not present

## 2019-10-04 DIAGNOSIS — G8220 Paraplegia, unspecified: Secondary | ICD-10-CM | POA: Diagnosis not present

## 2019-10-04 DIAGNOSIS — M2550 Pain in unspecified joint: Secondary | ICD-10-CM | POA: Diagnosis not present

## 2019-10-04 DIAGNOSIS — R2689 Other abnormalities of gait and mobility: Secondary | ICD-10-CM | POA: Diagnosis not present

## 2019-10-04 DIAGNOSIS — Z7401 Bed confinement status: Secondary | ICD-10-CM | POA: Diagnosis not present

## 2019-10-04 DIAGNOSIS — G35 Multiple sclerosis: Secondary | ICD-10-CM | POA: Diagnosis not present

## 2019-10-04 DIAGNOSIS — M6281 Muscle weakness (generalized): Secondary | ICD-10-CM | POA: Diagnosis not present

## 2019-10-04 DIAGNOSIS — J9600 Acute respiratory failure, unspecified whether with hypoxia or hypercapnia: Secondary | ICD-10-CM | POA: Diagnosis not present

## 2019-10-04 DIAGNOSIS — U071 COVID-19: Secondary | ICD-10-CM | POA: Diagnosis not present

## 2019-10-04 DIAGNOSIS — E119 Type 2 diabetes mellitus without complications: Secondary | ICD-10-CM | POA: Diagnosis not present

## 2019-10-04 DIAGNOSIS — J1282 Pneumonia due to coronavirus disease 2019: Secondary | ICD-10-CM | POA: Diagnosis not present

## 2019-10-04 LAB — GLUCOSE, CAPILLARY
Glucose-Capillary: 83 mg/dL (ref 70–99)
Glucose-Capillary: 161 mg/dL — ABNORMAL HIGH (ref 70–99)
Glucose-Capillary: 100 mg/dL — ABNORMAL HIGH (ref 70–99)

## 2019-10-04 MED ORDER — APIXABAN 5 MG PO TABS: 10.0000 mg | ORAL_TABLET | Freq: Two times a day (BID) | ORAL | 0 refills | Status: DC

## 2019-10-04 MED ORDER — ASCORBIC ACID 500 MG PO TABS: 500.0000 mg | ORAL_TABLET | Freq: Every day | ORAL | 0 refills | Status: AC

## 2019-10-04 MED ORDER — APIXABAN 5 MG PO TABS: 5.0000 mg | ORAL_TABLET | Freq: Two times a day (BID) | ORAL | 1 refills | Status: DC

## 2019-10-04 MED ORDER — ZINC SULFATE 220 (50 ZN) MG PO CAPS: 220.0000 mg | ORAL_CAPSULE | Freq: Every day | ORAL | 0 refills | Status: AC

## 2019-10-04 NOTE — TOC Progression Note (Signed)
Transition of Care [facility]) - Progression Note    Patient Details  Name: Larry [name] MRN: [id number] Date of Birth: Jun 26, 1950  Transition of Care [facility]) CM/SW Contact  Purcell Mouton, RN Phone Number: 10/04/2019, 12:30 PM  Clinical Narrative:    Spoke with pt's daughter Antonyo Huebel [phone] concerning SNF and where pt was going to [place]. Daughter asked about home health after discharge from SNF. Encouraged Kaylyn to reach out to [place] [facility].    Expected Discharge Plan: Skilled Nursing Facility Barriers to Discharge: No SNF bed  Expected Discharge Plan and Services Expected Discharge Plan: [place] Choice: [place] arrangements for the past 2 months: Single Family Home Expected Discharge Date: 10/02/19                                     Social Determinants of Health (SDOH) Interventions    Readmission Risk Interventions No flowsheet data found.

## 2019-10-04 NOTE — Progress Notes (Signed)
[facility] arrived to transport patient. VSS. Daughter, [name], called and made aware.

## 2019-10-04 NOTE — Discharge Summary (Signed)
Physician Discharge Summary  Larry [name] LFY:[id number] DOB: [birth year] DOA: 09/23/2019  PCP: Flossie Buffy, NP  Admit date: 09/23/2019 Discharge date: 10/04/2019  Admitted From: Home Disposition:  SNF  Discharge Condition:Stable CODE STATUS:FULL Diet recommendation: Heart Healthy  Brief/Interim Summary:  Patient is a 69 y.o.malewith medical history significant ofmultiple sclerosis, spinal cord injury currently on wheelchair, diabetes, hyperlipidemia, carpal tunnel syndrome, kidney stones, hypertension who came from the home On 09/23/19 with shortness of breath, wheezing as well as cough. He was exposed to his niece who was diagnosed with COVID-19 about 2 weeks PTA. In ED, Patient's oxygen sats were 58% on arrival by EMS. He was also 78% on arrival in the ER.he was ound to have elevated inflammatory markers.COVID-19 screen was positive. Chest x-ray shows bilateral lower lung airspace disease and consolidation. CT chest showed  pulmonary emboli involving the segmental and subsegmental branches of the left lower lobe right middle lobe and lingular lobe no evidence of right heart strain.  His hospital course remained stable.  His respiratory status gradually improved and currently he is on room air.  He completed the course of remdesivir and Decadron.  Inflammatory markers have significantly improved.  PT/OT recommended skilled facility.  He is hemodynamically stable for discharge .  Following problems were addressed during his hospitalization:  Acute respiratory failure due to COVID-19 Pneumonia/COVID-19 pneumonia: CT showed patchy groundglass opacities throughout both lungs with mid lower zones predominant. He was given Actemra.Completed remdesivir5 doses.He completed the course of decadron.  Multilobar pulmonary emboli: Demonstrated findings suspicious for pulmonary emboli involving the segmental and subsegmental branches of the left lower lobe right middle lobe and lingular  lobe no evidence of right heart strain. Patient was on therapeutic Lovenox,we have switched to Eliquis.  Transaminitis- likely from Covid, liver enzymes improved  AKI: Resolved  Hypertension:On amlodipine, clonidine, HCTZ, irbesartan.  Continue to monitor blood pressure  Anemia of chronic disease; Stable  Elevated troponinin the setting of hypoxia 155>130>127, flat and downtrending.Suspected due to hypoxia/PE and Covid infection. Denies any chest pain.  Paraplegia/T10 compression fracture/overactive bladder/ MS: Continue supportive care, PT evaluation done.On baclofen. Recommended skilled nursing facility by physical therapy.  BPH: On proscar  EN:IDPO IIstable :hemoglobin A1c at 5.9on October/2020, currently on  metformin at home    Discharge Diagnoses:  Principal Problem:   Acute respiratory failure due to COVID-19 [facility]) Active Problems:   Paraplegia ([place])   DM (diabetes mellitus) ([place])   MS (multiple sclerosis) ([place])   Hypertension   Overactive bladder   History of spinal cord injury   GERD (gastroesophageal reflux disease)   Benign prostatic hyperplasia with lower urinary tract symptoms   Pressure injury of skin    Discharge Instructions  Discharge Instructions    MyChart COVID-19 home monitoring program   Complete by: Oct 02, 2019    Is the patient willing to use the [employer] for home monitoring?: Yes   Call MD for:  difficulty breathing, headache or visual disturbances   Complete by: As directed    Call MD for:  persistant nausea and vomiting   Complete by: As directed    Call MD for:  temperature >100.4   Complete by: As directed    Diet - low sodium heart healthy   Complete by: As directed    Diet Carb Modified   Complete by: As directed    Discharge instructions   Complete by: As directed    1)Please take prescribed medication as instructed. 2)Infection Prevention Recommendations for Individuals  Confirmed to have, or Being  Evaluated for, 2019 Novel Coronavirus (COVID-19) Infection Who Receive Care at Home  Individuals who are confirmed to have, or are being evaluated for, COVID-19 should follow the prevention steps below until a healthcare provider or local or state health department says they can return to normal activities.  Stay home except to get medical care You should restrict activities outside your home, except for getting medical care. Do not go to work, school, or public areas, and do not use public transportation or taxis.  Call ahead before visiting your doctor Before your medical appointment, call the healthcare provider and tell them that you have, or are being evaluated for, COVID-19 infection. This will help the healthcare provider's office take steps to keep other people from getting infected. Ask your healthcare provider to call the local or state health department.  Monitor your symptoms Seek prompt medical attention if your illness is worsening (e.g., difficulty breathing). Before going to your medical appointment, call the healthcare provider and tell them that you have, or are being evaluated for, COVID-19 infection. Ask your healthcare provider to call the local or state health department.  Wear a facemask You should wear a facemask that covers your nose and mouth when you are in the same room with other people and when you visit a healthcare provider. People who live with or visit you should also wear a facemask while they are in the same room with you.  Separate yourself from other people in your home As much as possible, you should stay in a different room from other people in your home. Also, you should use a separate bathroom, if available.  Avoid sharing household items You should not share dishes, drinking glasses, cups, eating utensils, towels, bedding, or other items with other people in your home. After using these items, you should wash them thoroughly with soap and  water.  Cover your coughs and sneezes Cover your mouth and nose with a tissue when you cough or sneeze, or you can cough or sneeze into your sleeve. Throw used tissues in a lined trash can, and immediately wash your hands with soap and water for at least 20 seconds or use an alcohol-based hand rub.  Wash your [facility] your hands often and thoroughly with soap and water for at least 20 seconds. You can use an alcohol-based hand sanitizer if soap and water are not available and if your hands are not visibly dirty. Avoid touching your eyes, nose, and mouth with unwashed hands.   Prevention Steps for Caregivers and Household Members of Individuals Confirmed to have, or Being Evaluated for, COVID-19 Infection Being Cared for in the Home  If you live with, or provide care at home for, a person confirmed to have, or being evaluated for, COVID-19 infection please follow these guidelines to prevent infection:  Follow healthcare provider's instructions Make sure that you understand and can help the patient follow any healthcare provider instructions for all care.  Provide for the patient's basic needs You should help the patient with basic needs in the home and provide support for getting groceries, prescriptions, and other personal needs.  Monitor the patient's symptoms If they are getting sicker, call his or her medical provider and tell them that the patient has, or is being evaluated for, COVID-19 infection. This will help the healthcare provider's office take steps to keep other people from getting infected. Ask the healthcare provider to call the local or state health department.  Limit  the number of people who have contact with the patient If possible, have only one caregiver for the patient. Other household members should stay in another home or place of residence. If this is not possible, they should stay in another room, or be separated from the patient as much as possible. Use a  separate bathroom, if available. Restrict visitors who do not have an essential need to be in the home.  Keep older adults, very young children, and other sick people away from the patient Keep older adults, very young children, and those who have compromised immune systems or chronic health conditions away from the patient. This includes people with chronic heart, lung, or kidney conditions, diabetes, and cancer.  Ensure good ventilation Make sure that shared spaces in the home have good air flow, such as from an air conditioner or an opened window, weather permitting.  Wash your hands often Wash your hands often and thoroughly with soap and water for at least 20 seconds. You can use an alcohol based hand sanitizer if soap and water are not available and if your hands are not visibly dirty. Avoid touching your eyes, nose, and mouth with unwashed hands. Use disposable paper towels to dry your hands. If not available, use dedicated cloth towels and replace them when they become wet.  Wear a facemask and gloves Wear a disposable facemask at all times in the room and gloves when you touch or have contact with the patient's blood, body fluids, and/or secretions or excretions, such as sweat, saliva, sputum, nasal mucus, vomit, urine, or feces.  Ensure the mask fits over your nose and mouth tightly, and do not touch it during use. Throw out disposable facemasks and gloves after using them. Do not reuse. Wash your hands immediately after removing your facemask and gloves. If your personal clothing becomes contaminated, carefully remove clothing and launder. Wash your hands after handling contaminated clothing. Place all used disposable facemasks, gloves, and other waste in a lined container before disposing them with other household waste. Remove gloves and wash your hands immediately after handling these items.  Do not share dishes, glasses, or other household items with the patient Avoid sharing  household items. You should not share dishes, drinking glasses, cups, eating utensils, towels, bedding, or other items with a patient who is confirmed to have, or being evaluated for, COVID-19 infection. After the person uses these items, you should wash them thoroughly with soap and water.  Wash laundry thoroughly Immediately remove and wash clothes or bedding that have blood, body fluids, and/or secretions or excretions, such as sweat, saliva, sputum, nasal mucus, vomit, urine, or feces, on them. Wear gloves when handling laundry from the patient. Read and follow directions on labels of laundry or clothing items and detergent. In general, wash and dry with the warmest temperatures recommended on the label.  Clean all areas the individual has used often Clean all touchable surfaces, such as counters, tabletops, doorknobs, bathroom fixtures, toilets, phones, keyboards, tablets, and bedside tables, every day. Also, clean any surfaces that may have blood, body fluids, and/or secretions or excretions on them. Wear gloves when cleaning surfaces the patient has come in contact with. Use a diluted bleach solution (e.g., dilute bleach with 1 part bleach and 10 parts water) or a household disinfectant with a label that says EPA-registered for coronaviruses. To make a bleach solution at home, add 1 tablespoon of bleach to 1 quart (4 cups) of water. For a larger supply, add  cup of  bleach to 1 gallon (16 cups) of water. Read labels of cleaning products and follow recommendations provided on product labels. Labels contain instructions for safe and effective use of the cleaning product including precautions you should take when applying the product, such as wearing gloves or eye protection and making sure you have good ventilation during use of the product. Remove gloves and wash hands immediately after cleaning.  Monitor yourself for signs and symptoms of illness Caregivers and household members are considered  close contacts, should monitor their health, and will be asked to limit movement outside of the home to the extent possible. Follow the monitoring steps for close contacts listed on the symptom monitoring form.   ? If you have additional questions, contact your local health department or call the epidemiologist on call at [phone] (available 24/7). ? This guidance is subject to change. For the most up-to-date guidance from [facility], please refer to their website: YouBlogs.pl   Increase activity slowly   Complete by: As directed    Increase activity slowly   Complete by: As directed      Allergies as of 10/04/2019      Reactions   No Known Allergies       Medication List    TAKE these medications   Accu-Chek Aviva Plus test strip Generic drug: glucose blood TEST ONCE DAILY (MORNING  FASTING)   Accu-Chek Aviva Soln   Accu-Chek FastClix Lancet Kit 1 Units by Does not apply route daily before breakfast.   Accu-Chek FastClix Lancets Misc   apixaban 5 MG Tabs tablet Commonly known as: ELIQUIS Take 2 tablets (10 mg total) by mouth 2 (two) times daily for 2 days.   apixaban 5 MG Tabs tablet Commonly known as: ELIQUIS Take 1 tablet (5 mg total) by mouth 2 (two) times daily. Start taking on: Oct 07, 2019   ascorbic acid 500 MG tablet Commonly known as: VITAMIN C Take 1 tablet (500 mg total) by mouth daily for 7 days. Start taking on: Oct 05, 2019   aspirin 81 MG tablet Take 81 mg by mouth daily.   baclofen 20 MG tablet Commonly known as: LIORESAL TAKE 1 TABLET BY MOUTH 3  TIMES DAILY   benzonatate 100 MG capsule Commonly known as: Tessalon Perles Take 1 capsule (100 mg total) by mouth every 6 (six) hours as needed for cough.   cloNIDine 0.1 MG tablet Commonly known as: CATAPRES Take 1 tablet (0.1 mg total) by mouth 2 (two) times daily.   Depend Adjustable Underwear Lg Misc Use as needed.   ferrous  sulfate 325 (65 FE) MG tablet Take 1 tablet (325 mg total) by mouth daily with breakfast.   finasteride 5 MG tablet Commonly known as: PROSCAR Take 1 tablet (5 mg total) by mouth daily.   metFORMIN 850 MG tablet Commonly known as: GLUCOPHAGE Take 1 tablet (850 mg total) by mouth 2 (two) times daily with a meal. Need office visit for additional refills What changed: additional instructions   multivitamin tablet Take 1 tablet by mouth daily.   Olmesartan-amLODIPine-HCTZ 40-10-12.5 MG Tabs TAKE 1 TABLET BY MOUTH  DAILY   oxybutynin 5 MG 24 hr tablet Commonly known as: DITROPAN-XL Take 1 tablet (5 mg total) by mouth at bedtime.   pregabalin 75 MG capsule Commonly known as: LYRICA Take 1 capsule (75 mg total) by mouth 2 (two) times daily.   traZODone 50 MG tablet Commonly known as: DESYREL Take 1 tablet (50 mg total) by mouth at bedtime.  zinc sulfate 220 (50 Zn) MG capsule Take 1 capsule (220 mg total) by mouth daily for 7 days. Start taking on: Oct 05, 2019       Allergies  Allergen Reactions  . No Known Allergies     Consultations:  None   Procedures/Studies: CT ANGIO CHEST PE W OR WO CONTRAST  Result Date: 09/25/2019 CLINICAL DATA:  PE suspected, high prob. COVID positive. EXAM: CT ANGIOGRAPHY CHEST WITH CONTRAST TECHNIQUE: Multidetector CT imaging of the chest was performed using the standard protocol during bolus administration of intravenous contrast. Multiplanar CT image reconstructions and MIPs were obtained to evaluate the vascular anatomy. CONTRAST:  73m OMNIPAQUE IOHEXOL 350 MG/ML SOLN COMPARISON:  Radiograph 09/23/2019. FINDINGS: Cardiovascular: Breathing motion artifact significantly limits evaluation. Despite motion artifact, findings suspicious for segmental pulmonary emboli in the left lower lobe and subsegmental left upper lobe branches, series 4, image 48 and 43 respectively. Probable segmental right middle lobe pulmonary emboli series 4, image 44.  There is no right heart strain. Heart is normal in size. Thoracic aorta is normal in caliber. No aortic dissection. Mediastinum/Nodes: Prominent subcarinal node measuring 16 mm. There are small right hilar lymph nodes measuring up to 10 mm. No suspicious thyroid nodule. No esophageal wall thickening. Lungs/Pleura: Patchy ground-glass opacities throughout both lungs mid lower lung zone predominant. There is central bronchiectasis. Small left pleural effusion. Upper Abdomen: No acute findings. 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 Musculoskeletal: Multilevel degenerative change in the thoracic spine. Fusion of T7-T8 vertebral bodies. Review of the MIP images confirms the above findings. IMPRESSION: 1. Findings suspicious for pulmonary emboli involving the segmental and subsegmental branches of the left lower lobe, right middle lobe and lingula. Breathing motion artifact limits detailed assessment, but thromboembolic burden appears small. No evidence of right heart strain. 2. Patchy ground-glass opacities throughout both lungs mid lower lung zone predominant, consistent with COVID-19 pneumonia. 3. Small left pleural effusion. 4. Prominent subcarinal and right hilar lymph nodes are likely reactive. Critical Value/emergent results were called by telephone at the time of interpretation on 09/25/2019 at 8:42 pm to provider B MRandol Kern who verbally acknowledged these results. Electronically Signed   By: MKeith RakeM.D.   On: 09/25/2019 20:43   DG Chest Portable 1 View  Result Date: 09/23/2019 CLINICAL DATA:  Acute shortness of breath and hypoxia. EXAM: PORTABLE CHEST 1 VIEW COMPARISON:  12/10/2011 chest radiograph FINDINGS: Bilateral LOWER lung airspace disease/consolidation noted-favor infection. There may be trace bilateral pleural effusions present. No pneumothorax. The cardiomediastinal silhouette is obscured. No acute bony abnormalities are present. IMPRESSION: Bilateral  LOWER lung airspace disease/consolidation suspicious for infection. Electronically Signed   By: JMargarette CanadaM.D.   On: 09/23/2019 15:18      Subjective: Patient seen and examined at the bedside.  Hemodynamically stable for discharge today.  Discharge Exam: Vitals:   10/04/19 0441 10/04/19 0918  BP: 106/64 116/64  Pulse: (!) 56 (!) 59  Resp: 17   Temp: 98.5 F (36.9 C)   SpO2: 100%    Vitals:   10/03/19 1239 10/03/19 2154 10/04/19 0441 10/04/19 0918  BP: (!) 114/59 115/63 106/64 116/64  Pulse:  (!) 59 (!) 56 (!) 59  Resp:  15 17   Temp: 98.6 F (37 C) 99.1 F (37.3 C) 98.5 F (36.9 C)   TempSrc: Oral Oral Oral   SpO2: 96% 99% 100%   Weight:      Height:  General: Pt is alert, awake, not in acute distress Cardiovascular: RRR, S1/S2 +, no rubs, no gallops Respiratory: CTA bilaterally, no wheezing, no rhonchi Abdominal: Soft, NT, ND, bowel sounds + Extremities: no edema, no cyanosis    The results of significant diagnostics from this hospitalization (including imaging, microbiology, ancillary and laboratory) are listed below for reference.     Microbiology: Recent Results (from the past 240 hour(s))  MRSA PCR Screening     Status: None   Collection Time: 09/24/19  8:01 PM   Specimen: Nasal Mucosa; Nasopharyngeal  Result Value Ref Range Status   MRSA by PCR NEGATIVE NEGATIVE Final    Comment:        The GeneXpert MRSA Assay (FDA approved for NASAL specimens only), is one component of a comprehensive MRSA colonization surveillance program. It is not intended to diagnose MRSA infection nor to guide or monitor treatment for MRSA infections. Performed at [facility], [place] [address], [place], [place] [address]      Labs: BNP (last 3 results) No results for input(s): BNP in the last 8760 hours. Basic Metabolic Panel: Recent Labs  Lab 09/28/19 0234 09/30/19 0224  NA 138 138  K 4.7 4.6  CL 105 103  CO2 22 25  GLUCOSE 182* 144*   BUN 37* 32*  CREATININE 1.04 0.88  CALCIUM 8.6* 8.9  MG  --  1.8  PHOS  --  4.1   Liver Function Tests: Recent Labs  Lab 09/28/19 0234 09/30/19 0224  AST 52* 73*  ALT 27 48*  ALKPHOS 71 61  BILITOT 0.5 0.7  PROT 5.3* 5.4*  ALBUMIN 2.8* 2.9*   No results for input(s): LIPASE, AMYLASE in the last 168 hours. No results for input(s): AMMONIA in the last 168 hours. CBC: Recent Labs  Lab 09/28/19 0234 09/30/19 0224  WBC 7.9 8.0  NEUTROABS 6.5  --   HGB 11.7* 12.1*  HCT 37.2* 37.5*  MCV 97.4 96.9  PLT 382 447*   Cardiac Enzymes: No results for input(s): CKTOTAL, CKMB, CKMBINDEX, TROPONINI in the last 168 hours. BNP: Invalid input(s): POCBNP CBG: Recent Labs  Lab 10/03/19 1135 10/03/19 1711 10/03/19 2121 10/04/19 0743 10/04/19 0752  GLUCAP 104* 101* 150* <10* 83   D-Dimer No results for input(s): DDIMER in the last 72 hours. Hgb A1c No results for input(s): HGBA1C in the last 72 hours. Lipid Profile No results for input(s): CHOL, HDL, LDLCALC, TRIG, CHOLHDL, LDLDIRECT in the last 72 hours. Thyroid function studies No results for input(s): TSH, T4TOTAL, T3FREE, THYROIDAB in the last 72 hours.  Invalid input(s): FREET3 Anemia work up No results for input(s): VITAMINB12, FOLATE, FERRITIN, TIBC, IRON, RETICCTPCT in the last 72 hours. Urinalysis    Component Value Date/Time   COLORURINE YELLOW 06/10/2018 1122   APPEARANCEUR CLEAR 06/10/2018 1122   LABSPEC 1.015 06/10/2018 1122   PHURINE 6.5 06/10/2018 1122   GLUCOSEU NEGATIVE 06/10/2018 1122   HGBUR NEGATIVE 06/10/2018 1122   BILIRUBINUR NEGATIVE 01/01/2016 [place] 06/10/2018 1122   PROTEINUR NEGATIVE 06/10/2018 1122   UROBILINOGEN 0.2 12/10/2011 1740   NITRITE NEGATIVE 01/01/2016 1244   LEUKOCYTESUR NEGATIVE 01/01/2016 1244   Sepsis Labs Invalid input(s): PROCALCITONIN,  WBC,  LACTICIDVEN Microbiology Recent Results (from the past 240 hour(s))  MRSA PCR Screening     Status: None    Collection Time: 09/24/19  8:01 PM   Specimen: Nasal Mucosa; Nasopharyngeal  Result Value Ref Range Status   MRSA by PCR NEGATIVE NEGATIVE Final  Comment:        The GeneXpert MRSA Assay (FDA approved for NASAL specimens only), is one component of a comprehensive MRSA colonization surveillance program. It is not intended to diagnose MRSA infection nor to guide or monitor treatment for MRSA infections. Performed at [facility], [place] [address] [place], [place] [address]     Please note: You were cared for by a hospitalist during your hospital stay. Once you are discharged, your primary care physician will handle any further medical issues. Please note that NO REFILLS for any discharge medications will be authorized once you are discharged, as it is imperative that you return to your primary care physician (or establish a relationship with a primary care physician if you do not have one) for your post hospital discharge needs so that they can reassess your need for medications and monitor your lab values.    Time coordinating discharge: 40 minutes  SIGNED:   Shelly Coss, MD  [facility] 10/04/2019, 11:31 AM Pager [phone]  If 7PM-7AM, please contact night-coverage [email or web address] Password [email or web address]

## 2019-10-04 NOTE — TOC Progression Note (Signed)
Transition of Care [facility]) - Progression Note    Patient Details  Name: Larry [name] MRN: [id number] Date of Birth: [birth year]  Transition [facility] CM/SW Contact  Purcell Mouton, RN Phone Number: 10/04/2019, 12:33 PM  Clinical Narrative:     Josem Kaufmann for [facility] [id number] for 5/4-5/11, Marjean Donna, RN, [place] with [employer] [phone].   Expected Discharge Plan: Skilled Nursing Facility Barriers to Discharge: No SNF bed  Expected Discharge Plan and Services Expected Discharge Plan: [place] Choice: [place] arrangements for the past 2 months: Single Family Home Expected Discharge Date: 10/02/19                                     Social Determinants of Health (SDOH) Interventions    Readmission Risk Interventions No flowsheet data found.

## 2019-10-04 NOTE — Progress Notes (Signed)
Pt discharging to [facility] today per Dr. Tawanna Solo. Pt's IV site d/c'd and WDL. Pt's VSS. Report called to Gwyndolyn Saxon, receiving nurse at [facility]. Verbalized understanding and all questions answered. [facility] called and transport arranged. Daughter called and made aware that [facility] will be unable to transport WC. Daughter states she should be in town from [place] on either Friday or Saturday of this week and can pick up WC at that time. WC will be placed in [place] conference room. Voicemail left with daughter with instructions on where to pick up WC.

## 2019-10-04 NOTE — Plan of Care (Signed)
  Problem: Coping: Goal: Psychosocial and spiritual needs will be supported Outcome: Completed/Met   Problem: Health Behavior/Discharge Planning: Goal: Ability to manage health-related needs will improve 10/04/2019 1230 by Annie Sable, RN Outcome: Completed/Met 10/04/2019 0809 by Annie Sable, RN Outcome: Progressing   Problem: Health Behavior/Discharge Planning: Goal: Ability to manage health-related needs will improve 10/04/2019 1230 by Annie Sable, RN Outcome: Completed/Met 10/04/2019 0809 by Annie Sable, RN Outcome: Progressing

## 2019-10-04 NOTE — Plan of Care (Signed)
  Problem: Coping: Goal: Psychosocial and spiritual needs will be supported Outcome: Completed/Met   Problem: Health Behavior/Discharge Planning: Goal: Ability to manage health-related needs will improve Outcome: Progressing   Problem: Health Behavior/Discharge Planning: Goal: Ability to manage health-related needs will improve Outcome: Progressing

## 2019-10-05 DIAGNOSIS — J9601 Acute respiratory failure with hypoxia: Secondary | ICD-10-CM | POA: Diagnosis not present

## 2019-10-05 DIAGNOSIS — U071 COVID-19: Secondary | ICD-10-CM | POA: Diagnosis not present

## 2019-10-05 DIAGNOSIS — J1282 Pneumonia due to coronavirus disease 2019: Secondary | ICD-10-CM | POA: Diagnosis not present

## 2019-10-05 DIAGNOSIS — I2699 Other pulmonary embolism without acute cor pulmonale: Secondary | ICD-10-CM | POA: Diagnosis not present

## 2019-10-14 LAB — GLUCOSE, CAPILLARY: Glucose-Capillary: <10 mg/dL — CL (ref 70–99)

## 2019-10-17 DIAGNOSIS — J9601 Acute respiratory failure with hypoxia: Secondary | ICD-10-CM | POA: Diagnosis not present

## 2019-10-17 DIAGNOSIS — I2699 Other pulmonary embolism without acute cor pulmonale: Secondary | ICD-10-CM | POA: Diagnosis not present

## 2019-10-19 DIAGNOSIS — J9601 Acute respiratory failure with hypoxia: Secondary | ICD-10-CM | POA: Diagnosis not present

## 2019-10-19 DIAGNOSIS — I2699 Other pulmonary embolism without acute cor pulmonale: Secondary | ICD-10-CM | POA: Diagnosis not present

## 2019-10-19 DIAGNOSIS — G35 Multiple sclerosis: Secondary | ICD-10-CM | POA: Diagnosis not present

## 2019-10-20 ENCOUNTER — Other Ambulatory Visit: Payer: Self-pay | Admitting: Nurse Practitioner

## 2019-10-20 DIAGNOSIS — N3281 Overactive bladder: Secondary | ICD-10-CM

## 2019-10-20 DIAGNOSIS — N401 Enlarged prostate with lower urinary tract symptoms: Secondary | ICD-10-CM

## 2019-10-22 DIAGNOSIS — I2699 Other pulmonary embolism without acute cor pulmonale: Secondary | ICD-10-CM | POA: Diagnosis not present

## 2019-10-22 DIAGNOSIS — E785 Hyperlipidemia, unspecified: Secondary | ICD-10-CM | POA: Diagnosis not present

## 2019-10-22 DIAGNOSIS — N3281 Overactive bladder: Secondary | ICD-10-CM | POA: Diagnosis not present

## 2019-10-22 DIAGNOSIS — G8220 Paraplegia, unspecified: Secondary | ICD-10-CM | POA: Diagnosis not present

## 2019-10-22 DIAGNOSIS — G35 Multiple sclerosis: Secondary | ICD-10-CM | POA: Diagnosis not present

## 2019-10-22 DIAGNOSIS — R7401 Elevation of levels of liver transaminase levels: Secondary | ICD-10-CM | POA: Diagnosis not present

## 2019-10-22 DIAGNOSIS — E119 Type 2 diabetes mellitus without complications: Secondary | ICD-10-CM | POA: Diagnosis not present

## 2019-10-22 DIAGNOSIS — I10 Essential (primary) hypertension: Secondary | ICD-10-CM | POA: Diagnosis not present

## 2019-10-22 DIAGNOSIS — I248 Other forms of acute ischemic heart disease: Secondary | ICD-10-CM | POA: Diagnosis not present

## 2019-10-22 DIAGNOSIS — D473 Essential (hemorrhagic) thrombocythemia: Secondary | ICD-10-CM | POA: Diagnosis not present

## 2019-10-22 DIAGNOSIS — S24103S Unspecified injury at T7-T10 level of thoracic spinal cord, sequela: Secondary | ICD-10-CM | POA: Diagnosis not present

## 2019-10-24 ENCOUNTER — Telehealth: Payer: Self-pay | Admitting: Nurse Practitioner

## 2019-10-24 DIAGNOSIS — I10 Essential (primary) hypertension: Secondary | ICD-10-CM | POA: Diagnosis not present

## 2019-10-24 DIAGNOSIS — I2699 Other pulmonary embolism without acute cor pulmonale: Secondary | ICD-10-CM | POA: Diagnosis not present

## 2019-10-24 DIAGNOSIS — E119 Type 2 diabetes mellitus without complications: Secondary | ICD-10-CM | POA: Diagnosis not present

## 2019-10-24 DIAGNOSIS — G35 Multiple sclerosis: Secondary | ICD-10-CM | POA: Diagnosis not present

## 2019-10-24 DIAGNOSIS — E785 Hyperlipidemia, unspecified: Secondary | ICD-10-CM | POA: Diagnosis not present

## 2019-10-24 DIAGNOSIS — N3281 Overactive bladder: Secondary | ICD-10-CM | POA: Diagnosis not present

## 2019-10-24 DIAGNOSIS — S24103S Unspecified injury at T7-T10 level of thoracic spinal cord, sequela: Secondary | ICD-10-CM | POA: Diagnosis not present

## 2019-10-24 DIAGNOSIS — G8220 Paraplegia, unspecified: Secondary | ICD-10-CM | POA: Diagnosis not present

## 2019-10-24 DIAGNOSIS — I248 Other forms of acute ischemic heart disease: Secondary | ICD-10-CM | POA: Diagnosis not present

## 2019-10-24 DIAGNOSIS — R7401 Elevation of levels of liver transaminase levels: Secondary | ICD-10-CM | POA: Diagnosis not present

## 2019-10-24 DIAGNOSIS — D473 Essential (hemorrhagic) thrombocythemia: Secondary | ICD-10-CM | POA: Diagnosis not present

## 2019-10-24 NOTE — Telephone Encounter (Signed)
Charlotte please advise.  Dorian with [facility] is requesting verbal orders for therapy 3x a week for one week and then 2x a week for three weeks for strengthening, balance, and functional activity.

## 2019-10-24 NOTE — Telephone Encounter (Signed)
PT requesting verbal orders for therapy for three times a week for one week, then two times a week for three weeks. For strengthening, balance, and functional activity.  Dorian with [facility] [phone], if no answer ok to leave message.

## 2019-10-25 DIAGNOSIS — G35 Multiple sclerosis: Secondary | ICD-10-CM | POA: Diagnosis not present

## 2019-10-25 DIAGNOSIS — G8220 Paraplegia, unspecified: Secondary | ICD-10-CM | POA: Diagnosis not present

## 2019-10-25 DIAGNOSIS — I2699 Other pulmonary embolism without acute cor pulmonale: Secondary | ICD-10-CM | POA: Diagnosis not present

## 2019-10-25 DIAGNOSIS — D473 Essential (hemorrhagic) thrombocythemia: Secondary | ICD-10-CM | POA: Diagnosis not present

## 2019-10-25 DIAGNOSIS — I10 Essential (primary) hypertension: Secondary | ICD-10-CM | POA: Diagnosis not present

## 2019-10-25 DIAGNOSIS — I248 Other forms of acute ischemic heart disease: Secondary | ICD-10-CM | POA: Diagnosis not present

## 2019-10-25 DIAGNOSIS — N3281 Overactive bladder: Secondary | ICD-10-CM | POA: Diagnosis not present

## 2019-10-25 DIAGNOSIS — R7401 Elevation of levels of liver transaminase levels: Secondary | ICD-10-CM | POA: Diagnosis not present

## 2019-10-25 DIAGNOSIS — E785 Hyperlipidemia, unspecified: Secondary | ICD-10-CM | POA: Diagnosis not present

## 2019-10-25 DIAGNOSIS — E119 Type 2 diabetes mellitus without complications: Secondary | ICD-10-CM | POA: Diagnosis not present

## 2019-10-25 DIAGNOSIS — S24103S Unspecified injury at T7-T10 level of thoracic spinal cord, sequela: Secondary | ICD-10-CM | POA: Diagnosis not present

## 2019-10-25 NOTE — Telephone Encounter (Signed)
ok 

## 2019-10-26 NOTE — Telephone Encounter (Signed)
Larry [name] was contacted and given verbal Ok for orders.

## 2019-10-27 DIAGNOSIS — I248 Other forms of acute ischemic heart disease: Secondary | ICD-10-CM | POA: Diagnosis not present

## 2019-10-27 DIAGNOSIS — G35 Multiple sclerosis: Secondary | ICD-10-CM | POA: Diagnosis not present

## 2019-10-27 DIAGNOSIS — I10 Essential (primary) hypertension: Secondary | ICD-10-CM | POA: Diagnosis not present

## 2019-10-27 DIAGNOSIS — G8220 Paraplegia, unspecified: Secondary | ICD-10-CM | POA: Diagnosis not present

## 2019-10-27 DIAGNOSIS — E119 Type 2 diabetes mellitus without complications: Secondary | ICD-10-CM | POA: Diagnosis not present

## 2019-10-27 DIAGNOSIS — R7401 Elevation of levels of liver transaminase levels: Secondary | ICD-10-CM | POA: Diagnosis not present

## 2019-10-27 DIAGNOSIS — S24103S Unspecified injury at T7-T10 level of thoracic spinal cord, sequela: Secondary | ICD-10-CM | POA: Diagnosis not present

## 2019-10-27 DIAGNOSIS — E785 Hyperlipidemia, unspecified: Secondary | ICD-10-CM | POA: Diagnosis not present

## 2019-10-27 DIAGNOSIS — N3281 Overactive bladder: Secondary | ICD-10-CM | POA: Diagnosis not present

## 2019-10-27 DIAGNOSIS — I2699 Other pulmonary embolism without acute cor pulmonale: Secondary | ICD-10-CM | POA: Diagnosis not present

## 2019-10-27 DIAGNOSIS — D473 Essential (hemorrhagic) thrombocythemia: Secondary | ICD-10-CM | POA: Diagnosis not present

## 2019-10-31 ENCOUNTER — Other Ambulatory Visit: Payer: Self-pay

## 2019-11-01 ENCOUNTER — Encounter: Payer: Self-pay | Admitting: Nurse Practitioner

## 2019-11-01 ENCOUNTER — Ambulatory Visit (INDEPENDENT_AMBULATORY_CARE_PROVIDER_SITE_OTHER): Payer: Medicare Other | Admitting: Nurse Practitioner

## 2019-11-01 ENCOUNTER — Ambulatory Visit (INDEPENDENT_AMBULATORY_CARE_PROVIDER_SITE_OTHER): Payer: Medicare Other

## 2019-11-01 VITALS — BP 118/60 | HR 94 | Temp 96.6°F | Ht 67.0 in | Wt 154.0 lb

## 2019-11-01 DIAGNOSIS — J1282 Pneumonia due to coronavirus disease 2019: Secondary | ICD-10-CM

## 2019-11-01 DIAGNOSIS — I2694 Multiple subsegmental pulmonary emboli without acute cor pulmonale: Secondary | ICD-10-CM

## 2019-11-01 DIAGNOSIS — G35 Multiple sclerosis: Secondary | ICD-10-CM | POA: Diagnosis not present

## 2019-11-01 DIAGNOSIS — E1142 Type 2 diabetes mellitus with diabetic polyneuropathy: Secondary | ICD-10-CM

## 2019-11-01 DIAGNOSIS — E119 Type 2 diabetes mellitus without complications: Secondary | ICD-10-CM | POA: Diagnosis not present

## 2019-11-01 DIAGNOSIS — I2699 Other pulmonary embolism without acute cor pulmonale: Secondary | ICD-10-CM | POA: Diagnosis not present

## 2019-11-01 DIAGNOSIS — S24103S Unspecified injury at T7-T10 level of thoracic spinal cord, sequela: Secondary | ICD-10-CM | POA: Diagnosis not present

## 2019-11-01 DIAGNOSIS — G629 Polyneuropathy, unspecified: Secondary | ICD-10-CM

## 2019-11-01 DIAGNOSIS — E1151 Type 2 diabetes mellitus with diabetic peripheral angiopathy without gangrene: Secondary | ICD-10-CM

## 2019-11-01 DIAGNOSIS — E7800 Pure hypercholesterolemia, unspecified: Secondary | ICD-10-CM | POA: Diagnosis not present

## 2019-11-01 DIAGNOSIS — U071 COVID-19: Secondary | ICD-10-CM | POA: Diagnosis not present

## 2019-11-01 DIAGNOSIS — G8220 Paraplegia, unspecified: Secondary | ICD-10-CM | POA: Diagnosis not present

## 2019-11-01 DIAGNOSIS — N3281 Overactive bladder: Secondary | ICD-10-CM | POA: Diagnosis not present

## 2019-11-01 DIAGNOSIS — E785 Hyperlipidemia, unspecified: Secondary | ICD-10-CM | POA: Diagnosis not present

## 2019-11-01 DIAGNOSIS — I248 Other forms of acute ischemic heart disease: Secondary | ICD-10-CM | POA: Diagnosis not present

## 2019-11-01 DIAGNOSIS — R7401 Elevation of levels of liver transaminase levels: Secondary | ICD-10-CM | POA: Diagnosis not present

## 2019-11-01 DIAGNOSIS — I152 Hypertension secondary to endocrine disorders: Secondary | ICD-10-CM

## 2019-11-01 DIAGNOSIS — I10 Essential (primary) hypertension: Secondary | ICD-10-CM | POA: Diagnosis not present

## 2019-11-01 DIAGNOSIS — D473 Essential (hemorrhagic) thrombocythemia: Secondary | ICD-10-CM | POA: Diagnosis not present

## 2019-11-01 LAB — COMPREHENSIVE METABOLIC PANEL
ALT: 9 U/L (ref 0–53)
AST: 19 U/L (ref 0–37)
Albumin: 4.6 g/dL (ref 3.5–5.2)
Alkaline Phosphatase: 66 U/L (ref 39–117)
BUN: 15 mg/dL (ref 6–23)
CO2: 29 mEq/L (ref 19–32)
Calcium: 9.6 mg/dL (ref 8.4–10.5)
Chloride: 103 mEq/L (ref 96–112)
Creatinine, Ser: 0.86 mg/dL (ref 0.40–1.50)
GFR: 87.61 mL/min (ref >60.00)
Glucose, Bld: 117 mg/dL — ABNORMAL HIGH (ref 70–99)
Potassium: 4.1 mEq/L (ref 3.5–5.1)
Sodium: 140 mEq/L (ref 135–145)
Total Bilirubin: 0.4 mg/dL (ref 0.2–1.2)
Total Protein: 6.4 g/dL (ref 6.0–8.3)

## 2019-11-01 LAB — CBC WITH DIFFERENTIAL/PLATELET
Basophils Absolute: 0 10*3/uL (ref 0.0–0.1)
Basophils Relative: 1.5 % (ref 0.0–3.0)
Eosinophils Absolute: 0.1 10*3/uL (ref 0.0–0.7)
Eosinophils Relative: 4.3 % (ref 0.0–5.0)
HCT: 35 % — ABNORMAL LOW (ref 39.0–52.0)
Hemoglobin: 11.7 g/dL — ABNORMAL LOW (ref 13.0–17.0)
Lymphocytes Relative: 30.7 % (ref 12.0–46.0)
Lymphs Abs: 0.6 10*3/uL — ABNORMAL LOW (ref 0.7–4.0)
MCHC: 33.4 g/dL (ref 30.0–36.0)
MCV: 94.8 fl (ref 78.0–100.0)
Monocytes Absolute: 0.2 10*3/uL (ref 0.1–1.0)
Monocytes Relative: 8.9 % (ref 3.0–12.0)
Neutro Abs: 1.1 10*3/uL — ABNORMAL LOW (ref 1.4–7.7)
Neutrophils Relative %: 54.6 % (ref 43.0–77.0)
Platelets: 247 10*3/uL (ref 150.0–400.0)
RBC: 3.69 Mil/uL — ABNORMAL LOW (ref 4.22–5.81)
RDW: 16.3 % — ABNORMAL HIGH (ref 11.5–15.5)
WBC: 2.1 10*3/uL — ABNORMAL LOW (ref 4.0–10.5)

## 2019-11-01 LAB — LIPID PANEL
Cholesterol: 176 mg/dL (ref 0–200)
HDL: 49.5 mg/dL (ref >39.00)
LDL Cholesterol: 113 mg/dL — ABNORMAL HIGH (ref 0–99)
NonHDL: 126.57
Total CHOL/HDL Ratio: 4
Triglycerides: 69 mg/dL (ref 0.0–149.0)
VLDL: 13.8 mg/dL (ref 0.0–40.0)

## 2019-11-01 LAB — C-REACTIVE PROTEIN: CRP: <1 mg/dL (ref 0.5–20.0)

## 2019-11-01 LAB — HEMOGLOBIN A1C: Hgb A1c MFr Bld: 6.1 % (ref 4.6–6.5)

## 2019-11-01 NOTE — Progress Notes (Signed)
 Subjective:  Patient ID: Larry [name], male    DOB: 02/26/1951  Age: 69 y.o. MRN: [id number]  CC: Hospitalization Follow-up (follow up from Acute resp failure-Covid-23 days in hospital//admited 5/24//no covid vaccine-p has questions about geting it due to MS//due for eye appiointment)  HPI Admitted with COVID pneumonia and bilateral PE: 4/24 to 5/5. He was discharged to SNF for rehab. He was discharged home 10/20/2019 with orders for nursing, PT, OT and speech therapy(these services have been initiated per patient). He is also assisted by his daughter Today he reports intermittent cough at bedtime, but no fever or CP or SOB or hematuria or hematochezia or melena or nausea or ABD pain.  Reconciled medication list with patient. Reviewed hospital and SNF discharge summary. Also reviewed radiology reports and lab results.  HTN:  BP at goal with clonidine, amlodipine, olmesartan, and HCTZ BP Readings from Last 3 Encounters:  11/01/19 118/60  10/04/19 101/70  09/14/19 118/69   DM: Controlled with metformin LDL not at goal, no statin at this time Neuropathy: multifactorial. Use of lyrica  Reviewed past Medical, Social and Family history today.  Outpatient Medications Prior to Visit  Medication Sig Dispense Refill  . Accu-Chek FastClix Lancets MISC     . apixaban (ELIQUIS) 5 MG TABS tablet Take 1 tablet (5 mg total) by mouth 2 (two) times daily. 60 tablet 1  . baclofen (LIORESAL) 20 MG tablet TAKE 1 TABLET BY MOUTH 3  TIMES DAILY 270 tablet 3  . benzonatate (TESSALON PERLES) 100 MG capsule Take 1 capsule (100 mg total) by mouth every 6 (six) hours as needed for cough. 30 capsule 0  . Blood Glucose Calibration (ACCU-CHEK AVIVA) SOLN     . finasteride (PROSCAR) 5 MG tablet Take 1 tablet (5 mg total) by mouth daily. 90 tablet 3  . Incontinence Supply Disposable (DEPEND ADJUSTABLE UNDERWEAR LG) MISC Use as needed. 30 each 12  . Lancets Misc. (ACCU-CHEK FASTCLIX LANCET) KIT 1 Units by Does  not apply route daily before breakfast. 1 kit 11  . Multiple Vitamin (MULTIVITAMIN) tablet Take 1 tablet by mouth daily.    . pregabalin (LYRICA) 75 MG capsule Take 1 capsule (75 mg total) by mouth 2 (two) times daily. 180 capsule 3  . traZODone (DESYREL) 50 MG tablet Take 1 tablet (50 mg total) by mouth at bedtime. 90 tablet 3  . ACCU-CHEK AVIVA PLUS test strip TEST ONCE DAILY (MORNING  FASTING) 100 each 3  . cloNIDine (CATAPRES) 0.1 MG tablet Take 1 tablet (0.1 mg total) by mouth 2 (two) times daily. 180 tablet 3  . metFORMIN (GLUCOPHAGE) 850 MG tablet Take 1 tablet (850 mg total) by mouth 2 (two) times daily with a meal. Need office visit for additional refills (Patient taking differently: Take 850 mg by mouth 2 (two) times daily with a meal. ) 180 tablet 1  . Olmesartan-amLODIPine-HCTZ 40-10-12.5 MG TABS TAKE 1 TABLET BY MOUTH  DAILY 90 tablet 3  . oxybutynin (DITROPAN-XL) 5 MG 24 hr tablet Take 1 tablet (5 mg total) by mouth at bedtime. 30 tablet 5  . aspirin 81 MG tablet Take 81 mg by mouth daily.    . ferrous sulfate 325 (65 FE) MG tablet Take 1 tablet (325 mg total) by mouth daily with breakfast. (Patient not taking: Reported on 11/01/2019) 90 tablet 3  . omeprazole (PRILOSEC) 20 MG capsule Take 20 mg by mouth daily.    . apixaban (ELIQUIS) 5 MG TABS tablet Take 2 tablets (10 mg total)   by mouth 2 (two) times daily for 2 days. 8 tablet 0   No facility-administered medications prior to visit.    ROS See HPI  Objective:  BP 118/60   Pulse 94   Temp (!) 96.6 F (35.9 C) (Tympanic)   Ht 5' 7" (1.702 m)   Wt 154 lb (69.9 kg)   SpO2 95%   BMI 24.12 kg/m   BP Readings from Last 3 Encounters:  11/01/19 118/60  10/04/19 101/70  09/14/19 118/69    Wt Readings from Last 3 Encounters:  11/01/19 154 lb (69.9 kg)  09/27/19 158 lb 11.7 oz (72 kg)  09/14/19 165 lb (74.8 kg)    Physical Exam Cardiovascular:     Rate and Rhythm: Normal rate and regular rhythm.     Pulses: Normal  pulses.     Heart sounds: Normal heart sounds.  Pulmonary:     Effort: Pulmonary effort is normal.     Breath sounds: Normal breath sounds.  Abdominal:     General: Bowel sounds are normal.     Palpations: Abdomen is soft.     Tenderness: There is no abdominal tenderness.  Musculoskeletal:     Right lower leg: No edema.     Left lower leg: No edema.  Neurological:     Mental Status: He is alert and oriented to person, place, and time.  Psychiatric:        Mood and Affect: Mood normal.        Behavior: Behavior normal.        Thought Content: Thought content normal.    Lab Results  Component Value Date   WBC 2.1 Repeated and verified X2. (L) 11/01/2019   HGB 11.7 (L) 11/01/2019   HCT 35.0 (L) 11/01/2019   PLT 247.0 11/01/2019   GLUCOSE 117 (H) 11/01/2019   CHOL 176 11/01/2019   TRIG 69.0 11/01/2019   HDL 49.50 11/01/2019   LDLCALC 113 (H) 11/01/2019   ALT 9 11/01/2019   AST 19 11/01/2019   NA 140 11/01/2019   K 4.1 11/01/2019   CL 103 11/01/2019   CREATININE 0.86 11/01/2019   BUN 15 11/01/2019   CO2 29 11/01/2019   TSH 1.46 03/24/2019   PSA 0.52 06/10/2018   INR 1.01 02/16/2011   HGBA1C 6.1 11/01/2019   MICROALBUR 4.7 (H) 07/27/2016    DG Chest Portable 1 View  Result Date: 09/23/2019 CLINICAL DATA:  Acute shortness of breath and hypoxia. EXAM: PORTABLE CHEST 1 VIEW COMPARISON:  12/10/2011 chest radiograph FINDINGS: Bilateral LOWER lung airspace disease/consolidation noted-favor infection. There may be trace bilateral pleural effusions present. No pneumothorax. The cardiomediastinal silhouette is obscured. No acute bony abnormalities are present. IMPRESSION: Bilateral LOWER lung airspace disease/consolidation suspicious for infection. Electronically Signed   By: Jeffrey  Hu M.D.   On: 09/23/2019 15:18    Assessment & Plan:  This visit occurred during the SARS-CoV-2 public health emergency.  Safety protocols were in place, including screening questions prior to the  visit, additional usage of staff PPE, and extensive cleaning of exam room while observing appropriate contact time as indicated for disinfecting solutions.   Martez was seen today for hospitalization follow-up.  Diagnoses and all orders for this visit:  Type 2 diabetes mellitus with diabetic polyneuropathy, without long-term current use of insulin (HCC) -     Comprehensive metabolic panel -     Hemoglobin A1c  Pneumonia due to COVID-19 virus -     CBC w/Diff -     DG   Chest 2 View -     C-reactive protein -     D-Dimer, Quantitative -     CT CHEST W CONTRAST; Future  Elevated LDL cholesterol level -     Lipid panel  Multiple subsegmental pulmonary emboli without acute cor pulmonale (HCC) -     DG Chest 2 View -     C-reactive protein -     D-Dimer, Quantitative -     CT CHEST W CONTRAST; Future  Peripheral polyneuropathy -     cloNIDine (CATAPRES) 0.1 MG tablet; Take 1 tablet (0.1 mg total) by mouth 2 (two) times daily.  Hypertension due to endocrine disorder -     cloNIDine (CATAPRES) 0.1 MG tablet; Take 1 tablet (0.1 mg total) by mouth 2 (two) times daily. -     Olmesartan-amLODIPine-HCTZ 40-10-12.5 MG TABS; Take 1 tablet by mouth daily.  Controlled type 2 DM with peripheral circulatory disorder (HCC) -     metFORMIN (GLUCOPHAGE) 850 MG tablet; Take 1 tablet (850 mg total) by mouth 2 (two) times daily with a meal.   I have changed Chrissie Noa Caldron's metFORMIN and Olmesartan-amLODIPine-HCTZ. I am also having him maintain his multivitamin, aspirin, Accu-Chek Aviva, Depend Adjustable Underwear Lg, Accu-Chek FastClix Lancet, Accu-Chek FastClix Lancets, baclofen, finasteride, ferrous sulfate, traZODone, pregabalin, benzonatate, apixaban, omeprazole, and cloNIDine.  Meds ordered this encounter  Medications  . cloNIDine (CATAPRES) 0.1 MG tablet    Sig: Take 1 tablet (0.1 mg total) by mouth 2 (two) times daily.    Dispense:  180 tablet    Refill:  3    Order Specific Question:    Supervising Provider    Answer:   Ronnald Nian [[id number]]  . metFORMIN (GLUCOPHAGE) 850 MG tablet    Sig: Take 1 tablet (850 mg total) by mouth 2 (two) times daily with a meal.    Dispense:  180 tablet    Refill:  3    Order Specific Question:   Supervising Provider    Answer:   Ronnald Nian [[id number]]  . Olmesartan-amLODIPine-HCTZ 40-10-12.5 MG TABS    Sig: Take 1 tablet by mouth daily.    Dispense:  90 tablet    Refill:  3    Requesting 1 year supply    Order Specific Question:   Supervising Provider    Answer:   Ronnald Nian [[id number]]    Problem List Items Addressed This Visit      Cardiovascular and Mediastinum   Hypertension   Relevant Medications   cloNIDine (CATAPRES) 0.1 MG tablet   Olmesartan-amLODIPine-HCTZ 40-10-12.5 MG TABS   Multiple subsegmental pulmonary emboli without acute cor pulmonale (HCC)    Provoked, due to COVID infection Asymptomatic at this time eliquis started during hospitalization 04/24-05/05/21,  need to continue for 68month Repeat CT chest 11/2019.      Relevant Medications   cloNIDine (CATAPRES) 0.1 MG tablet   Olmesartan-amLODIPine-HCTZ 40-10-12.5 MG TABS   Other Relevant Orders   DG Chest 2 View (Completed)   C-reactive protein (Completed)   D-Dimer, Quantitative (Completed)   CT CHEST W CONTRAST     Endocrine   DM (diabetes mellitus) (HCC) - Primary   Relevant Medications   metFORMIN (GLUCOPHAGE) 850 MG tablet   Olmesartan-amLODIPine-HCTZ 40-10-12.5 MG TABS   Other Relevant Orders   Comprehensive metabolic panel (Completed)   Hemoglobin A1c (Completed)     Nervous and Auditory   Peripheral neuropathy   Relevant Medications   cloNIDine (CATAPRES) 0.1 MG tablet  Other   Elevated LDL cholesterol level   Relevant Orders   Lipid panel (Completed)    Other Visit Diagnoses    Pneumonia due to COVID-19 virus       Relevant Orders   CBC w/Diff (Completed)   DG Chest 2 View (Completed)   C-reactive protein  (Completed)   D-Dimer, Quantitative (Completed)   CT CHEST W CONTRAST   Controlled type 2 DM with peripheral circulatory disorder (HCC)       Relevant Medications   cloNIDine (CATAPRES) 0.1 MG tablet   metFORMIN (GLUCOPHAGE) 850 MG tablet   Olmesartan-amLODIPine-HCTZ 40-10-12.5 MG TABS      Follow-up: Return in about 3 months (around 02/01/2020) for DM and HTN, hyperlipidemia (30mins).   , NP 

## 2019-11-01 NOTE — Assessment & Plan Note (Addendum)
Provoked, due to COVID infection Asymptomatic at this time eliquis started during hospitalization 04/24-05/05/21, needs to complete 91months regimen Repeat CT chest 11/2019. Improved CXR, normal D-dimer and CRP

## 2019-11-01 NOTE — Assessment & Plan Note (Signed)
Mild intermittent cough at HS No SOB or chest pain or fever. Need repeat CXR and labs today.

## 2019-11-01 NOTE — Patient Instructions (Addendum)
Stable CMP and hgbA1c at 6.1 Normal D-dimer and CRP. CXR indicates resolving pneumonia. Continue eliquis for another 60days, then will need to repeat CT chest to re val PE. Order entered. CBC indicates stable but persistent anemia and new low WBC. Need to repeat cbc in 61month. Stable lab appt. Abnormal lipid panel: elevated LDL. I recommend use of statin like atorvastatin or crestor. Ask if he is will to start this?  Continue eliquis for another 30days due to bilateral PE. Need to repeat CT chest in 11/2019.  You can get COVID vaccine in 60days.  Schedule annual eye exam.

## 2019-11-02 DIAGNOSIS — I10 Essential (primary) hypertension: Secondary | ICD-10-CM | POA: Diagnosis not present

## 2019-11-02 DIAGNOSIS — I248 Other forms of acute ischemic heart disease: Secondary | ICD-10-CM | POA: Diagnosis not present

## 2019-11-02 DIAGNOSIS — G35 Multiple sclerosis: Secondary | ICD-10-CM | POA: Diagnosis not present

## 2019-11-02 DIAGNOSIS — E785 Hyperlipidemia, unspecified: Secondary | ICD-10-CM | POA: Diagnosis not present

## 2019-11-02 DIAGNOSIS — D473 Essential (hemorrhagic) thrombocythemia: Secondary | ICD-10-CM | POA: Diagnosis not present

## 2019-11-02 DIAGNOSIS — N3281 Overactive bladder: Secondary | ICD-10-CM | POA: Diagnosis not present

## 2019-11-02 DIAGNOSIS — R7401 Elevation of levels of liver transaminase levels: Secondary | ICD-10-CM | POA: Diagnosis not present

## 2019-11-02 DIAGNOSIS — I2699 Other pulmonary embolism without acute cor pulmonale: Secondary | ICD-10-CM | POA: Diagnosis not present

## 2019-11-02 DIAGNOSIS — G8220 Paraplegia, unspecified: Secondary | ICD-10-CM | POA: Diagnosis not present

## 2019-11-02 DIAGNOSIS — E119 Type 2 diabetes mellitus without complications: Secondary | ICD-10-CM | POA: Diagnosis not present

## 2019-11-02 DIAGNOSIS — S24103S Unspecified injury at T7-T10 level of thoracic spinal cord, sequela: Secondary | ICD-10-CM | POA: Diagnosis not present

## 2019-11-02 LAB — D-DIMER, QUANTITATIVE: D-Dimer, Quant: 0.46 mcg/mL FEU (ref <0.50)

## 2019-11-03 ENCOUNTER — Telehealth: Payer: Self-pay | Admitting: Nurse Practitioner

## 2019-11-03 DIAGNOSIS — D473 Essential (hemorrhagic) thrombocythemia: Secondary | ICD-10-CM | POA: Diagnosis not present

## 2019-11-03 DIAGNOSIS — S24103S Unspecified injury at T7-T10 level of thoracic spinal cord, sequela: Secondary | ICD-10-CM | POA: Diagnosis not present

## 2019-11-03 DIAGNOSIS — E785 Hyperlipidemia, unspecified: Secondary | ICD-10-CM | POA: Diagnosis not present

## 2019-11-03 DIAGNOSIS — R7401 Elevation of levels of liver transaminase levels: Secondary | ICD-10-CM | POA: Diagnosis not present

## 2019-11-03 DIAGNOSIS — G8220 Paraplegia, unspecified: Secondary | ICD-10-CM | POA: Diagnosis not present

## 2019-11-03 DIAGNOSIS — E119 Type 2 diabetes mellitus without complications: Secondary | ICD-10-CM | POA: Diagnosis not present

## 2019-11-03 DIAGNOSIS — I248 Other forms of acute ischemic heart disease: Secondary | ICD-10-CM | POA: Diagnosis not present

## 2019-11-03 DIAGNOSIS — G35 Multiple sclerosis: Secondary | ICD-10-CM | POA: Diagnosis not present

## 2019-11-03 DIAGNOSIS — I10 Essential (primary) hypertension: Secondary | ICD-10-CM | POA: Diagnosis not present

## 2019-11-03 DIAGNOSIS — I2699 Other pulmonary embolism without acute cor pulmonale: Secondary | ICD-10-CM | POA: Diagnosis not present

## 2019-11-03 DIAGNOSIS — N3281 Overactive bladder: Secondary | ICD-10-CM | POA: Diagnosis not present

## 2019-11-03 NOTE — Telephone Encounter (Signed)
Patient is calling back for lab results. CB is [phone]

## 2019-11-03 NOTE — Telephone Encounter (Signed)
Left pt a voicemail to return my call if any questions or concerns about his labs he viewed on My Chart.

## 2019-11-04 ENCOUNTER — Other Ambulatory Visit: Payer: Self-pay | Admitting: Nurse Practitioner

## 2019-11-04 DIAGNOSIS — E118 Type 2 diabetes mellitus with unspecified complications: Secondary | ICD-10-CM

## 2019-11-05 ENCOUNTER — Other Ambulatory Visit: Payer: Self-pay | Admitting: Nurse Practitioner

## 2019-11-05 DIAGNOSIS — N401 Enlarged prostate with lower urinary tract symptoms: Secondary | ICD-10-CM

## 2019-11-05 DIAGNOSIS — N3281 Overactive bladder: Secondary | ICD-10-CM

## 2019-11-06 ENCOUNTER — Telehealth: Payer: Self-pay | Admitting: Nurse Practitioner

## 2019-11-06 NOTE — Telephone Encounter (Signed)
Patient is calling and requesting a refill for eliquis sent to [facility] on [address]. CB is [phone]

## 2019-11-08 DIAGNOSIS — I248 Other forms of acute ischemic heart disease: Secondary | ICD-10-CM | POA: Diagnosis not present

## 2019-11-08 DIAGNOSIS — N3281 Overactive bladder: Secondary | ICD-10-CM | POA: Diagnosis not present

## 2019-11-08 DIAGNOSIS — D473 Essential (hemorrhagic) thrombocythemia: Secondary | ICD-10-CM | POA: Diagnosis not present

## 2019-11-08 DIAGNOSIS — E119 Type 2 diabetes mellitus without complications: Secondary | ICD-10-CM | POA: Diagnosis not present

## 2019-11-08 DIAGNOSIS — I2699 Other pulmonary embolism without acute cor pulmonale: Secondary | ICD-10-CM | POA: Diagnosis not present

## 2019-11-08 DIAGNOSIS — G8220 Paraplegia, unspecified: Secondary | ICD-10-CM | POA: Diagnosis not present

## 2019-11-08 DIAGNOSIS — R7401 Elevation of levels of liver transaminase levels: Secondary | ICD-10-CM | POA: Diagnosis not present

## 2019-11-08 DIAGNOSIS — S24103S Unspecified injury at T7-T10 level of thoracic spinal cord, sequela: Secondary | ICD-10-CM | POA: Diagnosis not present

## 2019-11-08 DIAGNOSIS — G35 Multiple sclerosis: Secondary | ICD-10-CM | POA: Diagnosis not present

## 2019-11-08 DIAGNOSIS — I10 Essential (primary) hypertension: Secondary | ICD-10-CM | POA: Diagnosis not present

## 2019-11-08 DIAGNOSIS — E785 Hyperlipidemia, unspecified: Secondary | ICD-10-CM | POA: Diagnosis not present

## 2019-11-08 NOTE — Telephone Encounter (Signed)
Charlotte please advise.  Pt was needing a refill for Eliquis but should have a refill so [place] was contacted and stated that a lot of pt's medications are on hold due to directions. Eliquis was on hold due to high dose or what they say is not a normal dosage and that theres a interaction with Meloxicam. Without ins. It cost $188.

## 2019-11-08 NOTE — Telephone Encounter (Signed)
Talked to [facility] pharmacist today. He has 2 eliquis rx and they needed clarification on which one to fill. I informed the pharmacist to fill 5mg  BID. He has an additional refill, then he should be done with this prescription. For his other medications, he need to call the pharmacy and refills by name.

## 2019-11-09 ENCOUNTER — Encounter: Payer: Self-pay | Admitting: Nurse Practitioner

## 2019-11-09 DIAGNOSIS — E7800 Pure hypercholesterolemia, unspecified: Secondary | ICD-10-CM | POA: Insufficient documentation

## 2019-11-09 DIAGNOSIS — E1169 Type 2 diabetes mellitus with other specified complication: Secondary | ICD-10-CM | POA: Insufficient documentation

## 2019-11-09 MED ORDER — OLMESARTAN-AMLODIPINE-HCTZ 40-10-12.5 MG PO TABS: 1.0000 | ORAL_TABLET | Freq: Every day | ORAL | 3 refills | Status: DC

## 2019-11-09 MED ORDER — METFORMIN HCL 850 MG PO TABS: 850.0000 mg | ORAL_TABLET | Freq: Two times a day (BID) | ORAL | 3 refills | Status: DC

## 2019-11-09 MED ORDER — CLONIDINE HCL 0.1 MG PO TABS: 0.1000 mg | ORAL_TABLET | Freq: Two times a day (BID) | ORAL | 3 refills | Status: DC

## 2019-11-09 NOTE — Telephone Encounter (Signed)
Left pt a voicemail to give [place] a call back.

## 2019-11-10 DIAGNOSIS — R7401 Elevation of levels of liver transaminase levels: Secondary | ICD-10-CM | POA: Diagnosis not present

## 2019-11-10 DIAGNOSIS — S24103S Unspecified injury at T7-T10 level of thoracic spinal cord, sequela: Secondary | ICD-10-CM | POA: Diagnosis not present

## 2019-11-10 DIAGNOSIS — I2699 Other pulmonary embolism without acute cor pulmonale: Secondary | ICD-10-CM | POA: Diagnosis not present

## 2019-11-10 DIAGNOSIS — I248 Other forms of acute ischemic heart disease: Secondary | ICD-10-CM | POA: Diagnosis not present

## 2019-11-10 DIAGNOSIS — E119 Type 2 diabetes mellitus without complications: Secondary | ICD-10-CM | POA: Diagnosis not present

## 2019-11-10 DIAGNOSIS — E785 Hyperlipidemia, unspecified: Secondary | ICD-10-CM | POA: Diagnosis not present

## 2019-11-10 DIAGNOSIS — I10 Essential (primary) hypertension: Secondary | ICD-10-CM | POA: Diagnosis not present

## 2019-11-10 DIAGNOSIS — G8220 Paraplegia, unspecified: Secondary | ICD-10-CM | POA: Diagnosis not present

## 2019-11-10 DIAGNOSIS — G35 Multiple sclerosis: Secondary | ICD-10-CM | POA: Diagnosis not present

## 2019-11-10 DIAGNOSIS — N3281 Overactive bladder: Secondary | ICD-10-CM | POA: Diagnosis not present

## 2019-11-10 DIAGNOSIS — D473 Essential (hemorrhagic) thrombocythemia: Secondary | ICD-10-CM | POA: Diagnosis not present

## 2019-11-10 NOTE — Telephone Encounter (Signed)
Pt was notified and verbally understood to pick up a prescription and he has one refill on the Eliquis and hes done. He was also informed to call the pharmacy and refill by name for his other medications.

## 2019-11-13 ENCOUNTER — Other Ambulatory Visit: Payer: Self-pay

## 2019-11-13 DIAGNOSIS — G8220 Paraplegia, unspecified: Secondary | ICD-10-CM | POA: Diagnosis not present

## 2019-11-13 DIAGNOSIS — I10 Essential (primary) hypertension: Secondary | ICD-10-CM | POA: Diagnosis not present

## 2019-11-13 DIAGNOSIS — D473 Essential (hemorrhagic) thrombocythemia: Secondary | ICD-10-CM | POA: Diagnosis not present

## 2019-11-13 DIAGNOSIS — G35 Multiple sclerosis: Secondary | ICD-10-CM | POA: Diagnosis not present

## 2019-11-13 DIAGNOSIS — N3281 Overactive bladder: Secondary | ICD-10-CM | POA: Diagnosis not present

## 2019-11-13 DIAGNOSIS — I248 Other forms of acute ischemic heart disease: Secondary | ICD-10-CM | POA: Diagnosis not present

## 2019-11-13 DIAGNOSIS — E785 Hyperlipidemia, unspecified: Secondary | ICD-10-CM | POA: Diagnosis not present

## 2019-11-13 DIAGNOSIS — E119 Type 2 diabetes mellitus without complications: Secondary | ICD-10-CM | POA: Diagnosis not present

## 2019-11-13 DIAGNOSIS — I2699 Other pulmonary embolism without acute cor pulmonale: Secondary | ICD-10-CM | POA: Diagnosis not present

## 2019-11-13 DIAGNOSIS — R7401 Elevation of levels of liver transaminase levels: Secondary | ICD-10-CM | POA: Diagnosis not present

## 2019-11-13 DIAGNOSIS — S24103S Unspecified injury at T7-T10 level of thoracic spinal cord, sequela: Secondary | ICD-10-CM | POA: Diagnosis not present

## 2019-11-14 ENCOUNTER — Ambulatory Visit (INDEPENDENT_AMBULATORY_CARE_PROVIDER_SITE_OTHER): Payer: Medicare Other | Admitting: Nurse Practitioner

## 2019-11-14 ENCOUNTER — Encounter: Payer: Self-pay | Admitting: Nurse Practitioner

## 2019-11-14 VITALS — BP 128/60 | HR 63 | Temp 96.5°F | Ht 67.0 in | Wt 154.0 lb

## 2019-11-14 DIAGNOSIS — N3281 Overactive bladder: Secondary | ICD-10-CM | POA: Diagnosis not present

## 2019-11-14 DIAGNOSIS — H6123 Impacted cerumen, bilateral: Secondary | ICD-10-CM

## 2019-11-14 MED ORDER — MIRABEGRON ER 25 MG PO TB24: 25.0000 mg | ORAL_TABLET | Freq: Every day | ORAL | 5 refills | Status: DC

## 2019-11-14 MED ORDER — CARBAMIDE PEROXIDE 6.5 % OT SOLN: 5.0000 [drp] | Freq: Every day | OTIC | 0 refills | Status: AC

## 2019-11-14 NOTE — Progress Notes (Signed)
Subjective:  Patient ID: Amauri Keefe, male    DOB: 1951-03-05  Age: 69 y.o. MRN: [id number]  CC: Follow-up (pt here for ear cleaning//pt did mentioned overactive bladder-seems like hes up every hour-increased his oxybutynin but didn't help)  HPI OAB: Chronic, worsening in last 33month Increased urinary frequency despite increase ditropan dose to 177mWorse at night Denies any change in water intake, no fever, no back pain, no constipation.  Bilateral ear fullness with decreased hearing, denies any ear or head injury, no drainage, no dizziness.  Reviewed past Medical, Social and Family history today.  Outpatient Medications Prior to Visit  Medication Sig Dispense Refill  . ACCU-CHEK AVIVA PLUS test strip TEST ONCE DAILY (IN THE  MORNING, FASTING) 100 strip 3  . Accu-Chek FastClix Lancets MISC     . apixaban (ELIQUIS) 5 MG TABS tablet Take 1 tablet (5 mg total) by mouth 2 (two) times daily. 60 tablet 1  . aspirin 81 MG tablet Take 81 mg by mouth daily.    . baclofen (LIORESAL) 20 MG tablet TAKE 1 TABLET BY MOUTH 3  TIMES DAILY 270 tablet 3  . benzonatate (TESSALON PERLES) 100 MG capsule Take 1 capsule (100 mg total) by mouth every 6 (six) hours as needed for cough. 30 capsule 0  . Blood Glucose Calibration (ACCU-CHEK AVIVA) SOLN     . cloNIDine (CATAPRES) 0.1 MG tablet Take 1 tablet (0.1 mg total) by mouth 2 (two) times daily. 180 tablet 3  . ferrous sulfate 325 (65 FE) MG tablet Take 1 tablet (325 mg total) by mouth daily with breakfast. 90 tablet 3  . finasteride (PROSCAR) 5 MG tablet Take 1 tablet (5 mg total) by mouth daily. 90 tablet 3  . Incontinence Supply Disposable (DEPEND ADJUSTABLE UNDERWEAR LG) MISC Use as needed. 30 each 12  . Lancets Misc. (ACCU-CHEK FASTCLIX LANCET) KIT 1 Units by Does not apply route daily before breakfast. 1 kit 11  . metFORMIN (GLUCOPHAGE) 850 MG tablet Take 1 tablet (850 mg total) by mouth 2 (two) times daily with a meal. 180 tablet 3  . Multiple  Vitamin (MULTIVITAMIN) tablet Take 1 tablet by mouth daily.    . Olmesartan-amLODIPine-HCTZ 40-10-12.5 MG TABS Take 1 tablet by mouth daily. 90 tablet 3  . omeprazole (PRILOSEC) 20 MG capsule Take 20 mg by mouth daily.    . pregabalin (LYRICA) 75 MG capsule Take 1 capsule (75 mg total) by mouth 2 (two) times daily. 180 capsule 3  . traZODone (DESYREL) 50 MG tablet Take 1 tablet (50 mg total) by mouth at bedtime. 90 tablet 3  . oxybutynin (DITROPAN-XL) 5 MG 24 hr tablet TAKE 1 TABLET BY MOUTH AT  BEDTIME 90 tablet 3   No facility-administered medications prior to visit.    ROS See HPI  Objective:  BP 128/60   Pulse 63   Temp (!) 96.5 F (35.8 C) (Tympanic)   Ht _0  (1.702 m)   Wt 154 lb (69.9 kg)   SpO2 90%   BMI 24.12 kg/m   BP Readings from Last 3 Encounters:  11/14/19 128/60  11/01/19 118/60  10/04/19 101/70    Wt Readings from Last 3 Encounters:  11/14/19 154 lb (69.9 kg)  11/01/19 154 lb (69.9 kg)  09/27/19 158 lb 11.7 oz (72 kg)    Physical Exam Vitals reviewed.  HENT:     Right Ear: External ear normal. There is impacted cerumen.     Left Ear: External ear normal. There is impacted  cerumen.  Pulmonary:     Effort: Pulmonary effort is normal.  Abdominal:     General: Bowel sounds are normal. There is no distension.     Tenderness: There is no abdominal tenderness.  Neurological:     Mental Status: He is alert.    Lab Results  Component Value Date   WBC 2.1 Repeated and verified X2. (L) 11/01/2019   HGB 11.7 (L) 11/01/2019   HCT 35.0 (L) 11/01/2019   PLT 247.0 11/01/2019   GLUCOSE 117 (H) 11/01/2019   CHOL 176 11/01/2019   TRIG 69.0 11/01/2019   HDL 49.50 11/01/2019   LDLCALC 113 (H) 11/01/2019   ALT 9 11/01/2019   AST 19 11/01/2019   NA 140 11/01/2019   K 4.1 11/01/2019   CL 103 11/01/2019   CREATININE 0.86 11/01/2019   BUN 15 11/01/2019   CO2 29 11/01/2019   TSH 1.46 03/24/2019   PSA 0.52 06/10/2018   INR 1.01 02/16/2011   HGBA1C 6.1  11/01/2019   MICROALBUR 4.7 (H) 07/27/2016   Procedure Note :     Procedure :  Ear irrigation  Indication:  Cerumen impaction  Risks, including pain, dizziness, eardrum perforation, bleeding, infection and others as well as benefits were explained to the patient in detail. Verbal consent was obtained and the patient agreed to proceed.  We used "The Elephant Ear Irrigation Device" filled with lukewarm water for irrigation. A large amount wax was recovered. Procedure has also required manual wax removal with an ear loop. not well Tolerated. Complications: None. Unable to remove all cerumen from ear canals. Advised to use debrox solution daily for 3-5days and schedule another appt. Also gave him the option to schedule appt with ENT, but he declined  Assessment & Plan:  This visit occurred during the SARS-CoV-2 public health emergency.  Safety protocols were in place, including screening questions prior to the visit, additional usage of staff PPE, and extensive cleaning of exam room while observing appropriate contact time as indicated for disinfecting solutions.   [place] was seen today for follow-up.  Diagnoses and all orders for this visit:  Overactive bladder -     Urinalysis w microscopic + reflex cultur -     mirabegron ER (MYRBETRIQ) 25 MG TB24 tablet; Take 1 tablet (25 mg total) by mouth daily.  Bilateral hearing loss due to cerumen impaction -     carbamide peroxide (DEBROX) 6.5 % OTIC solution; Place 5 drops into both ears at bedtime for 5 days.   I have discontinued Ladarius Juniel's oxybutynin. I am also having him start on mirabegron ER and carbamide peroxide. Additionally, I am having him maintain his multivitamin, aspirin, Accu-Chek Aviva, Depend Adjustable Underwear Lg, Accu-Chek FastClix Lancet, Accu-Chek FastClix Lancets, baclofen, finasteride, ferrous sulfate, traZODone, pregabalin, benzonatate, apixaban, omeprazole, Accu-Chek Aviva Plus, cloNIDine, metFORMIN, and  Olmesartan-amLODIPine-HCTZ.  Meds ordered this encounter  Medications  . mirabegron ER (MYRBETRIQ) 25 MG TB24 tablet    Sig: Take 1 tablet (25 mg total) by mouth daily.    Dispense:  30 tablet    Refill:  5    Discontinue ditropan    Order Specific Question:   Supervising Provider    Answer:   Ronnald Nian [id number]  . carbamide peroxide (DEBROX) 6.5 % OTIC solution    Sig: Place 5 drops into both ears at bedtime for 5 days.    Dispense:  15 mL    Refill:  0    Order Specific Question:   Supervising Provider  AnswerRonnald Nian [[id number]]    Problem List Items Addressed This Visit      Genitourinary   Overactive bladder - Primary    Previous use of vesicare, and flomax Current use of ditropan, dose increased to 84m with no improvement.  Changed ditropan to [employer]urine for today Consider urology referral if no improvement      Relevant Medications   mirabegron ER (MYRBETRIQ) 25 MG TB24 tablet   Other Relevant Orders   Urinalysis w microscopic + reflex cultur    Other Visit Diagnoses    Bilateral hearing loss due to cerumen impaction       Relevant Medications   carbamide peroxide (DEBROX) 6.5 % OTIC solution      Follow-up: No follow-ups on file.  CWilfred Lacy NP

## 2019-11-14 NOTE — Assessment & Plan Note (Signed)
Previous use of vesicare, and flomax Current use of ditropan, dose increased to 10mg  with no improvement.  Changed ditropan to [employer] urine for today Consider urology referral if no improvement

## 2019-11-14 NOTE — Patient Instructions (Addendum)
Go to lab for urine collection. Use debrox solution for 5nights, then return to office for irrigation  Stop ditropan Start myrbetriq. If no improvement, will refer to urology.

## 2019-11-15 ENCOUNTER — Telehealth: Payer: Self-pay | Admitting: Nurse Practitioner

## 2019-11-15 DIAGNOSIS — G35 Multiple sclerosis: Secondary | ICD-10-CM | POA: Diagnosis not present

## 2019-11-15 DIAGNOSIS — N3281 Overactive bladder: Secondary | ICD-10-CM | POA: Diagnosis not present

## 2019-11-15 DIAGNOSIS — I10 Essential (primary) hypertension: Secondary | ICD-10-CM | POA: Diagnosis not present

## 2019-11-15 DIAGNOSIS — I2699 Other pulmonary embolism without acute cor pulmonale: Secondary | ICD-10-CM | POA: Diagnosis not present

## 2019-11-15 DIAGNOSIS — S24103S Unspecified injury at T7-T10 level of thoracic spinal cord, sequela: Secondary | ICD-10-CM | POA: Diagnosis not present

## 2019-11-15 DIAGNOSIS — I248 Other forms of acute ischemic heart disease: Secondary | ICD-10-CM | POA: Diagnosis not present

## 2019-11-15 DIAGNOSIS — R7401 Elevation of levels of liver transaminase levels: Secondary | ICD-10-CM | POA: Diagnosis not present

## 2019-11-15 DIAGNOSIS — E119 Type 2 diabetes mellitus without complications: Secondary | ICD-10-CM | POA: Diagnosis not present

## 2019-11-15 DIAGNOSIS — E785 Hyperlipidemia, unspecified: Secondary | ICD-10-CM | POA: Diagnosis not present

## 2019-11-15 DIAGNOSIS — D473 Essential (hemorrhagic) thrombocythemia: Secondary | ICD-10-CM | POA: Diagnosis not present

## 2019-11-15 DIAGNOSIS — G8220 Paraplegia, unspecified: Secondary | ICD-10-CM | POA: Diagnosis not present

## 2019-11-15 NOTE — Telephone Encounter (Signed)
[place] is calling and wanted to see if a home health plan of care form was faxed over. CB is [phone]

## 2019-11-15 NOTE — Telephone Encounter (Signed)
Larry [name] was contacted and she verified that she received the home health plan of care form this morning from [place].

## 2019-11-17 ENCOUNTER — Encounter: Payer: Self-pay | Admitting: Nurse Practitioner

## 2019-11-17 DIAGNOSIS — D473 Essential (hemorrhagic) thrombocythemia: Secondary | ICD-10-CM | POA: Diagnosis not present

## 2019-11-17 DIAGNOSIS — G35 Multiple sclerosis: Secondary | ICD-10-CM | POA: Diagnosis not present

## 2019-11-17 DIAGNOSIS — I2699 Other pulmonary embolism without acute cor pulmonale: Secondary | ICD-10-CM | POA: Diagnosis not present

## 2019-11-17 DIAGNOSIS — G8220 Paraplegia, unspecified: Secondary | ICD-10-CM | POA: Diagnosis not present

## 2019-11-17 DIAGNOSIS — R7401 Elevation of levels of liver transaminase levels: Secondary | ICD-10-CM | POA: Diagnosis not present

## 2019-11-17 DIAGNOSIS — E119 Type 2 diabetes mellitus without complications: Secondary | ICD-10-CM | POA: Diagnosis not present

## 2019-11-17 DIAGNOSIS — N3281 Overactive bladder: Secondary | ICD-10-CM | POA: Diagnosis not present

## 2019-11-17 DIAGNOSIS — I10 Essential (primary) hypertension: Secondary | ICD-10-CM | POA: Diagnosis not present

## 2019-11-17 DIAGNOSIS — S24103S Unspecified injury at T7-T10 level of thoracic spinal cord, sequela: Secondary | ICD-10-CM | POA: Diagnosis not present

## 2019-11-17 DIAGNOSIS — I248 Other forms of acute ischemic heart disease: Secondary | ICD-10-CM | POA: Diagnosis not present

## 2019-11-17 DIAGNOSIS — E785 Hyperlipidemia, unspecified: Secondary | ICD-10-CM | POA: Diagnosis not present

## 2019-11-20 DIAGNOSIS — I10 Essential (primary) hypertension: Secondary | ICD-10-CM | POA: Diagnosis not present

## 2019-11-20 DIAGNOSIS — R7401 Elevation of levels of liver transaminase levels: Secondary | ICD-10-CM | POA: Diagnosis not present

## 2019-11-20 DIAGNOSIS — G35 Multiple sclerosis: Secondary | ICD-10-CM | POA: Diagnosis not present

## 2019-11-20 DIAGNOSIS — S24103S Unspecified injury at T7-T10 level of thoracic spinal cord, sequela: Secondary | ICD-10-CM | POA: Diagnosis not present

## 2019-11-20 DIAGNOSIS — D473 Essential (hemorrhagic) thrombocythemia: Secondary | ICD-10-CM | POA: Diagnosis not present

## 2019-11-20 DIAGNOSIS — E785 Hyperlipidemia, unspecified: Secondary | ICD-10-CM | POA: Diagnosis not present

## 2019-11-20 DIAGNOSIS — I2699 Other pulmonary embolism without acute cor pulmonale: Secondary | ICD-10-CM | POA: Diagnosis not present

## 2019-11-20 DIAGNOSIS — N3281 Overactive bladder: Secondary | ICD-10-CM | POA: Diagnosis not present

## 2019-11-20 DIAGNOSIS — G8220 Paraplegia, unspecified: Secondary | ICD-10-CM | POA: Diagnosis not present

## 2019-11-20 DIAGNOSIS — E119 Type 2 diabetes mellitus without complications: Secondary | ICD-10-CM | POA: Diagnosis not present

## 2019-11-20 DIAGNOSIS — I248 Other forms of acute ischemic heart disease: Secondary | ICD-10-CM | POA: Diagnosis not present

## 2019-11-22 ENCOUNTER — Telehealth: Payer: Self-pay | Admitting: Nurse Practitioner

## 2019-11-22 NOTE — Progress Notes (Signed)
  Chronic Care Management   Outreach Note  11/22/2019 Name: Larry [name] MRN: [id number] DOB: [birth year]  Referred by: Flossie Buffy, NP Reason for referral : No chief complaint on file.   An unsuccessful telephone outreach was attempted today. The patient was referred to the pharmacist for assistance with care management and care coordination.   Follow Up Plan:   [place]

## 2019-11-28 DIAGNOSIS — I10 Essential (primary) hypertension: Secondary | ICD-10-CM | POA: Diagnosis not present

## 2019-11-28 DIAGNOSIS — E785 Hyperlipidemia, unspecified: Secondary | ICD-10-CM | POA: Diagnosis not present

## 2019-11-28 DIAGNOSIS — E119 Type 2 diabetes mellitus without complications: Secondary | ICD-10-CM | POA: Diagnosis not present

## 2019-11-28 DIAGNOSIS — I2699 Other pulmonary embolism without acute cor pulmonale: Secondary | ICD-10-CM | POA: Diagnosis not present

## 2019-11-28 DIAGNOSIS — G8220 Paraplegia, unspecified: Secondary | ICD-10-CM | POA: Diagnosis not present

## 2019-11-28 DIAGNOSIS — R7401 Elevation of levels of liver transaminase levels: Secondary | ICD-10-CM | POA: Diagnosis not present

## 2019-11-28 DIAGNOSIS — I248 Other forms of acute ischemic heart disease: Secondary | ICD-10-CM | POA: Diagnosis not present

## 2019-11-28 DIAGNOSIS — S24103S Unspecified injury at T7-T10 level of thoracic spinal cord, sequela: Secondary | ICD-10-CM | POA: Diagnosis not present

## 2019-11-28 DIAGNOSIS — D473 Essential (hemorrhagic) thrombocythemia: Secondary | ICD-10-CM | POA: Diagnosis not present

## 2019-11-28 DIAGNOSIS — N3281 Overactive bladder: Secondary | ICD-10-CM | POA: Diagnosis not present

## 2019-11-28 DIAGNOSIS — G35 Multiple sclerosis: Secondary | ICD-10-CM | POA: Diagnosis not present

## 2019-12-06 ENCOUNTER — Ambulatory Visit: Payer: Medicare Other

## 2019-12-06 DIAGNOSIS — N3281 Overactive bladder: Secondary | ICD-10-CM

## 2019-12-06 DIAGNOSIS — E785 Hyperlipidemia, unspecified: Secondary | ICD-10-CM

## 2019-12-06 DIAGNOSIS — E1169 Type 2 diabetes mellitus with other specified complication: Secondary | ICD-10-CM

## 2019-12-06 NOTE — Patient Instructions (Addendum)
Visit Information  Goals Addressed            This Visit's Progress   . Chronic Care Management       CARE PLAN ENTRY (see longitudinal plan of care for additional care plan information)  Current Barriers:  . Chronic Disease Management support, education, and care coordination needs related to Hypertension, Hyperlipidemia, Diabetes, Overactive Bladder, and Peripheral Artery Disease   Hypertension BP Readings from Last 3 Encounters:  11/14/19 128/60  11/01/19 118/60  10/04/19 101/70   . Pharmacist Clinical Goal(s): o Over the next 90 days, patient will work with PharmD and providers to maintain BP goal <130/80 . Current regimen:  o Olmesartan-Amlodipine-HCTZ 40-10-12.5 mg daily  o Clonidine 0.1 mg BID . Patient self care activities - Over the next 90 days, patient will: o Check blood pressure weekly, document, and provide at future appointments o Ensure daily salt intake < 2300 mg/day  Hyperlipidemia Lab Results  Component Value Date/Time   LDLCALC 113 (H) 11/01/2019 11:45 AM   . Pharmacist Clinical Goal(s): o Over the next 90 days, patient will work with PharmD and providers to achieve LDL goal < 70 . Current regimen:  o None . Interventions: o Recommend starting rosuvastatin 10 mg daily  . Patient self care activities - Over the next 90 days, patient will: o Limit red meat, processed meat, and greasy or fatty foods. o Eat more vegetables and fruit  Diabetes Lab Results  Component Value Date/Time   HGBA1C 6.1 11/01/2019 11:43 AM   HGBA1C 5.9 03/24/2019 03:55 PM   . Pharmacist Clinical Goal(s): o Over the next 90 days, patient will work with PharmD and providers to achieve A1c goal <7% . Current regimen:  o Metformin 850 mg twice daily  o Pioglitazone 15 mg daily  . Patient self care activities - Over the next 90 days, patient will: o Check blood sugar once daily, document, and provide at future appointments o Contact provider with any episodes of  hypoglycemia  Overactive Bladder . Pharmacist Clinical Goal(s) o Over the next 90 days, patient will work with PharmD and providers to provide relief of urinary symptoms . Current regimen:  o Finasteride 5 mg daily             o Myrbetriq 25 mg daily . Interventions: o Recommend increasing Myrbetriq to 50 mg daily  Medication management . Pharmacist Clinical Goal(s): o Over the next 90 days, patient will work with PharmD and providers to maintain optimal medication adherence . Current pharmacy: [facility] . Interventions o Comprehensive medication review performed. o Continue current medication management strategy . Patient self care activities - Over the next 90 days, patient will: o Take medications as prescribed o Report any questions or concerns to PharmD and/or provider(s)       The patient verbalized understanding of instructions provided today and agreed to receive a mailed copy of patient instruction and/or educational materials.  Telephone follow up appointment with pharmacy team member scheduled for: 01/02/20 at 8:30 AM  [place] at [facility]  [phone]

## 2019-12-06 NOTE — Chronic Care Management (AMB) (Signed)
Chronic Care Management Pharmacy  Name: Larry [name]  MRN: [id number] DOB: 1950/08/13  Chief Complaint/ HPI  Larry [name],  69 y.o. , male presents for their Follow-Up CCM visit with the clinical pharmacist via telephone.  PCP : Flossie Buffy, NP  Their chronic conditions include: hypertension, type 2 diabetes, peripheral neuropathy, overactive bladder  Office Visits: 11/14/19: Patient presented to [facility] for follow-up. Oxybutynin ineffective, discontinued and Myrbetriq 25 mg daily started. Carbamide peroxide 6.5% ear drops started.  11/01/19: Patient presented to [facility] for follow-up. Eliquis refilled at 5 mg BID for another 30 days.    Consult Visits:  09/23/19-10/04/19: Patient hospitalized with acute respiratory failure and PE due to Covid-19. Patient discharged to SNF. Patient started on Eliquis 10 mg BID 05/09/19: Patient referred to Dr. Marcial Pacas (Neuro) for gait abnormality. profound bilateral leg weakness, dependent on wheelchair. MRI revealed moderate-severe spinal stenosis. Patient referred to Dr. Ellene Route for surgical decompression.   Today, patient is feeling much better. He states he no longer has many residual symptomsfrom the Covid infection and he is much closer to feeling like himself again.   Medications: Outpatient Encounter Medications as of 12/06/2019  Medication Sig  . ACCU-CHEK AVIVA PLUS test strip TEST ONCE DAILY (IN THE  MORNING, FASTING)  . Accu-Chek FastClix Lancets MISC   . apixaban (ELIQUIS) 5 MG TABS tablet Take 1 tablet (5 mg total) by mouth 2 (two) times daily.  Marland Kitchen aspirin 81 MG tablet Take 81 mg by mouth daily.  . baclofen (LIORESAL) 20 MG tablet TAKE 1 TABLET BY MOUTH 3  TIMES DAILY  . benzonatate (TESSALON PERLES) 100 MG capsule Take 1 capsule (100 mg total) by mouth every 6 (six) hours as needed for cough.  . Blood Glucose Calibration (ACCU-CHEK AVIVA) SOLN   . cloNIDine (CATAPRES) 0.1 MG tablet Take 1 tablet (0.1 mg total) by  mouth 2 (two) times daily.  . ferrous sulfate 325 (65 FE) MG tablet Take 1 tablet (325 mg total) by mouth daily with breakfast.  . finasteride (PROSCAR) 5 MG tablet Take 1 tablet (5 mg total) by mouth daily.  . Incontinence Supply Disposable (DEPEND ADJUSTABLE UNDERWEAR LG) MISC Use as needed.  . Lancets Misc. (ACCU-CHEK FASTCLIX LANCET) KIT 1 Units by Does not apply route daily before breakfast.  . metFORMIN (GLUCOPHAGE) 850 MG tablet Take 1 tablet (850 mg total) by mouth 2 (two) times daily with a meal.  . mirabegron ER (MYRBETRIQ) 25 MG TB24 tablet Take 1 tablet (25 mg total) by mouth daily.  . Multiple Vitamin (MULTIVITAMIN) tablet Take 1 tablet by mouth daily.  . Olmesartan-amLODIPine-HCTZ 40-10-12.5 MG TABS Take 1 tablet by mouth daily.  Marland Kitchen omeprazole (PRILOSEC) 20 MG capsule Take 20 mg by mouth daily.  . pregabalin (LYRICA) 75 MG capsule Take 1 capsule (75 mg total) by mouth 2 (two) times daily.  . traZODone (DESYREL) 50 MG tablet Take 1 tablet (50 mg total) by mouth at bedtime.   No facility-administered encounter medications on file as of 12/06/2019.   SDOH Interventions     Most Recent Value  SDOH Interventions  Food Insecurity Interventions Intervention Not Indicated  Financial Strain Interventions Intervention Not Indicated     Current Diagnosis/Assessment:  Goals Addressed            This Visit's Progress   . Chronic Care Management       CARE PLAN ENTRY (see longitudinal plan of care for additional care plan information)  Current Barriers:  .  Chronic Disease Management support, education, and care coordination needs related to Hypertension, Hyperlipidemia, Diabetes, Overactive Bladder, and Peripheral Artery Disease   Hypertension BP Readings from Last 3 Encounters:  11/14/19 128/60  11/01/19 118/60  10/04/19 101/70   . Pharmacist Clinical Goal(s): o Over the next 90 days, patient will work with PharmD and providers to maintain BP goal <130/80 . Current regimen:   o Olmesartan-Amlodipine-HCTZ 40-10-12.5 mg daily  o Clonidine 0.1 mg BID . Patient self care activities - Over the next 90 days, patient will: o Check blood pressure weekly, document, and provide at future appointments o Ensure daily salt intake < 2300 mg/day  Hyperlipidemia Lab Results  Component Value Date/Time   LDLCALC 113 (H) 11/01/2019 11:45 AM   . Pharmacist Clinical Goal(s): o Over the next 90 days, patient will work with PharmD and providers to achieve LDL goal < 70 . Current regimen:  o None . Interventions: o Recommend starting rosuvastatin 10 mg daily  . Patient self care activities - Over the next 90 days, patient will: o Limit red meat, processed meat, and greasy or fatty foods. o Eat more vegetables and fruit  Diabetes Lab Results  Component Value Date/Time   HGBA1C 6.1 11/01/2019 11:43 AM   HGBA1C 5.9 03/24/2019 03:55 PM   . Pharmacist Clinical Goal(s): o Over the next 90 days, patient will work with PharmD and providers to achieve A1c goal <7% . Current regimen:  o Metformin 850 mg twice daily  o Pioglitazone 15 mg daily  . Patient self care activities - Over the next 90 days, patient will: o Check blood sugar once daily, document, and provide at future appointments o Contact provider with any episodes of hypoglycemia  Overactive Bladder . Pharmacist Clinical Goal(s) o Over the next 90 days, patient will work with PharmD and providers to provide relief of urinary symptoms . Current regimen:  o Finasteride 5 mg daily             o Myrbetriq 25 mg daily . Interventions: o Recommend increasing Myrbetriq to 50 mg daily  Medication management . Pharmacist Clinical Goal(s): o Over the next 90 days, patient will work with PharmD and providers to maintain optimal medication adherence . Current pharmacy: [facility] . Interventions o Comprehensive medication review performed. o Continue current medication management strategy . Patient self  care activities - Over the next 90 days, patient will: o Take medications as prescribed o Report any questions or concerns to PharmD and/or provider(s)       Diabetes   Recent Relevant Labs: Lab Results  Component Value Date/Time   HGBA1C 6.1 11/01/2019 11:43 AM   HGBA1C 5.9 03/24/2019 03:55 PM   MICROALBUR 4.7 (H) 07/27/2016 12:57 PM     Checking BG: Twice Weekly  Recent FBG Readings: 103, 98, 112, 115, 113. Highest in past month 120 Recent pre-meal BG readings: n/a Recent 2hr PP BG readings:  n/a Recent HS BG readings: n/a  Patient has failed these meds in past: n/a  Patient is currently controlled on the following medications:   Metformin 850 mg twice daily   Pioglitazone 15 mg daily  Goal A1c <7% Last diabetic Foot exam:  Lab Results  Component Value Date/Time   HMDIABEYEEXA No Retinopathy 04/15/2018 12:00 AM    Last diabetic Eye exam: No results found for: HMDIABFOOTEX   We discussed: diet and exercise extensively. Patient watches the amount of starchy foods he eats, but does drink soda. He reports he drinks ~1/2  regular coke bottle daily. Denies any current peripheral edema, but has had instances of worsening edema in the past.  Plan  Continue current medications.    Hypertension   Office blood pressures are  BP Readings from Last 3 Encounters:  11/14/19 128/60  11/01/19 118/60  10/04/19 101/70   CMP Latest Ref Rng & Units 11/01/2019 09/30/2019 09/28/2019  Glucose 70 - 99 mg/dL 117(H) 144(H) 182(H)  BUN 6 - 23 mg/dL 15 32(H) 37(H)  Creatinine 0.40 - 1.50 mg/dL 0.86 0.88 1.04  Sodium 135 - 145 mEq/L 140 138 138  Potassium 3.5 - 5.1 mEq/L 4.1 4.6 4.7  Chloride 96 - 112 mEq/L 103 103 105  CO2 19 - 32 mEq/L _0 Calcium 8.4 - 10.5 mg/dL 9.6 8.9 8.6(L)  Total Protein 6.0 - 8.3 g/dL 6.4 5.4(L) 5.3(L)  Total Bilirubin 0.2 - 1.2 mg/dL 0.4 0.7 0.5  Alkaline Phos 39 - 117 U/L 66 61 71  AST 0 - 37 U/L 19 73(H) 52(H)  ALT 0 - 53 U/L 9 48(H) 27   Patient has  failed these meds in the past: n/a Patient is currently controlled on the following medications:   Olmesartan-Amlodipine-HCTZ 40-10-12.5 mg daily   Clonidine 0.1 mg BID   Patient checks BP at home 1-2x per week   Patient home BP readings are ranging: 125  We discussed diet and exercise extensively No feet swelling. Does not drink enough water (~16 oz daily). Drinks one large cup of coffee every few days.  Plan  Continue current medications    Hyperlipidemia   Lipid Panel     Component Value Date/Time   CHOL 176 11/01/2019 1145   TRIG 69.0 11/01/2019 1145   HDL 49.50 11/01/2019 1145   CHOLHDL 4 11/01/2019 1145   VLDL 13.8 11/01/2019 1145   LDLCALC 113 (H) 11/01/2019 1145     The ASCVD Risk score (Goff DC Jr., et al., 2013) failed to calculate for the following reasons:   Unable to determine if patient is Non-Hispanic African American   Patient has failed these meds in past: n/a Patient is currently uncontrolled on the following medications:   Aspirin 81 mg daily   We discussed: Counseled patient on the importance of statin therapy to minimize risk of heart attacks and strokes. Reviewed MOA, side effects, and answered all of patients questions regarding statin therapy. Patient was amenable to try a statin medicine  Patient would likely benefit from high intensity statin given ASCVD 10-year risk of 33%, but recommend starting at moderate intensity statin given patient's history of bilateral leg weakness.  Plan  Recommend starting rosuvastatin 10 mg daily  Recommend follow-up lipid panel and CK at next PCP follow-up.   Pain/ Peripheral Neuralgia   Patient has failed these meds in past: n/a Patient is currently managed on the following medications:   Baclofen 20 mg TID   Meloxicam 15 mg daily PRN (taking daily)   Lyrica 75 mg BID  We discussed: Patient taking meloxicam daily at bedtime. Reinforced only taking meloxicam as needed for pain due to risk of adverse events  with chronic NSAID use (GI bleed, AKI).   Plan  Continue current medications   Overactive bladder   PSA  Date Value Ref Range Status  06/10/2018 0.52 0.10 - 4.00 ng/ml Final    Comment:    Test performed using Access Hybritech PSA Assay, a parmagnetic partical, chemiluminecent immunoassay.   Patient has failed these meds in past: oxybutynin (ineffective) Patient is currently  uncontrolled on the following medications:   Finasteride 5 mg daily    Myrbetriq 25 mg daily  We discussed: patient tolerating Myrbetriq well. Patient states he felt that initially it was very beneficial for his nocturia, but recently has felt it has not been as effective. He felt his nocturia has worsened again recently, although he does note that since starting Myrbetriq he has felt less urgency when he needs to urinate. He does not drink much water throughout the evening, estimate he drinks only ~16 oz daily.  Plan  Recommend increasing Myrbetriq to 50 mg daily.  GERD   Patient has failed these meds in past: n/a Patient is currently controlled on the following medications:   Omeprazole 20 mg daily   We discussed: Patient has not had GERD symptoms in a long time and asked if omeprazole was still necessary to take. We discussed the potential risks associated with prolonged PPI use.   Plan  Recommend stopping omeprazole and GERD symptoms have been well controlled over the past few months.    Misc/OTC   Multivitamin daily  Potassium chloride 20 mEq daily  Trazodone 50 mg QHS   Plan  Continue current medications  Vaccines   Reviewed and discussed patient's vaccination history.    Immunization History  Administered Date(s) Administered  . Fluad Quad(high Dose 65+) 03/24/2019  . Influenza Split 02/05/2012  . Influenza, High Dose Seasonal PF 03/05/2014, 05/17/2015, 02/15/2017, 06/10/2018  . Influenza,inj,Quad PF,6+ Mos 05/09/2013  . PPD Test 01/05/2016  . Pneumococcal Conjugate-13 03/05/2014    . Pneumococcal Polysaccharide-23 05/17/2015   Recommend patient receive Covid-19 vaccination 90 days after after infection (~01/04/20)  Medication Management   Pt uses [facility] for all medications. He does not use a pillbox for his medications.   Follow up: 3 months   Doristine Section [phone]

## 2019-12-10 ENCOUNTER — Telehealth: Payer: Self-pay | Admitting: Nurse Practitioner

## 2019-12-10 DIAGNOSIS — D72819 Decreased white blood cell count, unspecified: Secondary | ICD-10-CM

## 2019-12-10 DIAGNOSIS — E785 Hyperlipidemia, unspecified: Secondary | ICD-10-CM

## 2019-12-10 DIAGNOSIS — D649 Anemia, unspecified: Secondary | ICD-10-CM

## 2019-12-10 DIAGNOSIS — N3281 Overactive bladder: Secondary | ICD-10-CM

## 2019-12-10 DIAGNOSIS — E1169 Type 2 diabetes mellitus with other specified complication: Secondary | ICD-10-CM

## 2019-12-10 MED ORDER — ROSUVASTATIN CALCIUM 5 MG PO TABS: 5.0000 mg | ORAL_TABLET | Freq: Every day | ORAL | 3 refills | Status: DC

## 2019-12-10 NOTE — Telephone Encounter (Signed)
-----   Message from Germaine Pomfret, [facility] sent at 12/06/2019  9:30 AM EDT ----- Larry [name], Given patient's history of diabetes, what would you think about having the patient start on rosuvastatin 10 mg daily? Please consider also adding Hyperlipidemia associated with type 2 diabetes mellitus to the patient's problem list. Patient also states Myrbetriq has helped somewhat with his overactive bladder, but he has noticed it has worsened again recently. What would think about increasing his Myrbetriq to 50 mg daily?Please let me know your thoughts and I will communicate them with the patient! Thanks, Anastasio Auerbach Pharmacist[place] at [employer] [phone]

## 2019-12-10 NOTE — Telephone Encounter (Signed)
[facility] sent for elevated LDL Advise to return to lab to provide urine sample and blood draw. This is needed in order to make any adjustments to mybertriq dose

## 2019-12-11 DIAGNOSIS — H40013 Open angle with borderline findings, low risk, bilateral: Secondary | ICD-10-CM | POA: Diagnosis not present

## 2019-12-11 DIAGNOSIS — E119 Type 2 diabetes mellitus without complications: Secondary | ICD-10-CM | POA: Diagnosis not present

## 2019-12-11 LAB — HM DIABETES EYE EXAM

## 2019-12-12 NOTE — Telephone Encounter (Signed)
Patient returned call. Instructed patient that rosuvastatin 5 mg daily was sent into [facility] and to begin taking the medication daily. Also informed patient to schedule visit with lab to check urinalysis, PSA, and CBC. Patient states his urinary symptoms have been slightly better lately, but that it is still variable. He verbalized his understanding and agreement of the above plan.   Will follow-up with patient in one month to assess rosuvastatin tolerability.   [place] at [facility]  [phone]

## 2019-12-12 NOTE — Telephone Encounter (Signed)
Unable to reach patient LVM

## 2019-12-20 DIAGNOSIS — R350 Frequency of micturition: Secondary | ICD-10-CM

## 2019-12-20 DIAGNOSIS — N401 Enlarged prostate with lower urinary tract symptoms: Secondary | ICD-10-CM

## 2019-12-22 NOTE — Telephone Encounter (Signed)
Patient would like the Myrbetriq prescription sent to [facility] please.

## 2019-12-25 ENCOUNTER — Other Ambulatory Visit (INDEPENDENT_AMBULATORY_CARE_PROVIDER_SITE_OTHER): Payer: Medicare Other

## 2019-12-25 ENCOUNTER — Other Ambulatory Visit: Payer: Self-pay

## 2019-12-25 ENCOUNTER — Other Ambulatory Visit: Payer: Self-pay | Admitting: Nurse Practitioner

## 2019-12-25 DIAGNOSIS — N3281 Overactive bladder: Secondary | ICD-10-CM

## 2019-12-25 DIAGNOSIS — D72819 Decreased white blood cell count, unspecified: Secondary | ICD-10-CM | POA: Diagnosis not present

## 2019-12-25 DIAGNOSIS — G629 Polyneuropathy, unspecified: Secondary | ICD-10-CM

## 2019-12-25 LAB — CBC WITH DIFFERENTIAL/PLATELET
Basophils Absolute: 0 10*3/uL (ref 0.0–0.1)
Basophils Relative: 1.3 % (ref 0.0–3.0)
Eosinophils Absolute: 0.1 10*3/uL (ref 0.0–0.7)
Eosinophils Relative: 3.8 % (ref 0.0–5.0)
HCT: 36.9 % — ABNORMAL LOW (ref 39.0–52.0)
Hemoglobin: 12.2 g/dL — ABNORMAL LOW (ref 13.0–17.0)
Lymphocytes Relative: 27.5 % (ref 12.0–46.0)
Lymphs Abs: 0.9 10*3/uL (ref 0.7–4.0)
MCHC: 33.2 g/dL (ref 30.0–36.0)
MCV: 93.3 fl (ref 78.0–100.0)
Monocytes Absolute: 0.2 10*3/uL (ref 0.1–1.0)
Monocytes Relative: 6.2 % (ref 3.0–12.0)
Neutro Abs: 2 10*3/uL (ref 1.4–7.7)
Neutrophils Relative %: 61.2 % (ref 43.0–77.0)
Platelets: 329 10*3/uL (ref 150.0–400.0)
RBC: 3.95 Mil/uL — ABNORMAL LOW (ref 4.22–5.81)
RDW: 14.4 % (ref 11.5–15.5)
WBC: 3.3 10*3/uL — ABNORMAL LOW (ref 4.0–10.5)

## 2019-12-25 LAB — PSA: PSA: 0.71 ng/mL (ref 0.10–4.00)

## 2019-12-25 NOTE — Patient Outreach (Signed)
Aging Gracefully Program  12/25/2019  Larry [name] [birth year] [id number]  [facility] Evaluation Interviewer attempted to call patient on today regarding Aging Gracefully referral. No answer from patient after multiple rings.  Will attempt to call back within 1 week.  [place] Management Assistant [phone]

## 2019-12-26 ENCOUNTER — Other Ambulatory Visit: Payer: Self-pay

## 2019-12-26 NOTE — Patient Outreach (Signed)
Aging Gracefully Program  12/26/2019  Larry [name] 04-27-1951 [id number]  [facility] Evaluation Interviewer made contact with patient. Aging Gracefully survey scheduled for Thursday, July 29 at 12.  [place] Management Assistant [phone]

## 2019-12-27 ENCOUNTER — Telehealth: Payer: Medicare Other

## 2019-12-28 ENCOUNTER — Ambulatory Visit: Payer: Self-pay

## 2019-12-28 ENCOUNTER — Other Ambulatory Visit: Payer: Self-pay

## 2019-12-28 NOTE — Patient Outreach (Signed)
Aging Gracefully Program  12/28/2019  Larry [name] [birth year] [id number]  [facility] Evaluation Interviewer made contact with patient. Aging Gracefully survey completed.   Interviewer will send referral to Reginia Naas, RN and OT for follow up.   [place] Management Assistant [phone]

## 2020-01-01 ENCOUNTER — Telehealth: Payer: Self-pay | Admitting: Nurse Practitioner

## 2020-01-01 NOTE — Telephone Encounter (Signed)
Patient is calling to see how soon he can take the COVID vaccine since he was released from the hospital on June 21st after being admitted in April for [place]. Please call him back at [phone].

## 2020-01-02 NOTE — Telephone Encounter (Signed)
Please see message and advise.  Thank you. ° °

## 2020-01-02 NOTE — Telephone Encounter (Signed)
Ok to proceed with COVID vaccine

## 2020-01-03 ENCOUNTER — Telehealth: Payer: Medicare Other

## 2020-01-04 ENCOUNTER — Telehealth: Payer: Self-pay

## 2020-01-04 ENCOUNTER — Other Ambulatory Visit: Payer: Self-pay | Admitting: Nurse Practitioner

## 2020-01-04 DIAGNOSIS — G629 Polyneuropathy, unspecified: Secondary | ICD-10-CM

## 2020-01-04 MED ORDER — PREGABALIN 75 MG PO CAPS: 75.0000 mg | ORAL_CAPSULE | Freq: Two times a day (BID) | ORAL | 3 refills | Status: DC

## 2020-01-04 NOTE — Telephone Encounter (Signed)
Patient called back regarding this message and I gave him [place]'s Ok to get the vaccine.

## 2020-01-04 NOTE — Telephone Encounter (Signed)
Spoke to pt. He is wanting the rx to be sent to [facility]. Last refills were sent to [facility].

## 2020-01-04 NOTE — Progress Notes (Signed)
Left Voice message to confirmed patient appointment on 01/05/2020  at 11:30 AM with Junius Argyle the Clinical pharmacist.   [place] Pharmacist Assistant [phone]

## 2020-01-04 NOTE — Telephone Encounter (Signed)
Patient states his pharmacy sent a refill request for pregabalin and it was denied by the provider. Patient is calling to find out why.

## 2020-01-04 NOTE — Telephone Encounter (Signed)
pregabalin (LYRICA) 75 MG capsule [[id number]]   Order Details Dose: 75 mg Route: Oral Frequency: 2 times daily  Dispense Quantity: 180 capsule Refills: 3       Sig: Take 1 capsule (75 mg total) by mouth 2 (two) times daily.      Start Date: 09/14/19 End Date: --  Written Date: 09/14/19 Expiration Date: 09/13/20    Diagnosis Association: Peripheral polyneuropathy (G62.9)  Original Order:  pregabalin (LYRICA) 75 MG capsule [[id number]]    Order Questions  Question Answer Comment  Supervising Provider Letta Median K       Providers  Authorizing Provider: Flossie Buffy, NP NPI: [id number]  [place] #: [id number]  Supervising Provider: Ronnald Nian, DO NPI: [id number]  DEA #: [id number]  Ordering User:  Flossie Buffy, [place] ([place]), [place]

## 2020-01-05 ENCOUNTER — Ambulatory Visit: Payer: Medicare Other

## 2020-01-05 DIAGNOSIS — E785 Hyperlipidemia, unspecified: Secondary | ICD-10-CM

## 2020-01-05 DIAGNOSIS — E1169 Type 2 diabetes mellitus with other specified complication: Secondary | ICD-10-CM

## 2020-01-05 DIAGNOSIS — I152 Hypertension secondary to endocrine disorders: Secondary | ICD-10-CM

## 2020-01-05 NOTE — Chronic Care Management (AMB) (Signed)
Chronic Care Management Pharmacy  Name: Larry [name]  MRN: [id number] DOB: 1950/12/20  Chief Complaint/ HPI  Larry [name],  69 y.o. , male presents for their Follow-Up CCM visit with the clinical pharmacist via telephone.  PCP : Flossie Buffy, NP  Their chronic conditions include: hypertension, type 2 diabetes, peripheral neuropathy, overactive bladder  Office Visits: 11/14/19: Patient presented to [facility] for follow-up. Oxybutynin ineffective, discontinued and Myrbetriq 25 mg daily started. Carbamide peroxide 6.5% ear drops started.  11/01/19: Patient presented to [facility] for follow-up. Eliquis refilled at 5 mg BID for another 30 days.    Consult Visits:  09/23/19-10/04/19: Patient hospitalized with acute respiratory failure and PE due to Covid-19. Patient discharged to SNF. Patient started on Eliquis 10 mg BID 05/09/19: Patient referred to Dr. Marcial Pacas (Neuro) for gait abnormality. profound bilateral leg weakness, dependent on wheelchair. MRI revealed moderate-severe spinal stenosis. Patient referred to Dr. Ellene Route for surgical decompression.   Today, patient is feeling much better. He states he no longer has many residual symptomsfrom the Covid infection and he is much closer to feeling like himself again.   Medications: Outpatient Encounter Medications as of 01/05/2020  Medication Sig  . aspirin 81 MG tablet Take 81 mg by mouth daily.  . baclofen (LIORESAL) 20 MG tablet TAKE 1 TABLET BY MOUTH 3  TIMES DAILY  . cloNIDine (CATAPRES) 0.1 MG tablet Take 1 tablet (0.1 mg total) by mouth 2 (two) times daily.  . ferrous sulfate 325 (65 FE) MG tablet Take 1 tablet (325 mg total) by mouth daily with breakfast.  . finasteride (PROSCAR) 5 MG tablet Take 1 tablet (5 mg total) by mouth daily.  . metFORMIN (GLUCOPHAGE) 850 MG tablet Take 1 tablet (850 mg total) by mouth 2 (two) times daily with a meal.  . mirabegron ER (MYRBETRIQ) 25 MG TB24 tablet Take 1 tablet (25 mg total)  by mouth daily.  . Olmesartan-amLODIPine-HCTZ 40-10-12.5 MG TABS Take 1 tablet by mouth daily.  . pregabalin (LYRICA) 75 MG capsule Take 1 capsule (75 mg total) by mouth 2 (two) times daily.  . traZODone (DESYREL) 50 MG tablet Take 1 tablet (50 mg total) by mouth at bedtime.  Marland Kitchen ACCU-CHEK AVIVA PLUS test strip TEST ONCE DAILY (IN THE  MORNING, FASTING)  . Accu-Chek FastClix Lancets MISC   . apixaban (ELIQUIS) 5 MG TABS tablet Take 1 tablet (5 mg total) by mouth 2 (two) times daily.  . benzonatate (TESSALON PERLES) 100 MG capsule Take 1 capsule (100 mg total) by mouth every 6 (six) hours as needed for cough.  . Blood Glucose Calibration (ACCU-CHEK AVIVA) SOLN   . Incontinence Supply Disposable (DEPEND ADJUSTABLE UNDERWEAR LG) MISC Use as needed.  . Lancets Misc. (ACCU-CHEK FASTCLIX LANCET) KIT 1 Units by Does not apply route daily before breakfast.  . Multiple Vitamin (MULTIVITAMIN) tablet Take 1 tablet by mouth daily.  Marland Kitchen omeprazole (PRILOSEC) 20 MG capsule Take 20 mg by mouth daily. (Patient not taking: Reported on 01/05/2020)  . rosuvastatin (CRESTOR) 5 MG tablet Take 1 tablet (5 mg total) by mouth daily. (Patient not taking: Reported on 01/05/2020)   No facility-administered encounter medications on file as of 01/05/2020.   SDOH Interventions     Most Recent Value  SDOH Interventions  Food Insecurity Interventions Intervention Not Indicated  Financial Strain Interventions Intervention Not Indicated     Current Diagnosis/Assessment:  Goals Addressed            This Visit's Progress   .  Chronic Care Management       CARE PLAN ENTRY (see longitudinal plan of care for additional care plan information)  Current Barriers:  . Chronic Disease Management support, education, and care coordination needs related to Hypertension, Hyperlipidemia, Diabetes, Overactive Bladder, and Peripheral Artery Disease   Hypertension BP Readings from Last 3 Encounters:  11/14/19 128/60  11/01/19 118/60    10/04/19 101/70   . Pharmacist Clinical Goal(s): o Over the next 90 days, patient will work with PharmD and providers to maintain BP goal <130/80 . Current regimen:  o Olmesartan-Amlodipine-HCTZ 40-10-12.5 mg daily  o Clonidine 0.1 mg BID . Interventions:  o Recommend tapering clonidine  o Recommend increasing Olmesartan-Amlodipine-HCTZ . Patient self care activities - Over the next 30 days, patient will: o Check blood pressure daily, document, and provide at future appointments o Ensure daily salt intake < 2300 mg/day  Hyperlipidemia Lab Results  Component Value Date/Time   LDLCALC 113 (H) 11/01/2019 11:45 AM   . Pharmacist Clinical Goal(s): o Over the next 90 days, patient will work with PharmD and providers to achieve LDL goal < 70 . Current regimen:  o None . Interventions: o Recommend resuming rosuvastatin 5 mg daily to lower your chances of heart attack/stroke . Patient self care activities - Over the next 90 days, patient will: o Limit red meat, processed meat, and greasy or fatty foods. o Eat more vegetables and fruit  Diabetes Lab Results  Component Value Date/Time   HGBA1C 6.1 11/01/2019 11:43 AM   HGBA1C 5.9 03/24/2019 03:55 PM   . Pharmacist Clinical Goal(s): o Over the next 90 days, patient will work with PharmD and providers to achieve A1c goal <7% . Current regimen:  o Metformin 850 mg twice daily  o Pioglitazone 15 mg daily  . Patient self care activities - Over the next 90 days, patient will: o Check blood sugar once daily, document, and provide at future appointments o Contact provider with any episodes of hypoglycemia  Overactive Bladder . Pharmacist Clinical Goal(s) o Over the next 90 days, patient will work with PharmD and providers to provide relief of urinary symptoms . Current regimen:  o Finasteride 5 mg daily             o Myrbetriq 25 mg daily . Interventions: o Recommend increasing Myrbetriq to 50 mg daily at next appointment with  [facility].  Medication management . Pharmacist Clinical Goal(s): o Over the next 90 days, patient will work with PharmD and providers to maintain optimal medication adherence . Current pharmacy: [facility] . Interventions o Comprehensive medication review performed. o Continue current medication management strategy . Patient self care activities - Over the next 90 days, patient will: o Take medications as prescribed o Report any questions or concerns to PharmD and/or provider(s)       Diabetes   Recent Relevant Labs: Lab Results  Component Value Date/Time   HGBA1C 6.1 11/01/2019 11:43 AM   HGBA1C 5.9 03/24/2019 03:55 PM   MICROALBUR 4.7 (H) 07/27/2016 12:57 PM     Checking BG: Once every 2 weeks  Recent FBG Readings: 107  Recent pre-meal BG readings: n/a Recent 2hr PP BG readings:  n/a Recent HS BG readings: n/a  Patient has failed these meds in past: n/a  Patient is currently controlled on the following medications:   Metformin 850 mg twice daily    Goal A1c <7% Last diabetic Foot exam:  Lab Results  Component Value Date/Time   HMDIABEYEEXA No Retinopathy  04/15/2018 12:00 AM    Last diabetic Eye exam: No results found for: HMDIABFOOTEX   We discussed: diet and exercise extensively. Patient watches the amount of starchy foods he eats, but does drink soda. He reports he drinks ~1/2 regular coke bottle daily.   Plan  Continue current medications.    Hypertension   Goal < 130/80   Office blood pressures are  BP Readings from Last 3 Encounters:  11/14/19 128/60  11/01/19 118/60  10/04/19 101/70   CMP Latest Ref Rng & Units 11/01/2019 09/30/2019 09/28/2019  Glucose 70 - 99 mg/dL 117(H) 144(H) 182(H)  BUN 6 - 23 mg/dL 15 32(H) 37(H)  Creatinine 0.40 - 1.50 mg/dL 0.86 0.88 1.04  Sodium 135 - 145 mEq/L 140 138 138  Potassium 3.5 - 5.1 mEq/L 4.1 4.6 4.7  Chloride 96 - 112 mEq/L 103 103 105  CO2 19 - 32 mEq/L '29 25 22  ' Calcium 8.4 - 10.5 mg/dL  9.6 8.9 8.6(L)  Total Protein 6.0 - 8.3 g/dL 6.4 5.4(L) 5.3(L)  Total Bilirubin 0.2 - 1.2 mg/dL 0.4 0.7 0.5  Alkaline Phos 39 - 117 U/L 66 61 71  AST 0 - 37 U/L 19 73(H) 52(H)  ALT 0 - 53 U/L 9 48(H) 27   Patient has failed these meds in the past: n/a Patient is currently controlled on the following medications:   Olmesartan-Amlodipine-HCTZ 40-10-12.5 mg daily   Clonidine 0.1 mg BID   Patient checks BP at home infrequently   Patient home BP readings are ranging: n/a  We discussed diet and exercise extensively. No feet swelling. Does not drink enough water (~16 oz daily). Drinks one large cup of coffee every few days.   Plan  Recommend tapering clonidine due to patient feeling overwhelmed with medications.  Recommend increasing olmesartan-amlodipine-HCTZ to 40-10-25 mg daily  Recommend patient resumes checking blood pressure daily for 1 week and HTN to assess regimen change.  Continue current medications    Hyperlipidemia   LDL Goal <70  Lipid Panel     Component Value Date/Time   CHOL 176 11/01/2019 1145   TRIG 69.0 11/01/2019 1145   HDL 49.50 11/01/2019 1145   CHOLHDL 4 11/01/2019 1145   VLDL 13.8 11/01/2019 1145   LDLCALC 113 (H) 11/01/2019 1145    ASCVD 33% (01/05/20)  The ASCVD Risk score Mikey Bussing DC Jr., et al., 2013) failed to calculate for the following reasons:   Unable to determine if patient is Non-Hispanic African American   Patient has failed these meds in past: n/a Patient is currently uncontrolled on the following medications:   Aspirin 81 mg daily   Rosuvastatin 5 mg daily (not taking)  We discussed: Patient picked up rosuvastatin, but never started it due to concerns of taking too many medications. Counseled patient on high risk of ASCVD and the importance of statin therapy to reduce risk of heart attacks and strokes. Patient stated he may be willing to restart rosuvastatin if we decrease his pill burden.   Plan  Recommend resuming rosuvastatin 5 mg  daily  CPA follow-up in one week to assess adherence to rosuvastatin.  Recommend follow-up lipid panel and CK at next PCP follow-up.   Pain/ Peripheral Neuralgia   Patient has failed these meds in past: n/a Patient is currently managed on the following medications:   Baclofen 20 mg TID   Meloxicam 15 mg daily PRN (taking daily)   Lyrica 75 mg BID  We discussed: Patient taking meloxicam daily at bedtime. Reinforced only taking  meloxicam as needed for pain due to risk of adverse events with chronic NSAID use (GI bleed, AKI).   Patient states he has noticed he has been feeling a little more stiff at night.   Plan  Continue current medications   Overactive bladder   PSA  Date Value Ref Range Status  12/25/2019 0.71 0.10 - 4.00 ng/mL Final    Comment:    Test performed using Access Hybritech PSA Assay, a parmagnetic partical, chemiluminecent immunoassay.   Patient has failed these meds in past: oxybutynin (ineffective) Patient is currently uncontrolled on the following medications:   Finasteride 5 mg daily    Myrbetriq 25 mg daily  We discussed: patient tolerating Myrbetriq well. Patient states he felt that initially it was very beneficial for his nocturia, but recently has felt it has not been as effective. He felt his nocturia has worsened again recently, although he does note that since starting Myrbetriq he has felt less urgency when he needs to urinate. He does not drink much water throughout the evening, estimate he drinks only ~16 oz daily.  Plan  Recommend increasing Myrbetriq to 50 mg daily.  GERD   Patient has failed these meds in past: omeprazole (no longer necessary)  Patient is currently controlled on the following medications:   None  We discussed: Patient has not had GERD symptoms in a long time, and stopped omeprazole. He has not had a return of his reflux symptoms.   Plan  Continue control with diet.   Misc/OTC   Multivitamin daily  Potassium chloride  20 mEq daily  Trazodone 50 mg QHS   Could consider discontinuing trazodone if sleep well controlled.   Plan  Continue current medications  Vaccines   Reviewed and discussed patient's vaccination history.    Immunization History  Administered Date(s) Administered  . Fluad Quad(high Dose 65+) 03/24/2019  . Influenza Split 02/05/2012  . Influenza, High Dose Seasonal PF 03/05/2014, 05/17/2015, 02/15/2017, 06/10/2018  . Influenza,inj,Quad PF,6+ Mos 05/09/2013  . PPD Test 01/05/2016  . Pneumococcal Conjugate-13 03/05/2014  . Pneumococcal Polysaccharide-23 05/17/2015   Recommend patient receive Covid-19 vaccination 90 days after after infection (~01/04/20)  Medication Management   Pt uses [facility] for all medications. He does not use a pillbox for his medications.   Follow up: 1 week CPA visit.   Doristine Section [phone]

## 2020-01-08 ENCOUNTER — Encounter: Payer: Self-pay | Admitting: Nurse Practitioner

## 2020-01-08 NOTE — Progress Notes (Signed)
Abstracted result and sent to scan  

## 2020-01-09 ENCOUNTER — Other Ambulatory Visit: Payer: Self-pay

## 2020-01-09 ENCOUNTER — Other Ambulatory Visit: Payer: Self-pay | Admitting: Occupational Therapy

## 2020-01-09 NOTE — Patient Instructions (Signed)
Goals Addressed            This Visit's Progress   . Patient Stated       Ease and safety with showering (including two grab bars in shower (one on long wall partially beside tub bench spanning up towards front wall of shower and one at front wall of shower; as well as widening hallway door to bathroom.    . Patient Stated       Safety with accessing stove knobs at back of stove top.    . Patient Stated       Safety and independence with accessing items on higher shelves in kitchen.    . Patient Stated       Ease of dryer use (needs new knobs)

## 2020-01-09 NOTE — Patient Outreach (Signed)
Aging Gracefully Program  OT Initial Visit  01/09/2020  Larry [name] 1950-10-29 [id number]  Visit:  1- Initial Visit  Start Time:  1200 End Time:  1300 Total Minutes:  71  CCAP: Typical Daily Routine: Typical Daily Routine:: Gets up, gets a shower, stands at sink to groom , fixes and eats breakfast, watches tv and does tasks around the house What Terminous?: Uses [employer], a friend, or his daughter (lives in [place]) to take him places, daughter takes care of deep cleaning when she comes into town (about 2 times a month), uses [employer] for groceries Do You Think You Need Other Types Of Help?: no What Do You Think Would Make Everyday Life Easier For You?: grab bars in shower, having bathroom door wider so wheelchair can get through it easier, having mailbox at front or side door Do You Have Time For Yourself?: yes Patient Reported Equipment: Patient Reported Equipment Currently Used: Reacher, Forensic psychologist, Wheelchair, Other (comment) (leg lifter) Other Equipment:: rolling walker and rollator Functional Mobility-Walking Indoors/Getting Around the [employer]:  Walking Indoors/Getting Around [employer]: A little difficulty Do you: Use A Device Observation: wheelchair through door in hall bathroom door Functional Mobility-Walk A Block: Walk A Block: No Difficulty (uses wheelchair) Functional Mobility-Maintain Balance While Showering: Maintaining Balance While Showering: A Lot Of Difficulty Do You:: Use A Device Importance Of Learning New Strategies:: Very Much Observation: Maintain Balance While Showering:  (holds on to water controls for balance) Safety: Moderate/Extreme Risk Efficiency: Somewhat Intervention: Yes Other Comments:: would benefit from grab bars in shower Functional Mobility-Stooping, Crouching, Kneeling To Retreive Item: Stooping, Crouching, or Kneeling To Retrieve Item: No Difficulty Functional Mobility-Bending From Standing Position To Pick Up Clothing  Off The Floor: Bending Over From Standing Position To Pick Up Clothing Off The Floor: No Difficulty (from wheelchair) Functional Mobility-Reaching For Items Above Shoulder Level: Reaching For Items Above Shoulder Level: A Lot Of Difficulty (In standing) Do You:: Use A Device (Holds onto counter top with 1 hand and reaches up with other) Importance Of Learning New Strategies:: Very Much Observation: Reaching For Items Above Shoulder Level: Independent With Pain, Difficulty, Or Use Of Device Safety: Moderate/Extreme Risk Efficiency: Somewhat Intervention:  (maybe--need to look into a way to lower shelf or get cabinet) Functional Mobility-Climb 1 Flight Of Stairs: Climb 1 Flight Of Stairs: N/A (in wheelchair) Functional Mobility-Move In And Out Of Chair: Move In and Out Of A Chair: No Difficulty Functional Mobility-Move In And Out Of Bed: Move In and Out Of Bed: No Difficulty Functional Mobility-Move In And Out Of Bath/Shower: Move In And Out Of A Bath/Shower: No Difficulty Functional Mobility-Get On And Off Toilet: Getting Up From The Floor: Moderate Difficulty (if there is nothing around to get up with (ie: furniture)) [place], Not Including Driving: [place], Not Including Driving: No Difficulty     Activities of Daily Living-Bathing/Showering: ADL-Bathing/Showering: Moderate Difficulty Do You:: Use A Device (tub bench) Importance Of Learning New Strategies: Very Much ADL Observation: Bathing/Showering: Independent With Pain, Difficulty, Or Use Of Device Safety: Moderate/Extreme Risk Efficiency: Somewhat Intervention: Yes Other Comments:: grab bars Activities of Daily Living-Personal Hygiene and Grooming: Personal Hygiene and Grooming: No Difficulty Activities of Daily Living-Toilet Hygiene: Toilet Hygiene: No Difficulty Activities of Daily Living-Put On And Take Off Undergarments (Incl. Fasteners): Put On And Take Off Undergarments  (Incl. Fasteners): No Difficulty Activities of Daily Living-Put On And Take Off Shirt/Dress/Coat (Incl. Fasteners): Put  On And Take Off Shirt/Dress/Coat (Incl. Fasteners): No Difficulty Activities of Daily Living-Put On And Take Off Socks And Shoes: Put On And Take Off Socks And  Shoes:  (No difficulty presently but may have back surgery soon) Activities of Daily Living-Feed Self: Feed Self: No Difficulty Activities of Daily Living-Rest And Sleep: Rest and Sleep: Moderate Difficulty Other Comments:: only sleeps 2-3 hours at a time Activities of Daily Living-Sexual Activity: Sexual  Activity: No Difficulty   Instrumental Activities of Daily Living-Light Homemaking (Laundry, Straightening Up, Vacuuming):  Do Light Homemaking (Laundry, Straightening Up, Vacuuming): A Little Difficulty Other Comments:: daughter assists with with deep cleaning. He does his own laundry (currently knobs are broken on his dryer so he has to use pliers to turn them) Instrumental Activities of Daily Living-Making A Bed: Making a Bed: No Difficulty Instrumental Activities of Daily Living-Washing Dishes By Hand While Standing At The Sink: Washing Dishes By Hand While Standing At The Sink: A Little Difficulty Other Comments:: SIts some to wash dishes and stands for others (props on sink) Instrumental Activities of Daily Living-Grocery Shopping: Do Grocery Shopping:  Visual merchandiser for delivery) Instrumental Activities of Daily Living-Use Telephone: Use Telephone: No Difficulty Instrumental Activities of Daily Living-Financial Management: Financial Management: No Difficulty Instrumental Activities of Daily Living-Medications: Take Medications: No Difficulty Instrumental Activities of Daily Living-Health Management And Maintenance: Health Management & Maintenance: A Little Difficulty Do You:: No Device/No Assistance Importance Of Learning New Strategies: Moderate Other Comments:: Only sleeps 2-3 hours at a time,  not very much appetite Instrumental Activities of Daily Living-Meal Preparation and Clean-Up: Meal Preparation and Clean-Up: A Little Difficulty Do You:: Use A Device (sometimes a reacher) Importance Of Learning New Strategies: Moderate Other Comments:: some cabinets are really to high for him even with standing due to weakness in legs, stove top controls are on back of stove (burn hazzard) Instrumental Activities of Daily Living-Provide Care For Others/Pets: Care For Others/Pets: N/A Instrumental Activities of Daily Living-Take Part In Organized Social Activities: Other Comments:: Was going to [facility] with a buddy of his prior to Oct 2020 (was on [place] crutches (since Oct 2020 he has been in  wheelchair and has not been going to gym)  Readiness To Change Score:  Readiness to Change Score: 10  Home Environment Assessment: Outside Home Entry:: Currently one slightly ramped entry into/out of house Kitchen:: Some cabinets are too high for him since he is in a wheelchair, can stand but hard to reach over head with one hand while standing--has to hold on to counter top with other hand) Bathroom:: Step in shower (approximately 3 inch height), tight squeeze in wheelchair to get through hall bathroom door (can't get in through door to bathroom from master bedroom (too small for wheelchair) Laundry:: Has to stand to get to controls for washing machine, due this wheelchair won't fit between washing machine and dryer. Washer and dryer are just the right distance apart for him to be able to take them out of the washer and put them in dryer with one motion from his wheelchair Mailbox:: Mailbox is at street--would be better for to be at his front or side door for him to have easier access to mail (especially in inclement weather) since he mobilzes in a wheelchair   Goals: Goals Addressed            This Visit's Progress   . Patient Stated       Ease and safety with showering (including two grab bars  in  shower (one on long wall partially beside tub bench spanning up towards front wall of shower and one at front wall of shower; as well as widening hallway door to bathroom.    . Patient Stated       Safety with accessing stove knobs at back of stove top.    . Patient Stated       Safety and independence with accessing items on higher shelves in kitchen.    . Patient Stated       Ease of dryer use (needs new knobs)       Post Clinical Reasoning: Clinician View Of Client Situation:: Larry [name] lives in a one story home with one entrance to his home ramped. He gets around in his house at a wheelchair level with some standing (while he at least holds on with one hand or props on one arm). He is independent to modified independent from a wheelchair level, There are a few tasks that are more difficult for him than others but he has managed to problem solve pretty much every task he needs to do for himself. He has a tub bench for his step in shower but no grab bars (thus he has to hold onto his shower knobs to steady himself when he does stand up in his shower), He is able to get into his bathroom in his wheelchair from the hallway, but it is a tight squeeze. His controls to his stove top are at the back of the stove (as most are), but this is a safety concern from a wheelchair level. His laundry room is set up perfectly for him to be able to take clothes from washer and put into dryer, but he does have to stand and step/reach to turn dryer on (not enough space to get between washer and dryer) and in addition he is currently problem solved by using a pair of pliers to turn knobs on dryer due to knobs have broken off. Client View Of His/Her Situation:: Larry [name] feels he does well from himself living on his own (and I would totally agree). He has found ways to make his home layout work for him from his wheelchair. He is a very independent person and wants to remain that way. There are a few things  that he feels would make his home better for him (grab bars in shower, wider door into bathroom, knobs for his dryer, a way to reach controls for his stove top from wheelchair level, and ways to get to his cooking items that are on higher shelves that he cannot reach easily even in standing even with a reacher) Next Visit Plan:: safety with accessing stove knobs from wheelchair level

## 2020-01-15 ENCOUNTER — Other Ambulatory Visit: Payer: Self-pay | Admitting: *Deleted

## 2020-01-15 ENCOUNTER — Telehealth: Payer: Self-pay

## 2020-01-15 ENCOUNTER — Other Ambulatory Visit: Payer: Self-pay | Admitting: Nurse Practitioner

## 2020-01-15 NOTE — Progress Notes (Addendum)
Chronic Care Management Pharmacy Assistant   Name: Larry [name]  MRN: [id number] DOB: 05-Apr-1951  Reason for [place] Call.   PCP : Flossie Buffy, NP  Allergies:   Allergies  Allergen Reactions   No Known Allergies     Medications: Outpatient Encounter Medications as of 01/15/2020  Medication Sig   ACCU-CHEK AVIVA PLUS test strip TEST ONCE DAILY (IN THE  MORNING, FASTING)   Accu-Chek FastClix Lancets MISC    apixaban (ELIQUIS) 5 MG TABS tablet Take 1 tablet (5 mg total) by mouth 2 (two) times daily.   aspirin 81 MG tablet Take 81 mg by mouth daily.   baclofen (LIORESAL) 20 MG tablet TAKE 1 TABLET BY MOUTH 3  TIMES DAILY   benzonatate (TESSALON PERLES) 100 MG capsule Take 1 capsule (100 mg total) by mouth every 6 (six) hours as needed for cough.   Blood Glucose Calibration (ACCU-CHEK AVIVA) SOLN    cloNIDine (CATAPRES) 0.1 MG tablet Take 1 tablet (0.1 mg total) by mouth 2 (two) times daily.   ferrous sulfate 325 (65 FE) MG tablet Take 1 tablet (325 mg total) by mouth daily with breakfast.   finasteride (PROSCAR) 5 MG tablet Take 1 tablet (5 mg total) by mouth daily.   Incontinence Supply Disposable (DEPEND ADJUSTABLE UNDERWEAR LG) MISC Use as needed.   Lancets Misc. (ACCU-CHEK FASTCLIX LANCET) KIT 1 Units by Does not apply route daily before breakfast.   metFORMIN (GLUCOPHAGE) 850 MG tablet Take 1 tablet (850 mg total) by mouth 2 (two) times daily with a meal.   mirabegron ER (MYRBETRIQ) 25 MG TB24 tablet Take 1 tablet (25 mg total) by mouth daily.   Multiple Vitamin (MULTIVITAMIN) tablet Take 1 tablet by mouth daily.   Olmesartan-amLODIPine-HCTZ 40-10-12.5 MG TABS Take 1 tablet by mouth daily.   omeprazole (PRILOSEC) 20 MG capsule Take 20 mg by mouth daily. (Patient not taking: Reported on 01/05/2020)   pregabalin (LYRICA) 75 MG capsule Take 1 capsule (75 mg total) by mouth 2 (two) times daily.   rosuvastatin  (CRESTOR) 5 MG tablet Take 1 tablet (5 mg total) by mouth daily. (Patient not taking: Reported on 01/05/2020)   traZODone (DESYREL) 50 MG tablet Take 1 tablet (50 mg total) by mouth at bedtime.   No facility-administered encounter medications on file as of 01/15/2020.    Current Diagnosis: Patient Active Problem List   Diagnosis Date Noted   Elevated LDL cholesterol level 11/09/2019   Multiple subsegmental pulmonary emboli without acute cor pulmonale ([place]) 11/01/2019   Pressure injury of skin 09/25/2019   Acute respiratory failure due to COVID-19 [facility]) 09/23/2019   Benign prostatic hyperplasia with lower urinary tract symptoms 09/14/2019   Cervical stenosis of spinal canal 05/09/2019   Thoracic spinal stenosis 05/09/2019   Gait abnormality 08/08/2018   Spinal stenosis 08/08/2018   Iron deficiency anemia 02/09/2018   Bowel incontinence 01/01/2017   Vitamin D deficiency 01/11/2016   GERD (gastroesophageal reflux disease) 01/11/2016   Spinal stenosis, lumbar region, with neurogenic claudication 01/10/2016   Spinal stenosis of lumbar region with radiculopathy 01/10/2016   Acute blood loss anemia 01/01/2016   Elective surgery    Post-operative pain    Tachycardia    Peripheral neuropathy    Spondylolisthesis of lumbar region 12/30/2015   CTS (carpal tunnel syndrome) 09/26/2015   Lumbar stenosis 09/26/2015   Weakness 09/05/2015   Spinal stenosis in cervical region 06/12/2015   Leukocytopenia 05/22/2013   History of spinal cord injury 12/12/2011  Paraplegia ([place]) 12/11/2011   Altered mental status 12/11/2011   DM (diabetes mellitus) ([place]) 12/11/2011   Overactive bladder 12/11/2011   MS (multiple sclerosis) ([place])    Hypertension     Goals Addressed   None     Follow-Up:  Pharmacist Review   Reviewed chart prior to disease state call. Spoke with patient regarding BP,  Recent Office Vitals: BP Readings from Last 3 Encounters:  11/14/19  128/60  11/01/19 118/60  10/04/19 101/70   Pulse Readings from Last 3 Encounters:  11/14/19 63  11/01/19 94  10/04/19 60    Wt Readings from Last 3 Encounters:  11/14/19 154 lb (69.9 kg)  11/01/19 154 lb (69.9 kg)  09/27/19 158 lb 11.7 oz (72 kg)     Kidney Function Lab Results  Component Value Date/Time   CREATININE 0.86 11/01/2019 11:43 AM   CREATININE 0.88 09/30/2019 02:24 AM   CREATININE 1.0 01/04/2014 09:43 AM   CREATININE 0.9 05/22/2013 11:15 AM   GFR 87.61 11/01/2019 11:43 AM   GFRNONAA >60 09/30/2019 02:24 AM   GFRNONAA >89 12/25/2011 11:32 AM   GFRAA >60 09/30/2019 02:24 AM   GFRAA >89 12/25/2011 11:32 AM    BMP Latest Ref Rng & Units 11/01/2019 09/30/2019 09/28/2019  Glucose 70 - 99 mg/dL 117(H) 144(H) 182(H)  BUN 6 - 23 mg/dL 15 32(H) 37(H)  Creatinine 0.40 - 1.50 mg/dL 0.86 0.88 1.04  Sodium 135 - 145 mEq/L 140 138 138  Potassium 3.5 - 5.1 mEq/L 4.1 4.6 4.7  Chloride 96 - 112 mEq/L 103 103 105  CO2 19 - 32 mEq/L '29 25 22  ' Calcium 8.4 - 10.5 mg/dL 9.6 8.9 8.6(L)     Current antihypertensive regimen:  o Clonidine 0.35m BID o olmesartan-amlodipine-HCTZ 40-10-12.5 mg tablet Daily  How often are you checking your Blood Pressure? 3-5x per week  Current home BP readings: o 01/10/2020 at 1:00 pm 133/51 o 01/11/2020 at 1:30 pm 137/51 o 01/13/2020 at 5:00 pm 132/73 o 01/15/2020 at 12:30 pm 136/62    What recent interventions/DTPs have been made by any provider to improve Blood Pressure control since last CPP Visit: no  Any recent hospitalizations or ED visits since last visit with CPP? No  What diet changes have been made to improve Blood Pressure Control?  o Patient stated he does Home cook food that includes no salt, no vegetables, chicken,fish ,pork chops, fruits, soda ,coffee, one bottle of water daily.   What exercise is being done to improve your Blood Pressure Control?  o Patient stated he does exercise with his ankle weights in his  wheelchair  Comprehensive medication review performed; Spoke to patient regarding cholesterol  Lipid Panel    Component Value Date/Time   CHOL 176 11/01/2019 1145   TRIG 69.0 11/01/2019 1145   HDL 49.50 11/01/2019 1145   LDLCALC 113 (H) 11/01/2019 1145    10-year ASCVD risk score: The ASCVD Risk score (Mikey BussingDC Jr., et al., 2013) failed to calculate for the following reasons:   Unable to determine if patient is Non-Hispanic African American   Current antihyperlipidemic regimen:  o Rosuvastatin 5 mg Daily  Previous antihyperlipidemic medications tried: None ID  ASCVD risk enhancing conditions: age >>19 DM, HTN, CKD, CHF, current smoker o DM,HTN,Current Smoker  What recent interventions/DTPs have been made by any provider to improve Cholesterol control since last CPP Visit:  o Restarted Rosuvastatin 5 mg Daily on 01/05/2020  Any recent hospitalizations or ED visits since last visit with CPP? No  What diet changes have been made to improve Cholesterol?  o Patient stated he does Home cook food that includes no salt, no vegetables, chicken,fish ,pork chops, fruits, soda ,coffee, one bottle of water daily   What exercise is being done to improve Cholesterol? o Patient stated he does exercise with his ankle weights in his wheelchair  Patient stated he notice on 01/12/2020 both of his feet was swollen.Patient stated "the only thing that is different, I restarted taking rosuvastatin 5 mg since I saw Clinical Pharmacist on 01/05/2020".Patient stated he has not skipped a dose and been taking rosuvastatin 49m daily at bedtime.Patient reported "I thought about it and I decided to stop taking the rosuvastatin (i advised him to wait until he spoke to the clinical pharmacist AJunius Argyle because I am already taking 19 pills and I feel my cholesterol is not that bad".  Adherence Review: Is the patient currently on ACE/ARB medication? Yes Does the patient have >5 day gap between last estimated  fill dates? Yes   BLindenPharmacist Assistant [phone]

## 2020-01-15 NOTE — Patient Outreach (Signed)
Aging Gracefully Program  01/15/2020  Larry [name] 1950/10/05 [id number]  Placed call to client [name] to schedule Aging Gracefully RN initial visit  HIPAA compliant voice mail message left for client, requesting return call back.  Plan:   Will re-attempt call to scheduled RN initial home visit later this week if I do not hear back from patient first  Oneta Rack, RN, BSN, [employer] Coordinator [facility] Care Management  [phone]

## 2020-01-16 ENCOUNTER — Other Ambulatory Visit: Payer: Self-pay | Admitting: *Deleted

## 2020-01-16 NOTE — Patient Outreach (Signed)
Aging Gracefully Program  01/16/2020  Larry [name] 1950/06/03 [id number]  Placed call to Julieanne Manson to schedule initial Aging Gracefully RN home visit; HIPAA/ identity and address verified.     Visit scheduled with client for Thursday January 18, 2020 between 12:30-1:00 pm; explained that I would contact him by phone on day of scheduled visit prior to arriving at his home, to complete coronavirus questionnaire screening tool; confirmed that patient has a mask available at his home for use during scheduled visit.   Provided my direct contact information to patient, should (she) wish to contact me prior to scheduled home visit.   Plan:   Scheduled RN initial home visit on Thursday January 18, 2020 between 12:30- 1:00 pm  Oneta Rack, RN, BSN, [place] Care Management  [phone]

## 2020-01-18 ENCOUNTER — Other Ambulatory Visit: Payer: Self-pay | Admitting: *Deleted

## 2020-01-18 ENCOUNTER — Encounter: Payer: Self-pay | Admitting: *Deleted

## 2020-01-18 ENCOUNTER — Other Ambulatory Visit: Payer: Self-pay

## 2020-01-18 NOTE — Patient Outreach (Signed)
Aging Gracefully Program  RN Visit # 1  01/18/2020  Larry [name] 09-20-1950 [id number]  Visit:   RN Visit # 1  Start Time:   12:25 pm End Time:   13:50 pm Total Minutes:   85  Readiness To Change Score:  Readiness to Change Score: 9.33  Universal RN Interventions: Calendar Distribution: Yes (Reviewed with client today) Exercise Review: Yes (Reviewed with client today- modified according to patient ability) Medications: Yes (Reviewed with client today) Medication Changes: No Mood: Yes (Reviewed with client today) Pain: Yes (Reviewed with client today) PCP Advocacy/Support: Yes (Reviewed with client today) Fall Prevention: Yes (Reviewed with client today) Incontinence: Yes (Reviewed with client today)  Healthcare Provider Communication: Did Higher education careers adviser With [place] Provider?: No  Clinician View of Client Situation: Clinician View Of Client Situation: Larry [name] is amazingly independent in his self- care despite being wheelchair bound and living alone; his home is neat, clean, and uncluttered.  Several home modifications have been identified which should help Larry [name] remains independent and self-sufficient in his care.  He has good support from family and friends who are limited in their ability to assist.  He does not drive and ocassionally uses his transportation benefit through his insurance plan, although this is not hius preference due to poor experiences in the past using this benefit.  He is interested in options avilable to him to obtain his second covid-vaccine at his home and these resources were provided today.  He would like to work together to learn more about fall prevention as this is his bigest concern as he ages.   Client's View of His/Her Situation: Client View Of His/Her Situation: "I am doing good overall; I am able to live independently, despite being tied to this wheelchair.  I am hoping that the home modifications will make things like getting in/ out of the  bathroon easier for me.  So far, I have not had many falls, but I know that I am at risk, being in this wheelchair and managing my care by myself.  I want to stay as independent as I can for as long as I can."  Medication Assessment: Do You Have Any Problems Paying For Medications?: No Where Does Client Store Medications?: Other: (dedicated bag with handle and zipper) Can Client Read Pill Bottles?: Yes Does Client Use A Pillbox?: Yes Does Anyone Assist Client In Filling Pillbox?: No Does Anyone Assist Client In Taking Medications?: No Do You Take Vitamin D?: Yes Total Number Of Medications That The Client Takes: 11 Does Client Have Any Questions Or Concerns About Medictions?: No Is Client Complaining Of Any Symptoms That Could Be Side Effects To Medications?: No Any Possible Changes In Medication Regimen?: No  OT Update: Will update Aging Gracefully team on successful completion of Aging Gracefully RN Visit # 1 today  Session Summary: Pleasant initial Aging Gracefully RN visit # 1; Larry [name] is independent ain his ADL's and iADL's and wishes to remain fall-free, self-sufficient and independent despite being wheelchair bound.  Many of his recommended home modifications around hygiene and cooking tasks would be beneficial to his overall safety at home.  He reports that his mailbox has already been moved to his front entryway, which is already helping him greatly.  He is very motivated and engaged in participating fully in the Aging Gracefully program.  I appreciate the opportunity to participate in the Aging Gracefully program with Larry [name],  Oneta Rack, RN, BSN, [place] Care Management  [phone])  [phone]      

## 2020-01-19 ENCOUNTER — Other Ambulatory Visit: Payer: Self-pay | Admitting: *Deleted

## 2020-01-19 NOTE — Patient Instructions (Addendum)
Larry [name], it was so nice to meet you!  I enjoyed our visit!  Here are the resources for the home-bound corona virus vaccine programs that I was able to find:  -- [place] Nurse Department; call Jalene Mullet at [phone] to schedule  -- At-Home Vaccination hotline: 866.3030.0026  -- [place]: [phone]  -- [place]: [phone]  I have scheduled our next visit together as a phone call for Tuesday February 20, 2020 between 1:30- 2:30 pm.  Please be listening out for my call.  I look forward to hearing about your progress.  If you think of anything new that we have not already talked about to help prevent you from falling, write it down so you can share with me when we talk again.  Take care and keep up the great work!   CLIENT/RN ACTION PLAN - FALL PREVENTION  Registered Nurse:  Larry Naas RN Date: January 18, 2020  Client Name: Larry [name] Client ID: [id number]   Target Area:  FALL PREVENTION    Why Problem May Occur: Mobility issues; wheelchair use; medications   Target Goal: No Falls   STRATEGIES Coping Strategies Ideas  Change Positions Slowly: lying to sitting, sitting to standing . Changing positions can make people lightheaded. . When getting up in the morning sit for a few minutes, before getting in your wheelchair.  . Stand for a few minutes before walking and hold on to sturdy furniture or countertop.  Timmothy Sours' [place] for anything: take your time . For tasks you don't do every day, visualize yourself completing the task before you begin it; think about how you will accomplish the task and problems you may encounter  Drink water- stay hydrated . Dehydration can make people dizzy. . Ask your healthcare provider how much water you can drink. . Decrease caffeine, caffeine makes you urinate a lot, which can make you dehydrated. .   If you fall, tell someone . Tell a friend or family member even if you didn't get hurt. . Tell  your healthcare provider if you fall.  They can help you figure out why. . Write down any falls you experience on your calendar along with the circumstances around the fall,so you can keep track of what happened to make you fall . Use your life alert system and keep your cell phone nearby you at all times  Get your vision and hearing checked . Poor vision and hearing loss can make people fall. . Glaucoma and diabetes can cause poor vision. Doristine Devoid job getting your recent eye exam- make it a part of your annual routine to have an eye exam  Other .    Prevention Ideas  Review your Medicines . Many medicines can make you dizzy or sleepy and increase your risk of falling. . Your Aging Gracefully Nurse will look at your medications and see if you are taking any that might cause falling. . Your blood pressure medications and the medicine to help you sleep could cause you to feel light headed; if you start feeling this way, tell your doctor  Activity and Exercise . Aging Gracefully exercises- do the ones that you are able . Walking (inside or outside) . Dancing . Gardening . Housework:  cooking, Education administrator, Medical sales representative . Exercise while watching TV . Swimming or water aerobics. . Continue doing your personal exercise routine with your weights at home . Once gyms are open again, consider calling your insurance program to find our about the wellness benefit;  maybe you and a friend could start going together  Strengthen Bones . Exercise makes your bones stronger. . Vitamin D and Calcium make your bones stronger.  Ask your Healthcare provider if you should be taking them. . Your body makes vitamin D from the sun.  Sit in the sun for 10 minutes every day (without sunscreen). .   Control your urine  . Prevent constipation . Cut back on caffeinated drinks. . Don't wait to urinate. . If diabetic, control your blood sugar. . Ask your Aging Gracefully Nurse and Healthcare Provider  about urine control. . Try  the Kegel exercises we talked about during our visit; these exercises strengthen the muscles that start and stop your urine flow  Control your blood sugar (if you are diabetic) . High blood sugar can cause frequent urination, poor vision, and numbness in your feet, which can make you fall.   . Low blood sugar can cause confusion and dizziness. . Sit down and take your blood sugar if you experience any of these symptoms  Other coping strategies 1.Always wear good sturdy shoes with good soles  2.If your feet/ legs continue to have swelling, let your doctor know   3. Talk to your doctor about options to receive physical therapy in your home-- you may want to look in to private duty physical therapy.  Your doctor may also have a physical therapist located at the office  4. Talk to your doctor about your sleep interruptions    PRACTICE It is important to practice the strategies so we can determine if they will be effective in helping to reach the goal.    Follow these specific recommendations:        If strategy does not work the first time, try it again.     We may make some changes over the next few sessions.      Oneta Rack, RN, BSN, [employer] [facility] Care Management  [phone]

## 2020-01-19 NOTE — Patient Outreach (Signed)
Aging Gracefully Program Follow up call to RN Visit # 1  01/19/2020  Larry [name] 1951-03-06 [id number]  Placed call to client to follow up on yesterday's RN visit # 1- call to provide resources for client to obtain corona virus vaccine through home bound vaccine programs/ resources available to him.  HIPAA compliant voice mail message left for client, making him aware that I have placed the details around resources in mail to him this morning via AVS; encouraged patient to contact me should he have questions or need clarification.  Oneta Rack, RN, BSN, [employer] [facility] Care Management  [phone]

## 2020-01-22 ENCOUNTER — Telehealth: Payer: Self-pay | Admitting: Nurse Practitioner

## 2020-01-22 NOTE — Telephone Encounter (Signed)
Entered order for urinalysis

## 2020-01-22 NOTE — Telephone Encounter (Signed)
Pt called stating that his feet has been swelling for 1 week now and more urinate than normal, he is not sure if it is coming from Rosuvastatin that he start about 1 week ago. [name] please advise

## 2020-01-22 NOTE — Telephone Encounter (Signed)
Pt stated he going to send someone to pick up th urine cup.   Larry [name] please check, do not see urine order in the system? Please help

## 2020-01-22 NOTE — Telephone Encounter (Signed)
Patient notified and verbalized understanding. 

## 2020-01-22 NOTE — Telephone Encounter (Signed)
Hold medication If no improvement in 48hrs, schedule Face to face appt

## 2020-01-23 ENCOUNTER — Other Ambulatory Visit: Payer: Self-pay

## 2020-01-23 ENCOUNTER — Other Ambulatory Visit: Payer: Self-pay | Admitting: Occupational Therapy

## 2020-01-23 NOTE — Patient Outreach (Signed)
Aging Gracefully Program  OT Follow-Up Visit  01/23/2020  Larry [name] 1950-12-08 [id number]  Visit:  2- Second Visit  Start Time:  6948 End Time:  5462 Total Minutes:  60        Readiness to Change Score :  Readiness to Change Score: 10   Durable Medical Equipment: Adaptive Equipment: Other (extendable handle to turn stove knobs from wheelchair level) Adaptive Equipment Distribution Date: 01/23/20  Patient Education:    Goals:  Goals Addressed            This Visit's Progress   . Patient Stated       Safety with accessing stove knobs at back of stove top.  ACTION PLANNING - CUSTOM  Target Problem Area:  Safety with accessing knobs on stove top seated in a wheelchair  Why Problem May Occur:  Because [name] gets around in a wheelchair and it is more difficult for him to stand, this puts him at risk for burning himself when he reaches to turn stove eyes on and off from a wheelchair level.    Target Goal: Increase safety from a seated position for turning on/off stove top controls   STRATEGIES   Modifying your home environment and making it safe: DO: DON'T:  Use the extended knob turner  Reach across a hot eye to turn off/on a burner eye   Stand to turn them off and on--may fall            Practice It is important to practice the strategies so we can determine if they will be effective in helping to reach your goal. Follow these specific recommendations: 1.Consistently use extended knob turner to get use to use it for your stove 2. 3.  If a strategy does not work the first time, try it again and again (and maybe again). We may make some changes over the next few sessions, based on how they work.   Golden Circle, OTR/L Aging Gracefully [phone]         Post Clinical Reasoning: Client Action (Goal) Two Interventions: Pt provided with an extended handle to able to more safely turn off and on the knobs for his stove from his  wheelchair Did Client Try?: Yes Targeted Problem Area Status: A Lot Better (need to also work on access to [employer] from wheelchair level) Public affairs consultant Of Client Situation:: The extended knob turner works relatively more safe than [name] having to reach all the way back to his controls for his stove top. [name] is concernd about his feet swelling since he started taking a new medication---he has since stopped the medication at the recommendation of his MD and plans to follow up with them soon (his feet are still swollen), He showed me how he had been propping up his feet and we talked about him propping them up even more. Client View Of His/Her Situation:: [name] is happy to give the knob turner a try and it may even work for his dryer too once he gets his new knobs. Next Visit Plan:: Cabinet to place in his kitchen to allow him to have safer access to his kitchen items that are up on his second and third selves of his kitchen cabinets

## 2020-01-23 NOTE — Patient Instructions (Signed)
Goals Addressed            This Visit's Progress   . Patient Stated       Safety with accessing stove knobs at back of stove top.  ACTION PLANNING - CUSTOM  Target Problem Area:  Safety with accessing knobs on stove top seated in a wheelchair  Why Problem May Occur:  Because Mr. Correll gets around in a wheelchair and it is more difficult for him to stand, this puts him at risk for burning himself when he reaches to turn stove eyes on and off from a wheelchair level.    Target Goal: Increase safety from a seated position for turning on/off stove top controls   STRATEGIES   Modifying your home environment and making it safe: DO: DON'T:  Use the extended knob turner  Reach across a hot eye to turn off/on a burner eye   Stand to turn them off and on--may fall            Practice It is important to practice the strategies so we can determine if they will be effective in helping to reach your goal. Follow these specific recommendations: 1.Consistently use extended knob turner to get use to use it for your stove 2. 3.  If a strategy does not work the first time, try it again and again (and maybe again). We may make some changes over the next few sessions, based on how they work.   Golden Circle, OTR/L Aging Gracefully [phone]

## 2020-01-24 ENCOUNTER — Other Ambulatory Visit: Payer: Self-pay | Admitting: Nurse Practitioner

## 2020-01-31 ENCOUNTER — Other Ambulatory Visit: Payer: Self-pay | Admitting: Nurse Practitioner

## 2020-01-31 ENCOUNTER — Other Ambulatory Visit: Payer: Self-pay

## 2020-02-01 ENCOUNTER — Encounter: Payer: Self-pay | Admitting: Nurse Practitioner

## 2020-02-01 ENCOUNTER — Ambulatory Visit (INDEPENDENT_AMBULATORY_CARE_PROVIDER_SITE_OTHER): Payer: Medicare Other | Admitting: Nurse Practitioner

## 2020-02-01 VITALS — BP 122/60 | HR 80 | Temp 97.0°F | Ht 67.0 in | Wt 155.0 lb

## 2020-02-01 DIAGNOSIS — G8220 Paraplegia, unspecified: Secondary | ICD-10-CM

## 2020-02-01 DIAGNOSIS — I10 Essential (primary) hypertension: Secondary | ICD-10-CM

## 2020-02-01 DIAGNOSIS — N3281 Overactive bladder: Secondary | ICD-10-CM

## 2020-02-01 DIAGNOSIS — M48062 Spinal stenosis, lumbar region with neurogenic claudication: Secondary | ICD-10-CM

## 2020-02-01 DIAGNOSIS — R351 Nocturia: Secondary | ICD-10-CM | POA: Diagnosis not present

## 2020-02-01 DIAGNOSIS — R269 Unspecified abnormalities of gait and mobility: Secondary | ICD-10-CM

## 2020-02-01 DIAGNOSIS — R600 Localized edema: Secondary | ICD-10-CM

## 2020-02-01 MED ORDER — FUROSEMIDE 20 MG PO TABS: 20.0000 mg | ORAL_TABLET | Freq: Every day | ORAL | 0 refills | Status: DC

## 2020-02-01 MED ORDER — POTASSIUM CHLORIDE CRYS ER 20 MEQ PO TBCR: 20.0000 meq | EXTENDED_RELEASE_TABLET | Freq: Every day | ORAL | 0 refills | Status: DC

## 2020-02-01 NOTE — Telephone Encounter (Signed)
Medication refill request for Actos.   Forwarding request to PCP due to being patient having same day appt.

## 2020-02-01 NOTE — Progress Notes (Signed)
Subjective:  Patient ID: Larry [name], male    DOB: August 23, 1950  Age: 69 y.o. MRN: [id number]  CC: Follow-up (3 month f/u on DM, HTN, Hyperlipidemia. Pt is not fasting. Pt states he has some swelling in bilateral feet and states he is still having issues with his bladder, some nights filling his urinal completely in the middle of the night and some nights not using the restroom at all. )  HPI  Paraplegia (HCC) Reports worsen muscle weakness (upper and lower extremities) which has led to difficulty with ADLs. He uses his wheelchair now.This had improved with home PT last year and he was able to use his crutches again.  He will like to resume sessions. He denies any fall in last 25month.  Bilateral leg edema Chronic, waxing and waning, resolved in past with furosemide and elevation, intermittent nocturia Denies any PND or cough or chest pain Maintains a low sodium diet.  Low risk for DVT Order echocardiogram Start furosemide and potassium x5days    Overactive bladder No change in nocturia with mybertriq. Current use of proscar Normal PSA Order urinalysis Consider referral to urology?  Wt Readings from Last 3 Encounters:  02/01/20 155 lb (70.3 kg)  11/14/19 154 lb (69.9 kg)  11/01/19 154 lb (69.9 kg)   Reviewed past Medical, Social and Family history today.  Outpatient Medications Prior to Visit  Medication Sig Dispense Refill   ACCU-CHEK AVIVA PLUS test strip TEST ONCE DAILY (IN THE  MORNING, FASTING) 100 strip 3   Accu-Chek FastClix Lancets MISC      aspirin 81 MG tablet Take 81 mg by mouth daily.     baclofen (LIORESAL) 20 MG tablet TAKE 1 TABLET BY MOUTH 3  TIMES DAILY 270 tablet 3   Blood Glucose Calibration (ACCU-CHEK AVIVA) SOLN      cloNIDine (CATAPRES) 0.1 MG tablet Take 1 tablet (0.1 mg total) by mouth 2 (two) times daily. 180 tablet 3   ferrous sulfate 325 (65 FE) MG tablet Take 1 tablet (325 mg total) by mouth daily with breakfast. 90 tablet 3    finasteride (PROSCAR) 5 MG tablet Take 1 tablet (5 mg total) by mouth daily. 90 tablet 3   Incontinence Supply Disposable (DEPEND ADJUSTABLE UNDERWEAR LG) MISC Use as needed. 30 each 12   Lancets Misc. (ACCU-CHEK FASTCLIX LANCET) KIT 1 Units by Does not apply route daily before breakfast. 1 kit 11   metFORMIN (GLUCOPHAGE) 850 MG tablet Take 1 tablet (850 mg total) by mouth 2 (two) times daily with a meal. 180 tablet 3   mirabegron ER (MYRBETRIQ) 25 MG TB24 tablet Take 1 tablet (25 mg total) by mouth daily. 30 tablet 5   Multiple Vitamin (MULTIVITAMIN) tablet Take 1 tablet by mouth daily.     Olmesartan-amLODIPine-HCTZ 40-10-12.5 MG TABS Take 1 tablet by mouth daily. 90 tablet 3   pregabalin (LYRICA) 75 MG capsule Take 1 capsule (75 mg total) by mouth 2 (two) times daily. 180 capsule 3   traZODone (DESYREL) 50 MG tablet Take 1 tablet (50 mg total) by mouth at bedtime. 90 tablet 3   rosuvastatin (CRESTOR) 5 MG tablet Take 1 tablet (5 mg total) by mouth daily. 90 tablet 3   No facility-administered medications prior to visit.    ROS See HPI  Objective:  BP 122/60 (BP Location: Left Arm, Patient Position: Sitting, Cuff Size: Normal)    Pulse 80    Temp (!) 97 F (36.1 C) (Temporal)    Ht '5\' 7"'  (1.702  m)    Wt 155 lb (70.3 kg) Comment: Per patient, currently in wheelchair 02/01/20 BG/RMA   SpO2 99%    BMI 24.28 kg/m   Physical Exam Cardiovascular:     Rate and Rhythm: Normal rate and regular rhythm.     Pulses: Normal pulses.     Heart sounds: Normal heart sounds.  Pulmonary:     Effort: Pulmonary effort is normal.     Breath sounds: Normal breath sounds.  Musculoskeletal:        General: No tenderness.     Right lower leg: Edema present.     Left lower leg: Edema present.     Comments: Wheelchair bound Atrophy of palmar, gluteal and quadricep muscles   Skin:    Findings: No erythema.  Neurological:     Mental Status: He is alert and oriented to person, place, and time.      Motor: Weakness and atrophy present. No tremor.    Assessment & Plan:  This visit occurred during the SARS-CoV-2 public health emergency.  Safety protocols were in place, including screening questions prior to the visit, additional usage of staff PPE, and extensive cleaning of exam room while observing appropriate contact time as indicated for disinfecting solutions.   Larry [name] was seen today for follow-up.  Diagnoses and all orders for this visit:  Bilateral leg edema -     ECHOCARDIOGRAM COMPLETE; Future -     furosemide (LASIX) 20 MG tablet; Take 1 tablet (20 mg total) by mouth daily. In AM -     potassium chloride SA (KLOR-CON) 20 MEQ tablet; Take 1 tablet (20 mEq total) by mouth daily.  Nocturia -     ECHOCARDIOGRAM COMPLETE; Future -     Urinalysis; Future  Essential hypertension -     ECHOCARDIOGRAM COMPLETE; Future  Paraplegia (HCC) -     Ambulatory referral to Home Health  Gait abnormality -     Ambulatory referral to [place]  Spinal stenosis, lumbar region, with neurogenic claudication -     Ambulatory referral to [place]  Overactive bladder    Problem List Items Addressed This Visit      Cardiovascular and Mediastinum   Hypertension   Relevant Medications   furosemide (LASIX) 20 MG tablet   Other Relevant Orders   ECHOCARDIOGRAM COMPLETE     Nervous and Auditory   Paraplegia (HCC)    Reports worsen muscle weakness (upper and lower extremities) which has led to difficulty with ADLs. He uses his wheelchair now.This had improved with home PT last year and he was able to use his crutches again.  He will like to resume sessions. He denies any fall in last 80month.      Relevant Orders   Ambulatory referral to Home Health     Genitourinary   Overactive bladder    No change in nocturia with mybertriq. Current use of proscar Normal PSA Order urinalysis Consider referral to urology?        Other   Bilateral leg edema - Primary    Chronic,  waxing and waning, resolved in past with furosemide and elevation, intermittent nocturia Denies any PND or cough or chest pain Maintains a low sodium diet.  Low risk for DVT Order echocardiogram Start furosemide and potassium x5days        Relevant Medications   furosemide (LASIX) 20 MG tablet   potassium chloride SA (KLOR-CON) 20 MEQ tablet   Other Relevant Orders   ECHOCARDIOGRAM COMPLETE   Gait  abnormality   Relevant Orders   Ambulatory referral to [place]   Spinal stenosis, lumbar region, with neurogenic claudication   Relevant Orders   Ambulatory referral to [place]    Other Visit Diagnoses    Nocturia       Relevant Orders   ECHOCARDIOGRAM COMPLETE   Urinalysis      Follow-up: Return in about 3 months (around 05/02/2020) for DM and HTN, hyperlipidemia.  Wilfred Lacy, NP

## 2020-02-01 NOTE — Patient Instructions (Signed)
Return urine sample to lab as soon as possible  You will be contacted to schedule appt with Home PT and for echocardiogram.  Echocardiogram is to determine if LE edema is due to CHF.  Take furosemide and potassium to improve LE edema.

## 2020-02-09 ENCOUNTER — Encounter: Payer: Self-pay | Admitting: Nurse Practitioner

## 2020-02-09 DIAGNOSIS — R600 Localized edema: Secondary | ICD-10-CM | POA: Insufficient documentation

## 2020-02-09 NOTE — Assessment & Plan Note (Addendum)
Chronic, waxing and waning, resolved in past with furosemide and elevation, intermittent nocturia Denies any PND or cough or chest pain Maintains a low sodium diet.  Low risk for DVT Order echocardiogram Start furosemide and potassium x5days

## 2020-02-09 NOTE — Assessment & Plan Note (Addendum)
No change in nocturia with mybertriq. Current use of proscar Normal PSA Order urinalysis Consider referral to urology?

## 2020-02-09 NOTE — Assessment & Plan Note (Signed)
Reports worsen muscle weakness (upper and lower extremities) which has led to difficulty with ADLs. He uses his wheelchair now.This had improved with home PT last year and he was able to use his crutches again.  He will like to resume sessions. He denies any fall in last 48months.

## 2020-02-11 ENCOUNTER — Other Ambulatory Visit: Payer: Self-pay | Admitting: Nurse Practitioner

## 2020-02-11 DIAGNOSIS — I152 Hypertension secondary to endocrine disorders: Secondary | ICD-10-CM

## 2020-02-12 NOTE — Telephone Encounter (Signed)
Pt request all 90 days supply rx send to [facility] and short term supply send to [facility].   Rx sent as pt request.

## 2020-02-14 DIAGNOSIS — Z86711 Personal history of pulmonary embolism: Secondary | ICD-10-CM | POA: Diagnosis not present

## 2020-02-14 DIAGNOSIS — S24103S Unspecified injury at T7-T10 level of thoracic spinal cord, sequela: Secondary | ICD-10-CM | POA: Diagnosis not present

## 2020-02-14 DIAGNOSIS — M48062 Spinal stenosis, lumbar region with neurogenic claudication: Secondary | ICD-10-CM | POA: Diagnosis not present

## 2020-02-14 DIAGNOSIS — E119 Type 2 diabetes mellitus without complications: Secondary | ICD-10-CM | POA: Diagnosis not present

## 2020-02-14 DIAGNOSIS — G35 Multiple sclerosis: Secondary | ICD-10-CM | POA: Diagnosis not present

## 2020-02-14 DIAGNOSIS — R600 Localized edema: Secondary | ICD-10-CM | POA: Diagnosis not present

## 2020-02-14 DIAGNOSIS — Z993 Dependence on wheelchair: Secondary | ICD-10-CM | POA: Diagnosis not present

## 2020-02-14 DIAGNOSIS — Z7984 Long term (current) use of oral hypoglycemic drugs: Secondary | ICD-10-CM | POA: Diagnosis not present

## 2020-02-14 DIAGNOSIS — G8220 Paraplegia, unspecified: Secondary | ICD-10-CM | POA: Diagnosis not present

## 2020-02-14 DIAGNOSIS — Z7982 Long term (current) use of aspirin: Secondary | ICD-10-CM | POA: Diagnosis not present

## 2020-02-14 DIAGNOSIS — Z9181 History of falling: Secondary | ICD-10-CM | POA: Diagnosis not present

## 2020-02-14 DIAGNOSIS — E785 Hyperlipidemia, unspecified: Secondary | ICD-10-CM | POA: Diagnosis not present

## 2020-02-14 DIAGNOSIS — I10 Essential (primary) hypertension: Secondary | ICD-10-CM | POA: Diagnosis not present

## 2020-02-14 DIAGNOSIS — M6259 Muscle wasting and atrophy, not elsewhere classified, multiple sites: Secondary | ICD-10-CM | POA: Diagnosis not present

## 2020-02-20 ENCOUNTER — Other Ambulatory Visit (INDEPENDENT_AMBULATORY_CARE_PROVIDER_SITE_OTHER): Payer: Medicare Other

## 2020-02-20 ENCOUNTER — Other Ambulatory Visit: Payer: Self-pay

## 2020-02-20 DIAGNOSIS — Z993 Dependence on wheelchair: Secondary | ICD-10-CM | POA: Diagnosis not present

## 2020-02-20 DIAGNOSIS — Z9181 History of falling: Secondary | ICD-10-CM | POA: Diagnosis not present

## 2020-02-20 DIAGNOSIS — S24103S Unspecified injury at T7-T10 level of thoracic spinal cord, sequela: Secondary | ICD-10-CM | POA: Diagnosis not present

## 2020-02-20 DIAGNOSIS — E119 Type 2 diabetes mellitus without complications: Secondary | ICD-10-CM | POA: Diagnosis not present

## 2020-02-20 DIAGNOSIS — I10 Essential (primary) hypertension: Secondary | ICD-10-CM | POA: Diagnosis not present

## 2020-02-20 DIAGNOSIS — R600 Localized edema: Secondary | ICD-10-CM | POA: Diagnosis not present

## 2020-02-20 DIAGNOSIS — R351 Nocturia: Secondary | ICD-10-CM | POA: Diagnosis not present

## 2020-02-20 DIAGNOSIS — Z7982 Long term (current) use of aspirin: Secondary | ICD-10-CM | POA: Diagnosis not present

## 2020-02-20 DIAGNOSIS — M48062 Spinal stenosis, lumbar region with neurogenic claudication: Secondary | ICD-10-CM | POA: Diagnosis not present

## 2020-02-20 DIAGNOSIS — E785 Hyperlipidemia, unspecified: Secondary | ICD-10-CM | POA: Diagnosis not present

## 2020-02-20 DIAGNOSIS — Z7984 Long term (current) use of oral hypoglycemic drugs: Secondary | ICD-10-CM | POA: Diagnosis not present

## 2020-02-20 DIAGNOSIS — G35 Multiple sclerosis: Secondary | ICD-10-CM | POA: Diagnosis not present

## 2020-02-20 DIAGNOSIS — G8220 Paraplegia, unspecified: Secondary | ICD-10-CM | POA: Diagnosis not present

## 2020-02-20 DIAGNOSIS — M6259 Muscle wasting and atrophy, not elsewhere classified, multiple sites: Secondary | ICD-10-CM | POA: Diagnosis not present

## 2020-02-20 DIAGNOSIS — Z86711 Personal history of pulmonary embolism: Secondary | ICD-10-CM | POA: Diagnosis not present

## 2020-02-20 LAB — URINALYSIS
Bilirubin Urine: NEGATIVE
Hgb urine dipstick: NEGATIVE
Ketones, ur: NEGATIVE
Leukocytes,Ua: NEGATIVE
Nitrite: NEGATIVE
Specific Gravity, Urine: 1.015 (ref 1.000–1.030)
Total Protein, Urine: NEGATIVE
Urine Glucose: NEGATIVE
Urobilinogen, UA: 0.2 (ref 0.0–1.0)
pH: 7 (ref 5.0–8.0)

## 2020-02-21 ENCOUNTER — Other Ambulatory Visit: Payer: Self-pay | Admitting: Neurology

## 2020-02-21 ENCOUNTER — Telehealth: Payer: Self-pay | Admitting: Nurse Practitioner

## 2020-02-21 ENCOUNTER — Other Ambulatory Visit: Payer: Self-pay | Admitting: *Deleted

## 2020-02-21 NOTE — Telephone Encounter (Signed)
Larry [name] is calling asking for PT orders for 1 week 1, 2 week 6 and 1 week 2, please advise. CB is [phone]

## 2020-02-21 NOTE — Telephone Encounter (Signed)
PT orders confirmed verbally.

## 2020-02-21 NOTE — Patient Outreach (Signed)
Aging Gracefully Program  02/21/2020  Jamisen Duerson 1951/05/01 [id number]   Placed call to client Larry [name] to complete Aging Gracefully RN Visit # 2 by phone, previously scheduled for 02/20/20; unable to reach client today by phone.    HIPAA compliant voice mail message left for patient, requesting return call back.  Explained scheduling conflict from yesterday and made client aware that I would re-attempt phone call again on Friday 02/23/20 between 11:00 am and 1:00 pm, and encouraged him to contact me sooner if this time is not convenient for him.  Plan:  Will re-attempt call to client on Friday 02/23/20 to complete Aging Gracefully RN Visit # 2  By phone  Oneta Rack, RN, BSN, [place] [facility]  [phone]

## 2020-02-22 ENCOUNTER — Other Ambulatory Visit: Payer: Self-pay

## 2020-02-22 ENCOUNTER — Ambulatory Visit (HOSPITAL_COMMUNITY): Payer: Medicare Other | Attending: Internal Medicine

## 2020-02-22 DIAGNOSIS — Z7984 Long term (current) use of oral hypoglycemic drugs: Secondary | ICD-10-CM | POA: Diagnosis not present

## 2020-02-22 DIAGNOSIS — R351 Nocturia: Secondary | ICD-10-CM | POA: Insufficient documentation

## 2020-02-22 DIAGNOSIS — I10 Essential (primary) hypertension: Secondary | ICD-10-CM | POA: Insufficient documentation

## 2020-02-22 DIAGNOSIS — G35 Multiple sclerosis: Secondary | ICD-10-CM | POA: Diagnosis not present

## 2020-02-22 DIAGNOSIS — E119 Type 2 diabetes mellitus without complications: Secondary | ICD-10-CM | POA: Diagnosis not present

## 2020-02-22 DIAGNOSIS — Z9181 History of falling: Secondary | ICD-10-CM | POA: Diagnosis not present

## 2020-02-22 DIAGNOSIS — M48062 Spinal stenosis, lumbar region with neurogenic claudication: Secondary | ICD-10-CM | POA: Diagnosis not present

## 2020-02-22 DIAGNOSIS — G8220 Paraplegia, unspecified: Secondary | ICD-10-CM | POA: Diagnosis not present

## 2020-02-22 DIAGNOSIS — E785 Hyperlipidemia, unspecified: Secondary | ICD-10-CM | POA: Diagnosis not present

## 2020-02-22 DIAGNOSIS — R600 Localized edema: Secondary | ICD-10-CM | POA: Diagnosis not present

## 2020-02-22 DIAGNOSIS — Z7982 Long term (current) use of aspirin: Secondary | ICD-10-CM | POA: Diagnosis not present

## 2020-02-22 DIAGNOSIS — S24103S Unspecified injury at T7-T10 level of thoracic spinal cord, sequela: Secondary | ICD-10-CM | POA: Diagnosis not present

## 2020-02-22 DIAGNOSIS — Z86711 Personal history of pulmonary embolism: Secondary | ICD-10-CM | POA: Diagnosis not present

## 2020-02-22 DIAGNOSIS — Z993 Dependence on wheelchair: Secondary | ICD-10-CM | POA: Diagnosis not present

## 2020-02-22 DIAGNOSIS — M6259 Muscle wasting and atrophy, not elsewhere classified, multiple sites: Secondary | ICD-10-CM | POA: Diagnosis not present

## 2020-02-22 LAB — ECHOCARDIOGRAM COMPLETE
Area-P 1/2: 3.65 cm2
S' Lateral: 2.8 cm

## 2020-02-23 ENCOUNTER — Telehealth: Payer: Self-pay | Admitting: Nurse Practitioner

## 2020-02-23 ENCOUNTER — Other Ambulatory Visit: Payer: Self-pay | Admitting: *Deleted

## 2020-02-23 ENCOUNTER — Encounter: Payer: Self-pay | Admitting: *Deleted

## 2020-02-23 DIAGNOSIS — R351 Nocturia: Secondary | ICD-10-CM

## 2020-02-23 NOTE — Telephone Encounter (Signed)
Patient would like referral to urology. States he discussed the frequent urination with [name] at his last visit.

## 2020-02-23 NOTE — Telephone Encounter (Signed)
Ok to order 

## 2020-02-23 NOTE — Patient Instructions (Addendum)
Larry [name],  I am so glad that you have talked to your doctor about your need for Physical Therapy-- I am so glad you are finding it helpful!  I am also very glad to hear that you have not had any falls and are finding the interventions provided by the Aging Gracefully OT helpful.  I have confirmed that you are eligible for the Aging Gracefully program!  I will contact you in the next few weeks to schedule our next RN visit-- keep up the great work!   CLIENT/RN ACTION PLAN - FALL PREVENTION  Registered Nurse:  Reginia Naas RN Date: February 23, 2020  Client Name: Larry [name] Client ID: [id number]   Target Area:  FALL PREVENTION    Why Problem May Occur: Mobility issues; wheelchair use; medications   Target Goal: No Falls   STRATEGIES Coping Strategies Ideas  Change Positions Slowly: lying to sitting, sitting to standing  Changing positions can make people lightheaded.  When getting up in the morning sit for a few minutes, before getting in your wheelchair.   Stand for a few minutes before walking and hold on to sturdy furniture or countertop.   Timmothy S[place] [place] for anything: take your time  For tasks you don't do every day, visualize yourself completing the task before you begin it; think about how you will accomplish the task and problems you may encounter  Drink water- stay hydrated  Dehydration can make people dizzy.  Ask your healthcare provider how much water you can drink.  Decrease caffeine, caffeine makes you urinate a lot, which can make you dehydrated.    If you fall, tell someone  Tell a friend or family member even if you didn't get hurt.  Tell your healthcare provider if you fall.  They can help you figure out why.  Write down any falls you experience on your calendar along with the circumstances around the fall,so you can keep track of what happened to make you fall  Use your life alert system and keep your cell phone nearby you at all times  Get your vision and  hearing checked  Poor vision and hearing loss can make people fall.  Glaucoma and diabetes can cause poor vision.  Great job getting your recent eye exam- make it a part of your annual routine to have an eye exam  Other     Prevention Ideas  Review your Medicines  Many medicines can make you dizzy or sleepy and increase your risk of falling.  Your Aging Gracefully Nurse will look at your medications and see if you are taking any that might cause falling.  Your blood pressure medications and the medicine to help you sleep could cause you to feel light headed; if you start feeling this way, tell your doctor  Activity and Exercise  Aging Gracefully exercises- do the ones that you are able  Walking (inside or outside)  [employer]:  cooking, cleaning, laundry  Exercise while watching TV  Swimming or water aerobics.  Continue doing your personal exercise routine with your weights at home  Once gyms are open again, consider calling your insurance program to find our about the wellness benefit; maybe you and a friend could start going together  Strengthen Bones  Exercise makes your bones stronger.  Vitamin D and Calcium make your bones stronger.  Ask your Healthcare provider if you should be taking them.  Your body makes vitamin D from the sun.  Sit in the sun for  10 minutes every day (without sunscreen).    Control your urine   Prevent constipation  Cut back on caffeinated drinks.  Don't wait to urinate.  If diabetic, control your blood sugar.  Ask your Aging Gracefully Nurse and Healthcare Provider  about urine control.  Try the Kegel exercises we talked about during our visit; these exercises strengthen the muscles that start and stop your urine flow  Control your blood sugar (if you are diabetic)  High blood sugar can cause frequent urination, poor vision, and numbness in your feet, which can make you fall.    Low blood sugar can cause confusion  and dizziness.  Sit down and take your blood sugar if you experience any of these symptoms  Other coping strategies 1.Always wear good sturdy shoes with good soles  2.If your feet/ legs continue to have swelling, let your doctor know   3. Talk to your doctor about options to receive physical therapy in your home-- you may want to look in to private duty physical therapy.  Your doctor may also have a physical therapist located at the office  4. Talk to your doctor about your sleep interruptions    PRACTICE It is important to practice the strategies so we can determine if they will be effective in helping to reach the goal.    Follow these specific recommendations:        If strategy does not work the first time, try it again.     We may make some changes over the next few sessions.      Oneta Rack, RN, BSN, [place] Coordinator [facility]

## 2020-02-23 NOTE — Patient Outreach (Addendum)
Aging Gracefully Program  RN Visit # 2  02/23/2020  Larry [name] 11-02-1950 [id number]  Visit:   RN Visit # 2  Start Time:   11:25 am End Time:   11:40 am Total Minutes:   20 minutes  Readiness To Change Score:  Readiness to Change Score: 10  Universal RN Interventions: Calendar Distribution: Yes (reviewed with client) Exercise Review: Yes (reviewed with client) PCP Advocacy/Support: Yes (reviewed with client) Fall Prevention: Yes (reviewed with client)  Healthcare Provider Communication: Did Higher education careers adviser With [place] Provider?: No  Clinician View of Client Situation: Clinician View Of Client Situation: Larry [name] is making great progress in meeting his previously established Aging Gracefully goals around fall prevention and has experienced no new or recent falls.  He has talked with his doctor about starting a PT program at home and is currently being visited by home PT twice a week which is helping him greatly. Client tells me today that he has been told by the [facility] home modification team that he does not qualify for the aging Gracefully program.   Client's View of His/Her Situation: Client View Of His/Her Situation: "Things are going very well; I am using the home devices the OT gave me and they are helping; I have not had any falls and have followed up with my doctor about my feet swelling and about getting PT.  She ordered PT for me at home and they are coming to see me twice a week-- that is really helping me.  I feel better during the day and am also sleeping and resting better.  The [facility] team sent me a letter and told me that I do not qualify for the aging Gracefully program because my income is too high.  I am very surprised you even called me."  OT Update: Contacted Aging Gracefully OT and verbally updated on successful completion of today's Aging Designer, fashion/clothing Visit # 2; contacted Aging Gracefully leadership to confirm client's eligibility for Aging Gracefully  program  Session Summary: Larry [name] remains very engaged in his ongoing safety and overall care as he ages.  He is appreciative of the Aging Gracefully interventions completed by OT and RN thus far.  I have asked for clarification from the Aging Gracefully leadership team to confirm patient's participation/ eligibility and will proceed accordingly once I hear back around same.  Addendum 4:30 pm:  Received confirmation from [place] team that does in fact meet eligibility requirements to participat in Aging Gracefully program-- contacted OT Golden Circle by phone to update  Oneta Rack, RN, BSN, [place] [facility]  [phone]

## 2020-02-27 DIAGNOSIS — Z7984 Long term (current) use of oral hypoglycemic drugs: Secondary | ICD-10-CM | POA: Diagnosis not present

## 2020-02-27 DIAGNOSIS — E785 Hyperlipidemia, unspecified: Secondary | ICD-10-CM | POA: Diagnosis not present

## 2020-02-27 DIAGNOSIS — M6259 Muscle wasting and atrophy, not elsewhere classified, multiple sites: Secondary | ICD-10-CM | POA: Diagnosis not present

## 2020-02-27 DIAGNOSIS — M48062 Spinal stenosis, lumbar region with neurogenic claudication: Secondary | ICD-10-CM | POA: Diagnosis not present

## 2020-02-27 DIAGNOSIS — Z993 Dependence on wheelchair: Secondary | ICD-10-CM | POA: Diagnosis not present

## 2020-02-27 DIAGNOSIS — G8220 Paraplegia, unspecified: Secondary | ICD-10-CM | POA: Diagnosis not present

## 2020-02-27 DIAGNOSIS — Z9181 History of falling: Secondary | ICD-10-CM | POA: Diagnosis not present

## 2020-02-27 DIAGNOSIS — R600 Localized edema: Secondary | ICD-10-CM | POA: Diagnosis not present

## 2020-02-27 DIAGNOSIS — Z7982 Long term (current) use of aspirin: Secondary | ICD-10-CM | POA: Diagnosis not present

## 2020-02-27 DIAGNOSIS — G35 Multiple sclerosis: Secondary | ICD-10-CM | POA: Diagnosis not present

## 2020-02-27 DIAGNOSIS — E119 Type 2 diabetes mellitus without complications: Secondary | ICD-10-CM | POA: Diagnosis not present

## 2020-02-27 DIAGNOSIS — Z86711 Personal history of pulmonary embolism: Secondary | ICD-10-CM | POA: Diagnosis not present

## 2020-02-27 DIAGNOSIS — S24103S Unspecified injury at T7-T10 level of thoracic spinal cord, sequela: Secondary | ICD-10-CM | POA: Diagnosis not present

## 2020-02-27 DIAGNOSIS — I10 Essential (primary) hypertension: Secondary | ICD-10-CM | POA: Diagnosis not present

## 2020-02-29 ENCOUNTER — Other Ambulatory Visit: Payer: Self-pay | Admitting: *Deleted

## 2020-02-29 DIAGNOSIS — Z86711 Personal history of pulmonary embolism: Secondary | ICD-10-CM | POA: Diagnosis not present

## 2020-02-29 DIAGNOSIS — M48062 Spinal stenosis, lumbar region with neurogenic claudication: Secondary | ICD-10-CM | POA: Diagnosis not present

## 2020-02-29 DIAGNOSIS — E785 Hyperlipidemia, unspecified: Secondary | ICD-10-CM | POA: Diagnosis not present

## 2020-02-29 DIAGNOSIS — Z7984 Long term (current) use of oral hypoglycemic drugs: Secondary | ICD-10-CM | POA: Diagnosis not present

## 2020-02-29 DIAGNOSIS — G8220 Paraplegia, unspecified: Secondary | ICD-10-CM | POA: Diagnosis not present

## 2020-02-29 DIAGNOSIS — Z993 Dependence on wheelchair: Secondary | ICD-10-CM | POA: Diagnosis not present

## 2020-02-29 DIAGNOSIS — E119 Type 2 diabetes mellitus without complications: Secondary | ICD-10-CM | POA: Diagnosis not present

## 2020-02-29 DIAGNOSIS — I10 Essential (primary) hypertension: Secondary | ICD-10-CM | POA: Diagnosis not present

## 2020-02-29 DIAGNOSIS — G35 Multiple sclerosis: Secondary | ICD-10-CM | POA: Diagnosis not present

## 2020-02-29 DIAGNOSIS — Z7982 Long term (current) use of aspirin: Secondary | ICD-10-CM | POA: Diagnosis not present

## 2020-02-29 DIAGNOSIS — S24103S Unspecified injury at T7-T10 level of thoracic spinal cord, sequela: Secondary | ICD-10-CM | POA: Diagnosis not present

## 2020-02-29 DIAGNOSIS — M6259 Muscle wasting and atrophy, not elsewhere classified, multiple sites: Secondary | ICD-10-CM | POA: Diagnosis not present

## 2020-02-29 DIAGNOSIS — Z9181 History of falling: Secondary | ICD-10-CM | POA: Diagnosis not present

## 2020-02-29 DIAGNOSIS — R600 Localized edema: Secondary | ICD-10-CM | POA: Diagnosis not present

## 2020-02-29 NOTE — Patient Outreach (Signed)
Aging Gracefully Program  02/29/2020  Larry [name] 1951/02/22 [id number]   Placed call to client  to schedule Aging Gracefully RN # 3 home visit; HIPAA/ identity verified.     Visit scheduled with [name] for Tuesday, April 02, 2020 at 2:00 pm; verified patient's address and explained that I would contact him by phone on day of scheduled visit prior to arriving at his home, to complete coronavirus questionnaire screening tool; confirmed that client has a mask available at his home for use during scheduled visit.   Provided my direct contact information to patient, should client wish to contact me prior to scheduled home visit.   Plan:   Scheduled RN Visit # 3 home visit on Tuesday April 02, 2020 between 1:45-2:15 pm  Oneta Rack, RN, BSN, [place] [facility]  [phone]

## 2020-03-01 ENCOUNTER — Other Ambulatory Visit: Payer: Self-pay | Admitting: Occupational Therapy

## 2020-03-01 ENCOUNTER — Other Ambulatory Visit: Payer: Self-pay

## 2020-03-01 NOTE — Patient Instructions (Signed)
Goals Addressed            This Visit's Progress    COMPLETED: Patient Stated       Safety with accessing stove knobs at back of stove top.  ACTION PLANNING - CUSTOM  Target Problem Area:  Safety with accessing knobs on stove top seated in a wheelchair  Why Problem May Occur:  Because Mr. Schauer gets around in a wheelchair and it is more difficult for him to stand, this puts him at risk for burning himself when he reaches to turn stove eyes on and off from a wheelchair level.    Target Goal: Increase safety from a seated position for turning on/off stove top controls   STRATEGIES   Modifying your home environment and making it safe: DO: DON'T:  Use the extended knob turner  Reach across a hot eye to turn off/on a burner eye   Stand to turn them off and on--may fall            Practice It is important to practice the strategies so we can determine if they will be effective in helping to reach your goal. Follow these specific recommendations: 1.Consistently use extended knob turner to get use to use it for your stove   If a strategy does not work the first time, try it again and again (and maybe again). We may make some changes over the next few sessions, based on how they work.   Golden Circle, OTR/L Aging Gracefully [phone]       COMPLETED: Patient Stated       Safety and independence with accessing items on higher shelves in kitchen.  ACTION PLANNING - CUSTOM  Target Problem Area: Safety with attaining items from upper cabinets  Why Problem May Occur: Client mainly gets around in a wheelchair. He can stand but needs to hold onto a steady surface with at least one hand when doing so and two hands is preferable for holding on for safety. However, he cannot hold on with one hand and be able to reach all the items he may want safely in his upper kitchen cabinets.    Target Goal: Safety in kitchen, not having to stand to reach items in upper kitchen  cabinets. A free-standing cabinet in his kitchen will help with this.   STRATEGIES Saving Your Energy: DO: DON'T:  Gather your items in kitchen from wheelchair level. Gather your items in kitchen from standing level.  Gather all the items you need from your free-standing kitchen cabinet at one time. Keep having to go back and forth from free-standing kitchen cabinet to get items or put them up.            Modifying your home environment and making it safe: DO: DON'T:  Use your new free standing kitchen cabinet to store items you need often from your upper kitchen cabinets Stand to reach items up high in your kitchen cabinets                  Practice It is important to practice the strategies so we can determine if they will be effective in helping to reach your goal. Follow these specific recommendations: 1. Have someone help you move the items you want out of your upper kitchen cabinets to your free-standing cabinet. 2.Gather all items you want out of your free-standing kitchen cabinet that you need at one time and place on your kitchen table or in your lap.  If a strategy does  not work the first time, try it again and again (and maybe again). We may make some changes over the next few sessions, based on how they work.   Golden Circle, OTR/L       03/01/2020

## 2020-03-01 NOTE — Patient Outreach (Signed)
Aging Gracefully Program  OT Follow-Up Visit  03/01/2020  Ferd Horrigan 13-Oct-1950 [id number]  Visit:  3- Third Visit  Start Time:  1100 End Time:  1200 Total Minutes:  60   Readiness to Change Score :  Readiness to Change Score: 10   Durable Medical Equipment: Adaptive Equipment: Other (free standing kitchen cabinet) Adaptive Equipment Distribution Date: 03/01/20  Patient Education: Education Provided: Yes Education Details: Getting up from a fall. The method on the handout will not work for him due to the weakness in his legs, but we did talk about other ways, including ones he has used before (relies alot on his upper body strength). Check for safety checklist also provided Person(s) Educated: Patient Comprehension: Verbalized Understanding  Goals:  Goals Addressed            This Visit's Progress   . COMPLETED: Patient Stated       Safety with accessing stove knobs at back of stove top.  ACTION PLANNING - CUSTOM  Target Problem Area:  Safety with accessing knobs on stove top seated in a wheelchair  Why Problem May Occur:  Because Mr. Chow gets around in a wheelchair and it is more difficult for him to stand, this puts him at risk for burning himself when he reaches to turn stove eyes on and off from a wheelchair level.    Target Goal: Increase safety from a seated position for turning on/off stove top controls   STRATEGIES   Modifying your home environment and making it safe: DO: DON'T:  Use the extended knob turner  Reach across a hot eye to turn off/on a burner eye   Stand to turn them off and on--may fall            Practice It is important to practice the strategies so we can determine if they will be effective in helping to reach your goal. Follow these specific recommendations: 1.Consistently use extended knob turner to get use to use it for your stove 2. 3.  If a strategy does not work the first time, try it again and again (and maybe  again). We may make some changes over the next few sessions, based on how they work.   Golden Circle, OTR/L Aging Gracefully [phone]      . COMPLETED: Patient Stated       Safety and independence with accessing items on higher shelves in kitchen.  ACTION PLANNING - CUSTOM  Target Problem Area: Safety with attaining items from upper cabinets  Why Problem May Occur: Client mainly gets around in a wheelchair. He can stand but needs to hold onto a steady surface with at least one hand when doing so and two hands is preferable for holding on for safety. However, he cannot hold on with one hand and be able to reach all the items he may want safely in his upper kitchen cabinets.    Target Goal: Safety in kitchen, not having to stand to reach items in upper kitchen cabinets. A free-standing cabinet in his kitchen will help with this.   STRATEGIES Saving Your Energy: DO: DON'T:  Gather your items in kitchen from wheelchair level. Gather your items in kitchen from standing level.  Gather all the items you need from your free-standing kitchen cabinet at one time. Keep having to go back and forth from free-standing kitchen cabinet to get items or put them up.            Modifying your home environment and making  it safe: DO: DON'T:  Use your new free standing kitchen cabinet to store items you need often from your upper kitchen cabinets Stand to reach items up high in your kitchen cabinets                  Practice It is important to practice the strategies so we can determine if they will be effective in helping to reach your goal. Follow these specific recommendations: 1. Have someone help you move the items you want out of your upper kitchen cabinets to your free-standing cabinet. 2.Gather all items you want out of your free-standing kitchen cabinet that you need at one time and place on your kitchen table or in your lap.  If a strategy does not work the first time, try it  again and again (and maybe again). We may make some changes over the next few sessions, based on how they work.   Golden Circle, OTR/L       03/01/2020       Post Clinical Reasoning: Client Action (Goal) Three Interventions: Mr. Capaldi was provided with a free-standing kitchen cabinet and helped to take items out his upper cabinets and place them in this new cabinet Did Client Try?: Yes Targeted Problem Area Status: A Lot Better Clinician View Of Client Situation:: Modified extended knob turner. Widened the opening so it would fit his knobs better and placed a non-skid piece of shelf liner on one portion of it so he can also use it to punch buttons on the stove for oven and timer. Worked with Mr.Pevey on where he thought cabinet would go best--his idea was to catty corner it, and this worked Recruitment consultant. I then helpd him by moving items from upper kitchen cabinets to this new new cabinet as well as lining shelves of it. He currenlty can get items out of it, place them on his kitchen table and then move them from table to countery top if need be. Client View Of His/Her Situation:: Mr. Bergum was very happy with his new cabinet. We did talk about a possible wheel chair tray where he could hold items when moving aorund in his wheelchair. The knob turner will now work better for him too.He started HHPT last week and is happy about this. Next Visit Plan:: Perhaps a wheel chair tray if I can find something that may work or will wait until some of his home modifications are done and then will see for shower goal

## 2020-03-05 DIAGNOSIS — G35 Multiple sclerosis: Secondary | ICD-10-CM | POA: Diagnosis not present

## 2020-03-05 DIAGNOSIS — M48062 Spinal stenosis, lumbar region with neurogenic claudication: Secondary | ICD-10-CM | POA: Diagnosis not present

## 2020-03-05 DIAGNOSIS — Z9181 History of falling: Secondary | ICD-10-CM | POA: Diagnosis not present

## 2020-03-05 DIAGNOSIS — Z86711 Personal history of pulmonary embolism: Secondary | ICD-10-CM | POA: Diagnosis not present

## 2020-03-05 DIAGNOSIS — R600 Localized edema: Secondary | ICD-10-CM | POA: Diagnosis not present

## 2020-03-05 DIAGNOSIS — G8220 Paraplegia, unspecified: Secondary | ICD-10-CM | POA: Diagnosis not present

## 2020-03-05 DIAGNOSIS — Z7984 Long term (current) use of oral hypoglycemic drugs: Secondary | ICD-10-CM | POA: Diagnosis not present

## 2020-03-05 DIAGNOSIS — I10 Essential (primary) hypertension: Secondary | ICD-10-CM | POA: Diagnosis not present

## 2020-03-05 DIAGNOSIS — E785 Hyperlipidemia, unspecified: Secondary | ICD-10-CM | POA: Diagnosis not present

## 2020-03-05 DIAGNOSIS — E119 Type 2 diabetes mellitus without complications: Secondary | ICD-10-CM | POA: Diagnosis not present

## 2020-03-05 DIAGNOSIS — M6259 Muscle wasting and atrophy, not elsewhere classified, multiple sites: Secondary | ICD-10-CM | POA: Diagnosis not present

## 2020-03-05 DIAGNOSIS — Z993 Dependence on wheelchair: Secondary | ICD-10-CM | POA: Diagnosis not present

## 2020-03-05 DIAGNOSIS — S24103S Unspecified injury at T7-T10 level of thoracic spinal cord, sequela: Secondary | ICD-10-CM | POA: Diagnosis not present

## 2020-03-05 DIAGNOSIS — Z7982 Long term (current) use of aspirin: Secondary | ICD-10-CM | POA: Diagnosis not present

## 2020-03-06 ENCOUNTER — Other Ambulatory Visit: Payer: Self-pay | Admitting: Nurse Practitioner

## 2020-03-06 DIAGNOSIS — G629 Polyneuropathy, unspecified: Secondary | ICD-10-CM

## 2020-03-06 DIAGNOSIS — I152 Hypertension secondary to endocrine disorders: Secondary | ICD-10-CM

## 2020-03-07 DIAGNOSIS — M48062 Spinal stenosis, lumbar region with neurogenic claudication: Secondary | ICD-10-CM | POA: Diagnosis not present

## 2020-03-07 DIAGNOSIS — S24103S Unspecified injury at T7-T10 level of thoracic spinal cord, sequela: Secondary | ICD-10-CM | POA: Diagnosis not present

## 2020-03-07 DIAGNOSIS — G35 Multiple sclerosis: Secondary | ICD-10-CM | POA: Diagnosis not present

## 2020-03-07 DIAGNOSIS — Z993 Dependence on wheelchair: Secondary | ICD-10-CM | POA: Diagnosis not present

## 2020-03-07 DIAGNOSIS — G8220 Paraplegia, unspecified: Secondary | ICD-10-CM | POA: Diagnosis not present

## 2020-03-07 DIAGNOSIS — R600 Localized edema: Secondary | ICD-10-CM | POA: Diagnosis not present

## 2020-03-07 DIAGNOSIS — E785 Hyperlipidemia, unspecified: Secondary | ICD-10-CM | POA: Diagnosis not present

## 2020-03-07 DIAGNOSIS — Z7982 Long term (current) use of aspirin: Secondary | ICD-10-CM | POA: Diagnosis not present

## 2020-03-07 DIAGNOSIS — Z86711 Personal history of pulmonary embolism: Secondary | ICD-10-CM | POA: Diagnosis not present

## 2020-03-07 DIAGNOSIS — Z7984 Long term (current) use of oral hypoglycemic drugs: Secondary | ICD-10-CM | POA: Diagnosis not present

## 2020-03-07 DIAGNOSIS — I10 Essential (primary) hypertension: Secondary | ICD-10-CM | POA: Diagnosis not present

## 2020-03-07 DIAGNOSIS — Z9181 History of falling: Secondary | ICD-10-CM | POA: Diagnosis not present

## 2020-03-07 DIAGNOSIS — E119 Type 2 diabetes mellitus without complications: Secondary | ICD-10-CM | POA: Diagnosis not present

## 2020-03-07 DIAGNOSIS — M6259 Muscle wasting and atrophy, not elsewhere classified, multiple sites: Secondary | ICD-10-CM | POA: Diagnosis not present

## 2020-03-08 ENCOUNTER — Other Ambulatory Visit: Payer: Self-pay | Admitting: Neurology

## 2020-03-09 ENCOUNTER — Other Ambulatory Visit: Payer: Self-pay | Admitting: Nurse Practitioner

## 2020-03-09 DIAGNOSIS — E1151 Type 2 diabetes mellitus with diabetic peripheral angiopathy without gangrene: Secondary | ICD-10-CM

## 2020-03-11 DIAGNOSIS — Z7982 Long term (current) use of aspirin: Secondary | ICD-10-CM | POA: Diagnosis not present

## 2020-03-11 DIAGNOSIS — E119 Type 2 diabetes mellitus without complications: Secondary | ICD-10-CM | POA: Diagnosis not present

## 2020-03-11 DIAGNOSIS — G8220 Paraplegia, unspecified: Secondary | ICD-10-CM | POA: Diagnosis not present

## 2020-03-11 DIAGNOSIS — E785 Hyperlipidemia, unspecified: Secondary | ICD-10-CM | POA: Diagnosis not present

## 2020-03-11 DIAGNOSIS — M6259 Muscle wasting and atrophy, not elsewhere classified, multiple sites: Secondary | ICD-10-CM | POA: Diagnosis not present

## 2020-03-11 DIAGNOSIS — M48062 Spinal stenosis, lumbar region with neurogenic claudication: Secondary | ICD-10-CM | POA: Diagnosis not present

## 2020-03-11 DIAGNOSIS — S24103S Unspecified injury at T7-T10 level of thoracic spinal cord, sequela: Secondary | ICD-10-CM | POA: Diagnosis not present

## 2020-03-11 DIAGNOSIS — Z9181 History of falling: Secondary | ICD-10-CM | POA: Diagnosis not present

## 2020-03-11 DIAGNOSIS — I10 Essential (primary) hypertension: Secondary | ICD-10-CM | POA: Diagnosis not present

## 2020-03-11 DIAGNOSIS — G35 Multiple sclerosis: Secondary | ICD-10-CM | POA: Diagnosis not present

## 2020-03-11 DIAGNOSIS — Z993 Dependence on wheelchair: Secondary | ICD-10-CM | POA: Diagnosis not present

## 2020-03-11 DIAGNOSIS — R600 Localized edema: Secondary | ICD-10-CM | POA: Diagnosis not present

## 2020-03-11 DIAGNOSIS — Z7984 Long term (current) use of oral hypoglycemic drugs: Secondary | ICD-10-CM | POA: Diagnosis not present

## 2020-03-11 DIAGNOSIS — Z86711 Personal history of pulmonary embolism: Secondary | ICD-10-CM | POA: Diagnosis not present

## 2020-03-12 ENCOUNTER — Other Ambulatory Visit: Payer: Self-pay | Admitting: Nurse Practitioner

## 2020-03-12 DIAGNOSIS — K219 Gastro-esophageal reflux disease without esophagitis: Secondary | ICD-10-CM

## 2020-03-14 DIAGNOSIS — R600 Localized edema: Secondary | ICD-10-CM | POA: Diagnosis not present

## 2020-03-14 DIAGNOSIS — I10 Essential (primary) hypertension: Secondary | ICD-10-CM | POA: Diagnosis not present

## 2020-03-14 DIAGNOSIS — Z7984 Long term (current) use of oral hypoglycemic drugs: Secondary | ICD-10-CM | POA: Diagnosis not present

## 2020-03-14 DIAGNOSIS — Z7982 Long term (current) use of aspirin: Secondary | ICD-10-CM | POA: Diagnosis not present

## 2020-03-14 DIAGNOSIS — M6259 Muscle wasting and atrophy, not elsewhere classified, multiple sites: Secondary | ICD-10-CM | POA: Diagnosis not present

## 2020-03-14 DIAGNOSIS — G8220 Paraplegia, unspecified: Secondary | ICD-10-CM | POA: Diagnosis not present

## 2020-03-14 DIAGNOSIS — Z86711 Personal history of pulmonary embolism: Secondary | ICD-10-CM | POA: Diagnosis not present

## 2020-03-14 DIAGNOSIS — G35 Multiple sclerosis: Secondary | ICD-10-CM | POA: Diagnosis not present

## 2020-03-14 DIAGNOSIS — E119 Type 2 diabetes mellitus without complications: Secondary | ICD-10-CM | POA: Diagnosis not present

## 2020-03-14 DIAGNOSIS — S24103S Unspecified injury at T7-T10 level of thoracic spinal cord, sequela: Secondary | ICD-10-CM | POA: Diagnosis not present

## 2020-03-14 DIAGNOSIS — Z9181 History of falling: Secondary | ICD-10-CM | POA: Diagnosis not present

## 2020-03-14 DIAGNOSIS — M48062 Spinal stenosis, lumbar region with neurogenic claudication: Secondary | ICD-10-CM | POA: Diagnosis not present

## 2020-03-14 DIAGNOSIS — Z993 Dependence on wheelchair: Secondary | ICD-10-CM | POA: Diagnosis not present

## 2020-03-14 DIAGNOSIS — E785 Hyperlipidemia, unspecified: Secondary | ICD-10-CM | POA: Diagnosis not present

## 2020-03-14 NOTE — Telephone Encounter (Signed)
Ask if he requested for this refill?

## 2020-03-18 NOTE — Telephone Encounter (Signed)
LVM for patient to return call. 

## 2020-03-19 DIAGNOSIS — M48062 Spinal stenosis, lumbar region with neurogenic claudication: Secondary | ICD-10-CM | POA: Diagnosis not present

## 2020-03-19 DIAGNOSIS — Z7982 Long term (current) use of aspirin: Secondary | ICD-10-CM | POA: Diagnosis not present

## 2020-03-19 DIAGNOSIS — Z86711 Personal history of pulmonary embolism: Secondary | ICD-10-CM | POA: Diagnosis not present

## 2020-03-19 DIAGNOSIS — E785 Hyperlipidemia, unspecified: Secondary | ICD-10-CM | POA: Diagnosis not present

## 2020-03-19 DIAGNOSIS — Z9181 History of falling: Secondary | ICD-10-CM | POA: Diagnosis not present

## 2020-03-19 DIAGNOSIS — R600 Localized edema: Secondary | ICD-10-CM | POA: Diagnosis not present

## 2020-03-19 DIAGNOSIS — E119 Type 2 diabetes mellitus without complications: Secondary | ICD-10-CM | POA: Diagnosis not present

## 2020-03-19 DIAGNOSIS — G35 Multiple sclerosis: Secondary | ICD-10-CM | POA: Diagnosis not present

## 2020-03-19 DIAGNOSIS — S24103S Unspecified injury at T7-T10 level of thoracic spinal cord, sequela: Secondary | ICD-10-CM | POA: Diagnosis not present

## 2020-03-19 DIAGNOSIS — Z993 Dependence on wheelchair: Secondary | ICD-10-CM | POA: Diagnosis not present

## 2020-03-19 DIAGNOSIS — Z7984 Long term (current) use of oral hypoglycemic drugs: Secondary | ICD-10-CM | POA: Diagnosis not present

## 2020-03-19 DIAGNOSIS — I10 Essential (primary) hypertension: Secondary | ICD-10-CM | POA: Diagnosis not present

## 2020-03-19 DIAGNOSIS — M6259 Muscle wasting and atrophy, not elsewhere classified, multiple sites: Secondary | ICD-10-CM | POA: Diagnosis not present

## 2020-03-19 DIAGNOSIS — G8220 Paraplegia, unspecified: Secondary | ICD-10-CM | POA: Diagnosis not present

## 2020-03-21 DIAGNOSIS — Z86711 Personal history of pulmonary embolism: Secondary | ICD-10-CM | POA: Diagnosis not present

## 2020-03-21 DIAGNOSIS — M48062 Spinal stenosis, lumbar region with neurogenic claudication: Secondary | ICD-10-CM | POA: Diagnosis not present

## 2020-03-21 DIAGNOSIS — R600 Localized edema: Secondary | ICD-10-CM | POA: Diagnosis not present

## 2020-03-21 DIAGNOSIS — M6259 Muscle wasting and atrophy, not elsewhere classified, multiple sites: Secondary | ICD-10-CM | POA: Diagnosis not present

## 2020-03-21 DIAGNOSIS — Z9181 History of falling: Secondary | ICD-10-CM | POA: Diagnosis not present

## 2020-03-21 DIAGNOSIS — G35 Multiple sclerosis: Secondary | ICD-10-CM | POA: Diagnosis not present

## 2020-03-21 DIAGNOSIS — Z993 Dependence on wheelchair: Secondary | ICD-10-CM | POA: Diagnosis not present

## 2020-03-21 DIAGNOSIS — G8220 Paraplegia, unspecified: Secondary | ICD-10-CM | POA: Diagnosis not present

## 2020-03-21 DIAGNOSIS — E119 Type 2 diabetes mellitus without complications: Secondary | ICD-10-CM | POA: Diagnosis not present

## 2020-03-21 DIAGNOSIS — I10 Essential (primary) hypertension: Secondary | ICD-10-CM | POA: Diagnosis not present

## 2020-03-21 DIAGNOSIS — Z7984 Long term (current) use of oral hypoglycemic drugs: Secondary | ICD-10-CM | POA: Diagnosis not present

## 2020-03-21 DIAGNOSIS — E785 Hyperlipidemia, unspecified: Secondary | ICD-10-CM | POA: Diagnosis not present

## 2020-03-21 DIAGNOSIS — Z7982 Long term (current) use of aspirin: Secondary | ICD-10-CM | POA: Diagnosis not present

## 2020-03-21 DIAGNOSIS — S24103S Unspecified injury at T7-T10 level of thoracic spinal cord, sequela: Secondary | ICD-10-CM | POA: Diagnosis not present

## 2020-03-26 DIAGNOSIS — Z7984 Long term (current) use of oral hypoglycemic drugs: Secondary | ICD-10-CM | POA: Diagnosis not present

## 2020-03-26 DIAGNOSIS — Z9181 History of falling: Secondary | ICD-10-CM | POA: Diagnosis not present

## 2020-03-26 DIAGNOSIS — R600 Localized edema: Secondary | ICD-10-CM | POA: Diagnosis not present

## 2020-03-26 DIAGNOSIS — Z7982 Long term (current) use of aspirin: Secondary | ICD-10-CM | POA: Diagnosis not present

## 2020-03-26 DIAGNOSIS — G35 Multiple sclerosis: Secondary | ICD-10-CM | POA: Diagnosis not present

## 2020-03-26 DIAGNOSIS — E785 Hyperlipidemia, unspecified: Secondary | ICD-10-CM | POA: Diagnosis not present

## 2020-03-26 DIAGNOSIS — S24103S Unspecified injury at T7-T10 level of thoracic spinal cord, sequela: Secondary | ICD-10-CM | POA: Diagnosis not present

## 2020-03-26 DIAGNOSIS — M6259 Muscle wasting and atrophy, not elsewhere classified, multiple sites: Secondary | ICD-10-CM | POA: Diagnosis not present

## 2020-03-26 DIAGNOSIS — I10 Essential (primary) hypertension: Secondary | ICD-10-CM | POA: Diagnosis not present

## 2020-03-26 DIAGNOSIS — G8220 Paraplegia, unspecified: Secondary | ICD-10-CM | POA: Diagnosis not present

## 2020-03-26 DIAGNOSIS — Z993 Dependence on wheelchair: Secondary | ICD-10-CM | POA: Diagnosis not present

## 2020-03-26 DIAGNOSIS — M48062 Spinal stenosis, lumbar region with neurogenic claudication: Secondary | ICD-10-CM | POA: Diagnosis not present

## 2020-03-26 DIAGNOSIS — Z86711 Personal history of pulmonary embolism: Secondary | ICD-10-CM | POA: Diagnosis not present

## 2020-03-26 DIAGNOSIS — E119 Type 2 diabetes mellitus without complications: Secondary | ICD-10-CM | POA: Diagnosis not present

## 2020-03-28 ENCOUNTER — Other Ambulatory Visit: Payer: Self-pay | Admitting: Nurse Practitioner

## 2020-03-28 DIAGNOSIS — M6259 Muscle wasting and atrophy, not elsewhere classified, multiple sites: Secondary | ICD-10-CM | POA: Diagnosis not present

## 2020-03-28 DIAGNOSIS — G35 Multiple sclerosis: Secondary | ICD-10-CM | POA: Diagnosis not present

## 2020-03-28 DIAGNOSIS — Z7982 Long term (current) use of aspirin: Secondary | ICD-10-CM | POA: Diagnosis not present

## 2020-03-28 DIAGNOSIS — Z993 Dependence on wheelchair: Secondary | ICD-10-CM | POA: Diagnosis not present

## 2020-03-28 DIAGNOSIS — G8220 Paraplegia, unspecified: Secondary | ICD-10-CM | POA: Diagnosis not present

## 2020-03-28 DIAGNOSIS — S24103S Unspecified injury at T7-T10 level of thoracic spinal cord, sequela: Secondary | ICD-10-CM | POA: Diagnosis not present

## 2020-03-28 DIAGNOSIS — M48062 Spinal stenosis, lumbar region with neurogenic claudication: Secondary | ICD-10-CM | POA: Diagnosis not present

## 2020-03-28 DIAGNOSIS — E119 Type 2 diabetes mellitus without complications: Secondary | ICD-10-CM | POA: Diagnosis not present

## 2020-03-28 DIAGNOSIS — E785 Hyperlipidemia, unspecified: Secondary | ICD-10-CM | POA: Diagnosis not present

## 2020-03-28 DIAGNOSIS — K219 Gastro-esophageal reflux disease without esophagitis: Secondary | ICD-10-CM

## 2020-03-28 DIAGNOSIS — Z7984 Long term (current) use of oral hypoglycemic drugs: Secondary | ICD-10-CM | POA: Diagnosis not present

## 2020-03-28 DIAGNOSIS — Z86711 Personal history of pulmonary embolism: Secondary | ICD-10-CM | POA: Diagnosis not present

## 2020-03-28 DIAGNOSIS — Z9181 History of falling: Secondary | ICD-10-CM | POA: Diagnosis not present

## 2020-03-28 DIAGNOSIS — I10 Essential (primary) hypertension: Secondary | ICD-10-CM | POA: Diagnosis not present

## 2020-03-28 DIAGNOSIS — R600 Localized edema: Secondary | ICD-10-CM | POA: Diagnosis not present

## 2020-04-02 ENCOUNTER — Encounter: Payer: Self-pay | Admitting: *Deleted

## 2020-04-02 ENCOUNTER — Other Ambulatory Visit: Payer: Self-pay | Admitting: *Deleted

## 2020-04-02 ENCOUNTER — Other Ambulatory Visit: Payer: Self-pay

## 2020-04-02 NOTE — Patient Outreach (Signed)
Aging Gracefully Program  RN Visit # 3  04/02/2020  Bernabe Dorce 1950/11/09 [id number]  Visit:   Aging Graecfully RN Visit # 3  Start Time:   14:05 End Time:   14:30 Total Minutes:   25 minutes  Readiness To Change Score:     Universal RN Interventions: Calendar Distribution: Yes (Reviewed with client today) Exercise Review: Yes (Reviewed with client today) Medications: Yes (Reviewed with client today; pill box provided/ client instructed in use) Medication Changes: Yes (None, per client today) Mood: Yes (Reviewed with client today) Pain: Yes (Reviewed with client today) PCP Advocacy/Support: Yes (Reviewed with client today) Fall Prevention: Yes (Reviewed with client today) Incontinence: Yes (Reviewed with client today)  Healthcare Provider Communication: Did Higher education careers adviser With [place] Provider?: No  Clinician View of Client Situation: Clinician View Of Client Situation: Grayland Ormond continues to make great progress in maintaining his independence and staying as strong as possible; he is very excited that he has been able to have home PT and believes this is a great help to him; he is visibly happy.  He has not had any recent falls and is looking forward to trying the pill box which was provided to him today.  He is looking forward to the home repairs being started and is definitiely on track in meeting his previously established Aging Gracefully RN goals around fall prevention.   Client's View of His/Her Situation: Client View Of His/Her Situation: "I am doing great!  The PT here at my home is really helping me-- I am able to do so many things that I haven't been able to before-- the PT even has me walking some with my walker!  They do a lot of the exercises that you showed me, it's really helping.  I have not had any recent falls, and don't plan to either.  The things the OT has helped me with are very helpful too- I hope they will be able to start soon on the home repair work,  but I am doing much better with the help of the PT."   OT Update: Will update Aging Gracefully team around successful completion of Aging Gracefully RN Visit # 3  Session Summary: Productive in-home visit with client who continues to make great progress in meeting his previously established Aging Gracefully goals around fall prevention.  Brainstorming was completed with client today and he is looking forward to having home repairs/ modifications being started.  Oneta Rack, RN, BSN, [employer] [facility] Care Management  [phone]

## 2020-04-02 NOTE — Patient Instructions (Signed)
It was great visiting with you this afternoon Larry [name]!  You have made amazing progress with staying active and I am really glad you are finding the in-home PT helpful!  You are making great progress in meeting your established Aging Gracefully goals around fall prevention-- keep up the great work!  I will call you in December to complete our last visit by phone; please listen out for me!  I hope you have a wonderful Thanksgiving and Christmas!   CLIENT/RN ACTION PLAN - FALL PREVENTION  Registered Nurse:  Reginia Naas RN Date: April 02, 2020  Client Name: Larry [name] Client ID: [id number]   Target Area:  FALL PREVENTION    Why Problem May Occur: Mobility issues; wheelchair use; medications   Target Goal: No Falls   STRATEGIES Coping Strategies Ideas  Change Positions Slowly: lying to sitting, sitting to standing . Changing positions can make people lightheaded. . When getting up in the morning sit for a few minutes, before getting in your wheelchair.  . Stand for a few minutes before walking and hold on to sturdy furniture or countertop.  Timmothy Sours' Rush for anything: take your time . For tasks you don't do every day, visualize yourself completing the task before you begin it; think about how you will accomplish the task and problems you may encounter  Drink water- stay hydrated . Dehydration can make people dizzy. . Ask your healthcare provider how much water you can drink. . Decrease caffeine, caffeine makes you urinate a lot, which can make you dehydrated. .   If you fall, tell someone- Great job in not falling!  Keep up the great work! . Tell a friend or family member even if you didn't get hurt. . Tell your healthcare provider if you fall.  They can help you figure out why. . Write down any falls you experience on your calendar along with the circumstances around the fall,so you can keep track of what happened to make you fall . Use your life alert system and keep your cell phone  nearby you at all times  Get your vision and hearing checked . Poor vision and hearing loss can make people fall. . Glaucoma and diabetes can cause poor vision. Doristine Devoid job getting your recent eye exam- make it a part of your annual routine to have an eye exam  Other .    Prevention Ideas  Review your Medicines-- try using the pill box the way we discussed today and see if you find it helpful . Many medicines can make you dizzy or sleepy and increase your risk of falling. . Your Aging Gracefully Nurse will look at your medications and see if you are taking any that might cause falling. . Your blood pressure medications and the medicine to help you sleep could cause you to feel light headed; if you start feeling this way, tell your doctor  Activity and Exercise-- keep up the great work with your physical therapy!  I am so glad that it is helping you stay strong and flexible . Aging Gracefully exercises- do the ones that you are able . Walking (inside or outside) . Dancing . Gardening . Housework:  cooking, Education administrator, Medical sales representative . Exercise while watching TV . Swimming or water aerobics. . Continue doing your personal exercise routine with your weights at home . Once gyms are open again, consider calling your insurance program to find our about the wellness benefit; maybe you and a friend could start going together  Strengthen  Bones . Exercise makes your bones stronger. . Vitamin D and Calcium make your bones stronger.  Ask your Healthcare provider if you should be taking them. . Your body makes vitamin D from the sun.  Sit in the sun for 10 minutes every day (without sunscreen). .   Control your urine  . Prevent constipation . Cut back on caffeinated drinks. . Don't wait to urinate. . If diabetic, control your blood sugar. . Ask your Aging Gracefully Nurse and Healthcare Provider  about urine control. . Try the Kegel exercises we talked about during our visit; these exercises strengthen the  muscles that start and stop your urine flow  Control your blood sugar (if you are diabetic) . High blood sugar can cause frequent urination, poor vision, and numbness in your feet, which can make you fall.   . Low blood sugar can cause confusion and dizziness. . Sit down and take your blood sugar if you experience any of these symptoms  Other coping strategies 1.Always wear good sturdy shoes with good soles  2.If your feet/ legs continue to have swelling, let your doctor know- I am glad that you have started wearing the compression stockings/ socks and glad the swelling has improved!  3. Talk to your doctor about options to receive physical therapy in your home-- you may want to look in to private duty physical therapy.  Your doctor may also have a physical therapist located at the office  4. Talk to your doctor about your sleep interruptions    PRACTICE It is important to practice the strategies so we can determine if they will be effective in helping to reach the goal.    Follow these specific recommendations:        If strategy does not work the first time, try it again.     We may make some changes over the next few sessions.      Oneta Rack, RN, BSN, [employer] [facility] Care Management  [phone]

## 2020-04-03 ENCOUNTER — Telehealth: Payer: Self-pay

## 2020-04-03 DIAGNOSIS — Z86711 Personal history of pulmonary embolism: Secondary | ICD-10-CM | POA: Diagnosis not present

## 2020-04-03 DIAGNOSIS — M48062 Spinal stenosis, lumbar region with neurogenic claudication: Secondary | ICD-10-CM | POA: Diagnosis not present

## 2020-04-03 DIAGNOSIS — Z993 Dependence on wheelchair: Secondary | ICD-10-CM | POA: Diagnosis not present

## 2020-04-03 DIAGNOSIS — Z9181 History of falling: Secondary | ICD-10-CM | POA: Diagnosis not present

## 2020-04-03 DIAGNOSIS — G35 Multiple sclerosis: Secondary | ICD-10-CM | POA: Diagnosis not present

## 2020-04-03 DIAGNOSIS — Z7982 Long term (current) use of aspirin: Secondary | ICD-10-CM | POA: Diagnosis not present

## 2020-04-03 DIAGNOSIS — I10 Essential (primary) hypertension: Secondary | ICD-10-CM | POA: Diagnosis not present

## 2020-04-03 DIAGNOSIS — E785 Hyperlipidemia, unspecified: Secondary | ICD-10-CM | POA: Diagnosis not present

## 2020-04-03 DIAGNOSIS — G8220 Paraplegia, unspecified: Secondary | ICD-10-CM | POA: Diagnosis not present

## 2020-04-03 DIAGNOSIS — S24103S Unspecified injury at T7-T10 level of thoracic spinal cord, sequela: Secondary | ICD-10-CM | POA: Diagnosis not present

## 2020-04-03 DIAGNOSIS — E119 Type 2 diabetes mellitus without complications: Secondary | ICD-10-CM | POA: Diagnosis not present

## 2020-04-03 DIAGNOSIS — M6259 Muscle wasting and atrophy, not elsewhere classified, multiple sites: Secondary | ICD-10-CM | POA: Diagnosis not present

## 2020-04-03 DIAGNOSIS — Z7984 Long term (current) use of oral hypoglycemic drugs: Secondary | ICD-10-CM | POA: Diagnosis not present

## 2020-04-03 DIAGNOSIS — R600 Localized edema: Secondary | ICD-10-CM | POA: Diagnosis not present

## 2020-04-03 NOTE — Progress Notes (Signed)
I have attempted without success to contact this patient by phone three times to do his hypertension Disease State call. I left a Voice message for patient to return my call.  [place] Pharmacist Assistant [phone]

## 2020-04-05 ENCOUNTER — Other Ambulatory Visit: Payer: Self-pay | Admitting: Nurse Practitioner

## 2020-04-05 DIAGNOSIS — N401 Enlarged prostate with lower urinary tract symptoms: Secondary | ICD-10-CM

## 2020-04-08 NOTE — Telephone Encounter (Signed)
Last OV 02/01/20 Last fill 03/27/19  #90/3

## 2020-04-09 ENCOUNTER — Ambulatory Visit (INDEPENDENT_AMBULATORY_CARE_PROVIDER_SITE_OTHER): Payer: Medicare Other

## 2020-04-09 VITALS — Ht 67.0 in | Wt 155.0 lb

## 2020-04-09 DIAGNOSIS — Z0000 Encounter for general adult medical examination without abnormal findings: Secondary | ICD-10-CM | POA: Diagnosis not present

## 2020-04-09 DIAGNOSIS — Z1211 Encounter for screening for malignant neoplasm of colon: Secondary | ICD-10-CM | POA: Diagnosis not present

## 2020-04-09 NOTE — Progress Notes (Signed)
Subjective:   Larry [name] is a 69 y.o. male who presents for an Initial Medicare Annual Wellness Visit.   I connected with Sohrab today by telephone and verified that I am speaking with the correct person using two identifiers. Location patient: home Location provider: work Persons participating in the virtual visit: patient, Marine scientist.    I discussed the limitations, risks, security and privacy concerns of performing an evaluation and management service by telephone and the availability of in person appointments. I also discussed with the patient that there may be a patient responsible charge related to this service. The patient expressed understanding and verbally consented to this telephonic visit.    Interactive audio and video telecommunications were attempted between this provider and patient, however failed, due to patient having technical difficulties OR patient did not have access to video capability.  We continued and completed visit with audio only.  Some vital signs may be absent or patient reported.   Time Spent with patient on telephone encounter: 20 minutes   Review of Systems     Cardiac Risk Factors include: male gender;advanced age (>50mn, >>66women);diabetes mellitus;dyslipidemia;hypertension     Objective:    Today's Vitals   04/09/20 1030  Weight: 155 lb (70.3 kg)  Height: '5\' 7"'  (1.702 m)   Body mass index is 24.28 kg/m.  Advanced Directives 04/09/2020 09/24/2019 01/06/2016 12/16/2015 01/04/2014 12/11/2011  Does Patient Have a Medical Advance Directive? No No No No Patient does not have advance directive;Patient would like information Patient would like information  Does patient want to make changes to medical advance directive? Yes (MAU/Ambulatory/Procedural Areas - Information given) - - - - -  Would patient like information on creating a medical advance directive? - No - Patient declined - No - patient declined information Advance directive brochure given  (Outpatient ONLY) Referral made to social work  Pre-existing out of facility DNR order (yellow form or pink MOST form) - - - - - No    Current Medications (verified) Outpatient Encounter Medications as of 04/09/2020  Medication Sig  . ACCU-CHEK AVIVA PLUS test strip TEST ONCE DAILY (IN THE  MORNING, FASTING)  . Accu-Chek FastClix Lancets MISC   . aspirin 81 MG tablet Take 81 mg by mouth daily.  . baclofen (LIORESAL) 20 MG tablet TAKE 1 TABLET BY MOUTH 3  TIMES DAILY  . Blood Glucose Calibration (ACCU-CHEK AVIVA) SOLN   . cloNIDine (CATAPRES) 0.1 MG tablet TAKE 1 TABLET BY MOUTH  TWICE DAILY  . ferrous sulfate 325 (65 FE) MG tablet Take 1 tablet (325 mg total) by mouth daily with breakfast.  . finasteride (PROSCAR) 5 MG tablet TAKE 1 TABLET BY MOUTH  DAILY  . furosemide (LASIX) 20 MG tablet Take 1 tablet (20 mg total) by mouth daily. In AM  . Incontinence Supply Disposable (DEPEND ADJUSTABLE UNDERWEAR LG) MISC Use as needed.  . Lancets Misc. (ACCU-CHEK FASTCLIX LANCET) KIT 1 Units by Does not apply route daily before breakfast.  . meloxicam (MOBIC) 15 MG tablet Take 1 tablet (15 mg total) by mouth daily as needed. for pain. Please request future refills from PCP.  . metFORMIN (GLUCOPHAGE) 850 MG tablet TAKE 1 TABLET BY MOUTH  TWICE DAILY WITH MEALS.  NEEDS OFFICE VISIT FOR  ADDITIONAL REFILLS  . mirabegron ER (MYRBETRIQ) 25 MG TB24 tablet Take 1 tablet (25 mg total) by mouth daily.  . Multiple Vitamin (MULTIVITAMIN) tablet Take 1 tablet by mouth daily.  . Olmesartan-amLODIPine-HCTZ 40-10-12.5 MG TABS TAKE 1 TABLET  BY MOUTH  DAILY  . potassium chloride SA (KLOR-CON) 20 MEQ tablet Take 1 tablet (20 mEq total) by mouth daily.  . pregabalin (LYRICA) 75 MG capsule Take 1 capsule (75 mg total) by mouth 2 (two) times daily.  . traZODone (DESYREL) 50 MG tablet Take 1 tablet (50 mg total) by mouth at bedtime.   No facility-administered encounter medications on file as of 04/09/2020.    Allergies  (verified) No known allergies   History: Past Medical History:  Diagnosis Date  . Abnormality of gait   . Acute delirium 12/11/2011  . CTS (carpal tunnel syndrome) 09/26/2015  . Diabetes mellitus   . Hypercholesterolemia   . Hypertension   . Kidney stones   . Leukocytopenia 05/22/2013  . MS (multiple sclerosis) [facility])    sees [facility]  . 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 BACK SURGERY    . COLONOSCOPY    . LUMBAR LAMINECTOMY     Family History  Problem Relation Age of Onset  . Hypertension Mother   . Liver cancer Father   . Hypertension Sister    Social History   Socioeconomic History  . Marital status: Divorced    Spouse name: Not on file  . Number of children: 1  . Years of education: 24  . Highest education level: Not on file  Occupational History  . Occupation: Disability  Tobacco Use  . Smoking status: Never Smoker  . Smokeless tobacco: Never Used  Vaping Use  . Vaping Use: Never used  Substance and Sexual Activity  . Alcohol use: No  . Drug use: No  . Sexual activity: Not Currently  Other Topics Concern  . Not on file  Social History Narrative   Work or [employer] alone, feels safe      Spiritual Beliefs: Christian      Lifestyle: works out with friend 3 days per week at [employer]; diet is poor - likes to [employer] but like unhealthy options      Right-handed.    2 cups caffeine per day.   Social Determinants of Health   Financial Resource Strain: Low Risk   . Difficulty of Paying Living Expenses: Not hard at all  Food Insecurity: No Food Insecurity  . Worried About Charity fundraiser in the Last Year: Never true  . Ran Out of Food in the Last Year: Never true  Transportation Needs: No Transportation Needs  . Lack of Transportation (Medical): No  . Lack of Transportation (Non-Medical): No  Physical  Activity: Insufficiently Active  . Days of Exercise per Week: 4 days  . Minutes of Exercise per Session: 30 min  Stress: No Stress Concern Present  . Feeling of Stress : Not at all  Social Connections: Moderately Isolated  . Frequency of Communication with Friends and Family: More than three times a week  . Frequency of Social Gatherings with Friends and Family: Once a week  . Attends Religious Services: More than 4 times per year  . Active Member of Clubs or Organizations: No  . Attends Archivist Meetings: Never  . Marital Status: Divorced    Tobacco Counseling Counseling given: Not Answered   Clinical Intake:  Pre-visit preparation completed: Yes  Pain : No/denies pain     Nutritional Status: BMI of 19-24  Normal Nutritional Risks: None Diabetes:  Yes CBG done?: No Did pt. bring in CBG monitor from home?: No (phone visit)  How often do you need to have someone help you when you read instructions, pamphlets, or other written materials from your doctor or pharmacy?: 1 - Never What is the last grade level you completed in school?: 1 yr of college  Diabetes:  Is the patient diabetic?  Yes  If diabetic, was a CBG obtained today?  No  Did the patient bring in their glucometer from home?  No phone visit How often do you monitor your CBG's? 2-3 times per month.   Financial Strains and Diabetes Management:  Are you having any financial strains with the device, your supplies or your medication? No .  Does the patient want to be seen by Chronic Care Management for management of their diabetes?  No  Would the patient like to be referred to a Nutritionist or for Diabetic Management?  No   Diabetic Exams:  Diabetic Eye Exam: Completed 12/11/2019 .   Diabetic Foot Exam:  Pt has been advised about the importance in completing this exam. To be completed by PCP     Interpreter Needed?: No  Information entered by :: Caroleen Hamman LPN   Activities of Daily  Living In your present state of health, do you have any difficulty performing the following activities: 04/09/2020 09/24/2019  Hearing? N N  Vision? N N  Difficulty concentrating or making decisions? N N  Walking or climbing stairs? N Y  Dressing or bathing? N N  Doing errands, shopping? N Y  Conservation officer, nature and eating ? N -  Using the Toilet? N -  In the past six months, have you accidently leaked urine? N -  Do you have problems with loss of bowel control? N -  Managing your Medications? N -  Managing your Finances? N -  Housekeeping or managing your Housekeeping? N -  Some recent data might be hidden    Patient Care Team: Nche, Charlene Brooke, NP as PCP - General (Internal Medicine) Kristeen Miss, MD as Consulting Physician (Neurosurgery) Kristeen Miss, MD as Consulting Physician (Neurosurgery) Germaine Pomfret, [facility] as Pharmacist (Pharmacist) Knox Royalty, RN as [place] any recent [place] you may have received from other than [facility] providers in the past year (date may be approximate).     Assessment:   This is a routine wellness examination for Larry [name].  Hearing/Vision screen  Hearing Screening   '125Hz'  '250Hz'  '500Hz'  '1000Hz'  '2000Hz'  '3000Hz'  '4000Hz'  '6000Hz'  '8000Hz'   Right ear:           Left ear:           Comments: No issues  Vision Screening Comments: Wears glasses  Last eye exam-11/2019-[facility]  Dietary issues and exercise activities discussed: Current Exercise Habits: Home exercise routine, Type of exercise: strength training/weights, Time (Minutes): 30, Frequency (Times/Week): 4, Weekly Exercise (Minutes/Week): 120, Intensity: Mild, Exercise limited by: orthopedic condition(s) (pt in a wheelchair)  Goals      Patient Stated   .  "I want to stay as independent as I can despite living alone and being tied down to my wheelchair" (pt-stated)      CLIENT/RN [place]  Registered Nurse:  Reginia Naas RN Date: April 02, 2020  Client Name: Brantley Wiley Client ID: [id number]   Target Area:  FALL PREVENTION    Why Problem May Occur: Mobility issues; wheelchair use; medications  Target Goal: No Falls   STRATEGIES Coping Strategies Ideas  Change Positions Slowly: lying to sitting, sitting to standing . Changing positions can make people lightheaded. . When getting up in the morning sit for a few minutes, before getting in your wheelchair.  . Stand for a few minutes before walking and hold on to sturdy furniture or countertop.  Timmothy Sours' [place] for anything: take your time . For tasks you don't do every day, visualize yourself completing the task before you begin it; think about how you will accomplish the task and problems you may encounter  Drink water- stay hydrated . Dehydration can make people dizzy. . Ask your healthcare provider how much water you can drink. . Decrease caffeine, caffeine makes you urinate a lot, which can make you dehydrated. .   If you fall, tell someone- Great job in not falling!  Keep up the great work! . Tell a friend or family member even if you didn't get hurt. . Tell your healthcare provider if you fall.  They can help you figure out why. . Write down any falls you experience on your calendar along with the circumstances around the fall,so you can keep track of what happened to make you fall . Use your life alert system and keep your cell phone nearby you at all times  Get your vision and hearing checked . Poor vision and hearing loss can make people fall. . Glaucoma and diabetes can cause poor vision. Doristine Devoid job getting your recent eye exam- make it a part of your annual routine to have an eye exam  Other .    Prevention Ideas  Review your Medicines-- try using the pill box the way we discussed today and see if you find it helpful . Many medicines can make you dizzy or sleepy and increase your risk of falling. . Your Aging Gracefully Nurse will look  at your medications and see if you are taking any that might cause falling. . Your blood pressure medications and the medicine to help you sleep could cause you to feel light headed; if you start feeling this way, tell your doctor  Activity and Exercise-- keep up the great work with your physical therapy!  I am so glad that it is helping you stay strong and flexible . Aging Gracefully exercises- do the ones that you are able . Walking (inside or outside) . Dancing . Gardening . Housework:  cooking, Education administrator, Medical sales representative . Exercise while watching TV . Swimming or water aerobics. . Continue doing your personal exercise routine with your weights at home . Once gyms are open again, consider calling your insurance program to find our about the wellness benefit; maybe you and a friend could start going together  Strengthen Bones . Exercise makes your bones stronger. . Vitamin D and Calcium make your bones stronger.  Ask your Healthcare provider if you should be taking them. . Your body makes vitamin D from the sun.  Sit in the sun for 10 minutes every day (without sunscreen). .   Control your urine  . Prevent constipation . Cut back on caffeinated drinks. . Don't wait to urinate. . If diabetic, control your blood sugar. . Ask your Aging Gracefully Nurse and Healthcare Provider  about urine control. . Try the Kegel exercises we talked about during our visit; these exercises strengthen the muscles that start and stop your urine flow  Control your blood sugar (if you are diabetic) . High blood sugar can cause frequent  urination, poor vision, and numbness in your feet, which can make you fall.   . Low blood sugar can cause confusion and dizziness. . Sit down and take your blood sugar if you experience any of these symptoms  Other coping strategies 1.Always wear good sturdy shoes with good soles  2.If your feet/ legs continue to have swelling, let your doctor know- I am glad that you have started wearing  the compression stockings/ socks and glad the swelling has improved!  3. Talk to your doctor about options to receive physical therapy in your home-- you may want to look in to private duty physical therapy.  Your doctor may also have a physical therapist located at the office  4. Talk to your doctor about your sleep interruptions    PRACTICE It is important to practice the strategies so we can determine if they will be effective in helping to reach the goal.    Follow these specific recommendations:        If strategy does not work the first time, try it again.     We may make some changes over the next few sessions.      Oneta Rack, RN, BSN, [place] [facility]  [phone]        Other   .  Chronic Care Management      CARE PLAN ENTRY (see longitudinal plan of care for additional care plan information)  Current Barriers:  . Chronic Disease Management support, education, and care coordination needs related to Hypertension, Hyperlipidemia, Diabetes, Overactive Bladder, and Peripheral Artery Disease   Hypertension BP Readings from Last 3 Encounters:  11/14/19 128/60  11/01/19 118/60  10/04/19 101/70   . Pharmacist Clinical Goal(s): o Over the next 90 days, patient will work with PharmD and providers to maintain BP goal <130/80 . Current regimen:  o Olmesartan-Amlodipine-HCTZ 40-10-12.5 mg daily  o Clonidine 0.1 mg BID . Interventions:  o Recommend tapering clonidine  o Recommend increasing Olmesartan-Amlodipine-HCTZ . Patient self care activities - Over the next 30 days, patient will: o Check blood pressure daily, document, and provide at future appointments o Ensure daily salt intake < 2300 mg/day  Hyperlipidemia Lab Results  Component Value Date/Time   LDLCALC 113 (H) 11/01/2019 11:45 AM   . Pharmacist Clinical Goal(s): o Over the next 90 days, patient will work with PharmD and providers to achieve LDL  goal < 70 . Current regimen:  o None . Interventions: o Recommend resuming rosuvastatin 5 mg daily to lower your chances of heart attack/stroke . Patient self care activities - Over the next 90 days, patient will: o Limit red meat, processed meat, and greasy or fatty foods. o Eat more vegetables and fruit  Diabetes Lab Results  Component Value Date/Time   HGBA1C 6.1 11/01/2019 11:43 AM   HGBA1C 5.9 03/24/2019 03:55 PM   . Pharmacist Clinical Goal(s): o Over the next 90 days, patient will work with PharmD and providers to achieve A1c goal <7% . Current regimen:  o Metformin 850 mg twice daily  o Pioglitazone 15 mg daily  . Patient self care activities - Over the next 90 days, patient will: o Check blood sugar once daily, document, and provide at future appointments o Contact provider with any episodes of hypoglycemia  Overactive Bladder . Pharmacist Clinical Goal(s) o Over the next 90 days, patient will work with PharmD and providers to provide relief of urinary symptoms . Current regimen:  o Finasteride 5  mg daily             o Myrbetriq 25 mg daily . Interventions: o Recommend increasing Myrbetriq to 50 mg daily at next appointment with [facility].  Medication management . Pharmacist Clinical Goal(s): o Over the next 90 days, patient will work with PharmD and providers to maintain optimal medication adherence . Current pharmacy: [facility] . Interventions o Comprehensive medication review performed. o Continue current medication management strategy . Patient self care activities - Over the next 90 days, patient will: o Take medications as prescribed o Report any questions or concerns to PharmD and/or provider(s)    .  Patient Stated      Ease and safety with showering (including two grab bars in shower (one on long wall partially beside tub bench spanning up towards front wall of shower and one at front wall of shower; as well as widening hallway door to  bathroom.    .  Patient Stated      Ease of dryer use (needs new knobs)      Depression Screen PHQ 2/9 Scores 04/09/2020 01/18/2020 12/28/2019 09/14/2019 03/24/2019 06/10/2018 08/05/2017  PHQ - 2 Score 0 0 0 0 0 0 0    Fall Risk Fall Risk  04/09/2020 01/09/2020 09/14/2019 03/24/2019 06/10/2018  Falls in the past year? 0 1 0 0 0  Number falls in past yr: 0 0 0 - -  Injury with Fall? 0 0 0 - -  Risk for fall due to : Impaired mobility History of fall(s);Impaired mobility;Impaired balance/gait - - -  Risk for fall due to: Comment - - - - -  Follow up Falls prevention discussed Falls evaluation completed - - -    Any stairs in or around the home? No  Home free of loose throw rugs in walkways, pet beds, electrical cords, etc? Yes  Adequate lighting in your home to reduce risk of falls? Yes   ASSISTIVE DEVICES UTILIZED TO PREVENT FALLS:  Life alert? No  Use of a cane, walker or w/c? Yes  Wheelchair Grab bars in the bathroom? Yes  Shower chair or bench in shower? Yes  Elevated toilet seat or a handicapped toilet? No   TIMED UP AND GO:  Was the test performed? No . Phone visit   Cognitive Function:No cognitive impairment noted.        Immunizations Immunization History  Administered Date(s) Administered  . Fluad Quad(high Dose 65+) 03/24/2019  . Influenza Split 02/05/2012  . Influenza, High Dose Seasonal PF 03/05/2014, 05/17/2015, 02/15/2017, 06/10/2018  . Influenza,inj,Quad PF,6+ Mos 05/09/2013  . PFIZER SARS-COV-2 Vaccination 01/14/2020, 02/04/2020  . PPD Test 01/05/2016  . Pneumococcal Conjugate-13 03/05/2014  . Pneumococcal Polysaccharide-23 05/17/2015    TDAP status: Due, Education has been provided regarding the importance of this vaccine. Advised may receive this vaccine at local pharmacy or Health Dept. Aware to provide a copy of the vaccination record if obtained from local pharmacy or Health Dept. Verbalized acceptance and understanding.   Flu vaccine status:  Due-Patient plans to obtain vaccine soon.  Pneumococcal vaccine status: Up to date   Covid-19 vaccine status: Completed vaccines  Qualifies for Shingles Vaccine? Yes   Zostavax completed No   Shingrix Completed?: No.    Education has been provided regarding the importance of this vaccine. Patient has been advised to call insurance company to determine out of pocket expense if they have not yet received this vaccine. Advised may also receive vaccine at local pharmacy or Health  Dept. Verbalized acceptance and understanding.  Screening Tests Health Maintenance  Topic Date Due  . FOOT EXAM  06/11/2019  . INFLUENZA VACCINE  12/31/2019  . TETANUS/TDAP  09/23/2020 (Originally 08/08/1969)  . COLONOSCOPY  05/16/2025 (Originally 06/24/2013)  . HEMOGLOBIN A1C  05/02/2020  . OPHTHALMOLOGY EXAM  12/10/2020  . COVID-19 Vaccine  Completed  . Hepatitis C Screening  Completed  . PNA vac Low Risk Adult  Completed    Health Maintenance  Health Maintenance Due  Topic Date Due  . FOOT EXAM  06/11/2019  . INFLUENZA VACCINE  12/31/2019    Colorectal Cancer Screening: Cologuard ordered today  Lung Cancer Screening: (Low Dose CT Chest recommended if Age 56-80 years, 30 pack-year currently smoking OR have quit w/in 15years.) does not qualify.     Additional Screening:  Hepatitis C Screening:Completed 05/22/2013  Vision Screening: Recommended annual ophthalmology exams for early detection of glaucoma and other disorders of the eye. Is the patient up to date with their annual eye exam?  Yes  Who is the provider or what is the name of the office in which the patient attends annual eye exams? [facility]   Dental Screening: Recommended annual dental exams for proper oral hygiene  Community Resource Referral / Chronic Care Management: CRR required this visit?  No   CCM required this visit?  No      Plan:     I have personally reviewed and noted the following in the patient's chart:    . Medical and social history . Use of alcohol, tobacco or illicit drugs  . Current medications and supplements . Functional ability and status . Nutritional status . Physical activity . Advanced directives . List of other physicians . Hospitalizations, surgeries, and ER visits in previous 12 months . Vitals . Screenings to include cognitive, depression, and falls . Referrals and appointments  In addition, I have reviewed and discussed with patient certain preventive protocols, quality metrics, and best practice recommendations. A written personalized care plan for preventive services as well as general preventive health recommendations were provided to patient.   Due to this being a telephonic visit, the after visit summary with patients personalized plan was offered to patient via mail or my-chart. Patient would like to access on my-chart.   Marta Antu, LPN   56/12/1273  Nurse Health Advisor  Nurse Notes: None

## 2020-04-09 NOTE — Patient Instructions (Signed)
[name] , Thank you for taking time to complete your Medicare Wellness Visit. I appreciate your ongoing commitment to your health goals. Please review the following plan we discussed and let me know if I can assist you in the future.   Screening recommendations/referrals: Colonoscopy: Cologuard ordered today. You will receive a kit with instructions in the mail. Recommended yearly ophthalmology/optometry visit for glaucoma screening and checkup Recommended yearly dental visit for hygiene and checkup  Vaccinations: Influenza vaccine: Due- May obtain vaccine at our office or your local pharmacy. Pneumococcal vaccine: Completed vaccines Tdap vaccine: Discuss with pharmacy  Covid-19: Completed vaccines  Advanced directives: Information mailed today  Conditions/risks identified: See problem list  Next appointment: Follow up in one year for your annual wellness visit. 04/15/21 @ 10:30am  Preventive Care 71 Years and Older, Male Preventive care refers to lifestyle choices and visits with your health care provider that can promote health and wellness. What does preventive care include?  A yearly physical exam. This is also called an annual well check.  Dental exams once or twice a year.  Routine eye exams. Ask your health care provider how often you should have your eyes checked.  Personal lifestyle choices, including:  Daily care of your teeth and gums.  Regular physical activity.  Eating a healthy diet.  Avoiding tobacco and drug use.  Limiting alcohol use.  Practicing safe sex.  Taking low doses of aspirin every day.  Taking vitamin and mineral supplements as recommended by your health care provider. What happens during an annual well check? The services and screenings done by your health care provider during your annual well check will depend on your age, overall health, lifestyle risk factors, and family history of disease. Counseling  Your health care provider may  ask you questions about your:  Alcohol use.  Tobacco use.  Drug use.  Emotional well-being.  Home and relationship well-being.  Sexual activity.  Eating habits.  History of falls.  Memory and ability to understand (cognition).  Work and work Statistician. Screening  You may have the following tests or measurements:  Height, weight, and BMI.  Blood pressure.  Lipid and cholesterol levels. These may be checked every 5 years, or more frequently if you are over 65 years old.  Skin check.  Lung cancer screening. You may have this screening every year starting at age 26 if you have a 30-pack-year history of smoking and currently smoke or have quit within the past 15 years.  Fecal occult blood test (FOBT) of the stool. You may have this test every year starting at age 31.  Flexible sigmoidoscopy or colonoscopy. You may have a sigmoidoscopy every 5 years or a colonoscopy every 10 years starting at age 37.  Prostate cancer screening. Recommendations will vary depending on your family history and other risks.  Hepatitis C blood test.  Hepatitis B blood test.  Sexually transmitted disease (STD) testing.  Diabetes screening. This is done by checking your blood sugar (glucose) after you have not eaten for a while (fasting). You may have this done every 1-3 years.  Abdominal aortic aneurysm (AAA) screening. You may need this if you are a current or former smoker.  Osteoporosis. You may be screened starting at age 82 if you are at high risk. Talk with your health care provider about your test results, treatment options, and if necessary, the need for more tests. Vaccines  Your health care provider may recommend certain vaccines, such as:  Influenza vaccine. This is  recommended every year.  Tetanus, diphtheria, and acellular pertussis (Tdap, Td) vaccine. You may need a Td booster every 10 years.  Zoster vaccine. You may need this after age 55.  Pneumococcal 13-valent  conjugate (PCV13) vaccine. One dose is recommended after age 58.  Pneumococcal polysaccharide (PPSV23) vaccine. One dose is recommended after age 59. Talk to your health care provider about which screenings and vaccines you need and how often you need them. This information is not intended to replace advice given to you by your health care provider. Make sure you discuss any questions you have with your health care provider. Document Released: 06/14/2015 Document Revised: 02/05/2016 Document Reviewed: 03/19/2015 Elsevier Interactive Patient Education  2017 [place] Prevention in the Home Falls can cause injuries. They can happen to people of all ages. There are many things you can do to make your home safe and to help prevent falls. What can I do on the outside of my home?  Regularly fix the edges of walkways and driveways and fix any cracks.  Remove anything that might make you trip as you walk through a door, such as a raised step or threshold.  Trim any bushes or trees on the path to your home.  Use bright outdoor lighting.  Clear any walking paths of anything that might make someone trip, such as rocks or tools.  Regularly check to see if handrails are loose or broken. Make sure that both sides of any steps have handrails.  Any raised decks and porches should have guardrails on the edges.  Have any leaves, snow, or ice cleared regularly.  Use sand or salt on walking paths during winter.  Clean up any spills in your garage right away. This includes oil or grease spills. What can I do in the bathroom?  Use night lights.  Install grab bars by the toilet and in the tub and shower. Do not use towel bars as grab bars.  Use non-skid mats or decals in the tub or shower.  If you need to sit down in the shower, use a plastic, non-slip stool.  Keep the floor dry. Clean up any water that spills on the floor as soon as it happens.  Remove soap buildup in the tub or  shower regularly.  Attach bath mats securely with double-sided non-slip rug tape.  Do not have throw rugs and other things on the floor that can make you trip. What can I do in the bedroom?  Use night lights.  Make sure that you have a light by your bed that is easy to reach.  Do not use any sheets or blankets that are too big for your bed. They should not hang down onto the floor.  Have a firm chair that has side arms. You can use this for support while you get dressed.  Do not have throw rugs and other things on the floor that can make you trip. What can I do in the kitchen?  Clean up any spills right away.  Avoid walking on wet floors.  Keep items that you use a lot in easy-to-reach places.  If you need to reach something above you, use a strong step stool that has a grab bar.  Keep electrical cords out of the way.  Do not use floor polish or wax that makes floors slippery. If you must use wax, use non-skid floor wax.  Do not have throw rugs and other things on the floor that can make you trip.  What can I do with my stairs?  Do not leave any items on the stairs.  Make sure that there are handrails on both sides of the stairs and use them. Fix handrails that are broken or loose. Make sure that handrails are as long as the stairways.  Check any carpeting to make sure that it is firmly attached to the stairs. Fix any carpet that is loose or worn.  Avoid having throw rugs at the top or bottom of the stairs. If you do have throw rugs, attach them to the floor with carpet tape.  Make sure that you have a light switch at the top of the stairs and the bottom of the stairs. If you do not have them, ask someone to add them for you. What else can I do to help prevent falls?  Wear shoes that:  Do not have high heels.  Have rubber bottoms.  Are comfortable and fit you well.  Are closed at the toe. Do not wear sandals.  If you use a stepladder:  Make sure that it is fully  opened. Do not climb a closed stepladder.  Make sure that both sides of the stepladder are locked into place.  Ask someone to hold it for you, if possible.  Clearly mark and make sure that you can see:  Any grab bars or handrails.  First and last steps.  Where the edge of each step is.  Use tools that help you move around (mobility aids) if they are needed. These include:  Canes.  Walkers.  Scooters.  Crutches.  Turn on the lights when you go into a dark area. Replace any light bulbs as soon as they burn out.  Set up your furniture so you have a clear path. Avoid moving your furniture around.  If any of your floors are uneven, fix them.  If there are any pets around you, be aware of where they are.  Review your medicines with your doctor. Some medicines can make you feel dizzy. This can increase your chance of falling. Ask your doctor what other things that you can do to help prevent falls. This information is not intended to replace advice given to you by your health care provider. Make sure you discuss any questions you have with your health care provider. Document Released: 03/14/2009 Document Revised: 10/24/2015 Document Reviewed: 06/22/2014 Elsevier Interactive Patient Education  2017 [employer].

## 2020-04-10 DIAGNOSIS — Z7984 Long term (current) use of oral hypoglycemic drugs: Secondary | ICD-10-CM | POA: Diagnosis not present

## 2020-04-10 DIAGNOSIS — G8220 Paraplegia, unspecified: Secondary | ICD-10-CM | POA: Diagnosis not present

## 2020-04-10 DIAGNOSIS — Z993 Dependence on wheelchair: Secondary | ICD-10-CM | POA: Diagnosis not present

## 2020-04-10 DIAGNOSIS — R600 Localized edema: Secondary | ICD-10-CM | POA: Diagnosis not present

## 2020-04-10 DIAGNOSIS — G35 Multiple sclerosis: Secondary | ICD-10-CM | POA: Diagnosis not present

## 2020-04-10 DIAGNOSIS — M6259 Muscle wasting and atrophy, not elsewhere classified, multiple sites: Secondary | ICD-10-CM | POA: Diagnosis not present

## 2020-04-10 DIAGNOSIS — I10 Essential (primary) hypertension: Secondary | ICD-10-CM | POA: Diagnosis not present

## 2020-04-10 DIAGNOSIS — E785 Hyperlipidemia, unspecified: Secondary | ICD-10-CM | POA: Diagnosis not present

## 2020-04-10 DIAGNOSIS — Z7982 Long term (current) use of aspirin: Secondary | ICD-10-CM | POA: Diagnosis not present

## 2020-04-10 DIAGNOSIS — Z86711 Personal history of pulmonary embolism: Secondary | ICD-10-CM | POA: Diagnosis not present

## 2020-04-10 DIAGNOSIS — M48062 Spinal stenosis, lumbar region with neurogenic claudication: Secondary | ICD-10-CM | POA: Diagnosis not present

## 2020-04-10 DIAGNOSIS — Z9181 History of falling: Secondary | ICD-10-CM | POA: Diagnosis not present

## 2020-04-10 DIAGNOSIS — E119 Type 2 diabetes mellitus without complications: Secondary | ICD-10-CM | POA: Diagnosis not present

## 2020-04-10 DIAGNOSIS — S24103S Unspecified injury at T7-T10 level of thoracic spinal cord, sequela: Secondary | ICD-10-CM | POA: Diagnosis not present

## 2020-04-18 ENCOUNTER — Telehealth: Payer: Self-pay

## 2020-04-18 NOTE — Progress Notes (Signed)
Chronic Care Management Pharmacy Assistant   Name: Larry [name]  MRN: [id number] DOB: [birth year]  Reason for Encounter:Hypertension  Disease State Call.    PCP : Flossie Buffy, NP  Allergies:   Allergies  Allergen Reactions  . No Known Allergies     Medications: Outpatient Encounter Medications as of 04/18/2020  Medication Sig  . ACCU-CHEK AVIVA PLUS test strip TEST ONCE DAILY (IN THE  MORNING, FASTING)  . Accu-Chek FastClix Lancets MISC   . aspirin 81 MG tablet Take 81 mg by mouth daily.  . baclofen (LIORESAL) 20 MG tablet TAKE 1 TABLET BY MOUTH 3  TIMES DAILY  . Blood Glucose Calibration (ACCU-CHEK AVIVA) SOLN   . cloNIDine (CATAPRES) 0.1 MG tablet TAKE 1 TABLET BY MOUTH  TWICE DAILY  . ferrous sulfate 325 (65 FE) MG tablet Take 1 tablet (325 mg total) by mouth daily with breakfast.  . finasteride (PROSCAR) 5 MG tablet TAKE 1 TABLET BY MOUTH  DAILY  . furosemide (LASIX) 20 MG tablet Take 1 tablet (20 mg total) by mouth daily. In AM  . Incontinence Supply Disposable (DEPEND ADJUSTABLE UNDERWEAR LG) MISC Use as needed.  . Lancets Misc. (ACCU-CHEK FASTCLIX LANCET) KIT 1 Units by Does not apply route daily before breakfast.  . meloxicam (MOBIC) 15 MG tablet Take 1 tablet (15 mg total) by mouth daily as needed. for pain. Please request future refills from PCP.  . metFORMIN (GLUCOPHAGE) 850 MG tablet TAKE 1 TABLET BY MOUTH  TWICE DAILY WITH MEALS.  NEEDS OFFICE VISIT FOR  ADDITIONAL REFILLS  . mirabegron ER (MYRBETRIQ) 25 MG TB24 tablet Take 1 tablet (25 mg total) by mouth daily.  . Multiple Vitamin (MULTIVITAMIN) tablet Take 1 tablet by mouth daily.  . Olmesartan-amLODIPine-HCTZ 40-10-12.5 MG TABS TAKE 1 TABLET BY MOUTH  DAILY  . potassium chloride SA (KLOR-CON) 20 MEQ tablet Take 1 tablet (20 mEq total) by mouth daily.  . pregabalin (LYRICA) 75 MG capsule Take 1 capsule (75 mg total) by mouth 2 (two) times daily.  . traZODone (DESYREL) 50 MG tablet Take 1 tablet (50 mg  total) by mouth at bedtime.   No facility-administered encounter medications on file as of 04/18/2020.    Current Diagnosis: Patient Active Problem List   Diagnosis Date Noted  . Bilateral leg edema 02/09/2020  . Elevated LDL cholesterol level 11/09/2019  . Multiple subsegmental pulmonary emboli without acute cor pulmonale ([place]) 11/01/2019  . Pressure injury of skin 09/25/2019  . Acute respiratory failure due to COVID-19 ([place]) 09/23/2019  . Benign prostatic hyperplasia with lower urinary tract symptoms 09/14/2019  . Cervical stenosis of spinal canal 05/09/2019  . Thoracic spinal stenosis 05/09/2019  . Gait abnormality 08/08/2018  . Spinal stenosis 08/08/2018  . Iron deficiency anemia 02/09/2018  . Bowel incontinence 01/01/2017  . Vitamin D deficiency 01/11/2016  . GERD (gastroesophageal reflux disease) 01/11/2016  . Spinal stenosis, lumbar region, with neurogenic claudication 01/10/2016  . Spinal stenosis of lumbar region with radiculopathy 01/10/2016  . Elective surgery   . Post-operative pain   . Tachycardia   . Peripheral neuropathy   . Spondylolisthesis of lumbar region 12/30/2015  . CTS (carpal tunnel syndrome) 09/26/2015  . Lumbar stenosis 09/26/2015  . Weakness 09/05/2015  . Spinal stenosis in cervical region 06/12/2015  . Leukocytopenia 05/22/2013  . History of spinal cord injury 12/12/2011  . Paraplegia ([place]) 12/11/2011  . Altered mental status 12/11/2011  . DM (diabetes mellitus) ([place]) 12/11/2011  . Overactive bladder 12/11/2011  .  MS (multiple sclerosis) ([place])   . Hypertension     Follow-Up:  Pharmacist Review   Reviewed chart prior to disease state call. Spoke with patient regarding BP  Recent Office Vitals: BP Readings from Last 3 Encounters:  02/01/20 122/60  11/14/19 128/60  11/01/19 118/60   Pulse Readings from Last 3 Encounters:  02/01/20 80  11/14/19 63  11/01/19 94    Wt Readings from Last 3 Encounters:  04/09/20 155 lb (70.3 kg)    02/01/20 155 lb (70.3 kg)  11/14/19 154 lb (69.9 kg)     Kidney Function Lab Results  Component Value Date/Time   CREATININE 0.86 11/01/2019 11:43 AM   CREATININE 0.88 09/30/2019 02:24 AM   CREATININE 1.0 01/04/2014 09:43 AM   CREATININE 0.9 05/22/2013 11:15 AM   GFR 87.61 11/01/2019 11:43 AM   GFRNONAA >60 09/30/2019 02:24 AM   GFRNONAA >89 12/25/2011 11:32 AM   GFRAA >60 09/30/2019 02:24 AM   GFRAA >89 12/25/2011 11:32 AM    BMP Latest Ref Rng & Units 11/01/2019 09/30/2019 09/28/2019  Glucose 70 - 99 mg/dL 117(H) 144(H) 182(H)  BUN 6 - 23 mg/dL 15 32(H) 37(H)  Creatinine 0.40 - 1.50 mg/dL 0.86 0.88 1.04  Sodium 135 - 145 mEq/L 140 138 138  Potassium 3.5 - 5.1 mEq/L 4.1 4.6 4.7  Chloride 96 - 112 mEq/L 103 103 105  CO2 19 - 32 mEq/L _0 Calcium 8.4 - 10.5 mg/dL 9.6 8.9 8.6(L)    . Current antihypertensive regimen:  ? Olmesartan-Amlodipine-HCTZ 40-10-12.5 mg daily  ? Clonidine 0.1 mg BID . How often are you checking your Blood Pressure? several times per month . Current home BP readings:  o On 04/18/20 it was 95/58. o On 03/27/20 it was 97/61. o On 03/13/20 it was 137/56 o On 03/04/20 it was 98/66. Patient denies lightheadedness, dizziness and headaches. . What recent interventions/DTPs have been made by any provider to improve Blood Pressure control since last CPP Visit: None ID . Any recent hospitalizations or ED visits since last visit with CPP? No . What diet changes have been made to improve Blood Pressure Control?  o Patient states he "eats everything that is not good". . Fish, potatoes, cabbage, mac n cheese, and oatmeal.  . Patient states he use cajun seasoning when he cooks. . What exercise is being done to improve your Blood Pressure Control?  o Patient states he does not exercise because he is in a wheelchair.   Adherence Review: Is the patient currently on ACE/ARB medication? Yes Does the patient have >5 day gap between last estimated fill dates? Yes    [place] Pharmacist Assistant [phone]     Maryjean Ka

## 2020-04-30 DIAGNOSIS — R338 Other retention of urine: Secondary | ICD-10-CM | POA: Diagnosis not present

## 2020-05-01 ENCOUNTER — Other Ambulatory Visit: Payer: Self-pay

## 2020-05-02 ENCOUNTER — Encounter: Payer: Self-pay | Admitting: Nurse Practitioner

## 2020-05-02 ENCOUNTER — Ambulatory Visit (INDEPENDENT_AMBULATORY_CARE_PROVIDER_SITE_OTHER): Payer: Medicare Other | Admitting: Nurse Practitioner

## 2020-05-02 VITALS — BP 126/72 | HR 78 | Temp 97.5°F | Ht 67.0 in | Wt 155.0 lb

## 2020-05-02 DIAGNOSIS — N3281 Overactive bladder: Secondary | ICD-10-CM

## 2020-05-02 DIAGNOSIS — G629 Polyneuropathy, unspecified: Secondary | ICD-10-CM

## 2020-05-02 DIAGNOSIS — M62838 Other muscle spasm: Secondary | ICD-10-CM

## 2020-05-02 DIAGNOSIS — I152 Hypertension secondary to endocrine disorders: Secondary | ICD-10-CM

## 2020-05-02 DIAGNOSIS — I10 Essential (primary) hypertension: Secondary | ICD-10-CM | POA: Diagnosis not present

## 2020-05-02 DIAGNOSIS — E7800 Pure hypercholesterolemia, unspecified: Secondary | ICD-10-CM

## 2020-05-02 DIAGNOSIS — Z23 Encounter for immunization: Secondary | ICD-10-CM

## 2020-05-02 DIAGNOSIS — E1151 Type 2 diabetes mellitus with diabetic peripheral angiopathy without gangrene: Secondary | ICD-10-CM

## 2020-05-02 DIAGNOSIS — E1142 Type 2 diabetes mellitus with diabetic polyneuropathy: Secondary | ICD-10-CM

## 2020-05-02 DIAGNOSIS — D72819 Decreased white blood cell count, unspecified: Secondary | ICD-10-CM | POA: Diagnosis not present

## 2020-05-02 DIAGNOSIS — G8220 Paraplegia, unspecified: Secondary | ICD-10-CM

## 2020-05-02 DIAGNOSIS — I2694 Multiple subsegmental pulmonary emboli without acute cor pulmonale: Secondary | ICD-10-CM

## 2020-05-02 LAB — POCT GLYCOSYLATED HEMOGLOBIN (HGB A1C): Hemoglobin A1C: 6 % — AB (ref 4.0–5.6)

## 2020-05-02 MED ORDER — METFORMIN HCL ER 750 MG PO TB24: 750.0000 mg | ORAL_TABLET | Freq: Every day | ORAL | 3 refills | Status: DC

## 2020-05-02 MED ORDER — ATORVASTATIN CALCIUM 20 MG PO TABS: 20.0000 mg | ORAL_TABLET | ORAL | 3 refills | Status: DC

## 2020-05-02 MED ORDER — MIRABEGRON ER 25 MG PO TB24: 25.0000 mg | ORAL_TABLET | Freq: Every day | ORAL | 3 refills | Status: DC

## 2020-05-02 NOTE — Assessment & Plan Note (Addendum)
Repeat HgbA1c: 6.0 decrease metformin dose to 750mg  XR daily Advised to continue foot care and use of shoes at all times due to neuropathy

## 2020-05-02 NOTE — Addendum Note (Signed)
Addended by: Lynnea Ferrier on: 05/02/2020 04:24 PM   Modules accepted: Orders

## 2020-05-02 NOTE — Assessment & Plan Note (Signed)
repeat CBC with pathology smear review

## 2020-05-02 NOTE — Progress Notes (Signed)
Subjective:  Patient ID: Larry [name], male    DOB: 06-30-1950  Age: 69 y.o. MRN: [id number]  CC: Follow-up (3 month f/u DM, HTN, hyperlipidemia. Pt states last blood sugar he recorded was 114. Pt is not fasting today. Pt states he has been experiencing more stiffness in his legs x2-3 weeks. )  HPI Accompanied by friend.  DM (diabetes mellitus) ([place]) Repeat HgbA1c: 6.0 decrease metformin dose to 748m XR daily Advised to continue foot care and use of shoes at all times due to neuropathy   Hypertension BP at goal with clonidine, olmesartan/amlodipine/HCTZ. Low BP readings at home per pharmacist note 04/18/2020. Asymptomatic per patient, but not very mobile due to paraplegia. BP Readings from Last 3 Encounters:  05/02/20 126/72  02/01/20 122/60  11/14/19 128/60  resolved LE edema with compression stocking and temporal furosemide/potassium. He also made dietary changes: low sodium and adequate oral hydration  repeat BMP. Wean off clonidine over 2weeks due to hypotension Maintain other medications.  Paraplegia ([place] Needs rx for another wheelchair. Also requests for Tens Unit rx to help alleviate leg muscle spasm.  Minimal improvement with home PT. Reports current wheelchair does not lock-wheel-locks and anti-tippers are ineffective, hence poses an injury risk.   Leukocytopenia repeat CBC with pathology smear review  Elevated LDL cholesterol level Reports LE swelling with rosuvastatin in past. ASCVD risk of 32.2% Agreed to try atorvastatin Lipid Panel     Component Value Date/Time   CHOL 176 11/01/2019 1145   TRIG 69.0 11/01/2019 1145   HDL 49.50 11/01/2019 1145   CHOLHDL 4 11/01/2019 1145   VLDL 13.8 11/01/2019 1145   LDLCALC 113 (H) 11/01/2019 1145    start Lipitor 253m3x/week. F/up in 65m765monthReviewed past Medical, Social and Family history today.  Outpatient Medications Prior to Visit  Medication Sig Dispense Refill   ACCU-CHEK AVIVA PLUS test strip  TEST ONCE DAILY (IN THE  MORNING, FASTING) 100 strip 3   Accu-Chek FastClix Lancets MISC      aspirin 81 MG tablet Take 81 mg by mouth daily.     baclofen (LIORESAL) 20 MG tablet TAKE 1 TABLET BY MOUTH 3  TIMES DAILY 270 tablet 3   Blood Glucose Calibration (ACCU-CHEK AVIVA) SOLN      cloNIDine (CATAPRES) 0.1 MG tablet Weaning off: 1tab daily x1week, then 1tab EOD x 1week, then stop 180 tablet 3   ferrous sulfate 325 (65 FE) MG tablet Take 1 tablet (325 mg total) by mouth daily with breakfast. 90 tablet 3   finasteride (PROSCAR) 5 MG tablet TAKE 1 TABLET BY MOUTH  DAILY 90 tablet 3   Incontinence Supply Disposable (DEPEND ADJUSTABLE UNDERWEAR LG) MISC Use as needed. 30 each 12   Lancets Misc. (ACCU-CHEK FASTCLIX LANCET) KIT 1 Units by Does not apply route daily before breakfast. 1 kit 11   meloxicam (MOBIC) 15 MG tablet Take 1 tablet (15 mg total) by mouth daily as needed. for pain. Please request future refills from PCP. 90 tablet 0   Multiple Vitamin (MULTIVITAMIN) tablet Take 1 tablet by mouth daily.     Olmesartan-amLODIPine-HCTZ 40-10-12.5 MG TABS TAKE 1 TABLET BY MOUTH  DAILY 90 tablet 2   pregabalin (LYRICA) 75 MG capsule Take 1 capsule (75 mg total) by mouth 2 (two) times daily. 180 capsule 3   traZODone (DESYREL) 50 MG tablet Take 1 tablet (50 mg total) by mouth at bedtime. 90 tablet 3   cloNIDine (CATAPRES) 0.1 MG tablet TAKE 1 TABLET BY MOUTH  TWICE DAILY 180 tablet 3   furosemide (LASIX) 20 MG tablet Take 1 tablet (20 mg total) by mouth daily. In AM 5 tablet 0   metFORMIN (GLUCOPHAGE) 850 MG tablet TAKE 1 TABLET BY MOUTH  TWICE DAILY WITH MEALS.  NEEDS OFFICE VISIT FOR  ADDITIONAL REFILLS 180 tablet 1   mirabegron ER (MYRBETRIQ) 25 MG TB24 tablet Take 1 tablet (25 mg total) by mouth daily. 30 tablet 5   potassium chloride SA (KLOR-CON) 20 MEQ tablet Take 1 tablet (20 mEq total) by mouth daily. 5 tablet 0   No facility-administered medications prior to visit.     ROS See HPI  Objective:  BP 126/72 (BP Location: Left Arm, Patient Position: Sitting, Cuff Size: Normal)    Pulse 78    Temp (!) 97.5 F (36.4 C) (Temporal)    Ht '5\' 7"'  (1.702 m)    Wt 155 lb (70.3 kg)    SpO2 98%    BMI 24.28 kg/m   Physical Exam Cardiovascular:     Rate and Rhythm: Normal rate and regular rhythm.     Pulses: Normal pulses.     Heart sounds: Normal heart sounds.  Pulmonary:     Effort: Pulmonary effort is normal.     Breath sounds: Normal breath sounds.  Musculoskeletal:     Cervical back: Normal range of motion and neck supple.     Right lower leg: No edema.     Left lower leg: No edema.  Neurological:     Mental Status: He is alert and oriented to person, place, and time.    Assessment & Plan:  This visit occurred during the SARS-CoV-2 public health emergency.  Safety protocols were in place, including screening questions prior to the visit, additional usage of staff PPE, and extensive cleaning of exam room while observing appropriate contact time as indicated for disinfecting solutions.   Larry [name] was seen today for follow-up.  Diagnoses and all orders for this visit:  Primary hypertension -     Cancel: Basic metabolic panel -     Basic metabolic panel; Future  Influenza vaccine needed -     Flu Vaccine QUAD High Dose(Fluad)  Type 2 diabetes mellitus with diabetic polyneuropathy, without long-term current use of insulin (HCC) -     Cancel: Hemoglobin A1c -     Cancel: Basic metabolic panel -     POCT glycosylated hemoglobin (Hb A1C)  Elevated LDL cholesterol level -     atorvastatin (LIPITOR) 20 MG tablet; Take 1 tablet (20 mg total) by mouth 3 (three) times a week.  Leukopenia, unspecified type -     Cancel: CBC with Differential/Platelet -     Pathologist smear review -     CBC with Differential/Platelet; Future  Paraplegia (HCC) -     Tens unit -     DME Wheelchair manual  Muscle spasm of both lower legs -     Tens unit  Multiple  subsegmental pulmonary emboli without acute cor pulmonale (HCC) -     CT Chest W Contrast; Future  Overactive bladder -     mirabegron ER (MYRBETRIQ) 25 MG TB24 tablet; Take 1 tablet (25 mg total) by mouth daily.  Controlled type 2 DM with peripheral circulatory disorder (HCC) -     metFORMIN (GLUCOPHAGE XR) 750 MG 24 hr tablet; Take 1 tablet (750 mg total) by mouth daily with breakfast.  Peripheral polyneuropathy  Hypertension due to endocrine disorder    Problem List Items Addressed  This Visit      Cardiovascular and Mediastinum   Hypertension - Primary    BP at goal with clonidine, olmesartan/amlodipine/HCTZ. Low BP readings at home per pharmacist note 04/18/2020. Asymptomatic per patient, but not very mobile due to paraplegia. BP Readings from Last 3 Encounters:  05/02/20 126/72  02/01/20 122/60  11/14/19 128/60  resolved LE edema with compression stocking and temporal furosemide/potassium. He also made dietary changes: low sodium and adequate oral hydration  repeat BMP. Wean off clonidine over 2weeks due to hypotension Maintain other medications.      Relevant Medications   atorvastatin (LIPITOR) 20 MG tablet (Start on 05/03/2020)   cloNIDine (CATAPRES) 0.1 MG tablet   Other Relevant Orders   Basic metabolic panel   Multiple subsegmental pulmonary emboli without acute cor pulmonale (HCC)   Relevant Medications   atorvastatin (LIPITOR) 20 MG tablet (Start on 05/03/2020)   cloNIDine (CATAPRES) 0.1 MG tablet   Other Relevant Orders   CT Chest W Contrast     Endocrine   DM (diabetes mellitus) ([place])    Repeat HgbA1c: 6.0 decrease metformin dose to 730m XR daily Advised to continue foot care and use of shoes at all times due to neuropathy       Relevant Medications   atorvastatin (LIPITOR) 20 MG tablet (Start on 05/03/2020)   metFORMIN (GLUCOPHAGE XR) 750 MG 24 hr tablet   Other Relevant Orders   POCT glycosylated hemoglobin (Hb A1C) (Completed)     Nervous and  Auditory   Paraplegia ([place]    Needs rx for another wheelchair. Also requests for Tens Unit rx to help alleviate leg muscle spasm.  Minimal improvement with home PT. Reports current wheelchair does not lock-wheel-locks and anti-tippers are ineffective, hence poses an injury risk.       Relevant Orders   Tens unit   DME Wheelchair manual   Peripheral neuropathy   Relevant Medications   cloNIDine (CATAPRES) 0.1 MG tablet     Genitourinary   Overactive bladder   Relevant Medications   mirabegron ER (MYRBETRIQ) 25 MG TB24 tablet     Other   Elevated LDL cholesterol level    Reports LE swelling with rosuvastatin in past. ASCVD risk of 32.2% Agreed to try atorvastatin Lipid Panel     Component Value Date/Time   CHOL 176 11/01/2019 1145   TRIG 69.0 11/01/2019 1145   HDL 49.50 11/01/2019 1145   CHOLHDL 4 11/01/2019 1145   VLDL 13.8 11/01/2019 1145   LDLCALC 113 (H) 11/01/2019 1145    start Lipitor 2664m3x/week. F/up in 64m67month    Relevant Medications   atorvastatin (LIPITOR) 20 MG tablet (Start on 05/03/2020)   Leukocytopenia    repeat CBC with pathology smear review      Relevant Orders   Pathologist smear review   CBC with Differential/Platelet    Other Visit Diagnoses    Influenza vaccine needed       Relevant Orders   Flu Vaccine QUAD High Dose(Fluad) (Completed)   Muscle spasm of both lower legs       Relevant Orders   Tens unit   Controlled type 2 DM with peripheral circulatory disorder [place]     Relevant Medications   atorvastatin (LIPITOR) 20 MG tablet (Start on 05/03/2020)   metFORMIN (GLUCOPHAGE XR) 750 MG 24 hr tablet   cloNIDine (CATAPRES) 0.1 MG tablet      Follow-up: Return in about 6 months (around 10/31/2020) for DM and HTN, hyperlipidemia (  fasting, 65mns).  CWilfred Lacy NP

## 2020-05-02 NOTE — Patient Instructions (Signed)
Go to lab for blood draw.  You will be contacted to schedule an appt for repeat CT chest.

## 2020-05-02 NOTE — Assessment & Plan Note (Signed)
Reports LE swelling with rosuvastatin in past. ASCVD risk of 32.2% Agreed to try atorvastatin Lipid Panel     Component Value Date/Time   CHOL 176 11/01/2019 1145   TRIG 69.0 11/01/2019 1145   HDL 49.50 11/01/2019 1145   CHOLHDL 4 11/01/2019 1145   VLDL 13.8 11/01/2019 1145   LDLCALC 113 (H) 11/01/2019 1145    start Lipitor 20mg  3x/week. F/up in 55months

## 2020-05-02 NOTE — Assessment & Plan Note (Addendum)
Needs rx for another wheelchair. Also requests for Tens Unit rx to help alleviate leg muscle spasm.  Minimal improvement with home PT. Reports current wheelchair does not lock-wheel-locks and anti-tippers are ineffective, hence poses an injury risk.

## 2020-05-02 NOTE — Assessment & Plan Note (Addendum)
BP at goal with clonidine, olmesartan/amlodipine/HCTZ. Low BP readings at home per pharmacist note 04/18/2020. Asymptomatic per patient, but not very mobile due to paraplegia. BP Readings from Last 3 Encounters:  05/02/20 126/72  02/01/20 122/60  11/14/19 128/60  resolved LE edema with compression stocking and temporal furosemide/potassium. He also made dietary changes: low sodium and adequate oral hydration  repeat BMP. Wean off clonidine over 2weeks due to hypotension Maintain other medications.

## 2020-05-03 ENCOUNTER — Other Ambulatory Visit: Payer: Self-pay | Admitting: Neurology

## 2020-05-03 ENCOUNTER — Other Ambulatory Visit (INDEPENDENT_AMBULATORY_CARE_PROVIDER_SITE_OTHER): Payer: Medicare Other

## 2020-05-03 ENCOUNTER — Other Ambulatory Visit: Payer: Self-pay

## 2020-05-03 DIAGNOSIS — D72819 Decreased white blood cell count, unspecified: Secondary | ICD-10-CM

## 2020-05-03 DIAGNOSIS — I10 Essential (primary) hypertension: Secondary | ICD-10-CM | POA: Diagnosis not present

## 2020-05-03 LAB — BASIC METABOLIC PANEL
BUN: 24 mg/dL — ABNORMAL HIGH (ref 6–23)
CO2: 31 mEq/L (ref 19–32)
Calcium: 9.8 mg/dL (ref 8.4–10.5)
Chloride: 102 mEq/L (ref 96–112)
Creatinine, Ser: 1.03 mg/dL (ref 0.40–1.50)
GFR: 72.97 mL/min (ref >60.00)
Glucose, Bld: 133 mg/dL — ABNORMAL HIGH (ref 70–99)
Potassium: 3.9 mEq/L (ref 3.5–5.1)
Sodium: 141 mEq/L (ref 135–145)

## 2020-05-03 LAB — CBC WITH DIFFERENTIAL/PLATELET
Basophils Absolute: 0 10*3/uL (ref 0.0–0.1)
Basophils Relative: 0.7 % (ref 0.0–3.0)
Eosinophils Absolute: 0.1 10*3/uL (ref 0.0–0.7)
Eosinophils Relative: 2.1 % (ref 0.0–5.0)
HCT: 37.4 % — ABNORMAL LOW (ref 39.0–52.0)
Hemoglobin: 12.5 g/dL — ABNORMAL LOW (ref 13.0–17.0)
Lymphocytes Relative: 10.4 % — ABNORMAL LOW (ref 12.0–46.0)
Lymphs Abs: 0.5 10*3/uL — ABNORMAL LOW (ref 0.7–4.0)
MCHC: 33.4 g/dL (ref 30.0–36.0)
MCV: 90.7 fl (ref 78.0–100.0)
Monocytes Absolute: 0.3 10*3/uL (ref 0.1–1.0)
Monocytes Relative: 5.2 % (ref 3.0–12.0)
Neutro Abs: 4 10*3/uL (ref 1.4–7.7)
Neutrophils Relative %: 81.6 % — ABNORMAL HIGH (ref 43.0–77.0)
Platelets: 254 10*3/uL (ref 150.0–400.0)
RBC: 4.12 Mil/uL — ABNORMAL LOW (ref 4.22–5.81)
RDW: 15.1 % (ref 11.5–15.5)
WBC: 4.8 10*3/uL (ref 4.0–10.5)

## 2020-05-03 LAB — PATHOLOGIST SMEAR REVIEW

## 2020-05-03 NOTE — Addendum Note (Signed)
Addended by: Lynnea Ferrier on: 05/03/2020 08:11 AM   Modules accepted: Orders

## 2020-05-09 ENCOUNTER — Other Ambulatory Visit: Payer: Self-pay | Admitting: *Deleted

## 2020-05-09 NOTE — Patient Outreach (Signed)
Aging Gracefully Program  05/09/2020  Larry [name] 15-Oct-1950 [id number]  Placed call toKenneth [place] Gracefully RN# 4 (final) clientvisit;unsuccessful attempt  Attempted calls to both numbers listed for client, HIPAA compliant voice mail message left for client on his home number, requesting return call back.  Plan:  Will re-attempt outreach to patient again next week if I do not hear back from him prior to then  Oneta Rack, RN, BSN, [employer] Coordinator [facility] Care Management  [phone]

## 2020-05-14 ENCOUNTER — Other Ambulatory Visit: Payer: Self-pay | Admitting: *Deleted

## 2020-05-14 NOTE — Patient Outreach (Signed)
Aging Gracefully Program  05/14/2020  Kourtney Montesinos [birth year] [id number]  Placed call toKenneth DanielstocompleteAging Gracefully RN# 4 (final) clientvisit;unsuccessful (consecutive) second outreach attempt  Attempted calls to both numbers listed for client, HIPAA compliant voice mail message left for client on his both numbers, requesting return call back.  Plan:  Will re-attempt outreach to patient again next week if I do not hear back from him prior to then  Oneta Rack, RN, BSN, [employer] Coordinator [facility] Care Management  [phone]

## 2020-05-15 ENCOUNTER — Encounter: Payer: Self-pay | Admitting: *Deleted

## 2020-05-15 ENCOUNTER — Other Ambulatory Visit: Payer: Self-pay | Admitting: *Deleted

## 2020-05-15 NOTE — Patient Outreach (Signed)
Aging Gracefully Program  RN Visit  05/15/2020  Larry [name] 12-20-1950 [id number]  Visit:   RN # 4- final/ by phone  Start Time:   2:10 pm End Time:   2:30 pm Total Minutes:   20 minutes  Readiness To Change Score:  Readiness to Change Score: 10  Universal RN Interventions: Calendar Distribution: Yes (reviewed with client today) Exercise Review: Yes (reviewed with client today) Medications: Yes (reviewed with client today) Medication Changes: Yes (reviewed with client today) Mood: Yes (reviewed with client today) Pain: Yes (reviewed with client today) PCP Advocacy/Support: Yes (reviewed with client today) Fall Prevention: Yes (reviewed with client today) Incontinence: Yes (reviewed with client today)  Healthcare Provider Communication: Did Higher education careers adviser With [place] Provider?: No  Clinician View of Client Situation: Clinician View Of Client Situation: Larry [name] has made great progress in successfully achieving his previously established AGRN goals around fall prevention; he reports no new or recent falls; continues to acknowledge his limitations due to being in a wheelchair, but has maintained strategies we have previously discussed despite his challenges.  Larry [name] reports he will consider ongoing PT as an outpatient, as this was most helpful to him when he had home health services.  He expects the home repair team to begin work on his home next week.   Client's View of His/Her Situation: Ambulance person Of His/Her Situation: "I am feeling a little more stiff now that my home health PT sessions have stopped; I try to do as many of the exercises as possible- but as you know, I am limited by this wheelchair; I will consider asking my doctor about outpatient PT, as you have suggested; I just don't like getting out if I don't have to.  The medication changes my doctor made has helped me; I am glad I am off of the blood pressure medication.  I'm still using the compression stockings  about every other day; they are helping.  I also am going to get a new wheelchair and a muscle stimulator- my doctor has ordered both and I am just waiting on them.  They should be starting the home repairs soon; I am looking forward to the work getting started."  OT Update: Will update Aging gracefully team around successful completion of final Aging Gracefully RN Visit # 4 by phone today  Session Summary: Pleasant AGRN Visit # 4 by phone; patient has successfully achieved his previously established AG goals and verbalizes ongoing confidence in remaining safe and fall-free at his home, despite his challenges with paraplegia and being wheelchair bound.  He is considering ongoing physical therapy as an outpatient, as this was very helpful to him while it was active through home health services.    I appreciate the opportunity to have participated in [place] care,  Oneta Rack, RN, BSN, [employer] Coordinator [facility] Care Management  [phone]

## 2020-05-15 NOTE — Patient Instructions (Signed)
Goals Addressed              This Visit's Progress   .  COMPLETED: "I want to stay as independent as I can despite living alone and being tied down to my wheelchair" (pt-stated)        CLIENT/RN [place]  Registered Nurse:  Reginia Naas RN Date: May 15, 2020  Client Name: Larry [name] Client ID: March [id number]   Target Area:  FALL PREVENTION    Why Problem May Occur: Mobility issues; wheelchair use; medications   Target Goal: No Falls   STRATEGIES Coping Strategies Ideas  Change Positions Slowly: lying to sitting, sitting to standing . Changing positions can make people lightheaded. . When getting up in the morning sit for a few minutes, before getting in your wheelchair.  . Stand for a few minutes before walking and hold on to sturdy furniture or countertop.  Timmothy S[place] for anything: take your time . For tasks you don't do every day, visualize yourself completing the task before you begin it; think about how you will accomplish the task and problems you may encounter  Drink water- stay hydrated . Dehydration can make people dizzy. . Ask your healthcare provider how much water you can drink. . Decrease caffeine, caffeine makes you urinate a lot, which can make you dehydrated. .   If you fall, tell someone- Great job in not falling!  Keep up the great work!  Good news that you are getting a new wheelchair! . Tell a friend or family member even if you didn't get hurt. . Tell your healthcare provider if you fall.  They can help you figure out why. . Write down any falls you experience on your calendar along with the circumstances around the fall,so you can keep track of what happened to make you fall . Use your life alert system and keep your cell phone nearby you at all times  Get your vision and hearing checked . Poor vision and hearing loss can make people fall. . Glaucoma and diabetes can cause poor vision. Doristine Devoid job getting your recent eye exam- make it  a part of your annual routine to have an eye exam  Other .    Prevention Ideas  Review your Medicines-- I am glad that you are using your pill box and finding it helpful!  I was glad to hear that your medicines were reviewed and adjusted . Many medicines can make you dizzy or sleepy and increase your risk of falling. . Your Aging Gracefully Nurse will look at your medications and see if you are taking any that might cause falling. . Your blood pressure medications and the medicine to help you sleep could cause you to feel light headed; if you start feeling this way, tell your doctor  Activity and Exercise-- Consider asking your doctor about outpatient physical therapy, now that your home sessions are over-- structured activity with assistance from PT was very beneficial and helpful in keeping you from becoming stiff and remaining flexible; I also hope that the muscle stimulator is helpful to you     . Aging Gracefully exercises- do the ones that you are able . Walking (inside or outside) . Dancing . Gardening . Housework:  cooking, Education administrator, Medical sales representative . Exercise while watching TV . Swimming or water aerobics. . Continue doing your personal exercise routine with your weights at home . Once gyms are open again, consider calling your insurance program to find our  about the wellness benefit; maybe you and a friend could start going together  Strengthen Bones . Exercise makes your bones stronger. . Vitamin D and Calcium make your bones stronger.  Ask your Healthcare provider if you should be taking them. . Your body makes vitamin D from the sun.  Sit in the sun for 10 minutes every day (without sunscreen). .   Control your urine  . Prevent constipation . Cut back on caffeinated drinks. . Don't wait to urinate. . If diabetic, control your blood sugar. . Ask your Aging Gracefully Nurse and Healthcare Provider  about urine control. . Try the Kegel exercises we talked about during our visit; these  exercises strengthen the muscles that start and stop your urine flow  Control your blood sugar (if you are diabetic) . High blood sugar can cause frequent urination, poor vision, and numbness in your feet, which can make you fall.   . Low blood sugar can cause confusion and dizziness. . Sit down and take your blood sugar if you experience any of these symptoms  Other coping strategies 1.Always wear good sturdy shoes with good soles  2.If your feet/ legs continue to have swelling, let your doctor know- I am glad that you are still wearing the compression stockings/ socks every-other day: if you have ongoing unresolved swelling, you should let your doctor know-- this is especially important if you feel short of breath  3. Talk to your doctor about options to receive physical therapy in your home-- you may want to look in to private duty physical therapy.  Your doctor may also have a physical therapist located at the office  4. Talk to your doctor about your sleep interruptions    PRACTICE It is important to practice the strategies so we can determine if they will be effective in helping to reach the goal.    Follow these specific recommendations:        If strategy does not work the first time, try it again.     We may make some changes over the next few sessions.      Oneta Rack, RN, BSN, [employer] [facility] Care Management  [phone]

## 2020-05-19 ENCOUNTER — Other Ambulatory Visit: Payer: Self-pay | Admitting: Neurology

## 2020-05-23 ENCOUNTER — Ambulatory Visit
Admission: RE | Admit: 2020-05-23 | Discharge: 2020-05-23 | Disposition: A | Discharge status: Home / Self Care | Payer: Medicare Other | Source: Ambulatory Visit | Attending: Nurse Practitioner | Admitting: Nurse Practitioner

## 2020-05-23 DIAGNOSIS — M4316 Spondylolisthesis, lumbar region: Secondary | ICD-10-CM | POA: Diagnosis not present

## 2020-05-23 DIAGNOSIS — I2510 Atherosclerotic heart disease of native coronary artery without angina pectoris: Secondary | ICD-10-CM | POA: Diagnosis not present

## 2020-05-23 DIAGNOSIS — G35 Multiple sclerosis: Secondary | ICD-10-CM | POA: Diagnosis not present

## 2020-05-23 DIAGNOSIS — I774 Celiac artery compression syndrome: Secondary | ICD-10-CM | POA: Diagnosis not present

## 2020-05-23 DIAGNOSIS — G8220 Paraplegia, unspecified: Secondary | ICD-10-CM | POA: Diagnosis not present

## 2020-05-23 DIAGNOSIS — R918 Other nonspecific abnormal finding of lung field: Secondary | ICD-10-CM | POA: Diagnosis not present

## 2020-05-23 DIAGNOSIS — I2694 Multiple subsegmental pulmonary emboli without acute cor pulmonale: Secondary | ICD-10-CM

## 2020-05-23 DIAGNOSIS — M4802 Spinal stenosis, cervical region: Secondary | ICD-10-CM | POA: Diagnosis not present

## 2020-05-23 MED ORDER — IOPAMIDOL (ISOVUE-300) INJECTION 61%
75.0000 mL | Freq: Once | INTRAVENOUS | Status: AC | PRN
  Administered 2020-05-23: 75 mL via INTRAVENOUS

## 2020-06-06 ENCOUNTER — Telehealth: Payer: Self-pay

## 2020-06-06 NOTE — Progress Notes (Signed)
Chronic Care Management Pharmacy Assistant   Name: Rama Mcclintock  MRN: [id number] DOB: 07-28-1950  Reason for Encounter:Diabetes Disease State Call.  PCP : Flossie Buffy, NP  Allergies:   Allergies  Allergen Reactions  . No Known Allergies   . Rosuvastatin Other (See Comments)    Leg swelling    Medications: Outpatient Encounter Medications as of 06/06/2020  Medication Sig  . ACCU-CHEK AVIVA PLUS test strip TEST ONCE DAILY (IN THE  MORNING, FASTING)  . Accu-Chek FastClix Lancets MISC   . aspirin 81 MG tablet Take 81 mg by mouth daily.  Marland Kitchen atorvastatin (LIPITOR) 20 MG tablet Take 1 tablet (20 mg total) by mouth 3 (three) times a week.  . baclofen (LIORESAL) 20 MG tablet TAKE 1 TABLET BY MOUTH 3  TIMES DAILY  . Blood Glucose Calibration (ACCU-CHEK AVIVA) SOLN   . cloNIDine (CATAPRES) 0.1 MG tablet Weaning off: 1tab daily x1week, then 1tab EOD x 1week, then stop  . ferrous sulfate 325 (65 FE) MG tablet Take 1 tablet (325 mg total) by mouth daily with breakfast.  . finasteride (PROSCAR) 5 MG tablet TAKE 1 TABLET BY MOUTH  DAILY  . Incontinence Supply Disposable (DEPEND ADJUSTABLE UNDERWEAR LG) MISC Use as needed.  . Lancets Misc. (ACCU-CHEK FASTCLIX LANCET) KIT 1 Units by Does not apply route daily before breakfast.  . meloxicam (MOBIC) 15 MG tablet Take 1 tablet (15 mg total) by mouth daily as needed. for pain. Please make yearly follow up for continued refills or may ask PCP to manage this med.  . metFORMIN (GLUCOPHAGE XR) 750 MG 24 hr tablet Take 1 tablet (750 mg total) by mouth daily with breakfast.  . mirabegron ER (MYRBETRIQ) 25 MG TB24 tablet Take 1 tablet (25 mg total) by mouth daily.  . Multiple Vitamin (MULTIVITAMIN) tablet Take 1 tablet by mouth daily.  . Olmesartan-amLODIPine-HCTZ 40-10-12.5 MG TABS TAKE 1 TABLET BY MOUTH  DAILY  . pregabalin (LYRICA) 75 MG capsule Take 1 capsule (75 mg total) by mouth 2 (two) times daily.  . traZODone (DESYREL) 50 MG tablet Take  1 tablet (50 mg total) by mouth at bedtime.   No facility-administered encounter medications on file as of 06/06/2020.    Current Diagnosis: Patient Active Problem List   Diagnosis Date Noted  . Bilateral leg edema 02/09/2020  . Elevated LDL cholesterol level 11/09/2019  . Multiple subsegmental pulmonary emboli without acute cor pulmonale ([place]) 11/01/2019  . Pressure injury of skin 09/25/2019  . Acute respiratory failure due to COVID-19 ([place]) 09/23/2019  . Benign prostatic hyperplasia with lower urinary tract symptoms 09/14/2019  . Cervical stenosis of spinal canal 05/09/2019  . Thoracic spinal stenosis 05/09/2019  . Gait abnormality 08/08/2018  . Spinal stenosis 08/08/2018  . Iron deficiency anemia 02/09/2018  . Bowel incontinence 01/01/2017  . Vitamin D deficiency 01/11/2016  . GERD (gastroesophageal reflux disease) 01/11/2016  . Spinal stenosis, lumbar region, with neurogenic claudication 01/10/2016  . Spinal stenosis of lumbar region with radiculopathy 01/10/2016  . Elective surgery   . Post-operative pain   . Tachycardia   . Peripheral neuropathy   . Spondylolisthesis of lumbar region 12/30/2015  . CTS (carpal tunnel syndrome) 09/26/2015  . Lumbar stenosis 09/26/2015  . Weakness 09/05/2015  . Spinal stenosis in cervical region 06/12/2015  . Leukocytopenia 05/22/2013  . History of spinal cord injury 12/12/2011  . Paraplegia ([place]) 12/11/2011  . Altered mental status 12/11/2011  . DM (diabetes mellitus) ([place]) 12/11/2011  . Overactive bladder 12/11/2011  .  MS (multiple sclerosis) ([place])   . Hypertension     Goals Addressed   None    Recent Relevant Labs: Lab Results  Component Value Date/Time   HGBA1C 6.0 (A) 05/02/2020 03:09 PM   HGBA1C 6.1 11/01/2019 11:43 AM   HGBA1C 5.9 03/24/2019 03:55 PM   MICROALBUR 4.7 (H) 07/27/2016 12:57 PM    Kidney Function Lab Results  Component Value Date/Time   CREATININE 1.03 05/03/2020 11:11 AM   CREATININE 0.86 11/01/2019  11:43 AM   CREATININE 1.0 01/04/2014 09:43 AM   CREATININE 0.9 05/22/2013 11:15 AM   GFR 72.97 05/03/2020 11:11 AM   GFRNONAA >60 09/30/2019 02:24 AM   GFRNONAA >89 12/25/2011 11:32 AM   GFRAA >60 09/30/2019 02:24 AM   GFRAA >89 12/25/2011 11:32 AM    . Current antihyperglycemic regimen:   metformin  744m XR Daily . What recent interventions/DTPs have been made to improve glycemic control:  o 05/02/2020 decrease metformin dose to 7573mXR Daily  . Have there been any recent hospitalizations or ED visits since last visit with CPP? No . Patient denies hypoglycemic symptoms, including Pale, Sweaty, Shaky, Hungry, Nervous/irritable and Vision changes . Patient denies hyperglycemic symptoms, including blurry vision, excessive thirst, fatigue, polyuria and weakness . How often are you checking your blood sugar? Patient states he checks his blood sugar every other week. . What are your blood sugars ranging? Patient states his blood sugar has been ranging around 116. o Fasting: N/A o Before meals: N/A o After meals: N/A o Bedtime: N/A . During the week, how often does your blood glucose drop below 70? Never . Are you checking your feet daily/regularly?   Patient states he has always had numbness in both of his feet, but denies pain or tingling sensation.  Adherence Review: Is the patient currently on a STATIN medication? Yes Is the patient currently on ACE/ARB medication? Yes Does the patient have >5 day gap between last estimated fill dates? Yes   BeMaryjean KaFollow-Up:  Pharmacist Review    BeAnderson Maltalinical Pharmacist Assistant [phone]

## 2020-06-23 DIAGNOSIS — G35 Multiple sclerosis: Secondary | ICD-10-CM | POA: Diagnosis not present

## 2020-06-23 DIAGNOSIS — G8220 Paraplegia, unspecified: Secondary | ICD-10-CM | POA: Diagnosis not present

## 2020-06-23 DIAGNOSIS — M4802 Spinal stenosis, cervical region: Secondary | ICD-10-CM | POA: Diagnosis not present

## 2020-06-23 DIAGNOSIS — M4316 Spondylolisthesis, lumbar region: Secondary | ICD-10-CM | POA: Diagnosis not present

## 2020-06-24 ENCOUNTER — Other Ambulatory Visit: Payer: Self-pay | Admitting: Occupational Therapy

## 2020-06-24 ENCOUNTER — Other Ambulatory Visit: Payer: Self-pay

## 2020-06-24 NOTE — Patient Instructions (Signed)
Goals Addressed            This Visit's Progress   . Patient Stated       Ease and safety with showering (including two grab bars in shower (one on long wall partially beside tub bench spanning up towards front wall of shower and one at front wall of shower; as well as widening hallway door to bathroom.  ACTION PLANNING - BATHING Target Problem Area: Safety in shower  Why Problem May Occur: Weakness in lower extremities and lack of grab bars  Target Goal: Independence and safety with showering   STRATEGIES Saving Your Energy: DO: DON'T:  Use a tub bench/seat Stand  too long , it uses more energy  Keep all items you'll need within easy reach Rush   Modifying your home environment and making it safe: DO: DON'T:  Install grab bars in the shower (DONE)   Place a rubber mat where you may be standing--(he will look into a new one)   Make sure the bathroom is well lit    Install a hand held shower head (he decided against this when the were doing the modifications)    Simplifying the way you set up tasks or daily routines: DO: DON'T:  Plan to bathe/shower before you're overly tired Rush through [employer] all items before getting started    Practice It is important to practice the strategies so we can determine if they will be effective in helping to reach your goal. Follow these specific recommendations: 1. Use grab bars as needed. 2. Get new shower mat or non skid decals If a strategy does not work the first time, try it again and again (and maybe again). We may make some changes over the next few sessions, based on how they work.  Golden Circle, OTR/L       06/24/2020    . COMPLETED: Patient Stated       Ease of dryer use (needs new knobs)

## 2020-06-24 NOTE — Patient Outreach (Signed)
Aging Gracefully Program  OT FINAL Visit  06/24/2020  Larry [name] Jul 29, 1950 [id number]  Visit:  4- Fourth Visit  Start Time:  3557 End Time:  1640 Total Minutes:  35  Readiness to Change:  Readiness to Change Score: 10  Patient Education: Education Provided: Yes Education Details: "Tips for Aging at Home" book Person(s) Educated: Patient  Goals: Goals Addressed            This Visit's Progress   . Patient Stated       Ease and safety with showering (including two grab bars in shower (one on long wall partially beside tub bench spanning up towards front wall of shower and one at front wall of shower; as well as widening hallway door to bathroom.  ACTION PLANNING - BATHING Target Problem Area: Safety in shower  Why Problem May Occur: Weakness in lower extremities and lack of grab bars  Target Goal: Independence and safety with showering   STRATEGIES Saving Your Energy: DO: DON'T:  Use a tub bench/seat Stand  too long , it uses more energy  Keep all items you'll need within easy reach Rush   Modifying your home environment and making it safe: DO: DON'T:  Install grab bars in the shower (DONE)   Place a rubber mat where you may be standing--(he will look into a new one)   Make sure the bathroom is well lit    Install a hand held shower head (he decided against this when the were doing the modifications)    Simplifying the way you set up tasks or daily routines: DO: DON'T:  Plan to bathe/shower before you're overly tired Rush through [employer] all items before getting started    Practice It is important to practice the strategies so we can determine if they will be effective in helping to reach your goal. Follow these specific recommendations: 1. Use grab bars as needed. 2. Get new shower mat or non skid decals If a strategy does not work the first time, try it again and again (and maybe again). We may make some changes over the next few  sessions, based on how they work.  Golden Circle, OTR/L       06/24/2020    . COMPLETED: Patient Stated       Ease of dryer use (needs new knobs)       Post Clinical Reasoning: Client Action (Goal) One Interventions: Grab bars have now been installed  in client's bathroom outside and inside shower Did Client Try?:  (client reports he has been using shower since grab bars were installed and it really helps him alot) Targeted Problem Area Status: A Lot Better Clinician View Of Client Situation:: Mr. Mckinnon is doing well. He is enjoying his home modifications and accessiblty modifications. Client View Of His/Her Situation:: He likes his home modifications and the adaptive equipment from the Aging Gracefully program. He reports he feels his legs are getting a little weaker and that he needs to start PT again. Made him aware to call his primary MD to get an order for either HHPT or OPPT. He also reported that on his right hand his 3 last digits on his hand get "stuck" sometimes and he has to use his left hand to open them back up--I gave himt some hand exercises to do with the theraputty he already has and recommended that if this did not help to then ask his doctor. He asked about an at home meal program (ie:  meals on wheels), I contacted the AG RN and she will look into it. Next Visit Plan:: This is the last OT visit

## 2020-07-04 ENCOUNTER — Other Ambulatory Visit: Payer: Self-pay

## 2020-07-04 NOTE — Patient Outreach (Signed)
Aging Gracefully Program  07/04/2020  Larry [name] [birth year] [id number]  [facility] Evaluation Interviewer attempted to call patient on today regarding Aging Gracefully referral. No answer from patient after multiple rings. CMA left confidential voice message.  Will attempt to call back within 1 week.  [place] Management Assistant [phone]

## 2020-07-08 ENCOUNTER — Other Ambulatory Visit: Payer: Self-pay

## 2020-07-08 NOTE — Patient Outreach (Signed)
Aging Gracefully Program  07/08/2020  Larry [name] 29-Mar-1951 [id number]   Received message from Wyline Mood about patient wanting resources for meals. Placed call to patient with no answer. Left a message and requested call back. Provided my contact information.  Tomasa Rand, RN, BSN, CEN [facility] [employer] [phone]

## 2020-07-09 ENCOUNTER — Other Ambulatory Visit: Payer: Self-pay

## 2020-07-09 NOTE — Patient Outreach (Signed)
Aging Gracefully Program  07/09/2020  Larry [name] 1951/04/17 [id number]  [facility] Evaluation Interviewer attempted to call patient on today regarding Aging Gracefully referral. No answer from patient after multiple rings. CMA left confidential voicemail to return call.  Will attempt to call back within 1 week.  [place] Management Assistant [phone]

## 2020-07-12 ENCOUNTER — Other Ambulatory Visit: Payer: Self-pay | Admitting: Neurology

## 2020-07-15 ENCOUNTER — Other Ambulatory Visit: Payer: Self-pay | Admitting: Neurology

## 2020-07-16 ENCOUNTER — Other Ambulatory Visit: Payer: Self-pay | Admitting: Neurology

## 2020-07-17 ENCOUNTER — Other Ambulatory Visit: Payer: Self-pay

## 2020-07-17 NOTE — Patient Outreach (Signed)
Aging Gracefully Program  07/17/2020  Larry [name] 1950/06/08 [id number]  [facility] Evaluation Interviewer made contact with patient. Aging Gracefully 5 month survey completed.   Interviewer will send referral to Darylene Price at [employer] for follow up.  [place] Management Assistant  [phone]

## 2020-07-24 DIAGNOSIS — M4316 Spondylolisthesis, lumbar region: Secondary | ICD-10-CM | POA: Diagnosis not present

## 2020-07-24 DIAGNOSIS — G8220 Paraplegia, unspecified: Secondary | ICD-10-CM | POA: Diagnosis not present

## 2020-07-24 DIAGNOSIS — M4802 Spinal stenosis, cervical region: Secondary | ICD-10-CM | POA: Diagnosis not present

## 2020-07-24 DIAGNOSIS — G35 Multiple sclerosis: Secondary | ICD-10-CM | POA: Diagnosis not present

## 2020-07-25 ENCOUNTER — Other Ambulatory Visit: Payer: Self-pay

## 2020-07-25 NOTE — Patient Outreach (Signed)
Aging Gracefully Program  07/25/2020  Larry [name] 10-Apr-1951 [id number]   Care coordination:  Spoke with patient who is interested in meals on wheels.  Placed call to senior resources and patient is on the list. Provided contact information.  PLAN: patient informed.  Tomasa Rand, RN, BSN, CEN [facility] [employer] [phone]

## 2020-07-28 ENCOUNTER — Other Ambulatory Visit: Payer: Self-pay | Admitting: Neurology

## 2020-07-29 ENCOUNTER — Other Ambulatory Visit: Payer: Self-pay | Admitting: Neurology

## 2020-07-29 ENCOUNTER — Other Ambulatory Visit: Payer: Self-pay

## 2020-07-30 ENCOUNTER — Other Ambulatory Visit: Payer: Self-pay | Admitting: Neurology

## 2020-07-31 ENCOUNTER — Other Ambulatory Visit: Payer: Self-pay

## 2020-07-31 ENCOUNTER — Encounter: Payer: Self-pay | Admitting: Nurse Practitioner

## 2020-07-31 ENCOUNTER — Ambulatory Visit (INDEPENDENT_AMBULATORY_CARE_PROVIDER_SITE_OTHER): Payer: Medicare Other | Admitting: Nurse Practitioner

## 2020-07-31 ENCOUNTER — Other Ambulatory Visit: Payer: Self-pay | Admitting: Neurology

## 2020-07-31 VITALS — BP 120/76 | HR 76 | Temp 97.5°F | Wt 167.0 lb

## 2020-07-31 DIAGNOSIS — R6889 Other general symptoms and signs: Secondary | ICD-10-CM | POA: Diagnosis not present

## 2020-07-31 DIAGNOSIS — G629 Polyneuropathy, unspecified: Secondary | ICD-10-CM

## 2020-07-31 DIAGNOSIS — D509 Iron deficiency anemia, unspecified: Secondary | ICD-10-CM | POA: Diagnosis not present

## 2020-07-31 LAB — CBC
HCT: 36.3 % — ABNORMAL LOW (ref 39.0–52.0)
Hemoglobin: 12.2 g/dL — ABNORMAL LOW (ref 13.0–17.0)
MCHC: 33.6 g/dL (ref 30.0–36.0)
MCV: 91.9 fl (ref 78.0–100.0)
Platelets: 264 10*3/uL (ref 150.0–400.0)
RBC: 3.95 Mil/uL — ABNORMAL LOW (ref 4.22–5.81)
RDW: 14.9 % (ref 11.5–15.5)
WBC: 4.1 10*3/uL (ref 4.0–10.5)

## 2020-07-31 MED ORDER — PREGABALIN 100 MG PO CAPS: 100.0000 mg | ORAL_CAPSULE | Freq: Two times a day (BID) | ORAL | 5 refills | Status: DC

## 2020-07-31 NOTE — Progress Notes (Signed)
Subjective:  Patient ID: Larry [name], male    DOB: 1950/12/01  Age: 70 y.o. MRN: [id number]  CC: Acute Visit (Pt c/o tingling in hands and feet at night along with being cold all the time.  Pt states his hands tingling in the morning some times also.)  HPI Accompanied by friend Larry [name] denies cold intolerance x 52month and persistent paresthesia in LE (tingling) Iron deficiency anemia Worsening cold intolerance, no melena, no hematuria, no ABD pain/nausea Repeat cbc and iron panel: normal TSH,persistent but stable CBC with low ferritin. Resume ferrous sufalte and ordered serial iFOB Negative cologuard No endoscopy complete in past.   Peripheral neuropathy Increase lyrica dose to 103mBID  Wt Readings from Last 3 Encounters:  07/31/20 167 lb (75.8 kg)  05/02/20 155 lb (70.3 kg)  04/09/20 155 lb (70.3 kg)   Reviewed past Medical, Social and Family history today.  Outpatient Medications Prior to Visit  Medication Sig Dispense Refill  . ACCU-CHEK AVIVA PLUS test strip TEST ONCE DAILY (IN THE  MORNING, FASTING) 100 strip 3  . Accu-Chek FastClix Lancets MISC     . aspirin 81 MG tablet Take 81 mg by mouth daily.    . Marland Kitchentorvastatin (LIPITOR) 20 MG tablet Take 1 tablet (20 mg total) by mouth 3 (three) times a week. 45 tablet 3  . baclofen (LIORESAL) 20 MG tablet TAKE 1 TABLET BY MOUTH 3  TIMES DAILY 270 tablet 3  . Blood Glucose Calibration (ACCU-CHEK AVIVA) SOLN     . finasteride (PROSCAR) 5 MG tablet TAKE 1 TABLET BY MOUTH  DAILY 90 tablet 3  . Incontinence Supply Disposable (DEPEND ADJUSTABLE UNDERWEAR LG) MISC Use as needed. 30 each 12  . Lancets Misc. (ACCU-CHEK FASTCLIX LANCET) KIT 1 Units by Does not apply route daily before breakfast. 1 kit 11  . meloxicam (MOBIC) 15 MG tablet Take 1 tablet (15 mg total) by mouth daily as needed. for pain. Please make yearly follow up for continued refills or may ask PCP to manage this med. 90 tablet 0  . metFORMIN (GLUCOPHAGE XR) 750 MG  24 hr tablet Take 1 tablet (750 mg total) by mouth daily with breakfast. 90 tablet 3  . mirabegron ER (MYRBETRIQ) 25 MG TB24 tablet Take 1 tablet (25 mg total) by mouth daily. 90 tablet 3  . Multiple Vitamin (MULTIVITAMIN) tablet Take 1 tablet by mouth daily.    . Olmesartan-amLODIPine-HCTZ 40-10-12.5 MG TABS TAKE 1 TABLET BY MOUTH  DAILY 90 tablet 2  . traZODone (DESYREL) 50 MG tablet Take 1 tablet (50 mg total) by mouth at bedtime. 90 tablet 3  . pregabalin (LYRICA) 75 MG capsule Take 1 capsule (75 mg total) by mouth 2 (two) times daily. 180 capsule 3  . cloNIDine (CATAPRES) 0.1 MG tablet Weaning off: 1tab daily x1week, then 1tab EOD x 1week, then stop (Patient not taking: Reported on 07/31/2020) 180 tablet 3  . ferrous sulfate 325 (65 FE) MG tablet Take 1 tablet (325 mg total) by mouth daily with breakfast. (Patient not taking: Reported on 07/31/2020) 90 tablet 3   No facility-administered medications prior to visit.   ROS See HPI  Objective:  BP 120/76 (BP Location: Left Arm, Patient Position: Sitting, Cuff Size: Normal)   Pulse 76   Temp (!) 97.5 F (36.4 C) (Temporal)   Wt 167 lb (75.8 kg)   SpO2 99%   BMI 26.16 kg/m   Physical Exam Cardiovascular:     Rate and Rhythm: Normal rate and regular  rhythm.     Pulses: Normal pulses.     Heart sounds: Normal heart sounds.  Pulmonary:     Effort: Pulmonary effort is normal.     Breath sounds: Normal breath sounds.  Musculoskeletal:     Right lower leg: Edema present.     Left lower leg: Edema present.  Neurological:     Mental Status: He is alert and oriented to person, place, and time.  Psychiatric:        Mood and Affect: Mood normal.        Behavior: Behavior normal.        Thought Content: Thought content normal.    Assessment & Plan:  This visit occurred during the SARS-CoV-2 public health emergency.  Safety protocols were in place, including screening questions prior to the visit, additional usage of staff PPE, and  extensive cleaning of exam room while observing appropriate contact time as indicated for disinfecting solutions.   Larry [name] was seen today for acute visit.  Diagnoses and all orders for this visit:  Peripheral polyneuropathy -     pregabalin (LYRICA) 100 MG capsule; Take 1 capsule (100 mg total) by mouth 2 (two) times daily.  Cold intolerance -     Iron, TIBC and Ferritin Panel -     CBC -     TSH  Iron deficiency anemia, unspecified iron deficiency anemia type -     Iron, TIBC and Ferritin Panel -     CBC -     Fecal occult blood, imunochemical([facility]/Sun[facility]); Standing   Problem List Items Addressed This Visit      Nervous and Auditory   Peripheral neuropathy - Primary    Increase lyrica dose to 140m BID      Relevant Medications   pregabalin (LYRICA) 100 MG capsule     Other   Iron deficiency anemia    Worsening cold intolerance, no melena, no hematuria, no ABD pain/nausea Repeat cbc and iron panel: normal TSH,persistent but stable CBC with low ferritin. Resume ferrous sufalte and ordered serial iFOB Negative cologuard No endoscopy complete in past.       Relevant Orders   Iron, TIBC and Ferritin Panel (Completed)   CBC (Completed)   Fecal occult blood, imunochemical([facility]/Sunquest)    Other Visit Diagnoses    Cold intolerance       Relevant Orders   Iron, TIBC and Ferritin Panel (Completed)   CBC (Completed)   TSH (Completed)      Follow-up: Return in about 3 months (around 10/31/2020) for DM and HTN, hyperlipidemia (HTN, 342ms).  ChWilfred LacyNP

## 2020-07-31 NOTE — Patient Instructions (Addendum)
Tax inspector for SCAT application: [phone]. Bring application to be completed. I recommend outpatient PT.  Lyrica dose increased to 100mg  BID.

## 2020-08-01 ENCOUNTER — Other Ambulatory Visit: Payer: Self-pay | Admitting: Neurology

## 2020-08-01 LAB — IRON,TIBC AND FERRITIN PANEL
%SAT: 27 % (calc) (ref 20–48)
Ferritin: 21 ng/mL — ABNORMAL LOW (ref 24–380)
Iron: 90 ug/dL (ref 50–180)
TIBC: 332 mcg/dL (calc) (ref 250–425)

## 2020-08-01 LAB — TSH: TSH: 1.15 u[IU]/mL (ref 0.35–4.50)

## 2020-08-04 ENCOUNTER — Encounter: Payer: Self-pay | Admitting: Nurse Practitioner

## 2020-08-04 NOTE — Assessment & Plan Note (Addendum)
Increase lyrica dose to 100mg  BID

## 2020-08-04 NOTE — Assessment & Plan Note (Addendum)
Worsening cold intolerance, no melena, no hematuria, no ABD pain/nausea Repeat cbc and iron panel: normal TSH,persistent but stable CBC with low ferritin. Resume ferrous sufalte and ordered serial iFOB Negative cologuard No endoscopy complete in past.

## 2020-08-05 ENCOUNTER — Telehealth: Payer: Self-pay

## 2020-08-05 NOTE — Progress Notes (Signed)
Chronic Care Management Pharmacy Assistant   Name: Larry [name]  MRN: [id number] DOB: December 15, 1950   Reason for Encounter: Hypertension Disease State Call.   Conditions to be addressed/monitored: HTN  Primary concerns for visit include: Hypertension   Recent office visits:  07/31/2020 PCP Wilfred Lacy started: pregabalin (LYRICA) 100 MG capsule; Take 1 capsule (100 mg total) by mouth 2 (two) times daily  Recent consult visits:  No Recent consult visit  Hospital visits:  None in previous 6 months  Medications: Outpatient Encounter Medications as of 08/05/2020  Medication Sig   ACCU-CHEK AVIVA PLUS test strip TEST ONCE DAILY (IN THE  MORNING, FASTING)   Accu-Chek FastClix Lancets MISC    aspirin 81 MG tablet Take 81 mg by mouth daily.   atorvastatin (LIPITOR) 20 MG tablet Take 1 tablet (20 mg total) by mouth 3 (three) times a week.   baclofen (LIORESAL) 20 MG tablet TAKE 1 TABLET BY MOUTH 3  TIMES DAILY   Blood Glucose Calibration (ACCU-CHEK AVIVA) SOLN    cloNIDine (CATAPRES) 0.1 MG tablet Weaning off: 1tab daily x1week, then 1tab EOD x 1week, then stop (Patient not taking: Reported on 07/31/2020)   ferrous sulfate 325 (65 FE) MG tablet Take 1 tablet (325 mg total) by mouth daily with breakfast. (Patient not taking: Reported on 07/31/2020)   finasteride (PROSCAR) 5 MG tablet TAKE 1 TABLET BY MOUTH  DAILY   Incontinence Supply Disposable (DEPEND ADJUSTABLE UNDERWEAR LG) MISC Use as needed.   Lancets Misc. (ACCU-CHEK FASTCLIX LANCET) KIT 1 Units by Does not apply route daily before breakfast.   meloxicam (MOBIC) 15 MG tablet Take 1 tablet (15 mg total) by mouth daily as needed. for pain. Please make yearly follow up for continued refills or may ask PCP to manage this med.   metFORMIN (GLUCOPHAGE XR) 750 MG 24 hr tablet Take 1 tablet (750 mg total) by mouth daily with breakfast.   mirabegron ER (MYRBETRIQ) 25 MG TB24 tablet Take 1 tablet (25 mg total) by mouth daily.    Multiple Vitamin (MULTIVITAMIN) tablet Take 1 tablet by mouth daily.   Olmesartan-amLODIPine-HCTZ 40-10-12.5 MG TABS TAKE 1 TABLET BY MOUTH  DAILY   pregabalin (LYRICA) 100 MG capsule Take 1 capsule (100 mg total) by mouth 2 (two) times daily.   traZODone (DESYREL) 50 MG tablet Take 1 tablet (50 mg total) by mouth at bedtime.   No facility-administered encounter medications on file as of 08/05/2020.      Star Rating Drugs:  Atorvastatin 20 mg tablet, Metformin 750 mg Tablet, Olmesartan-amlodipine- HCTZ40-10-12.5 mg.  Reviewed chart prior to disease state call. Spoke with patient regarding BP  Recent Office Vitals: BP Readings from Last 3 Encounters:  07/31/20 120/76  05/02/20 126/72  02/01/20 122/60   Pulse Readings from Last 3 Encounters:  07/31/20 76  05/02/20 78  02/01/20 80    Wt Readings from Last 3 Encounters:  07/31/20 167 lb (75.8 kg)  05/02/20 155 lb (70.3 kg)  04/09/20 155 lb (70.3 kg)     Kidney Function Lab Results  Component Value Date/Time   CREATININE 1.03 05/03/2020 11:11 AM   CREATININE 0.86 11/01/2019 11:43 AM   CREATININE 1.0 01/04/2014 09:43 AM   CREATININE 0.9 05/22/2013 11:15 AM   GFR 72.97 05/03/2020 11:11 AM   GFRNONAA >60 09/30/2019 02:24 AM   GFRNONAA >89 12/25/2011 11:32 AM   GFRAA >60 09/30/2019 02:24 AM   GFRAA >89 12/25/2011 11:32 AM    BMP Latest Ref Rng & Units  05/03/2020 11/01/2019 09/30/2019  Glucose 70 - 99 mg/dL 133(H) 117(H) 144(H)  BUN 6 - 23 mg/dL 24(H) 15 32(H)  Creatinine 0.40 - 1.50 mg/dL 1.03 0.86 0.88  Sodium 135 - 145 mEq/L 141 140 138  Potassium 3.5 - 5.1 mEq/L 3.9 4.1 4.6  Chloride 96 - 112 mEq/L 102 103 103  CO2 19 - 32 mEq/L _0 Calcium 8.4 - 10.5 mg/dL 9.8 9.6 8.9     Current antihypertensive regimen:  ? Olmesartan-Amlodipine-HCTZ 40-10-12.5 mg daily  ? Clonidine 0.1 mg BID  How often are you checking your Blood Pressure? infrequently  Current home BP readings:   Patient states he has not check  his blood pressure this week but last week it has been ranging around 120/60.  Patient denies headaches, dizziness or lightheadedness.   What recent interventions/DTPs have been made by any provider to improve Blood Pressure control since last CPP Visit: None ID   Any recent hospitalizations or ED visits since last visit with CPP? No  What diet changes have been made to improve Blood Pressure Control?  o Patient reports there has not been any changes in his diet.  What exercise is being done to improve your Blood Pressure Control?  o Patient states he barely exercise. Patient states he is working on exercising more.  Adherence Review: Is the patient currently on ACE/ARB medication? Yes Does the patient have >5 day gap between last estimated fill dates? No   Joycelyn Schmid Clinical Pharmacist Assistant [phone]

## 2020-08-21 DIAGNOSIS — M4316 Spondylolisthesis, lumbar region: Secondary | ICD-10-CM | POA: Diagnosis not present

## 2020-08-21 DIAGNOSIS — G8220 Paraplegia, unspecified: Secondary | ICD-10-CM | POA: Diagnosis not present

## 2020-08-21 DIAGNOSIS — G35 Multiple sclerosis: Secondary | ICD-10-CM | POA: Diagnosis not present

## 2020-08-21 DIAGNOSIS — M4802 Spinal stenosis, cervical region: Secondary | ICD-10-CM | POA: Diagnosis not present

## 2020-09-02 ENCOUNTER — Telehealth: Payer: Self-pay | Admitting: Nurse Practitioner

## 2020-09-02 NOTE — Telephone Encounter (Signed)
Patient dropped off forms from [employer] to be completed by his provider. Please mail paperwork once completed and call patient to let him know that it has been done.

## 2020-09-21 DIAGNOSIS — M4316 Spondylolisthesis, lumbar region: Secondary | ICD-10-CM | POA: Diagnosis not present

## 2020-09-21 DIAGNOSIS — M4802 Spinal stenosis, cervical region: Secondary | ICD-10-CM | POA: Diagnosis not present

## 2020-09-21 DIAGNOSIS — G35 Multiple sclerosis: Secondary | ICD-10-CM | POA: Diagnosis not present

## 2020-09-21 DIAGNOSIS — G8220 Paraplegia, unspecified: Secondary | ICD-10-CM | POA: Diagnosis not present

## 2020-09-23 ENCOUNTER — Telehealth: Payer: Self-pay | Admitting: Nurse Practitioner

## 2020-09-23 NOTE — Telephone Encounter (Signed)
Patient is calling to see what locations we use for PT. He does not want to be referred just yet, but wants to know locations. Please call him at [phone] and advise.

## 2020-09-25 NOTE — Telephone Encounter (Signed)
I called patient and let him know [facility], He said he will call back when he would like [place] to put in a referral and that he would request the [address] location

## 2020-10-15 ENCOUNTER — Telehealth: Payer: Self-pay

## 2020-10-15 NOTE — Progress Notes (Signed)
Chronic Care Management Pharmacy Assistant   Name: Larry [name]  MRN: [id number] DOB: 20-Dec-1950  Reason for Encounter:Hypertension Disease State Call.   Recent office visits:  No recent Office Visits  Recent consult visits:  No recent [facility] visits:  None in previous 6 months  Medications: Outpatient Encounter Medications as of 10/15/2020  Medication Sig  . ACCU-CHEK AVIVA PLUS test strip TEST ONCE DAILY (IN THE  MORNING, FASTING)  . Accu-Chek FastClix Lancets MISC   . aspirin 81 MG tablet Take 81 mg by mouth daily.  Marland Kitchen atorvastatin (LIPITOR) 20 MG tablet Take 1 tablet (20 mg total) by mouth 3 (three) times a week.  . baclofen (LIORESAL) 20 MG tablet TAKE 1 TABLET BY MOUTH 3  TIMES DAILY  . Blood Glucose Calibration (ACCU-CHEK AVIVA) SOLN   . cloNIDine (CATAPRES) 0.1 MG tablet Weaning off: 1tab daily x1week, then 1tab EOD x 1week, then stop (Patient not taking: Reported on 07/31/2020)  . ferrous sulfate 325 (65 FE) MG tablet Take 1 tablet (325 mg total) by mouth daily with breakfast. (Patient not taking: Reported on 07/31/2020)  . finasteride (PROSCAR) 5 MG tablet TAKE 1 TABLET BY MOUTH  DAILY  . Incontinence Supply Disposable (DEPEND ADJUSTABLE UNDERWEAR LG) MISC Use as needed.  . Lancets Misc. (ACCU-CHEK FASTCLIX LANCET) KIT 1 Units by Does not apply route daily before breakfast.  . meloxicam (MOBIC) 15 MG tablet Take 1 tablet (15 mg total) by mouth daily as needed. for pain. Please make yearly follow up for continued refills or may ask PCP to manage this med.  . metFORMIN (GLUCOPHAGE XR) 750 MG 24 hr tablet Take 1 tablet (750 mg total) by mouth daily with breakfast.  . mirabegron ER (MYRBETRIQ) 25 MG TB24 tablet Take 1 tablet (25 mg total) by mouth daily.  . Multiple Vitamin (MULTIVITAMIN) tablet Take 1 tablet by mouth daily.  . Olmesartan-amLODIPine-HCTZ 40-10-12.5 MG TABS TAKE 1 TABLET BY MOUTH  DAILY  . pregabalin (LYRICA) 100 MG capsule Take 1 capsule (100  mg total) by mouth 2 (two) times daily.  . traZODone (DESYREL) 50 MG tablet Take 1 tablet (50 mg total) by mouth at bedtime.   No facility-administered encounter medications on file as of 10/15/2020.   Star Rating Drugs: Atorvastatin 20 mg last filled on 09/23/2020 for 90 day supply at [facility]. Metformin 750 mg last filled on 07/23/2020 for 90 day supply at [facility].  Reviewed chart prior to disease state call. Spoke with patient regarding BP  Recent Office Vitals: BP Readings from Last 3 Encounters:  07/31/20 120/76  05/02/20 126/72  02/01/20 122/60   Pulse Readings from Last 3 Encounters:  07/31/20 76  05/02/20 78  02/01/20 80    Wt Readings from Last 3 Encounters:  07/31/20 167 lb (75.8 kg)  05/02/20 155 lb (70.3 kg)  04/09/20 155 lb (70.3 kg)     Kidney Function Lab Results  Component Value Date/Time   CREATININE 1.03 05/03/2020 11:11 AM   CREATININE 0.86 11/01/2019 11:43 AM   CREATININE 1.0 01/04/2014 09:43 AM   CREATININE 0.9 05/22/2013 11:15 AM   GFR 72.97 05/03/2020 11:11 AM   GFRNONAA >60 09/30/2019 02:24 AM   GFRNONAA >89 12/25/2011 11:32 AM   GFRAA >60 09/30/2019 02:24 AM   GFRAA >89 12/25/2011 11:32 AM    BMP Latest Ref Rng & Units 05/03/2020 11/01/2019 09/30/2019  Glucose 70 - 99 mg/dL 133(H) 117(H) 144(H)  BUN 6 - 23 mg/dL 24(H) 15 32(H)  Creatinine 0.40 - 1.50 mg/dL 1.03 0.86 0.88  Sodium 135 - 145 mEq/L 141 140 138  Potassium 3.5 - 5.1 mEq/L 3.9 4.1 4.6  Chloride 96 - 112 mEq/L 102 103 103  CO2 19 - 32 mEq/L '31 29 25  ' Calcium 8.4 - 10.5 mg/dL 9.8 9.6 8.9    . Current antihypertensive regimen:  ? Clonidine 0.1 mg BID  . How often are you checking your Blood Pressure? Patient states he checks his blood pressure once a month. . Current home BP readings:  o Patient reports his blood pressure last month in April was 121/70. Marland Kitchen What recent interventions/DTPs have been made by any provider to improve Blood Pressure control since last CPP  Visit:  o Patient reports he stop taking his olmesartan-Amlodipine-HCTZ 40-10-12.5 mg about a month ago due to swelling in his feet.(patient reports his provider is aware) o Patient states the swelling improved for the first few days and then the swelling is back. . Any recent hospitalizations or ED visits since last visit with CPP? No . What diet changes have been made to improve Blood Pressure Control?  o None ID . What exercise is being done to improve your Blood Pressure Control?  ? Patient states he barely exercise. Patient states he is working on exercising more   Adherence Review: Is the patient currently on ACE/ARB medication? Yes Does the patient have >5 day gap between last estimated fill dates? No   Schedule a telephone appointment follow up with the clinical pharmacist on 11/19/2020 at 11:45 pm.Sent message to scheduler.  [place] Pharmacist Assistant [phone]

## 2020-10-21 DIAGNOSIS — G35 Multiple sclerosis: Secondary | ICD-10-CM | POA: Diagnosis not present

## 2020-10-21 DIAGNOSIS — M4802 Spinal stenosis, cervical region: Secondary | ICD-10-CM | POA: Diagnosis not present

## 2020-10-21 DIAGNOSIS — M4316 Spondylolisthesis, lumbar region: Secondary | ICD-10-CM | POA: Diagnosis not present

## 2020-10-21 DIAGNOSIS — G8220 Paraplegia, unspecified: Secondary | ICD-10-CM | POA: Diagnosis not present

## 2020-10-23 ENCOUNTER — Other Ambulatory Visit: Payer: Self-pay | Admitting: Nurse Practitioner

## 2020-10-23 DIAGNOSIS — I152 Hypertension secondary to endocrine disorders: Secondary | ICD-10-CM

## 2020-10-29 NOTE — Telephone Encounter (Signed)
Pt would like this referral placed. He has worked out his SCAT situation. Please advise pt.

## 2020-10-31 ENCOUNTER — Ambulatory Visit: Payer: Medicare Other | Admitting: Nurse Practitioner

## 2020-11-04 ENCOUNTER — Telehealth: Payer: Self-pay | Admitting: Nurse Practitioner

## 2020-11-04 DIAGNOSIS — G35 Multiple sclerosis: Secondary | ICD-10-CM

## 2020-11-04 NOTE — Telephone Encounter (Signed)
Patient called stating [name] is wanting him to have outpatient PT. He was approved for SCAT transportation and would like referral to be sent to the [facility] office.

## 2020-11-13 ENCOUNTER — Other Ambulatory Visit: Payer: Self-pay

## 2020-11-13 NOTE — Patient Outreach (Signed)
Aging Gracefully Program  11/13/2020  Larry [name] Jan 08, 1951 [id number]  [facility] Evaluation Interviewer attempted to call patient on today regarding Aging Gracefully final evaluation. No answer from patient after multiple rings. CMA left confidential voicemail for patient to return call.  Will attempt to call back within 1 week.   [place] Management Assistant [phone]

## 2020-11-18 ENCOUNTER — Telehealth: Payer: Self-pay

## 2020-11-18 NOTE — Progress Notes (Signed)
  Please Void Note , Open in Error

## 2020-11-18 NOTE — Progress Notes (Signed)
    Chronic Care Management Pharmacy Assistant   Name: Larry [name]  MRN: [id number] DOB: [birth year] 11/18/2020- Patient called to remind of appointment with Junius Argyle, CPP on 11/19/2020 @1145 .  No answer, left message of appointment date, time and type of appointment (either telephone or in person). Left message to have all medications, supplements, blood pressure and/or blood sugar logs available during appointment and to return call if need to reschedule.   Star Rating Drug: 10/17/2020 Metformin 15 mg last filled for a 90-day supply at [facility] 09/23/2020 Atorvastatin 20 mg last filled  or a 90-Day supply at [facility] 08/29/2020  Olmesarta-Amlodipine-HTZ last filled for a 51 Day supply at [facility]  Any gaps in medications fill history? None    [name] Pharmacist Assistant [phone]

## 2020-11-19 ENCOUNTER — Telehealth: Payer: Medicare Other

## 2020-11-19 ENCOUNTER — Other Ambulatory Visit: Payer: Self-pay

## 2020-11-19 NOTE — Patient Outreach (Signed)
Aging Gracefully Program  11/19/2020  Larry [name] 1951-01-31 [id number]  [facility] Evaluation Interviewer made contact with patient. Aging Gracefully final survey completed.   Interviewer will send referral to Sharol Given with [facility] for follow up.   [place] Management Assistant  [phone]

## 2020-11-21 DIAGNOSIS — M4316 Spondylolisthesis, lumbar region: Secondary | ICD-10-CM | POA: Diagnosis not present

## 2020-11-21 DIAGNOSIS — G35 Multiple sclerosis: Secondary | ICD-10-CM | POA: Diagnosis not present

## 2020-11-21 DIAGNOSIS — G8220 Paraplegia, unspecified: Secondary | ICD-10-CM | POA: Diagnosis not present

## 2020-11-21 DIAGNOSIS — M4802 Spinal stenosis, cervical region: Secondary | ICD-10-CM | POA: Diagnosis not present

## 2020-11-27 ENCOUNTER — Other Ambulatory Visit: Payer: Self-pay

## 2020-11-27 ENCOUNTER — Ambulatory Visit: Payer: Medicare Other | Attending: Nurse Practitioner

## 2020-11-27 DIAGNOSIS — R2689 Other abnormalities of gait and mobility: Secondary | ICD-10-CM

## 2020-11-27 DIAGNOSIS — M6281 Muscle weakness (generalized): Secondary | ICD-10-CM

## 2020-11-27 DIAGNOSIS — R293 Abnormal posture: Secondary | ICD-10-CM | POA: Diagnosis not present

## 2020-11-27 NOTE — Therapy (Signed)
Larry [name] [address] [place], [place], [address] Phone: [phone]   Fax:  [phone]  Physical Therapy Evaluation  Patient Details  Name: Larry [name] MRN: [id number] Date of Birth: April 08, 1951 Referring Provider (PT): Wilfred Lacy, NP   Encounter Date: 11/27/2020   PT End of Session - 11/27/20 1536     Visit Number 1    Number of Visits 17    Date for PT Re-Evaluation 01/17/21    Authorization Type UHC Medicare (10th Visit PN)    Progress Note Due on Visit 10    PT Start Time 1446    PT Stop Time 1529    PT Time Calculation (min) 43 min    Equipment Utilized During Treatment Gait belt    Activity Tolerance Patient tolerated treatment well    Behavior During Therapy [facility] for tasks assessed/performed             Past Medical History:  Diagnosis Date   Abnormality of gait    Acute delirium 12/11/2011   Acute respiratory failure due to COVID-19 [facility]) 09/23/2019   CTS (carpal tunnel syndrome) 09/26/2015   Diabetes mellitus    Hypercholesterolemia    Hypertension    Kidney stones    Leukocytopenia 05/22/2013   MS (multiple sclerosis) ([place])    sees [facility]   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    Subjective Assessment - 11/27/20 1449     Subjective Patient reports was diagnosed in [place] (1996), also has history of T10 SCI due to spinal stenosis. Patient reports he has had therapy at home prior and feels like it has been beneficial. Reports he lives alone, daughter lives in [place]. Does have friends and niece that is close by.  Reports that his knees has been buckling (mainly the L knee) when he stands. Reports BLE weakness (Left > Right). Currently using manual w/c as primary  means of mobility. Patient reports he has not walked in approx 6 months. Denies falls.    Pertinent History Carpal Tunnel, DM, Multiple Sclerosis (diagnosed in 1996), Hx of T10 SCI (2013), HTN    Limitations Walking;Standing;House hold activities    Patient Stated Goals Strengthen up the legs    Currently in Pain? No/denies                [facility] PT Assessment - 11/27/20 0001       Assessment   Medical Diagnosis Multiple Sclerosis    Referring Provider (PT) Wilfred Lacy, NP    Onset Date/Surgical Date --   diagnosed with MS in 1996   Prior [name]   Has the patient fallen in the past 6 months No    Has the patient had a decrease in activity level because of a fear of falling?  Yes    Is the patient reluctant to leave their home because of a fear of falling?  No      Home Social worker Private residence    Living Arrangements Alone    Available Help at Discharge Friend(s)    Type of [place]  Home Access Level entry    Home Layout One level    [place] - 2 wheels;Wheelchair - manual;Tub bench;Grab bars - tub/shower;Grab bars - toilet    Additional Comments primarily uses the w/c for mobility      Prior Function   Level of Independence Independent with homemaking with wheelchair    Leisure patient reports independence with ADLs; is currently using meals on wheels. Still report she can cook if needed.      Cognition   Overall Cognitive Status Within Functional Limits for tasks assessed      Sensation   Light Touch Impaired Detail    Hot/Cold --   reports intact   Additional Comments impaired sensation on bottom of feet; hx of diabetes and associated neuropathy      Posture/Postural Control   Posture/Postural Control Postural limitations    Postural Limitations Flexed trunk;Rounded Shoulders;Forward head    Posture Comments crouched posture in standing      Tone    Assessment Location Right Lower Extremity;Left Lower Extremity      ROM / Strength   AROM / PROM / Strength Strength      Strength   Overall Strength Deficits    Overall Strength Comments WNL for BLE's, Normal AROM    Strength Assessment Site Hip;Knee;Ankle    Right/Left Hip Right;Left    Right Hip Flexion 1/5    Right Hip Extension 1/5    Left Hip Flexion 1/5    Left Hip Extension 1/5    Right/Left Knee Right;Left    Right Knee Flexion 1/5    Right Knee Extension 2-/5    Left Knee Flexion 1/5    Left Knee Extension 2-/5    Right/Left Ankle Right;Left    Right Ankle Dorsiflexion 2-/5    Left Ankle Dorsiflexion 2-/5      Bed Mobility   Bed Mobility Rolling Right;Rolling Left;Supine to Sit;Sit to Supine   utilizes arms to move BLE   Rolling Right Independent    Rolling Left Independent    Supine to Sit Independent    Sit to Supine Independent      Transfers   Transfers Sit to Stand;Stand to Sit;Lateral/Scoot Transfers    Sit to Stand 4: Min assist    Sit to Stand Details Tactile cues for posture;Verbal cues for technique;Manual facilitation for weight shifting    Sit to Stand Details (indicate cue type and reason) at counterop, using BUE support from sink: completed sit <> stand x 2 rep with Min A from PT.    Stand to Sit 4: Min assist    Lateral/Scoot Transfers 6: Modified independent (Device/Increase time)    Transfer Cueing completed transfer from w/c <> mat by lateral scoot (no sliding board), Mod I with completion supervision.    Comments After second sit <> stand, patient able to stand for 45 seconds at sink with BUE support and CGA - Min A from PT prior to sitting due to fatigue in BLE reported.      RLE Tone   RLE Tone Mild;Hypertonic      LLE Tone   LLE Tone Mild;Hypertonic             Objective measurements completed on examination: See above findings.               PT Education - 11/27/20 1522     Education Details Educated on  [employer]) Educated Patient    Methods Explanation  Comprehension Verbalized understanding              PT Short Term Goals - 11/27/20 1647       PT SHORT TERM GOAL #1   Title Patient will be independent with initial HEP for ROM/Strength (ALL STGs Due: 12/27/20)    Baseline no HEP established    Time 4    Period Weeks    Status New    Target Date 12/27/20      PT SHORT TERM GOAL #2   Title Patient will be able to stand with BUE support at sink or with RW for >/= 1 minute, with CGA for improved tolerance for functional activities    Baseline 45 seconds    Time 4    Period Weeks               PT Long Term Goals - 11/27/20 1651       PT LONG TERM GOAL #1   Title Patient will be independent with Final HEP for strengthening/ROM/balance (ALL LTGs:    Baseline no HEP established    Time 8    Period Weeks    Status New    Target Date 01/17/21      PT LONG TERM GOAL #2   Title Patient will be able to stand with BUE support at countertop or RW for >/= 3 minutes for improved standing tolerance with functional tasks    Baseline 45 seconds    Time 8    Period Weeks    Status New      PT LONG TERM GOAL #3   Title Patient will be able to ambulate x 10 ft with RW and Min A from PT for short household distances    Baseline TBA    Time 8    Period Weeks    Status New      PT LONG TERM GOAL #4   Title Patient will undergo further w/c assesment to determine need for power mobility    Baseline TBA    Time 8    Period Weeks    Status New                    Plan - 11/27/20 1640     Clinical Impression Statement Patient is a 70 y.o. male referred to Neuro OPPT services for Multiple Sclerosis. Patient's PMH Significant for the following: Carpal Tunnel, DM, Multiple Sclerosis, Hx of T10 SCI (2013), HTN. Patient primary means of mobility is by manual w/c at this time. Patient presents with the following impairments: decreased functional  mobility, decreased strength, impaired sensation, abnormal posture, abnormal tone, abnormal ROM, decreased standing balance, and decreased activity tolerance/endurance. Patient currently Mod I with bed mobility and lateral scoot transfers, however requires Min A with sit <> stand. Patient will benefit from skilled PT services to address impairments and maximize functional mobility.    Personal Factors and Comorbidities Comorbidity 3+;Time since onset of injury/illness/exacerbation;Age    Comorbidities Carpal Tunnel, DM, Multiple Sclerosis, Hx of T10 SCI (2013), HTN    Examination-Activity Limitations Bed Mobility;Locomotion Level;Reach Overhead;Transfers;Toileting;Stand;Bathing;Sit;Dressing;Hygiene/Grooming    Examination-Participation Restrictions Interpersonal Relationship;Community Activity;Cleaning    Stability/Clinical Decision Making Stable/Uncomplicated    Clinical Decision Making Low    Rehab Potential Good    PT Frequency 2x / week    PT Duration 8 weeks    PT Treatment/Interventions ADLs/Self Care Home Management;Moist Heat;Cryotherapy;DME Instruction;Gait training;Stair training;Functional mobility training;Therapeutic activities;Therapeutic exercise;Balance training;Neuromuscular re-education;Patient/family education;Orthotic Fit/Training;Wheelchair mobility training;Manual techniques;Vestibular;Dry  needling;Passive range of motion    PT Next Visit Plan Initiate HEP focused on supine strengthening, stretching (exercise he can do alone and potential for ones to compelte with niece?). Try Standing Frame    Consulted and Agree with Plan of Care Patient             Patient will benefit from skilled therapeutic intervention in order to improve the following deficits and impairments:  Impaired tone, Postural dysfunction, Decreased mobility, Decreased activity tolerance, Decreased endurance, Decreased range of motion, Decreased strength, Impaired flexibility, Difficulty walking, Decreased  balance, Abnormal gait, Impaired sensation  Visit Diagnosis: Abnormal posture  Muscle weakness (generalized)  Other abnormalities of gait and mobility     Problem List Patient Active Problem List   Diagnosis Date Noted   Bilateral leg edema 02/09/2020   Elevated LDL cholesterol level 11/09/2019   Multiple subsegmental pulmonary emboli without acute cor pulmonale ([place]) 11/01/2019   Pressure injury of skin 09/25/2019   Benign prostatic hyperplasia with lower urinary tract symptoms 09/14/2019   Cervical stenosis of spinal canal 05/09/2019   Thoracic spinal stenosis 05/09/2019   Gait abnormality 08/08/2018   Spinal stenosis 08/08/2018   Iron deficiency anemia 02/09/2018   Bowel incontinence 01/01/2017   Vitamin D deficiency 01/11/2016   GERD (gastroesophageal reflux disease) 01/11/2016   Spinal stenosis, lumbar region, with neurogenic claudication 01/10/2016   Spinal stenosis of lumbar region with radiculopathy 01/10/2016   Elective surgery    Post-operative pain    Tachycardia    Peripheral neuropathy    Spondylolisthesis of lumbar region 12/30/2015   CTS (carpal tunnel syndrome) 09/26/2015   Lumbar stenosis 09/26/2015   Weakness 09/05/2015   Spinal stenosis in cervical region 06/12/2015   Leukocytopenia 05/22/2013   History of spinal cord injury 12/12/2011   Paraplegia ([place]) 12/11/2011   Altered mental status 12/11/2011   DM (diabetes mellitus) ([place]) 12/11/2011   Overactive bladder 12/11/2011   MS (multiple sclerosis) ([place])    Hypertension     Jones Bales, PT, DPT 11/27/2020, 4:54 PM  [facility] [address] [place], [place], [address] Phone: [phone]   Fax:  [phone]  Name: Larry [name] MRN: [id number] Date of Birth: February 01, 1951

## 2020-12-03 ENCOUNTER — Telehealth: Payer: Self-pay

## 2020-12-03 NOTE — Progress Notes (Signed)
Chronic Care Management Pharmacy Assistant   Name: Wesly Whisenant  MRN: [id number] DOB: 06-11-1950  Reason for Encounter:Diabetes Disease State Call.   Recent office visits:  No recent Office Visit  Recent consult visits:  11/27/2020 [facility] (Rehabilitation)  Hospital visits:  None in previous 6 months  Medications: Outpatient Encounter Medications as of 12/03/2020  Medication Sig   ACCU-CHEK AVIVA PLUS test strip TEST ONCE DAILY (IN THE  MORNING, FASTING)   Accu-Chek FastClix Lancets MISC    aspirin 81 MG tablet Take 81 mg by mouth daily.   atorvastatin (LIPITOR) 20 MG tablet Take 1 tablet (20 mg total) by mouth 3 (three) times a week.   baclofen (LIORESAL) 20 MG tablet TAKE 1 TABLET BY MOUTH 3  TIMES DAILY   Blood Glucose Calibration (ACCU-CHEK AVIVA) SOLN    cloNIDine (CATAPRES) 0.1 MG tablet Weaning off: 1tab daily x1week, then 1tab EOD x 1week, then stop   ferrous sulfate 325 (65 FE) MG tablet Take 1 tablet (325 mg total) by mouth daily with breakfast.   finasteride (PROSCAR) 5 MG tablet TAKE 1 TABLET BY MOUTH  DAILY   Incontinence Supply Disposable (DEPEND ADJUSTABLE UNDERWEAR LG) MISC Use as needed.   Lancets Misc. (ACCU-CHEK FASTCLIX LANCET) KIT 1 Units by Does not apply route daily before breakfast.   meloxicam (MOBIC) 15 MG tablet Take 1 tablet (15 mg total) by mouth daily as needed. for pain. Please make yearly follow up for continued refills or may ask PCP to manage this med.   metFORMIN (GLUCOPHAGE XR) 750 MG 24 hr tablet Take 1 tablet (750 mg total) by mouth daily with breakfast.   mirabegron ER (MYRBETRIQ) 25 MG TB24 tablet Take 1 tablet (25 mg total) by mouth daily.   Multiple Vitamin (MULTIVITAMIN) tablet Take 1 tablet by mouth daily.   Olmesartan-amLODIPine-HCTZ 40-10-12.5 MG TABS TAKE 1 TABLET BY MOUTH  DAILY   pregabalin (LYRICA) 100 MG capsule Take 1 capsule (100 mg total) by mouth 2 (two) times daily.   traZODone (DESYREL) 50 MG tablet Take 1  tablet (50 mg total) by mouth at bedtime.   No facility-administered encounter medications on file as of 12/03/2020.    Care Gaps: Per patient chart, Patient is due for the following care gaps: Tetanus vaccine,Covid vaccine and A1C. Star Rating Drugs: Atorvastatin 20 mg last filled on 09/23/2020 for 90 day supply at [facility]. Metformin 750 mg last filled on 10/17/2020 for 90 day supply at [facility]. Olmesartan-Amlodipine-HCTZ 40-10-12.5 mg last filled on 11/27/2020 for 90 day supply at [place]: Lab Results  Component Value Date/Time   HGBA1C 6.0 (A) 05/02/2020 03:09 PM   HGBA1C 6.1 11/01/2019 11:43 AM   HGBA1C 5.9 03/24/2019 03:55 PM   MICROALBUR 4.7 (H) 07/27/2016 12:57 PM    Kidney Function Lab Results  Component Value Date/Time   CREATININE 1.03 05/03/2020 11:11 AM   CREATININE 0.86 11/01/2019 11:43 AM   CREATININE 1.0 01/04/2014 09:43 AM   CREATININE 0.9 05/22/2013 11:15 AM   GFR 72.97 05/03/2020 11:11 AM   GFRNONAA >60 09/30/2019 02:24 AM   GFRNONAA >89 12/25/2011 11:32 AM   GFRAA >60 09/30/2019 02:24 AM   GFRAA >89 12/25/2011 11:32 AM    Current antihyperglycemic regimen:  Metformin 750 mg XR Daily What recent interventions/DTPs have been made to improve glycemic control:  None ID Have there been any recent hospitalizations or ED visits since last visit with CPP? No Patient denies hypoglycemic symptoms, including  Pale, Sweaty, Shaky, Hungry, Nervous/irritable, and Vision changes Patient denies hyperglycemic symptoms, including blurry vision, excessive thirst, fatigue, polyuria, and weakness How often are you checking your blood sugar?  Patient states he checks his blood sugar once every 3 weeks. What are your blood sugars ranging?  Before meals:  Patient states his blood sugar was 116 the last time he check it.  During the week, how often does your blood glucose drop below 70? Never Are you checking your feet daily/regularly?    Patient denies numbness,pain or tingling sensation in his feet.  Adherence Review: Is the patient currently on a STATIN medication? Yes Is the patient currently on ACE/ARB medication? Yes Does the patient have >5 day gap between last estimated fill dates? No  Patient schedule a telephone follow up with the clinical pharmacist on 01/13/2021 at 10:00 am.Sent Message to scheduler.  [place] Pharmacist Assistant [phone]

## 2020-12-11 ENCOUNTER — Other Ambulatory Visit: Payer: Self-pay | Admitting: Nurse Practitioner

## 2020-12-11 ENCOUNTER — Ambulatory Visit: Payer: Medicare Other | Attending: Nurse Practitioner | Admitting: Physical Therapy

## 2020-12-11 ENCOUNTER — Other Ambulatory Visit: Payer: Self-pay

## 2020-12-11 DIAGNOSIS — M6281 Muscle weakness (generalized): Secondary | ICD-10-CM | POA: Diagnosis not present

## 2020-12-11 DIAGNOSIS — R293 Abnormal posture: Secondary | ICD-10-CM | POA: Diagnosis not present

## 2020-12-11 DIAGNOSIS — E118 Type 2 diabetes mellitus with unspecified complications: Secondary | ICD-10-CM

## 2020-12-11 DIAGNOSIS — R2689 Other abnormalities of gait and mobility: Secondary | ICD-10-CM | POA: Diagnosis not present

## 2020-12-11 NOTE — Therapy (Signed)
[facility] [address] [place], [place], [address] Phone: [phone]   Fax:  [phone]  Physical Therapy Treatment  Patient Details  Name: Larry [name] MRN: [id number] Date of Birth: 1951/04/06 Referring Provider (PT): Wilfred Lacy, NP   Encounter Date: 12/11/2020   PT End of Session - 12/11/20 1537     Visit Number 2    Number of Visits 17    Date for PT Re-Evaluation 01/17/21    Authorization Type U[employer] Medicare (10th Visit PN)    Progress Note Due on Visit 10    PT Start Time 1538    PT Stop Time 1620    PT Time Calculation (min) 42 min    Equipment Utilized During Treatment Gait belt    Activity Tolerance Patient tolerated treatment well    Behavior During Therapy [facility] for tasks assessed/performed             Past Medical History:  Diagnosis Date   Abnormality of gait    Acute delirium 12/11/2011   Acute respiratory failure due to COVID-19 [facility]) 09/23/2019   CTS (carpal tunnel syndrome) 09/26/2015   Diabetes mellitus    Hypercholesterolemia    Hypertension    Kidney stones    Leukocytopenia 05/22/2013   MS (multiple sclerosis) ([place])    sees [facility]   T10 spinal cord injury ([place]) 12/12/2011   hx surgery 1996 with bilat gait abnormality since    Past Surgical History:  Procedure Laterality Date   BACK SURGERY     COLONOSCOPY     LUMBAR LAMINECTOMY      There were no vitals filed for this visit.                      [place] [facility] Adult PT Treatment/Exercise - 12/11/20 0001       Bed Mobility   Rolling Right Contact Guard/Touching assist    Rolling Left Independent    Supine to Sit Independent    Sit to Supine Independent      Transfers   Sit to Stand 4: Min guard   at countertop   Sit to Stand Details (indicate cue type and reason) Cues for foot placement    Stand to Sit 4: Min guard    Lateral/Scoot Transfers 6: Modified independent (Device/Increase time)       Exercises   Exercises Knee/Hip      Knee/Hip Exercises: Stretches   Hip Flexor Stretch Both;20 seconds   prone     Knee/Hip Exercises: Standing   Other Standing Knee Exercises glute squeeze/hip extension at counter x10   cues to maintain knee straight/quad activation     Knee/Hip Exercises: Seated   Long Arc Quad Right;Left;2 sets;10 reps      Knee/Hip Exercises: Supine   Hip Adduction Isometric Strengthening;Both;10 reps    Other Supine Knee/Hip Exercises Clamshell 2x10    Other Supine Knee/Hip Exercises Heel slide x10; eccentric marching x10      Knee/Hip Exercises: Sidelying   Other Sidelying Knee/Hip Exercises AAROM hip extension x10 bilat      Knee/Hip Exercises: Prone   Other Prone Exercises forearm/knee plank x20 sec                      PT Short Term Goals - 11/27/20 1647       PT SHORT TERM GOAL #1   Title Patient will be independent with initial HEP for ROM/Strength (ALL STGs Due: 12/27/20)  Baseline no HEP established    Time 4    Period Weeks    Status New    Target Date 12/27/20      PT SHORT TERM GOAL #2   Title Patient will be able to stand with BUE support at sink or with RW for >/= 1 minute, with CGA for improved tolerance for functional activities    Baseline 45 seconds    Time 4    Period Weeks               PT Long Term Goals - 11/27/20 1651       PT LONG TERM GOAL #1   Title Patient will be independent with Final HEP for strengthening/ROM/balance (ALL LTGs:    Baseline no HEP established    Time 8    Period Weeks    Status New    Target Date 01/17/21      PT LONG TERM GOAL #2   Title Patient will be able to stand with BUE support at countertop or RW for >/= 3 minutes for improved standing tolerance with functional tasks    Baseline 45 seconds    Time 8    Period Weeks    Status New      PT LONG TERM GOAL #3   Title Patient will be able to ambulate x 10 ft with RW and Min A from PT for short household distances     Baseline TBA    Time 8    Period Weeks    Status New      PT LONG TERM GOAL #4   Title Patient will undergo further w/c assesment to determine need for power mobility    Baseline TBA    Time 8    Period Weeks    Status New                   Plan - 12/11/20 1630     Clinical Impression Statement Treatment focused on initiating strengthening exercises in bed for pt's HEP. Provided 1 standing exercise; however, pt is limited due to tight hip flexors. Due to increased time for transfers, did not have time to attempt standing frame. Will be good to attempt next visit.    Personal Factors and Comorbidities Comorbidity 3+;Time since onset of injury/illness/exacerbation;Age    Comorbidities Carpal Tunnel, DM, Multiple Sclerosis, Hx of T10 SCI (2013), HTN    Examination-Activity Limitations Bed Mobility;Locomotion Level;Reach Overhead;Transfers;Toileting;Stand;Bathing;Sit;Dressing;Hygiene/Grooming    Examination-Participation Restrictions Interpersonal Relationship;Community Activity;Cleaning    Stability/Clinical Decision Making Stable/Uncomplicated    Rehab Potential Good    PT Frequency 2x / week    PT Duration 8 weeks    PT Treatment/Interventions ADLs/Self Care Home Management;Moist Heat;Cryotherapy;DME Instruction;Gait training;Stair training;Functional mobility training;Therapeutic activities;Therapeutic exercise;Balance training;Neuromuscular re-education;Patient/family education;Orthotic Fit/Training;Wheelchair mobility training;Manual techniques;Vestibular;Dry needling;Passive range of motion    PT Next Visit Plan [place] to progress HEP focused on supine/sitting strengthening (consider eccentrics as pt is too weak for concentric muscle activation and can perform some isometrics), stretching (exercise he can do alone and potential for ones to compelte with niece?). Try Standing Frame    PT Home Exercise Plan Access Code: [email or web address]    Consulted and Agree with Plan of Care  Patient             Patient will benefit from skilled therapeutic intervention in order to improve the following deficits and impairments:  Impaired tone, Postural dysfunction, Decreased mobility, Decreased activity tolerance, Decreased endurance, Decreased range of motion,  Decreased strength, Impaired flexibility, Difficulty walking, Decreased balance, Abnormal gait, Impaired sensation  Visit Diagnosis: Abnormal posture  Muscle weakness (generalized)  Other abnormalities of gait and mobility     Problem List Patient Active Problem List   Diagnosis Date Noted   Bilateral leg edema 02/09/2020   Elevated LDL cholesterol level 11/09/2019   Multiple subsegmental pulmonary emboli without acute cor pulmonale (HCC) 11/01/2019   Pressure injury of skin 09/25/2019   Benign prostatic hyperplasia with lower urinary tract symptoms 09/14/2019   Cervical stenosis of spinal canal 05/09/2019   Thoracic spinal stenosis 05/09/2019   Gait abnormality 08/08/2018   Spinal stenosis 08/08/2018   Iron deficiency anemia 02/09/2018   Bowel incontinence 01/01/2017   Vitamin D deficiency 01/11/2016   GERD (gastroesophageal reflux disease) 01/11/2016   Spinal stenosis, lumbar region, with neurogenic claudication 01/10/2016   Spinal stenosis of lumbar region with radiculopathy 01/10/2016   Elective surgery    Post-operative pain    Tachycardia    Peripheral neuropathy    Spondylolisthesis of lumbar region 12/30/2015   CTS (carpal tunnel syndrome) 09/26/2015   Lumbar stenosis 09/26/2015   Weakness 09/05/2015   Spinal stenosis in cervical region 06/12/2015   Leukocytopenia 05/22/2013   History of spinal cord injury 12/12/2011   Paraplegia ([place]) 12/11/2011   Altered mental status 12/11/2011   DM (diabetes mellitus) ([place]) 12/11/2011   Overactive bladder 12/11/2011   MS (multiple sclerosis) [facility])    Hypertension     Cloyce Paterson April Ma L Trexton Escamilla PT, DPT 12/11/2020, 4:35 PM  [facility] [address] [place], [place], [address] Phone: [phone]   Fax:  [phone]  Name: Markelle Najarian MRN: [id number] Date of Birth: 1950-07-09

## 2020-12-12 NOTE — Telephone Encounter (Signed)
Chart supports Rx 

## 2020-12-13 ENCOUNTER — Ambulatory Visit: Payer: Medicare Other

## 2020-12-13 ENCOUNTER — Other Ambulatory Visit: Payer: Self-pay

## 2020-12-13 DIAGNOSIS — R293 Abnormal posture: Secondary | ICD-10-CM

## 2020-12-13 DIAGNOSIS — M6281 Muscle weakness (generalized): Secondary | ICD-10-CM

## 2020-12-13 DIAGNOSIS — R2689 Other abnormalities of gait and mobility: Secondary | ICD-10-CM

## 2020-12-13 NOTE — Therapy (Signed)
[facility] [address] [place], [place], [address] Phone: [phone]   Fax:  [phone]  Physical Therapy Treatment  Patient Details  Name: Larry [name] MRN: [id number] Date of Birth: [birth year] Referring Provider (PT): Wilfred Lacy, NP   Encounter Date: 12/13/2020   PT End of Session - 12/13/20 1406     Visit Number 3    Number of Visits 17    Date for PT Re-Evaluation 01/17/21    Authorization Type UHC Medicare (10th Visit PN)    Progress Note Due on Visit 10    PT Start Time 1404    PT Stop Time 1442   patient needing to use bathroom at end of session; ended early   PT Time Calculation (min) 38 min    Equipment Utilized During Treatment Gait belt    Activity Tolerance Patient tolerated treatment well    Behavior During Therapy [facility] for tasks assessed/performed             Past Medical History:  Diagnosis Date   Abnormality of gait    Acute delirium 12/11/2011   Acute respiratory failure due to COVID-19 [facility]) 09/23/2019   CTS (carpal tunnel syndrome) 09/26/2015   Diabetes mellitus    Hypercholesterolemia    Hypertension    Kidney stones    Leukocytopenia 05/22/2013   MS (multiple sclerosis) ([place])    sees [facility]   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   Subjective Assessment - 12/13/20 1406     Subjective Patient denies any new changes/complaints. No falls or pain to report today. Patient reports that exercises are going well, would like to stretch    Pertinent History Carpal Tunnel, DM, Multiple Sclerosis (diagnosed in 1996), Hx of T10 SCI (2013), HTN    Limitations Walking;Standing;House hold activities    Patient Stated Goals Strengthen up the legs    Currently in  Pain? No/denies                [facility] Adult PT Treatment/Exercise - 12/13/20 0001       Transfers   Transfers Sit to Stand;Stand to Sit;Lateral/Scoot Transfers    Sit to Stand 4: Min guard;From chair/3-in-1;With upper extremity assist   from stedy   Sit to Stand Details (indicate cue type and reason) cues for foot placement    Stand to Sit 4: Min guard;With upper extremity assist   from stedy   Lateral/Scoot Transfers 6: Modified independent (Device/Increase time)    Comments patient completed transfer from w/c <> mat Mod I with lateral scoot.      Neuro Re-ed    Neuro Re-ed Details  in STEDY: completed standing with BUE support to work on improved standing tolerance, 1st rep for 1 min 12 secs, 2nd rep 1 min 7 seconds ; completed sit <> stands from stedy seat x 10 reps, completed 2 sets with intermittent rest break between sets.      Exercises   Exercises Other Exercises    Other Exercises  Completed supine stretching routine and added to HEP. PT edcuaitng how to do with niece and/or do independently.             Completed the following stretches during  session and added to HEP on 7/15. PT educating how to complete independently as well as with assistance from family member.  Access Code: [email or web address] URL: [email or web address] Date: 12/13/2020 Prepared by: Baldomero Lamy  Exercises Supine Hamstring Stretch with Strap - 1 x daily - 7 x weekly - 1 sets - 3 reps - 30 seconds hold Supine Single Knee to Chest Stretch - 1 x daily - 7 x weekly - 1 sets - 3 reps - 30 seconds hold Supine Lower Trunk Rotation - 1 x daily - 7 x weekly - 1 sets - 5 reps - 15 seconds hold      PT Education - 12/13/20 1420     Education Details HEP Additions    Person(s) Educated Patient    Methods Explanation;Demonstration;Handout    Comprehension Verbalized understanding;Returned demonstration              PT Short Term Goals - 11/27/20 1647       PT SHORT TERM GOAL #1    Title Patient will be independent with initial HEP for ROM/Strength (ALL STGs Due: 12/27/20)    Baseline no HEP established    Time 4    Period Weeks    Status New    Target Date 12/27/20      PT SHORT TERM GOAL #2   Title Patient will be able to stand with BUE support at sink or with RW for >/= 1 minute, with CGA for improved tolerance for functional activities    Baseline 45 seconds    Time 4    Period Weeks               PT Long Term Goals - 11/27/20 1651       PT LONG TERM GOAL #1   Title Patient will be independent with Final HEP for strengthening/ROM/balance (ALL LTGs:    Baseline no HEP established    Time 8    Period Weeks    Status New    Target Date 01/17/21      PT LONG TERM GOAL #2   Title Patient will be able to stand with BUE support at countertop or RW for >/= 3 minutes for improved standing tolerance with functional tasks    Baseline 45 seconds    Time 8    Period Weeks    Status New      PT LONG TERM GOAL #3   Title Patient will be able to ambulate x 10 ft with RW and Min A from PT for short household distances    Baseline TBA    Time 8    Period Weeks    Status New      PT LONG TERM GOAL #4   Title Patient will undergo further w/c assesment to determine need for power mobility    Baseline TBA    Time 8    Period Weeks    Status New                   Plan - 12/13/20 1434     Clinical Impression Statement Today's skilled PT session focused on BLE stretching and adding exercises to promtoe stretching at home to HEP. Then continued NMR and stadning tolerance activities in STEDY with patient tolerating well. Able to stand >/= 1 minute today with BUE support and CGA from PT. Will continue to progress toward all LTGs.    Personal Factors and Comorbidities Comorbidity 3+;Time since onset of injury/illness/exacerbation;Age    Comorbidities Carpal  Tunnel, DM, Multiple Sclerosis, Hx of T10 SCI (2013), HTN    Examination-Activity Limitations  Bed Mobility;Locomotion Level;Reach Overhead;Transfers;Toileting;Stand;Bathing;Sit;Dressing;Hygiene/Grooming    Examination-Participation Restrictions Interpersonal Relationship;Community Activity;Cleaning    Stability/Clinical Decision Making Stable/Uncomplicated    Rehab Potential Good    PT Frequency 2x / week    PT Duration 8 weeks    PT Treatment/Interventions ADLs/Self Care Home Management;Moist Heat;Cryotherapy;DME Instruction;Gait training;Stair training;Functional mobility training;Therapeutic activities;Therapeutic exercise;Balance training;Neuromuscular re-education;Patient/family education;Orthotic Fit/Training;Wheelchair mobility training;Manual techniques;Vestibular;Dry needling;Passive range of motion    PT Next Visit Plan [place] to progress HEP focused on supine/sitting strengthening (consider eccentrics as pt is too weak for concentric muscle activation and can perform some isometrics), stretching (exercise he can do alone and potential for ones to compelte with niece?). Try Standing Frame    PT Home Exercise Plan Access Code: [email or web address]    Consulted and Agree with Plan of Care Patient             Patient will benefit from skilled therapeutic intervention in order to improve the following deficits and impairments:  Impaired tone, Postural dysfunction, Decreased mobility, Decreased activity tolerance, Decreased endurance, Decreased range of motion, Decreased strength, Impaired flexibility, Difficulty walking, Decreased balance, Abnormal gait, Impaired sensation  Visit Diagnosis: Abnormal posture  Muscle weakness (generalized)  Other abnormalities of gait and mobility     Problem List Patient Active Problem List   Diagnosis Date Noted   Bilateral leg edema 02/09/2020   Elevated LDL cholesterol level 11/09/2019   Multiple subsegmental pulmonary emboli without acute cor pulmonale (HCC) 11/01/2019   Pressure injury of skin 09/25/2019   Benign prostatic hyperplasia  with lower urinary tract symptoms 09/14/2019   Cervical stenosis of spinal canal 05/09/2019   Thoracic spinal stenosis 05/09/2019   Gait abnormality 08/08/2018   Spinal stenosis 08/08/2018   Iron deficiency anemia 02/09/2018   Bowel incontinence 01/01/2017   Vitamin D deficiency 01/11/2016   GERD (gastroesophageal reflux disease) 01/11/2016   Spinal stenosis, lumbar region, with neurogenic claudication 01/10/2016   Spinal stenosis of lumbar region with radiculopathy 01/10/2016   Elective surgery    Post-operative pain    Tachycardia    Peripheral neuropathy    Spondylolisthesis of lumbar region 12/30/2015   CTS (carpal tunnel syndrome) 09/26/2015   Lumbar stenosis 09/26/2015   Weakness 09/05/2015   Spinal stenosis in cervical region 06/12/2015   Leukocytopenia 05/22/2013   History of spinal cord injury 12/12/2011   Paraplegia ([place]) 12/11/2011   Altered mental status 12/11/2011   DM (diabetes mellitus) ([place]) 12/11/2011   Overactive bladder 12/11/2011   MS (multiple sclerosis) ([place])    Hypertension     Jones Bales, PT, DPT 12/13/2020, 4:07 PM  [facility] [address] [place], [place], [address] Phone: [phone]   Fax:  [phone]  Name: Larry [name] MRN: [id number] Date of Birth: August 21, 1950

## 2020-12-13 NOTE — Patient Instructions (Signed)
Access Code: [email or web address] URL: [email or web address] Date: 12/13/2020 Prepared by: Baldomero Lamy  Exercises Bent Knee Fallouts - 1 x daily - 7 x weekly - 2 sets - 10 reps Supine Hip Adduction Isometric with Ball - 1 x daily - 7 x weekly - 2 sets - 10 reps Seated Long Arc Quad - 1 x daily - 7 x weekly - 2 sets - 10 reps Supine March - 1 x daily - 7 x weekly - 3 sets - 10 reps Standing Gluteal Sets - 1 x daily - 7 x weekly - 2 sets - 10 reps Supine Hamstring Stretch with Strap - 1 x daily - 7 x weekly - 1 sets - 3 reps - 30 seconds hold Supine Single Knee to Chest Stretch - 1 x daily - 7 x weekly - 1 sets - 3 reps - 30 seconds hold Supine Lower Trunk Rotation - 1 x daily - 7 x weekly - 1 sets - 5 reps - 15 seconds hold

## 2020-12-18 ENCOUNTER — Other Ambulatory Visit: Payer: Self-pay

## 2020-12-18 ENCOUNTER — Ambulatory Visit: Payer: Medicare Other | Admitting: Physical Therapy

## 2020-12-18 ENCOUNTER — Encounter: Payer: Self-pay | Admitting: Physical Therapy

## 2020-12-18 DIAGNOSIS — M6281 Muscle weakness (generalized): Secondary | ICD-10-CM | POA: Diagnosis not present

## 2020-12-18 DIAGNOSIS — R2689 Other abnormalities of gait and mobility: Secondary | ICD-10-CM | POA: Diagnosis not present

## 2020-12-18 DIAGNOSIS — R293 Abnormal posture: Secondary | ICD-10-CM | POA: Diagnosis not present

## 2020-12-18 NOTE — Therapy (Signed)
[facility] [address] [place], [place], [address] Phone: [phone]   Fax:  [phone]  Physical Therapy Treatment  Patient Details  Name: Larry [name] MRN: [id number] Date of Birth: 1951/01/27 Referring Provider (PT): Wilfred Lacy, NP   Encounter Date: 12/18/2020   PT End of Session - 12/18/20 1452     Visit Number 4    Number of Visits 17    Date for PT Re-Evaluation 01/17/21    Authorization Type U[employer] Medicare (10th Visit PN)    Progress Note Due on Visit 10    PT Start Time 1456   pt in bathroom prior to session   PT Stop Time 1530    PT Time Calculation (min) 34 min    Equipment Utilized During Treatment Gait belt    Activity Tolerance Patient tolerated treatment well    Behavior During Therapy [facility] for tasks assessed/performed             Past Medical History:  Diagnosis Date   Abnormality of gait    Acute delirium 12/11/2011   Acute respiratory failure due to COVID-19 [facility]) 09/23/2019   CTS (carpal tunnel syndrome) 09/26/2015   Diabetes mellitus    Hypercholesterolemia    Hypertension    Kidney stones    Leukocytopenia 05/22/2013   MS (multiple sclerosis) ([place])    sees [facility]   T10 spinal cord injury ([place]) 12/12/2011   hx surgery 1996 with bilat gait abnormality since    Past Surgical History:  Procedure Laterality Date   BACK SURGERY     COLONOSCOPY     LUMBAR LAMINECTOMY      There were no vitals filed for this visit.   Subjective Assessment - 12/18/20 1451     Subjective No new complaints. No falls or pain to report. Has been doing the stretches mostly at night to help with heavy feeling in LE's.    Pertinent History Carpal Tunnel, DM, Multiple Sclerosis (diagnosed in 1996), Hx of T10 SCI (2013), HTN    Limitations Walking;Standing;House hold activities    Patient Stated Goals Strengthen up the legs    Currently in Pain? No/denies                      [facility] Adult PT Treatment/Exercise - 12/18/20 1458       Transfers   Transfers Sit to Stand;Stand to Sit;Lateral/Scoot Transfers    Sit to Stand 4: Min assist;4: Min guard;With upper extremity assist;From bed;Other (comment)   from [facility] lift pads   Sit to Stand Details Tactile cues for posture;Verbal cues for technique;Manual facilitation for weight shifting    Sit to Stand Details (indicate cue type and reason) cues/assist for foot placement on Stedy. min assist from low mat to stand in [place]. Min guard assist for standing from lift pads.    Stand to Sit 4: Min guard;4: Min assist;With upper extremity assist;To bed;Other (comment)   to lift pads of Stedy   Stand to Sit Details (indicate cue type and reason) Verbal cues for sequencing;Verbal cues for technique;Verbal cues for safe use of DME/AE;Manual facilitation for weight shifting    Stand to Sit Details min guard assist to sit to lift pads with uncontrolled descent, min assist for sitting to mat table with controlled descent.    Lateral/Scoot Transfers 5: Supervision    Lateral/Scoot Transfer Details (indicate cue type and reason) wheelchair<>mat table with cues for safety with transfer, to remove arm rest to  ease transfer    Comments standing in Stedy lift assist- see TA for full comments      Therapeutic Activites    Therapeutic Activities Other Therapeutic Activities    Other Therapeutic Activities working on standing posture and tolerance with 1st stand for 1 minute 28.75 sec's, 2cd stand 2 minutes 29.75 sec's. then working on strengthening in [place]- knee presses pushing knees into extension sliding buttocks up on lift pads/back down to knees resting on knee pad for 10 reps with guarding at knees. then sit<>stands from lift pads with emphasis on knee/hip extension with guarding at knees      Exercises   Exercises Other Exercises    Other Exercises  supine on mat table: passive hamstring and hip adductor stretching for 1-2 minutes each.  Assist needed to stabilize other leg in position with adductor stretching; prone on mat table: press ups for hip flexor and trunk extensor stretching for 10 reps, then passive quad stretching for 1-2 minutes each side.                      PT Short Term Goals - 11/27/20 1647       PT SHORT TERM GOAL #1   Title Patient will be independent with initial HEP for ROM/Strength (ALL STGs Due: 12/27/20)    Baseline no HEP established    Time 4    Period Weeks    Status New    Target Date 12/27/20      PT SHORT TERM GOAL #2   Title Patient will be able to stand with BUE support at sink or with RW for >/= 1 minute, with CGA for improved tolerance for functional activities    Baseline 45 seconds    Time 4    Period Weeks               PT Long Term Goals - 11/27/20 1651       PT LONG TERM GOAL #1   Title Patient will be independent with Final HEP for strengthening/ROM/balance (ALL LTGs:    Baseline no HEP established    Time 8    Period Weeks    Status New    Target Date 01/17/21      PT LONG TERM GOAL #2   Title Patient will be able to stand with BUE support at countertop or RW for >/= 3 minutes for improved standing tolerance with functional tasks    Baseline 45 seconds    Time 8    Period Weeks    Status New      PT LONG TERM GOAL #3   Title Patient will be able to ambulate x 10 ft with RW and Min A from PT for short household distances    Baseline TBA    Time 8    Period Weeks    Status New      PT LONG TERM GOAL #4   Title Patient will undergo further w/c assesment to determine need for power mobility    Baseline TBA    Time 8    Period Weeks    Status New                   Plan - 12/18/20 1457     Clinical Impression Statement Today's skilled session continued to focus on stretching of LE's initially for improved standing in [place]. Remainder of session focused on standing and strengthening with use of Stedy lift. Pt able to  increase his  consecutive standing time in the [place] this session. The pt is making progress toward goals and should benefit from continued PT to progress toward unmet goals.    Personal Factors and Comorbidities Comorbidity 3+;Time since onset of injury/illness/exacerbation;Age    Comorbidities Carpal Tunnel, DM, Multiple Sclerosis, Hx of T10 SCI (2013), HTN    Examination-Activity Limitations Bed Mobility;Locomotion Level;Reach Overhead;Transfers;Toileting;Stand;Bathing;Sit;Dressing;Hygiene/Grooming    Examination-Participation Restrictions Interpersonal Relationship;Community Activity;Cleaning    Stability/Clinical Decision Making Stable/Uncomplicated    Rehab Potential Good    PT Frequency 2x / week    PT Duration 8 weeks    PT Treatment/Interventions ADLs/Self Care Home Management;Moist Heat;Cryotherapy;DME Instruction;Gait training;Stair training;Functional mobility training;Therapeutic activities;Therapeutic exercise;Balance training;Neuromuscular re-education;Patient/family education;Orthotic Fit/Training;Wheelchair mobility training;Manual techniques;Vestibular;Dry needling;Passive range of motion    PT Next Visit Plan continue to work on LE stretching, adding strengthening in as able (?use of powder board due to weakness with concentric movements) and isometric strengthening; continue with standing and strengthening with use of Stedy    PT Home Exercise Plan Access Code: [email or web address]    Consulted and Agree with Plan of Care Patient             Patient will benefit from skilled therapeutic intervention in order to improve the following deficits and impairments:  Impaired tone, Postural dysfunction, Decreased mobility, Decreased activity tolerance, Decreased endurance, Decreased range of motion, Decreased strength, Impaired flexibility, Difficulty walking, Decreased balance, Abnormal gait, Impaired sensation  Visit Diagnosis: Abnormal posture  Muscle weakness (generalized)  Other abnormalities of  gait and mobility     Problem List Patient Active Problem List   Diagnosis Date Noted   Bilateral leg edema 02/09/2020   Elevated LDL cholesterol level 11/09/2019   Multiple subsegmental pulmonary emboli without acute cor pulmonale ([place]) 11/01/2019   Pressure injury of skin 09/25/2019   Benign prostatic hyperplasia with lower urinary tract symptoms 09/14/2019   Cervical stenosis of spinal canal 05/09/2019   Thoracic spinal stenosis 05/09/2019   Gait abnormality 08/08/2018   Spinal stenosis 08/08/2018   Iron deficiency anemia 02/09/2018   Bowel incontinence 01/01/2017   Vitamin D deficiency 01/11/2016   GERD (gastroesophageal reflux disease) 01/11/2016   Spinal stenosis, lumbar region, with neurogenic claudication 01/10/2016   Spinal stenosis of lumbar region with radiculopathy 01/10/2016   Elective surgery    Post-operative pain    Tachycardia    Peripheral neuropathy    Spondylolisthesis of lumbar region 12/30/2015   CTS (carpal tunnel syndrome) 09/26/2015   Lumbar stenosis 09/26/2015   Weakness 09/05/2015   Spinal stenosis in cervical region 06/12/2015   Leukocytopenia 05/22/2013   History of spinal cord injury 12/12/2011   Paraplegia ([place]) 12/11/2011   Altered mental status 12/11/2011   DM (diabetes mellitus) ([place]) 12/11/2011   Overactive bladder 12/11/2011   MS (multiple sclerosis) ([place])    Hypertension    Willow Ora, PTA, [facility] [address], [place], [place] [address] [phone] 12/18/20, 9:57 PM   Name: Parth Mccormac MRN: [id number] Date of Birth: Sep 30, 1950

## 2020-12-20 ENCOUNTER — Ambulatory Visit: Payer: Medicare Other | Admitting: Physical Therapy

## 2020-12-20 ENCOUNTER — Other Ambulatory Visit: Payer: Self-pay

## 2020-12-20 DIAGNOSIS — R2689 Other abnormalities of gait and mobility: Secondary | ICD-10-CM | POA: Diagnosis not present

## 2020-12-20 DIAGNOSIS — R293 Abnormal posture: Secondary | ICD-10-CM | POA: Diagnosis not present

## 2020-12-20 DIAGNOSIS — M6281 Muscle weakness (generalized): Secondary | ICD-10-CM | POA: Diagnosis not present

## 2020-12-20 NOTE — Therapy (Signed)
[facility] [address] [place], [place], [address] Phone: [phone]   Fax:  [phone]  Physical Therapy Treatment  Patient Details  Name: Lyrik Dickow MRN: [id number] Date of Birth: [birth year] Referring Provider (PT): Wilfred Lacy, NP   Encounter Date: 12/20/2020   PT End of Session - 12/20/20 1449     Visit Number 5    Number of Visits 17    Date for PT Re-Evaluation 01/17/21    Authorization Type UHC Medicare (10th Visit PN)    Progress Note Due on Visit 10    PT Start Time 1449   pt in restroom prior   PT Stop Time 1530    PT Time Calculation (min) 41 min    Equipment Utilized During Treatment Gait belt    Activity Tolerance Patient tolerated treatment well    Behavior During Therapy [facility] for tasks assessed/performed             Past Medical History:  Diagnosis Date   Abnormality of gait    Acute delirium 12/11/2011   Acute respiratory failure due to COVID-19 [facility]) 09/23/2019   CTS (carpal tunnel syndrome) 09/26/2015   Diabetes mellitus    Hypercholesterolemia    Hypertension    Kidney stones    Leukocytopenia 05/22/2013   MS (multiple sclerosis) ([place])    sees [facility]   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   Subjective Assessment - 12/20/20 1500     Subjective No issues to report. Pt states it felt good to do a long hamstring stretch    Pertinent History Carpal Tunnel, DM, Multiple Sclerosis (diagnosed in 1996), Hx of T10 SCI (2013), HTN    Limitations Walking;Standing;House hold activities    Patient Stated Goals Strengthen up the legs    Currently in Pain? No/denies                               [facility] Adult PT Treatment/Exercise - 12/20/20  0001       Transfers   Sit to Stand 4: Min guard    Sit to Stand Details Tactile cues for posture;Verbal cues for technique;Manual facilitation for weight shifting    Sit to Stand Details (indicate cue type and reason) cues/assist for foot placement on Stedy. min assist from low mat to stand in [place]. Min guard assist for standing from lift pads.    Stand to Sit 4: Min guard;4: Min assist;With upper extremity assist;To bed;Other (comment)    Stand to Sit Details (indicate cue type and reason) Verbal cues for sequencing;Verbal cues for technique;Verbal cues for safe use of DME/AE;Manual facilitation for weight shifting    Lateral/Scoot Transfers 6: Modified independent (Device/Increase time)      Knee/Hip Exercises: Stretches   Sports administrator Right;Left;2 reps;30 seconds    Hip Flexor Stretch Both;30 seconds    Other Knee/Hip Stretches Adductor butterfly stretch x30 sec    Other Knee/Hip Stretches Figure 4 stretch x30 sec each      Knee/Hip Exercises: Standing   Other Standing Knee Exercises Standing tolerance 3x2 min    Other Standing Knee Exercises quad sets x30 sec  Knee/Hip Exercises: Seated   Other Seated Knee/Hip Exercises Quad set 5x10"      Manual Therapy   Manual therapy comments Manual stretching of adductors, hip flexors, and quads                      PT Short Term Goals - 11/27/20 1647       PT SHORT TERM GOAL #1   Title Patient will be independent with initial HEP for ROM/Strength (ALL STGs Due: 12/27/20)    Baseline no HEP established    Time 4    Period Weeks    Status New    Target Date 12/27/20      PT SHORT TERM GOAL #2   Title Patient will be able to stand with BUE support at sink or with RW for >/= 1 minute, with CGA for improved tolerance for functional activities    Baseline 45 seconds    Time 4    Period Weeks               PT Long Term Goals - 11/27/20 1651       PT LONG TERM GOAL #1   Title Patient will be independent with  Final HEP for strengthening/ROM/balance (ALL LTGs:    Baseline no HEP established    Time 8    Period Weeks    Status New    Target Date 01/17/21      PT LONG TERM GOAL #2   Title Patient will be able to stand with BUE support at countertop or RW for >/= 3 minutes for improved standing tolerance with functional tasks    Baseline 45 seconds    Time 8    Period Weeks    Status New      PT LONG TERM GOAL #3   Title Patient will be able to ambulate x 10 ft with RW and Min A from PT for short household distances    Baseline TBA    Time 8    Period Weeks    Status New      PT LONG TERM GOAL #4   Title Patient will undergo further w/c assesment to determine need for power mobility    Baseline TBA    Time 8    Period Weeks    Status New                   Plan - 12/20/20 1532     Clinical Impression Statement Treatment focused on providing hip flexor stretching to improve trunk posture with standing. Continued working on standing endurance using Stedy. Discussed working on his quad strength to improve his standing. Added prolonged quad holds into HEP.    Personal Factors and Comorbidities Comorbidity 3+;Time since onset of injury/illness/exacerbation;Age    Comorbidities Carpal Tunnel, DM, Multiple Sclerosis, Hx of T10 SCI (2013), HTN    Examination-Activity Limitations Bed Mobility;Locomotion Level;Reach Overhead;Transfers;Toileting;Stand;Bathing;Sit;Dressing;Hygiene/Grooming    Examination-Participation Restrictions Interpersonal Relationship;Community Activity;Cleaning    Rehab Potential Good    PT Frequency 2x / week    PT Duration 8 weeks    PT Treatment/Interventions ADLs/Self Care Home Management;Moist Heat;Cryotherapy;DME Instruction;Gait training;Stair training;Functional mobility training;Therapeutic activities;Therapeutic exercise;Balance training;Neuromuscular re-education;Patient/family education;Orthotic Fit/Training;Wheelchair mobility training;Manual  techniques;Vestibular;Dry needling;Passive range of motion    PT Next Visit Plan continue to work on LE stretching (especially hips and quads), adding strengthening in as able and isometric strengthening; continue with standing and strengthening with use of Stedy    PT Home  Exercise Plan Access Code: [email or web address] and Agree with Plan of Care Patient             Patient will benefit from skilled therapeutic intervention in order to improve the following deficits and impairments:  Impaired tone, Postural dysfunction, Decreased mobility, Decreased activity tolerance, Decreased endurance, Decreased range of motion, Decreased strength, Impaired flexibility, Difficulty walking, Decreased balance, Abnormal gait, Impaired sensation  Visit Diagnosis: Abnormal posture  Muscle weakness (generalized)  Other abnormalities of gait and mobility     Problem List Patient Active Problem List   Diagnosis Date Noted   Bilateral leg edema 02/09/2020   Elevated LDL cholesterol level 11/09/2019   Multiple subsegmental pulmonary emboli without acute cor pulmonale ([place]) 11/01/2019   Pressure injury of skin 09/25/2019   Benign prostatic hyperplasia with lower urinary tract symptoms 09/14/2019   Cervical stenosis of spinal canal 05/09/2019   Thoracic spinal stenosis 05/09/2019   Gait abnormality 08/08/2018   Spinal stenosis 08/08/2018   Iron deficiency anemia 02/09/2018   Bowel incontinence 01/01/2017   Vitamin D deficiency 01/11/2016   GERD (gastroesophageal reflux disease) 01/11/2016   Spinal stenosis, lumbar region, with neurogenic claudication 01/10/2016   Spinal stenosis of lumbar region with radiculopathy 01/10/2016   Elective surgery    Post-operative pain    Tachycardia    Peripheral neuropathy    Spondylolisthesis of lumbar region 12/30/2015   CTS (carpal tunnel syndrome) 09/26/2015   Lumbar stenosis 09/26/2015   Weakness 09/05/2015   Spinal stenosis in cervical region  06/12/2015   Leukocytopenia 05/22/2013   History of spinal cord injury 12/12/2011   Paraplegia ([place]) 12/11/2011   Altered mental status 12/11/2011   DM (diabetes mellitus) ([place]) 12/11/2011   Overactive bladder 12/11/2011   MS (multiple sclerosis) [facility])    Hypertension     Derin Matthes April Ma L Stedman [place] PT, DPT 12/20/2020, 3:36 PM  [facility] [address] [place], [place], [address] Phone: [phone]   Fax:  [phone]  Name: Colby Rogan MRN: [id number] Date of Birth: 1951/01/31

## 2020-12-21 DIAGNOSIS — G8220 Paraplegia, unspecified: Secondary | ICD-10-CM | POA: Diagnosis not present

## 2020-12-21 DIAGNOSIS — G35 Multiple sclerosis: Secondary | ICD-10-CM | POA: Diagnosis not present

## 2020-12-21 DIAGNOSIS — M4802 Spinal stenosis, cervical region: Secondary | ICD-10-CM | POA: Diagnosis not present

## 2020-12-21 DIAGNOSIS — M4316 Spondylolisthesis, lumbar region: Secondary | ICD-10-CM | POA: Diagnosis not present

## 2020-12-23 ENCOUNTER — Other Ambulatory Visit: Payer: Self-pay | Admitting: Neurology

## 2020-12-25 ENCOUNTER — Other Ambulatory Visit: Payer: Self-pay

## 2020-12-25 ENCOUNTER — Encounter: Payer: Self-pay | Admitting: Physical Therapy

## 2020-12-25 ENCOUNTER — Ambulatory Visit: Payer: Medicare Other | Admitting: Physical Therapy

## 2020-12-25 DIAGNOSIS — R293 Abnormal posture: Secondary | ICD-10-CM

## 2020-12-25 DIAGNOSIS — M6281 Muscle weakness (generalized): Secondary | ICD-10-CM | POA: Diagnosis not present

## 2020-12-25 DIAGNOSIS — R2689 Other abnormalities of gait and mobility: Secondary | ICD-10-CM | POA: Diagnosis not present

## 2020-12-25 NOTE — Therapy (Signed)
[facility] [address] [place], [place], [address] Phone: [phone]   Fax:  [phone]  Physical Therapy Treatment  Patient Details  Name: Larry [name] MRN: [id number] Date of Birth: 10/19/1950 Referring Provider (PT): Wilfred Lacy, NP   Encounter Date: 12/25/2020   PT End of Session - 12/25/20 1406     Visit Number 6    Number of Visits 17    Date for PT Re-Evaluation 01/17/21    Authorization Type U[employer] Medicare (10th Visit PN)    Progress Note Due on Visit 10    PT Start Time 1402    PT Stop Time 1445    PT Time Calculation (min) 43 min    Equipment Utilized During Treatment Gait belt    Activity Tolerance Patient tolerated treatment well    Behavior During Therapy [facility] for tasks assessed/performed             Past Medical History:  Diagnosis Date   Abnormality of gait    Acute delirium 12/11/2011   Acute respiratory failure due to COVID-19 [facility]) 09/23/2019   CTS (carpal tunnel syndrome) 09/26/2015   Diabetes mellitus    Hypercholesterolemia    Hypertension    Kidney stones    Leukocytopenia 05/22/2013   MS (multiple sclerosis) ([place])    sees [facility]   T10 spinal cord injury ([place]) 12/12/2011   hx surgery 1996 with bilat gait abnormality since    Past Surgical History:  Procedure Laterality Date   BACK SURGERY     COLONOSCOPY     LUMBAR LAMINECTOMY      There were no vitals filed for this visit.   Subjective Assessment - 12/25/20 1405     Subjective No new fallls. Does feel he should be progressing faster than he is, then he also states he feels things are getting easier at times. No pain.    Pertinent History Carpal Tunnel, DM, Multiple Sclerosis (diagnosed in 1996), Hx of T10 SCI (2013), HTN    Limitations Walking;Standing;House hold activities    Patient Stated Goals Strengthen up the legs    Currently in Pain? No/denies                    [facility] Adult PT  Treatment/Exercise - 12/25/20 1407       Transfers   Transfers Lateral/Scoot Transfers    Lateral/Scoot Transfers 6: Modified independent (Device/Increase time)    Lateral/Scoot Transfer Details (indicate cue type and reason) wheelchair<>Nustep, then wheelchair<>mat table      Knee/Hip Exercises: Aerobic   Nustep level 3.0 x 5 minutes with UE/LE's, a few bouts of 10-15 sec's with just LE"s working on strengthening and activity tolerance.      Knee/Hip Exercises: Seated   Long Arc Quad AROM;AAROM;Strengthening;Both;1 set;10 reps;Limitations    Long Arc Quad Limitations assist to go through full range and for controlled lowering back to floor    Heel Slides AAROM;Strengthening;Both;1 set;10 reps;Limitations    Heel Slides Limitations foot on pillowcase with assist for full range of motion and slow, controlled movements on return      Knee/Hip Exercises: Supine   Bridges AROM;AAROM;Strengthening;Both;1 set;5 reps    Bridges Limitations assist needed to stabilize knees with minimal pelvic lift, did not clear mat surface therefore switched to pball under legs.    Other Supine Knee/Hip Exercises with legs extended and feet on pillow: pt pusing feet into ER against PTA resistance for isometric strengthening for 5 sec's x 10  reps; then had pt perform quad sets concurrent with glut sets for 5 sec holds for 10 reps.    Other Supine Knee/Hip Exercises with red ball under knees: bridges for 10 reps, lower trunk rotation for 10 reps with assit to stabilize LE's on ball. Then with ball at feet for HS curls with assist to bend knees/control ball for 10 reps.                      PT Short Term Goals - 11/27/20 1647       PT SHORT TERM GOAL #1   Title Patient will be independent with initial HEP for ROM/Strength (ALL STGs Due: 12/27/20)    Baseline no HEP established    Time 4    Period Weeks    Status New    Target Date 12/27/20      PT SHORT TERM GOAL #2   Title Patient will be able  to stand with BUE support at sink or with RW for >/= 1 minute, with CGA for improved tolerance for functional activities    Baseline 45 seconds    Time 4    Period Weeks               PT Long Term Goals - 11/27/20 1651       PT LONG TERM GOAL #1   Title Patient will be independent with Final HEP for strengthening/ROM/balance (ALL LTGs:    Baseline no HEP established    Time 8    Period Weeks    Status New    Target Date 01/17/21      PT LONG TERM GOAL #2   Title Patient will be able to stand with BUE support at countertop or RW for >/= 3 minutes for improved standing tolerance with functional tasks    Baseline 45 seconds    Time 8    Period Weeks    Status New      PT LONG TERM GOAL #3   Title Patient will be able to ambulate x 10 ft with RW and Min A from PT for short household distances    Baseline TBA    Time 8    Period Weeks    Status New      PT LONG TERM GOAL #4   Title Patient will undergo further w/c assesment to determine need for power mobility    Baseline TBA    Time 8    Period Weeks    Status New                   Plan - 12/25/20 1406     Clinical Impression Statement Today's skilled session continued to focus on LE/core strengthening with no issues noted or reported in session. The pt is progressing and should benefit from continued PT to progress toward unmet goals.    Personal Factors and Comorbidities Comorbidity 3+;Time since onset of injury/illness/exacerbation;Age    Comorbidities Carpal Tunnel, DM, Multiple Sclerosis, Hx of T10 SCI (2013), HTN    Examination-Activity Limitations Bed Mobility;Locomotion Level;Reach Overhead;Transfers;Toileting;Stand;Bathing;Sit;Dressing;Hygiene/Grooming    Examination-Participation Restrictions Interpersonal Relationship;Community Activity;Cleaning    Rehab Potential Good    PT Frequency 2x / week    PT Duration 8 weeks    PT Treatment/Interventions ADLs/Self Care Home Management;Moist  Heat;Cryotherapy;DME Instruction;Gait training;Stair training;Functional mobility training;Therapeutic activities;Therapeutic exercise;Balance training;Neuromuscular re-education;Patient/family education;Orthotic Fit/Training;Wheelchair mobility training;Manual techniques;Vestibular;Dry needling;Passive range of motion    PT Next Visit Plan continue to work on  LE stretching (especially hips and quads), adding strengthening in as able and isometric strengthening; continue with standing and strengthening with use of Stedy    PT Home Exercise Plan Access Code: [email or web address]    Consulted and Agree with Plan of Care Patient             Patient will benefit from skilled therapeutic intervention in order to improve the following deficits and impairments:  Impaired tone, Postural dysfunction, Decreased mobility, Decreased activity tolerance, Decreased endurance, Decreased range of motion, Decreased strength, Impaired flexibility, Difficulty walking, Decreased balance, Abnormal gait, Impaired sensation  Visit Diagnosis: Abnormal posture  Muscle weakness (generalized)  Other abnormalities of gait and mobility     Problem List Patient Active Problem List   Diagnosis Date Noted   Bilateral leg edema 02/09/2020   Elevated LDL cholesterol level 11/09/2019   Multiple subsegmental pulmonary emboli without acute cor pulmonale ([place]) 11/01/2019   Pressure injury of skin 09/25/2019   Benign prostatic hyperplasia with lower urinary tract symptoms 09/14/2019   Cervical stenosis of spinal canal 05/09/2019   Thoracic spinal stenosis 05/09/2019   Gait abnormality 08/08/2018   Spinal stenosis 08/08/2018   Iron deficiency anemia 02/09/2018   Bowel incontinence 01/01/2017   Vitamin D deficiency 01/11/2016   GERD (gastroesophageal reflux disease) 01/11/2016   Spinal stenosis, lumbar region, with neurogenic claudication 01/10/2016   Spinal stenosis of lumbar region with radiculopathy 01/10/2016   Elective  surgery    Post-operative pain    Tachycardia    Peripheral neuropathy    Spondylolisthesis of lumbar region 12/30/2015   CTS (carpal tunnel syndrome) 09/26/2015   Lumbar stenosis 09/26/2015   Weakness 09/05/2015   Spinal stenosis in cervical region 06/12/2015   Leukocytopenia 05/22/2013   History of spinal cord injury 12/12/2011   Paraplegia ([place]) 12/11/2011   Altered mental status 12/11/2011   DM (diabetes mellitus) ([place]) 12/11/2011   Overactive bladder 12/11/2011   MS (multiple sclerosis) ([place])    Hypertension     Willow Ora, PTA, [facility] [address], [place], [place] [address] [phone] 12/25/20, 9:11 PM    Name: Larry [name] MRN: [id number] Date of Birth: 11-12-1950

## 2020-12-27 ENCOUNTER — Ambulatory Visit: Payer: Medicare Other | Admitting: Physical Therapy

## 2020-12-27 ENCOUNTER — Other Ambulatory Visit: Payer: Self-pay

## 2020-12-27 ENCOUNTER — Encounter: Payer: Self-pay | Admitting: Physical Therapy

## 2020-12-27 DIAGNOSIS — M6281 Muscle weakness (generalized): Secondary | ICD-10-CM

## 2020-12-27 DIAGNOSIS — R2689 Other abnormalities of gait and mobility: Secondary | ICD-10-CM

## 2020-12-27 DIAGNOSIS — R293 Abnormal posture: Secondary | ICD-10-CM | POA: Diagnosis not present

## 2020-12-30 NOTE — Therapy (Signed)
[place] [address] [place], [place], [address] Phone: [phone]   Fax:  [phone]  Physical Therapy Treatment  Patient Details  Name: Larry [name] MRN: [id number] Date of Birth: 1951-03-24 Referring Provider (PT): Wilfred Lacy, NP   Encounter Date: 12/27/2020    12/27/20 1406  PT Visits / Re-Eval  Visit Number 7  Number of Visits 17  Date for PT Re-Evaluation 01/17/21  Authorization  Authorization Type [employer] Medicare (10th Visit PN)  Progress Note Due on Visit 10  PT Time Calculation  PT Start Time 7591  PT Stop Time 1445 (minus 10 minutes while pt was in bathroom- only charging for 32 minutes)  PT Time Calculation (min) 42 min  PT - End of Session  Equipment Utilized During Treatment Gait belt  Activity Tolerance Patient tolerated treatment well  Behavior During Therapy [facility] for tasks assessed/performed    Past Medical History:  Diagnosis Date   Abnormality of gait    Acute delirium 12/11/2011   Acute respiratory failure due to COVID-19 ([place]) 09/23/2019   CTS (carpal tunnel syndrome) 09/26/2015   Diabetes mellitus    Hypercholesterolemia    Hypertension    Kidney stones    Leukocytopenia 05/22/2013   MS (multiple sclerosis) ([place])    sees [facility]   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     12/27/20 1405  Symptoms/Limitations  Subjective No new compllaints. Felt good after last session, no issues. No pain or falls to report.  Pertinent History Carpal Tunnel, DM, Multiple Sclerosis (diagnosed in 1996), Hx of T10 SCI (2013), HTN  Limitations Walking;Standing;House hold activities  Patient Stated Goals Strengthen up the legs       12/27/20 1406  Transfers   Transfers Lateral/Scoot Transfers  Sit to Stand 4: Min guard;4: Min assist;With upper extremity assist;From chair/3-in-1  Stand to Sit 4: Min assist;With upper extremity assist;To chair/3-in-1  Stand to Sit Details wheelchair<>sink with cues to scoot to edge of seat, for anterior weight shifitng and hand placement. assist needed for propper foot placement, weight shifitng, to power up and for controlled descent with siting.  Lateral/Scoot Transfers 6: Modified independent (Device/Increase time)  Lateral/Scoot Transfer Details (indicate cue type and reason) wheelchair<>Nustep  Therapeutic Activites   Therapeutic Activities Other Therapeutic Activities  Other Therapeutic Activities standing at sink: working on upright posture, hip and knee extension with min guard to min assist and faclilitation for knee extension/hip extension at time. cues to keep knees of pillow in front of pt to promote knee exension and to "tuck you butt" for increased hip extension. Standing times as follows: 1.04.82, 1.44.56, 1.37.97, 1.12.50. min guard to min assist as pt fatigued.  Knee/Hip Exercises: Aerobic  Nustep level 4.0 x 8 minutes with UE/LE's, a few bouts of 10-15 sec's with just LE"s working on strengthening and activity tolerance.             PT Short Term Goals - 11/27/20 1647       PT SHORT TERM GOAL #1   Title Patient will be independent with initial HEP for ROM/Strength (ALL STGs Due: 12/27/20)    Baseline no HEP established    Time 4    Period Weeks  Status New    Target Date 12/27/20      PT SHORT TERM GOAL #2   Title Patient will be able to stand with BUE support at sink or with RW for >/= 1 minute, with CGA for improved tolerance for functional activities    Baseline 45 seconds    Time 4    Period Weeks               PT Long Term Goals - 11/27/20 1651       PT LONG TERM GOAL #1   Title Patient will be independent with Final HEP for strengthening/ROM/balance (ALL LTGs:     Baseline no HEP established    Time 8    Period Weeks    Status New    Target Date 01/17/21      PT LONG TERM GOAL #2   Title Patient will be able to stand with BUE support at countertop or RW for >/= 3 minutes for improved standing tolerance with functional tasks    Baseline 45 seconds    Time 8    Period Weeks    Status New      PT LONG TERM GOAL #3   Title Patient will be able to ambulate x 10 ft with RW and Min A from PT for short household distances    Baseline TBA    Time 8    Period Weeks    Status New      PT LONG TERM GOAL #4   Title Patient will undergo further w/c assesment to determine need for power mobility    Baseline TBA    Time 8    Period Weeks    Status New              12/27/20 1406  Plan  Clinical Impression Statement Today's skilled session focused on progress toward STGs with goals met. Continued to work on transfers, strengthening and standing posture/tolerance. The pt is making progress toward goals and should benefit from continued PT to progress toward unmet goals.  Personal Factors and Comorbidities Comorbidity 3+;Time since onset of injury/illness/exacerbation;Age  Comorbidities Carpal Tunnel, DM, Multiple Sclerosis, Hx of T10 SCI (2013), HTN  Examination-Activity Limitations Bed Mobility;Locomotion Level;Reach Overhead;Transfers;Toileting;Stand;Bathing;Sit;Dressing;Hygiene/Grooming  Examination-Participation Restrictions Interpersonal Relationship;Community Activity;Cleaning  Pt will benefit from skilled therapeutic intervention in order to improve on the following deficits Impaired tone;Postural dysfunction;Decreased mobility;Decreased activity tolerance;Decreased endurance;Decreased range of motion;Decreased strength;Impaired flexibility;Difficulty walking;Decreased balance;Abnormal gait;Impaired sensation  Rehab Potential Good  PT Frequency 2x / week  PT Duration 8 weeks  PT Treatment/Interventions ADLs/Self Care Home Management;Moist  Heat;Cryotherapy;DME Instruction;Gait training;Stair training;Functional mobility training;Therapeutic activities;Therapeutic exercise;Balance training;Neuromuscular re-education;Patient/family education;Orthotic Fit/Training;Wheelchair mobility training;Manual techniques;Vestibular;Dry needling;Passive range of motion  PT Next Visit Plan continue to work on LE stretching (especially hips and quads), adding strengthening in as able and isometric strengthening; continue with standing and strengthening with use of Stedy  PT Home Exercise Plan Access Code: [email or web address]  Consulted and Agree with Plan of Care Patient          Patient will benefit from skilled therapeutic intervention in order to improve the following deficits and impairments:  Impaired tone, Postural dysfunction, Decreased mobility, Decreased activity tolerance, Decreased endurance, Decreased range of motion, Decreased strength, Impaired flexibility, Difficulty walking, Decreased balance, Abnormal gait, Impaired sensation  Visit Diagnosis: Muscle weakness (generalized)  Abnormal posture  Other abnormalities of gait and mobility     Problem List Patient Active Problem List   Diagnosis Date Noted   Bilateral  leg edema 02/09/2020   Elevated LDL cholesterol level 11/09/2019   Multiple subsegmental pulmonary emboli without acute cor pulmonale ([facility]) 11/01/2019   Pressure injury of skin 09/25/2019   Benign prostatic hyperplasia with lower urinary tract symptoms 09/14/2019   Cervical stenosis of spinal canal 05/09/2019   Thoracic spinal stenosis 05/09/2019   Gait abnormality 08/08/2018   Spinal stenosis 08/08/2018   Iron deficiency anemia 02/09/2018   Bowel incontinence 01/01/2017   Vitamin D deficiency 01/11/2016   GERD (gastroesophageal reflux disease) 01/11/2016   Spinal stenosis, lumbar region, with neurogenic claudication 01/10/2016   Spinal stenosis of lumbar region with radiculopathy 01/10/2016   Elective surgery     Post-operative pain    Tachycardia    Peripheral neuropathy    Spondylolisthesis of lumbar region 12/30/2015   CTS (carpal tunnel syndrome) 09/26/2015   Lumbar stenosis 09/26/2015   Weakness 09/05/2015   Spinal stenosis in cervical region 06/12/2015   Leukocytopenia 05/22/2013   History of spinal cord injury 12/12/2011   Paraplegia ([place]) 12/11/2011   Altered mental status 12/11/2011   DM (diabetes mellitus) ([place]) 12/11/2011   Overactive bladder 12/11/2011   MS (multiple sclerosis) ([place])    Hypertension    Willow Ora, PTA, [facility] [address], [place], [place] [address] [phone] 12/30/20, 10:54 AM   Name: Larry [name] MRN: [id number] Date of Birth: [birth year]

## 2021-01-01 ENCOUNTER — Ambulatory Visit: Payer: Medicare Other | Attending: Nurse Practitioner

## 2021-01-01 ENCOUNTER — Other Ambulatory Visit: Payer: Self-pay

## 2021-01-01 DIAGNOSIS — R2689 Other abnormalities of gait and mobility: Secondary | ICD-10-CM | POA: Diagnosis not present

## 2021-01-01 DIAGNOSIS — R293 Abnormal posture: Secondary | ICD-10-CM | POA: Diagnosis not present

## 2021-01-01 DIAGNOSIS — M6281 Muscle weakness (generalized): Secondary | ICD-10-CM | POA: Insufficient documentation

## 2021-01-01 NOTE — Therapy (Signed)
[facility] [address] [place], [place], [address] Phone: [phone]   Fax:  [phone]  Physical Therapy Treatment  Patient Details  Name: Bill Wied MRN: [id number] Date of Birth: January 02, 1951 Referring Provider (PT): Wilfred Lacy, NP   Encounter Date: 01/01/2021   PT End of Session - 01/01/21 1412     Visit Number 8    Number of Visits 17    Date for PT Re-Evaluation 01/17/21    Authorization Type [employer] Medicare (10th Visit PN)    Progress Note Due on Visit 10    PT Start Time 1411   patient having to use bathroom prior to session   PT Stop Time 1445    PT Time Calculation (min) 34 min    Equipment Utilized During Treatment Gait belt    Activity Tolerance Patient tolerated treatment well    Behavior During Therapy [facility] for tasks assessed/performed             Past Medical History:  Diagnosis Date   Abnormality of gait    Acute delirium 12/11/2011   Acute respiratory failure due to COVID-19 [facility]) 09/23/2019   CTS (carpal tunnel syndrome) 09/26/2015   Diabetes mellitus    Hypercholesterolemia    Hypertension    Kidney stones    Leukocytopenia 05/22/2013   MS (multiple sclerosis) ([place])    sees [facility]   T10 spinal cord injury ([place]) 12/12/2011   hx surgery 1996 with bilat gait abnormality since    Past Surgical History:  Procedure Laterality Date   BACK SURGERY     COLONOSCOPY     LUMBAR LAMINECTOMY      There were no vitals filed for this visit.   Subjective Assessment - 01/01/21 1412     Subjective No new changes/complaints. Just felt stiff today, especailly the left leg down.    Pertinent History Carpal Tunnel, DM, Multiple Sclerosis (diagnosed in 1996), Hx of T10 SCI (2013), HTN    Limitations Walking;Standing;House hold activities    Patient Stated Goals Strengthen up the legs    Currently in Pain? No/denies                               [facility] Adult PT  Treatment/Exercise - 01/01/21 0001       Transfers   Transfers Lateral/Scoot Transfers    Sit to Stand 4: Min guard;4: Min assist;With upper extremity assist;From chair/3-in-1    Stand to Sit 4: Min assist;With upper extremity assist;To chair/3-in-1    Stand to Sit Details (indicate cue type and reason) Verbal cues for sequencing;Verbal cues for technique;Verbal cues for safe use of DME/AE;Manual facilitation for weight shifting    Stand to Sit Details continue to provide assist for foot placement at times. as as well as proper alignment in standing    Lateral/Scoot Transfers 6: Modified independent (Device/Increase time)    Lateral/Scoot Transfer Details (indicate cue type and reason) wheelchair <> NuStep      Therapeutic Activites    Therapeutic Activities Other Therapeutic Activities    Other Therapeutic Activities standing at sink, working on up tall posture and engaging posterior hip to maintain hip extension, cues/faciliation from PT providing. completed static standing x 1 min, then x 1 min 5 secs with second rep. Moved to // bars to further work on standing tolerance, with PT positioned in front. Completed standing with alternating UE lift x 3 reps bilaterally, signigicant challenge  to posture and upright standing with addition fo UE lift. PT providing tactile cues and faciliation (min A) at posterior hip to maintain upright posture throughout. intemrittent rest breaks required, and varying assist due to fatigue.      Exercises   Exercises Other Exercises    Other Exercises  seated in w/c: completed hamstring curl with towel on BLE, 2 x 5 reps each. increased challenge with LLE > RLE.      Knee/Hip Exercises: Aerobic   Nustep Completed NuStep with BLE/BUE on Level 4 x 7 minutes for improved strength/activity tolerance.                      PT Short Term Goals - 12/27/20 1700       PT SHORT TERM GOAL #1   Title Patient will be independent with initial HEP for  ROM/Strength (ALL STGs Due: 12/27/20)    Baseline no HEP established    Status Achieved      PT SHORT TERM GOAL #2   Title Patient will be able to stand with BUE support at sink or with RW for >/= 1 minute, with CGA for improved tolerance for functional activities    Baseline 45 seconds    Status Achieved               PT Long Term Goals - 11/27/20 1651       PT LONG TERM GOAL #1   Title Patient will be independent with Final HEP for strengthening/ROM/balance (ALL LTGs:    Baseline no HEP established    Time 8    Period Weeks    Status New    Target Date 01/17/21      PT LONG TERM GOAL #2   Title Patient will be able to stand with BUE support at countertop or RW for >/= 3 minutes for improved standing tolerance with functional tasks    Baseline 45 seconds    Time 8    Period Weeks    Status New      PT LONG TERM GOAL #3   Title Patient will be able to ambulate x 10 ft with RW and Min A from PT for short household distances    Baseline TBA    Time 8    Period Weeks    Status New      PT LONG TERM GOAL #4   Title Patient will undergo further w/c assesment to determine need for power mobility    Baseline TBA    Time 8    Period Weeks    Status New                   Plan - 01/01/21 1421     Clinical Impression Statement Today's skilled session limited due to patient needing to use bathroom at start of session. Session focused on continued standing tolerance and working toward improved alignment and reducing UE support. Continue to require Min A with increased fatigue. Continued BLE strengthening and NuStep for improved activity tolerance. Continue to require intemrittnet rest breaks. Will continue per POC and progress toward all LTGs.    Personal Factors and Comorbidities Comorbidity 3+;Time since onset of injury/illness/exacerbation;Age    Comorbidities Carpal Tunnel, DM, Multiple Sclerosis, Hx of T10 SCI (2013), HTN    Examination-Activity Limitations Bed  Mobility;Locomotion Level;Reach Overhead;Transfers;Toileting;Stand;Bathing;Sit;Dressing;Hygiene/Grooming    Examination-Participation Restrictions Interpersonal Relationship;Community Activity;Cleaning    Rehab Potential Good    PT Frequency 2x / week  PT Duration 8 weeks    PT Treatment/Interventions ADLs/Self Care Home Management;Moist Heat;Cryotherapy;DME Instruction;Gait training;Stair training;Functional mobility training;Therapeutic activities;Therapeutic exercise;Balance training;Neuromuscular re-education;Patient/family education;Orthotic Fit/Training;Wheelchair mobility training;Manual techniques;Vestibular;Dry needling;Passive range of motion    PT Next Visit Plan continue to work on LE stretching (especially hips and quads), adding strengthening in as able and isometric strengthening; continue with standing and strengthening with use of Stedy    PT Home Exercise Plan Access Code: [email or web address]    Consulted and Agree with Plan of Care Patient             Patient will benefit from skilled therapeutic intervention in order to improve the following deficits and impairments:  Impaired tone, Postural dysfunction, Decreased mobility, Decreased activity tolerance, Decreased endurance, Decreased range of motion, Decreased strength, Impaired flexibility, Difficulty walking, Decreased balance, Abnormal gait, Impaired sensation  Visit Diagnosis: Muscle weakness (generalized)  Abnormal posture  Other abnormalities of gait and mobility     Problem List Patient Active Problem List   Diagnosis Date Noted   Bilateral leg edema 02/09/2020   Elevated LDL cholesterol level 11/09/2019   Multiple subsegmental pulmonary emboli without acute cor pulmonale ([place]) 11/01/2019   Pressure injury of skin 09/25/2019   Benign prostatic hyperplasia with lower urinary tract symptoms 09/14/2019   Cervical stenosis of spinal canal 05/09/2019   Thoracic spinal stenosis 05/09/2019   Gait abnormality  08/08/2018   Spinal stenosis 08/08/2018   Iron deficiency anemia 02/09/2018   Bowel incontinence 01/01/2017   Vitamin D deficiency 01/11/2016   GERD (gastroesophageal reflux disease) 01/11/2016   Spinal stenosis, lumbar region, with neurogenic claudication 01/10/2016   Spinal stenosis of lumbar region with radiculopathy 01/10/2016   Elective surgery    Post-operative pain    Tachycardia    Peripheral neuropathy    Spondylolisthesis of lumbar region 12/30/2015   CTS (carpal tunnel syndrome) 09/26/2015   Lumbar stenosis 09/26/2015   Weakness 09/05/2015   Spinal stenosis in cervical region 06/12/2015   Leukocytopenia 05/22/2013   History of spinal cord injury 12/12/2011   Paraplegia ([place]) 12/11/2011   Altered mental status 12/11/2011   DM (diabetes mellitus) ([place]) 12/11/2011   Overactive bladder 12/11/2011   MS (multiple sclerosis) ([place])    Hypertension     Jones Bales, PT, DPT 01/01/2021, 2:48 PM  [facility] [address] [place], [place], [address] Phone: [phone]   Fax:  [phone]  Name: Elliott Gemberling MRN: [id number] Date of Birth: 07/16/1950

## 2021-01-03 ENCOUNTER — Ambulatory Visit: Payer: Medicare Other

## 2021-01-03 ENCOUNTER — Other Ambulatory Visit: Payer: Self-pay

## 2021-01-03 DIAGNOSIS — M6281 Muscle weakness (generalized): Secondary | ICD-10-CM | POA: Diagnosis not present

## 2021-01-03 DIAGNOSIS — R2689 Other abnormalities of gait and mobility: Secondary | ICD-10-CM | POA: Diagnosis not present

## 2021-01-03 DIAGNOSIS — R293 Abnormal posture: Secondary | ICD-10-CM | POA: Diagnosis not present

## 2021-01-03 NOTE — Patient Instructions (Signed)
Access Code: [email or web address] URL: [email or web address] Date: 01/03/2021 Prepared by: Baldomero Lamy  Exercises Bent Knee Fallouts - 1 x daily - 7 x weekly - 2 sets - 10 reps Supine March - 1 x daily - 7 x weekly - 3 sets - 10 reps Supine Hamstring Stretch with Strap - 1 x daily - 7 x weekly - 1 sets - 3 reps - 30 seconds hold Supine Single Knee to Chest Stretch - 1 x daily - 7 x weekly - 1 sets - 3 reps - 30 seconds hold Supine Lower Trunk Rotation - 1 x daily - 7 x weekly - 1 sets - 5 reps - 15 seconds hold Modified Thomas Stretch - 1 x daily - 7 x weekly - 30 sec hold Seated Long Arc Quad - 1 x daily - 7 x weekly - 2 sets - 10 reps Seated Quad Set - 1 x daily - 7 x weekly - 2 sets - 10 reps - 10 sec hold Seated Hip Adduction Isometrics with Ball - 1 x daily - 7 x weekly - 2 sets - 10 reps

## 2021-01-03 NOTE — Therapy (Signed)
[facility] [address] [place], [place], [address] Phone: [phone]   Fax:  [phone]  Physical Therapy Treatment  Patient Details  Name: Larry [name] MRN: [id number] Date of Birth: 20-Apr-1951 Referring Provider (PT): Wilfred Lacy, NP   Encounter Date: 01/03/2021   PT End of Session - 01/03/21 1417     Visit Number 9    Number of Visits 17    Date for PT Re-Evaluation 01/17/21    Authorization Type [employer] Medicare (10th Visit PN)    Progress Note Due on Visit 10    PT Start Time 1408   patient having to use bathroom prior to start of session   PT Stop Time 1448    PT Time Calculation (min) 40 min    Equipment Utilized During Treatment Gait belt    Activity Tolerance Patient tolerated treatment well    Behavior During Therapy [facility] for tasks assessed/performed             Past Medical History:  Diagnosis Date   Abnormality of gait    Acute delirium 12/11/2011   Acute respiratory failure due to COVID-19 [facility]) 09/23/2019   CTS (carpal tunnel syndrome) 09/26/2015   Diabetes mellitus    Hypercholesterolemia    Hypertension    Kidney stones    Leukocytopenia 05/22/2013   MS (multiple sclerosis) ([place])    sees [facility]   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   Subjective Assessment - 01/03/21 1417     Subjective Paitent denies new changes/complaints. No falls or pain to report.    Pertinent History Carpal Tunnel, DM, Multiple Sclerosis (diagnosed in 1996), Hx of T10 SCI (2013), HTN    Limitations Walking;Standing;House hold activities    Patient Stated Goals Strengthen up the legs    Currently in Pain? No/denies                   [facility] Adult PT Treatment/Exercise -  01/03/21 0001       Transfers   Transfers Lateral/Scoot Transfers    Sit to Stand 4: Min guard;4: Min assist;With upper extremity assist;From chair/3-in-1    Stand to Sit 4: Min assist;With upper extremity assist;To chair/3-in-1    Stand to Sit Details (indicate cue type and reason) Verbal cues for sequencing;Verbal cues for technique;Verbal cues for safe use of DME/AE;Manual facilitation for weight shifting    Lateral/Scoot Transfers 6: Modified independent (Device/Increase time)    Lateral/Scoot Transfer Details (indicate cue type and reason) completed from w/c <> NuStep seat Mod I, as well as from w/c <> mat      Neuro Re-ed    Neuro Re-ed Details  in STEDY: completed standing with BUE support to work on improved standing tolerance, 1st rep for 1 min 22 secs; completed sit <> stands from stedy seat x 2 x 5 reps working on glute engagement and maintaining hip extension upon standing position. PT providing faciliation at posterior hip/pelvis. Intermmittent rest breaks required. Completed sit <> stand upon standing working on BUE flexion to reduce UE support 2 x 5 reps. PT using silver theraband at posterior hip to promote improved hip extension.      Exercises   Exercises Knee/Hip  Other Exercises  Reviewed some exercises included on HEP: inclding completed isometric hip adduction with pillow between knees x 10 reps. PT educaitng on proper ocmpletion and updating HEP to be seated vs. supine for improved compiance. Also review isometric quad set from seated postiion, completed x 5 reps bilaterally with 3 second hold. Updated HEP and provided handout.      Knee/Hip Exercises: Aerobic   Nustep Completed NuStep with BLE/BUE on Level 4 x 7 minutes for improved strength/activity tolerance.            Access Code: [email or web address] URL: [email or web address] Date: 01/03/2021 Prepared by: Baldomero Lamy  Exercises Bent Knee Fallouts - 1 x daily - 7 x weekly - 2 sets - 10 reps Supine  March - 1 x daily - 7 x weekly - 3 sets - 10 reps Supine Hamstring Stretch with Strap - 1 x daily - 7 x weekly - 1 sets - 3 reps - 30 seconds hold Supine Single Knee to Chest Stretch - 1 x daily - 7 x weekly - 1 sets - 3 reps - 30 seconds hold Supine Lower Trunk Rotation - 1 x daily - 7 x weekly - 1 sets - 5 reps - 15 seconds hold Modified Thomas Stretch - 1 x daily - 7 x weekly - 30 sec hold Seated Long Arc Quad - 1 x daily - 7 x weekly - 2 sets - 10 reps Seated Quad Set - 1 x daily - 7 x weekly - 2 sets - 10 reps - 10 sec hold Seated Hip Adduction Isometrics with Ball - 1 x daily - 7 x weekly - 2 sets - 10 reps         PT Education - 01/03/21 1453     Education Details HEP review; Update to Isometric Adduction    Person(s) Educated Patient    Methods Explanation;Demonstration;Handout    Comprehension Returned demonstration;Verbalized understanding              PT Short Term Goals - 12/27/20 1700       PT SHORT TERM GOAL #1   Title Patient will be independent with initial HEP for ROM/Strength (ALL STGs Due: 12/27/20)    Baseline no HEP established    Status Achieved      PT SHORT TERM GOAL #2   Title Patient will be able to stand with BUE support at sink or with RW for >/= 1 minute, with CGA for improved tolerance for functional activities    Baseline 45 seconds    Status Achieved               PT Long Term Goals - 11/27/20 1651       PT LONG TERM GOAL #1   Title Patient will be independent with Final HEP for strengthening/ROM/balance (ALL LTGs:    Baseline no HEP established    Time 8    Period Weeks    Status New    Target Date 01/17/21      PT LONG TERM GOAL #2   Title Patient will be able to stand with BUE support at countertop or RW for >/= 3 minutes for improved standing tolerance with functional tasks    Baseline 45 seconds    Time 8    Period Weeks    Status New      PT LONG TERM GOAL #3   Title Patient will be able to ambulate x 10 ft with  RW and Min A from PT for  short household distances    Baseline TBA    Time 8    Period Weeks    Status New      PT LONG TERM GOAL #4   Title Patient will undergo further w/c assesment to determine need for power mobility    Baseline TBA    Time 8    Period Weeks    Status New                   Plan - 01/03/21 1456     Clinical Impression Statement Continued standing activities in STEDY today working toward improved hip extension and sit <> stand training. Contineud BLE strengthening with NuStep and updated HEP to promtoe improved compliance and independence with exercises at home. Will continue to progress toward all LTGs.    Personal Factors and Comorbidities Comorbidity 3+;Time since onset of injury/illness/exacerbation;Age    Comorbidities Carpal Tunnel, DM, Multiple Sclerosis, Hx of T10 SCI (2013), HTN    Examination-Activity Limitations Bed Mobility;Locomotion Level;Reach Overhead;Transfers;Toileting;Stand;Bathing;Sit;Dressing;Hygiene/Grooming    Examination-Participation Restrictions Interpersonal Relationship;Community Activity;Cleaning    Rehab Potential Good    PT Frequency 2x / week    PT Duration 8 weeks    PT Treatment/Interventions ADLs/Self Care Home Management;Moist Heat;Cryotherapy;DME Instruction;Gait training;Stair training;Functional mobility training;Therapeutic activities;Therapeutic exercise;Balance training;Neuromuscular re-education;Patient/family education;Orthotic Fit/Training;Wheelchair mobility training;Manual techniques;Vestibular;Dry needling;Passive range of motion    PT Next Visit Plan continue to work on LE stretching (especially hips and quads), adding strengthening in as able and isometric strengthening; continue with standing and strengthening with use of Stedy    PT Home Exercise Plan Access Code: [email or web address]    Consulted and Agree with Plan of Care Patient             Patient will benefit from skilled therapeutic intervention in order to  improve the following deficits and impairments:  Impaired tone, Postural dysfunction, Decreased mobility, Decreased activity tolerance, Decreased endurance, Decreased range of motion, Decreased strength, Impaired flexibility, Difficulty walking, Decreased balance, Abnormal gait, Impaired sensation  Visit Diagnosis: Muscle weakness (generalized)  Abnormal posture  Other abnormalities of gait and mobility     Problem List Patient Active Problem List   Diagnosis Date Noted   Bilateral leg edema 02/09/2020   Elevated LDL cholesterol level 11/09/2019   Multiple subsegmental pulmonary emboli without acute cor pulmonale ([place]) 11/01/2019   Pressure injury of skin 09/25/2019   Benign prostatic hyperplasia with lower urinary tract symptoms 09/14/2019   Cervical stenosis of spinal canal 05/09/2019   Thoracic spinal stenosis 05/09/2019   Gait abnormality 08/08/2018   Spinal stenosis 08/08/2018   Iron deficiency anemia 02/09/2018   Bowel incontinence 01/01/2017   Vitamin D deficiency 01/11/2016   GERD (gastroesophageal reflux disease) 01/11/2016   Spinal stenosis, lumbar region, with neurogenic claudication 01/10/2016   Spinal stenosis of lumbar region with radiculopathy 01/10/2016   Elective surgery    Post-operative pain    Tachycardia    Peripheral neuropathy    Spondylolisthesis of lumbar region 12/30/2015   CTS (carpal tunnel syndrome) 09/26/2015   Lumbar stenosis 09/26/2015   Weakness 09/05/2015   Spinal stenosis in cervical region 06/12/2015   Leukocytopenia 05/22/2013   History of spinal cord injury 12/12/2011   Paraplegia ([place]) 12/11/2011   Altered mental status 12/11/2011   DM (diabetes mellitus) ([place]) 12/11/2011   Overactive bladder 12/11/2011   MS (multiple sclerosis) ([place])    Hypertension     Jones Bales, PT, DPT 01/03/2021, 2:57 PM  [facility]  [address] [place], [place], [address] Phone:  [phone]   Fax:  [phone]  Name: Larry [name] MRN: [id number] Date of Birth: [birth year]

## 2021-01-08 ENCOUNTER — Ambulatory Visit: Payer: Medicare Other | Admitting: Physical Therapy

## 2021-01-08 ENCOUNTER — Encounter: Payer: Self-pay | Admitting: Physical Therapy

## 2021-01-08 ENCOUNTER — Other Ambulatory Visit: Payer: Self-pay

## 2021-01-08 DIAGNOSIS — R2689 Other abnormalities of gait and mobility: Secondary | ICD-10-CM | POA: Diagnosis not present

## 2021-01-08 DIAGNOSIS — M6281 Muscle weakness (generalized): Secondary | ICD-10-CM | POA: Diagnosis not present

## 2021-01-08 DIAGNOSIS — R293 Abnormal posture: Secondary | ICD-10-CM

## 2021-01-09 ENCOUNTER — Ambulatory Visit (INDEPENDENT_AMBULATORY_CARE_PROVIDER_SITE_OTHER): Payer: Medicare Other | Admitting: Nurse Practitioner

## 2021-01-09 ENCOUNTER — Encounter: Payer: Self-pay | Admitting: Nurse Practitioner

## 2021-01-09 VITALS — BP 130/64 | HR 78 | Temp 97.8°F | Wt 169.4 lb

## 2021-01-09 DIAGNOSIS — N401 Enlarged prostate with lower urinary tract symptoms: Secondary | ICD-10-CM | POA: Diagnosis not present

## 2021-01-09 DIAGNOSIS — D509 Iron deficiency anemia, unspecified: Secondary | ICD-10-CM | POA: Diagnosis not present

## 2021-01-09 DIAGNOSIS — R350 Frequency of micturition: Secondary | ICD-10-CM

## 2021-01-09 DIAGNOSIS — I10 Essential (primary) hypertension: Secondary | ICD-10-CM | POA: Diagnosis not present

## 2021-01-09 DIAGNOSIS — E1142 Type 2 diabetes mellitus with diabetic polyneuropathy: Secondary | ICD-10-CM

## 2021-01-09 DIAGNOSIS — E7800 Pure hypercholesterolemia, unspecified: Secondary | ICD-10-CM | POA: Diagnosis not present

## 2021-01-09 LAB — CBC WITH DIFFERENTIAL/PLATELET
Basophils Absolute: 0 10*3/uL (ref 0.0–0.1)
Basophils Relative: 0.8 % (ref 0.0–3.0)
Eosinophils Absolute: 0.1 10*3/uL (ref 0.0–0.7)
Eosinophils Relative: 1.7 % (ref 0.0–5.0)
HCT: 38.8 % — ABNORMAL LOW (ref 39.0–52.0)
Hemoglobin: 12.8 g/dL — ABNORMAL LOW (ref 13.0–17.0)
Lymphocytes Relative: 18.2 % (ref 12.0–46.0)
Lymphs Abs: 0.7 10*3/uL (ref 0.7–4.0)
MCHC: 32.9 g/dL (ref 30.0–36.0)
MCV: 93.6 fl (ref 78.0–100.0)
Monocytes Absolute: 0.2 10*3/uL (ref 0.1–1.0)
Monocytes Relative: 4 % (ref 3.0–12.0)
Neutro Abs: 2.9 10*3/uL (ref 1.4–7.7)
Neutrophils Relative %: 75.3 % (ref 43.0–77.0)
Platelets: 281 10*3/uL (ref 150.0–400.0)
RBC: 4.14 Mil/uL — ABNORMAL LOW (ref 4.22–5.81)
RDW: 14.2 % (ref 11.5–15.5)
WBC: 3.9 10*3/uL — ABNORMAL LOW (ref 4.0–10.5)

## 2021-01-09 LAB — HEPATIC FUNCTION PANEL
ALT: 16 U/L (ref 0–53)
AST: 28 U/L (ref 0–37)
Albumin: 4.7 g/dL (ref 3.5–5.2)
Alkaline Phosphatase: 97 U/L (ref 39–117)
Bilirubin, Direct: 0.1 mg/dL (ref 0.0–0.3)
Total Bilirubin: 0.5 mg/dL (ref 0.2–1.2)
Total Protein: 6.9 g/dL (ref 6.0–8.3)

## 2021-01-09 LAB — LDL CHOLESTEROL, DIRECT: Direct LDL: 64 mg/dL

## 2021-01-09 LAB — BASIC METABOLIC PANEL
BUN: 21 mg/dL (ref 6–23)
CO2: 30 mEq/L (ref 19–32)
Calcium: 10 mg/dL (ref 8.4–10.5)
Chloride: 101 mEq/L (ref 96–112)
Creatinine, Ser: 1.06 mg/dL (ref 0.40–1.50)
GFR: 70.16 mL/min (ref >60.00)
Glucose, Bld: 115 mg/dL — ABNORMAL HIGH (ref 70–99)
Potassium: 3.9 mEq/L (ref 3.5–5.1)
Sodium: 140 mEq/L (ref 135–145)

## 2021-01-09 LAB — PSA: PSA: 0.72 ng/mL (ref 0.10–4.00)

## 2021-01-09 LAB — HEMOGLOBIN A1C: Hgb A1c MFr Bld: 6.7 % — ABNORMAL HIGH (ref 4.6–6.5)

## 2021-01-09 NOTE — Therapy (Signed)
[facility] [address] [place], [place], [address] Phone: [phone]   Fax:  [phone]  Physical Therapy Treatment  Patient Details  Name: Larry [name] MRN: [id number] Date of Birth: [birth year] Referring Provider (PT): Wilfred Lacy, NP   Encounter Date: 01/08/2021   PT End of Session - 01/08/21 1407     Visit Number 10    Number of Visits 17    Date for PT Re-Evaluation 01/17/21    Authorization Type UHC Medicare (10th Visit PN)    Progress Note Due on Visit 27    PT Start Time 1404    PT Stop Time 1445   -8 minutes pt was in bathroom   PT Time Calculation (min) 41 min    Equipment Utilized During Treatment Gait belt    Activity Tolerance Patient tolerated treatment well    Behavior During Therapy [facility] for tasks assessed/performed             Past Medical History:  Diagnosis Date   Abnormality of gait    Acute delirium 12/11/2011   Acute respiratory failure due to COVID-19 [facility]) 09/23/2019   CTS (carpal tunnel syndrome) 09/26/2015   Diabetes mellitus    Hypercholesterolemia    Hypertension    Kidney stones    Leukocytopenia 05/22/2013   MS (multiple sclerosis) ([place])    sees [facility]   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   Subjective Assessment - 01/08/21 1406     Subjective No new compaints. No falls or pain to report. Did have trouble getting comfortable for bed last night, not a usual issue. Unsure why it was difficult last night.    Pertinent History Carpal Tunnel, DM, Multiple Sclerosis (diagnosed in 1996), Hx of T10 SCI (2013), HTN    Limitations Walking;Standing;House hold activities    Patient Stated Goals Strengthen up the legs    Currently in Pain? No/denies                    [facility] Adult PT Treatment/Exercise - 01/08/21 1407       Transfers   Transfers Lateral/Scoot Transfers    Sit to Stand 4: Min guard;4: Min assist;With upper extremity assist;From bed;Other (comment)   from lift pads of [place]   Stand to Sit 4: Min guard;With upper extremity assist;To bed;Other (comment)   lift pads of [place]   Lateral/Scoot Transfers 6: Modified independent (Device/Increase time)    Lateral/Scoot Transfer Details (indicate cue type and reason) wheelchair<>Nustep and wheelchair<>mat table      Therapeutic Activites    Therapeutic Activities Other Therapeutic Activities    Other Therapeutic Activities use of [place] lift assist to work on standing posture and tolerance. 1 minute 13.87 seconds, 58.56 sec's. 1 minute 07:47 sec's, 1 minute 33.31 sec's.  verbal/tactile cues for hip/knee extension and upright trunk. facilitation provided at pelvis as well. PT with heavy UE reliance and limited in tall standing due to unable to fully extend elbows with Steady bar height. will try parallel bars next session.      Knee/Hip Exercises: Aerobic   Nustep Completed NuStep with BLE/BUE on Level 4 x 8 minutes for improved strength/activity tolerance.  PT Short Term Goals - 12/27/20 1700       PT SHORT TERM GOAL #1   Title Patient will be independent with initial HEP for ROM/Strength (ALL STGs Due: 12/27/20)    Baseline no HEP established    Status Achieved      PT SHORT TERM GOAL #2   Title Patient will be able to stand with BUE support at sink or with RW for >/= 1 minute, with CGA for improved tolerance for functional activities    Baseline 45 seconds    Status Achieved               PT Long Term Goals - 11/27/20 1651       PT LONG TERM GOAL #1   Title Patient will be independent with Final HEP for strengthening/ROM/balance (ALL LTGs:    Baseline no HEP established    Time 8    Period Weeks    Status New    Target  Date 01/17/21      PT LONG TERM GOAL #2   Title Patient will be able to stand with BUE support at countertop or RW for >/= 3 minutes for improved standing tolerance with functional tasks    Baseline 45 seconds    Time 8    Period Weeks    Status New      PT LONG TERM GOAL #3   Title Patient will be able to ambulate x 10 ft with RW and Min A from PT for short household distances    Baseline TBA    Time 8    Period Weeks    Status New      PT LONG TERM GOAL #4   Title Patient will undergo further w/c assesment to determine need for power mobility    Baseline TBA    Time 8    Period Weeks    Status New                   Plan - 01/08/21 1407     Clinical Impression Statement Today's skilled session continued to focus on strengthening and standing tolerance/posture in [place] lift assist. Pt limited in ability to extend UE's in [facility] for increased support and more upright posture. Will attempt standing in parallel bars with knees blocked and use of pad at pelvis to promote hip extension at next session to see if this improves pt's standing posture and tolerance. The pt is making progress and should benefit from continued PT to progress toward unmet goals.    Personal Factors and Comorbidities Comorbidity 3+;Time since onset of injury/illness/exacerbation;Age    Comorbidities Carpal Tunnel, DM, Multiple Sclerosis, Hx of T10 SCI (2013), HTN    Examination-Activity Limitations Bed Mobility;Locomotion Level;Reach Overhead;Transfers;Toileting;Stand;Bathing;Sit;Dressing;Hygiene/Grooming    Examination-Participation Restrictions Interpersonal Relationship;Community Activity;Cleaning    Rehab Potential Good    PT Frequency 2x / week    PT Duration 8 weeks    PT Treatment/Interventions ADLs/Self Care Home Management;Moist Heat;Cryotherapy;DME Instruction;Gait training;Stair training;Functional mobility training;Therapeutic activities;Therapeutic exercise;Balance training;Neuromuscular  re-education;Patient/family education;Orthotic Fit/Training;Wheelchair mobility training;Manual techniques;Vestibular;Dry needling;Passive range of motion    PT Next Visit Plan continue to work on LE stretching (especially hips and quads), adding strengthening in as able and isometric strengthening; try standing in parallel bars with knees blocked and pad used at pelvis    PT Home Exercise Plan Access Code: [email or web address]    Consulted and Agree with Plan of Care Patient  Patient will benefit from skilled therapeutic intervention in order to improve the following deficits and impairments:  Impaired tone, Postural dysfunction, Decreased mobility, Decreased activity tolerance, Decreased endurance, Decreased range of motion, Decreased strength, Impaired flexibility, Difficulty walking, Decreased balance, Abnormal gait, Impaired sensation  Visit Diagnosis: Muscle weakness (generalized)  Abnormal posture  Other abnormalities of gait and mobility     Problem List Patient Active Problem List   Diagnosis Date Noted   Bilateral leg edema 02/09/2020   Elevated LDL cholesterol level 11/09/2019   Multiple subsegmental pulmonary emboli without acute cor pulmonale ([place]) 11/01/2019   Pressure injury of skin 09/25/2019   Benign prostatic hyperplasia with lower urinary tract symptoms 09/14/2019   Cervical stenosis of spinal canal 05/09/2019   Thoracic spinal stenosis 05/09/2019   Gait abnormality 08/08/2018   Spinal stenosis 08/08/2018   Iron deficiency anemia 02/09/2018   Bowel incontinence 01/01/2017   Vitamin D deficiency 01/11/2016   GERD (gastroesophageal reflux disease) 01/11/2016   Spinal stenosis, lumbar region, with neurogenic claudication 01/10/2016   Spinal stenosis of lumbar region with radiculopathy 01/10/2016   Elective surgery    Post-operative pain    Tachycardia    Peripheral neuropathy    Spondylolisthesis of lumbar region 12/30/2015   CTS (carpal tunnel syndrome)  09/26/2015   Lumbar stenosis 09/26/2015   Weakness 09/05/2015   Spinal stenosis in cervical region 06/12/2015   Leukocytopenia 05/22/2013   History of spinal cord injury 12/12/2011   Paraplegia ([place]) 12/11/2011   Altered mental status 12/11/2011   DM (diabetes mellitus) ([place]) 12/11/2011   Overactive bladder 12/11/2011   MS (multiple sclerosis) ([place])    Hypertension     Willow Ora, PTA, [facility] [address], [place], [place] [address] [phone] 01/09/21, 2:24 PM   Name: Hoye Vandermeer MRN: [id number] Date of Birth: 01/11/1951

## 2021-01-09 NOTE — Assessment & Plan Note (Signed)
Controlled with metformin BP and LDL at goal Current use of statin Has upcoming eye exam. Positive neuropathy, stable with lyrica and meloxicam  Repeat BMP, direct LDL, and HgbA1c Maintain current medications

## 2021-01-09 NOTE — Patient Instructions (Signed)
Go to lab for blood and urine collection.  Maintain current medications

## 2021-01-09 NOTE — Assessment & Plan Note (Signed)
BP at goal with olmesartan, amlodipine and hctz LE edema without use of compression stocking BP Readings from Last 3 Encounters:  01/09/21 130/64  07/31/20 120/76  05/02/20 126/72   Advised to resume use of compression stocking. Check BMP Maintain current medication dose

## 2021-01-09 NOTE — Progress Notes (Signed)
Subjective:  Patient ID: Larry [name], male    DOB: Jul 04, 1950  Age: 70 y.o. MRN: [id number]  CC: Follow-up (6 month f/u on DM, HTN, and Cholesterol. /Pt would like refill on Meloxicam/Pt has slight pain in his hands and would like to discuss treatment options. /Pt declines recommended vaccines at this time (Tdap/Shingles))  HPI  Hypertension BP at goal with olmesartan, amlodipine and hctz LE edema without use of compression stocking BP Readings from Last 3 Encounters:  01/09/21 130/64  07/31/20 120/76  05/02/20 126/72   Advised to resume use of compression stocking. Check BMP Maintain current medication dose  DM (diabetes mellitus) ([place]) Controlled with metformin BP and LDL at goal Current use of statin Has upcoming eye exam. Positive neuropathy, stable with lyrica and meloxicam  Repeat BMP, direct LDL, and HgbA1c Maintain current medications  Wt Readings from Last 3 Encounters:  01/09/21 169 lb 6.4 oz (76.8 kg)  07/31/20 167 lb (75.8 kg)  05/02/20 155 lb (70.3 kg)    Reviewed past Medical, Social and Family history today.  Outpatient Medications Prior to Visit  Medication Sig Dispense Refill   ACCU-CHEK AVIVA PLUS test strip TEST ONCE DAILY (IN THE   MORNING, FASTING) 100 strip 3   Accu-Chek FastClix Lancets MISC      aspirin 81 MG tablet Take 81 mg by mouth daily.     atorvastatin (LIPITOR) 20 MG tablet Take 1 tablet (20 mg total) by mouth 3 (three) times a week. 45 tablet 3   baclofen (LIORESAL) 20 MG tablet TAKE 1 TABLET BY MOUTH 3  TIMES DAILY 270 tablet 3   Blood Glucose Calibration (ACCU-CHEK AVIVA) SOLN      cloNIDine (CATAPRES) 0.1 MG tablet Weaning off: 1tab daily x1week, then 1tab EOD x 1week, then stop 180 tablet 3   ferrous sulfate 325 (65 FE) MG tablet Take 1 tablet (325 mg total) by mouth daily with breakfast. 90 tablet 3   finasteride (PROSCAR) 5 MG tablet TAKE 1 TABLET BY MOUTH  DAILY 90 tablet 3   Incontinence Supply Disposable (DEPEND ADJUSTABLE  UNDERWEAR LG) MISC Use as needed. 30 each 12   Lancets Misc. (ACCU-CHEK FASTCLIX LANCET) KIT 1 Units by Does not apply route daily before breakfast. 1 kit 11   meloxicam (MOBIC) 15 MG tablet Take 1 tablet (15 mg total) by mouth daily as needed. for pain. Please make yearly follow up for continued refills or may ask PCP to manage this med. 90 tablet 0   metFORMIN (GLUCOPHAGE XR) 750 MG 24 hr tablet Take 1 tablet (750 mg total) by mouth daily with breakfast. 90 tablet 3   mirabegron ER (MYRBETRIQ) 25 MG TB24 tablet Take 1 tablet (25 mg total) by mouth daily. 90 tablet 3   Multiple Vitamin (MULTIVITAMIN) tablet Take 1 tablet by mouth daily.     Olmesartan-amLODIPine-HCTZ 40-10-12.5 MG TABS TAKE 1 TABLET BY MOUTH  DAILY 90 tablet 0   pregabalin (LYRICA) 100 MG capsule Take 1 capsule (100 mg total) by mouth 2 (two) times daily. 60 capsule 5   traZODone (DESYREL) 50 MG tablet Take 1 tablet (50 mg total) by mouth at bedtime. 90 tablet 3   No facility-administered medications prior to visit.    ROS See HPI  Objective:  BP 130/64 (BP Location: Left Arm, Patient Position: Sitting, Cuff Size: Large)   Pulse 78   Temp 97.8 F (36.6 C) (Temporal)   Wt 169 lb 6.4 oz (76.8 kg)   SpO2 98%  BMI 26.53 kg/m   Physical Exam Vitals reviewed.  Cardiovascular:     Rate and Rhythm: Normal rate and regular rhythm.     Pulses: Normal pulses.     Heart sounds: Normal heart sounds.  Pulmonary:     Effort: Pulmonary effort is normal.     Breath sounds: Normal breath sounds.  Musculoskeletal:     Right lower leg: Edema present.     Left lower leg: Edema present.  Neurological:     Mental Status: He is alert and oriented to person, place, and time.  Psychiatric:        Mood and Affect: Mood normal.        Behavior: Behavior normal.        Thought Content: Thought content normal.   Assessment & Plan:  This visit occurred during the SARS-CoV-2 public health emergency.  Safety protocols were in place,  including screening questions prior to the visit, additional usage of staff PPE, and extensive cleaning of exam room while observing appropriate contact time as indicated for disinfecting solutions.   Ronel was seen today for follow-up.  Diagnoses and all orders for this visit:  Primary hypertension -     Basic metabolic panel  Type 2 diabetes mellitus with diabetic polyneuropathy, without long-term current use of insulin (HCC) -     Hemoglobin A1c -     Hepatic function panel -     Basic metabolic panel -     Microalbumin / creatinine urine ratio  Elevated LDL cholesterol level -     Hepatic function panel -     Direct LDL  Iron deficiency anemia, unspecified iron deficiency anemia type -     CBC with Differential/Platelet -     Iron, TIBC and Ferritin Panel  Benign prostatic hyperplasia with urinary frequency -     PSA  Problem List Items Addressed This Visit       Cardiovascular and Mediastinum   Hypertension - Primary    BP at goal with olmesartan, amlodipine and hctz LE edema without use of compression stocking BP Readings from Last 3 Encounters:  01/09/21 130/64  07/31/20 120/76  05/02/20 126/72   Advised to resume use of compression stocking. Check BMP Maintain current medication dose      Relevant Orders   Basic metabolic panel     Endocrine   DM (diabetes mellitus) ([place])    Controlled with metformin BP and LDL at goal Current use of statin Has upcoming eye exam. Positive neuropathy, stable with lyrica and meloxicam  Repeat BMP, direct LDL, and HgbA1c Maintain current medications      Relevant Orders   Hemoglobin A1c   Hepatic function panel   Basic metabolic panel   Microalbumin / creatinine urine ratio     Genitourinary   Benign prostatic hyperplasia with lower urinary tract symptoms   Relevant Orders   PSA     Other   Elevated LDL cholesterol level   Relevant Orders   Hepatic function panel   Direct LDL   Iron deficiency anemia    Relevant Orders   CBC with Differential/Platelet   Iron, TIBC and Ferritin Panel    Follow-up: Return in about 6 months (around 07/12/2021) for DM and HTN, hyperlipidemia (22mns, fasting).  CWilfred Lacy NP

## 2021-01-10 ENCOUNTER — Other Ambulatory Visit: Payer: Self-pay

## 2021-01-10 ENCOUNTER — Telehealth: Payer: Self-pay

## 2021-01-10 ENCOUNTER — Ambulatory Visit: Payer: Medicare Other | Admitting: Physical Therapy

## 2021-01-10 DIAGNOSIS — M6281 Muscle weakness (generalized): Secondary | ICD-10-CM | POA: Diagnosis not present

## 2021-01-10 DIAGNOSIS — R2689 Other abnormalities of gait and mobility: Secondary | ICD-10-CM

## 2021-01-10 DIAGNOSIS — R293 Abnormal posture: Secondary | ICD-10-CM | POA: Diagnosis not present

## 2021-01-10 NOTE — Therapy (Signed)
[facility] [address] [place], [place], [address] Phone: [phone]   Fax:  [phone]  Physical Therapy Treatment  Patient Details  Name: Larry [name] MRN: [id number] Date of Birth: [birth year] Referring Provider (PT): Wilfred Lacy, NP   Encounter Date: 01/10/2021   PT End of Session - 01/10/21 1606     Visit Number 11    Number of Visits 17    Date for PT Re-Evaluation 01/17/21    Authorization Type UHC Medicare (10th Visit PN)    Progress Note Due on Visit 20    PT Start Time 1402    PT Stop Time 1445   - 10 minutes in bathroom during session   PT Time Calculation (min) 43 min    Equipment Utilized During Treatment Gait belt    Activity Tolerance Patient tolerated treatment well    Behavior During Therapy [facility] for tasks assessed/performed             Past Medical History:  Diagnosis Date   Abnormality of gait    Acute delirium 12/11/2011   Acute respiratory failure due to COVID-19 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   Subjective Assessment - 01/10/21 1406     Subjective No new complaints. No falls or pain to report. Did feel he was able to stand up taller in the shower, however knees buckle after a few seconds.    Pertinent History Carpal Tunnel, DM, Multiple Sclerosis (diagnosed in 1996), Hx of T10 SCI (2013), HTN    Limitations Walking;Standing;House hold activities    Patient Stated Goals Strengthen up the legs    Currently in Pain? No/denies                        [facility] Adult PT Treatment/Exercise - 01/10/21 1407       Transfers   Transfers Stand to Sit;Lateral/Scoot Transfers    Sit to Stand 4: Min guard    Stand to Sit 4: Min guard;4: Min assist    Lateral/Scoot Transfers 6: Modified independent (Device/Increase time)    Lateral/Scoot Transfer Details (indicate cue type and reason) wheelchair<>Nustep      Therapeutic Activites    Therapeutic Activities Other Therapeutic Activities    Other Therapeutic Activities standing in parallel bars with bil UE support, lift pad used at pelvis to facilitate hip extension, PTA knees in front of pt's knees for blocking as needed. cues for pt to "tuck butt" to pull hips forward and to bring knees off PTA knees. pt able to activate muscles to do both these tasks throughout all stands. Standing times as follows- 2 minutes 36.28 sec's, 2 minutes 35.03 sec's and 2 minutes 29.97 sec's. Min assist for standing balance with moments of min guard assist.      Knee/Hip Exercises: Aerobic   Nustep Completed NuStep with BLE/BUE on Level 5 x 8 minutes with goal >/= 60 steps per minute for improved  strength/activity tolerance.                      PT Short Term Goals - 12/27/20 1700       PT SHORT TERM GOAL #1   Title Patient will be independent with initial HEP for ROM/Strength (ALL STGs Due: 12/27/20)    Baseline no HEP established    Status Achieved      PT SHORT TERM GOAL #2   Title Patient will be able to stand with BUE support at sink or with RW for >/= 1 minute, with CGA for improved tolerance for functional activities    Baseline 45 seconds    Status Achieved               PT Long Term Goals - 11/27/20 1651       PT LONG TERM GOAL #1   Title Patient will be independent with Final HEP for strengthening/ROM/balance (ALL LTGs:    Baseline no HEP established    Time 8    Period Weeks    Status New    Target Date 01/17/21      PT LONG TERM GOAL #2   Title  Patient will be able to stand with BUE support at countertop or RW for >/= 3 minutes for improved standing tolerance with functional tasks    Baseline 45 seconds    Time 8    Period Weeks    Status New      PT LONG TERM GOAL #3   Title Patient will be able to ambulate x 10 ft with RW and Min A from PT for short household distances    Baseline TBA    Time 8    Period Weeks    Status New      PT LONG TERM GOAL #4   Title Patient will undergo further w/c assesment to determine need for power mobility    Baseline TBA    Time 8    Period Weeks    Status New                   Plan - 01/10/21 1607     Clinical Impression Statement Today's skilled session continued with use of Nustep for strengthening with no issues with increased resistance this session. Remainder of session (after pt took a bathroom break) focused on standing with up of parallel bars. Pt able to double his standing time from ~1.5 minutes to ~2.5 minutes give or take a few seconds. Pt also able to activate LE/hip muscles to achieve hip extension and knee extension for brief periods during standing. The pt made good progress with standing posture and tolerance this session. The pt should benefit from continued PT to progress toward unmet goals.    Personal Factors and Comorbidities Comorbidity 3+;Time since onset of injury/illness/exacerbation;Age    Comorbidities Carpal Tunnel, DM, Multiple Sclerosis, Hx of T10 SCI (2013), HTN    Examination-Activity Limitations Bed Mobility;Locomotion Level;Reach Overhead;Transfers;Toileting;Stand;Bathing;Sit;Dressing;Hygiene/Grooming    Examination-Participation Restrictions Interpersonal Relationship;Community Activity;Cleaning    Rehab Potential Good    PT Frequency 2x / week    PT Duration 8 weeks    PT Treatment/Interventions ADLs/Self Care Home Management;Moist Heat;Cryotherapy;DME Instruction;Gait training;Stair training;Functional mobility training;Therapeutic  activities;Therapeutic exercise;Balance training;Neuromuscular re-education;Patient/family education;Orthotic Fit/Training;Wheelchair mobility training;Manual techniques;Vestibular;Dry needling;Passive range of motion    PT Next Visit Plan continue with Nustep, LE stretching and standing in parallel bars. Goals due [id number] ?  recert or discharge, will discuss with primary  PT who see's pt on last scheduled visit    PT Home Exercise Plan Access Code: [email or web address]    Consulted and Agree with Plan of Care Patient             Patient will benefit from skilled therapeutic intervention in order to improve the following deficits and impairments:  Impaired tone, Postural dysfunction, Decreased mobility, Decreased activity tolerance, Decreased endurance, Decreased range of motion, Decreased strength, Impaired flexibility, Difficulty walking, Decreased balance, Abnormal gait, Impaired sensation  Visit Diagnosis: Muscle weakness (generalized)  Abnormal posture  Other abnormalities of gait and mobility     Problem List Patient Active Problem List   Diagnosis Date Noted   Bilateral leg edema 02/09/2020   Elevated LDL cholesterol level 11/09/2019   Multiple subsegmental pulmonary emboli without acute cor pulmonale ([place]) 11/01/2019   Pressure injury of skin 09/25/2019   Benign prostatic hyperplasia with lower urinary tract symptoms 09/14/2019   Cervical stenosis of spinal canal 05/09/2019   Thoracic spinal stenosis 05/09/2019   Gait abnormality 08/08/2018   Spinal stenosis 08/08/2018   Iron deficiency anemia 02/09/2018   Bowel incontinence 01/01/2017   Vitamin D deficiency 01/11/2016   GERD (gastroesophageal reflux disease) 01/11/2016   Spinal stenosis, lumbar region, with neurogenic claudication 01/10/2016   Spinal stenosis of lumbar region with radiculopathy 01/10/2016   Elective surgery    Post-operative pain    Tachycardia    Peripheral neuropathy    Spondylolisthesis of lumbar region  12/30/2015   CTS (carpal tunnel syndrome) 09/26/2015   Lumbar stenosis 09/26/2015   Weakness 09/05/2015   Spinal stenosis in cervical region 06/12/2015   Leukocytopenia 05/22/2013   History of spinal cord injury 12/12/2011   Paraplegia ([place]) 12/11/2011   Altered mental status 12/11/2011   DM (diabetes mellitus) ([place]) 12/11/2011   Overactive bladder 12/11/2011   MS (multiple sclerosis) ([place])    Hypertension     Willow Ora, PTA, [facility] [address] [place], [place] [address] [phone] 01/10/21, 4:24 PM   Name: Fleet Riggan MRN: [id number] Date of Birth: 02/28/1951

## 2021-01-10 NOTE — Progress Notes (Signed)
Left Voice message to confirmed patient telephone appointment on 01/13/2021 for CCM at 10:00 am with Junius Argyle the Clinical pharmacist.   [place] Pharmacist Assistant [phone]

## 2021-01-11 LAB — IRON,TIBC AND FERRITIN PANEL
%SAT: 23 % (calc) (ref 20–48)
Ferritin: 29 ng/mL (ref 24–380)
Iron: 75 ug/dL (ref 50–180)
TIBC: 322 mcg/dL (calc) (ref 250–425)

## 2021-01-13 ENCOUNTER — Ambulatory Visit (INDEPENDENT_AMBULATORY_CARE_PROVIDER_SITE_OTHER): Payer: Medicare Other

## 2021-01-13 DIAGNOSIS — E1169 Type 2 diabetes mellitus with other specified complication: Secondary | ICD-10-CM

## 2021-01-13 DIAGNOSIS — E785 Hyperlipidemia, unspecified: Secondary | ICD-10-CM

## 2021-01-13 DIAGNOSIS — E1142 Type 2 diabetes mellitus with diabetic polyneuropathy: Secondary | ICD-10-CM

## 2021-01-13 DIAGNOSIS — I152 Hypertension secondary to endocrine disorders: Secondary | ICD-10-CM

## 2021-01-13 DIAGNOSIS — E1159 Type 2 diabetes mellitus with other circulatory complications: Secondary | ICD-10-CM

## 2021-01-13 NOTE — Progress Notes (Signed)
Chronic Care Management Pharmacy Note  01/13/2021 Name:  Larry [name] MRN:  [id number] DOB:  [birth year]  Summary: Patient presents for CCM follow-up. He reports doing well today. He has been following closely with PT, which finishes next week. He plans to continue with recommended stretches and exercises once he is finished. He does not monitor blood sugar or blood pressure regularly, but when he does check they are "good." Discussed importance of maintaining blood sugar control to minimize risk of neuropathy.   Recommendations/Changes made from today's visit: Continue current medications  Plan: CPP follow-up 6 months   Subjective: Larry [name] is an 70 y.o. year old male who is a primary patient of Nche, Charlene Brooke, NP.  The CCM team was consulted for assistance with disease management and care coordination needs.    Engaged with patient by telephone for follow up visit in response to provider referral for pharmacy case management and/or care coordination services.   Consent to Services:  The patient was given information about Chronic Care Management services, agreed to services, and gave verbal consent prior to initiation of services.  Please see initial visit note for detailed documentation.   Patient Care Team: Nche, Charlene Brooke, NP as PCP - General (Internal Medicine) Kristeen Miss, MD as Consulting Physician (Neurosurgery) Kristeen Miss, MD as Consulting Physician (Neurosurgery) Germaine Pomfret, [facility] as Pharmacist (Pharmacist)  Recent office visits: 01/09/21: Patient presented to Wilfred Lacy, NP for follow-up.   Recent consult visits: None in previous 6 months  Hospital visits: None in previous 6 months   Objective:  Lab Results  Component Value Date   CREATININE 1.06 01/09/2021   BUN 21 01/09/2021   GFR 70.16 01/09/2021   GFRNONAA >60 09/30/2019   GFRAA >60 09/30/2019   NA 140 01/09/2021   K 3.9 01/09/2021   CALCIUM 10.0 01/09/2021   CO2 30  01/09/2021   GLUCOSE 115 (H) 01/09/2021    Lab Results  Component Value Date/Time   HGBA1C 6.7 (H) 01/09/2021 01:20 PM   HGBA1C 6.0 (A) 05/02/2020 03:09 PM   HGBA1C 6.1 11/01/2019 11:43 AM   GFR 70.16 01/09/2021 01:20 PM   GFR 72.97 05/03/2020 11:11 AM   MICROALBUR 4.7 (H) 07/27/2016 12:57 PM    Last diabetic Eye exam:  Lab Results  Component Value Date/Time   HMDIABEYEEXA No Retinopathy 12/11/2019 12:00 AM    Last diabetic Foot exam: No results found for: HMDIABFOOTEX   Lab Results  Component Value Date   CHOL 176 11/01/2019   HDL 49.50 11/01/2019   LDLCALC 113 (H) 11/01/2019   LDLDIRECT 64.0 01/09/2021   TRIG 69.0 11/01/2019   CHOLHDL 4 11/01/2019    Hepatic Function Latest Ref Rng & Units 01/09/2021 11/01/2019 09/30/2019  Total Protein 6.0 - 8.3 g/dL 6.9 6.4 5.4(L)  Albumin 3.5 - 5.2 g/dL 4.7 4.6 2.9(L)  AST 0 - 37 U/L 28 19 73(H)  ALT 0 - 53 U/L 16 9 48(H)  Alk Phosphatase 39 - 117 U/L 97 66 61  Total Bilirubin 0.2 - 1.2 mg/dL 0.5 0.4 0.7  Bilirubin, Direct 0.0 - 0.3 mg/dL 0.1 - -    Lab Results  Component Value Date/Time   TSH 1.15 07/31/2020 12:21 PM   TSH 1.46 03/24/2019 03:55 PM    CBC Latest Ref Rng & Units 01/09/2021 07/31/2020 05/03/2020  WBC 4.0 - 10.5 K/uL 3.9(L) 4.1 4.8  Hemoglobin 13.0 - 17.0 g/dL 12.8(L) 12.2(L) 12.5(L)  Hematocrit 39.0 - 52.0 % 38.8(L) 36.3(L) 37.4(L)  Platelets 150.0 -  400.0 K/uL 281.0 264.0 254.0    No results found for: VD25OH  Clinical ASCVD: No  The ASCVD Risk score Mikey Bussing DC Jr., et al., 2013) failed to calculate for the following reasons:   Unable to determine if patient is Non-Hispanic African American    Depression screen [facility] 2/9 11/19/2020 07/17/2020 04/09/2020  Decreased Interest 0 0 0  Down, Depressed, Hopeless 0 0 0  PHQ - 2 Score 0 0 0  Some recent data might be hidden    Social History   Tobacco Use  Smoking Status Never  Smokeless Tobacco Never   BP Readings from Last 3 Encounters:  01/09/21 130/64  07/31/20  120/76  05/02/20 126/72   Pulse Readings from Last 3 Encounters:  01/09/21 78  07/31/20 76  05/02/20 78   Wt Readings from Last 3 Encounters:  01/09/21 169 lb 6.4 oz (76.8 kg)  07/31/20 167 lb (75.8 kg)  05/02/20 155 lb (70.3 kg)   BMI Readings from Last 3 Encounters:  01/09/21 26.53 kg/m  07/31/20 26.16 kg/m  05/02/20 24.28 kg/m    Assessment/Interventions: Review of patient past medical history, allergies, medications, health status, including review of consultants reports, laboratory and other test data, was performed as part of comprehensive evaluation and provision of chronic care management services.   SDOH:  (Social Determinants of Health) assessments and interventions performed: Yes SDOH Interventions    Flowsheet Row Most Recent Value  SDOH Interventions   Financial Strain Interventions Intervention Not Indicated      SDOH Screenings   Alcohol Screen: Low Risk    Last Alcohol Screening Score (AUDIT): 0  Depression (PHQ2-9): Low Risk    PHQ-2 Score: 0  Financial Resource Strain: Low Risk    Difficulty of Paying Living Expenses: Not hard at all  Food Insecurity: Not on file  Housing: [place] Risk Score: 0  Physical Activity: Insufficiently Active   Days of Exercise per Week: 4 days   Minutes of Exercise per Session: 30 min  Social Connections: Moderately Isolated   Frequency of Communication with Friends and Family: More than three times a week   Frequency of Social Gatherings with Friends and Family: Once a week   Attends Religious Services: More than 4 times per year   Active Member of [employer] or Organizations: No   Attends Music therapist: Never   Marital Status: Divorced  Stress: No Stress Concern Present   Feeling of Stress : Not at all  Tobacco Use: Low Risk    Smoking Tobacco Use: Never   Smokeless Tobacco Use: Never  Transportation Needs: No Transportation Needs   Lack of Transportation (Medical): No   Lack of  Transportation (Non-Medical): No    CCM Care Plan  Allergies  Allergen Reactions   No Known Allergies    Rosuvastatin Other (See Comments)    Leg swelling    Medications Reviewed Today     Reviewed by Drema Balzarine, PTA (Physical Therapy Assistant) on 01/10/21 at 1405  Med List Status: <None>   Medication Order Taking? Sig Documenting Provider Last Dose Status Informant  ACCU-CHEK AVIVA PLUS test strip [id number] No TEST ONCE DAILY (IN THE   MORNING, FASTING) Nche, Charlene Brooke, NP Taking Active   Accu-Chek FastClix Lancets MISC [id number] No  [provider] Taking Active Self  aspirin 81 MG tablet [id number] No Take 81 mg by mouth daily. [provider] Taking Active Self  atorvastatin (LIPITOR) 20 MG tablet [id number]  No Take 1 tablet (20 mg total) by mouth 3 (three) times a week. Flossie Buffy, NP Taking Active   baclofen (LIORESAL) 20 MG tablet [id number] No TAKE 1 TABLET BY MOUTH 3  TIMES DAILY Marcial Pacas, MD Taking Active Self  Blood Glucose Calibration (ACCU-CHEK AVIVA) SOLN [id number] No  [provider] Taking Active Self  cloNIDine (CATAPRES) 0.1 MG tablet [id number] No Weaning off: 1tab daily x1week, then 1tab EOD x 1week, then stop Nche, Charlene Brooke, NP Taking Active   ferrous sulfate 325 (65 FE) MG tablet [id number] No Take 1 tablet (325 mg total) by mouth daily with breakfast. Nche, Charlene Brooke, NP Taking Active   finasteride (PROSCAR) 5 MG tablet [id number] No TAKE 1 TABLET BY MOUTH  DAILY Nche, Charlene Brooke, NP Taking Active   Incontinence Supply Disposable (DEPEND ADJUSTABLE UNDERWEAR LG) MISC [id number] No Use as needed. Marcial Pacas, MD Taking Active Self  Lancets Misc. (ACCU-CHEK FASTCLIX LANCET) KIT [id number] No 1 Units by Does not apply route daily before breakfast. Nche, Charlene Brooke, NP Taking Active Self  meloxicam (MOBIC) 15 MG tablet [id number] No Take 1 tablet (15 mg total) by mouth daily as needed. for pain. Please make yearly follow  up for continued refills or may ask PCP to manage this med. Marcial Pacas, MD Taking Active   metFORMIN (GLUCOPHAGE XR) 750 MG 24 hr tablet [id number] No Take 1 tablet (750 mg total) by mouth daily with breakfast. Nche, Charlene Brooke, NP Taking Active   mirabegron ER (MYRBETRIQ) 25 MG TB24 tablet [id number] No Take 1 tablet (25 mg total) by mouth daily. Flossie Buffy, NP Taking Active   Multiple Vitamin (MULTIVITAMIN) tablet [id number] No Take 1 tablet by mouth daily. [provider] Taking Active Self  Olmesartan-amLODIPine-HCTZ 40-10-12.5 MG TABS [id number] No TAKE 1 TABLET BY MOUTH  DAILY Nche, Charlene Brooke, NP Taking Active   pregabalin (LYRICA) 100 MG capsule [id number] No Take 1 capsule (100 mg total) by mouth 2 (two) times daily. Flossie Buffy, NP Taking Active   traZODone (DESYREL) 50 MG tablet [id number] No Take 1 tablet (50 mg total) by mouth at bedtime. Flossie Buffy, NP Taking Active Self            Patient Active Problem List   Diagnosis Date Noted   Bilateral leg edema 02/09/2020   Elevated LDL cholesterol level 11/09/2019   Multiple subsegmental pulmonary emboli without acute cor pulmonale (HCC) 11/01/2019   Pressure injury of skin 09/25/2019   Benign prostatic hyperplasia with lower urinary tract symptoms 09/14/2019   Cervical stenosis of spinal canal 05/09/2019   Thoracic spinal stenosis 05/09/2019   Gait abnormality 08/08/2018   Spinal stenosis 08/08/2018   Iron deficiency anemia 02/09/2018   Bowel incontinence 01/01/2017   Vitamin D deficiency 01/11/2016   GERD (gastroesophageal reflux disease) 01/11/2016   Spinal stenosis, lumbar region, with neurogenic claudication 01/10/2016   Spinal stenosis of lumbar region with radiculopathy 01/10/2016   Elective surgery    Post-operative pain    Tachycardia    Peripheral neuropathy    Spondylolisthesis of lumbar region 12/30/2015   CTS (carpal tunnel syndrome) 09/26/2015   Lumbar stenosis 09/26/2015    Weakness 09/05/2015   Spinal stenosis in cervical region 06/12/2015   Leukocytopenia 05/22/2013   History of spinal cord injury 12/12/2011   Paraplegia ([place]) 12/11/2011   Altered mental status 12/11/2011   DM (diabetes mellitus) ([place]) 12/11/2011   Overactive bladder 12/11/2011   MS (multiple sclerosis) ([place])  Hypertension     Immunization History  Administered Date(s) Administered   Fluad Quad(high Dose 65+) 03/24/2019, 05/02/2020   Influenza Split 02/05/2012   Influenza, High Dose Seasonal PF 03/05/2014, 05/17/2015, 02/15/2017, 06/10/2018   Influenza,inj,Quad PF,6+ Mos 05/09/2013   PFIZER(Purple Top)SARS-COV-2 Vaccination 01/14/2020, 02/04/2020   PPD Test 01/05/2016   Pneumococcal Conjugate-13 03/05/2014   Pneumococcal Polysaccharide-23 05/17/2015    Conditions to be addressed/monitored:  Hypertension, Hyperlipidemia, Diabetes, GERD, Overactive Bladder, and BPH  Care Plan : General Pharmacy (Adult)  Updates made by Germaine Pomfret, RPH since 01/13/2021 12:00 AM     Problem: Hypertension, Hyperlipidemia, Diabetes, GERD, Overactive Bladder, and BPH   Priority: High     Long-Range Goal: Patient-Specific Goal   Start Date: 01/13/2021  Expected End Date: 01/13/2022  This Visit's Progress: On track  Priority: High  Note:   Current Barriers:  No Barriers noted  Pharmacist Clinical Goal(s):  Patient will maintain control of blood pressure as evidenced by BP less than 140/90  through collaboration with PharmD and provider.   Interventions: 1:1 collaboration with Nche, Charlene Brooke, NP regarding development and update of comprehensive plan of care as evidenced by provider attestation and co-signature Inter-disciplinary care team collaboration (see longitudinal plan of care) Comprehensive medication review performed; medication list updated in electronic medical record  Hypertension (BP goal <140/90) -Controlled -Current treatment: Olmesartan-Amlodipine-HCTZ  40-10-12.5 mg daily  -Medications previously tried: NA  -Current home readings: Does not routinely monitor at home, home health recently came to his home, informed him his blood pressure was "good" -Current dietary habits: Limits salt intake. -Current exercise habits: PT multiple times weekly -Denies hypotensive/hypertensive symptoms -Counseled to monitor BP at home weekly, document, and provide log at future appointments -Recommended to continue current medication  Hyperlipidemia: (LDL goal < 100) -Controlled -Current treatment: Atorvastatin 20 mg three times weekly -Medications previously tried: NA  -Educated on Benefits of statin for ASCVD risk reduction; -Recommended to continue current medication  Diabetes (A1c goal <7%) -Controlled -Current medications: Metformin XR 750 mg daily  -Medications previously tried: NA  -Current home glucose readings fasting glucose: Does not monitor routinely -Denies hypoglycemic/hyperglycemic symptoms -Educated on A1c and blood sugar goals; -Counseled to check feet daily and get yearly eye exams -Recommended to continue current medication  Patient Goals/Self-Care Activities Patient will:  - check glucose weekly before breakfast, document, and provide at future appointments check blood pressure weekly, document, and provide at future appointments  Follow Up Plan: Telephone follow up appointment with care management team member scheduled for:  07/03/2021 at 10:00 AM      Medication Assistance: None required.  Patient affirms current coverage meets needs.  Compliance/Adherence/Medication fill history: Care Gaps: Covid Booster Opthalmology Exam Influenza  Star-Rating Drugs: Atorvastatin LF 09/23/20 for 90-DS Olmesartan LF 11/27/20 for 90-DS Metformin LF 10/17/20 for 90-DS  Patient's preferred pharmacy is:  [facility]  ([place]) - [place], [address] [place] [address] [place]  [address] Phone: [phone] Fax: [phone]  Uses pill box? Yes Pt endorses 100% compliance  We discussed: Current pharmacy is preferred with insurance plan and patient is satisfied with pharmacy services Patient decided to: Continue current medication management strategy  Care Plan and Follow Up Patient Decision:  Patient agrees to Care Plan and Follow-up.  Plan: Telephone follow up appointment with care management team member scheduled for:  07/03/2021 at 10:00 AM  Junius Argyle, PharmD, BCACP, CPP Clinical Pharmacist [facility] at  [employer]  [phone]

## 2021-01-13 NOTE — Patient Instructions (Signed)
Visit Information It was great speaking with you today!  Please let me know if you have any questions about our visit.   Goals Addressed             This Visit's Progress    Track and Manage My Blood Pressure-Hypertension       Timeframe:  Long-Range Goal Priority:  High Start Date:  01/13/2021                            Expected End Date:  01/13/2022                     Follow Up Date 03/30/2021    - check blood pressure weekly    Why is this important?   You won't feel high blood pressure, but it can still hurt your blood vessels.  High blood pressure can cause heart or kidney problems. It can also cause a stroke.  Making lifestyle changes like losing a little weight or eating less salt will help.  Checking your blood pressure at home and at different times of the day can help to control blood pressure.  If the doctor prescribes medicine remember to take it the way the doctor ordered.  Call the office if you cannot afford the medicine or if there are questions about it.     Notes:         Patient Care Plan: General Pharmacy (Adult)     Problem Identified: Hypertension, Hyperlipidemia, Diabetes, GERD, Overactive Bladder, and BPH   Priority: High     Long-Range Goal: Patient-Specific Goal   Start Date: 01/13/2021  Expected End Date: 01/13/2022  This Visit's Progress: On track  Priority: High  Note:   Current Barriers:  No Barriers noted  Pharmacist Clinical Goal(s):  Patient will maintain control of blood pressure as evidenced by BP less than 140/90  through collaboration with PharmD and provider.   Interventions: 1:1 collaboration with Nche, Charlene Brooke, NP regarding development and update of comprehensive plan of care as evidenced by provider attestation and co-signature Inter-disciplinary care team collaboration (see longitudinal plan of care) Comprehensive medication review performed; medication list updated in electronic medical record  Hypertension (BP  goal <140/90) -Controlled -Current treatment: Olmesartan-Amlodipine-HCTZ 40-10-12.5 mg daily  -Medications previously tried: NA  -Current home readings: Does not routinely monitor at home, home health recently came to his home, informed him his blood pressure was "good" -Current dietary habits: Limits salt intake. -Current exercise habits: PT multiple times weekly -Denies hypotensive/hypertensive symptoms -Counseled to monitor BP at home weekly, document, and provide log at future appointments -Recommended to continue current medication  Hyperlipidemia: (LDL goal < 100) -Controlled -Current treatment: Atorvastatin 20 mg three times weekly -Medications previously tried: NA  -Educated on Benefits of statin for ASCVD risk reduction; -Recommended to continue current medication  Diabetes (A1c goal <7%) -Controlled -Current medications: Metformin XR 750 mg daily  -Medications previously tried: NA  -Current home glucose readings fasting glucose: Does not monitor routinely -Denies hypoglycemic/hyperglycemic symptoms -Educated on A1c and blood sugar goals; -Counseled to check feet daily and get yearly eye exams -Recommended to continue current medication  Patient Goals/Self-Care Activities Patient will:  - check glucose weekly before breakfast, document, and provide at future appointments check blood pressure weekly, document, and provide at future appointments  Follow Up Plan: Telephone follow up appointment with care management team member scheduled for:  07/03/2021 at 10:00 AM  Patient agreed to services and verbal consent obtained.   Patient verbalizes understanding of instructions provided today and agrees to view in [place].   Junius Argyle, PharmD, [place], CPP Clinical Pharmacist [facility] at [facility]  [phone]

## 2021-01-15 ENCOUNTER — Ambulatory Visit: Payer: Medicare Other | Admitting: Physical Therapy

## 2021-01-15 ENCOUNTER — Other Ambulatory Visit: Payer: Self-pay

## 2021-01-15 DIAGNOSIS — M6281 Muscle weakness (generalized): Secondary | ICD-10-CM

## 2021-01-15 DIAGNOSIS — R2689 Other abnormalities of gait and mobility: Secondary | ICD-10-CM

## 2021-01-15 DIAGNOSIS — R293 Abnormal posture: Secondary | ICD-10-CM | POA: Diagnosis not present

## 2021-01-16 NOTE — Therapy (Signed)
[facility] [address] [place], [place], [address] Phone: [phone]   Fax:  [phone]  Physical Therapy Treatment  Patient Details  Name: Larry [name] MRN: [id number] Date of Birth: Oct 23, 1950 Referring Provider (PT): Wilfred Lacy, NP   Encounter Date: 01/15/2021   PT End of Session - 01/15/21 1630     Visit Number 12    Number of Visits 17    Date for PT Re-Evaluation 01/17/21    Authorization Type U[employer] Medicare (10th Visit PN)    Progress Note Due on Visit 20    PT Start Time 1403    PT Stop Time 1445   -10 minute for bathroom break during session- so charged 32 minutes   PT Time Calculation (min) 42 min    Equipment Utilized During Treatment Gait belt    Activity Tolerance Patient tolerated treatment well    Behavior During Therapy [facility] for tasks assessed/performed             Past Medical History:  Diagnosis Date   Abnormality of gait    Acute delirium 12/11/2011   Acute respiratory failure due to COVID-19 [facility]) 09/23/2019   CTS (carpal tunnel syndrome) 09/26/2015   Diabetes mellitus    Hypercholesterolemia    Hypertension    Kidney stones    Leukocytopenia 05/22/2013   MS (multiple sclerosis) ([place])    sees [facility]   T10 spinal cord injury ([place]) 12/12/2011   hx surgery 1996 with bilat gait abnormality since    Past Surgical History:  Procedure Laterality Date   BACK SURGERY     COLONOSCOPY     LUMBAR LAMINECTOMY      There were no vitals filed for this visit.   Subjective Assessment - 01/15/21 1408     Subjective Was very sore after last session starting on Saturday until today (still a little sore today). Sore in both arms, lats, shoulders and upper back. Did not feel anything in the legs. Did slide of the edge of the bed on Sunday as he was doing his normal routine, however his left arm was sore with shoulder pain and did not support him as it usually does. With some effort,  he was able to get himself back up using his arms to pull himself up.    Pertinent History Carpal Tunnel, DM, Multiple Sclerosis (diagnosed in 1996), Hx of T10 SCI (2013), HTN    Limitations Walking;Standing;House hold activities                   [facility] Adult PT Treatment/Exercise - 01/15/21 1412       Transfers   Transfers Lateral/Scoot Transfers    Lateral/Scoot Transfers 6: Modified independent (Device/Increase time)    Lateral/Scoot Transfer Details (indicate cue type and reason) wheelchair<>Nustep, then wheelchair<>mat table      Knee/Hip Exercises: Aerobic   Nustep Completed NuStep with BLE/BUE on Level 4 x 8 minutes with goal >/= 60 steps per minute for improved strength/activity tolerance.      Knee/Hip Exercises: Supine   Bridges AROM;AAROM;Strengthening;Both;1 set;10 reps;Limitations    Bridges Limitations assist needed to stabilize knees with minimal pelvic lift, did not clear mat surface. good posterior pelvic tilt noted with each rep.    Other Supine Knee/Hip Exercises with legs extended and feet on pillow: pt pusing feet into ER against PTA resistance for isometric strengthening for 5 sec's x 10 reps; then had pt perform quad sets concurrent with glut sets for 5  sec holds for 10 reps.                  PT Short Term Goals - 12/27/20 1700       PT SHORT TERM GOAL #1   Title Patient will be independent with initial HEP for ROM/Strength (ALL STGs Due: 12/27/20)    Baseline no HEP established    Status Achieved      PT SHORT TERM GOAL #2   Title Patient will be able to stand with BUE support at sink or with RW for >/= 1 minute, with CGA for improved tolerance for functional activities    Baseline 45 seconds    Status Achieved               PT Long Term Goals - 11/27/20 1651       PT LONG TERM GOAL #1   Title Patient will be independent with Final HEP for strengthening/ROM/balance (ALL LTGs:    Baseline no HEP established    Time 8    Period  Weeks    Status New    Target Date 01/17/21      PT LONG TERM GOAL #2   Title Patient will be able to stand with BUE support at countertop or RW for >/= 3 minutes for improved standing tolerance with functional tasks    Baseline 45 seconds    Time 8    Period Weeks    Status New      PT LONG TERM GOAL #3   Title Patient will be able to ambulate x 10 ft with RW and Min A from PT for short household distances    Baseline TBA    Time 8    Period Weeks    Status New      PT LONG TERM GOAL #4   Title Patient will undergo further w/c assesment to determine need for power mobility    Baseline TBA    Time 8    Period Weeks    Status New                   Plan - 01/15/21 1630     Clinical Impression Statement Today's skilled session focused on gentle strengthening due to increased pain after last session. Decreased resistance on Nustep as well as it was increased last session and may have contributed to pt's increased pain. No issues noted or reported in session.    Personal Factors and Comorbidities Comorbidity 3+;Time since onset of injury/illness/exacerbation;Age    Comorbidities Carpal Tunnel, DM, Multiple Sclerosis, Hx of T10 SCI (2013), HTN    Examination-Activity Limitations Bed Mobility;Locomotion Level;Reach Overhead;Transfers;Toileting;Stand;Bathing;Sit;Dressing;Hygiene/Grooming    Examination-Participation Restrictions Interpersonal Relationship;Community Activity;Cleaning    Rehab Potential Good    PT Frequency 2x / week    PT Duration 8 weeks    PT Treatment/Interventions ADLs/Self Care Home Management;Moist Heat;Cryotherapy;DME Instruction;Gait training;Stair training;Functional mobility training;Therapeutic activities;Therapeutic exercise;Balance training;Neuromuscular re-education;Patient/family education;Orthotic Fit/Training;Wheelchair mobility training;Manual techniques;Vestibular;Dry needling;Passive range of motion    PT Next Visit Plan Goals due [id number] ?   recert or discharge, will discuss with primary PT who see's pt on last scheduled visit- leaning towards discharge after discussion with primary PT    PT Home Exercise Plan Access Code: [email or web address]    Consulted and Agree with Plan of Care Patient             Patient will benefit from skilled therapeutic intervention in order to improve the following deficits and impairments:  Impaired  tone, Postural dysfunction, Decreased mobility, Decreased activity tolerance, Decreased endurance, Decreased range of motion, Decreased strength, Impaired flexibility, Difficulty walking, Decreased balance, Abnormal gait, Impaired sensation  Visit Diagnosis: Muscle weakness (generalized)  Abnormal posture  Other abnormalities of gait and mobility     Problem List Patient Active Problem List   Diagnosis Date Noted   Bilateral leg edema 02/09/2020   Elevated LDL cholesterol level 11/09/2019   Multiple subsegmental pulmonary emboli without acute cor pulmonale ([place]) 11/01/2019   Pressure injury of skin 09/25/2019   Benign prostatic hyperplasia with lower urinary tract symptoms 09/14/2019   Cervical stenosis of spinal canal 05/09/2019   Thoracic spinal stenosis 05/09/2019   Gait abnormality 08/08/2018   Spinal stenosis 08/08/2018   Iron deficiency anemia 02/09/2018   Bowel incontinence 01/01/2017   Vitamin D deficiency 01/11/2016   GERD (gastroesophageal reflux disease) 01/11/2016   Spinal stenosis, lumbar region, with neurogenic claudication 01/10/2016   Spinal stenosis of lumbar region with radiculopathy 01/10/2016   Elective surgery    Post-operative pain    Tachycardia    Peripheral neuropathy    Spondylolisthesis of lumbar region 12/30/2015   CTS (carpal tunnel syndrome) 09/26/2015   Lumbar stenosis 09/26/2015   Weakness 09/05/2015   Spinal stenosis in cervical region 06/12/2015   Leukocytopenia 05/22/2013   History of spinal cord injury 12/12/2011   Paraplegia ([place]) 12/11/2011   Altered  mental status 12/11/2011   DM (diabetes mellitus) ([place]) 12/11/2011   Overactive bladder 12/11/2011   MS (multiple sclerosis) ([place])    Hypertension     Willow Ora, PTA, [facility] [address] [place], [place] [address] [phone] 01/16/21, 10:05 AM   Name: Larry [name] MRN: [id number] Date of Birth: 03-Aug-1950

## 2021-01-17 ENCOUNTER — Ambulatory Visit: Payer: Medicare Other

## 2021-01-20 ENCOUNTER — Other Ambulatory Visit: Payer: Self-pay | Admitting: Nurse Practitioner

## 2021-01-20 DIAGNOSIS — K219 Gastro-esophageal reflux disease without esophagitis: Secondary | ICD-10-CM

## 2021-01-21 ENCOUNTER — Other Ambulatory Visit: Payer: Self-pay | Admitting: Nurse Practitioner

## 2021-01-21 DIAGNOSIS — M4802 Spinal stenosis, cervical region: Secondary | ICD-10-CM | POA: Diagnosis not present

## 2021-01-21 DIAGNOSIS — I152 Hypertension secondary to endocrine disorders: Secondary | ICD-10-CM

## 2021-01-21 DIAGNOSIS — G35 Multiple sclerosis: Secondary | ICD-10-CM | POA: Diagnosis not present

## 2021-01-21 DIAGNOSIS — M4316 Spondylolisthesis, lumbar region: Secondary | ICD-10-CM | POA: Diagnosis not present

## 2021-01-21 DIAGNOSIS — G8220 Paraplegia, unspecified: Secondary | ICD-10-CM | POA: Diagnosis not present

## 2021-01-22 NOTE — Telephone Encounter (Signed)
Per chart was discontinued 08/2020 and no longer needed.

## 2021-01-23 NOTE — Telephone Encounter (Signed)
Chart supports rx refill Last ov: 01/09/2021 Last refill: 11/27/2020

## 2021-01-24 ENCOUNTER — Ambulatory Visit: Payer: Medicare Other

## 2021-01-24 ENCOUNTER — Other Ambulatory Visit: Payer: Self-pay

## 2021-01-24 DIAGNOSIS — R2689 Other abnormalities of gait and mobility: Secondary | ICD-10-CM

## 2021-01-24 DIAGNOSIS — R293 Abnormal posture: Secondary | ICD-10-CM

## 2021-01-24 DIAGNOSIS — M6281 Muscle weakness (generalized): Secondary | ICD-10-CM

## 2021-01-24 NOTE — Patient Instructions (Addendum)
NuStep: Level 4 Completed 8-10 minutes; Goal to be greater than or equal to 60 steps per minute.      Access Code: [email or web address] URL: [email or web address] Date: 01/03/2021 Prepared by: Baldomero Lamy   Exercises Bent Knee Fallouts - 1 x daily - 7 x weekly - 2 sets - 10 reps Supine March - 1 x daily - 7 x weekly - 3 sets - 10 reps Supine Hamstring Stretch with Strap - 1 x daily - 7 x weekly - 1 sets - 3 reps - 30 seconds hold Supine Single Knee to Chest Stretch - 1 x daily - 7 x weekly - 1 sets - 3 reps - 30 seconds hold Supine Lower Trunk Rotation - 1 x daily - 7 x weekly - 1 sets - 5 reps - 15 seconds hold Modified Thomas Stretch - 1 x daily - 7 x weekly - 30 sec hold Seated Long Arc Quad - 1 x daily - 7 x weekly - 2 sets - 10 reps Seated Quad Set - 1 x daily - 7 x weekly - 2 sets - 10 reps - 10 sec hold Seated Hip Adduction Isometrics with Ball - 1 x daily - 7 x weekly - 2 sets - 10 reps

## 2021-01-24 NOTE — Therapy (Signed)
[facility] [address] [place], [place], [address] Phone: [phone]   Fax:  [phone]  Physical Therapy Treatment/Discharge Summary  Patient Details  Name: Larry [name] MRN: [id number] Date of Birth: 08-Nov-1950 Referring Provider (PT): Wilfred Lacy, NP  PHYSICAL THERAPY DISCHARGE SUMMARY  Visits from Start of Care: 13  Current functional level related to goals / functional outcomes: See Clinical Impression Statement   Remaining deficits: Decreased activity tolerance, Abnormal Posture, Muscle Weakness   Education / Equipment: HEP; Participation at [facility]   Patient agrees to discharge. Patient goals were not met. Patient is being discharged due to lack of progress.  Encounter Date: 01/24/2021   PT End of Session - 01/24/21 1533     Visit Number 13    Number of Visits 17    Date for PT Re-Evaluation 01/17/21    Authorization Type UHC Medicare (10th Visit PN)    Progress Note Due on Visit 20    PT Start Time 1531    PT Stop Time 1601    PT Time Calculation (min) 30 min    Equipment Utilized During Treatment Gait belt    Activity Tolerance Patient tolerated treatment well    Behavior During Therapy WFL for tasks assessed/performed             Past Medical History:  Diagnosis Date   Abnormality of gait    Acute delirium 12/11/2011   Acute respiratory failure due to COVID-19 [facility]) 09/23/2019   CTS (carpal tunnel syndrome) 09/26/2015   Diabetes mellitus    Hypercholesterolemia    Hypertension    Kidney stones    Leukocytopenia 05/22/2013   MS (multiple sclerosis) ([place])    sees [facility]   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   Subjective Assessment - 01/24/21 1534      Subjective Denies new changes. No pain. Reports feels like he is standing more frequently at home, legs are still buckling. No falls or incidents of sliding off the bed.    Pertinent History Carpal Tunnel, DM, Multiple Sclerosis (diagnosed in 1996), Hx of T10 SCI (2013), HTN    Limitations Walking;Standing;House hold activities    Currently in Pain? No/denies                [facility] PT Assessment - 01/24/21 0001       Assessment   Medical Diagnosis Multiple Sclerosis    Referring Provider (PT) Wilfred Lacy, NP                 [facility] Adult PT Treatment/Exercise - 01/24/21 0001       Transfers   Transfers Sit to Stand;Stand to Sit    Sit to Stand 4: Min guard    Stand to Sit 4: Min guard    Comments x 2 reps from w/c at countertop.      Ambulation/Gait   Ambulation/Gait No    Gait Comments unable to get compelte gait at this time due to decreased standing tolerance and increased BUE support requiring more stable surface to push from      Self-Care   Self-Care Other Self-Care Comments    Other Self-Care Comments  PT had frank conversation about potential  w/c assesment for power mobility. Patient reports that he does not want anything bulky at this time. PT educating on features of power mobility that may improve paitent's independence.      Therapeutic Activites    Other Therapeutic Activities standing at countertop with BUE support, completed standing tolerance with patient able to stand for 1 min 30 seconds, followed by second trial 1 min 20 seconds. Increased fatigue require seated rest break. PT providing cues for upright posture and faciliation for improved hip extension, intermittent buckling of knees noted but able to maitnain upright due to pressure from cabinet and PT assistance.      Exercises   Exercises Other Exercises    Other Exercises  Reviewed HEP, educating patient on compliance. PT also educating on engaging in silver sneaker program or going to [facility] to  complete NuStep. PT providing handout on NuStep settings.              Access Code: [email or web address] URL: [email or web address] Date: 01/03/2021 Prepared by: Baldomero Lamy   Exercises Bent Knee Fallouts - 1 x daily - 7 x weekly - 2 sets - 10 reps Supine March - 1 x daily - 7 x weekly - 3 sets - 10 reps Supine Hamstring Stretch with Strap - 1 x daily - 7 x weekly - 1 sets - 3 reps - 30 seconds hold Supine Single Knee to Chest Stretch - 1 x daily - 7 x weekly - 1 sets - 3 reps - 30 seconds hold Supine Lower Trunk Rotation - 1 x daily - 7 x weekly - 1 sets - 5 reps - 15 seconds hold Modified Thomas Stretch - 1 x daily - 7 x weekly - 30 sec hold Seated Long Arc Quad - 1 x daily - 7 x weekly - 2 sets - 10 reps Seated Quad Set - 1 x daily - 7 x weekly - 2 sets - 10 reps - 10 sec hold Seated Hip Adduction Isometrics with Ball - 1 x daily - 7 x weekly - 2 sets - 10 reps      PT Education - 01/24/21 1559     Education Details HEP review; NuStep Settings: Participating with NuStep at [employer]) Educated Patient    Methods Explanation;Handout    Comprehension Verbalized understanding              PT Short Term Goals - 12/27/20 1700       PT SHORT TERM GOAL #1   Title Patient will be independent with initial HEP for ROM/Strength (ALL STGs Due: 12/27/20)    Baseline no HEP established    Status Achieved      PT SHORT TERM GOAL #2   Title Patient will be able to stand with BUE support at sink or with RW for >/= 1 minute, with CGA for improved tolerance for functional activities    Baseline 45 seconds    Status Achieved               PT Long Term Goals - 01/24/21 1537       PT LONG TERM GOAL #1   Title Patient will be independent with Final HEP for strengthening/ROM/balance (ALL LTGs:    Baseline no HEP established; reviewed HEP and educating on NuStep at [facility]    Time 8    Period Weeks    Status Achieved      PT LONG TERM GOAL #2   Title Patient  will be able to  stand with BUE support at countertop or RW for >/= 3 minutes for improved standing tolerance with functional tasks    Baseline 45 seconds; 1 min 30 seconds at countertop    Time 8    Period Weeks    Status Not Met      PT LONG TERM GOAL #3   Title Patient will be able to ambulate x 10 ft with RW and Min A from PT for short household distances    Baseline TBA; unable to ambulate at this    Time 8    Period Weeks    Status Deferred      PT LONG TERM GOAL #4   Title Patient will undergo further w/c assesment to determine need for power mobility    Baseline patient does not want to undergo power w/c assesment at this time    Time 8    Period Weeks    Status Deferred                   Plan - 01/24/21 1615     Clinical Impression Statement Assessed progress toward LTG. Due to nature of diagnoses patient has had fluctuations in progress with PT services. patient still demonstating limited standing tolerance, approx 1 min 30 seconds today. As well as inability to ambulate at this time due to significant stable UE support required because of BLE weakness. Reviewed HEP and PT providing extensive education on HEP and completing NuStep at [facility] with silver sneaker program. Patient agreeable to this and d/c today.    Personal Factors and Comorbidities Comorbidity 3+;Time since onset of injury/illness/exacerbation;Age    Comorbidities Carpal Tunnel, DM, Multiple Sclerosis, Hx of T10 SCI (2013), HTN    Examination-Activity Limitations Bed Mobility;Locomotion Level;Reach Overhead;Transfers;Toileting;Stand;Bathing;Sit;Dressing;Hygiene/Grooming    Examination-Participation Restrictions Interpersonal Relationship;Community Activity;Cleaning    Rehab Potential Good    PT Frequency 2x / week    PT Duration 8 weeks    PT Treatment/Interventions ADLs/Self Care Home Management;Moist Heat;Cryotherapy;DME Instruction;Gait training;Stair training;Functional mobility training;Therapeutic  activities;Therapeutic exercise;Balance training;Neuromuscular re-education;Patient/family education;Orthotic Fit/Training;Wheelchair mobility training;Manual techniques;Vestibular;Dry needling;Passive range of motion    PT Home Exercise Plan Access Code: [email or web address]    Consulted and Agree with Plan of Care Patient             Patient will benefit from skilled therapeutic intervention in order to improve the following deficits and impairments:  Impaired tone, Postural dysfunction, Decreased mobility, Decreased activity tolerance, Decreased endurance, Decreased range of motion, Decreased strength, Impaired flexibility, Difficulty walking, Decreased balance, Abnormal gait, Impaired sensation  Visit Diagnosis: Muscle weakness (generalized)  Abnormal posture  Other abnormalities of gait and mobility     Problem List Patient Active Problem List   Diagnosis Date Noted   Bilateral leg edema 02/09/2020   Elevated LDL cholesterol level 11/09/2019   Multiple subsegmental pulmonary emboli without acute cor pulmonale ([place]) 11/01/2019   Pressure injury of skin 09/25/2019   Benign prostatic hyperplasia with lower urinary tract symptoms 09/14/2019   Cervical stenosis of spinal canal 05/09/2019   Thoracic spinal stenosis 05/09/2019   Gait abnormality 08/08/2018   Spinal stenosis 08/08/2018   Iron deficiency anemia 02/09/2018   Bowel incontinence 01/01/2017   Vitamin D deficiency 01/11/2016   GERD (gastroesophageal reflux disease) 01/11/2016   Spinal stenosis, lumbar region, with neurogenic claudication 01/10/2016   Spinal stenosis of lumbar region with radiculopathy 01/10/2016   Elective surgery    Post-operative pain    Tachycardia    Peripheral neuropathy  Spondylolisthesis of lumbar region 12/30/2015   CTS (carpal tunnel syndrome) 09/26/2015   Lumbar stenosis 09/26/2015   Weakness 09/05/2015   Spinal stenosis in cervical region 06/12/2015   Leukocytopenia 05/22/2013   History of  spinal cord injury 12/12/2011   Paraplegia ([place]) 12/11/2011   Altered mental status 12/11/2011   DM (diabetes mellitus) ([place]) 12/11/2011   Overactive bladder 12/11/2011   MS (multiple sclerosis) ([place])    Hypertension     Jones Bales, PT, DPT 01/24/2021, 4:17 PM  [facility] [address] [place], [place], [address] Phone: [phone]   Fax:  [phone]  Name: Geoffery Aultman MRN: [id number] Date of Birth: 1950/08/12

## 2021-01-29 ENCOUNTER — Other Ambulatory Visit: Payer: Self-pay

## 2021-01-29 ENCOUNTER — Other Ambulatory Visit: Payer: Self-pay | Admitting: Nurse Practitioner

## 2021-01-29 DIAGNOSIS — G4700 Insomnia, unspecified: Secondary | ICD-10-CM

## 2021-01-29 DIAGNOSIS — G629 Polyneuropathy, unspecified: Secondary | ICD-10-CM

## 2021-01-29 DIAGNOSIS — E7800 Pure hypercholesterolemia, unspecified: Secondary | ICD-10-CM

## 2021-01-29 MED ORDER — PREGABALIN 100 MG PO CAPS: 100.0000 mg | ORAL_CAPSULE | Freq: Two times a day (BID) | ORAL | 3 refills | Status: DC

## 2021-01-29 NOTE — Telephone Encounter (Signed)
Refill request for: Pregablin 100 mg' LR 07/31/20, #60, 5 rfs LOV  01/09/21 FOV  07/14/20  Please review and advise.   Thanks.  Dm/cma

## 2021-02-06 ENCOUNTER — Other Ambulatory Visit: Payer: Self-pay | Admitting: Nurse Practitioner

## 2021-02-06 DIAGNOSIS — I152 Hypertension secondary to endocrine disorders: Secondary | ICD-10-CM

## 2021-02-06 DIAGNOSIS — G629 Polyneuropathy, unspecified: Secondary | ICD-10-CM

## 2021-02-12 ENCOUNTER — Telehealth: Payer: Self-pay | Admitting: Nurse Practitioner

## 2021-02-12 NOTE — Telephone Encounter (Signed)
Hello Larry [name] [id number] said he asked for a refill from [facility] for meloxicam seems it was prescribed by a different doctor. I told him I would alert you.

## 2021-02-12 NOTE — Telephone Encounter (Signed)
[  11:16 AM] [employer], Lindley Magnus Yer Berendes [id number] said he asked for a refill from [facility] for meloxicam seems it was prescribed by a different doctor. I told him I would alert you.

## 2021-02-17 ENCOUNTER — Telehealth: Payer: Self-pay

## 2021-02-17 ENCOUNTER — Telehealth: Payer: Self-pay | Admitting: Nurse Practitioner

## 2021-02-17 NOTE — Progress Notes (Signed)
Chronic Care Management Pharmacy Assistant   Name: Cailan General  MRN: [id number] DOB: 11-14-1950  Reason for Encounter:Hypertension Disease State Call.   Recent office visits:  No recent Office Visit  Recent consult visits:  01/24/2021 Baldomero Lamy PT (Physical Therapy) No Medication Changes noted 01/15/2021 Willow Ora PTA (Physical Therapy) No Medication Changes noted  Hospital visits:  None in previous 6 months  Medications: Outpatient Encounter Medications as of 02/17/2021  Medication Sig   ACCU-CHEK AVIVA PLUS test strip TEST ONCE DAILY (IN THE   MORNING, FASTING)   Accu-Chek FastClix Lancets MISC    aspirin 81 MG tablet Take 81 mg by mouth daily.   atorvastatin (LIPITOR) 20 MG tablet TAKE 1 TABLET BY MOUTH 3  TIMES WEEKLY   baclofen (LIORESAL) 20 MG tablet TAKE 1 TABLET BY MOUTH 3  TIMES DAILY   Blood Glucose Calibration (ACCU-CHEK AVIVA) SOLN    ferrous sulfate 325 (65 FE) MG tablet Take 1 tablet (325 mg total) by mouth daily with breakfast.   finasteride (PROSCAR) 5 MG tablet TAKE 1 TABLET BY MOUTH  DAILY   Incontinence Supply Disposable (DEPEND ADJUSTABLE UNDERWEAR LG) MISC Use as needed.   Lancets Misc. (ACCU-CHEK FASTCLIX LANCET) KIT 1 Units by Does not apply route daily before breakfast.   meloxicam (MOBIC) 15 MG tablet Take 1 tablet (15 mg total) by mouth daily as needed. for pain. Please make yearly follow up for continued refills or may ask PCP to manage this med.   metFORMIN (GLUCOPHAGE XR) 750 MG 24 hr tablet Take 1 tablet (750 mg total) by mouth daily with breakfast.   mirabegron ER (MYRBETRIQ) 25 MG TB24 tablet Take 1 tablet (25 mg total) by mouth daily.   Multiple Vitamin (MULTIVITAMIN) tablet Take 1 tablet by mouth daily.   Olmesartan-amLODIPine-HCTZ 40-10-12.5 MG TABS TAKE 1 TABLET BY MOUTH  DAILY   pregabalin (LYRICA) 100 MG capsule Take 1 capsule (100 mg total) by mouth 2 (two) times daily.   traZODone (DESYREL) 50 MG tablet TAKE 1 TABLET BY MOUTH   EVERY NIGHT AT BEDTIME   No facility-administered encounter medications on file as of 02/17/2021.    Care Gaps: [id number] Vaccine Ophthalmology Exam Influenza Vaccine Star Rating Drugs: Atorvastatin 20 mg last filled on 09/23/2020 for 90 day supply at [facility]. Metformin 750 mg last filled on 10/17/2020 for 90 day supply at [facility]. Olmesartan-Amlodipine-HCTZ 40-10-12.5 mg last filled on 11/27/2020 for 90 day supply at [facility] Medication Fill Gaps: Baclofen  20 MG last filled on 10/17/2020 for 90 day supply Finasteride 5 MG last filled on 10/17/2020 for 90 day supply Mirabegron ER 25 MG TB24 last filled on 10/17/2020 for 90 day supply  Reviewed chart prior to disease state call. Spoke with patient regarding BP  Recent Office Vitals: BP Readings from Last 3 Encounters:  01/09/21 130/64  07/31/20 120/76  05/02/20 126/72   Pulse Readings from Last 3 Encounters:  01/09/21 78  07/31/20 76  05/02/20 78    Wt Readings from Last 3 Encounters:  01/09/21 169 lb 6.4 oz (76.8 kg)  07/31/20 167 lb (75.8 kg)  05/02/20 155 lb (70.3 kg)     Kidney Function Lab Results  Component Value Date/Time   CREATININE 1.06 01/09/2021 01:20 PM   CREATININE 1.03 05/03/2020 11:11 AM   CREATININE 1.0 01/04/2014 09:43 AM   CREATININE 0.9 05/22/2013 11:15 AM   GFR 70.16 01/09/2021 01:20 PM   GFRNONAA >60 09/30/2019 02:24 AM   GFRNONAA >89 12/25/2011  11:32 AM   GFRAA >60 09/30/2019 02:24 AM   GFRAA >89 12/25/2011 11:32 AM    BMP Latest Ref Rng & Units 01/09/2021 05/03/2020 11/01/2019  Glucose 70 - 99 mg/dL 115(H) 133(H) 117(H)  BUN 6 - 23 mg/dL 21 24(H) 15  Creatinine 0.40 - 1.50 mg/dL 1.06 1.03 0.86  Sodium 135 - 145 mEq/L 140 141 140  Potassium 3.5 - 5.1 mEq/L 3.9 3.9 4.1  Chloride 96 - 112 mEq/L 101 102 103  CO2 19 - 32 mEq/L _0 Calcium 8.4 - 10.5 mg/dL 10.0 9.8 9.6    Current antihypertensive regimen:  Olmesartan-Amlodipine-HCTZ 40-10-12.5 mg daily   Patient  denies headaches,dizziness, lightheadedness or issue/side effects from Olmesartan-Amlodipine-HCTZ. How often are you checking your Blood Pressure? several times per month Current home BP readings:  Patient states he has not check his blood pressure in a few weeks, but will begin to start checking his blood pressure daily.Patient reports his blood pressure usually in the "normal range". What recent interventions/DTPs have been made by any provider to improve Blood Pressure control since last CPP Visit: None ID Any recent hospitalizations or ED visits since last visit with CPP? No What diet changes have been made to improve Blood Pressure Control?  Patient denies any changes in his diet at this time. What exercise is being done to improve your Blood Pressure Control?  Patient attends physical therapy multiple times a week.  Adherence Review: Is the patient currently on ACE/ARB medication? Yes Does the patient have >5 day gap between last estimated fill dates? No   Anderson [place] Clinical Production designer, theatre/television/film [phone]

## 2021-02-21 DIAGNOSIS — G35 Multiple sclerosis: Secondary | ICD-10-CM | POA: Diagnosis not present

## 2021-02-21 DIAGNOSIS — M4802 Spinal stenosis, cervical region: Secondary | ICD-10-CM | POA: Diagnosis not present

## 2021-02-21 DIAGNOSIS — M4316 Spondylolisthesis, lumbar region: Secondary | ICD-10-CM | POA: Diagnosis not present

## 2021-02-21 DIAGNOSIS — G8220 Paraplegia, unspecified: Secondary | ICD-10-CM | POA: Diagnosis not present

## 2021-02-27 ENCOUNTER — Other Ambulatory Visit: Payer: Self-pay

## 2021-02-27 NOTE — Telephone Encounter (Signed)
Received a refill request for:  Meloxicam 15 mg  LR 05/20/20, #90, 0 rfs  ( Dr Campbell Lerner) [place] 01/09/21 FOV 07/14/21  Please review and advise.  Thanks.  Dm/cma

## 2021-02-28 MED ORDER — MELOXICAM 15 MG PO TABS: 15.0000 mg | ORAL_TABLET | Freq: Every day | ORAL | 0 refills | Status: DC | PRN

## 2021-03-03 ENCOUNTER — Other Ambulatory Visit: Payer: Self-pay | Admitting: Nurse Practitioner

## 2021-03-03 DIAGNOSIS — N3281 Overactive bladder: Secondary | ICD-10-CM

## 2021-03-07 ENCOUNTER — Other Ambulatory Visit: Payer: Self-pay | Admitting: Nurse Practitioner

## 2021-03-07 DIAGNOSIS — N401 Enlarged prostate with lower urinary tract symptoms: Secondary | ICD-10-CM

## 2021-03-07 DIAGNOSIS — E1151 Type 2 diabetes mellitus with diabetic peripheral angiopathy without gangrene: Secondary | ICD-10-CM

## 2021-03-12 ENCOUNTER — Telehealth: Payer: Self-pay

## 2021-03-12 NOTE — Progress Notes (Signed)
Chronic Care Management Pharmacy Assistant   Name: Larry [name]  MRN: [id number] DOB: 1950/12/03  Reason for Encounter:Diabetes Disease State Call.   Recent office visits:  No recent Office Visit  Recent consult visits:  No recent [facility] visits:  None in previous 6 months  Medications: Outpatient Encounter Medications as of 03/12/2021  Medication Sig   ACCU-CHEK AVIVA PLUS test strip TEST ONCE DAILY (IN THE   MORNING, FASTING)   Accu-Chek FastClix Lancets MISC    aspirin 81 MG tablet Take 81 mg by mouth daily.   atorvastatin (LIPITOR) 20 MG tablet TAKE 1 TABLET BY MOUTH 3  TIMES WEEKLY   baclofen (LIORESAL) 20 MG tablet TAKE 1 TABLET BY MOUTH 3  TIMES DAILY   Blood Glucose Calibration (ACCU-CHEK AVIVA) SOLN    ferrous sulfate 325 (65 FE) MG tablet Take 1 tablet (325 mg total) by mouth daily with breakfast.   finasteride (PROSCAR) 5 MG tablet TAKE 1 TABLET BY MOUTH  DAILY   Incontinence Supply Disposable (DEPEND ADJUSTABLE UNDERWEAR LG) MISC Use as needed.   Lancets Misc. (ACCU-CHEK FASTCLIX LANCET) KIT 1 Units by Does not apply route daily before breakfast.   meloxicam (MOBIC) 15 MG tablet Take 1 tablet (15 mg total) by mouth daily as needed. for pain. Please make yearly follow up for continued refills or may ask PCP to manage this med.   metFORMIN (GLUCOPHAGE XR) 750 MG 24 hr tablet Take 1 tablet (750 mg total) by mouth daily with breakfast.   mirabegron ER (MYRBETRIQ) 25 MG TB24 tablet Take 1 tablet (25 mg total) by mouth daily.   Multiple Vitamin (MULTIVITAMIN) tablet Take 1 tablet by mouth daily.   Olmesartan-amLODIPine-HCTZ 40-10-12.5 MG TABS TAKE 1 TABLET BY MOUTH  DAILY   pregabalin (LYRICA) 100 MG capsule Take 1 capsule (100 mg total) by mouth 2 (two) times daily.   traZODone (DESYREL) 50 MG tablet TAKE 1 TABLET BY MOUTH  EVERY NIGHT AT BEDTIME   No facility-administered encounter medications on file as of 03/12/2021.    Care Gaps: COVID-19  Vaccine (Dose 3- Booster for [employer] series)  Ophthalmology Exam (Last Completed 12/10/2020) Influenza Vaccine Star Rating Drugs: Atorvastatin 20 mg last filled on 12/05/2020 for 90 day supply at [facility]. Metformin 750 mg last filled on 10/17/2020 for 90 day supply at [facility]. Olmesartan-Amlodipine-HCTZ 40-10-12.5 mg last filled on 11/27/2020 for 90 day supply at [facility] Medication Fill Gaps: Baclofen  20 MG last filled on 10/17/2020 for 90 day supply Finasteride 5 MG last filled on 10/17/2020 for 90 day supply Mirabegron ER 25 MG TB24 last filled on 10/17/2020 for 90 day supply  Recent Relevant Labs: Lab Results  Component Value Date/Time   HGBA1C 6.7 (H) 01/09/2021 01:20 PM   HGBA1C 6.0 (A) 05/02/2020 03:09 PM   HGBA1C 6.1 11/01/2019 11:43 AM   MICROALBUR 4.7 (H) 07/27/2016 12:57 PM    Kidney Function Lab Results  Component Value Date/Time   CREATININE 1.06 01/09/2021 01:20 PM   CREATININE 1.03 05/03/2020 11:11 AM   CREATININE 1.0 01/04/2014 09:43 AM   CREATININE 0.9 05/22/2013 11:15 AM   GFR 70.16 01/09/2021 01:20 PM   GFRNONAA >60 09/30/2019 02:24 AM   GFRNONAA >89 12/25/2011 11:32 AM   GFRAA >60 09/30/2019 02:24 AM   GFRAA >89 12/25/2011 11:32 AM    Current antihyperglycemic regimen:  Metformin XR 750 mg daily  What recent interventions/DTPs have been made to improve glycemic control:  None ID Have there  been any recent hospitalizations or ED visits since last visit with CPP? No  I have attempted without success to contact this patient by phone three times to do his Diabetes Disease State call. I left a Voice message for patient to return my call.LVM 10/12,10/13,10/17  Adherence Review: Is the patient currently on a STATIN medication? Yes Is the patient currently on ACE/ARB medication? Yes Does the patient have >5 day gap between last estimated fill dates? Yes   Telephone follow up appointment with care management team member scheduled for:   07/03/2021 at 10:00 AM  [place] Pharmacist Assistant [phone]

## 2021-03-23 DIAGNOSIS — M4316 Spondylolisthesis, lumbar region: Secondary | ICD-10-CM | POA: Diagnosis not present

## 2021-03-23 DIAGNOSIS — G8220 Paraplegia, unspecified: Secondary | ICD-10-CM | POA: Diagnosis not present

## 2021-03-23 DIAGNOSIS — G35 Multiple sclerosis: Secondary | ICD-10-CM | POA: Diagnosis not present

## 2021-03-23 DIAGNOSIS — M4802 Spinal stenosis, cervical region: Secondary | ICD-10-CM | POA: Diagnosis not present

## 2021-03-27 ENCOUNTER — Other Ambulatory Visit: Payer: Self-pay | Admitting: Nurse Practitioner

## 2021-03-27 DIAGNOSIS — K219 Gastro-esophageal reflux disease without esophagitis: Secondary | ICD-10-CM

## 2021-04-10 ENCOUNTER — Telehealth: Payer: Self-pay

## 2021-04-10 NOTE — Progress Notes (Signed)
Chronic Care Management Pharmacy Assistant   Name: Larry [name]  MRN: [id number] DOB: 10/16/1950  Reason for Encounter:Hypertension Disease State Call.   Recent office visits:  No recent office visit  Recent consult visits:  No recent consult Visit  Hospital visits:  None in previous 6 months  Medications: Outpatient Encounter Medications as of 04/10/2021  Medication Sig   ACCU-CHEK AVIVA PLUS test strip TEST ONCE DAILY (IN THE   MORNING, FASTING)   Accu-Chek FastClix Lancets MISC    aspirin 81 MG tablet Take 81 mg by mouth daily.   atorvastatin (LIPITOR) 20 MG tablet TAKE 1 TABLET BY MOUTH 3  TIMES WEEKLY   baclofen (LIORESAL) 20 MG tablet TAKE 1 TABLET BY MOUTH 3  TIMES DAILY   Blood Glucose Calibration (ACCU-CHEK AVIVA) SOLN    ferrous sulfate 325 (65 FE) MG tablet Take 1 tablet (325 mg total) by mouth daily with breakfast.   finasteride (PROSCAR) 5 MG tablet TAKE 1 TABLET BY MOUTH  DAILY   Incontinence Supply Disposable (DEPEND ADJUSTABLE UNDERWEAR LG) MISC Use as needed.   Lancets Misc. (ACCU-CHEK FASTCLIX LANCET) KIT 1 Units by Does not apply route daily before breakfast.   meloxicam (MOBIC) 15 MG tablet Take 1 tablet (15 mg total) by mouth daily as needed. for pain. Please make yearly follow up for continued refills or may ask PCP to manage this med.   metFORMIN (GLUCOPHAGE XR) 750 MG 24 hr tablet Take 1 tablet (750 mg total) by mouth daily with breakfast.   mirabegron ER (MYRBETRIQ) 25 MG TB24 tablet Take 1 tablet (25 mg total) by mouth daily.   Multiple Vitamin (MULTIVITAMIN) tablet Take 1 tablet by mouth daily.   Olmesartan-amLODIPine-HCTZ 40-10-12.5 MG TABS TAKE 1 TABLET BY MOUTH  DAILY   pregabalin (LYRICA) 100 MG capsule Take 1 capsule (100 mg total) by mouth 2 (two) times daily.   traZODone (DESYREL) 50 MG tablet TAKE 1 TABLET BY MOUTH  EVERY NIGHT AT BEDTIME   No facility-administered encounter medications on file as of 04/10/2021.   Care Gaps: COVID-19  Vaccine (Dose 3- Booster for [employer] series)  Ophthalmology Exam (Last Completed 12/10/2020) Influenza Vaccine Fecal DNA (Last Completed 03/23/2018) Star Rating Drugs: Atorvastatin 20 mg last filled on 12/05/2020 for 90 day supply at [facility]. Metformin 750 mg last filled on 01/10/2021 for 90 day supply no pharmacy noted. Olmesartan-Amlodipine-HCTZ 40-10-12.5 mg last filled on 11/27/2020 for 90 day supply at [facility] Medication Fill Gaps: Baclofen  20 MG last filled on 10/17/2020 for 90 day supply Mirabegron ER 25 MG TB24 last filled on 10/17/2020 for 90 day supply  Reviewed chart prior to disease state call. Spoke with patient regarding BP  Recent Office Vitals: BP Readings from Last 3 Encounters:  01/09/21 130/64  07/31/20 120/76  05/02/20 126/72   Pulse Readings from Last 3 Encounters:  01/09/21 78  07/31/20 76  05/02/20 78    Wt Readings from Last 3 Encounters:  01/09/21 169 lb 6.4 oz (76.8 kg)  07/31/20 167 lb (75.8 kg)  05/02/20 155 lb (70.3 kg)     Kidney Function Lab Results  Component Value Date/Time   CREATININE 1.06 01/09/2021 01:20 PM   CREATININE 1.03 05/03/2020 11:11 AM   CREATININE 1.0 01/04/2014 09:43 AM   CREATININE 0.9 05/22/2013 11:15 AM   GFR 70.16 01/09/2021 01:20 PM   GFRNONAA >60 09/30/2019 02:24 AM   GFRNONAA >89 12/25/2011 11:32 AM   GFRAA >60 09/30/2019 02:24 AM   GFRAA >89  12/25/2011 11:32 AM    BMP Latest Ref Rng & Units 01/09/2021 05/03/2020 11/01/2019  Glucose 70 - 99 mg/dL 115(H) 133(H) 117(H)  BUN 6 - 23 mg/dL 21 24(H) 15  Creatinine 0.40 - 1.50 mg/dL 1.06 1.03 0.86  Sodium 135 - 145 mEq/L 140 141 140  Potassium 3.5 - 5.1 mEq/L 3.9 3.9 4.1  Chloride 96 - 112 mEq/L 101 102 103  CO2 19 - 32 mEq/L '30 31 29  ' Calcium 8.4 - 10.5 mg/dL 10.0 9.8 9.6    Current antihypertensive regimen:  Olmesartan-Amlodipine-HCTZ 40-10-12.5 mg daily   What recent interventions/DTPs have been made by any provider to improve Blood Pressure  control since last CPP Visit: None ID Any recent hospitalizations or ED visits since last visit with CPP? No  I have attempted without success to contact this patient by phone three times to do his Hypertension Disease State call. I left a Voice message for patient to return my call.LVM 11/10,11/14,11/17  Adherence Review: Is the patient currently on ACE/ARB medication? Yes Does the patient have >5 day gap between last estimated fill dates? Yes   Telephone follow up appointment with Care management team member scheduled for : 07/03/2021 at 10:00 am.  [place] Pharmacist Assistant [phone]

## 2021-04-15 ENCOUNTER — Ambulatory Visit: Payer: Medicare Other

## 2021-04-18 ENCOUNTER — Telehealth: Payer: Self-pay

## 2021-04-18 NOTE — Telephone Encounter (Signed)
Called patient to inform him we received a fax from [place] he did not complete the kit sent out to him. Pt states he still has kit and will complete it within the next few days. Provider informed.

## 2021-04-23 DIAGNOSIS — M4802 Spinal stenosis, cervical region: Secondary | ICD-10-CM | POA: Diagnosis not present

## 2021-04-23 DIAGNOSIS — G8220 Paraplegia, unspecified: Secondary | ICD-10-CM | POA: Diagnosis not present

## 2021-04-23 DIAGNOSIS — G35 Multiple sclerosis: Secondary | ICD-10-CM | POA: Diagnosis not present

## 2021-04-23 DIAGNOSIS — M4316 Spondylolisthesis, lumbar region: Secondary | ICD-10-CM | POA: Diagnosis not present

## 2021-04-24 ENCOUNTER — Other Ambulatory Visit: Payer: Self-pay | Admitting: Nurse Practitioner

## 2021-04-24 DIAGNOSIS — G35 Multiple sclerosis: Secondary | ICD-10-CM

## 2021-04-29 NOTE — Telephone Encounter (Signed)
Chart supports Rx Last seen 01/09/21 Next OV 07/14/21

## 2021-04-30 ENCOUNTER — Ambulatory Visit (INDEPENDENT_AMBULATORY_CARE_PROVIDER_SITE_OTHER): Payer: Medicare Other

## 2021-04-30 DIAGNOSIS — Z0000 Encounter for general adult medical examination without abnormal findings: Secondary | ICD-10-CM | POA: Diagnosis not present

## 2021-04-30 NOTE — Progress Notes (Signed)
 Subjective:   Larry [name] is a 70 y.o. male who presents for an Initial Medicare Annual Wellness Visit.   I connected with Larry [name] today by telephone and verified that I am speaking with the correct person using two identifiers. Location patient: home Location provider: work Persons participating in the virtual visit: patient, provider.   I discussed the limitations, risks, security and privacy concerns of performing an evaluation and management service by telephone and the availability of in person appointments. I also discussed with the patient that there may be a patient responsible charge related to this service. The patient expressed understanding and verbally consented to this telephonic visit.    Interactive audio and video telecommunications were attempted between this provider and patient, however failed, due to patient having technical difficulties OR patient did not have access to video capability.  We continued and completed visit with audio only.    Review of Systems     Cardiac Risk Factors include: advanced age (>55men, >65 women);diabetes mellitus;male gender;dyslipidemia;hypertension     Objective:    Today's Vitals   There is no height or weight on file to calculate BMI.  Advanced Directives 04/30/2021 11/27/2020 04/09/2020 09/24/2019 01/06/2016 12/16/2015 01/04/2014  Does Patient Have a Medical Advance Directive? No No No No No No Patient does not have advance directive;Patient would like information  Does patient want to make changes to medical advance directive? - - Yes (MAU/Ambulatory/Procedural Areas - Information given) - - - -  Would patient like information on creating a medical advance directive? No - Patient declined No - Patient declined - No - Patient declined - No - patient declined information Advance directive brochure given (Outpatient ONLY)  Pre-existing out of facility DNR order (yellow form or pink MOST form) - - - - - - -    Current  Medications (verified) Outpatient Encounter Medications as of 04/30/2021  Medication Sig   ACCU-CHEK AVIVA PLUS test strip TEST ONCE DAILY (IN THE   MORNING, FASTING)   Accu-Chek FastClix Lancets MISC    aspirin 81 MG tablet Take 81 mg by mouth daily.   atorvastatin (LIPITOR) 20 MG tablet TAKE 1 TABLET BY MOUTH 3  TIMES WEEKLY   baclofen (LIORESAL) 20 MG tablet TAKE 1 TABLET BY MOUTH 3  TIMES DAILY   Blood Glucose Calibration (ACCU-CHEK AVIVA) SOLN    ferrous sulfate 325 (65 FE) MG tablet Take 1 tablet (325 mg total) by mouth daily with breakfast.   finasteride (PROSCAR) 5 MG tablet TAKE 1 TABLET BY MOUTH  DAILY   Incontinence Supply Disposable (DEPEND ADJUSTABLE UNDERWEAR LG) MISC Use as needed.   Lancets Misc. (ACCU-CHEK FASTCLIX LANCET) KIT 1 Units by Does not apply route daily before breakfast.   meloxicam (MOBIC) 15 MG tablet TAKE 1 TABLET BY MOUTH  DAILY AS NEEDED FOR PAIN   metFORMIN (GLUCOPHAGE XR) 750 MG 24 hr tablet Take 1 tablet (750 mg total) by mouth daily with breakfast.   mirabegron ER (MYRBETRIQ) 25 MG TB24 tablet Take 1 tablet (25 mg total) by mouth daily.   Multiple Vitamin (MULTIVITAMIN) tablet Take 1 tablet by mouth daily.   Olmesartan-amLODIPine-HCTZ 40-10-12.5 MG TABS TAKE 1 TABLET BY MOUTH  DAILY   pregabalin (LYRICA) 100 MG capsule Take 1 capsule (100 mg total) by mouth 2 (two) times daily.   traZODone (DESYREL) 50 MG tablet TAKE 1 TABLET BY MOUTH  EVERY NIGHT AT BEDTIME   No facility-administered encounter medications on file as of 04/30/2021.    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   hx surgery 1996 with bilat gait abnormality since   Past Surgical History:  Procedure Laterality Date   BACK SURGERY     COLONOSCOPY     LUMBAR LAMINECTOMY     Family History  Problem Relation Age of Onset   Hypertension Mother    Liver cancer Father    Hypertension Sister    Social History   Socioeconomic History   Marital status: Divorced    Spouse name: Not on file   Number of children: 1   Years of education: 13   Highest education level: Not on file  Occupational History   Occupation: Disability  Tobacco Use   Smoking status: Never   Smokeless tobacco: Never  Vaping Use   Vaping Use: Never used  Substance and Sexual Activity   Alcohol use: No   Drug use: No   Sexual activity: Not Currently  Other Topics Concern   Not on file  Social History Narrative   Work or School:none      Home Situation:lives alone, feels safe      Spiritual Beliefs: Christian      Lifestyle: [facility] [employer]; diet is poor - likes to cook but like unhealthy options      Right-handed.    2 cups caffeine per day.   Social Determinants of Health   Financial Resource Strain: Low Risk    Difficulty of Paying Living Expenses: Not hard at all  Food Insecurity: No Food Insecurity   Worried About Running Out of Food in the Last Year: Never true   Ran Out of Food in the Last Year: Never true  Transportation Needs: No Transportation Needs   Lack of Transportation (Medical): No   Lack of Transportation (Non-Medical): No  Physical Activity: Insufficiently Active   Days of Exercise per Week: 2 days   Minutes of Exercise per Session: 20 min  Stress: No Stress Concern Present   Feeling of Stress : Not at all  Social Connections: Moderately Integrated   Frequency of Communication with Friends and Family: Twice a week   Frequency of Social Gatherings with Friends and Family: Twice a week   Attends Religious  Services: More than 4 times per year   Active Member of Clubs or Organizations: Yes   Attends Club or Organization Meetings: More than 4 times per year   Marital Status: Divorced    Tobacco Counseling Counseling given: Not Answered   Clinical Intake:  Pre-visit preparation completed: Yes  Pain : No/denies pain     Nutritional Risks: None Diabetes: Yes CBG done?: No Did pt. bring in CBG monitor from home?: No  How often do you need to have someone help you when you read instructions, pamphlets, or other written materials from your doctor or pharmacy?: 1 - Never What is the last grade level you completed in school?: college  Diabetic?yes  Nutrition Risk Assessment:  Has the patient had any N/V/D within the last   2 months?  No  Does the patient have any non-healing wounds?  No  Has the patient had any unintentional weight loss or weight gain?  No   Diabetes:  Is the patient diabetic?  Yes  If diabetic, was a CBG obtained today?  No  Did the patient bring in their glucometer from home?  No  How often do you monitor your CBG's? Every 2 weeks .   Financial Strains and Diabetes Management:  Are you having any financial strains with the device, your supplies or your medication? No .  Does the patient want to be seen by Chronic Care Management for management of their diabetes?  No  Would the patient like to be referred to a Nutritionist or for Diabetic Management?  No   Diabetic Exams:  Diabetic Eye Exam: Completed 10/2020 Diabetic Foot Exam: Overdue, Pt has been advised about the importance in completing this exam. Pt is scheduled for diabetic foot exam on next office visit .   Interpreter Needed?: No  Information entered by :: L.,LPn   Activities of Daily Living In your present state of health, do you have any difficulty performing the following activities: 04/30/2021  Hearing? N  Vision? N  Difficulty concentrating or making decisions? N  Walking or  climbing stairs? N  Dressing or bathing? N  Doing errands, shopping? N  Preparing Food and eating ? N  Using the Toilet? N  In the past six months, have you accidently leaked urine? N  Do you have problems with loss of bowel control? N  Managing your Medications? N  Managing your Finances? N  Housekeeping or managing your Housekeeping? N  Some recent data might be hidden    Patient Care Team: Nche, Charlotte Lum, NP as PCP - General (Internal Medicine) Elsner, Henry, MD as Consulting Physician (Neurosurgery) Elsner, Henry, MD as Consulting Physician (Neurosurgery) Fleury, Alexandre A, RPH as Pharmacist (Pharmacist)  Indicate any recent Medical Services you may have received from other than [facility] providers in the past year (date may be approximate).     Assessment:   This is a routine wellness examination for Larry [name].  Hearing/Vision screen Vision Screening - Comments:: Annual eye exams wears glasses   Dietary issues and exercise activities discussed: Current Exercise Habits: Home exercise routine, Type of exercise: stretching, Time (Minutes): 20, Frequency (Times/Week): 3, Weekly Exercise (Minutes/Week): 60, Intensity: Mild, Exercise limited by: orthopedic condition(s);neurologic condition(s)   Goals Addressed   None    Depression Screen PHQ 2/9 Scores 04/30/2021 04/30/2021 11/19/2020 07/17/2020 04/09/2020 01/18/2020 12/28/2019  PHQ - 2 Score 0 0 0 0 0 0 0    Fall Risk Fall Risk  04/30/2021 04/09/2020 01/09/2020 09/14/2019 03/24/2019  Falls in the past year? 0 0 1 0 0  Number falls in past yr: 0 0 0 0 -  Injury with Fall? 0 0 0 0 -  Risk for fall due to : - Impaired mobility History of fall(s);Impaired mobility;Impaired balance/gait - -  Risk for fall due to: Comment - - - - -  Follow up Falls evaluation completed Falls prevention discussed Falls evaluation completed - -  Comment wheelchair - - - -    FALL RISK PREVENTION PERTAINING TO THE HOME:  Any stairs in or around the  home? No  If so, are there any without handrails? No  Home free of loose throw rugs in walkways, pet beds, electrical cords, etc? Yes  Adequate lighting in your home to reduce risk of falls? Yes     ASSISTIVE DEVICES UTILIZED TO PREVENT FALLS:  Life alert? Yes  Use of a cane, walker or w/c? Yes  Grab bars in the bathroom? Yes  Shower chair or bench in shower? Yes  Elevated toilet seat or a handicapped toilet? Yes    Cognitive Function:    Normal cognitive status assessed by direct observation by this Nurse Health Advisor. No abnormalities found.      Immunizations Immunization History  Administered Date(s) Administered   Fluad Quad(high Dose 65+) 03/24/2019, 05/02/2020   Influenza Split 02/05/2012   Influenza, High Dose Seasonal PF 03/05/2014, 05/17/2015, 02/15/2017, 06/10/2018   Influenza,inj,Quad PF,6+ Mos 05/09/2013   PFIZER(Purple Top)SARS-COV-2 Vaccination 01/14/2020, 02/04/2020   PPD Test 01/05/2016   Pneumococcal Conjugate-13 03/05/2014   Pneumococcal Polysaccharide-23 05/17/2015    TDAP status: Due, Education has been provided regarding the importance of this vaccine. Advised may receive this vaccine at local pharmacy or Health Dept. Aware to provide a copy of the vaccination record if obtained from local pharmacy or Health Dept. Verbalized acceptance and understanding.  Flu Vaccine status: Up to date  Pneumococcal vaccine status: Up to date  Covid-19 vaccine status: Completed vaccines  Qualifies for Shingles Vaccine? Yes   Zostavax completed No   Shingrix Completed?: No.    Education has been provided regarding the importance of this vaccine. Patient has been advised to call insurance company to determine out of pocket expense if they have not yet received this vaccine. Advised may also receive vaccine at local pharmacy or Health Dept. Verbalized acceptance and understanding.  Screening Tests Health Maintenance  Topic Date Due   COVID-19 Vaccine (3 - Booster for  Pfizer series) 03/31/2020   OPHTHALMOLOGY EXAM  12/10/2020   INFLUENZA VACCINE  12/30/2020   Fecal DNA (Cologuard)  03/23/2021   Zoster Vaccines- Shingrix (1 of 2) 07/16/2021 (Originally 08/08/1998)   TETANUS/TDAP  01/09/2022 (Originally [birth year]1967)   FOOT EXAM  05/02/2021   HEMOGLOBIN A1C  07/12/2021   Pneumonia Vaccine 8+ Years old  Completed   Hepatitis C Screening  Completed   HPV VACCINES  Aged Out   COLONOSCOPY (Pts 45-37yr Insurance coverage will need to be confirmed)  Discontinued    Health Maintenance  Health Maintenance Due  Topic Date Due   COVID-19 Vaccine (3 - Booster for PRocky Ridgeseries) 03/31/2020   OPHTHALMOLOGY EXAM  12/10/2020   INFLUENZA VACCINE  12/30/2020   Fecal DNA (Cologuard)  03/23/2021    Colorectal cancer screening: Referral to GI placed has cologuard kit will complete . Pt aware the office will call re: appt.  Lung Cancer Screening: (Low Dose CT Chest recommended if Age 72-80years, 30 pack-year currently smoking OR have quit w/in 15years.) does not qualify.   Lung Cancer Screening Referral: n/a  Additional Screening:  Hepatitis C Screening: does not qualify; Completed 05/22/2013  Vision Screening: Recommended annual ophthalmology exams for early detection of glaucoma and other disorders of the eye. Is the patient up to date with their annual eye exam?  Yes  Who is the provider or what is the name of the office in which the patient attends annual eye exams? [facility]  If pt is not established with a provider, would they like to be referred to a provider to establish care? No .   Dental Screening: Recommended annual dental exams for proper oral hygiene  Community Resource Referral / Chronic Care Management: CRR required this visit?  No   CCM required this visit?  No      Plan:  I have personally reviewed and noted the following in the patient's chart:   Medical and social history Use of alcohol, tobacco or illicit drugs  Current  medications and supplements including opioid prescriptions. Patient is not currently taking opioid prescriptions. Functional ability and status Nutritional status Physical activity Advanced directives List of other physicians Hospitalizations, surgeries, and ER visits in previous 12 months Vitals Screenings to include cognitive, depression, and falls Referrals and appointments  In addition, I have reviewed and discussed with patient certain preventive protocols, quality metrics, and best practice recommendations. A written personalized care plan for preventive services as well as general preventive health recommendations were provided to patient.     Randel Pigg, LPN   [id number]   Nurse Notes: none

## 2021-04-30 NOTE — Patient Instructions (Signed)
[name] , Thank you for taking time to come for your Medicare Wellness Visit. I appreciate your ongoing commitment to your health goals. Please review the following plan we discussed and let me know if I can assist you in the future.   Screening recommendations/referrals: Colonoscopy: Pt has Cologuard kit will complete  Recommended yearly ophthalmology/optometry visit for glaucoma screening and checkup Recommended yearly dental visit for hygiene and checkup  Vaccinations: Influenza vaccine: completed  Pneumococcal vaccine: completed  Tdap vaccine: due  Shingles vaccine: will consider     Advanced directives: none   Conditions/risks identified: none   Next appointment: none   Preventive Care 72 Years and Older, Male Preventive care refers to lifestyle choices and visits with your health care provider that can promote health and wellness. What does preventive care include? A yearly physical exam. This is also called an annual well check. Dental exams once or twice a year. Routine eye exams. Ask your health care provider how often you should have your eyes checked. Personal lifestyle choices, including: Daily care of your teeth and gums. Regular physical activity. Eating a healthy diet. Avoiding tobacco and drug use. Limiting alcohol use. Practicing safe sex. Taking low doses of aspirin every day. Taking vitamin and mineral supplements as recommended by your health care provider. What happens during an annual well check? The services and screenings done by your health care provider during your annual well check will depend on your age, overall health, lifestyle risk factors, and family history of disease. Counseling  Your health care provider may ask you questions about your: Alcohol use. Tobacco use. Drug use. Emotional well-being. Home and relationship well-being. Sexual activity. Eating habits. History of falls. Memory and ability to understand (cognition). Work  and work Statistician. Screening  You may have the following tests or measurements: Height, weight, and BMI. Blood pressure. Lipid and cholesterol levels. These may be checked every 5 years, or more frequently if you are over 40 years old. Skin check. Lung cancer screening. You may have this screening every year starting at age 59 if you have a 30-pack-year history of smoking and currently smoke or have quit within the past 15 years. Fecal occult blood test (FOBT) of the stool. You may have this test every year starting at age 67. Flexible sigmoidoscopy or colonoscopy. You may have a sigmoidoscopy every 5 years or a colonoscopy every 10 years starting at age 7. Prostate cancer screening. Recommendations will vary depending on your family history and other risks. Hepatitis C blood test. Hepatitis B blood test. Sexually transmitted disease (STD) testing. Diabetes screening. This is done by checking your blood sugar (glucose) after you have not eaten for a while (fasting). You may have this done every 1-3 years. Abdominal aortic aneurysm (AAA) screening. You may need this if you are a current or former smoker. Osteoporosis. You may be screened starting at age 86 if you are at high risk. Talk with your health care provider about your test results, treatment options, and if necessary, the need for more tests. Vaccines  Your health care provider may recommend certain vaccines, such as: Influenza vaccine. This is recommended every year. Tetanus, diphtheria, and acellular pertussis (Tdap, Td) vaccine. You may need a Td booster every 10 years. Zoster vaccine. You may need this after age 69. Pneumococcal 13-valent conjugate (PCV13) vaccine. One dose is recommended after age 69. Pneumococcal polysaccharide (PPSV23) vaccine. One dose is recommended after age 48. Talk to your health care provider about which screenings and  vaccines you need and how often you need them. This information is not intended  to replace advice given to you by your health care provider. Make sure you discuss any questions you have with your health care provider. Document Released: 06/14/2015 Document Revised: 02/05/2016 Document Reviewed: 03/19/2015 Elsevier Interactive Patient Education  2017 [place] Prevention in the Home Falls can cause injuries. They can happen to people of all ages. There are many things you can do to make your home safe and to help prevent falls. What can I do on the outside of my home? Regularly fix the edges of walkways and driveways and fix any cracks. Remove anything that might make you trip as you walk through a door, such as a raised step or threshold. Trim any bushes or trees on the path to your home. Use bright outdoor lighting. Clear any walking paths of anything that might make someone trip, such as rocks or tools. Regularly check to see if handrails are loose or broken. Make sure that both sides of any steps have handrails. Any raised decks and porches should have guardrails on the edges. Have any leaves, snow, or ice cleared regularly. Use sand or salt on walking paths during winter. Clean up any spills in your garage right away. This includes oil or grease spills. What can I do in the bathroom? Use night lights. Install grab bars by the toilet and in the tub and shower. Do not use towel bars as grab bars. Use non-skid mats or decals in the tub or shower. If you need to sit down in the shower, use a plastic, non-slip stool. Keep the floor dry. Clean up any water that spills on the floor as soon as it happens. Remove soap buildup in the tub or shower regularly. Attach bath mats securely with double-sided non-slip rug tape. Do not have throw rugs and other things on the floor that can make you trip. What can I do in the bedroom? Use night lights. Make sure that you have a light by your bed that is easy to reach. Do not use any sheets or blankets that are too big  for your bed. They should not hang down onto the floor. Have a firm chair that has side arms. You can use this for support while you get dressed. Do not have throw rugs and other things on the floor that can make you trip. What can I do in the kitchen? Clean up any spills right away. Avoid walking on wet floors. Keep items that you use a lot in easy-to-reach places. If you need to reach something above you, use a strong step stool that has a grab bar. Keep electrical cords out of the way. Do not use floor polish or wax that makes floors slippery. If you must use wax, use non-skid floor wax. Do not have throw rugs and other things on the floor that can make you trip. What can I do with my stairs? Do not leave any items on the stairs. Make sure that there are handrails on both sides of the stairs and use them. Fix handrails that are broken or loose. Make sure that handrails are as long as the stairways. Check any carpeting to make sure that it is firmly attached to the stairs. Fix any carpet that is loose or worn. Avoid having throw rugs at the top or bottom of the stairs. If you do have throw rugs, attach them to the floor with carpet tape. Make  sure that you have a light switch at the top of the stairs and the bottom of the stairs. If you do not have them, ask someone to add them for you. What else can I do to help prevent falls? Wear shoes that: Do not have high heels. Have rubber bottoms. Are comfortable and fit you well. Are closed at the toe. Do not wear sandals. If you use a stepladder: Make sure that it is fully opened. Do not climb a closed stepladder. Make sure that both sides of the stepladder are locked into place. Ask someone to hold it for you, if possible. Clearly mark and make sure that you can see: Any grab bars or handrails. First and last steps. Where the edge of each step is. Use tools that help you move around (mobility aids) if they are needed. These  include: Canes. Walkers. Scooters. Crutches. Turn on the lights when you go into a dark area. Replace any light bulbs as soon as they burn out. Set up your furniture so you have a clear path. Avoid moving your furniture around. If any of your floors are uneven, fix them. If there are any pets around you, be aware of where they are. Review your medicines with your doctor. Some medicines can make you feel dizzy. This can increase your chance of falling. Ask your doctor what other things that you can do to help prevent falls. This information is not intended to replace advice given to you by your health care provider. Make sure you discuss any questions you have with your health care provider. Document Released: 03/14/2009 Document Revised: 10/24/2015 Document Reviewed: 06/22/2014 Elsevier Interactive Patient Education  2017 [employer].

## 2021-05-01 NOTE — Telephone Encounter (Signed)
Chart supports rx refill Last ov: 01/09/2021

## 2021-05-12 ENCOUNTER — Telehealth: Payer: Self-pay

## 2021-05-12 NOTE — Progress Notes (Signed)
Chronic Care Management Pharmacy Assistant   Name: Larry [name]  MRN: [id number] DOB: 01/26/1951  Reason for Encounter:Diabetes Disease State Call.   Recent office visits:  04/30/2021 Randel Pigg LPN (PCP Office) Medicare Wellness completed  Recent consult visits:  No recent consult visits  Hospital visits:  None in previous 6 months  Medications: Outpatient Encounter Medications as of 05/12/2021  Medication Sig   ACCU-CHEK AVIVA PLUS test strip TEST ONCE DAILY (IN THE   MORNING, FASTING)   Accu-Chek FastClix Lancets MISC    aspirin 81 MG tablet Take 81 mg by mouth daily.   atorvastatin (LIPITOR) 20 MG tablet TAKE 1 TABLET BY MOUTH 3  TIMES WEEKLY   baclofen (LIORESAL) 20 MG tablet TAKE 1 TABLET BY MOUTH 3  TIMES DAILY   Blood Glucose Calibration (ACCU-CHEK AVIVA) SOLN    ferrous sulfate 325 (65 FE) MG tablet Take 1 tablet (325 mg total) by mouth daily with breakfast.   finasteride (PROSCAR) 5 MG tablet TAKE 1 TABLET BY MOUTH  DAILY   Incontinence Supply Disposable (DEPEND ADJUSTABLE UNDERWEAR LG) MISC Use as needed.   Lancets Misc. (ACCU-CHEK FASTCLIX LANCET) KIT 1 Units by Does not apply route daily before breakfast.   meloxicam (MOBIC) 15 MG tablet TAKE 1 TABLET BY MOUTH  DAILY AS NEEDED FOR PAIN   metFORMIN (GLUCOPHAGE-XR) 750 MG 24 hr tablet TAKE 1 TABLET BY MOUTH  DAILY WITH BREAKFAST   mirabegron ER (MYRBETRIQ) 25 MG TB24 tablet Take 1 tablet (25 mg total) by mouth daily.   Multiple Vitamin (MULTIVITAMIN) tablet Take 1 tablet by mouth daily.   Olmesartan-amLODIPine-HCTZ 40-10-12.5 MG TABS TAKE 1 TABLET BY MOUTH  DAILY   pregabalin (LYRICA) 100 MG capsule Take 1 capsule (100 mg total) by mouth 2 (two) times daily.   traZODone (DESYREL) 50 MG tablet TAKE 1 TABLET BY MOUTH  EVERY NIGHT AT BEDTIME   No facility-administered encounter medications on file as of 05/12/2021.    Care Gaps: COVID-19 Vaccine (Dose 3- Booster for [employer] series)  Ophthalmology Exam (Last  Completed 12/10/2020) Influenza Vaccine Fecal DNA (Last Completed 03/23/2018) Foot Exam (Last Completed 05/02/2020)  Star Rating Drugs: Atorvastatin 20 mg last filled on 12/05/2020 for 90 day supply at [facility]. Metformin 750 mg last filled on 01/10/2021 for 90 day supply no pharmacy noted. Olmesartan-Amlodipine-HCTZ 40-10-12.5 mg last filled on 11/27/2020 for 90 day supply at [facility] Medication Fill Gaps: Baclofen  20 MG last filled on 12/19/2020 for 90 day supply Mirabegron ER 25 MG TB24 last filled on 10/17/2020 for 90 day supply  Recent Relevant Labs: Lab Results  Component Value Date/Time   HGBA1C 6.7 (H) 01/09/2021 01:20 PM   HGBA1C 6.0 (A) 05/02/2020 03:09 PM   HGBA1C 6.1 11/01/2019 11:43 AM   MICROALBUR 4.7 (H) 07/27/2016 12:57 PM    Kidney Function Lab Results  Component Value Date/Time   CREATININE 1.06 01/09/2021 01:20 PM   CREATININE 1.03 05/03/2020 11:11 AM   CREATININE 1.0 01/04/2014 09:43 AM   CREATININE 0.9 05/22/2013 11:15 AM   GFR 70.16 01/09/2021 01:20 PM   GFRNONAA >60 09/30/2019 02:24 AM   GFRNONAA >89 12/25/2011 11:32 AM   GFRAA >60 09/30/2019 02:24 AM   GFRAA >89 12/25/2011 11:32 AM    Current antihyperglycemic regimen:  Metformin XR 750 mg daily  What recent interventions/DTPs have been made to improve glycemic control:  None ID Have there been any recent hospitalizations or ED visits since last visit with CPP? No  I have  attempted without success to contact this patient by phone three times to do his Diabetes Disease State call. I left a Voice message for patient to return my call.  Lvm 12/12,12/15,12/16  Adherence Review: Is the patient currently on a STATIN medication? Yes Is the patient currently on ACE/ARB medication? Yes Does the patient have >5 day gap between last estimated fill dates? Yes   Telephone follow up appointment with care management team member scheduled for:  07/03/2021 at 10:00 AM  [place] Pharmacist Assistant [phone]

## 2021-05-23 DIAGNOSIS — G8220 Paraplegia, unspecified: Secondary | ICD-10-CM | POA: Diagnosis not present

## 2021-05-23 DIAGNOSIS — G35 Multiple sclerosis: Secondary | ICD-10-CM | POA: Diagnosis not present

## 2021-05-23 DIAGNOSIS — M4316 Spondylolisthesis, lumbar region: Secondary | ICD-10-CM | POA: Diagnosis not present

## 2021-05-23 DIAGNOSIS — M4802 Spinal stenosis, cervical region: Secondary | ICD-10-CM | POA: Diagnosis not present

## 2021-05-29 ENCOUNTER — Telehealth: Payer: Self-pay

## 2021-05-29 ENCOUNTER — Other Ambulatory Visit: Payer: Self-pay | Admitting: Family

## 2021-05-29 DIAGNOSIS — N3281 Overactive bladder: Secondary | ICD-10-CM

## 2021-05-29 MED ORDER — MIRABEGRON ER 25 MG PO TB24: 25.0000 mg | ORAL_TABLET | Freq: Every day | ORAL | 3 refills | Status: DC

## 2021-05-29 MED ORDER — OMEPRAZOLE 20 MG PO CPDR: 20.0000 mg | DELAYED_RELEASE_CAPSULE | Freq: Every day | ORAL | 0 refills | Status: DC

## 2021-05-29 NOTE — Telephone Encounter (Signed)
Patient requesting Omeprazole, states he has not took it in a long time but recently has been experiencing heartburn and would like to start taking it again. Pt also requesting a refill on Myrbetriq. Please advise

## 2021-05-30 NOTE — Telephone Encounter (Signed)
LVM on patient personal mailbox informing him that his Rx has been sent in.

## 2021-06-03 ENCOUNTER — Telehealth: Payer: Self-pay | Admitting: Nurse Practitioner

## 2021-06-03 DIAGNOSIS — M48061 Spinal stenosis, lumbar region without neurogenic claudication: Secondary | ICD-10-CM

## 2021-06-03 DIAGNOSIS — R269 Unspecified abnormalities of gait and mobility: Secondary | ICD-10-CM

## 2021-06-03 DIAGNOSIS — M5416 Radiculopathy, lumbar region: Secondary | ICD-10-CM

## 2021-06-03 DIAGNOSIS — G35 Multiple sclerosis: Secondary | ICD-10-CM

## 2021-06-03 DIAGNOSIS — G8220 Paraplegia, unspecified: Secondary | ICD-10-CM

## 2021-06-05 NOTE — Telephone Encounter (Signed)
LVM for patient to return call to let us know what type of care is needed.

## 2021-06-05 NOTE — Telephone Encounter (Signed)
Pt states he would like PT done will at home.

## 2021-06-06 ENCOUNTER — Telehealth: Payer: Self-pay

## 2021-06-06 NOTE — Progress Notes (Signed)
Chronic Care Management Pharmacy Assistant   Name: Larry [name]  MRN: [id number] DOB: 1950-11-11  Reason for Encounter:Hypertension Disease State Call.   Recent office visits:  No recent office visit  Recent consult visits:  No recent consult Visit  Hospital visits:  None in previous 6 months  Medications: Outpatient Encounter Medications as of 06/06/2021  Medication Sig   ACCU-CHEK AVIVA PLUS test strip TEST ONCE DAILY (IN THE   MORNING, FASTING)   Accu-Chek FastClix Lancets MISC    aspirin 81 MG tablet Take 81 mg by mouth daily.   atorvastatin (LIPITOR) 20 MG tablet TAKE 1 TABLET BY MOUTH 3  TIMES WEEKLY   baclofen (LIORESAL) 20 MG tablet TAKE 1 TABLET BY MOUTH 3  TIMES DAILY   Blood Glucose Calibration (ACCU-CHEK AVIVA) SOLN    ferrous sulfate 325 (65 FE) MG tablet Take 1 tablet (325 mg total) by mouth daily with breakfast.   finasteride (PROSCAR) 5 MG tablet TAKE 1 TABLET BY MOUTH  DAILY   Incontinence Supply Disposable (DEPEND ADJUSTABLE UNDERWEAR LG) MISC Use as needed.   Lancets Misc. (ACCU-CHEK FASTCLIX LANCET) KIT 1 Units by Does not apply route daily before breakfast.   meloxicam (MOBIC) 15 MG tablet TAKE 1 TABLET BY MOUTH  DAILY AS NEEDED FOR PAIN   metFORMIN (GLUCOPHAGE-XR) 750 MG 24 hr tablet TAKE 1 TABLET BY MOUTH  DAILY WITH BREAKFAST   mirabegron ER (MYRBETRIQ) 25 MG TB24 tablet Take 1 tablet (25 mg total) by mouth daily.   Multiple Vitamin (MULTIVITAMIN) tablet Take 1 tablet by mouth daily.   Olmesartan-amLODIPine-HCTZ 40-10-12.5 MG TABS TAKE 1 TABLET BY MOUTH  DAILY   omeprazole (PRILOSEC) 20 MG capsule Take 1 capsule (20 mg total) by mouth daily.   pregabalin (LYRICA) 100 MG capsule Take 1 capsule (100 mg total) by mouth 2 (two) times daily.   traZODone (DESYREL) 50 MG tablet TAKE 1 TABLET BY MOUTH  EVERY NIGHT AT BEDTIME   No facility-administered encounter medications on file as of 06/06/2021.    Care Gaps: COVID-19 Vaccine (Dose 3- Booster for [employer]  series)  Ophthalmology Exam (Last Completed 12/10/2020) Influenza Vaccine Fecal DNA (Last Completed 03/23/2018) Foot Exam (Last Completed 05/02/2020)   Star Rating Drugs: Atorvastatin 20 mg last filled on 05/21/2021 for 90 day supply at [facility]. Metformin 750 mg last filled on 05/01/2021 for 90 day supply no pharmacy noted. Olmesartan-Amlodipine-HCTZ 40-10-12.5 mg last filled on 12/132022 for 90 day supply at [facility] Medication Fill Gaps: None ID  Reviewed chart prior to disease state call. Spoke with patient regarding BP  Recent Office Vitals: BP Readings from Last 3 Encounters:  01/09/21 130/64  07/31/20 120/76  05/02/20 126/72   Pulse Readings from Last 3 Encounters:  01/09/21 78  07/31/20 76  05/02/20 78    Wt Readings from Last 3 Encounters:  01/09/21 169 lb 6.4 oz (76.8 kg)  07/31/20 167 lb (75.8 kg)  05/02/20 155 lb (70.3 kg)     Kidney Function Lab Results  Component Value Date/Time   CREATININE 1.06 01/09/2021 01:20 PM   CREATININE 1.03 05/03/2020 11:11 AM   CREATININE 1.0 01/04/2014 09:43 AM   CREATININE 0.9 05/22/2013 11:15 AM   GFR 70.16 01/09/2021 01:20 PM   GFRNONAA >60 09/30/2019 02:24 AM   GFRNONAA >89 12/25/2011 11:32 AM   GFRAA >60 09/30/2019 02:24 AM   GFRAA >89 12/25/2011 11:32 AM    BMP Latest Ref Rng & Units 01/09/2021 05/03/2020 11/01/2019  Glucose 70 - 99  mg/dL 115(H) 133(H) 117(H)  BUN 6 - 23 mg/dL 21 24(H) 15  Creatinine 0.40 - 1.50 mg/dL 1.06 1.03 0.86  Sodium 135 - 145 mEq/L 140 141 140  Potassium 3.5 - 5.1 mEq/L 3.9 3.9 4.1  Chloride 96 - 112 mEq/L 101 102 103  CO2 19 - 32 mEq/L '30 31 29  ' Calcium 8.4 - 10.5 mg/dL 10.0 9.8 9.6    Current antihypertensive regimen:  Olmesartan-Amlodipine-HCTZ 40-10-12.5 mg daily  What recent interventions/DTPs have been made by any provider to improve Blood Pressure control since last CPP Visit: None ID Any recent hospitalizations or ED visits since last visit with CPP? No  I have  attempted without success to contact this patient by phone three times to do his Hypertension Disease State call. I left a Voice message for patient to return my call.LVM 01/06,01/10,01/13.  Adherence Review: Is the patient currently on ACE/ARB medication? Yes Does the patient have >5 day gap between last estimated fill dates? No  Telephone follow up appointment with care management team member scheduled for:  07/03/2021 at 10:00 AM  [place] Pharmacist Assistant [phone]

## 2021-06-08 DIAGNOSIS — Z1211 Encounter for screening for malignant neoplasm of colon: Secondary | ICD-10-CM | POA: Diagnosis not present

## 2021-06-11 NOTE — Telephone Encounter (Signed)
Pt states he is okay waiting until after his 07/2021 appointment to be set up with [facility].

## 2021-06-11 NOTE — Telephone Encounter (Signed)
Larry [name]-- from what I can tell he had a telephone visit only is this right ? For [place] to be able to see a patient they need either a video visit or in person visit-- the provider has had to be able to lay eyes on the patient. I see he has an appt 07/14/21 in person with Charollte Niche NP. If he can get a video visit set up and done then we can see him, or we need to wait til this in person visit. Let me know if I'm missing anything on him.  This is the message I got for his HH. Can you confirm if he can be seen sooner, or if they are willing to wait until after his Feb appt.   Thanks

## 2021-07-02 ENCOUNTER — Telehealth: Payer: Self-pay

## 2021-07-02 NOTE — Progress Notes (Signed)
Chronic Care Management  APPOINTMENT REMINDER   Called Larry [name], No answer, left message of appointment on 07/03/2021 at 10:00 am via telephone visit with Junius Argyle , Pharm D. Notified to have all medications, supplements, blood pressure and/or blood sugar logs available during appointment and to return call if need to reschedule.  [place] Pharmacist Assistant [phone]

## 2021-07-03 ENCOUNTER — Ambulatory Visit (INDEPENDENT_AMBULATORY_CARE_PROVIDER_SITE_OTHER): Payer: Medicare Other

## 2021-07-03 DIAGNOSIS — I10 Essential (primary) hypertension: Secondary | ICD-10-CM

## 2021-07-03 DIAGNOSIS — E1142 Type 2 diabetes mellitus with diabetic polyneuropathy: Secondary | ICD-10-CM

## 2021-07-03 NOTE — Progress Notes (Signed)
Chronic Care Management Pharmacy Note  07/17/2021 Name:  Larry [name] MRN:  [id number] DOB:  1951-02-27  Summary: Patient presents for CCM follow-up. Concerned about potential arthritis in hands. Describes them as getting "stuck." Denies inflammation, pain   Recommendations/Changes made from today's visit: Continue current medications  Plan: CPP follow-up 6 months   Subjective: Larry [name] is an 71 y.o. year old male who is a primary patient of Nche, Charlene Brooke, NP.  The CCM team was consulted for assistance with disease management and care coordination needs.    Engaged with patient by telephone for follow up visit in response to provider referral for pharmacy case management and/or care coordination services.   Consent to Services:  The patient was given information about Chronic Care Management services, agreed to services, and gave verbal consent prior to initiation of services.  Please see initial visit note for detailed documentation.   Patient Care Team: Nche, Charlene Brooke, NP as PCP - General (Internal Medicine) Kristeen Miss, MD as Consulting Physician (Neurosurgery) Kristeen Miss, MD as Consulting Physician (Neurosurgery) Germaine Pomfret, [facility] as Pharmacist (Pharmacist)  Recent office visits: 01/09/21: Patient presented to Wilfred Lacy, NP for follow-up.   Recent consult visits: None in previous 6 months  Hospital visits: None in previous 6 months   Objective:  Lab Results  Component Value Date   CREATININE 1.07 07/14/2021   BUN 27 (H) 07/14/2021   GFR 69.12 07/14/2021   GFRNONAA >60 09/30/2019   GFRAA >60 09/30/2019   NA 141 07/14/2021   K 4.1 07/14/2021   CALCIUM 10.0 07/14/2021   CO2 32 07/14/2021   GLUCOSE 102 (H) 07/14/2021    Lab Results  Component Value Date/Time   HGBA1C 6.5 07/14/2021 02:44 PM   HGBA1C 6.7 (H) 01/09/2021 01:20 PM   GFR 69.12 07/14/2021 02:44 PM   GFR 70.16 01/09/2021 01:20 PM   MICROALBUR 4.7 (H)  07/27/2016 12:57 PM    Last diabetic Eye exam:  Lab Results  Component Value Date/Time   HMDIABEYEEXA No Retinopathy 12/11/2019 12:00 AM    Last diabetic Foot exam: No results found for: HMDIABFOOTEX   Lab Results  Component Value Date   CHOL 176 11/01/2019   HDL 49.50 11/01/2019   LDLCALC 113 (H) 11/01/2019   LDLDIRECT 64.0 01/09/2021   TRIG 69.0 11/01/2019   CHOLHDL 4 11/01/2019    Hepatic Function Latest Ref Rng & Units 01/09/2021 11/01/2019 09/30/2019  Total Protein 6.0 - 8.3 g/dL 6.9 6.4 5.4(L)  Albumin 3.5 - 5.2 g/dL 4.7 4.6 2.9(L)  AST 0 - 37 U/L 28 19 73(H)  ALT 0 - 53 U/L 16 9 48(H)  Alk Phosphatase 39 - 117 U/L 97 66 61  Total Bilirubin 0.2 - 1.2 mg/dL 0.5 0.4 0.7  Bilirubin, Direct 0.0 - 0.3 mg/dL 0.1 - -    Lab Results  Component Value Date/Time   TSH 1.15 07/31/2020 12:21 PM   TSH 1.46 03/24/2019 03:55 PM    CBC Latest Ref Rng & Units 07/14/2021 01/09/2021 07/31/2020  WBC 4.0 - 10.5 K/uL 5.7 3.9(L) 4.1  Hemoglobin 13.0 - 17.0 g/dL 12.6(L) 12.8(L) 12.2(L)  Hematocrit 39.0 - 52.0 % 38.1(L) 38.8(L) 36.3(L)  Platelets 150.0 - 400.0 K/uL 261.0 281.0 264.0    No results found for: VD25OH  Clinical ASCVD: No  The ASCVD Risk score (Arnett DK, et al., 2019) failed to calculate for the following reasons:   Unable to determine if patient is Non-Hispanic African American    Depression screen PHQ  2/9 04/30/2021 04/30/2021 11/19/2020  Decreased Interest 0 0 0  Down, Depressed, Hopeless 0 0 0  PHQ - 2 Score 0 0 0  Some recent data might be hidden    Social History   Tobacco Use  Smoking Status Never  Smokeless Tobacco Never   BP Readings from Last 3 Encounters:  07/14/21 124/60  01/09/21 130/64  07/31/20 120/76   Pulse Readings from Last 3 Encounters:  07/14/21 86  01/09/21 78  07/31/20 76   Wt Readings from Last 3 Encounters:  07/14/21 169 lb (76.7 kg)  01/09/21 169 lb 6.4 oz (76.8 kg)  07/31/20 167 lb (75.8 kg)   BMI Readings from Last 3 Encounters:   07/14/21 26.47 kg/m  01/09/21 26.53 kg/m  07/31/20 26.16 kg/m    Assessment/Interventions: Review of patient past medical history, allergies, medications, health status, including review of consultants reports, laboratory and other test data, was performed as part of comprehensive evaluation and provision of chronic care management services.   SDOH:  (Social Determinants of Health) assessments and interventions performed: Yes SDOH Interventions    Flowsheet Row Most Recent Value  SDOH Interventions   Financial Strain Interventions Intervention Not Indicated       SDOH Screenings   Alcohol Screen: Low Risk    Last Alcohol Screening Score (AUDIT): 0  Depression (PHQ2-9): Low Risk    PHQ-2 Score: 0  Financial Resource Strain: Low Risk    Difficulty of Paying Living Expenses: Not hard at all  Food Insecurity: No Food Insecurity   Worried About Charity fundraiser in the Last Year: Never true   Ran Out of Food in the Last Year: Never true  Housing: Low Risk    Last Housing Risk Score: 0  Physical Activity: Insufficiently Active   Days of Exercise per Week: 2 days   Minutes of Exercise per Session: 20 min  Social Connections: Moderately Integrated   Frequency of Communication with Friends and Family: Twice a week   Frequency of Social Gatherings with Friends and Family: Twice a week   Attends Religious Services: More than 4 times per year   Active Member of [employer] or Organizations: Yes   Attends Music therapist: More than 4 times per year   Marital Status: Divorced  Stress: No Stress Concern Present   Feeling of Stress : Not at all  Tobacco Use: Low Risk    Smoking Tobacco Use: Never   Smokeless Tobacco Use: Never   Passive Exposure: Not on file  Transportation Needs: No Transportation Needs   Lack of Transportation (Medical): No   Lack of Transportation (Non-Medical): No    CCM Care Plan  Allergies  Allergen Reactions   No Known Allergies     Rosuvastatin Other (See Comments)    Leg swelling    Medications Reviewed Today     Reviewed by Arcelia Jew, [place] (Certified Medical Assistant) on 07/14/21 at 1405  Med List Status: <None>   Medication Order Taking? Sig Documenting Provider Last Dose Status Informant  ACCU-CHEK AVIVA PLUS test strip [id number] Yes TEST ONCE DAILY (IN THE   MORNING, FASTING) Nche, Charlene Brooke, NP Taking Active   Accu-Chek FastClix Lancets MISC [id number] Yes  [provider] Taking Active Self  aspirin 81 MG tablet [id number] Yes Take 81 mg by mouth daily. [provider] Taking Active Self  atorvastatin (LIPITOR) 20 MG tablet [id number] Yes TAKE 1 TABLET BY MOUTH 3  TIMES WEEKLY Nche, Charlene Brooke, NP Taking Active  baclofen (LIORESAL) 20 MG tablet [id number] Yes TAKE 1 TABLET BY MOUTH 3  TIMES DAILY Marcial Pacas, MD Taking Active Self  Blood Glucose Calibration (ACCU-CHEK AVIVA) SOLN [id number] Yes  [provider] Taking Active Self  ferrous sulfate 325 (65 FE) MG tablet [id number] Yes Take 1 tablet (325 mg total) by mouth daily with breakfast. Nche, Charlene Brooke, NP Taking Active   finasteride (PROSCAR) 5 MG tablet [id number] Yes TAKE 1 TABLET BY MOUTH  DAILY Nche, Charlene Brooke, NP Taking Active   Incontinence Supply Disposable (DEPEND ADJUSTABLE UNDERWEAR LG) MISC [id number] Yes Use as needed. Marcial Pacas, MD Taking Active Self  Lancets Misc. (ACCU-CHEK FASTCLIX LANCET) KIT [id number] Yes 1 Units by Does not apply route daily before breakfast. Nche, Charlene Brooke, NP Taking Active Self  meloxicam (MOBIC) 15 MG tablet [id number] Yes TAKE 1 TABLET BY MOUTH  DAILY AS NEEDED FOR PAIN Nche, Charlene Brooke, NP Taking Active   metFORMIN (GLUCOPHAGE-XR) 750 MG 24 hr tablet [id number] Yes TAKE 1 TABLET BY MOUTH  DAILY WITH BREAKFAST Nche, Charlene Brooke, NP Taking Active   mirabegron ER (MYRBETRIQ) 25 MG TB24 tablet [id number] Yes Take 1 tablet (25 mg total) by mouth daily. Kennyth Arnold, FNP Taking  Active   Multiple Vitamin (MULTIVITAMIN) tablet [id number] Yes Take 1 tablet by mouth daily. [provider] Taking Active Self  Olmesartan-amLODIPine-HCTZ 40-10-12.5 MG TABS [id number] Yes TAKE 1 TABLET BY MOUTH  DAILY Nche, Charlene Brooke, NP Taking Active   omeprazole (PRILOSEC) 20 MG capsule [id number] Yes Take 1 capsule (20 mg total) by mouth daily. Dutch Quint B, FNP Taking Active   pregabalin (LYRICA) 100 MG capsule [id number] Yes Take 1 capsule (100 mg total) by mouth 2 (two) times daily. Flossie Buffy, NP Taking Active   traZODone (DESYREL) 50 MG tablet [id number] Yes TAKE 1 TABLET BY MOUTH  EVERY NIGHT AT BEDTIME Nche, Charlene Brooke, NP Taking Active             Patient Active Problem List   Diagnosis Date Noted   History of pulmonary embolism 07/15/2021   Bilateral leg edema 02/09/2020   Elevated LDL cholesterol level 11/09/2019   Pressure injury of skin 09/25/2019   Benign prostatic hyperplasia with lower urinary tract symptoms 09/14/2019   Cervical stenosis of spinal canal 05/09/2019   Thoracic spinal stenosis 05/09/2019   Gait abnormality 08/08/2018   Spinal stenosis 08/08/2018   Iron deficiency anemia 02/09/2018   Bowel incontinence 01/01/2017   Vitamin D deficiency 01/11/2016   GERD (gastroesophageal reflux disease) 01/11/2016   Spinal stenosis, lumbar region, with neurogenic claudication 01/10/2016   Spinal stenosis of lumbar region with radiculopathy 01/10/2016   Elective surgery    Tachycardia    Peripheral neuropathy    Spondylolisthesis of lumbar region 12/30/2015   CTS (carpal tunnel syndrome) 09/26/2015   Lumbar stenosis 09/26/2015   Weakness 09/05/2015   Spinal stenosis in cervical region 06/12/2015   Leukocytopenia 05/22/2013   History of spinal cord injury 12/12/2011   Paraplegia ([place]) 12/11/2011   Altered mental status 12/11/2011   DM (diabetes mellitus) ([place]) 12/11/2011   Overactive bladder 12/11/2011   MS (multiple sclerosis) ([place])     Hypertension     Immunization History  Administered Date(s) Administered   Fluad Quad(high Dose 65+) 03/24/2019, 05/02/2020   Influenza Split 02/05/2012   Influenza, High Dose Seasonal PF 03/05/2014, 05/17/2015, 02/15/2017, 06/10/2018   Influenza,inj,Quad PF,6+ Mos 05/09/2013   PFIZER(Purple Top)SARS-COV-2 Vaccination 01/14/2020, 02/04/2020   PPD Test 01/05/2016  Pneumococcal Conjugate-13 03/05/2014   Pneumococcal Polysaccharide-23 05/17/2015    Conditions to be addressed/monitored:  Hypertension, Hyperlipidemia, Diabetes, GERD, Overactive Bladder, and BPH  Care Plan : General Pharmacy (Adult)  Updates made by Germaine Pomfret, RPH since 07/17/2021 12:00 AM     Problem: Hypertension, Hyperlipidemia, Diabetes, GERD, Overactive Bladder, and BPH   Priority: High     Long-Range Goal: Patient-Specific Goal   Start Date: 01/13/2021  Expected End Date: 07/17/2022  This Visit's Progress: On track  Recent Progress: On track  Priority: High  Note:   Current Barriers:  No Barriers noted  Pharmacist Clinical Goal(s):  Patient will maintain control of blood pressure as evidenced by BP less than 140/90  through collaboration with PharmD and provider.   Interventions: 1:1 collaboration with Nche, Charlene Brooke, NP regarding development and update of comprehensive plan of care as evidenced by provider attestation and co-signature Inter-disciplinary care team collaboration (see longitudinal plan of care) Comprehensive medication review performed; medication list updated in electronic medical record  Hypertension (BP goal <140/90) -Controlled -Current treatment: Olmesartan-Amlodipine-HCTZ 40-10-12.5 mg daily: Appropriate, Effective, Safe, Accessible -Medications previously tried: NA  -Current home readings: 123/44  -Current dietary habits: Limits salt intake. -Current exercise habits: PT multiple times weekly -Denies hypotensive/hypertensive symptoms -Counseled to monitor BP at  home weekly, document, and provide log at future appointments -Recommended to continue current medication  Hyperlipidemia: (LDL goal < 100) -Controlled -Current treatment: Atorvastatin 20 mg three times weekly -Medications previously tried: NA  -Educated on Benefits of statin for ASCVD risk reduction; -Recommended to continue current medication  Diabetes (A1c goal <7%) -Controlled -Current medications: Metformin XR 750 mg daily: Appropriate, Effective, Safe, Accessible  -Medications previously tried: NA  -Current home glucose readings fasting glucose: 113 -Denies hypoglycemic/hyperglycemic symptoms -Educated on A1c and blood sugar goals; -Counseled to check feet daily and get yearly eye exams -Recommended to continue current medication  Patient Goals/Self-Care Activities Patient will:  - check glucose weekly before breakfast, document, and provide at future appointments check blood pressure weekly, document, and provide at future appointments  Follow Up Plan: Telephone follow up appointment with care management team member scheduled for:  02/05/2022 at 11:00 AM       Medication Assistance: None required.  Patient affirms current coverage meets needs.  Compliance/Adherence/Medication fill history: Care Gaps: Covid Booster Opthalmology Exam Influenza  Star-Rating Drugs: Atorvastatin LF 05/21/21 for 90-DS Olmesartan LF 05/13/21 for 90-DS Metformin LF 05/01/21 for 90-DS  Patient's preferred pharmacy is:  [facility] ([place], [place] [facility] [address] [place] [address] Phone: [phone] Fax: [phone]  [facility] ([facility] ) - [place], [place] [address] Phone: [phone] Fax: [phone]   Uses pill box? Yes Pt endorses 100% compliance  We discussed: Current pharmacy is preferred with insurance plan and  patient is satisfied with pharmacy services Patient decided to: Continue current medication management strategy  Care Plan and Follow Up Patient Decision:  Patient agrees to Care Plan and Follow-up.  Plan: Telephone follow up appointment with care management team member scheduled for:  02/05/2022 at 11:00 AM  Junius Argyle, PharmD, Para [place], CPP Clinical Pharmacist [facility]  [phone]

## 2021-07-14 ENCOUNTER — Encounter: Payer: Self-pay | Admitting: Nurse Practitioner

## 2021-07-14 ENCOUNTER — Ambulatory Visit (INDEPENDENT_AMBULATORY_CARE_PROVIDER_SITE_OTHER): Payer: Medicare Other | Admitting: Nurse Practitioner

## 2021-07-14 ENCOUNTER — Other Ambulatory Visit: Payer: Self-pay

## 2021-07-14 VITALS — BP 124/60 | HR 86 | Temp 97.0°F | Ht 67.0 in | Wt 169.0 lb

## 2021-07-14 DIAGNOSIS — N401 Enlarged prostate with lower urinary tract symptoms: Secondary | ICD-10-CM

## 2021-07-14 DIAGNOSIS — K219 Gastro-esophageal reflux disease without esophagitis: Secondary | ICD-10-CM

## 2021-07-14 DIAGNOSIS — I152 Hypertension secondary to endocrine disorders: Secondary | ICD-10-CM | POA: Diagnosis not present

## 2021-07-14 DIAGNOSIS — E7800 Pure hypercholesterolemia, unspecified: Secondary | ICD-10-CM

## 2021-07-14 DIAGNOSIS — M4802 Spinal stenosis, cervical region: Secondary | ICD-10-CM

## 2021-07-14 DIAGNOSIS — Z86711 Personal history of pulmonary embolism: Secondary | ICD-10-CM

## 2021-07-14 DIAGNOSIS — G4700 Insomnia, unspecified: Secondary | ICD-10-CM

## 2021-07-14 DIAGNOSIS — G629 Polyneuropathy, unspecified: Secondary | ICD-10-CM

## 2021-07-14 DIAGNOSIS — Z1211 Encounter for screening for malignant neoplasm of colon: Secondary | ICD-10-CM | POA: Diagnosis not present

## 2021-07-14 DIAGNOSIS — R269 Unspecified abnormalities of gait and mobility: Secondary | ICD-10-CM | POA: Diagnosis not present

## 2021-07-14 DIAGNOSIS — G8220 Paraplegia, unspecified: Secondary | ICD-10-CM | POA: Diagnosis not present

## 2021-07-14 DIAGNOSIS — E1142 Type 2 diabetes mellitus with diabetic polyneuropathy: Secondary | ICD-10-CM | POA: Diagnosis not present

## 2021-07-14 DIAGNOSIS — M48062 Spinal stenosis, lumbar region with neurogenic claudication: Secondary | ICD-10-CM

## 2021-07-14 DIAGNOSIS — R531 Weakness: Secondary | ICD-10-CM | POA: Diagnosis not present

## 2021-07-14 DIAGNOSIS — D509 Iron deficiency anemia, unspecified: Secondary | ICD-10-CM

## 2021-07-14 MED ORDER — OLMESARTAN-AMLODIPINE-HCTZ 40-10-12.5 MG PO TABS: 1.0000 | ORAL_TABLET | Freq: Every day | ORAL | 3 refills | Status: DC

## 2021-07-14 MED ORDER — ATORVASTATIN CALCIUM 20 MG PO TABS: 20.0000 mg | ORAL_TABLET | Freq: Every day | ORAL | 3 refills | Status: DC

## 2021-07-14 MED ORDER — OMEPRAZOLE 20 MG PO CPDR: 20.0000 mg | DELAYED_RELEASE_CAPSULE | Freq: Every day | ORAL | 0 refills | Status: DC

## 2021-07-14 MED ORDER — PREGABALIN 100 MG PO CAPS: 100.0000 mg | ORAL_CAPSULE | Freq: Two times a day (BID) | ORAL | 3 refills | Status: DC

## 2021-07-14 MED ORDER — TRAZODONE HCL 50 MG PO TABS: 50.0000 mg | ORAL_TABLET | Freq: Every day | ORAL | 3 refills | Status: DC

## 2021-07-14 MED ORDER — MELOXICAM 15 MG PO TABS: 15.0000 mg | ORAL_TABLET | Freq: Every day | ORAL | 3 refills | Status: DC | PRN

## 2021-07-14 MED ORDER — METFORMIN HCL ER 750 MG PO TB24: 750.0000 mg | ORAL_TABLET | Freq: Every day | ORAL | 3 refills | Status: DC

## 2021-07-14 MED ORDER — FINASTERIDE 5 MG PO TABS: 5.0000 mg | ORAL_TABLET | Freq: Every day | ORAL | 3 refills | Status: DC

## 2021-07-14 MED ORDER — BACLOFEN 20 MG PO TABS: 20.0000 mg | ORAL_TABLET | Freq: Three times a day (TID) | ORAL | 3 refills | Status: DC

## 2021-07-14 NOTE — Progress Notes (Signed)
Subjective:  Patient ID: Larry [name], male    DOB: 09/22/1950  Age: 71 y.o. MRN: [id number]  CC: Follow-up (6 month f/u on DM, HTN, and cholesterol. /Pt would like a referral for home PT/Pt states he would like to discuss possible medication for stiffness of his hands. )  HPI Accompanied by daughter.  Weakness Chronic muscle weakness and unsteady gait due to spinal stenosis and myelopathy. Associated with muscle atrophy. Use of manual wheelchair and able to transfer from bed to chair. He denies any pain Previous home and outpatient PT sessions have been beneficial to improve strength and balance. He reports difficulty maintaining home exercises and requesting for another home health PT referral. He declined referral to outpatient PT due to mobility and transportation limitations.  Entered HHPT referral  DM (diabetes mellitus) ([place]) Fasting Glucose:113 Current use of metformin. Neuropathic pain controlled with lyrica.  Advised to schedule Dm foot exam Repeat BMP and hgbA1c Maintain med doses  Hypertension BP at goal BP Readings from Last 3 Encounters:  07/14/21 124/60  01/09/21 130/64  07/31/20 120/76   Repeat BMP maintain med dose  Benign prostatic hyperplasia with lower urinary tract symptoms Stable with proscar Normal PSA Med refill sent  Reviewed past Medical, Social and Family history today.  Outpatient Medications Prior to Visit  Medication Sig Dispense Refill   ACCU-CHEK AVIVA PLUS test strip TEST ONCE DAILY (IN THE   MORNING, FASTING) 100 strip 3   Accu-Chek FastClix Lancets MISC      aspirin 81 MG tablet Take 81 mg by mouth daily.     Blood Glucose Calibration (ACCU-CHEK AVIVA) SOLN      ferrous sulfate 325 (65 FE) MG tablet Take 1 tablet (325 mg total) by mouth daily with breakfast. 90 tablet 3   Incontinence Supply Disposable (DEPEND ADJUSTABLE UNDERWEAR LG) MISC Use as needed. 30 each 12   Lancets Misc. (ACCU-CHEK FASTCLIX LANCET) KIT 1 Units by Does  not apply route daily before breakfast. 1 kit 11   mirabegron ER (MYRBETRIQ) 25 MG TB24 tablet Take 1 tablet (25 mg total) by mouth daily. 90 tablet 3   Multiple Vitamin (MULTIVITAMIN) tablet Take 1 tablet by mouth daily.     atorvastatin (LIPITOR) 20 MG tablet TAKE 1 TABLET BY MOUTH 3  TIMES WEEKLY 39 tablet 3   baclofen (LIORESAL) 20 MG tablet TAKE 1 TABLET BY MOUTH 3  TIMES DAILY 270 tablet 3   finasteride (PROSCAR) 5 MG tablet TAKE 1 TABLET BY MOUTH  DAILY 90 tablet 3   meloxicam (MOBIC) 15 MG tablet TAKE 1 TABLET BY MOUTH  DAILY AS NEEDED FOR PAIN 90 tablet 3   metFORMIN (GLUCOPHAGE-XR) 750 MG 24 hr tablet TAKE 1 TABLET BY MOUTH  DAILY WITH BREAKFAST 90 tablet 3   Olmesartan-amLODIPine-HCTZ 40-10-12.5 MG TABS TAKE 1 TABLET BY MOUTH  DAILY 90 tablet 3   omeprazole (PRILOSEC) 20 MG capsule Take 1 capsule (20 mg total) by mouth daily. 90 capsule 0   pregabalin (LYRICA) 100 MG capsule Take 1 capsule (100 mg total) by mouth 2 (two) times daily. 180 capsule 3   traZODone (DESYREL) 50 MG tablet TAKE 1 TABLET BY MOUTH  EVERY NIGHT AT BEDTIME 90 tablet 3   No facility-administered medications prior to visit.   ROS See HPI  Objective:  BP 124/60 (BP Location: Left Arm, Patient Position: Sitting, Cuff Size: Normal)    Pulse 86    Temp (!) 97 F (36.1 C) (Temporal)    Ht  5' 7" (1.702 m)    Wt 169 lb (76.7 kg)    SpO2 98%    BMI 26.47 kg/m   Physical Exam Cardiovascular:     Pulses:          Dorsalis pedis pulses are 2+ on the right side and 2+ on the left side.  Musculoskeletal:     Right foot: Normal range of motion. Foot drop present. No bunion or prominent metatarsal heads.     Left foot: Normal range of motion. Foot drop present. No bunion or prominent metatarsal heads.  Feet:     Right foot:     Protective Sensation: 5 sites tested.  0 sites sensed.     Skin integrity: Skin integrity normal.     Toenail Condition: Right toenails are abnormally thick.     Left foot:     Protective  Sensation: 5 sites tested.  0 sites sensed.     Skin integrity: Skin integrity normal.     Toenail Condition: Left toenails are abnormally thick.   Assessment & Plan:  This visit occurred during the SARS-CoV-2 public health emergency.  Safety protocols were in place, including screening questions prior to the visit, additional usage of staff PPE, and extensive cleaning of exam room while observing appropriate contact time as indicated for disinfecting solutions.   Larry [name] was seen today for follow-up.  Diagnoses and all orders for this visit:  Type 2 diabetes mellitus with diabetic polyneuropathy, without long-term current use of insulin (HCC) -     Hemoglobin A1c -     Basic metabolic panel -     metFORMIN (GLUCOPHAGE-XR) 750 MG 24 hr tablet; Take 1 tablet (750 mg total) by mouth daily with breakfast.  Peripheral polyneuropathy -     pregabalin (LYRICA) 100 MG capsule; Take 1 capsule (100 mg total) by mouth 2 (two) times daily.  Paraplegia ([place]) -     Ambulatory referral to Home Health -     baclofen (LIORESAL) 20 MG tablet; Take 1 tablet (20 mg total) by mouth 3 (three) times daily.  Spinal stenosis, lumbar region, with neurogenic claudication -     Ambulatory referral to [facility] [place] -     baclofen (LIORESAL) 20 MG tablet; Take 1 tablet (20 mg total) by mouth 3 (three) times daily. -     meloxicam (MOBIC) 15 MG tablet; Take 1 tablet (15 mg total) by mouth daily as needed. for pain  Spinal stenosis in cervical region -     Ambulatory referral to [place] -     baclofen (LIORESAL) 20 MG tablet; Take 1 tablet (20 mg total) by mouth 3 (three) times daily. -     meloxicam (MOBIC) 15 MG tablet; Take 1 tablet (15 mg total) by mouth daily as needed. for pain  Gait abnormality -     Ambulatory referral to Home Health  Weakness -     Ambulatory referral to [place] [facility] for colon cancer -     Cologuard  Iron deficiency anemia, unspecified iron deficiency anemia type -      CBC with Differential/Platelet  Elevated LDL cholesterol level -     atorvastatin (LIPITOR) 20 MG tablet; Take 1 tablet (20 mg total) by mouth daily.  Benign prostatic hyperplasia with lower urinary tract symptoms, symptom details unspecified -     finasteride (PROSCAR) 5 MG tablet; Take 1 tablet (5 mg total) by mouth daily.  Hypertension due to endocrine disorder -  Olmesartan-amLODIPine-HCTZ 40-10-12.5 MG TABS; Take 1 tablet by mouth daily.  Insomnia, unspecified type -     traZODone (DESYREL) 50 MG tablet; Take 1 tablet (50 mg total) by mouth at bedtime.  Gastroesophageal reflux disease without esophagitis -     omeprazole (PRILOSEC) 20 MG capsule; Take 1 capsule (20 mg total) by mouth daily.  History of pulmonary embolism   Problem List Items Addressed This Visit       Cardiovascular and Mediastinum   Hypertension    BP at goal BP Readings from Last 3 Encounters:  07/14/21 124/60  01/09/21 130/64  07/31/20 120/76   Repeat BMP maintain med dose      Relevant Medications   atorvastatin (LIPITOR) 20 MG tablet   Olmesartan-amLODIPine-HCTZ 40-10-12.5 MG TABS     Digestive   GERD (gastroesophageal reflux disease)   Relevant Medications   omeprazole (PRILOSEC) 20 MG capsule     Endocrine   DM (diabetes mellitus) ([place]) - Primary    Fasting Glucose:113 Current use of metformin. Neuropathic pain controlled with lyrica.  Advised to schedule Dm foot exam Repeat BMP and hgbA1c Maintain med doses      Relevant Medications   atorvastatin (LIPITOR) 20 MG tablet   metFORMIN (GLUCOPHAGE-XR) 750 MG 24 hr tablet   Olmesartan-amLODIPine-HCTZ 40-10-12.5 MG TABS   Other Relevant Orders   Hemoglobin A2Q   Basic metabolic panel     Nervous and Auditory   Paraplegia (HCC)   Relevant Medications   baclofen (LIORESAL) 20 MG tablet   Other Relevant Orders   Ambulatory referral to [place]   Peripheral neuropathy   Relevant Medications   baclofen (LIORESAL) 20 MG  tablet   pregabalin (LYRICA) 100 MG capsule   traZODone (DESYREL) 50 MG tablet     Genitourinary   Benign prostatic hyperplasia with lower urinary tract symptoms    Stable with proscar Normal PSA Med refill sent      Relevant Medications   finasteride (PROSCAR) 5 MG tablet     Other   Elevated LDL cholesterol level   Relevant Medications   atorvastatin (LIPITOR) 20 MG tablet   Gait abnormality   Relevant Orders   Ambulatory referral to [place]   History of pulmonary embolism   Iron deficiency anemia   Relevant Orders   CBC with Differential/Platelet   Spinal stenosis in cervical region   Relevant Medications   baclofen (LIORESAL) 20 MG tablet   meloxicam (MOBIC) 15 MG tablet   Other Relevant Orders   Ambulatory referral to [place]   Spinal stenosis, lumbar region, with neurogenic claudication   Relevant Medications   baclofen (LIORESAL) 20 MG tablet   meloxicam (MOBIC) 15 MG tablet   pregabalin (LYRICA) 100 MG capsule   traZODone (DESYREL) 50 MG tablet   Other Relevant Orders   Ambulatory referral to Home Health   Weakness    Chronic muscle weakness and unsteady gait due to spinal stenosis and myelopathy. Associated with muscle atrophy. Use of manual wheelchair and able to transfer from bed to chair. He denies any pain Previous home and outpatient PT sessions have been beneficial to improve strength and balance. He reports difficulty maintaining home exercises and requesting for another home health PT referral. He declined referral to outpatient PT due to mobility and transportation limitations.  Entered HHPT referral      Relevant Orders   Ambulatory referral to [place]   Other Visit Diagnoses     Screening for colon cancer  Relevant Orders   Cologuard   Insomnia, unspecified type       Relevant Medications   traZODone (DESYREL) 50 MG tablet       Follow-up: Return in about 6 months (around 01/11/2022) for DM and HTN, hyperlipidemia  (fasting).  Wilfred Lacy, NP

## 2021-07-14 NOTE — Patient Instructions (Addendum)
Go to lab for blood draw  Schedule appt for annual eye exam

## 2021-07-15 DIAGNOSIS — Z86711 Personal history of pulmonary embolism: Secondary | ICD-10-CM | POA: Insufficient documentation

## 2021-07-15 LAB — CBC WITH DIFFERENTIAL/PLATELET
Basophils Absolute: 0 10*3/uL (ref 0.0–0.1)
Basophils Relative: 0.8 % (ref 0.0–3.0)
Eosinophils Absolute: 0.1 10*3/uL (ref 0.0–0.7)
Eosinophils Relative: 1.7 % (ref 0.0–5.0)
HCT: 38.1 % — ABNORMAL LOW (ref 39.0–52.0)
Hemoglobin: 12.6 g/dL — ABNORMAL LOW (ref 13.0–17.0)
Lymphocytes Relative: 13.3 % (ref 12.0–46.0)
Lymphs Abs: 0.8 10*3/uL (ref 0.7–4.0)
MCHC: 33 g/dL (ref 30.0–36.0)
MCV: 93.4 fl (ref 78.0–100.0)
Monocytes Absolute: 0.2 10*3/uL (ref 0.1–1.0)
Monocytes Relative: 3.9 % (ref 3.0–12.0)
Neutro Abs: 4.6 10*3/uL (ref 1.4–7.7)
Neutrophils Relative %: 80.3 % — ABNORMAL HIGH (ref 43.0–77.0)
Platelets: 261 10*3/uL (ref 150.0–400.0)
RBC: 4.07 Mil/uL — ABNORMAL LOW (ref 4.22–5.81)
RDW: 14.9 % (ref 11.5–15.5)
WBC: 5.7 10*3/uL (ref 4.0–10.5)

## 2021-07-15 LAB — HEMOGLOBIN A1C: Hgb A1c MFr Bld: 6.5 % (ref 4.6–6.5)

## 2021-07-15 NOTE — Assessment & Plan Note (Signed)
Stable with proscar Normal PSA Med refill sent

## 2021-07-15 NOTE — Assessment & Plan Note (Signed)
Fasting Glucose:113 Current use of metformin. Neuropathic pain controlled with lyrica.  Advised to schedule Dm foot exam Repeat BMP and hgbA1c Maintain med doses

## 2021-07-15 NOTE — Assessment & Plan Note (Signed)
BP at goal BP Readings from Last 3 Encounters:  07/14/21 124/60  01/09/21 130/64  07/31/20 120/76   Repeat BMP maintain med dose

## 2021-07-15 NOTE — Assessment & Plan Note (Signed)
>>  ASSESSMENT AND PLAN FOR WEAKNESS WRITTEN ON 07/15/2021 10:02 AM BY Mikhail Hallenbeck LUM, NP  Chronic muscle weakness and unsteady gait due to spinal stenosis and myelopathy. Associated with muscle atrophy. Use of manual wheelchair and able to transfer from bed to chair. He denies any pain Previous home and outpatient PT sessions have been beneficial to improve strength and balance. He reports difficulty maintaining home exercises and requesting for another home health PT referral. He declined referral to outpatient PT due to mobility and transportation limitations.  Entered HHPT referral

## 2021-07-15 NOTE — Assessment & Plan Note (Addendum)
Chronic muscle weakness and unsteady gait due to spinal stenosis and myelopathy. Associated with muscle atrophy. Use of manual wheelchair and able to transfer from bed to chair. He denies any pain Previous home and outpatient PT sessions have been beneficial to improve strength and balance. He reports difficulty maintaining home exercises and requesting for another home health PT referral. He declined referral to outpatient PT due to mobility and transportation limitations.  Entered HHPT referral

## 2021-07-16 LAB — BASIC METABOLIC PANEL
BUN: 27 mg/dL — ABNORMAL HIGH (ref 6–23)
CO2: 32 mEq/L (ref 19–32)
Calcium: 10 mg/dL (ref 8.4–10.5)
Chloride: 104 mEq/L (ref 96–112)
Creatinine, Ser: 1.07 mg/dL (ref 0.40–1.50)
GFR: 69.12 mL/min (ref >60.00)
Glucose, Bld: 102 mg/dL — ABNORMAL HIGH (ref 70–99)
Potassium: 4.1 mEq/L (ref 3.5–5.1)
Sodium: 141 mEq/L (ref 135–145)

## 2021-07-17 NOTE — Patient Instructions (Addendum)
Visit Information It was great speaking with you today!  Please let me know if you have any questions about our visit.   Goals Addressed             This Visit's Progress    Track and Manage My Blood Pressure-Hypertension   On track    Timeframe:  Long-Range Goal Priority:  High Start Date:  01/13/2021                            Expected End Date:  01/13/2022                     Follow Up Date 03/30/2021    - check blood pressure weekly    Why is this important?   You won't feel high blood pressure, but it can still hurt your blood vessels.  High blood pressure can cause heart or kidney problems. It can also cause a stroke.  Making lifestyle changes like losing a little weight or eating less salt will help.  Checking your blood pressure at home and at different times of the day can help to control blood pressure.  If the doctor prescribes medicine remember to take it the way the doctor ordered.  Call the office if you cannot afford the medicine or if there are questions about it.     Notes:         Patient Care Plan: General Pharmacy (Adult)     Problem Identified: Hypertension, Hyperlipidemia, Diabetes, GERD, Overactive Bladder, and BPH   Priority: High     Long-Range Goal: Patient-Specific Goal   Start Date: 01/13/2021  Expected End Date: 07/17/2022  This Visit's Progress: On track  Recent Progress: On track  Priority: High  Note:   Current Barriers:  No Barriers noted  Pharmacist Clinical Goal(s):  Patient will maintain control of blood pressure as evidenced by BP less than 140/90  through collaboration with PharmD and provider.   Interventions: 1:1 collaboration with Nche, Charlene Brooke, NP regarding development and update of comprehensive plan of care as evidenced by provider attestation and co-signature Inter-disciplinary care team collaboration (see longitudinal plan of care) Comprehensive medication review performed; medication list updated in electronic  medical record  Hypertension (BP goal <140/90) -Controlled -Current treatment: Olmesartan-Amlodipine-HCTZ 40-10-12.5 mg daily: Appropriate, Effective, Safe, Accessible -Medications previously tried: NA  -Current home readings: 123/44  -Current dietary habits: Limits salt intake. -Current exercise habits: PT multiple times weekly -Denies hypotensive/hypertensive symptoms -Counseled to monitor BP at home weekly, document, and provide log at future appointments -Recommended to continue current medication  Hyperlipidemia: (LDL goal < 100) -Controlled -Current treatment: Atorvastatin 20 mg three times weekly -Medications previously tried: NA  -Educated on Benefits of statin for ASCVD risk reduction; -Recommended to continue current medication  Diabetes (A1c goal <7%) -Controlled -Current medications: Metformin XR 750 mg daily: Appropriate, Effective, Safe, Accessible  -Medications previously tried: NA  -Current home glucose readings fasting glucose: 113 -Denies hypoglycemic/hyperglycemic symptoms -Educated on A1c and blood sugar goals; -Counseled to check feet daily and get yearly eye exams -Recommended to continue current medication  Patient Goals/Self-Care Activities Patient will:  - check glucose weekly before breakfast, document, and provide at future appointments check blood pressure weekly, document, and provide at future appointments  Follow Up Plan: Telephone follow up appointment with care management team member scheduled for:  02/05/2022 at 11:00 AM    Patient agreed to services and verbal consent obtained.  Patient verbalizes understanding of instructions and care plan provided today and agrees to view in [place]. Active MyChart status confirmed with patient.    Junius Argyle, PharmD, [place], CPP Clinical Pharmacist Practitioner  [facility] at [facility]  [phone]

## 2021-07-25 ENCOUNTER — Telehealth: Payer: Self-pay | Admitting: Nurse Practitioner

## 2021-07-25 DIAGNOSIS — Z1211 Encounter for screening for malignant neoplasm of colon: Secondary | ICD-10-CM | POA: Diagnosis not present

## 2021-07-25 LAB — COLOGUARD: Cologuard: NEGATIVE

## 2021-07-25 NOTE — Telephone Encounter (Signed)
Marjory Lies from [facility] called and said they could not take on this pt, they do not have the capacity right now.   CB : [phone]

## 2021-07-29 ENCOUNTER — Telehealth: Payer: Self-pay | Admitting: Nurse Practitioner

## 2021-07-29 DIAGNOSIS — I10 Essential (primary) hypertension: Secondary | ICD-10-CM

## 2021-07-29 DIAGNOSIS — E1142 Type 2 diabetes mellitus with diabetic polyneuropathy: Secondary | ICD-10-CM | POA: Diagnosis not present

## 2021-07-29 NOTE — Telephone Encounter (Signed)
° °  Reason for Referral Request: the referral that was placed on 07/14/21 to [facility]. The pt says he spoke with [facility] and they told him to get this referral sent somewhere else because they are under staffed.   Has patient been seen PCP for this complaint? yes

## 2021-07-31 ENCOUNTER — Telehealth: Payer: Self-pay | Admitting: Nurse Practitioner

## 2021-07-31 DIAGNOSIS — E1142 Type 2 diabetes mellitus with diabetic polyneuropathy: Secondary | ICD-10-CM | POA: Diagnosis not present

## 2021-07-31 DIAGNOSIS — E7800 Pure hypercholesterolemia, unspecified: Secondary | ICD-10-CM | POA: Diagnosis not present

## 2021-07-31 DIAGNOSIS — E1159 Type 2 diabetes mellitus with other circulatory complications: Secondary | ICD-10-CM | POA: Diagnosis not present

## 2021-07-31 DIAGNOSIS — N3281 Overactive bladder: Secondary | ICD-10-CM | POA: Diagnosis not present

## 2021-07-31 DIAGNOSIS — G8220 Paraplegia, unspecified: Secondary | ICD-10-CM | POA: Diagnosis not present

## 2021-07-31 NOTE — Telephone Encounter (Signed)
Larry [name] from [facility] care is needing verbal orders for-Skilled PT 1week/1time,2week/4times and 1week/2times for strengthening  and safe transfers, balance training, home exercise program and home safety.  ?Please call Larry [name] at [phone] ?

## 2021-07-31 NOTE — Telephone Encounter (Signed)
Left verbal orders on PT personal voicemail. Informed her to call back with any questions/concerns.  ?

## 2021-08-01 ENCOUNTER — Telehealth: Payer: Self-pay | Admitting: Nurse Practitioner

## 2021-08-01 ENCOUNTER — Encounter: Payer: Self-pay | Admitting: Nurse Practitioner

## 2021-08-01 DIAGNOSIS — E1159 Type 2 diabetes mellitus with other circulatory complications: Secondary | ICD-10-CM | POA: Diagnosis not present

## 2021-08-01 DIAGNOSIS — E7800 Pure hypercholesterolemia, unspecified: Secondary | ICD-10-CM | POA: Diagnosis not present

## 2021-08-01 DIAGNOSIS — G8220 Paraplegia, unspecified: Secondary | ICD-10-CM | POA: Diagnosis not present

## 2021-08-01 DIAGNOSIS — N3281 Overactive bladder: Secondary | ICD-10-CM | POA: Diagnosis not present

## 2021-08-01 DIAGNOSIS — E1142 Type 2 diabetes mellitus with diabetic polyneuropathy: Secondary | ICD-10-CM | POA: Diagnosis not present

## 2021-08-01 LAB — COLOGUARD: COLOGUARD: NEGATIVE

## 2021-08-01 NOTE — Telephone Encounter (Signed)
HH ORDERS  ? ?Caller Name: Elmyra Ricks ?Home Health Agency Name: [facility] Phone #: [phone] ?Service Requested: Occupational Therapy  ?(examples: OT/PT/Skilled Nursing/Social Work/Speech Therapy/Wound Care) ?Frequency of Visits: 1 week 1,  2 week 4,  and 1 week 1 ?

## 2021-08-01 NOTE — Telephone Encounter (Signed)
Verbal orders given  

## 2021-08-04 NOTE — Progress Notes (Signed)
Negative, repeat in 107yr

## 2021-08-05 DIAGNOSIS — E1159 Type 2 diabetes mellitus with other circulatory complications: Secondary | ICD-10-CM | POA: Diagnosis not present

## 2021-08-05 DIAGNOSIS — N3281 Overactive bladder: Secondary | ICD-10-CM | POA: Diagnosis not present

## 2021-08-05 DIAGNOSIS — E1142 Type 2 diabetes mellitus with diabetic polyneuropathy: Secondary | ICD-10-CM | POA: Diagnosis not present

## 2021-08-05 DIAGNOSIS — G8220 Paraplegia, unspecified: Secondary | ICD-10-CM | POA: Diagnosis not present

## 2021-08-05 DIAGNOSIS — E7800 Pure hypercholesterolemia, unspecified: Secondary | ICD-10-CM | POA: Diagnosis not present

## 2021-08-06 DIAGNOSIS — N3281 Overactive bladder: Secondary | ICD-10-CM | POA: Diagnosis not present

## 2021-08-06 DIAGNOSIS — G8220 Paraplegia, unspecified: Secondary | ICD-10-CM | POA: Diagnosis not present

## 2021-08-06 DIAGNOSIS — E1142 Type 2 diabetes mellitus with diabetic polyneuropathy: Secondary | ICD-10-CM | POA: Diagnosis not present

## 2021-08-06 DIAGNOSIS — E1159 Type 2 diabetes mellitus with other circulatory complications: Secondary | ICD-10-CM | POA: Diagnosis not present

## 2021-08-06 DIAGNOSIS — E7800 Pure hypercholesterolemia, unspecified: Secondary | ICD-10-CM | POA: Diagnosis not present

## 2021-08-07 DIAGNOSIS — G8220 Paraplegia, unspecified: Secondary | ICD-10-CM | POA: Diagnosis not present

## 2021-08-07 DIAGNOSIS — E1142 Type 2 diabetes mellitus with diabetic polyneuropathy: Secondary | ICD-10-CM | POA: Diagnosis not present

## 2021-08-07 DIAGNOSIS — E1159 Type 2 diabetes mellitus with other circulatory complications: Secondary | ICD-10-CM | POA: Diagnosis not present

## 2021-08-07 DIAGNOSIS — E7800 Pure hypercholesterolemia, unspecified: Secondary | ICD-10-CM | POA: Diagnosis not present

## 2021-08-07 DIAGNOSIS — N3281 Overactive bladder: Secondary | ICD-10-CM | POA: Diagnosis not present

## 2021-08-12 DIAGNOSIS — E7800 Pure hypercholesterolemia, unspecified: Secondary | ICD-10-CM | POA: Diagnosis not present

## 2021-08-12 DIAGNOSIS — E1142 Type 2 diabetes mellitus with diabetic polyneuropathy: Secondary | ICD-10-CM | POA: Diagnosis not present

## 2021-08-12 DIAGNOSIS — G8220 Paraplegia, unspecified: Secondary | ICD-10-CM | POA: Diagnosis not present

## 2021-08-12 DIAGNOSIS — N3281 Overactive bladder: Secondary | ICD-10-CM | POA: Diagnosis not present

## 2021-08-12 DIAGNOSIS — E1159 Type 2 diabetes mellitus with other circulatory complications: Secondary | ICD-10-CM | POA: Diagnosis not present

## 2021-08-15 DIAGNOSIS — E1142 Type 2 diabetes mellitus with diabetic polyneuropathy: Secondary | ICD-10-CM | POA: Diagnosis not present

## 2021-08-15 DIAGNOSIS — E1159 Type 2 diabetes mellitus with other circulatory complications: Secondary | ICD-10-CM | POA: Diagnosis not present

## 2021-08-15 DIAGNOSIS — E7800 Pure hypercholesterolemia, unspecified: Secondary | ICD-10-CM | POA: Diagnosis not present

## 2021-08-15 DIAGNOSIS — G8220 Paraplegia, unspecified: Secondary | ICD-10-CM | POA: Diagnosis not present

## 2021-08-15 DIAGNOSIS — N3281 Overactive bladder: Secondary | ICD-10-CM | POA: Diagnosis not present

## 2021-08-16 DIAGNOSIS — G8220 Paraplegia, unspecified: Secondary | ICD-10-CM | POA: Diagnosis not present

## 2021-08-16 DIAGNOSIS — E7800 Pure hypercholesterolemia, unspecified: Secondary | ICD-10-CM | POA: Diagnosis not present

## 2021-08-16 DIAGNOSIS — E1159 Type 2 diabetes mellitus with other circulatory complications: Secondary | ICD-10-CM | POA: Diagnosis not present

## 2021-08-16 DIAGNOSIS — N3281 Overactive bladder: Secondary | ICD-10-CM | POA: Diagnosis not present

## 2021-08-16 DIAGNOSIS — E1142 Type 2 diabetes mellitus with diabetic polyneuropathy: Secondary | ICD-10-CM | POA: Diagnosis not present

## 2021-08-18 DIAGNOSIS — E7800 Pure hypercholesterolemia, unspecified: Secondary | ICD-10-CM | POA: Diagnosis not present

## 2021-08-18 DIAGNOSIS — E1142 Type 2 diabetes mellitus with diabetic polyneuropathy: Secondary | ICD-10-CM | POA: Diagnosis not present

## 2021-08-18 DIAGNOSIS — E1159 Type 2 diabetes mellitus with other circulatory complications: Secondary | ICD-10-CM | POA: Diagnosis not present

## 2021-08-18 DIAGNOSIS — G8220 Paraplegia, unspecified: Secondary | ICD-10-CM | POA: Diagnosis not present

## 2021-08-18 DIAGNOSIS — N3281 Overactive bladder: Secondary | ICD-10-CM | POA: Diagnosis not present

## 2021-08-19 DIAGNOSIS — E1142 Type 2 diabetes mellitus with diabetic polyneuropathy: Secondary | ICD-10-CM | POA: Diagnosis not present

## 2021-08-19 DIAGNOSIS — E7800 Pure hypercholesterolemia, unspecified: Secondary | ICD-10-CM | POA: Diagnosis not present

## 2021-08-19 DIAGNOSIS — N3281 Overactive bladder: Secondary | ICD-10-CM | POA: Diagnosis not present

## 2021-08-19 DIAGNOSIS — E1159 Type 2 diabetes mellitus with other circulatory complications: Secondary | ICD-10-CM | POA: Diagnosis not present

## 2021-08-19 DIAGNOSIS — G8220 Paraplegia, unspecified: Secondary | ICD-10-CM | POA: Diagnosis not present

## 2021-08-20 DIAGNOSIS — N3281 Overactive bladder: Secondary | ICD-10-CM | POA: Diagnosis not present

## 2021-08-20 DIAGNOSIS — E7800 Pure hypercholesterolemia, unspecified: Secondary | ICD-10-CM | POA: Diagnosis not present

## 2021-08-20 DIAGNOSIS — E1142 Type 2 diabetes mellitus with diabetic polyneuropathy: Secondary | ICD-10-CM | POA: Diagnosis not present

## 2021-08-20 DIAGNOSIS — G8220 Paraplegia, unspecified: Secondary | ICD-10-CM | POA: Diagnosis not present

## 2021-08-20 DIAGNOSIS — E1159 Type 2 diabetes mellitus with other circulatory complications: Secondary | ICD-10-CM | POA: Diagnosis not present

## 2021-08-21 DIAGNOSIS — N3281 Overactive bladder: Secondary | ICD-10-CM | POA: Diagnosis not present

## 2021-08-21 DIAGNOSIS — E1159 Type 2 diabetes mellitus with other circulatory complications: Secondary | ICD-10-CM | POA: Diagnosis not present

## 2021-08-21 DIAGNOSIS — E7800 Pure hypercholesterolemia, unspecified: Secondary | ICD-10-CM | POA: Diagnosis not present

## 2021-08-21 DIAGNOSIS — E1142 Type 2 diabetes mellitus with diabetic polyneuropathy: Secondary | ICD-10-CM | POA: Diagnosis not present

## 2021-08-21 DIAGNOSIS — G8220 Paraplegia, unspecified: Secondary | ICD-10-CM | POA: Diagnosis not present

## 2021-08-25 DIAGNOSIS — E1159 Type 2 diabetes mellitus with other circulatory complications: Secondary | ICD-10-CM | POA: Diagnosis not present

## 2021-08-25 DIAGNOSIS — G8220 Paraplegia, unspecified: Secondary | ICD-10-CM | POA: Diagnosis not present

## 2021-08-25 DIAGNOSIS — E7800 Pure hypercholesterolemia, unspecified: Secondary | ICD-10-CM | POA: Diagnosis not present

## 2021-08-25 DIAGNOSIS — E1142 Type 2 diabetes mellitus with diabetic polyneuropathy: Secondary | ICD-10-CM | POA: Diagnosis not present

## 2021-08-25 DIAGNOSIS — N3281 Overactive bladder: Secondary | ICD-10-CM | POA: Diagnosis not present

## 2021-08-28 DIAGNOSIS — E1159 Type 2 diabetes mellitus with other circulatory complications: Secondary | ICD-10-CM | POA: Diagnosis not present

## 2021-08-28 DIAGNOSIS — E1142 Type 2 diabetes mellitus with diabetic polyneuropathy: Secondary | ICD-10-CM | POA: Diagnosis not present

## 2021-08-28 DIAGNOSIS — E7800 Pure hypercholesterolemia, unspecified: Secondary | ICD-10-CM | POA: Diagnosis not present

## 2021-08-28 DIAGNOSIS — N3281 Overactive bladder: Secondary | ICD-10-CM | POA: Diagnosis not present

## 2021-08-28 DIAGNOSIS — G8220 Paraplegia, unspecified: Secondary | ICD-10-CM | POA: Diagnosis not present

## 2021-08-29 DIAGNOSIS — E7800 Pure hypercholesterolemia, unspecified: Secondary | ICD-10-CM | POA: Diagnosis not present

## 2021-08-29 DIAGNOSIS — N3281 Overactive bladder: Secondary | ICD-10-CM | POA: Diagnosis not present

## 2021-08-29 DIAGNOSIS — E1142 Type 2 diabetes mellitus with diabetic polyneuropathy: Secondary | ICD-10-CM | POA: Diagnosis not present

## 2021-08-29 DIAGNOSIS — G8220 Paraplegia, unspecified: Secondary | ICD-10-CM | POA: Diagnosis not present

## 2021-08-29 DIAGNOSIS — E1159 Type 2 diabetes mellitus with other circulatory complications: Secondary | ICD-10-CM | POA: Diagnosis not present

## 2021-09-01 DIAGNOSIS — N3281 Overactive bladder: Secondary | ICD-10-CM | POA: Diagnosis not present

## 2021-09-01 DIAGNOSIS — E7800 Pure hypercholesterolemia, unspecified: Secondary | ICD-10-CM | POA: Diagnosis not present

## 2021-09-01 DIAGNOSIS — E1159 Type 2 diabetes mellitus with other circulatory complications: Secondary | ICD-10-CM | POA: Diagnosis not present

## 2021-09-01 DIAGNOSIS — G8220 Paraplegia, unspecified: Secondary | ICD-10-CM | POA: Diagnosis not present

## 2021-09-01 DIAGNOSIS — E1142 Type 2 diabetes mellitus with diabetic polyneuropathy: Secondary | ICD-10-CM | POA: Diagnosis not present

## 2021-09-02 DIAGNOSIS — E7800 Pure hypercholesterolemia, unspecified: Secondary | ICD-10-CM | POA: Diagnosis not present

## 2021-09-02 DIAGNOSIS — N3281 Overactive bladder: Secondary | ICD-10-CM | POA: Diagnosis not present

## 2021-09-02 DIAGNOSIS — E1142 Type 2 diabetes mellitus with diabetic polyneuropathy: Secondary | ICD-10-CM | POA: Diagnosis not present

## 2021-09-02 DIAGNOSIS — E1159 Type 2 diabetes mellitus with other circulatory complications: Secondary | ICD-10-CM | POA: Diagnosis not present

## 2021-09-02 DIAGNOSIS — G8220 Paraplegia, unspecified: Secondary | ICD-10-CM | POA: Diagnosis not present

## 2021-09-03 DIAGNOSIS — G8220 Paraplegia, unspecified: Secondary | ICD-10-CM | POA: Diagnosis not present

## 2021-09-03 DIAGNOSIS — E1142 Type 2 diabetes mellitus with diabetic polyneuropathy: Secondary | ICD-10-CM | POA: Diagnosis not present

## 2021-09-03 DIAGNOSIS — E7800 Pure hypercholesterolemia, unspecified: Secondary | ICD-10-CM | POA: Diagnosis not present

## 2021-09-03 DIAGNOSIS — N3281 Overactive bladder: Secondary | ICD-10-CM | POA: Diagnosis not present

## 2021-09-03 DIAGNOSIS — E1159 Type 2 diabetes mellitus with other circulatory complications: Secondary | ICD-10-CM | POA: Diagnosis not present

## 2021-09-07 ENCOUNTER — Other Ambulatory Visit: Payer: Self-pay | Admitting: Nurse Practitioner

## 2021-09-07 DIAGNOSIS — E7800 Pure hypercholesterolemia, unspecified: Secondary | ICD-10-CM

## 2021-09-10 DIAGNOSIS — N3281 Overactive bladder: Secondary | ICD-10-CM | POA: Diagnosis not present

## 2021-09-10 DIAGNOSIS — E1142 Type 2 diabetes mellitus with diabetic polyneuropathy: Secondary | ICD-10-CM | POA: Diagnosis not present

## 2021-09-10 DIAGNOSIS — E1159 Type 2 diabetes mellitus with other circulatory complications: Secondary | ICD-10-CM | POA: Diagnosis not present

## 2021-09-10 DIAGNOSIS — G8220 Paraplegia, unspecified: Secondary | ICD-10-CM | POA: Diagnosis not present

## 2021-09-10 DIAGNOSIS — E7800 Pure hypercholesterolemia, unspecified: Secondary | ICD-10-CM | POA: Diagnosis not present

## 2021-09-10 NOTE — Telephone Encounter (Signed)
Chart supports Rx ?Last seen 07/2021 ?Next OV 09/2021 ?

## 2021-10-10 ENCOUNTER — Other Ambulatory Visit: Payer: Self-pay | Admitting: Nurse Practitioner

## 2021-10-10 DIAGNOSIS — K219 Gastro-esophageal reflux disease without esophagitis: Secondary | ICD-10-CM

## 2021-10-14 NOTE — Telephone Encounter (Signed)
Chart supports Rx ?Last seen 07/2021 ?Next OV 12/2021 ?

## 2021-11-16 ENCOUNTER — Other Ambulatory Visit: Payer: Self-pay | Admitting: Nurse Practitioner

## 2021-11-16 DIAGNOSIS — E118 Type 2 diabetes mellitus with unspecified complications: Secondary | ICD-10-CM

## 2021-11-18 NOTE — Telephone Encounter (Signed)
Chart Supports Rx Last OV: 07/2021

## 2021-12-22 ENCOUNTER — Telehealth: Payer: Self-pay | Admitting: Nurse Practitioner

## 2021-12-22 DIAGNOSIS — G8220 Paraplegia, unspecified: Secondary | ICD-10-CM

## 2021-12-22 DIAGNOSIS — M4802 Spinal stenosis, cervical region: Secondary | ICD-10-CM

## 2021-12-22 NOTE — Telephone Encounter (Signed)
Caller Name: Brysyn Brandenberger Call back phone #: [phone]  Reason for Call: Pt stated that he broke his wheelchair, [employer] [phone]) needs a prescription in order for him to get a replacement. He said his current one is a drive wheelchair with dimensions [phone] Pt would also like a call back to speak about possibilities of  at home physical therapy.

## 2021-12-23 NOTE — Telephone Encounter (Signed)
Rx for wheelchair has been faxed to [employer]

## 2021-12-23 NOTE — Telephone Encounter (Signed)
Send to [employer].

## 2021-12-29 ENCOUNTER — Telehealth: Payer: Self-pay | Admitting: Nurse Practitioner

## 2021-12-29 NOTE — Telephone Encounter (Signed)
Caller Name: pt Call back phone #: [phone]   Reason for Call: Pt is enquiring about the wheelchair and his [facility] PT.

## 2021-12-29 NOTE — Telephone Encounter (Signed)
Called & spoke w/ pt, adv the Rx for his wheelchair was faxed over to [employer] twice on 12/24/2021. He is to call them to verify. I will fax it again if they have not received it both times.

## 2022-01-12 ENCOUNTER — Ambulatory Visit (INDEPENDENT_AMBULATORY_CARE_PROVIDER_SITE_OTHER): Payer: Medicare Other | Admitting: Nurse Practitioner

## 2022-01-12 ENCOUNTER — Encounter: Payer: Self-pay | Admitting: Nurse Practitioner

## 2022-01-12 VITALS — BP 126/76 | HR 77 | Temp 98.0°F | Ht 67.0 in | Wt 164.6 lb

## 2022-01-12 DIAGNOSIS — G8220 Paraplegia, unspecified: Secondary | ICD-10-CM

## 2022-01-12 DIAGNOSIS — N401 Enlarged prostate with lower urinary tract symptoms: Secondary | ICD-10-CM

## 2022-01-12 DIAGNOSIS — R600 Localized edema: Secondary | ICD-10-CM | POA: Diagnosis not present

## 2022-01-12 DIAGNOSIS — D509 Iron deficiency anemia, unspecified: Secondary | ICD-10-CM

## 2022-01-12 DIAGNOSIS — M48062 Spinal stenosis, lumbar region with neurogenic claudication: Secondary | ICD-10-CM

## 2022-01-12 DIAGNOSIS — E1169 Type 2 diabetes mellitus with other specified complication: Secondary | ICD-10-CM

## 2022-01-12 DIAGNOSIS — E1142 Type 2 diabetes mellitus with diabetic polyneuropathy: Secondary | ICD-10-CM | POA: Diagnosis not present

## 2022-01-12 DIAGNOSIS — I10 Essential (primary) hypertension: Secondary | ICD-10-CM

## 2022-01-12 DIAGNOSIS — E1151 Type 2 diabetes mellitus with diabetic peripheral angiopathy without gangrene: Secondary | ICD-10-CM | POA: Diagnosis not present

## 2022-01-12 DIAGNOSIS — E785 Hyperlipidemia, unspecified: Secondary | ICD-10-CM

## 2022-01-12 NOTE — Assessment & Plan Note (Addendum)
Chronic due to paraplegia and high sodium diet Advised about the importance of low sodium diet and how to read food/spice labels. Continue use of compression stocking

## 2022-01-12 NOTE — Assessment & Plan Note (Signed)
Persistent nocturia with mybertriq due to night time fluid consumption. Advised to avoid fluid intake with 2hrs of bedtime, avoid caffeine drinks after 12noon. Repeat PSA Maintain myrbertriq dose

## 2022-01-12 NOTE — Patient Instructions (Addendum)
Schedule appt with diabetic eye exam. Go to lab.

## 2022-01-12 NOTE — Assessment & Plan Note (Signed)
Repeat cologuard 2023: negative He denies any blood in stool Repeat cbc and iron panel

## 2022-01-12 NOTE — Assessment & Plan Note (Signed)
LDL at goal with atorvastatin

## 2022-01-12 NOTE — Assessment & Plan Note (Addendum)
BP at goal with olmesartan, amlodipine and hctz BP Readings from Last 3 Encounters:  01/12/22 126/76  07/14/21 124/60  01/09/21 130/64   Maintain med doses Repeat CMP

## 2022-01-12 NOTE — Assessment & Plan Note (Signed)
Reports LE muscle weakness 35month after completion of previous Home OT and PT sessions (07/2021 to 08/2021). Reports difficult with transfer from wheelchair to bed/couch/commode/ while to prepare meals. He denies any falls, no pressure ulcers. He wants to resume home PT and OT. He also needs new manual wheelchair. Current wheelchair's hand brakes do not work and arm rest is unstable.  Advised how to prevent pressure ulcers. Entered order for home PT/OT and manual wheelchair.

## 2022-01-12 NOTE — Assessment & Plan Note (Signed)
Advised to schedule appt for eye exam No adverse effects with metformin Stable LE neuropathy with use of lyrica BP at goal. LDL at goal with atorvastatin  Repeat hgbA1c, CMP, urine microalbumin, and lipid panel Maintain current med doses

## 2022-01-12 NOTE — Progress Notes (Signed)
Established Patient Visit  Patient: Larry [name]   DOB: January 12, 1951   71 y.o. Male  MRN: [id number] Visit Date: 01/12/2022  Subjective:    Chief Complaint  Patient presents with   Office Visit    HTN/ Hyperlipidemia/ DM Doesn't check BP or BS often  Would like physical therapy No other concerns   Accompanied by daughter.  HPI Hypertension BP at goal with olmesartan, amlodipine and hctz BP Readings from Last 3 Encounters:  01/12/22 126/76  07/14/21 124/60  01/09/21 130/64   Maintain med doses Repeat CMP  Bilateral leg edema Chronic due to paraplegia and high sodium diet Advised about the importance of low sodium diet and how to read food/spice labels. Continue use of compression stocking  Paraplegia (HCC) Reports LE muscle weakness 35month after completion of previous Home OT and PT sessions (07/2021 to 08/2021). Reports difficult with transfer from wheelchair to bed/couch/commode/ while to prepare meals. He denies any falls, no pressure ulcers. He wants to resume home PT and OT. He also needs new manual wheelchair. Current wheelchair's hand brakes do not work and arm rest is unstable.  Advised how to prevent pressure ulcers. Entered order for home PT/OT and manual wheelchair.  DM (diabetes mellitus) ([place] Advised to schedule appt for eye exam No adverse effects with metformin Stable LE neuropathy with use of lyrica BP at goal. LDL at goal with atorvastatin  Repeat hgbA1c, CMP, urine microalbumin, and lipid panel Maintain current med doses  Hyperlipidemia associated with type 2 diabetes mellitus ([facility] [place] LDL at goal with atorvastatin  Benign prostatic hyperplasia with lower urinary tract symptoms Persistent nocturia with mybertriq due to night time fluid consumption. Advised to avoid fluid intake with 2hrs of bedtime, avoid caffeine drinks after 12noon. Repeat PSA Maintain myrbertriq dose  Iron deficiency anemia Repeat cologuard 2023:  negative He denies any blood in stool Repeat cbc and iron panel  Wt Readings from Last 3 Encounters:  01/12/22 164 lb 9.6 oz (74.7 kg)  07/14/21 169 lb (76.7 kg)  01/09/21 169 lb 6.4 oz (76.8 kg)    Reviewed medical, surgical, and social history today  Medications: Outpatient Medications Prior to Visit  Medication Sig   ACCU-CHEK AVIVA PLUS test strip TEST FASTING BLOOD SUGAR  ONCE DAILY IN THE MORNING   Accu-Chek FastClix Lancets MISC    aspirin 81 MG tablet Take 81 mg by mouth daily.   atorvastatin (LIPITOR) 20 MG tablet TAKE 1 TABLET BY MOUTH DAILY   baclofen (LIORESAL) 20 MG tablet Take 1 tablet (20 mg total) by mouth 3 (three) times daily.   Blood Glucose Calibration (ACCU-CHEK AVIVA) SOLN    finasteride (PROSCAR) 5 MG tablet Take 1 tablet (5 mg total) by mouth daily.   Incontinence Supply Disposable (DEPEND ADJUSTABLE UNDERWEAR LG) MISC Use as needed.   Lancets Misc. (ACCU-CHEK FASTCLIX LANCET) KIT 1 Units by Does not apply route daily before breakfast.   meloxicam (MOBIC) 15 MG tablet Take 1 tablet (15 mg total) by mouth daily as needed. for pain   metFORMIN (GLUCOPHAGE-XR) 750 MG 24 hr tablet Take 1 tablet (750 mg total) by mouth daily with breakfast.   mirabegron ER (MYRBETRIQ) 25 MG TB24 tablet Take 1 tablet (25 mg total) by mouth daily.   Multiple Vitamin (MULTIVITAMIN) tablet Take 1 tablet by mouth daily.   Olmesartan-amLODIPine-HCTZ 40-10-12.5 MG TABS Take 1 tablet by mouth daily.   omeprazole (PRILOSEC) 20 MG capsule TAKE  1 CAPSULE BY MOUTH DAILY   pregabalin (LYRICA) 100 MG capsule Take 1 capsule (100 mg total) by mouth 2 (two) times daily.   traZODone (DESYREL) 50 MG tablet Take 1 tablet (50 mg total) by mouth at bedtime.   ferrous sulfate 325 (65 FE) MG tablet Take 1 tablet (325 mg total) by mouth daily with breakfast. (Patient not taking: Reported on 01/12/2022)   No facility-administered medications prior to visit.   Reviewed past medical and social history.    ROS per HPI above      Objective:  BP 126/76 (BP Location: Right Arm, Patient Position: Sitting, Cuff Size: Normal)   Pulse 77   Temp 98 F (36.7 C) (Temporal)   Ht '5\' 7"'  (1.702 m)   Wt 164 lb 9.6 oz (74.7 kg)   SpO2 97%   BMI 25.78 kg/m      Physical Exam  No results found for any visits on 01/12/22.    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 LDL at goal with atorvastatin  Repeat hgbA1c, CMP, urine microalbumin, and lipid panel Maintain current med doses      Relevant Orders   Hemoglobin A1c   Comprehensive metabolic panel   Microalbumin / creatinine urine ratio   Hyperlipidemia associated with type 2 diabetes mellitus (HCC)    LDL at goal with atorvastatin      Relevant Orders   Comprehensive metabolic panel   Lipid panel     Nervous and Auditory   Paraplegia (HCC)    Reports LE muscle weakness 6month after completion of previous Home OT and PT sessions (07/2021 to 08/2021). Reports difficult with transfer from wheelchair to bed/couch/commode/ while to prepare meals. He denies any falls, no pressure ulcers. He wants to resume home PT and OT. He also needs new manual wheelchair. Current wheelchair's hand brakes do not work and arm rest is unstable.  Advised how to prevent pressure ulcers. Entered order for home PT/OT and manual wheelchair.      Relevant Orders   DME Wheelchair manual   Ambulatory  referral to [facility]    Genitourinary   Benign prostatic hyperplasia with lower urinary tract symptoms    Persistent nocturia with mybertriq due to night time fluid consumption. Advised to avoid fluid intake with 2hrs of bedtime, avoid caffeine drinks after 12noon. Repeat PSA Maintain myrbertriq dose      Relevant Orders   PSA, Medicare     Other   Bilateral leg edema    Chronic due to paraplegia and high sodium diet Advised about the importance of low sodium diet and how to read food/spice labels. Continue use of compression stocking      Iron deficiency anemia    Repeat cologuard 2023: negative He denies any blood in stool Repeat cbc and iron panel      Relevant Orders  CBC   IBC + Ferritin   Ambulatory referral to Home Health   Spinal stenosis, lumbar region, with neurogenic claudication   Relevant Orders   DME Wheelchair manual   Ambulatory referral to [place]   Return in about 6 months (around 07/15/2022) for DM, hyperlipidemia (fasting), HTN.     Wilfred Lacy, NP

## 2022-01-13 LAB — CBC
HCT: 38.3 % — ABNORMAL LOW (ref 39.0–52.0)
Hemoglobin: 13.1 g/dL (ref 13.0–17.0)
MCHC: 34.1 g/dL (ref 30.0–36.0)
MCV: 93.9 fl (ref 78.0–100.0)
Platelets: 273 10*3/uL (ref 150.0–400.0)
RBC: 4.08 Mil/uL — ABNORMAL LOW (ref 4.22–5.81)
RDW: 14.6 % (ref 11.5–15.5)
WBC: 4 10*3/uL (ref 4.0–10.5)

## 2022-01-13 LAB — IBC + FERRITIN
Ferritin: 34.9 ng/mL (ref 22.0–322.0)
Iron: 94 ug/dL (ref 42–165)
Saturation Ratios: 25.1 % (ref 20.0–50.0)
TIBC: 373.8 ug/dL (ref 250.0–450.0)
Transferrin: 267 mg/dL (ref 212.0–360.0)

## 2022-01-13 LAB — COMPREHENSIVE METABOLIC PANEL
ALT: 14 U/L (ref 0–53)
AST: 24 U/L (ref 0–37)
Albumin: 4.7 g/dL (ref 3.5–5.2)
Alkaline Phosphatase: 79 U/L (ref 39–117)
BUN: 21 mg/dL (ref 6–23)
CO2: 28 mEq/L (ref 19–32)
Calcium: 9.9 mg/dL (ref 8.4–10.5)
Chloride: 102 mEq/L (ref 96–112)
Creatinine, Ser: 1.01 mg/dL (ref 0.40–1.50)
GFR: 73.82 mL/min (ref >60.00)
Glucose, Bld: 102 mg/dL — ABNORMAL HIGH (ref 70–99)
Potassium: 3.7 mEq/L (ref 3.5–5.1)
Sodium: 140 mEq/L (ref 135–145)
Total Bilirubin: 0.4 mg/dL (ref 0.2–1.2)
Total Protein: 6.9 g/dL (ref 6.0–8.3)

## 2022-01-13 LAB — LIPID PANEL
Cholesterol: 116 mg/dL (ref 0–200)
HDL: 34.9 mg/dL — ABNORMAL LOW (ref >39.00)
LDL Cholesterol: 58 mg/dL (ref 0–99)
NonHDL: 80.81
Total CHOL/HDL Ratio: 3
Triglycerides: 116 mg/dL (ref 0.0–149.0)
VLDL: 23.2 mg/dL (ref 0.0–40.0)

## 2022-01-13 LAB — HEMOGLOBIN A1C: Hgb A1c MFr Bld: 6.6 % — ABNORMAL HIGH (ref 4.6–6.5)

## 2022-01-13 LAB — PSA, MEDICARE: PSA: 0.6 ng/ml (ref 0.10–4.00)

## 2022-01-16 ENCOUNTER — Telehealth: Payer: Self-pay | Admitting: Nurse Practitioner

## 2022-01-16 DIAGNOSIS — E1142 Type 2 diabetes mellitus with diabetic polyneuropathy: Secondary | ICD-10-CM | POA: Diagnosis not present

## 2022-01-16 DIAGNOSIS — M48062 Spinal stenosis, lumbar region with neurogenic claudication: Secondary | ICD-10-CM | POA: Diagnosis not present

## 2022-01-16 DIAGNOSIS — E785 Hyperlipidemia, unspecified: Secondary | ICD-10-CM | POA: Diagnosis not present

## 2022-01-16 DIAGNOSIS — I10 Essential (primary) hypertension: Secondary | ICD-10-CM | POA: Diagnosis not present

## 2022-01-16 DIAGNOSIS — E1169 Type 2 diabetes mellitus with other specified complication: Secondary | ICD-10-CM | POA: Diagnosis not present

## 2022-01-16 NOTE — Telephone Encounter (Signed)
[place] called  [phone] )from [facility] stating the the pt need a wheel chair size 18x16x18  from [employer] and it can be sent to [facility]

## 2022-01-16 NOTE — Telephone Encounter (Signed)
Called & spoke w/ rep, adv w/e have faxed off/ this order to [facility] on 01/12/22.

## 2022-01-20 DIAGNOSIS — I10 Essential (primary) hypertension: Secondary | ICD-10-CM | POA: Diagnosis not present

## 2022-01-20 DIAGNOSIS — M48062 Spinal stenosis, lumbar region with neurogenic claudication: Secondary | ICD-10-CM | POA: Diagnosis not present

## 2022-01-20 DIAGNOSIS — E1169 Type 2 diabetes mellitus with other specified complication: Secondary | ICD-10-CM | POA: Diagnosis not present

## 2022-01-20 DIAGNOSIS — E1142 Type 2 diabetes mellitus with diabetic polyneuropathy: Secondary | ICD-10-CM | POA: Diagnosis not present

## 2022-01-20 DIAGNOSIS — E785 Hyperlipidemia, unspecified: Secondary | ICD-10-CM | POA: Diagnosis not present

## 2022-01-21 DIAGNOSIS — E1169 Type 2 diabetes mellitus with other specified complication: Secondary | ICD-10-CM | POA: Diagnosis not present

## 2022-01-21 DIAGNOSIS — E785 Hyperlipidemia, unspecified: Secondary | ICD-10-CM | POA: Diagnosis not present

## 2022-01-21 DIAGNOSIS — M48062 Spinal stenosis, lumbar region with neurogenic claudication: Secondary | ICD-10-CM | POA: Diagnosis not present

## 2022-01-21 DIAGNOSIS — E1142 Type 2 diabetes mellitus with diabetic polyneuropathy: Secondary | ICD-10-CM | POA: Diagnosis not present

## 2022-01-21 DIAGNOSIS — I10 Essential (primary) hypertension: Secondary | ICD-10-CM | POA: Diagnosis not present

## 2022-01-22 DIAGNOSIS — N401 Enlarged prostate with lower urinary tract symptoms: Secondary | ICD-10-CM

## 2022-01-22 DIAGNOSIS — Z9181 History of falling: Secondary | ICD-10-CM | POA: Diagnosis not present

## 2022-01-22 DIAGNOSIS — I10 Essential (primary) hypertension: Secondary | ICD-10-CM | POA: Diagnosis not present

## 2022-01-22 DIAGNOSIS — E1169 Type 2 diabetes mellitus with other specified complication: Secondary | ICD-10-CM | POA: Diagnosis not present

## 2022-01-22 DIAGNOSIS — G8220 Paraplegia, unspecified: Secondary | ICD-10-CM | POA: Diagnosis not present

## 2022-01-22 DIAGNOSIS — D509 Iron deficiency anemia, unspecified: Secondary | ICD-10-CM | POA: Diagnosis not present

## 2022-01-22 DIAGNOSIS — E785 Hyperlipidemia, unspecified: Secondary | ICD-10-CM | POA: Diagnosis not present

## 2022-01-22 DIAGNOSIS — Z7982 Long term (current) use of aspirin: Secondary | ICD-10-CM | POA: Diagnosis not present

## 2022-01-22 DIAGNOSIS — Z7984 Long term (current) use of oral hypoglycemic drugs: Secondary | ICD-10-CM | POA: Diagnosis not present

## 2022-01-22 DIAGNOSIS — M48062 Spinal stenosis, lumbar region with neurogenic claudication: Secondary | ICD-10-CM | POA: Diagnosis not present

## 2022-01-22 DIAGNOSIS — E1142 Type 2 diabetes mellitus with diabetic polyneuropathy: Secondary | ICD-10-CM | POA: Diagnosis not present

## 2022-01-22 DIAGNOSIS — R351 Nocturia: Secondary | ICD-10-CM

## 2022-01-23 DIAGNOSIS — E1142 Type 2 diabetes mellitus with diabetic polyneuropathy: Secondary | ICD-10-CM | POA: Diagnosis not present

## 2022-01-23 DIAGNOSIS — M48062 Spinal stenosis, lumbar region with neurogenic claudication: Secondary | ICD-10-CM | POA: Diagnosis not present

## 2022-01-23 DIAGNOSIS — I10 Essential (primary) hypertension: Secondary | ICD-10-CM | POA: Diagnosis not present

## 2022-01-23 DIAGNOSIS — E785 Hyperlipidemia, unspecified: Secondary | ICD-10-CM | POA: Diagnosis not present

## 2022-01-23 DIAGNOSIS — E1169 Type 2 diabetes mellitus with other specified complication: Secondary | ICD-10-CM | POA: Diagnosis not present

## 2022-01-23 NOTE — Telephone Encounter (Signed)
Na

## 2022-01-26 ENCOUNTER — Other Ambulatory Visit: Payer: Self-pay | Admitting: Nurse Practitioner

## 2022-01-26 DIAGNOSIS — G629 Polyneuropathy, unspecified: Secondary | ICD-10-CM

## 2022-01-26 NOTE — Telephone Encounter (Signed)
Chart supports Rx Last OV: 12/2021 Next OV: 07/2022  

## 2022-01-27 ENCOUNTER — Other Ambulatory Visit: Payer: Self-pay

## 2022-01-27 DIAGNOSIS — M48062 Spinal stenosis, lumbar region with neurogenic claudication: Secondary | ICD-10-CM | POA: Diagnosis not present

## 2022-01-27 DIAGNOSIS — E1169 Type 2 diabetes mellitus with other specified complication: Secondary | ICD-10-CM | POA: Diagnosis not present

## 2022-01-27 DIAGNOSIS — E1142 Type 2 diabetes mellitus with diabetic polyneuropathy: Secondary | ICD-10-CM | POA: Diagnosis not present

## 2022-01-27 DIAGNOSIS — G629 Polyneuropathy, unspecified: Secondary | ICD-10-CM

## 2022-01-27 DIAGNOSIS — I10 Essential (primary) hypertension: Secondary | ICD-10-CM | POA: Diagnosis not present

## 2022-01-27 DIAGNOSIS — E785 Hyperlipidemia, unspecified: Secondary | ICD-10-CM | POA: Diagnosis not present

## 2022-01-27 MED ORDER — PREGABALIN 100 MG PO CAPS: 100.0000 mg | ORAL_CAPSULE | Freq: Two times a day (BID) | ORAL | 1 refills | Status: DC

## 2022-01-28 ENCOUNTER — Telehealth: Payer: Self-pay | Admitting: Nurse Practitioner

## 2022-01-28 NOTE — Telephone Encounter (Signed)
Caller Name: Alvis Lemmings  Call back phone #: [phone]   Reason for Call: Pt has two outstanding orders that have been sent over several times according to the rep at [facility]. Please sign and fax over to [phone]

## 2022-01-28 NOTE — Telephone Encounter (Signed)
Called & spoke w/ Abigail Butts from [facility]. Adv I had faxed over both forms on 01/22/22 & 01/26/2022. I have faxed them over again for the third time at th fax number provided.

## 2022-02-04 ENCOUNTER — Telehealth: Payer: Self-pay | Admitting: Nurse Practitioner

## 2022-02-04 DIAGNOSIS — M48062 Spinal stenosis, lumbar region with neurogenic claudication: Secondary | ICD-10-CM | POA: Diagnosis not present

## 2022-02-04 DIAGNOSIS — E785 Hyperlipidemia, unspecified: Secondary | ICD-10-CM | POA: Diagnosis not present

## 2022-02-04 DIAGNOSIS — E1142 Type 2 diabetes mellitus with diabetic polyneuropathy: Secondary | ICD-10-CM | POA: Diagnosis not present

## 2022-02-04 DIAGNOSIS — I10 Essential (primary) hypertension: Secondary | ICD-10-CM | POA: Diagnosis not present

## 2022-02-04 DIAGNOSIS — E1169 Type 2 diabetes mellitus with other specified complication: Secondary | ICD-10-CM | POA: Diagnosis not present

## 2022-02-04 NOTE — Telephone Encounter (Signed)
Called and spoke w/ pt. Per Larry [name], His daughter can come by and pick up the hard copy of the RX so they can get the wheelchair. Pt says his daughter won't be back in town until the end of the week, we agreed to send the hard copy through the mail.

## 2022-02-04 NOTE — Telephone Encounter (Signed)
[facility] been trying to reach out to you about pt getting a wheelchair

## 2022-02-05 ENCOUNTER — Telehealth: Payer: Medicare Other

## 2022-02-09 ENCOUNTER — Telehealth: Payer: Self-pay | Admitting: Nurse Practitioner

## 2022-02-09 DIAGNOSIS — K921 Melena: Secondary | ICD-10-CM

## 2022-02-09 DIAGNOSIS — M48062 Spinal stenosis, lumbar region with neurogenic claudication: Secondary | ICD-10-CM

## 2022-02-09 DIAGNOSIS — G8220 Paraplegia, unspecified: Secondary | ICD-10-CM

## 2022-02-09 DIAGNOSIS — Z9181 History of falling: Secondary | ICD-10-CM

## 2022-02-09 NOTE — Telephone Encounter (Signed)
Caller Name: Pt Call back phone #: [phone]  Reason for Call:  Pt fell from the couch trying to get to his wheelchair and feels he needs a personal nurse. He is not hurt but afraid next time may be different. Last week he had blood in his stool but it's gone now. He is requesting for an order to be put in for a wheelchair, his is worn out. He seems to have a lot to talk about so I was wondering if he needs to be a 40 minute slot. I told him 11 on the 25th but that won't work she is at her max. Let me know so I can call him back please.

## 2022-02-09 NOTE — Telephone Encounter (Signed)
Entered order for occult stool test. Needs to go to lab for stool kit. Entered CCM referral: Education officer, museum to help him with community resources. For example long term nurse aide.  A home health nurse does not provide assistance with ADLs.

## 2022-02-09 NOTE — Telephone Encounter (Signed)
Left VM, adv pt to call back  

## 2022-02-10 NOTE — Telephone Encounter (Signed)
Pt advised.

## 2022-02-11 DIAGNOSIS — M48062 Spinal stenosis, lumbar region with neurogenic claudication: Secondary | ICD-10-CM | POA: Diagnosis not present

## 2022-02-11 DIAGNOSIS — E1142 Type 2 diabetes mellitus with diabetic polyneuropathy: Secondary | ICD-10-CM | POA: Diagnosis not present

## 2022-02-11 DIAGNOSIS — I10 Essential (primary) hypertension: Secondary | ICD-10-CM | POA: Diagnosis not present

## 2022-02-11 DIAGNOSIS — E785 Hyperlipidemia, unspecified: Secondary | ICD-10-CM | POA: Diagnosis not present

## 2022-02-11 DIAGNOSIS — E1169 Type 2 diabetes mellitus with other specified complication: Secondary | ICD-10-CM | POA: Diagnosis not present

## 2022-02-15 ENCOUNTER — Emergency Department (HOSPITAL_COMMUNITY): Payer: Medicare Other

## 2022-02-15 ENCOUNTER — Encounter (HOSPITAL_COMMUNITY): Payer: Self-pay

## 2022-02-15 ENCOUNTER — Other Ambulatory Visit: Payer: Self-pay

## 2022-02-15 ENCOUNTER — Inpatient Hospital Stay (HOSPITAL_COMMUNITY)
Admission: EM | Admit: 2022-02-15 | Discharge: 2022-02-18 | DRG: 377 | Disposition: A | Discharge status: Skilled Nursing Facility | Payer: Medicare Other | Attending: Family Medicine | Admitting: Family Medicine

## 2022-02-15 DIAGNOSIS — R9431 Abnormal electrocardiogram [ECG] [EKG]: Secondary | ICD-10-CM | POA: Diagnosis not present

## 2022-02-15 DIAGNOSIS — Z20822 Contact with and (suspected) exposure to covid-19: Secondary | ICD-10-CM | POA: Diagnosis not present

## 2022-02-15 DIAGNOSIS — G35 Multiple sclerosis: Secondary | ICD-10-CM | POA: Diagnosis not present

## 2022-02-15 DIAGNOSIS — Z7401 Bed confinement status: Secondary | ICD-10-CM | POA: Diagnosis not present

## 2022-02-15 DIAGNOSIS — Z993 Dependence on wheelchair: Secondary | ICD-10-CM | POA: Diagnosis not present

## 2022-02-15 DIAGNOSIS — L8990 Pressure ulcer of unspecified site, unspecified stage: Secondary | ICD-10-CM | POA: Insufficient documentation

## 2022-02-15 DIAGNOSIS — M6281 Muscle weakness (generalized): Secondary | ICD-10-CM | POA: Diagnosis not present

## 2022-02-15 DIAGNOSIS — K319 Disease of stomach and duodenum, unspecified: Secondary | ICD-10-CM | POA: Diagnosis not present

## 2022-02-15 DIAGNOSIS — X58XXXS Exposure to other specified factors, sequela: Secondary | ICD-10-CM | POA: Diagnosis present

## 2022-02-15 DIAGNOSIS — D62 Acute posthemorrhagic anemia: Secondary | ICD-10-CM | POA: Diagnosis not present

## 2022-02-15 DIAGNOSIS — L89152 Pressure ulcer of sacral region, stage 2: Secondary | ICD-10-CM | POA: Diagnosis present

## 2022-02-15 DIAGNOSIS — N179 Acute kidney failure, unspecified: Secondary | ICD-10-CM | POA: Diagnosis not present

## 2022-02-15 DIAGNOSIS — E1151 Type 2 diabetes mellitus with diabetic peripheral angiopathy without gangrene: Secondary | ICD-10-CM | POA: Diagnosis present

## 2022-02-15 DIAGNOSIS — E7800 Pure hypercholesterolemia, unspecified: Secondary | ICD-10-CM | POA: Diagnosis not present

## 2022-02-15 DIAGNOSIS — K274 Chronic or unspecified peptic ulcer, site unspecified, with hemorrhage: Secondary | ICD-10-CM | POA: Diagnosis not present

## 2022-02-15 DIAGNOSIS — M4802 Spinal stenosis, cervical region: Secondary | ICD-10-CM

## 2022-02-15 DIAGNOSIS — G9341 Metabolic encephalopathy: Secondary | ICD-10-CM | POA: Diagnosis present

## 2022-02-15 DIAGNOSIS — E119 Type 2 diabetes mellitus without complications: Secondary | ICD-10-CM | POA: Diagnosis not present

## 2022-02-15 DIAGNOSIS — Z800 Family history of malignant neoplasm of digestive organs: Secondary | ICD-10-CM

## 2022-02-15 DIAGNOSIS — R29818 Other symptoms and signs involving the nervous system: Secondary | ICD-10-CM | POA: Diagnosis not present

## 2022-02-15 DIAGNOSIS — K2950 Unspecified chronic gastritis without bleeding: Secondary | ICD-10-CM | POA: Diagnosis not present

## 2022-02-15 DIAGNOSIS — Z888 Allergy status to other drugs, medicaments and biological substances status: Secondary | ICD-10-CM

## 2022-02-15 DIAGNOSIS — G8220 Paraplegia, unspecified: Secondary | ICD-10-CM | POA: Diagnosis not present

## 2022-02-15 DIAGNOSIS — R350 Frequency of micturition: Secondary | ICD-10-CM | POA: Diagnosis not present

## 2022-02-15 DIAGNOSIS — M4726 Other spondylosis with radiculopathy, lumbar region: Secondary | ICD-10-CM | POA: Diagnosis not present

## 2022-02-15 DIAGNOSIS — Z8249 Family history of ischemic heart disease and other diseases of the circulatory system: Secondary | ICD-10-CM

## 2022-02-15 DIAGNOSIS — R0902 Hypoxemia: Secondary | ICD-10-CM | POA: Diagnosis not present

## 2022-02-15 DIAGNOSIS — Z8616 Personal history of COVID-19: Secondary | ICD-10-CM

## 2022-02-15 DIAGNOSIS — N401 Enlarged prostate with lower urinary tract symptoms: Secondary | ICD-10-CM | POA: Diagnosis present

## 2022-02-15 DIAGNOSIS — M4316 Spondylolisthesis, lumbar region: Secondary | ICD-10-CM | POA: Diagnosis not present

## 2022-02-15 DIAGNOSIS — K922 Gastrointestinal hemorrhage, unspecified: Secondary | ICD-10-CM | POA: Diagnosis present

## 2022-02-15 DIAGNOSIS — Z791 Long term (current) use of non-steroidal anti-inflammatories (NSAID): Secondary | ICD-10-CM

## 2022-02-15 DIAGNOSIS — K269 Duodenal ulcer, unspecified as acute or chronic, without hemorrhage or perforation: Secondary | ICD-10-CM

## 2022-02-15 DIAGNOSIS — D649 Anemia, unspecified: Secondary | ICD-10-CM | POA: Diagnosis present

## 2022-02-15 DIAGNOSIS — Z7982 Long term (current) use of aspirin: Secondary | ICD-10-CM | POA: Diagnosis not present

## 2022-02-15 DIAGNOSIS — N138 Other obstructive and reflux uropathy: Secondary | ICD-10-CM | POA: Diagnosis present

## 2022-02-15 DIAGNOSIS — Z7984 Long term (current) use of oral hypoglycemic drugs: Secondary | ICD-10-CM | POA: Diagnosis not present

## 2022-02-15 DIAGNOSIS — R296 Repeated falls: Secondary | ICD-10-CM | POA: Diagnosis not present

## 2022-02-15 DIAGNOSIS — R3914 Feeling of incomplete bladder emptying: Secondary | ICD-10-CM | POA: Diagnosis present

## 2022-02-15 DIAGNOSIS — K254 Chronic or unspecified gastric ulcer with hemorrhage: Secondary | ICD-10-CM

## 2022-02-15 DIAGNOSIS — I491 Atrial premature depolarization: Secondary | ICD-10-CM | POA: Diagnosis not present

## 2022-02-15 DIAGNOSIS — S24103S Unspecified injury at T7-T10 level of thoracic spinal cord, sequela: Secondary | ICD-10-CM

## 2022-02-15 DIAGNOSIS — I959 Hypotension, unspecified: Secondary | ICD-10-CM | POA: Diagnosis present

## 2022-02-15 DIAGNOSIS — R6889 Other general symptoms and signs: Secondary | ICD-10-CM | POA: Diagnosis not present

## 2022-02-15 DIAGNOSIS — I10 Essential (primary) hypertension: Secondary | ICD-10-CM | POA: Diagnosis present

## 2022-02-15 DIAGNOSIS — G9340 Encephalopathy, unspecified: Secondary | ICD-10-CM | POA: Diagnosis not present

## 2022-02-15 DIAGNOSIS — K219 Gastro-esophageal reflux disease without esophagitis: Secondary | ICD-10-CM | POA: Diagnosis present

## 2022-02-15 DIAGNOSIS — K259 Gastric ulcer, unspecified as acute or chronic, without hemorrhage or perforation: Secondary | ICD-10-CM | POA: Diagnosis not present

## 2022-02-15 DIAGNOSIS — I499 Cardiac arrhythmia, unspecified: Secondary | ICD-10-CM | POA: Diagnosis not present

## 2022-02-15 DIAGNOSIS — Z79899 Other long term (current) drug therapy: Secondary | ICD-10-CM

## 2022-02-15 DIAGNOSIS — R531 Weakness: Secondary | ICD-10-CM | POA: Diagnosis not present

## 2022-02-15 DIAGNOSIS — K279 Peptic ulcer, site unspecified, unspecified as acute or chronic, without hemorrhage or perforation: Secondary | ICD-10-CM | POA: Diagnosis not present

## 2022-02-15 DIAGNOSIS — K250 Acute gastric ulcer with hemorrhage: Principal | ICD-10-CM | POA: Diagnosis present

## 2022-02-15 DIAGNOSIS — M48062 Spinal stenosis, lumbar region with neurogenic claudication: Secondary | ICD-10-CM

## 2022-02-15 DIAGNOSIS — D509 Iron deficiency anemia, unspecified: Secondary | ICD-10-CM | POA: Diagnosis not present

## 2022-02-15 DIAGNOSIS — Z743 Need for continuous supervision: Secondary | ICD-10-CM | POA: Diagnosis not present

## 2022-02-15 DIAGNOSIS — K2951 Unspecified chronic gastritis with bleeding: Secondary | ICD-10-CM | POA: Diagnosis not present

## 2022-02-15 HISTORY — DX: Gastrointestinal hemorrhage, unspecified: K92.2

## 2022-02-15 HISTORY — DX: Acute posthemorrhagic anemia: D62

## 2022-02-15 HISTORY — DX: Encephalopathy, unspecified: G93.40

## 2022-02-15 LAB — COMPREHENSIVE METABOLIC PANEL
ALT: 18 U/L (ref 0–44)
AST: 26 U/L (ref 15–41)
Albumin: 3.6 g/dL (ref 3.5–5.0)
Alkaline Phosphatase: 55 U/L (ref 38–126)
Anion gap: 9 (ref 5–15)
BUN: 37 mg/dL — ABNORMAL HIGH (ref 8–23)
CO2: 25 mmol/L (ref 22–32)
Calcium: 9 mg/dL (ref 8.9–10.3)
Chloride: 109 mmol/L (ref 98–111)
Creatinine, Ser: 1.43 mg/dL — ABNORMAL HIGH (ref 0.61–1.24)
GFR, Estimated: 52 mL/min — ABNORMAL LOW (ref >60)
Glucose, Bld: 182 mg/dL — ABNORMAL HIGH (ref 70–99)
Potassium: 3.8 mmol/L (ref 3.5–5.1)
Sodium: 143 mmol/L (ref 135–145)
Total Bilirubin: 0.3 mg/dL (ref 0.3–1.2)
Total Protein: 5.8 g/dL — ABNORMAL LOW (ref 6.5–8.1)

## 2022-02-15 LAB — URINALYSIS, ROUTINE W REFLEX MICROSCOPIC
Bacteria, UA: NONE SEEN
Bilirubin Urine: NEGATIVE
Glucose, UA: NEGATIVE mg/dL
Hgb urine dipstick: NEGATIVE
Ketones, ur: NEGATIVE mg/dL
Nitrite: NEGATIVE
Protein, ur: NEGATIVE mg/dL
Specific Gravity, Urine: 1.012 (ref 1.005–1.030)
pH: 6 (ref 5.0–8.0)

## 2022-02-15 LAB — RESP PANEL BY RT-PCR (FLU A&B, COVID) ARPGX2
Influenza A by PCR: NEGATIVE
Influenza B by PCR: NEGATIVE
SARS Coronavirus 2 by RT PCR: NEGATIVE

## 2022-02-15 LAB — CBC WITH DIFFERENTIAL/PLATELET
Abs Immature Granulocytes: 0.03 10*3/uL (ref 0.00–0.07)
Basophils Absolute: 0 10*3/uL (ref 0.0–0.1)
Basophils Relative: 0 %
Eosinophils Absolute: 0.1 10*3/uL (ref 0.0–0.5)
Eosinophils Relative: 2 %
HCT: 15.4 % — ABNORMAL LOW (ref 39.0–52.0)
Hemoglobin: 4.5 g/dL — CL (ref 13.0–17.0)
Immature Granulocytes: 1 %
Lymphocytes Relative: 14 %
Lymphs Abs: 0.7 10*3/uL (ref 0.7–4.0)
MCH: 30.6 pg (ref 26.0–34.0)
MCHC: 29.2 g/dL — ABNORMAL LOW (ref 30.0–36.0)
MCV: 104.8 fL — ABNORMAL HIGH (ref 80.0–100.0)
Monocytes Absolute: 0.2 10*3/uL (ref 0.1–1.0)
Monocytes Relative: 4 %
Neutro Abs: 3.9 10*3/uL (ref 1.7–7.7)
Neutrophils Relative %: 79 %
Platelets: 306 10*3/uL (ref 150–400)
RBC: 1.47 MIL/uL — ABNORMAL LOW (ref 4.22–5.81)
RDW: 16 % — ABNORMAL HIGH (ref 11.5–15.5)
WBC: 4.9 10*3/uL (ref 4.0–10.5)
nRBC: 1 % — ABNORMAL HIGH (ref 0.0–0.2)

## 2022-02-15 LAB — POC OCCULT BLOOD, ED: Fecal Occult Bld: POSITIVE — AB

## 2022-02-15 LAB — CBG MONITORING, ED: Glucose-Capillary: 167 mg/dL — ABNORMAL HIGH (ref 70–99)

## 2022-02-15 LAB — GLUCOSE, CAPILLARY: Glucose-Capillary: 111 mg/dL — ABNORMAL HIGH (ref 70–99)

## 2022-02-15 MED ORDER — TRAZODONE HCL 50 MG PO TABS
50.0000 mg | ORAL_TABLET | Freq: Every day | ORAL | Status: DC
  Administered 2022-02-15 – 2022-02-17 (×3): 50 mg via ORAL
  Filled 2022-02-15 (×3): qty 1

## 2022-02-15 MED ORDER — MIRABEGRON ER 25 MG PO TB24
25.0000 mg | ORAL_TABLET | Freq: Every day | ORAL | Status: DC
  Administered 2022-02-16 – 2022-02-18 (×3): 25 mg via ORAL
  Filled 2022-02-15 (×3): qty 1

## 2022-02-15 MED ORDER — ONDANSETRON HCL 4 MG PO TABS: 4.0000 mg | ORAL_TABLET | Freq: Four times a day (QID) | ORAL | Status: DC | PRN

## 2022-02-15 MED ORDER — SODIUM CHLORIDE 0.9% IV SOLUTION: Freq: Once | INTRAVENOUS | Status: DC

## 2022-02-15 MED ORDER — ACETAMINOPHEN 650 MG RE SUPP: 650.0000 mg | Freq: Four times a day (QID) | RECTAL | Status: DC | PRN

## 2022-02-15 MED ORDER — ATORVASTATIN CALCIUM 10 MG PO TABS
20.0000 mg | ORAL_TABLET | Freq: Every day | ORAL | Status: DC
  Administered 2022-02-16 – 2022-02-18 (×3): 20 mg via ORAL
  Filled 2022-02-15 (×3): qty 2

## 2022-02-15 MED ORDER — PANTOPRAZOLE 80MG IVPB - SIMPLE MED
80.0000 mg | Freq: Once | INTRAVENOUS | Status: AC
  Administered 2022-02-15: 80 mg via INTRAVENOUS
  Filled 2022-02-15: qty 80

## 2022-02-15 MED ORDER — FINASTERIDE 5 MG PO TABS
5.0000 mg | ORAL_TABLET | Freq: Every day | ORAL | Status: DC
  Administered 2022-02-16 – 2022-02-18 (×3): 5 mg via ORAL
  Filled 2022-02-15 (×3): qty 1

## 2022-02-15 MED ORDER — ALBUTEROL SULFATE (2.5 MG/3ML) 0.083% IN NEBU: 2.5000 mg | INHALATION_SOLUTION | Freq: Four times a day (QID) | RESPIRATORY_TRACT | Status: DC | PRN

## 2022-02-15 MED ORDER — SODIUM CHLORIDE 0.9 % IV BOLUS
1000.0000 mL | Freq: Once | INTRAVENOUS | Status: AC
  Administered 2022-02-15: 1000 mL via INTRAVENOUS

## 2022-02-15 MED ORDER — ONDANSETRON HCL 4 MG/2ML IJ SOLN: 4.0000 mg | Freq: Four times a day (QID) | INTRAMUSCULAR | Status: DC | PRN

## 2022-02-15 MED ORDER — SODIUM CHLORIDE 0.9% FLUSH
3.0000 mL | Freq: Two times a day (BID) | INTRAVENOUS | Status: DC
  Administered 2022-02-15 – 2022-02-18 (×5): 3 mL via INTRAVENOUS

## 2022-02-15 MED ORDER — PANTOPRAZOLE INFUSION (NEW) - SIMPLE MED
8.0000 mg/h | INTRAVENOUS | Status: DC
  Administered 2022-02-15 – 2022-02-16 (×3): 8 mg/h via INTRAVENOUS
  Filled 2022-02-15 (×3): qty 80

## 2022-02-15 MED ORDER — ACETAMINOPHEN 325 MG PO TABS: 650.0000 mg | ORAL_TABLET | Freq: Four times a day (QID) | ORAL | Status: DC | PRN

## 2022-02-15 NOTE — Consult Note (Signed)
Consultation  Referring Provider: Dr. Tamala Julian     Primary Care Physician:  Lorayne Marek Charlene Brooke, NP Primary Gastroenterologist:   Dr. Henrene Pastor      Reason for Consultation:   GI bleed         HPI:   Larry [name] is a 71 y.o. 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    Today, patient is seen with his daughter by his side who does assist with history as he has some altered mental status.  He tells me that he had been having some trouble with constipation over the past couple of weeks and was drinking Dandelion tea which seem to be working well and providing him with 1 bowel movement about twice a week, but then within the past week and 1/2 to 2 weeks per his daughter he has been seeing maroon-colored bloody stools, neither of them can tell me how often in the day this was occurring, but anytime that he had a bowel movement it was bloody with his last one this morning.  Along with this had been having increasing weakness, dizziness and some shortness of breath.     Also describes daily Meloxicam for joint pain and Aspirin 81 mg.    Currently also describes a little bit of arm tightness just above the area of his IV for blood transfusion.    Denies previous history of GI bleed, not on any anticoagulation other than aspirin, no nausea, vomiting pain.  ER course: 01/12/2022 hemoglobin 13.1--> 4.5 today, BUN 37, creatinine 1.43 and glucose 22, CT scan of the head with no abnormality, Hemoccult positive, 3 units PRBCs ordered  GI history: 11/21/2015 office visit with Dr. Henrene Pastor: At that time discussed inability to discriminate between gas and fecal material which was infrequent, recommended a probiotic and reviewed outside colonoscopy and started Omeprazole 20 mg daily 06/25/2003 colonoscopy with  Dr. Cristina Gong: Complete colonoscopy with excellent prep  Past Medical History:  Diagnosis Date   Abnormality of gait    Acute delirium 12/11/2011   Acute respiratory failure due to COVID-19 [facility]) 09/23/2019   CTS (carpal tunnel syndrome) 09/26/2015   Diabetes mellitus    Hypercholesterolemia    Hypertension    Kidney stones    Leukocytopenia 05/22/2013   MS (multiple sclerosis) ([place])    sees [facility]   T10 spinal cord injury ([place]) 12/12/2011   hx surgery 1996 with bilat gait abnormality since    Past Surgical History:  Procedure Laterality Date   BACK SURGERY     COLONOSCOPY     LUMBAR LAMINECTOMY      Family History  Problem Relation Age of Onset   Hypertension Mother    Liver cancer Father    Hypertension Sister     Social History   Tobacco Use   Smoking status: Never   Smokeless tobacco: Never  Vaping Use   Vaping Use: Never used  Substance Use Topics   Alcohol use: No   Drug use: No    Prior to Admission medications   Medication Sig Start Date End Date Taking? Authorizing Provider  aspirin 81 MG tablet Take 81 mg by mouth daily.   Yes [provider]  atorvastatin (LIPITOR) 20 MG tablet TAKE 1 TABLET BY MOUTH DAILY Patient taking differently: Take 20 mg by mouth  daily. 09/10/21  Yes Nche, Charlene Brooke, NP  baclofen (LIORESAL) 20 MG tablet Take 1 tablet (20 mg total) by mouth 3 (three) times daily. Patient taking differently: Take 20-40 mg by mouth See admin instructions. Take 20 mg by mouth in the morning and 40 mg by mouth at night. 07/14/21  Yes Nche, Charlene Brooke, NP  finasteride (PROSCAR) 5 MG tablet Take 1 tablet (5 mg total) by mouth daily. 07/14/21  Yes Nche, Charlene Brooke, NP  meloxicam (MOBIC) 15 MG tablet Take 1 tablet (15 mg total) by mouth daily as needed. for pain Patient taking differently: Take 15 mg by mouth daily. for pain 07/14/21  Yes Nche, Charlene Brooke, NP  metFORMIN (GLUCOPHAGE-XR) 750 MG 24 hr tablet Take 1 tablet (750 mg  total) by mouth daily with breakfast. 07/14/21  Yes Nche, Charlene Brooke, NP  mirabegron ER (MYRBETRIQ) 25 MG TB24 tablet Take 1 tablet (25 mg total) by mouth daily. 05/29/21  Yes Dutch Quint B, FNP  Multiple Vitamin (MULTIVITAMIN) tablet Take 1 tablet by mouth daily.   Yes [provider]  Olmesartan-amLODIPine-HCTZ 40-10-12.5 MG TABS Take 1 tablet by mouth daily. 07/14/21  Yes Nche, Charlene Brooke, NP  omeprazole (PRILOSEC) 20 MG capsule TAKE 1 CAPSULE BY MOUTH DAILY Patient taking differently: Take 20 mg by mouth daily. 10/14/21  Yes Nche, Charlene Brooke, NP  pregabalin (LYRICA) 100 MG capsule Take 1 capsule (100 mg total) by mouth 2 (two) times daily. 01/27/22  Yes Nche, Charlene Brooke, NP  traZODone (DESYREL) 50 MG tablet Take 1 tablet (50 mg total) by mouth at bedtime. 07/14/21  Yes Nche, Charlene Brooke, NP  ACCU-CHEK AVIVA PLUS test strip TEST FASTING BLOOD SUGAR  ONCE DAILY IN THE MORNING 11/18/21   Nche, Charlene Brooke, NP  Accu-Chek FastClix Lancets MISC  11/01/18   [provider]  Blood Glucose Calibration (ACCU-CHEK AVIVA) SOLN  10/15/11   [provider]  ferrous sulfate 325 (65 FE) MG tablet Take 1 tablet (325 mg total) by mouth daily with breakfast. Patient not taking: Reported on 01/12/2022 03/27/19   Nche, Charlene Brooke, NP  Incontinence Supply Disposable (DEPEND ADJUSTABLE UNDERWEAR LG) MISC Use as needed. 01/01/17   Marcial Pacas, MD  Lancets Misc. (ACCU-CHEK FASTCLIX LANCET) KIT 1 Units by Does not apply route daily before breakfast. 02/09/18   Nche, Charlene Brooke, NP    Current Facility-Administered Medications  Medication Dose Route Frequency Provider Last Rate Last Admin   0.9 %  sodium chloride infusion (Manually program via Guardrails IV Fluids)   Intravenous Once Norval Morton, MD   Held at 02/15/22 1227   acetaminophen (TYLENOL) tablet 650 mg  650 mg Oral Q6H PRN Norval Morton, MD       Or   acetaminophen (TYLENOL) suppository 650 mg  650 mg Rectal Q6H  PRN Fuller Plan A, MD       albuterol (PROVENTIL) (2.5 MG/3ML) 0.083% nebulizer solution 2.5 mg  2.5 mg Nebulization Q6H PRN Fuller Plan A, MD       ondansetron (ZOFRAN) tablet 4 mg  4 mg Oral Q6H PRN Fuller Plan A, MD       Or   ondansetron (ZOFRAN) injection 4 mg  4 mg Intravenous Q6H PRN Smith, Rondell A, MD       pantoprozole (PROTONIX) 80 mg /NS 100 mL infusion  8 mg/hr Intravenous Continuous Tamala Julian, Rondell A, MD 10 mL/hr at 02/15/22 1238 8 mg/hr at 02/15/22 1238   sodium chloride flush (NS) 0.9 %  injection 3 mL  3 mL Intravenous Q12H Smith, Rondell A, MD       Current Outpatient Medications  Medication Sig Dispense Refill   aspirin 81 MG tablet Take 81 mg by mouth daily.     atorvastatin (LIPITOR) 20 MG tablet TAKE 1 TABLET BY MOUTH DAILY (Patient taking differently: Take 20 mg by mouth daily.) 90 tablet 3   baclofen (LIORESAL) 20 MG tablet Take 1 tablet (20 mg total) by mouth 3 (three) times daily. (Patient taking differently: Take 20-40 mg by mouth See admin instructions. Take 20 mg by mouth in the morning and 40 mg by mouth at night.) 270 tablet 3   finasteride (PROSCAR) 5 MG tablet Take 1 tablet (5 mg total) by mouth daily. 90 tablet 3   meloxicam (MOBIC) 15 MG tablet Take 1 tablet (15 mg total) by mouth daily as needed. for pain (Patient taking differently: Take 15 mg by mouth daily. for pain) 90 tablet 3   metFORMIN (GLUCOPHAGE-XR) 750 MG 24 hr tablet Take 1 tablet (750 mg total) by mouth daily with breakfast. 90 tablet 3   mirabegron ER (MYRBETRIQ) 25 MG TB24 tablet Take 1 tablet (25 mg total) by mouth daily. 90 tablet 3   Multiple Vitamin (MULTIVITAMIN) tablet Take 1 tablet by mouth daily.     Olmesartan-amLODIPine-HCTZ 40-10-12.5 MG TABS Take 1 tablet by mouth daily. 90 tablet 3   omeprazole (PRILOSEC) 20 MG capsule TAKE 1 CAPSULE BY MOUTH DAILY (Patient taking differently: Take 20 mg by mouth daily.) 90 capsule 3   pregabalin (LYRICA) 100 MG capsule Take 1 capsule (100 mg  total) by mouth 2 (two) times daily. 180 capsule 1   traZODone (DESYREL) 50 MG tablet Take 1 tablet (50 mg total) by mouth at bedtime. 90 tablet 3   ACCU-CHEK AVIVA PLUS test strip TEST FASTING BLOOD SUGAR  ONCE DAILY IN THE MORNING 100 strip 3   Accu-Chek FastClix Lancets MISC      Blood Glucose Calibration (ACCU-CHEK AVIVA) SOLN      ferrous sulfate 325 (65 FE) MG tablet Take 1 tablet (325 mg total) by mouth daily with breakfast. (Patient not taking: Reported on 01/12/2022) 90 tablet 3   Incontinence Supply Disposable (DEPEND ADJUSTABLE UNDERWEAR LG) MISC Use as needed. 30 each 12   Lancets Misc. (ACCU-CHEK FASTCLIX LANCET) KIT 1 Units by Does not apply route daily before breakfast. 1 kit 11    Allergies as of 02/15/2022 - Review Complete 02/15/2022  Allergen Reaction Noted   Rosuvastatin Other (See Comments) 05/02/2020     Review of Systems:    Constitutional: No weight loss, fever or chills Skin: No rash  Cardiovascular: No chest pain Respiratory: +DOE Gastrointestinal: See HPI and otherwise negative Genitourinary: No dysuria  Neurological: +dizziness Musculoskeletal: No new muscle or joint pain Hematologic: No bruising Psychiatric: No history of depression or anxiety    Physical Exam:  Vital signs in last 24 hours: Temp:  [97.3 F (36.3 C)-97.9 F (36.6 C)] 97.9 F (36.6 C) (09/17 1321) Pulse Rate:  [59-98] 78 (09/17 1321) Resp:  [16-29] 25 (09/17 1321) BP: (116-141)/(47-98) 120/47 (09/17 1321) SpO2:  [96 %-100 %] 100 % (09/17 1305) Weight:  [75.8 kg] 75.8 kg (09/17 1047)   General:   Pleasant AA male appears to be in NAD, Well developed, Well nourished, alert and cooperative +AMS, repeats himself some and per daughter history is off Head:  Normocephalic and atraumatic. Eyes:   PEERL, EOMI. No icterus. Conjunctiva pink. Ears:  Normal  auditory acuity. Neck:  Supple Throat: Oral cavity and pharynx without inflammation, swelling or lesion.  Lungs: Respirations even and  unlabored. Lungs clear to auscultation bilaterally.   No wheezes, crackles, or rhonchi.  Heart: Normal S1, S2. No MRG. Regular rate and rhythm. 2+ pitting edema b/l LE  Abdomen:  Soft, nondistended, nontender. No rebound or guarding. Normal bowel sounds. No appreciable masses or hepatomegaly. Rectal:  Not performed.  Msk:  Symmetrical without gross deformities. Peripheral pulses intact.  Extremities:  Without edema, no deformity or joint abnormality.  Neurologic:  Alert and  oriented x4; intermittent spasm of upper extremities Skin:   Dry and intact without significant lesions or rashes. Psychiatric: +impaired short term memory   LAB RESULTS:    Latest Ref Rng & Units 02/15/2022   10:52 AM 01/12/2022    2:42 PM 07/14/2021    2:44 PM  CBC  WBC 4.0 - 10.5 K/uL 4.9  4.0  5.7   Hemoglobin 13.0 - 17.0 g/dL 4.5  13.1  12.6   Hematocrit 39.0 - 52.0 % 15.4  38.3  38.1   Platelets 150 - 400 K/uL 306  273.0  261.0     BMET Recent Labs    02/15/22 1052  NA 143  K 3.8  CL 109  CO2 25  GLUCOSE 182*  BUN 37*  CREATININE 1.43*  CALCIUM 9.0   LFT Recent Labs    02/15/22 1052  PROT 5.8*  ALBUMIN 3.6  AST 26  ALT 18  ALKPHOS 55  BILITOT 0.3    STUDIES: CT Head Wo Contrast  Result Date: 02/15/2022 CLINICAL DATA:  Neuro deficit.  Stroke suspected. EXAM: CT HEAD WITHOUT CONTRAST TECHNIQUE: Contiguous axial images were obtained from the base of the skull through the vertex without intravenous contrast. RADIATION DOSE REDUCTION: This exam was performed according to the departmental dose-optimization program which includes automated exposure control, adjustment of the mA and/or kV according to patient size and/or use of iterative reconstruction technique. COMPARISON:  Brain MRI 07/01/2018 FINDINGS: Brain: No evidence of acute infarction, hemorrhage, hydrocephalus, extra-axial collection or mass lesion/mass effect. There is mild patchy low-attenuation within the subcortical and periventricular  white matter compatible with chronic microvascular disease. Vascular: No hyperdense vessel or unexpected calcification. Skull: Normal. Negative for fracture or focal lesion. Sinuses/Orbits: No acute finding. Other: None. IMPRESSION: 1. No acute intracranial abnormalities. 2. Chronic small vessel ischemic disease. Electronically Signed   By: Kerby Moors M.D.   On: 02/15/2022 12:02   DG Chest Portable 1 View  Result Date: 02/15/2022 CLINICAL DATA:  Weakness. EXAM: PORTABLE CHEST 1 VIEW COMPARISON:  CT 05/23/2020 FINDINGS: There is mild cardiac enlargement. No pleural effusion or edema. No airspace opacities identified. Scarring noted in the left midlung. IMPRESSION: No acute cardiopulmonary abnormalities. Electronically Signed   By: Kerby Moors M.D.   On: 02/15/2022 11:57      Impression / Plan:   Impression: 1.  Acute blood loss anemia from GI bleed: Hemoccult positive stool, drop in hemoglobin from 13.1--> 4.5 at admission, BUN 37, family describes maroon looking stools for the past 2 weeks seems like off-and-on with the last this morning, no prior history of GI bleed, currently on Mobic and Aspirin; consider upper GI bleed versus lower GI bleed 2.  AKI 3.  Diabetes 4.  Acute encephalopathy: CT head negative, uncertain etiology 5.  MS/spinal stenosis/wheelchair-bound 6.  Bilateral lower extremity edema  Plan: 1.  Agree with Protonix drip 2.  We will plan for EGD tomorrow  with Dr. Tarri Glenn once patient's hemoglobin has improved, greater than 7.  Did go ahead and discuss risks, benefits, limitations and alternatives with the patient and his daughter and they agreed to proceed. 3.  Pending results from EGD could consider colonoscopy, that decision can be made after time of procedure tomorrow.  Daughter somewhat worried about patient having a colonoscopy as he "did not do well" after the last one back in 2015, apparently was "down", for some time.  Explains that it would only be done if we  cannot find the source on EGD. 4.  Patient can be on a clear liquid diet and n.p.o. at midnight 5.  Continue to monitor hemoglobin and transfuse as needed less than 7 6.  If patient has signs of acute GI bleed in the meantime could consider CTA (pending kidney function) or nuc med bleeding scan to help localize  Thank you for your kind consultation, we will continue to follow.  Lavone Nian Ayen Viviano  02/15/2022, 1:43 PM

## 2022-02-15 NOTE — H&P (Addendum)
History and Physical    Patient: Larry [name] MAU:[id number] DOB: 1950/08/04 DOA: 02/15/2022 DOS: the patient was seen and examined on 02/15/2022 PCP: Flossie Buffy, NP  Patient coming from: Home via EMS  Chief Complaint:  Chief Complaint  Patient presents with   Weakness   HPI: Larry [name] is a 71 y.o. male with medical history significant of hypertension, hyperlipidemia, diabetes mellitus type 2, multiple sclerosis, spinal cord injury, wheelchair-bound who presents with complaints of progressively worsening weakness over the last 2 weeks.  He had 2 falls while transferring here recently.  Once in the bathroom, and then once from the bed.  Patient denies any trauma to his head, injury, or loss of consciousness.  He had been having issues with constipation for which he has been on a dandelion tea.  However, patient thinks the dandelion teas led to him being dark red blood in his stools within the last 2 weeks.  He states that he has been constipated this week, but daughter states he had a bowel movement today that was dark red in color.  Patient takes meloxicam daily for joint pain as well as aspirin 81 mg daily, but denies use of any other NSAID use or blood thinner.  Patient's last colonoscopy had been with Dr. Cristina Gong in 2005 which noted no acute abnormality and reports doing a Cologuard test which was negative last year.  Associated symptoms include intermittent confusion and patient reports chronically having lower extremity swelling.  His daughter notes patient also seem short of breath and complained of some dizziness earlier today.  Progress note patient's hemoglobin had been 13.1 on 8/14.  Patient denies having any chest pain, nausea, vomiting, abdominal pain, or dysuria symptoms.  Denies ever having any GI bleed prior in the past.  Upon admission to the emergency department patient was noted to be afebrile with pulse 16-24, and all other vital signs relatively relatively stable.   Labs significant for hemoglobin 4.5, BUN 37, creatinine 1.43, and glucose 182.  CT scan of the head noted no acute abnormality.  Stool guaiacs were noted to be positive.  Patient has been typed and screened and ordered 3 units of packed red blood cells.  Due to initial soft blood pressure patient was given bolus 1 L of saline IV fluids and then placed on Protonix drip.   Review of Systems: As mentioned in the history of present illness. All other systems reviewed and are negative. Past Medical History:  Diagnosis Date   Abnormality of gait    Acute delirium 12/11/2011   Acute respiratory failure due to COVID-19 [facility]) 09/23/2019   CTS (carpal tunnel syndrome) 09/26/2015   Diabetes mellitus    Hypercholesterolemia    Hypertension    Kidney stones    Leukocytopenia 05/22/2013   MS (multiple sclerosis) ([place])    sees [facility]   T10 spinal cord injury ([place]) 12/12/2011   hx surgery 1996 with bilat gait abnormality since   Past Surgical History:  Procedure Laterality Date   BACK SURGERY     COLONOSCOPY     LUMBAR LAMINECTOMY     Social History:  reports that he has never smoked. He has never used smokeless tobacco. He reports that he does not drink alcohol and does not use drugs.  Allergies  Allergen Reactions   No Known Allergies    Rosuvastatin Other (See Comments)    Leg swelling    Family History  Problem Relation Age of Onset   Hypertension Mother  Liver cancer Father    Hypertension Sister     Prior to Admission medications   Medication Sig Start Date End Date Taking? Authorizing Provider  ACCU-CHEK AVIVA PLUS test strip TEST FASTING BLOOD SUGAR  ONCE DAILY IN THE MORNING 11/18/21   Nche, Charlene Brooke, NP  Accu-Chek FastClix Lancets MISC  11/01/18   [provider]  aspirin 81 MG tablet Take 81 mg by mouth daily.    [provider]  atorvastatin (LIPITOR) 20 MG tablet TAKE 1 TABLET BY MOUTH DAILY 09/10/21   Nche, Charlene Brooke, NP  baclofen  (LIORESAL) 20 MG tablet Take 1 tablet (20 mg total) by mouth 3 (three) times daily. 07/14/21   Nche, Charlene Brooke, NP  Blood Glucose Calibration (ACCU-CHEK AVIVA) SOLN  10/15/11   [provider]  ferrous sulfate 325 (65 FE) MG tablet Take 1 tablet (325 mg total) by mouth daily with breakfast. Patient not taking: Reported on 01/12/2022 03/27/19   Nche, Charlene Brooke, NP  finasteride (PROSCAR) 5 MG tablet Take 1 tablet (5 mg total) by mouth daily. 07/14/21   Nche, Charlene Brooke, NP  Incontinence Supply Disposable (DEPEND ADJUSTABLE UNDERWEAR LG) MISC Use as needed. 01/01/17   Marcial Pacas, MD  Lancets Misc. (ACCU-CHEK FASTCLIX LANCET) KIT 1 Units by Does not apply route daily before breakfast. 02/09/18   Nche, Charlene Brooke, NP  meloxicam (MOBIC) 15 MG tablet Take 1 tablet (15 mg total) by mouth daily as needed. for pain 07/14/21   Nche, Charlene Brooke, NP  metFORMIN (GLUCOPHAGE-XR) 750 MG 24 hr tablet Take 1 tablet (750 mg total) by mouth daily with breakfast. 07/14/21   Nche, Charlene Brooke, NP  mirabegron ER (MYRBETRIQ) 25 MG TB24 tablet Take 1 tablet (25 mg total) by mouth daily. 05/29/21   Dutch Quint B, FNP  Multiple Vitamin (MULTIVITAMIN) tablet Take 1 tablet by mouth daily.    [provider]  Olmesartan-amLODIPine-HCTZ 40-10-12.5 MG TABS Take 1 tablet by mouth daily. 07/14/21   Nche, Charlene Brooke, NP  omeprazole (PRILOSEC) 20 MG capsule TAKE 1 CAPSULE BY MOUTH DAILY 10/14/21   Nche, Charlene Brooke, NP  pregabalin (LYRICA) 100 MG capsule Take 1 capsule (100 mg total) by mouth 2 (two) times daily. 01/27/22   Nche, Charlene Brooke, NP  traZODone (DESYREL) 50 MG tablet Take 1 tablet (50 mg total) by mouth at bedtime. 07/14/21   Flossie Buffy, NP    Physical Exam: Vitals:   02/15/22 1047 02/15/22 1048 02/15/22 1115 02/15/22 1154  BP:  (!) 116/59 (!) 137/59 (!) 141/98  Pulse:  72 (!) 59 98  Resp:  (!) _0 Temp:  (!) 97.5 F (36.4 C)    TempSrc:  Oral    SpO2:  98% 100% 98%   Weight: 75.8 kg     Height: _1  (1.702 m)      Constitutional: Elderly male who appears to be no acute distress Eyes: PERRL, lids and conjunctivae normal ENMT: Mucous membranes are moist.  Neck: normal, supple Respiratory: clear to auscultation bilaterally, no wheezing, no crackles. Normal respiratory effort. No accessory muscle use.  Cardiovascular: Regular rate and rhythm, 2+ pitting bilateral lower extremity edema. 2+ pedal pulses.  Abdomen: no tenderness, no masses palpated.  Bowel sounds positive.  Musculoskeletal: no clubbing / cyanosis. No joint deformity upper and lower extremities.  Skin: no rashes, lesions, ulcers. No induration Neurologic: CN 2-12 grossly intact.  Intermittent jerking of the upper extremities appreciated Psychiatric: Alert and oriented to person, place,  and circumstance.  Patient seem to intermittently be in a daze and slow to answer questions.  Data Reviewed:  Labs, imaging, and pertinent records as noted above in HPI  Assessment and Plan:   Symptomatic anemia secondary to GI bleed Acute.  Patient presented with complaints of progressive weakness over the last week.  Hemoglobin noted to be 4.5, but previously had been 13.1 on 8/14.  Patient had been typed and screened and ordered 3 units of packed red blood cells along with being placed on a Protonix drip. -Admit to a progressive bed -N.p.o. until evaluated by GI -Continue with transfusion of 3 units of packed red blood cells -Continue Protonix drip -Hold aspirin and Mobic -Continue to monitor H&H and transfuse blood products as needed  Acute kidney injury Patient presents with creatinine elevated up to 1.43 with BUN 37.  Baseline creatinine previously noted to be around 1.  Greater than 0.3 increased to suggest acute kidney injury.  Thought secondary to acute blood loss. -Follow-up urinalysis -Avoid nephrotoxic agents -Recheck kidney function in a.m.  Controlled diabetes mellitus type 2, without  long-term use of insulin On admission glucose elevated at 182.  Last hemoglobin A1c 6.6 on 8/14.  Home medication regimen includes metformin. -Hypoglycemic protocols -Hold metformin  Transient hypotension Blood pressures noted to be as low as 120/47.  Home blood pressure medication regimen includes olmesartan-amlodipine-hydrochlorothiazide 40-10-12.5 mg daily. -Goal MAP greater than 65 -Hold home blood pressure medication regimen due to AKI and hypotension -Continue with blood transfusions  Acute encephalopathy Patient reported intermittent episodes of confusion.  CT scan of the head negative for any acute abnormality.   -Delirium precautions -Neurochecks -May warrant further investigation if symptoms persist  MS spinal stenosis paraplegia wheelchair-bound Patient ambulates with use of a wheelchair, but have been having issues with transfers due to weakness with report of 2 recent falls. -Continue baclofen and pregabalin once blood pressure stable -Will need to consult PT/OT to evaluate once patient's acute medical issues have been addressed  Bilateral lower extremity edema Chronic.  Patient with at least 2+ pitting bilateral lower extremity edema.  Chest x-ray did not note any acute abnormality.  BPH -Resume finasteride tomorrow  GERD -Continue Protonix drip  DVT prophylaxis: SCDs Advance Care Planning:   Code Status: Full Code    Consults: GI   Family Communication: Daughter and son-in-law updated at bedside  Severity of Illness: The appropriate patient status for this patient is INPATIENT. Inpatient status is judged to be reasonable and necessary in order to provide the required intensity of service to ensure the patient's safety. The patient's presenting symptoms, physical exam findings, and initial radiographic and laboratory data in the context of their chronic comorbidities is felt to place them at high risk for further clinical deterioration. Furthermore, it is not  anticipated that the patient will be medically stable for discharge from the hospital within 2 midnights of admission.   * I certify that at the point of admission it is my clinical judgment that the patient will require inpatient hospital care spanning beyond 2 midnights from the point of admission due to high intensity of service, high risk for further deterioration and high frequency of surveillance required.*  Author: Norval Morton, MD 02/15/2022 12:28 PM  For on call review [email or web address].

## 2022-02-15 NOTE — H&P (View-Only) (Signed)
Consultation  Referring Provider: Dr. Tamala Julian     Primary Care Physician:  Lorayne Marek Charlene Brooke, NP Primary Gastroenterologist:   Dr. Henrene Pastor      Reason for Consultation:   GI bleed         HPI:   Larry [name] is a 71 y.o. 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    Today, patient is seen with his daughter by his side who does assist with history as he has some altered mental status.  He tells me that he had been having some trouble with constipation over the past couple of weeks and was drinking Dandelion tea which seem to be working well and providing him with 1 bowel movement about twice a week, but then within the past week and 1/2 to 2 weeks per his daughter he has been seeing maroon-colored bloody stools, neither of them can tell me how often in the day this was occurring, but anytime that he had a bowel movement it was bloody with his last one this morning.  Along with this had been having increasing weakness, dizziness and some shortness of breath.     Also describes daily Meloxicam for joint pain and Aspirin 81 mg.    Currently also describes a little bit of arm tightness just above the area of his IV for blood transfusion.    Denies previous history of GI bleed, not on any anticoagulation other than aspirin, no nausea, vomiting pain.  ER course: 01/12/2022 hemoglobin 13.1--> 4.5 today, BUN 37, creatinine 1.43 and glucose 22, CT scan of the head with no abnormality, Hemoccult positive, 3 units PRBCs ordered  GI history: 11/21/2015 office visit with Dr. Henrene Pastor: At that time discussed inability to discriminate between gas and fecal material which was infrequent, recommended a probiotic and reviewed outside colonoscopy and started Omeprazole 20 mg daily 06/25/2003 colonoscopy with  Dr. Cristina Gong: Complete colonoscopy with excellent prep  Past Medical History:  Diagnosis Date   Abnormality of gait    Acute delirium 12/11/2011   Acute respiratory failure due to COVID-19 [facility]) 09/23/2019   CTS (carpal tunnel syndrome) 09/26/2015   Diabetes mellitus    Hypercholesterolemia    Hypertension    Kidney stones    Leukocytopenia 05/22/2013   MS (multiple sclerosis) ([place])    sees [facility]   T10 spinal cord injury ([place]) 12/12/2011   hx surgery 1996 with bilat gait abnormality since    Past Surgical History:  Procedure Laterality Date   BACK SURGERY     COLONOSCOPY     LUMBAR LAMINECTOMY      Family History  Problem Relation Age of Onset   Hypertension Mother    Liver cancer Father    Hypertension Sister     Social History   Tobacco Use   Smoking status: Never   Smokeless tobacco: Never  Vaping Use   Vaping Use: Never used  Substance Use Topics   Alcohol use: No   Drug use: No    Prior to Admission medications   Medication Sig Start Date End Date Taking? Authorizing Provider  aspirin 81 MG tablet Take 81 mg by mouth daily.   Yes [provider]  atorvastatin (LIPITOR) 20 MG tablet TAKE 1 TABLET BY MOUTH DAILY Patient taking differently: Take 20 mg by mouth  daily. 09/10/21  Yes Nche, Charlene Brooke, NP  baclofen (LIORESAL) 20 MG tablet Take 1 tablet (20 mg total) by mouth 3 (three) times daily. Patient taking differently: Take 20-40 mg by mouth See admin instructions. Take 20 mg by mouth in the morning and 40 mg by mouth at night. 07/14/21  Yes Nche, Charlene Brooke, NP  finasteride (PROSCAR) 5 MG tablet Take 1 tablet (5 mg total) by mouth daily. 07/14/21  Yes Nche, Charlene Brooke, NP  meloxicam (MOBIC) 15 MG tablet Take 1 tablet (15 mg total) by mouth daily as needed. for pain Patient taking differently: Take 15 mg by mouth daily. for pain 07/14/21  Yes Nche, Charlene Brooke, NP  metFORMIN (GLUCOPHAGE-XR) 750 MG 24 hr tablet Take 1 tablet (750 mg  total) by mouth daily with breakfast. 07/14/21  Yes Nche, Charlene Brooke, NP  mirabegron ER (MYRBETRIQ) 25 MG TB24 tablet Take 1 tablet (25 mg total) by mouth daily. 05/29/21  Yes Dutch Quint B, FNP  Multiple Vitamin (MULTIVITAMIN) tablet Take 1 tablet by mouth daily.   Yes [provider]  Olmesartan-amLODIPine-HCTZ 40-10-12.5 MG TABS Take 1 tablet by mouth daily. 07/14/21  Yes Nche, Charlene Brooke, NP  omeprazole (PRILOSEC) 20 MG capsule TAKE 1 CAPSULE BY MOUTH DAILY Patient taking differently: Take 20 mg by mouth daily. 10/14/21  Yes Nche, Charlene Brooke, NP  pregabalin (LYRICA) 100 MG capsule Take 1 capsule (100 mg total) by mouth 2 (two) times daily. 01/27/22  Yes Nche, Charlene Brooke, NP  traZODone (DESYREL) 50 MG tablet Take 1 tablet (50 mg total) by mouth at bedtime. 07/14/21  Yes Nche, Charlene Brooke, NP  ACCU-CHEK AVIVA PLUS test strip TEST FASTING BLOOD SUGAR  ONCE DAILY IN THE MORNING 11/18/21   Nche, Charlene Brooke, NP  Accu-Chek FastClix Lancets MISC  11/01/18   [provider]  Blood Glucose Calibration (ACCU-CHEK AVIVA) SOLN  10/15/11   [provider]  ferrous sulfate 325 (65 FE) MG tablet Take 1 tablet (325 mg total) by mouth daily with breakfast. Patient not taking: Reported on 01/12/2022 03/27/19   Nche, Charlene Brooke, NP  Incontinence Supply Disposable (DEPEND ADJUSTABLE UNDERWEAR LG) MISC Use as needed. 01/01/17   Marcial Pacas, MD  Lancets Misc. (ACCU-CHEK FASTCLIX LANCET) KIT 1 Units by Does not apply route daily before breakfast. 02/09/18   Nche, Charlene Brooke, NP    Current Facility-Administered Medications  Medication Dose Route Frequency Provider Last Rate Last Admin   0.9 %  sodium chloride infusion (Manually program via Guardrails IV Fluids)   Intravenous Once Norval Morton, MD   Held at 02/15/22 1227   acetaminophen (TYLENOL) tablet 650 mg  650 mg Oral Q6H PRN Norval Morton, MD       Or   acetaminophen (TYLENOL) suppository 650 mg  650 mg Rectal Q6H  PRN Fuller Plan A, MD       albuterol (PROVENTIL) (2.5 MG/3ML) 0.083% nebulizer solution 2.5 mg  2.5 mg Nebulization Q6H PRN Fuller Plan A, MD       ondansetron (ZOFRAN) tablet 4 mg  4 mg Oral Q6H PRN Fuller Plan A, MD       Or   ondansetron (ZOFRAN) injection 4 mg  4 mg Intravenous Q6H PRN Smith, Rondell A, MD       pantoprozole (PROTONIX) 80 mg /NS 100 mL infusion  8 mg/hr Intravenous Continuous Tamala Julian, Rondell A, MD 10 mL/hr at 02/15/22 1238 8 mg/hr at 02/15/22 1238   sodium chloride flush (NS) 0.9 %  injection 3 mL  3 mL Intravenous Q12H Smith, Rondell A, MD       Current Outpatient Medications  Medication Sig Dispense Refill   aspirin 81 MG tablet Take 81 mg by mouth daily.     atorvastatin (LIPITOR) 20 MG tablet TAKE 1 TABLET BY MOUTH DAILY (Patient taking differently: Take 20 mg by mouth daily.) 90 tablet 3   baclofen (LIORESAL) 20 MG tablet Take 1 tablet (20 mg total) by mouth 3 (three) times daily. (Patient taking differently: Take 20-40 mg by mouth See admin instructions. Take 20 mg by mouth in the morning and 40 mg by mouth at night.) 270 tablet 3   finasteride (PROSCAR) 5 MG tablet Take 1 tablet (5 mg total) by mouth daily. 90 tablet 3   meloxicam (MOBIC) 15 MG tablet Take 1 tablet (15 mg total) by mouth daily as needed. for pain (Patient taking differently: Take 15 mg by mouth daily. for pain) 90 tablet 3   metFORMIN (GLUCOPHAGE-XR) 750 MG 24 hr tablet Take 1 tablet (750 mg total) by mouth daily with breakfast. 90 tablet 3   mirabegron ER (MYRBETRIQ) 25 MG TB24 tablet Take 1 tablet (25 mg total) by mouth daily. 90 tablet 3   Multiple Vitamin (MULTIVITAMIN) tablet Take 1 tablet by mouth daily.     Olmesartan-amLODIPine-HCTZ 40-10-12.5 MG TABS Take 1 tablet by mouth daily. 90 tablet 3   omeprazole (PRILOSEC) 20 MG capsule TAKE 1 CAPSULE BY MOUTH DAILY (Patient taking differently: Take 20 mg by mouth daily.) 90 capsule 3   pregabalin (LYRICA) 100 MG capsule Take 1 capsule (100 mg  total) by mouth 2 (two) times daily. 180 capsule 1   traZODone (DESYREL) 50 MG tablet Take 1 tablet (50 mg total) by mouth at bedtime. 90 tablet 3   ACCU-CHEK AVIVA PLUS test strip TEST FASTING BLOOD SUGAR  ONCE DAILY IN THE MORNING 100 strip 3   Accu-Chek FastClix Lancets MISC      Blood Glucose Calibration (ACCU-CHEK AVIVA) SOLN      ferrous sulfate 325 (65 FE) MG tablet Take 1 tablet (325 mg total) by mouth daily with breakfast. (Patient not taking: Reported on 01/12/2022) 90 tablet 3   Incontinence Supply Disposable (DEPEND ADJUSTABLE UNDERWEAR LG) MISC Use as needed. 30 each 12   Lancets Misc. (ACCU-CHEK FASTCLIX LANCET) KIT 1 Units by Does not apply route daily before breakfast. 1 kit 11    Allergies as of 02/15/2022 - Review Complete 02/15/2022  Allergen Reaction Noted   Rosuvastatin Other (See Comments) 05/02/2020     Review of Systems:    Constitutional: No weight loss, fever or chills Skin: No rash  Cardiovascular: No chest pain Respiratory: +DOE Gastrointestinal: See HPI and otherwise negative Genitourinary: No dysuria  Neurological: +dizziness Musculoskeletal: No new muscle or joint pain Hematologic: No bruising Psychiatric: No history of depression or anxiety    Physical Exam:  Vital signs in last 24 hours: Temp:  [97.3 F (36.3 C)-97.9 F (36.6 C)] 97.9 F (36.6 C) (09/17 1321) Pulse Rate:  [59-98] 78 (09/17 1321) Resp:  [16-29] 25 (09/17 1321) BP: (116-141)/(47-98) 120/47 (09/17 1321) SpO2:  [96 %-100 %] 100 % (09/17 1305) Weight:  [75.8 kg] 75.8 kg (09/17 1047)   General:   Pleasant AA male appears to be in NAD, Well developed, Well nourished, alert and cooperative +AMS, repeats himself some and per daughter history is off Head:  Normocephalic and atraumatic. Eyes:   PEERL, EOMI. No icterus. Conjunctiva pink. Ears:  Normal  auditory acuity. Neck:  Supple Throat: Oral cavity and pharynx without inflammation, swelling or lesion.  Lungs: Respirations even and  unlabored. Lungs clear to auscultation bilaterally.   No wheezes, crackles, or rhonchi.  Heart: Normal S1, S2. No MRG. Regular rate and rhythm. 2+ pitting edema b/l LE  Abdomen:  Soft, nondistended, nontender. No rebound or guarding. Normal bowel sounds. No appreciable masses or hepatomegaly. Rectal:  Not performed.  Msk:  Symmetrical without gross deformities. Peripheral pulses intact.  Extremities:  Without edema, no deformity or joint abnormality.  Neurologic:  Alert and  oriented x4; intermittent spasm of upper extremities Skin:   Dry and intact without significant lesions or rashes. Psychiatric: +impaired short term memory   LAB RESULTS:    Latest Ref Rng & Units 02/15/2022   10:52 AM 01/12/2022    2:42 PM 07/14/2021    2:44 PM  CBC  WBC 4.0 - 10.5 K/uL 4.9  4.0  5.7   Hemoglobin 13.0 - 17.0 g/dL 4.5  13.1  12.6   Hematocrit 39.0 - 52.0 % 15.4  38.3  38.1   Platelets 150 - 400 K/uL 306  273.0  261.0     BMET Recent Labs    02/15/22 1052  NA 143  K 3.8  CL 109  CO2 25  GLUCOSE 182*  BUN 37*  CREATININE 1.43*  CALCIUM 9.0   LFT Recent Labs    02/15/22 1052  PROT 5.8*  ALBUMIN 3.6  AST 26  ALT 18  ALKPHOS 55  BILITOT 0.3    STUDIES: CT Head Wo Contrast  Result Date: 02/15/2022 CLINICAL DATA:  Neuro deficit.  Stroke suspected. EXAM: CT HEAD WITHOUT CONTRAST TECHNIQUE: Contiguous axial images were obtained from the base of the skull through the vertex without intravenous contrast. RADIATION DOSE REDUCTION: This exam was performed according to the departmental dose-optimization program which includes automated exposure control, adjustment of the mA and/or kV according to patient size and/or use of iterative reconstruction technique. COMPARISON:  Brain MRI 07/01/2018 FINDINGS: Brain: No evidence of acute infarction, hemorrhage, hydrocephalus, extra-axial collection or mass lesion/mass effect. There is mild patchy low-attenuation within the subcortical and periventricular  white matter compatible with chronic microvascular disease. Vascular: No hyperdense vessel or unexpected calcification. Skull: Normal. Negative for fracture or focal lesion. Sinuses/Orbits: No acute finding. Other: None. IMPRESSION: 1. No acute intracranial abnormalities. 2. Chronic small vessel ischemic disease. Electronically Signed   By: Kerby Moors M.D.   On: 02/15/2022 12:02   DG Chest Portable 1 View  Result Date: 02/15/2022 CLINICAL DATA:  Weakness. EXAM: PORTABLE CHEST 1 VIEW COMPARISON:  CT 05/23/2020 FINDINGS: There is mild cardiac enlargement. No pleural effusion or edema. No airspace opacities identified. Scarring noted in the left midlung. IMPRESSION: No acute cardiopulmonary abnormalities. Electronically Signed   By: Kerby Moors M.D.   On: 02/15/2022 11:57      Impression / Plan:   Impression: 1.  Acute blood loss anemia from GI bleed: Hemoccult positive stool, drop in hemoglobin from 13.1--> 4.5 at admission, BUN 37, family describes maroon looking stools for the past 2 weeks seems like off-and-on with the last this morning, no prior history of GI bleed, currently on Mobic and Aspirin; consider upper GI bleed versus lower GI bleed 2.  AKI 3.  Diabetes 4.  Acute encephalopathy: CT head negative, uncertain etiology 5.  MS/spinal stenosis/wheelchair-bound 6.  Bilateral lower extremity edema  Plan: 1.  Agree with Protonix drip 2.  We will plan for EGD tomorrow  with Dr. Tarri Glenn once patient's hemoglobin has improved, greater than 7.  Did go ahead and discuss risks, benefits, limitations and alternatives with the patient and his daughter and they agreed to proceed. 3.  Pending results from EGD could consider colonoscopy, that decision can be made after time of procedure tomorrow.  Daughter somewhat worried about patient having a colonoscopy as he "did not do well" after the last one back in 2015, apparently was "down", for some time.  Explains that it would only be done if we  cannot find the source on EGD. 4.  Patient can be on a clear liquid diet and n.p.o. at midnight 5.  Continue to monitor hemoglobin and transfuse as needed less than 7 6.  If patient has signs of acute GI bleed in the meantime could consider CTA (pending kidney function) or nuc med bleeding scan to help localize  Thank you for your kind consultation, we will continue to follow.  Lavone Nian Daryan Buell  02/15/2022, 1:43 PM

## 2022-02-15 NOTE — ED Triage Notes (Signed)
PT BIB [facility] from home c/o generalized weakness x1 week. Pt also states he has noticed some dark blood in his stool a week ago but has not had a bowel movement since. Tech noticed some bright red blood upon cleaning him up off the EMS stretcher. PT denies any pain at this time. Pt normally uses a wheelchair to get around and has been unable to get himself in the chair for the last week.

## 2022-02-15 NOTE — Progress Notes (Signed)
Pt. Arrived to unit alert and oriented, no c/o pain VSS. Daughter at bedside. Bed in lowest position call bell within reach

## 2022-02-15 NOTE — ED Provider Notes (Signed)
[name]   CSN: [id number] Arrival date & time: 02/15/22  1042     History PMH: MS, HTN, DM, HLD, T10 SCI now wheelchair bound Chief Complaint  Patient presents with   Weakness    Larry [name] is a 71 y.o. male. Presents with generalized weakness and multiple falls over the past 2 weeks.  He reports difficulty with transfers from his wheelchair and has fallen multiple times during transferring.  He states that earlier this week he started noticing some dark red stools which he has never had before.  His daughter at bedside also notes that he has been more confused than normal and seems to come in and out frequently.  He has baseline ambulatory dysfunction due to his MS and a prior spinal cord injury and has limited movement in his lower extremities.  He is not on any blood thinners.    He denies chest pain, shortness of breath, abdominal pain, nausea, vomiting, diarrhea, fevers, chills, dizziness, numbness.    Weakness      Home Medications Prior to Admission medications   Medication Sig Start Date End Date Taking? Authorizing Provider  aspirin 81 MG tablet Take 81 mg by mouth daily.   Yes [provider]  atorvastatin (LIPITOR) 20 MG tablet TAKE 1 TABLET BY MOUTH DAILY Patient taking differently: Take 20 mg by mouth daily. 09/10/21  Yes Nche, Charlene Brooke, NP  baclofen (LIORESAL) 20 MG tablet Take 1 tablet (20 mg total) by mouth 3 (three) times daily. Patient taking differently: Take 20-40 mg by mouth See admin instructions. Take 20 mg by mouth in the morning and 40 mg by mouth at night. 07/14/21  Yes Nche, Charlene Brooke, NP  finasteride (PROSCAR) 5 MG tablet Take 1 tablet (5 mg total) by mouth daily. 07/14/21  Yes Nche, Charlene Brooke, NP  meloxicam (MOBIC) 15 MG tablet Take 1 tablet (15 mg total) by mouth daily as needed. for pain Patient taking differently: Take 15 mg by mouth daily. for pain 07/14/21  Yes  Nche, Charlene Brooke, NP  metFORMIN (GLUCOPHAGE-XR) 750 MG 24 hr tablet Take 1 tablet (750 mg total) by mouth daily with breakfast. 07/14/21  Yes Nche, Charlene Brooke, NP  mirabegron ER (MYRBETRIQ) 25 MG TB24 tablet Take 1 tablet (25 mg total) by mouth daily. 05/29/21  Yes Dutch Quint B, FNP  Multiple Vitamin (MULTIVITAMIN) tablet Take 1 tablet by mouth daily.   Yes [provider]  Olmesartan-amLODIPine-HCTZ 40-10-12.5 MG TABS Take 1 tablet by mouth daily. 07/14/21  Yes Nche, Charlene Brooke, NP  omeprazole (PRILOSEC) 20 MG capsule TAKE 1 CAPSULE BY MOUTH DAILY Patient taking differently: Take 20 mg by mouth daily. 10/14/21  Yes Nche, Charlene Brooke, NP  pregabalin (LYRICA) 100 MG capsule Take 1 capsule (100 mg total) by mouth 2 (two) times daily. 01/27/22  Yes Nche, Charlene Brooke, NP  traZODone (DESYREL) 50 MG tablet Take 1 tablet (50 mg total) by mouth at bedtime. 07/14/21  Yes Nche, Charlene Brooke, NP  ACCU-CHEK AVIVA PLUS test strip TEST FASTING BLOOD SUGAR  ONCE DAILY IN THE MORNING 11/18/21   Nche, Charlene Brooke, NP  Accu-Chek FastClix Lancets MISC  11/01/18   [provider]  Blood Glucose Calibration (ACCU-CHEK AVIVA) SOLN  10/15/11   [provider]  ferrous sulfate 325 (65 FE) MG tablet Take 1 tablet (325 mg total) by mouth daily with breakfast. Patient not taking: Reported on 01/12/2022 03/27/19   Nche, Charlene Brooke, NP  Incontinence Supply Disposable (DEPEND ADJUSTABLE UNDERWEAR LG) MISC Use as needed. 01/01/17   Marcial Pacas, MD  Lancets Misc. (ACCU-CHEK FASTCLIX LANCET) KIT 1 Units by Does not apply route daily before breakfast. 02/09/18   Nche, Charlene Brooke, NP      Allergies    Rosuvastatin    Review of Systems   Review of Systems  Gastrointestinal:  Positive for blood in stool.  Neurological:  Positive for weakness.       Generalized weakness  All other systems reviewed and are negative.   Physical Exam Updated Vital Signs BP (!) 137/91   Pulse 66   Temp  97.7 F (36.5 C) (Oral)   Resp 19   Ht 5' 7" (1.702 m)   Wt 75.8 kg   SpO2 100%   BMI 26.16 kg/m  Physical Exam Vitals and nursing note reviewed.  Constitutional:      General: He is not in acute distress.    Appearance: Normal appearance. He is not ill-appearing, toxic-appearing or diaphoretic.  HENT:     Head: Normocephalic and atraumatic.     Nose: No nasal deformity.     Mouth/Throat:     Lips: Pink. No lesions.     Mouth: Mucous membranes are moist. No injury, lacerations, oral lesions or angioedema.     Pharynx: Oropharynx is clear. Uvula midline. No pharyngeal swelling, oropharyngeal exudate, posterior oropharyngeal erythema or uvula swelling.  Eyes:     General: Gaze aligned appropriately. No scleral icterus.       Right eye: No discharge.        Left eye: No discharge.     Extraocular Movements: Extraocular movements intact.     Conjunctiva/sclera: Conjunctivae normal.     Right eye: Right conjunctiva is not injected. No exudate or hemorrhage.    Left eye: Left conjunctiva is not injected. No exudate or hemorrhage.    Pupils: Pupils are equal, round, and reactive to light.  Cardiovascular:     Rate and Rhythm: Normal rate and regular rhythm.     Pulses: Normal pulses.          Radial pulses are 2+ on the right side and 2+ on the left side.       Dorsalis pedis pulses are 2+ on the right side and 2+ on the left side.     Heart sounds: Normal heart sounds, S1 normal and S2 normal. Heart sounds not distant. No murmur heard.    No friction rub. No gallop. No S3 or S4 sounds.  Pulmonary:     Effort: Pulmonary effort is normal. No accessory muscle usage or respiratory distress.     Breath sounds: Normal breath sounds. No stridor. No wheezing, rhonchi or rales.  Abdominal:     General: Abdomen is flat. There is no distension.     Palpations: Abdomen is soft. There is no mass or pulsatile mass.     Tenderness: There is no abdominal tenderness. There is no right CVA  tenderness, left CVA tenderness, guarding or rebound.  Musculoskeletal:     Right lower leg: No edema.     Left lower leg: No edema.  Skin:    General: Skin is warm and dry.     Coloration: Skin is not jaundiced or pale.     Findings: No bruising, erythema, lesion or rash.  Neurological:     General: No focal deficit present.     Mental Status: He is alert and oriented to person, place, and time. Mental  status is at baseline.     GCS: GCS eye subscore is 4. GCS verbal subscore is 5. GCS motor subscore is 6.     Comments: RUE/LUE: 5/5 strength equal LLE/RLE: 1/5 strength (baseline)  Psychiatric:        Mood and Affect: Mood normal.        Behavior: Behavior normal. Behavior is cooperative.     ED Results / Procedures / Treatments   Labs (all labs ordered are listed, but only abnormal results are displayed) Labs Reviewed  COMPREHENSIVE METABOLIC PANEL - Abnormal; Notable for the following components:      Result Value   Glucose, Bld 182 (*)    BUN 37 (*)    Creatinine, Ser 1.43 (*)    Total Protein 5.8 (*)    GFR, Estimated 52 (*)    All other components within normal limits  CBC WITH DIFFERENTIAL/PLATELET - Abnormal; Notable for the following components:   RBC 1.47 (*)    Hemoglobin 4.5 (*)    HCT 15.4 (*)    MCV 104.8 (*)    MCHC 29.2 (*)    RDW 16.0 (*)    nRBC 1.0 (*)    All other components within normal limits  POC OCCULT BLOOD, ED - Abnormal; Notable for the following components:   Fecal Occult Bld POSITIVE (*)    All other components within normal limits  CBG MONITORING, ED - Abnormal; Notable for the following components:   Glucose-Capillary 167 (*)    All other components within normal limits  RESP PANEL BY RT-PCR (FLU A&B, COVID) ARPGX2  URINALYSIS, ROUTINE W REFLEX MICROSCOPIC  HEMOGLOBIN AND HEMATOCRIT, BLOOD  TYPE AND SCREEN  PREPARE RBC (CROSSMATCH)    EKG None  Radiology CT Head Wo Contrast  Result Date: 02/15/2022 CLINICAL DATA:  Neuro  deficit.  Stroke suspected. EXAM: CT HEAD WITHOUT CONTRAST TECHNIQUE: Contiguous axial images were obtained from the base of the skull through the vertex without intravenous contrast. RADIATION DOSE REDUCTION: This exam was performed according to the departmental dose-optimization program which includes automated exposure control, adjustment of the mA and/or kV according to patient size and/or use of iterative reconstruction technique. COMPARISON:  Brain MRI 07/01/2018 FINDINGS: Brain: No evidence of acute infarction, hemorrhage, hydrocephalus, extra-axial collection or mass lesion/mass effect. There is mild patchy low-attenuation within the subcortical and periventricular white matter compatible with chronic microvascular disease. Vascular: No hyperdense vessel or unexpected calcification. Skull: Normal. Negative for fracture or focal lesion. Sinuses/Orbits: No acute finding. Other: None. IMPRESSION: 1. No acute intracranial abnormalities. 2. Chronic small vessel ischemic disease. Electronically Signed   By: Kerby Moors M.D.   On: 02/15/2022 12:02   DG Chest Portable 1 View  Result Date: 02/15/2022 CLINICAL DATA:  Weakness. EXAM: PORTABLE CHEST 1 VIEW COMPARISON:  CT 05/23/2020 FINDINGS: There is mild cardiac enlargement. No pleural effusion or edema. No airspace opacities identified. Scarring noted in the left midlung. IMPRESSION: No acute cardiopulmonary abnormalities. Electronically Signed   By: Kerby Moors M.D.   On: 02/15/2022 11:57    Procedures .Critical Care  Performed by: Adolphus Birchwood, PA-C Authorized by: Adolphus Birchwood, PA-C   Critical care provider statement:    Critical care time (minutes):  45   Critical care time was exclusive of:  Separately billable procedures and treating other patients   Critical care was necessary to treat or prevent imminent or life-threatening deterioration of the following conditions: acute blood loss anemia.   Critical care was time spent  personally by me on the following activities:  Blood draw for specimens, development of treatment plan with patient or surrogate, discussions with consultants, discussions with primary provider, evaluation of patient's response to treatment, examination of patient, obtaining history from patient or surrogate, review of old charts, re-evaluation of patient's condition, pulse oximetry, ordering and review of radiographic studies, ordering and review of laboratory studies and ordering and performing treatments and interventions   Care discussed with: admitting provider     This patient was on telemetry or cardiac monitoring during their time in the ED.    Medications Ordered in ED Medications  0.9 %  sodium chloride infusion (Manually program via Guardrails IV Fluids) (0 mLs Intravenous Hold 02/15/22 1227)  pantoprozole (PROTONIX) 80 mg /NS 100 mL infusion (8 mg/hr Intravenous New Bag/Given 02/15/22 1238)  sodium chloride flush (NS) 0.9 % injection 3 mL (has no administration in time range)  acetaminophen (TYLENOL) tablet 650 mg (has no administration in time range)    Or  acetaminophen (TYLENOL) suppository 650 mg (has no administration in time range)  ondansetron (ZOFRAN) tablet 4 mg (has no administration in time range)    Or  ondansetron (ZOFRAN) injection 4 mg (has no administration in time range)  albuterol (PROVENTIL) (2.5 MG/3ML) 0.083% nebulizer solution 2.5 mg (has no administration in time range)  atorvastatin (LIPITOR) tablet 20 mg (has no administration in time range)  traZODone (DESYREL) tablet 50 mg (has no administration in time range)  finasteride (PROSCAR) tablet 5 mg (has no administration in time range)  mirabegron ER (MYRBETRIQ) tablet 25 mg (has no administration in time range)  sodium chloride 0.9 % bolus 1,000 mL (1,000 mLs Intravenous New Bag/Given 02/15/22 1225)  pantoprazole (PROTONIX) 80 mg /NS 100 mL IVPB (0 mg Intravenous Stopped 02/15/22 1304)    ED Course/ Medical  Decision Making/ A&P Clinical Course as of 02/15/22 1527  Sun Feb 15, 2022  1206 Hgb 4.5, Rectal exam with dark tarry stools. Patient consented for blood transfusion. Consulting GI and will plan to admit to hospital [GL]  1227 Spoke with Dr. Tamala Julian for admission consideration. [GL]  1307 Spoke with Alen Blew with GI who will see patient [GL]    Clinical Course User Index [GL] Sherre Poot Adora Fridge, PA-C                           Medical Decision Making Amount and/or Complexity of Data Reviewed Labs: ordered. Radiology: ordered.  Risk Decision regarding hospitalization.    MDM  This is a 71 y.o. male who presents to the ED with generalized weakness, frequent falls, and concern for blood in stools The differential of this patient includes but is not limited to upper GI bleed, Lower GI bleed, Infection, Cardiac, electrolyte abnormality, Viral illness, hypo/hyperglycemia, traumatic brain injury  Initial Impression  Chronically ill appearing, no acute distress Vitals overall unremarkable No focal neuro deficit noted on exam. LE strength seems to be at baseline.  Initial broad workup initiated.   I personally ordered, reviewed, and interpreted all laboratory work and imaging and agree with radiologist interpretation. Results interpreted below:  Hgb 4.5 from baseline 13.1 just one month ago Creat 1.43 up from baseline, BUN 37 COVID/Flu negative Fecal Occult + UA not provided yet CT head normal CXR not acute abnormalities EKG nonischemic  Assessment/Plan:  Patient is presenting with generalized weakness, increasing falls, and concern for blood in stool over the past 2 weeks.  He is hemodynamically stable.  He is found to have a hemoglobin of 4.5 down from his baseline of 13.1 just 1 month ago.  He had frank tarry stools on my exam.  This is consistent with a acute GI bleed.  Started him on Protonix drip and bolus.  I have ordered initial 3 units of PRBC.  I spoke with Shelah Lewandowsky with gastroenterology who will see patient.  I also spoke with Dr. Tamala Julian from [facility] hospitalist who will see patient for admission.   Charting Requirements Additional history is obtained from:  Independent historian External Records from outside source obtained and reviewed including: Recent PCP note Social Determinants of Health:  none Pertinant PMH that complicates patient's illness: n/a  Patient Care Problems that were addressed during this visit: - GI bleed: Acute illness with systemic symptoms This patient was maintained on a cardiac monitor/telemetry. I personally viewed and interpreted the cardiac monitor which reveals an underlying rhythm of NSR Medications given in ED: Protonix, pRBC Reevaluation of the patient after these medicines showed that the patient stayed the same I have reviewed home medications and made changes accordingly.  Critical Care Interventions: severe anemia requiring blood transfusion Consultations: GI, [facility] Disposition: Admit  Portions of this note were generated with Dragon dictation software. Dictation errors may occur despite best attempts at proofreading.   Final Clinical Impression(s) / ED Diagnoses Final diagnoses:  Gastrointestinal hemorrhage, unspecified gastrointestinal hemorrhage type  Anemia, unspecified type    Rx / DC Orders ED Discharge Orders     None         Adolphus Birchwood, PA-C 02/15/22 1527    Lajean Saver, MD 02/16/22 1512

## 2022-02-15 NOTE — ED Notes (Signed)
ED TO INPATIENT HANDOFF REPORT  ED Nurse Name and Phone #: Mel Almond [phone]  S Name/Age/Gender Larry [name] 71 y.o. male Room/Bed: 037C/037C  Code Status   Code Status: Full Code  Home/SNF/Other Home Patient oriented to: self, place, and situation Is this baseline? Yes   Triage Complete: Triage complete  Chief Complaint Acute blood loss anemia [D62] Symptomatic anemia [D64.9]  Triage Note PT BIB [facility] from home c/o generalized weakness x1 week. Pt also states he has noticed some dark blood in his stool a week ago but has not had a bowel movement since. Tech noticed some bright red blood upon cleaning him up off the EMS stretcher. PT denies any pain at this time. Pt normally uses a wheelchair to get around and has been unable to get himself in the chair for the last week.    Allergies Allergies  Allergen Reactions   Rosuvastatin Other (See Comments)    Leg swelling    Level of Care/Admitting Diagnosis ED Disposition     ED Disposition  Admit   Condition  --   Comment  Hospital Area: [facility] [[id number]]  Level of Care: Progressive [102]  Admit to Progressive based on following criteria: GI, ENDOCRINE disease patients with GI bleeding, acute liver failure or pancreatitis, stable with diabetic ketoacidosis or thyrotoxicosis (hypothyroid) state.  May admit patient to [facility] or [facility] if equivalent level of care is available:: No  Covid Evaluation: Asymptomatic - no recent exposure (last 10 days) testing not required  Diagnosis: Symptomatic anemia [2841324]  Admitting Physician: Norval Morton [[id number]]  Attending Physician: Norval Morton [[id number]]  Certification:: I certify this patient will need inpatient services for at least 2 midnights          B Medical/Surgery History Past Medical History:  Diagnosis Date   Abnormality of gait    Acute delirium 12/11/2011   Acute respiratory failure due to COVID-19 ([place]) 09/23/2019   CTS  (carpal tunnel syndrome) 09/26/2015   Diabetes mellitus    Hypercholesterolemia    Hypertension    Kidney stones    Leukocytopenia 05/22/2013   MS (multiple sclerosis) ([place])    sees [facility]   T10 spinal cord injury ([place]) 12/12/2011   hx surgery 1996 with bilat gait abnormality since   Past Surgical History:  Procedure Laterality Date   BACK SURGERY     COLONOSCOPY     LUMBAR LAMINECTOMY       A IV Location/Drains/Wounds Patient Lines/Drains/Airways Status     Active Line/Drains/Airways     Name Placement date Placement time Site Days   Peripheral IV 02/15/22 20 G Right Antecubital 02/15/22  1043  Antecubital  less than 1   Peripheral IV 02/15/22 20 G Left;Posterior Forearm 02/15/22  1226  Forearm  less than 1            Intake/Output Last 24 hours  Intake/Output Summary (Last 24 hours) at 02/15/2022 1528 Last data filed at 02/15/2022 1503 Gross per 24 hour  Intake 315 ml  Output --  Net 315 ml    Labs/Imaging Results for orders placed or performed during the hospital encounter of 02/15/22 (from the past 48 hour(s))  Comprehensive metabolic panel     Status: Abnormal   Collection Time: 02/15/22 10:52 AM  Result Value Ref Range   Sodium 143 135 - 145 mmol/L   Potassium 3.8 3.5 - 5.1 mmol/L   Chloride 109 98 - 111 mmol/L   CO2 25 22 -  32 mmol/L   Glucose, Bld 182 (H) 70 - 99 mg/dL    Comment: Glucose reference range applies only to samples taken after fasting for at least 8 hours.   BUN 37 (H) 8 - 23 mg/dL   Creatinine, Ser 1.43 (H) 0.61 - 1.24 mg/dL   Calcium 9.0 8.9 - 10.3 mg/dL   Total Protein 5.8 (L) 6.5 - 8.1 g/dL   Albumin 3.6 3.5 - 5.0 g/dL   AST 26 15 - 41 U/L   ALT 18 0 - 44 U/L   Alkaline Phosphatase 55 38 - 126 U/L   Total Bilirubin 0.3 0.3 - 1.2 mg/dL   GFR, Estimated 52 (L) >60 mL/min    Comment: (NOTE) Calculated using the CKD-EPI Creatinine Equation (2021)    Anion gap 9 5 - 15    Comment: Performed at [facility] [address] [place], [place] [address]  CBC with Differential     Status: Abnormal   Collection Time: 02/15/22 10:52 AM  Result Value Ref Range   WBC 4.9 4.0 - 10.5 K/uL   RBC 1.47 (L) 4.22 - 5.81 MIL/uL   Hemoglobin 4.5 (LL) 13.0 - 17.0 g/dL    Comment: REPEATED TO VERIFY THIS CRITICAL RESULT HAS VERIFIED AND BEEN CALLED TO BRITTANY BECK RN BY MELISSA BOOSTEDT ON 09 17 2023 AT 1139, AND HAS BEEN READ BACK.     HCT 15.4 (L) 39.0 - 52.0 %   MCV 104.8 (H) 80.0 - 100.0 fL   MCH 30.6 26.0 - 34.0 pg   MCHC 29.2 (L) 30.0 - 36.0 g/dL   RDW 16.0 (H) 11.5 - 15.5 %   Platelets 306 150 - 400 K/uL   nRBC 1.0 (H) 0.0 - 0.2 %   Neutrophils Relative % 79 %   Neutro Abs 3.9 1.7 - 7.7 K/uL   Lymphocytes Relative 14 %   Lymphs Abs 0.7 0.7 - 4.0 K/uL   Monocytes Relative 4 %   Monocytes Absolute 0.2 0.1 - 1.0 K/uL   Eosinophils Relative 2 %   Eosinophils Absolute 0.1 0.0 - 0.5 K/uL   Basophils Relative 0 %   Basophils Absolute 0.0 0.0 - 0.1 K/uL   Immature Granulocytes 1 %   Abs Immature Granulocytes 0.03 0.00 - 0.07 K/uL    Comment: Performed at [facility] [address] [place], [place] [address]  Type and screen [place]     Status: None (Preliminary result)   Collection Time: 02/15/22 10:52 AM  Result Value Ref Range   ABO/RH(D) B POS    Antibody Screen NEG    Sample Expiration 02/18/2022,2359    Unit Number [id number]    Blood Component Type RED CELLS,LR    Unit division 00    Status of Unit ALLOCATED    Transfusion Status OK TO TRANSFUSE    Crossmatch Result Compatible    Unit Number [id number]    Blood Component Type RED CELLS,LR    Unit division 00    Status of Unit ISSUED    Transfusion Status OK TO TRANSFUSE    Crossmatch Result      Compatible Performed at [facility], [place] [address], [place], [place] [address]    Unit Number [id number]    Blood Component Type RED CELLS,LR    Unit division 00    Status of Unit ALLOCATED     Transfusion Status OK TO TRANSFUSE    Crossmatch Result Compatible   Resp  Panel by RT-PCR (Flu A&B, Covid) Anterior Nasal Swab     Status: None   Collection Time: 02/15/22 11:13 AM   Specimen: Anterior Nasal Swab  Result Value Ref Range   SARS Coronavirus 2 by RT PCR NEGATIVE NEGATIVE    Comment: (NOTE) SARS-CoV-2 target nucleic acids are NOT DETECTED.  The SARS-CoV-2 RNA is generally detectable in upper respiratory specimens during the acute phase of infection. The lowest concentration of SARS-CoV-2 viral copies this assay can detect is 138 copies/mL. A negative result does not preclude SARS-Cov-2 infection and should not be used as the sole basis for treatment or other patient management decisions. A negative result may occur with  improper specimen collection/handling, submission of specimen other than nasopharyngeal swab, presence of viral mutation(s) within the areas targeted by this assay, and inadequate number of viral copies(<138 copies/mL). A negative result must be combined with clinical observations, patient history, and epidemiological information. The expected result is Negative.  Fact Sheet for Patients:  EntrepreneurPulse.com.au  Fact Sheet for Healthcare Providers:  IncredibleEmployment.be  This test is no t yet approved or cleared by the [place] FDA and  has been authorized for detection and/or diagnosis of SARS-CoV-2 by FDA under an Emergency Use Authorization (EUA). This EUA will remain  in effect (meaning this test can be used) for the duration of the COVID-19 declaration under Section 564(b)(1) of the Act, 21 U.S.C.section 360bbb-3(b)(1), unless the authorization is terminated  or revoked sooner.       Influenza A by PCR NEGATIVE NEGATIVE   Influenza B by PCR NEGATIVE NEGATIVE    Comment: (NOTE) The Xpert Xpress SARS-CoV-2/FLU/RSV plus assay is intended as an aid in the diagnosis of influenza from Nasopharyngeal  swab specimens and should not be used as a sole basis for treatment. Nasal washings and aspirates are unacceptable for Xpert Xpress SARS-CoV-2/FLU/RSV testing.  Fact Sheet for Patients: EntrepreneurPulse.com.au  Fact Sheet for Healthcare Providers: IncredibleEmployment.be  This test is not yet approved or cleared by the [place] FDA and has been authorized for detection and/or diagnosis of SARS-CoV-2 by FDA under an Emergency Use Authorization (EUA). This EUA will remain in effect (meaning this test can be used) for the duration of the COVID-19 declaration under Section 564(b)(1) of the Act, 21 U.S.C. section 360bbb-3(b)(1), unless the authorization is terminated or revoked.  Performed at [facility], [place] [address] [place],  [address]   POC CBG, ED     Status: Abnormal   Collection Time: 02/15/22 11:56 AM  Result Value Ref Range   Glucose-Capillary 167 (H) 70 - 99 mg/dL    Comment: Glucose reference range applies only to samples taken after fasting for at least 8 hours.  POC occult blood, ED RN will collect     Status: Abnormal   Collection Time: 02/15/22 12:13 PM  Result Value Ref Range   Fecal Occult Bld POSITIVE (A) NEGATIVE   CT Head Wo Contrast  Result Date: 02/15/2022 CLINICAL DATA:  Neuro deficit.  Stroke suspected. EXAM: CT HEAD WITHOUT CONTRAST TECHNIQUE: Contiguous axial images were obtained from the base of the skull through the vertex without intravenous contrast. RADIATION DOSE REDUCTION: This exam was performed according to the departmental dose-optimization program which includes automated exposure control, adjustment of the mA and/or kV according to patient size and/or use of iterative reconstruction technique. COMPARISON:  Brain MRI 07/01/2018 FINDINGS: Brain: No evidence of acute infarction, hemorrhage, hydrocephalus, extra-axial collection or mass lesion/mass effect. There is mild patchy low-attenuation  within the subcortical and periventricular white matter compatible with chronic microvascular disease. Vascular: No hyperdense vessel or unexpected calcification. Skull: Normal. Negative for fracture or focal lesion. Sinuses/Orbits: No acute finding. Other: None. IMPRESSION: 1. No acute intracranial abnormalities. 2. Chronic small vessel ischemic disease. Electronically Signed   By: Kerby Moors M.D.   On: 02/15/2022 12:02   DG Chest Portable 1 View  Result Date: 02/15/2022 CLINICAL DATA:  Weakness. EXAM: PORTABLE CHEST 1 VIEW COMPARISON:  CT 05/23/2020 FINDINGS: There is mild cardiac enlargement. No pleural effusion or edema. No airspace opacities identified. Scarring noted in the left midlung. IMPRESSION: No acute cardiopulmonary abnormalities. Electronically Signed   By: Kerby Moors M.D.   On: 02/15/2022 11:57    Pending Labs Unresulted Labs (From admission, onward)     Start     Ordered   02/16/22 0500  CBC  Tomorrow morning,   R        02/15/22 1242   02/15/22 1700  Hemoglobin and hematocrit, blood  Once-Timed,   TIMED        02/15/22 1242   02/15/22 1150  Prepare RBC (crossmatch)  (Adult Blood Administration - Red Blood Cells)  Once,   R       Question Answer Comment  # of Units 3 units   Transfusion Indications Actively Bleeding / GI Bleed   Number of Units to Keep Ahead 2 units ahead   Keep ahead indications Active Bleeding/GI Bleed   Instructions: Transfuse   If emergent release call blood bank Not emergent release      02/15/22 1154   02/15/22 1113  Urinalysis, Routine w reflex microscopic Urine, Clean Catch  Once,   URGENT        02/15/22 1115            Vitals/Pain Today's Vitals   02/15/22 1330 02/15/22 1415 02/15/22 1430 02/15/22 1502  BP: 126/60 (!) 143/83 (!) 147/71 (!) 137/91  Pulse: 72 95 69 66  Resp: 18 (!) '24 15 19  '$ Temp:    97.7 F (36.5 C)  TempSrc:    Oral  SpO2: 100% 100% 100%   Weight:      Height:      PainSc:        Isolation  Precautions No active isolations  Medications Medications  0.9 %  sodium chloride infusion (Manually program via Guardrails IV Fluids) (0 mLs Intravenous Hold 02/15/22 1227)  pantoprozole (PROTONIX) 80 mg /NS 100 mL infusion (8 mg/hr Intravenous New Bag/Given 02/15/22 1238)  sodium chloride flush (NS) 0.9 % injection 3 mL (has no administration in time range)  acetaminophen (TYLENOL) tablet 650 mg (has no administration in time range)    Or  acetaminophen (TYLENOL) suppository 650 mg (has no administration in time range)  ondansetron (ZOFRAN) tablet 4 mg (has no administration in time range)    Or  ondansetron (ZOFRAN) injection 4 mg (has no administration in time range)  albuterol (PROVENTIL) (2.5 MG/3ML) 0.083% nebulizer solution 2.5 mg (has no administration in time range)  atorvastatin (LIPITOR) tablet 20 mg (has no administration in time range)  traZODone (DESYREL) tablet 50 mg (has no administration in time range)  finasteride (PROSCAR) tablet 5 mg (has no administration in time range)  mirabegron ER (MYRBETRIQ) tablet 25 mg (has no administration in time range)  sodium chloride 0.9 % bolus 1,000 mL (1,000 mLs Intravenous New Bag/Given 02/15/22 1225)  pantoprazole (PROTONIX) 80 mg /NS 100 mL IVPB (0 mg Intravenous Stopped 02/15/22 1304)  Mobility manual wheelchair Low fall risk   Focused Assessments    R Recommendations: See Admitting Provider Note  Report given to:   Additional Notes: EGD in the AM

## 2022-02-16 ENCOUNTER — Encounter (HOSPITAL_COMMUNITY)
Admission: EM | Disposition: A | Discharge status: Skilled Nursing Facility | Payer: Self-pay | Source: Home / Self Care | Attending: Family Medicine

## 2022-02-16 ENCOUNTER — Inpatient Hospital Stay (HOSPITAL_COMMUNITY): Payer: Medicare Other | Admitting: Certified Registered Nurse Anesthetist

## 2022-02-16 ENCOUNTER — Telehealth: Payer: Self-pay

## 2022-02-16 ENCOUNTER — Encounter (HOSPITAL_COMMUNITY): Payer: Self-pay | Admitting: Internal Medicine

## 2022-02-16 DIAGNOSIS — K259 Gastric ulcer, unspecified as acute or chronic, without hemorrhage or perforation: Secondary | ICD-10-CM

## 2022-02-16 DIAGNOSIS — I10 Essential (primary) hypertension: Secondary | ICD-10-CM | POA: Diagnosis not present

## 2022-02-16 DIAGNOSIS — L8990 Pressure ulcer of unspecified site, unspecified stage: Secondary | ICD-10-CM | POA: Insufficient documentation

## 2022-02-16 DIAGNOSIS — K274 Chronic or unspecified peptic ulcer, site unspecified, with hemorrhage: Secondary | ICD-10-CM | POA: Diagnosis not present

## 2022-02-16 DIAGNOSIS — K250 Acute gastric ulcer with hemorrhage: Secondary | ICD-10-CM

## 2022-02-16 DIAGNOSIS — K254 Chronic or unspecified gastric ulcer with hemorrhage: Secondary | ICD-10-CM

## 2022-02-16 DIAGNOSIS — E1151 Type 2 diabetes mellitus with diabetic peripheral angiopathy without gangrene: Secondary | ICD-10-CM | POA: Diagnosis not present

## 2022-02-16 DIAGNOSIS — Z7984 Long term (current) use of oral hypoglycemic drugs: Secondary | ICD-10-CM

## 2022-02-16 DIAGNOSIS — D509 Iron deficiency anemia, unspecified: Secondary | ICD-10-CM | POA: Diagnosis not present

## 2022-02-16 DIAGNOSIS — D649 Anemia, unspecified: Secondary | ICD-10-CM | POA: Diagnosis not present

## 2022-02-16 DIAGNOSIS — K269 Duodenal ulcer, unspecified as acute or chronic, without hemorrhage or perforation: Secondary | ICD-10-CM

## 2022-02-16 DIAGNOSIS — K279 Peptic ulcer, site unspecified, unspecified as acute or chronic, without hemorrhage or perforation: Secondary | ICD-10-CM | POA: Diagnosis not present

## 2022-02-16 HISTORY — PX: BIOPSY: SHX5522

## 2022-02-16 HISTORY — PX: ESOPHAGOGASTRODUODENOSCOPY (EGD) WITH PROPOFOL: SHX5813

## 2022-02-16 LAB — CBC
HCT: 24.5 % — ABNORMAL LOW (ref 39.0–52.0)
HCT: 27.3 % — ABNORMAL LOW (ref 39.0–52.0)
HCT: 28.8 % — ABNORMAL LOW (ref 39.0–52.0)
Hemoglobin: 8.6 g/dL — ABNORMAL LOW (ref 13.0–17.0)
Hemoglobin: 9.1 g/dL — ABNORMAL LOW (ref 13.0–17.0)
Hemoglobin: 9.9 g/dL — ABNORMAL LOW (ref 13.0–17.0)
MCH: 31.9 pg (ref 26.0–34.0)
MCH: 31.2 pg (ref 26.0–34.0)
MCH: 31.4 pg (ref 26.0–34.0)
MCHC: 35.1 g/dL (ref 30.0–36.0)
MCHC: 33.3 g/dL (ref 30.0–36.0)
MCHC: 34.4 g/dL (ref 30.0–36.0)
MCV: 90.7 fL (ref 80.0–100.0)
MCV: 93.5 fL (ref 80.0–100.0)
MCV: 91.4 fL (ref 80.0–100.0)
Platelets: 271 10*3/uL (ref 150–400)
Platelets: 278 10*3/uL (ref 150–400)
Platelets: 300 10*3/uL (ref 150–400)
RBC: 2.7 MIL/uL — ABNORMAL LOW (ref 4.22–5.81)
RBC: 2.92 MIL/uL — ABNORMAL LOW (ref 4.22–5.81)
RBC: 3.15 MIL/uL — ABNORMAL LOW (ref 4.22–5.81)
RDW: 15.7 % — ABNORMAL HIGH (ref 11.5–15.5)
RDW: 16.1 % — ABNORMAL HIGH (ref 11.5–15.5)
RDW: 16.2 % — ABNORMAL HIGH (ref 11.5–15.5)
WBC: 5.7 10*3/uL (ref 4.0–10.5)
WBC: 5.7 10*3/uL (ref 4.0–10.5)
WBC: 7 10*3/uL (ref 4.0–10.5)
nRBC: 1.4 % — ABNORMAL HIGH (ref 0.0–0.2)
nRBC: 0.9 % — ABNORMAL HIGH (ref 0.0–0.2)
nRBC: 0.6 % — ABNORMAL HIGH (ref 0.0–0.2)

## 2022-02-16 LAB — TYPE AND SCREEN
ABO/RH(D): B POS
Antibody Screen: NEGATIVE
Unit division: 0
Unit division: 0
Unit division: 0

## 2022-02-16 LAB — BPAM RBC
Blood Product Expiration Date: 202309262359
Blood Product Expiration Date: 202309292359
Blood Product Expiration Date: 202310042359
ISSUE DATE / TIME: 202309171259
ISSUE DATE / TIME: 202309171628
ISSUE DATE / TIME: 202309172127
Unit Type and Rh: 7300
Unit Type and Rh: 7300
Unit Type and Rh: 7300

## 2022-02-16 LAB — GLUCOSE, CAPILLARY
Glucose-Capillary: 110 mg/dL — ABNORMAL HIGH (ref 70–99)
Glucose-Capillary: 111 mg/dL — ABNORMAL HIGH (ref 70–99)
Glucose-Capillary: 117 mg/dL — ABNORMAL HIGH (ref 70–99)
Glucose-Capillary: 151 mg/dL — ABNORMAL HIGH (ref 70–99)
Glucose-Capillary: 128 mg/dL — ABNORMAL HIGH (ref 70–99)

## 2022-02-16 LAB — BASIC METABOLIC PANEL
Anion gap: 9 (ref 5–15)
BUN: 18 mg/dL (ref 8–23)
CO2: 24 mmol/L (ref 22–32)
Calcium: 9.2 mg/dL (ref 8.9–10.3)
Chloride: 107 mmol/L (ref 98–111)
Creatinine, Ser: 1.1 mg/dL (ref 0.61–1.24)
GFR, Estimated: >60 mL/min (ref >60)
Glucose, Bld: 145 mg/dL — ABNORMAL HIGH (ref 70–99)
Potassium: 4 mmol/L (ref 3.5–5.1)
Sodium: 140 mmol/L (ref 135–145)

## 2022-02-16 LAB — PREPARE RBC (CROSSMATCH)

## 2022-02-16 SURGERY — ESOPHAGOGASTRODUODENOSCOPY (EGD) WITH PROPOFOL
Anesthesia: Monitor Anesthesia Care

## 2022-02-16 MED ORDER — PANTOPRAZOLE SODIUM 40 MG PO TBEC
40.0000 mg | DELAYED_RELEASE_TABLET | Freq: Two times a day (BID) | ORAL | Status: DC
  Administered 2022-02-16 – 2022-02-18 (×4): 40 mg via ORAL
  Filled 2022-02-16 (×4): qty 1

## 2022-02-16 MED ORDER — LACTATED RINGERS IV SOLN: INTRAVENOUS | Status: DC | PRN

## 2022-02-16 MED ORDER — INSULIN ASPART 100 UNIT/ML IJ SOLN: 0.0000 [IU] | Freq: Every day | INTRAMUSCULAR | Status: DC

## 2022-02-16 MED ORDER — PROPOFOL 10 MG/ML IV BOLUS
INTRAVENOUS | Status: DC | PRN
  Administered 2022-02-16 (×2): 10 mg via INTRAVENOUS

## 2022-02-16 MED ORDER — PROPOFOL 500 MG/50ML IV EMUL
INTRAVENOUS | Status: DC | PRN
  Administered 2022-02-16: 100 ug/kg/min via INTRAVENOUS

## 2022-02-16 MED ORDER — INSULIN ASPART 100 UNIT/ML IJ SOLN
0.0000 [IU] | Freq: Three times a day (TID) | INTRAMUSCULAR | Status: DC
  Administered 2022-02-16: 3 [IU] via SUBCUTANEOUS
  Administered 2022-02-17: 2 [IU] via SUBCUTANEOUS
  Administered 2022-02-17: 3 [IU] via SUBCUTANEOUS
  Administered 2022-02-17 – 2022-02-18 (×3): 2 [IU] via SUBCUTANEOUS

## 2022-02-16 SURGICAL SUPPLY — 15 items

## 2022-02-16 NOTE — Telephone Encounter (Signed)
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 Hb dropped from 13.1 to 4.5 upon arrival today. EGD showed multiple small gastric and a duodenal bulb ulcer.  Recommending repeat EGD with Dr. Henrene Pastor in 3 months. Office visit with Dr. Henrene Pastor or an APP in 3-4 weeks.  Thanks.  KLB

## 2022-02-16 NOTE — Telephone Encounter (Signed)
Patient has been scheduled for OV on 03/11/22 at 9:15 am with [place], [place]. EGD recall placed since schedule is not currently out that far. Appointment date/time will appear on discharge AVS.

## 2022-02-16 NOTE — Evaluation (Signed)
Physical Therapy Evaluation Patient Details Name: Larry [name] MRN: [id number] DOB: 14-Mar-1951 Today's Date: 02/16/2022  History of Present Illness  Pt is a 71 y.o. M who presents 02/15/2022 with progressively worsening weakness over last 2 weeks. Admitted with symptomatic anemia secondary to GIB/nonbleeding gastric and duodenal ulcer. Significant PMH: HTN, HLD, DM2, MS, SCI.  Clinical Impression  PTA, pt lives alone, is modI with low pivot transfers, and ADL's. Pt presents with decreased functional mobility secondary to acute changes in sensation in bilateral hands, BLE's, weakness, impaired sitting balance and decreased activity tolerance. Pt requiring two person min-mod assist for bed mobility and transfers. Pt performing lateral scoot transfer to and from wheelchair with and without slide board. This is a gross change from his functional baseline. Suspect good progress based on motivation, PLOF, and family support. Recommend post acute rehab to address deficits and maximize functional mobility.     Recommendations for follow up therapy are one component of a multi-disciplinary discharge planning process, led by the attending physician.  Recommendations may be updated based on patient status, additional functional criteria and insurance authorization.  Follow Up Recommendations Acute inpatient rehab (3hours/day) Can patient physically be transported by private vehicle: No    Assistance Recommended at Discharge Intermittent Supervision/Assistance  Patient can return home with the following  A lot of help with walking and/or transfers;A lot of help with bathing/dressing/bathroom    Equipment Recommendations Other (comment) (slide board)  Recommendations for Other Services  Rehab consult    Functional Status Assessment Patient has had a recent decline in their functional status and demonstrates the ability to make significant improvements in function in a reasonable and predictable amount  of time.     Precautions / Restrictions Precautions Precautions: Fall Restrictions Weight Bearing Restrictions: No      Mobility  Bed Mobility Overal bed mobility: Needs Assistance Bed Mobility: Supine to Sit, Sit to Supine     Supine to sit: Min assist, +2 for safety/equipment Sit to supine: Mod assist, +2 for safety/equipment   General bed mobility comments: Pt progressing to long sitting with single UE support, using BUE's to work legs off edge of bed, ultimately requiring assist with LLE and use of bed pad to scoot hips out to edge. Assist for BLE's back into bed    Transfers Overall transfer level: Needs assistance Equipment used: None, Sliding board Transfers: Bed to chair/wheelchair/BSC            Lateral/Scoot Transfers: Min assist, Mod assist, +2 physical assistance, With slide board General transfer comment: Pt requiring modA + 2 for lateral scoot transfer to w/c towards left. Cues for hand placement. MinA + 2 for slide board transfer towards right from w/c > bed. TotalA for placement of slide board. Cues for technique    Ambulation/Gait               General Gait Details: unable at baseline  Stairs            Wheelchair Mobility    Modified Rankin (Stroke Patients Only)       Balance Overall balance assessment: Needs assistance Sitting-balance support: Feet supported Sitting balance-Leahy Scale: Poor Sitting balance - Comments: reliant on BUE support, close supervision                                     Pertinent Vitals/Pain Pain Assessment Pain Assessment: No/denies pain    Home  Living Family/patient expects to be discharged to:: Private residence Living Arrangements: Alone Available Help at Discharge: Family Type of Home: House Home Access: Level entry       [place]: One [place]: Wheelchair - manual;Shower seat;Grab bars - tub/shower;Grab bars - toilet      Prior Function Prior Level of  Function : Independent/Modified Independent             Mobility Comments: low pivot transfers to and from w/c ADLs Comments: independent ADL's. Meals on Wheels 5 days/wk     Hand Dominance   Dominant Hand: Right    Extremity/Trunk Assessment   Upper Extremity Assessment Upper Extremity Assessment: Defer to OT evaluation    Lower Extremity Assessment Lower Extremity Assessment: RLE deficits/detail;LLE deficits/detail RLE Deficits / Details: No active movement noted; pt reports decreased sensation, but overall intact to light touch. ROM WFL. LLE Deficits / Details: No active movement noted; pt reports decreased sensation, but overall intact to light touch. ROM WFL.       Communication   Communication: No difficulties  Cognition Arousal/Alertness: Awake/alert Behavior During Therapy: WFL for tasks assessed/performed Overall Cognitive Status: Within Functional Limits for tasks assessed                                          General Comments      Exercises     Assessment/Plan    PT Assessment Patient needs continued PT services  PT Problem List Decreased strength;Decreased activity tolerance;Decreased balance;Decreased mobility       PT Treatment Interventions DME instruction;Functional mobility training;Therapeutic exercise;Therapeutic activities;Balance training;Neuromuscular re-education;Patient/family education;Wheelchair mobility training    PT Goals (Current goals can be found in the Care Plan section)  Acute Rehab PT Goals Patient Stated Goal: improve strength PT Goal Formulation: With patient Time For Goal Achievement: 03/02/22 Potential to Achieve Goals: Good    Frequency Min 3X/week     Co-evaluation PT/OT/SLP Co-Evaluation/Treatment: Yes Reason for Co-Treatment: For patient/therapist safety;To address functional/ADL transfers PT goals addressed during session: Mobility/safety with mobility         AM-PAC PT "6 Clicks"  Mobility  Outcome Measure Help needed turning from your back to your side while in a flat bed without using bedrails?: A Little Help needed moving from lying on your back to sitting on the side of a flat bed without using bedrails?: A Little Help needed moving to and from a bed to a chair (including a wheelchair)?: A Lot Help needed standing up from a chair using your arms (e.g., wheelchair or bedside chair)?: Total Help needed to walk in hospital room?: Total Help needed climbing 3-5 steps with a railing? : Total 6 Click Score: 11    End of Session Equipment Utilized During Treatment: Gait belt Activity Tolerance: Patient tolerated treatment well Patient left: in bed;with call bell/phone within reach;with family/visitor present Nurse Communication: Mobility status PT Visit Diagnosis: Other abnormalities of gait and mobility (R26.89);History of falling (Z91.81)    Time: 5329-9242 PT Time Calculation (min) (ACUTE ONLY): 38 min   Charges:   PT Evaluation $PT Eval Moderate Complexity: 1 Mod PT Treatments $Therapeutic Activity: 8-22 mins        Wyona Almas, PT, DPT Acute Rehabilitation Services Office [phone]   Deno Etienne 02/16/2022, 3:33 PM

## 2022-02-16 NOTE — Anesthesia Preprocedure Evaluation (Signed)
Anesthesia Evaluation  Patient identified by MRN, date of birth, ID band Patient awake    Reviewed: Allergy & Precautions, NPO status , Patient's Chart, lab work & pertinent test results  Airway Mallampati: II  TM Distance: >3 FB Neck ROM: Full    Dental no notable dental hx.    Pulmonary neg pulmonary ROS,    Pulmonary exam normal        Cardiovascular hypertension, + Peripheral Vascular Disease   Rhythm:Regular Rate:Normal     Neuro/Psych MS  Neuromuscular disease negative psych ROS   GI/Hepatic Neg liver ROS, GERD  Medicated,GIB   Endo/Other  diabetes, Type 2, Oral Hypoglycemic Agents  Renal/GU Renal disease  negative genitourinary   Musculoskeletal SCI wheelchair bound    Abdominal Normal abdominal exam  (+)   Peds  Hematology  (+) Blood dyscrasia, anemia , Lab Results      Component                Value               Date                      WBC                      5.7                 02/16/2022                HGB                      8.6 (L)             02/16/2022                HCT                      24.5 (L)            02/16/2022                MCV                      90.7                02/16/2022                PLT                      271                 02/16/2022           Lab Results      Component                Value               Date                      NA                       143                 02/15/2022                K  3.8                 02/15/2022                CO2                      25                  02/15/2022                GLUCOSE                  182 (H)             02/15/2022                BUN                      37 (H)              02/15/2022                CREATININE               1.43 (H)            02/15/2022                CALCIUM                  9.0                 02/15/2022                GFRNONAA                 52 (L)               02/15/2022             Anesthesia Other Findings   Reproductive/Obstetrics                            Anesthesia Physical Anesthesia Plan  ASA: 3  Anesthesia Plan: MAC   Post-op Pain Management:    Induction: Intravenous  PONV Risk Score and Plan: 1 and Propofol infusion and Treatment may vary due to age or medical condition  Airway Management Planned: Simple Face Mask, Natural Airway and Nasal Cannula  Additional Equipment: None  Intra-op Plan:   Post-operative Plan:   Informed Consent: I have reviewed the patients History and Physical, chart, labs and discussed the procedure including the risks, benefits and alternatives for the proposed anesthesia with the patient or authorized representative who has indicated his/her understanding and acceptance.     Dental advisory given  Plan Discussed with:   Anesthesia Plan Comments: (Lab Results      Component                Value               Date                      WBC                      5.7                 02/16/2022  HGB                      8.6 (L)             02/16/2022                HCT                      24.5 (L)            02/16/2022                MCV                      90.7                02/16/2022                PLT                      271                 02/16/2022           Lab Results      Component                Value               Date                      NA                       143                 02/15/2022                K                        3.8                 02/15/2022                CO2                      25                  02/15/2022                GLUCOSE                  182 (H)             02/15/2022                BUN                      37 (H)              02/15/2022                CREATININE               1.43 (H)            02/15/2022                CALCIUM                  9.0  02/15/2022                GFRNONAA                  52 (L)              02/15/2022           )        Anesthesia Quick Evaluation

## 2022-02-16 NOTE — Progress Notes (Signed)
Inpatient Rehab Admissions Coordinator:   Per therapy recommendations, patient was screened for CIR candidacy by Alycen Mack, MS, CCC-SLP. At this time, Pt. does not appear to demonstrate medical necessity to justify in hospital rehabilitation/CIR. will not pursue a rehab consult for this Pt.   Recommend other rehab venues to be pursued.  Please contact me with any questions.  Kolbee Stallman, MS, CCC-SLP Rehab Admissions Coordinator  [phone] (celll) [phone] (office)   

## 2022-02-16 NOTE — Anesthesia Procedure Notes (Signed)
Procedure Name: MAC Date/Time: 02/16/2022 9:20 AM  Performed by: Harden Mo, CRNAPre-anesthesia Checklist: Patient identified, Emergency Drugs available, Suction available and Patient being monitored Patient Re-evaluated:Patient Re-evaluated prior to induction Oxygen Delivery Method: Nasal cannula Preoxygenation: Pre-oxygenation with 100% oxygen Induction Type: IV induction Placement Confirmation: positive ETCO2 and breath sounds checked- equal and bilateral Dental Injury: Teeth and Oropharynx as per pre-operative assessment

## 2022-02-16 NOTE — Evaluation (Signed)
Occupational Therapy Evaluation Patient Details Name: Larry [name] MRN: [id number] DOB: 02-Apr-1951 Today's Date: 02/16/2022   History of Present Illness Pt is a 71 y.o. M who presents 02/15/2022 with progressively worsening weakness over last 2 weeks. Admitted with symptomatic anemia secondary to GIB/nonbleeding gastric and duodenal ulcer. Significant PMH: HTN, HLD, DM2, MS, SCI.   Clinical Impression   Larry [name] was evaluated s/p the above admission list, he is typically mod I at baseline with use of WC for mobility, DME for ADLs and completes low squat pivot transfers. Upon evaluation pt had functional limitations due to decreased sensation in BLEs and distal BUEs, generalized weakness, poor fine motor coordination, decreased ROM in BUEs and decreased activity tolerance. Overall he required min-mod A for bed mobility and mod A for tranfers. Due to deficits, he also requires min A fro UB ADLs and up to max A for LB ADLs. Red built up foam provided to assist with indep self feeding. OT to continue to follow acutely. Recommend d/c to AIR for maximal functional recovery.      Recommendations for follow up therapy are one component of a multi-disciplinary discharge planning process, led by the attending physician.  Recommendations may be updated based on patient status, additional functional criteria and insurance authorization.   Follow Up Recommendations  Acute inpatient rehab (3hours/day)    Assistance Recommended at Discharge Frequent or constant Supervision/Assistance  Patient can return home with the following A lot of help with walking and/or transfers;A lot of help with bathing/dressing/bathroom;Assistance with cooking/housework;Assistance with feeding;Assist for transportation;Help with stairs or ramp for entrance    Functional Status Assessment  Patient has had a recent decline in their functional status and demonstrates the ability to make significant improvements in function in a  reasonable and predictable amount of time.  Equipment Recommendations  None recommended by OT (pt is well equipped)    Recommendations for Other Services Rehab consult     Precautions / Restrictions Precautions Precautions: Fall Restrictions Weight Bearing Restrictions: No      Mobility Bed Mobility Overal bed mobility: Needs Assistance Bed Mobility: Supine to Sit, Sit to Supine     Supine to sit: Min assist, +2 for safety/equipment Sit to supine: Mod assist, +2 for safety/equipment   General bed mobility comments: Pt progressing to long sitting with single UE support, using BUE's to work legs off edge of bed, ultimately requiring assist with LLE and use of bed pad to scoot hips out to edge. Assist for BLE's back into bed    Transfers Overall transfer level: Needs assistance Equipment used: None, Sliding board Transfers: Bed to chair/wheelchair/BSC            Lateral/Scoot Transfers: Min assist, Mod assist, +2 physical assistance, With slide board General transfer comment: Pt requiring modA + 2 for lateral scoot transfer to w/c towards left. Cues for hand placement. MinA + 2 for slide board transfer towards right from w/c > bed. TotalA for placement of slide board. Cues for technique      Balance Overall balance assessment: Needs assistance Sitting-balance support: Feet supported Sitting balance-Leahy Scale: Poor Sitting balance - Comments: reliant on BUE support, close supervision                                   ADL either performed or assessed with clinical judgement   ADL Overall ADL's : Needs assistance/impaired Eating/Feeding: Minimal assistance;Sitting Eating/Feeding Details (indicate cue type  and reason): supported in sitting, +built up handles Grooming: Minimal assistance;Sitting   Upper Body Bathing: Minimal assistance;Sitting   Lower Body Bathing: Moderate assistance;Sitting/lateral leans   Upper Body Dressing : Minimal  assistance;Sitting   Lower Body Dressing: Maximal assistance;Sitting/lateral leans   Toilet Transfer: Squat-pivot;Transfer board;Moderate assistance Toilet Transfer Details (indicate cue type and reason): squat pivot to WC, slideboard back to bed Toileting- Clothing Manipulation and Hygiene: Maximal assistance;Sitting/lateral lean       Functional mobility during ADLs: Moderate assistance;+2 for physical assistance;+2 for safety/equipment General ADL Comments: limited by BUE weakness, FM deficits, BLE impairments     Vision Baseline Vision/History: 1 Wears glasses Vision Assessment?: No apparent visual deficits            Pertinent Vitals/Pain Pain Assessment Pain Assessment: No/denies pain     Hand Dominance Right   Extremity/Trunk Assessment Upper Extremity Assessment Upper Extremity Assessment: RUE deficits/detail;LUE deficits/detail RUE Deficits / Details: Shoulder, wrist and elbow AROM is WFL. Decreased FM coordination. poor grip strength. Pt reports "numbness" but able to feel accurately with eyes closed RUE Sensation: decreased light touch RUE Coordination: decreased fine motor LUE Deficits / Details: Shoulder, wrist and elbow AROM is WFL. Decreased FM coordination. poor grip strength. Pt reports "numbness" but able to feel accurately with eyes closed LUE Sensation: decreased light touch LUE Coordination: decreased fine motor;decreased gross motor   Lower Extremity Assessment Lower Extremity Assessment: Defer to PT evaluation RLE Deficits / Details: No active movement noted; pt reports decreased sensation, but overall intact to light touch. ROM WFL. LLE Deficits / Details: No active movement noted; pt reports decreased sensation, but overall intact to light touch. ROM WFL.   Cervical / Trunk Assessment Cervical / Trunk Assessment: Normal   Communication Communication Communication: No difficulties   Cognition Arousal/Alertness: Awake/alert Behavior During  Therapy: WFL for tasks assessed/performed Overall Cognitive Status: Within Functional Limits for tasks assessed                   General Comments  VSS on RA, family present            Home Living Family/patient expects to be discharged to:: Private residence Living Arrangements: Alone Available Help at Discharge: Family Type of Home: House Home Access: Level entry     Home Layout: One level     Bathroom Shower/Tub: Occupational psychologist: Standard Bathroom Accessibility: Yes How Accessible: Accessible via wheelchair;Accessible via walker [place]: Wheelchair - manual;Shower seat;Grab bars - tub/shower;Grab bars - toilet          Prior Functioning/Environment Prior Level of Function : Independent/Modified Independent             Mobility Comments: low pivot transfers to and from w/c ADLs Comments: independent ADL's. Meals on Wheels 5 days/wk        OT Problem List: Decreased strength;Decreased range of motion;Decreased activity tolerance;Impaired balance (sitting and/or standing);Decreased safety awareness;Decreased knowledge of use of DME or AE;Decreased knowledge of precautions;Impaired sensation;Impaired UE functional use      OT Treatment/Interventions: Self-care/ADL training;Therapeutic exercise;DME and/or AE instruction;Therapeutic activities;Patient/family education;Balance training    OT Goals(Current goals can be found in the care plan section) Acute Rehab OT Goals Patient Stated Goal: to get stronger OT Goal Formulation: With patient Time For Goal Achievement: 03/02/22 Potential to Achieve Goals: Good ADL Goals Pt Will Perform Grooming: Independently;sitting Pt Will Perform Upper Body Dressing: Independently;sitting Pt Will Perform Lower Body Dressing: with modified independence;sitting/lateral leans Pt Will Transfer  to Toilet: squat pivot transfer;with min assist;bedside commode Additional ADL Goal #1: Pt will indep  complete bed mobility as a precursor to ADLs  OT Frequency: Min 2X/week    Co-evaluation PT/OT/SLP Co-Evaluation/Treatment: Yes Reason for Co-Treatment: Complexity of the patient's impairments (multi-system involvement);To address functional/ADL transfers;For patient/therapist safety PT goals addressed during session: Mobility/safety with mobility OT goals addressed during session: ADL's and self-care      AM-PAC OT "6 Clicks" Daily Activity     Outcome Measure Help from another person eating meals?: A Little Help from another person taking care of personal grooming?: A Little Help from another person toileting, which includes using toliet, bedpan, or urinal?: A Lot Help from another person bathing (including washing, rinsing, drying)?: A Lot Help from another person to put on and taking off regular upper body clothing?: A Little Help from another person to put on and taking off regular lower body clothing?: A Lot 6 Click Score: 15   End of Session Equipment Utilized During Treatment: Gait belt Nurse Communication: Mobility status  Activity Tolerance: Patient tolerated treatment well Patient left: in bed;with call bell/phone within reach;with bed alarm set;with family/visitor present  OT Visit Diagnosis: Unsteadiness on feet (R26.81);Other abnormalities of gait and mobility (R26.89);Muscle weakness (generalized) (M62.81);History of falling (Z91.81)                Time: 8768-1157 OT Time Calculation (min): 39 min Charges:  OT General Charges $OT Visit: 1 Visit OT Evaluation $OT Eval Moderate Complexity: 1 Mod   Adya Wirz D Causey 02/16/2022, 4:36 PM

## 2022-02-16 NOTE — Progress Notes (Signed)
PROGRESS NOTE    Larry [name]  TMH:[id number] DOB: 1950-06-25 DOA: 02/15/2022 PCP: Flossie Buffy, NP   Brief Narrative:  Larry [name] is a 71 y.o. male with medical history significant of hypertension, hyperlipidemia, diabetes mellitus type 2, multiple sclerosis, spinal cord injury, wheelchair-bound who presented with complaints of progressively worsening weakness over the last 2 weeks.  He had 2 falls while transferring here recently.  No reports of loss of consciousness or head trauma.  Some history of black tarry stools since last 2 weeks.  Patient takes meloxicam daily for joint pain as well as aspirin 81 mg.  Denies any other NSAIDs. Patient's last colonoscopy had been with Dr. Cristina Gong in 2005 which noted no acute abnormality and reports doing a Cologuard test which was negative last year.  Upon arrival to ED, he was hemodynamically stable. Labs significant for hemoglobin 4.5, BUN 37, creatinine 1.43, and glucose 182.  CT scan of the head noted no acute abnormality.  Stool guaiacs were noted to be positive.  He was started on Protonix drip and admitted to hospital service.  Assessment & Plan:   Principal Problem:   Symptomatic anemia Active Problems:   GI bleed   AKI (acute kidney injury) ([place])   Controlled type 2 DM with peripheral circulatory disorder (HCC)   Acute encephalopathy   MS (multiple sclerosis) (HCC)   GERD (gastroesophageal reflux disease)   Benign prostatic hyperplasia with lower urinary tract symptoms   Pressure injury of skin   Acute gastric ulcer with hemorrhage   Duodenal ulcer  Symptomatic anemia secondary to upper GI bleed/nonbleeding gastric and duodenal ulcer: Presented with hemoglobin 4.5, but previously had been 13.1 on 8/14.  Received 3 units of PRBC transfusion.  Hemoglobin over 8 today.  Underwent EGD by Dr. Tarri Glenn 02/16/2022 and was found to have nonbleeding gastric ulcers and duodenal ulcer with no stigmata of bleeding.  They recommended twice  daily PPI for at least 10 weeks and then daily.  We will transition him from IV to oral PPI.  Monitor H&H every 12 hours and transfuse if less than 7.     Acute kidney injury: Secondary to hypoperfusion due to severe anemia.  Hopefully creatinine has improved now, waiting for BMP.   Controlled diabetes mellitus type 2, without long-term use of insulin On admission glucose elevated at 182.  Last hemoglobin A1c 6.6 on 8/14.  Home medication regimen includes metformin.  Continue to hold metformin.  Start on SSI.  Transient hypotension: Blood pressures noted to be as low as 120/47.  Home blood pressure medication regimen includes olmesartan-amlodipine-hydrochlorothiazide 40-10-12.5 mg daily.  All medications are on hold.  Blood pressure now rising but we will continue to monitor off of antihypertensives for now.   Acute encephalopathy: Patient reported intermittent episodes of confusion.  CT scan of the head negative for any acute abnormality.  He is fully alert and oriented today.   MS spinal stenosis paraplegia wheelchair-bound Patient ambulates with use of a wheelchair, but have been having issues with transfers due to weakness with report of 2 recent falls. -Continue baclofen and pregabalin once blood pressure stable.  He says that he does not think that he is capable of living by himself and that his daughter may have to live with him.  I have consulted PT OT.   Bilateral lower extremity edema Chronic.  Patient with at least 2+ pitting bilateral lower extremity edema.  Chest x-ray did not note any acute abnormality.   BPH: Continue finasteride.  DVT prophylaxis: SCDs Start: 02/15/22 1240, avoiding heparin products due to GI bleed   Code Status: Full Code  Family Communication:  None present at bedside.  Plan of care discussed with patient in length and he/she verbalized understanding and agreed with it.  Status is: Inpatient Remains inpatient appropriate because: Needs monitoring  overnight and PT OT assessment.   Estimated body mass index is 26.16 kg/m as calculated from the following:   Height as of this encounter: '5\' 7"'$  (1.702 m).   Weight as of this encounter: 75.8 kg.  Pressure Injury 02/15/22 Sacrum Stage 2 -  Partial thickness loss of dermis presenting as a shallow open injury with a red, pink wound bed without slough. Redness to sacrum (Active)  02/15/22 1620  Location: Sacrum  Location Orientation:   Staging: Stage 2 -  Partial thickness loss of dermis presenting as a shallow open injury with a red, pink wound bed without slough.  Wound Description (Comments): Redness to sacrum  Present on Admission: Yes  Dressing Type Foam - Lift dressing to assess site every shift 02/15/22 1930   Nutritional Assessment: Body mass index is 26.16 kg/m.Marland Kitchen Seen by dietician.  I agree with the assessment and plan as outlined below: Nutrition Status:        . Skin Assessment: I have examined the patient's skin and I agree with the wound assessment as performed by the wound care RN as outlined below: Pressure Injury 02/15/22 Sacrum Stage 2 -  Partial thickness loss of dermis presenting as a shallow open injury with a red, pink wound bed without slough. Redness to sacrum (Active)  02/15/22 1620  Location: Sacrum  Location Orientation:   Staging: Stage 2 -  Partial thickness loss of dermis presenting as a shallow open injury with a red, pink wound bed without slough.  Wound Description (Comments): Redness to sacrum  Present on Admission: Yes  Dressing Type Foam - Lift dressing to assess site every shift 02/15/22 1930    Consultants:  GI  Procedures:  The  Antimicrobials:  Anti-infectives (From admission, onward)    None         Subjective: Patient seen and examined after EGD.  He says that he feels much better than yesterday since he received some blood transfusion.  He has no other complaint at the moment.  Objective: Vitals:   02/16/22 0940  02/16/22 0945 02/16/22 1000 02/16/22 1023  BP: 114/66 123/71 112/81 (!) 140/76  Pulse: 72 71 65 68  Resp: '14 14 17 15  '$ Temp: 98.2 F (36.8 C)  98.3 F (36.8 C) 98.7 F (37.1 C)  TempSrc:    Oral  SpO2: 100% 100% 98% 98%  Weight:      Height:        Intake/Output Summary (Last 24 hours) at 02/16/2022 1335 Last data filed at 02/16/2022 1224 Gross per 24 hour  Intake 1741.7 ml  Output 2250 ml  Net -508.3 ml   Filed Weights   02/15/22 1047  Weight: 75.8 kg    Examination:  General exam: Appears calm and comfortable  Respiratory system: Clear to auscultation. Respiratory effort normal. Cardiovascular system: S1 & S2 heard, RRR. No JVD, murmurs, rubs, gallops or clicks. No pedal edema. Gastrointestinal system: Abdomen is nondistended, soft and nontender. No organomegaly or masses felt. Normal bowel sounds heard. Central nervous system: Alert and oriented.  Deep history strength in bilateral lower extremity, at baseline. Extremities: Symmetric 5 x 5 power. Skin: No rashes, lesions or ulcers Psychiatry: Judgement  and insight appear normal. Mood & affect appropriate.    Data Reviewed: I have personally reviewed following labs and imaging studies  CBC: Recent Labs  Lab 02/15/22 1052 02/16/22 0157  WBC 4.9 5.7  NEUTROABS 3.9  --   HGB 4.5* 8.6*  HCT 15.4* 24.5*  MCV 104.8* 90.7  PLT 306 409   Basic Metabolic Panel: Recent Labs  Lab 02/15/22 1052  NA 143  K 3.8  CL 109  CO2 25  GLUCOSE 182*  BUN 37*  CREATININE 1.43*  CALCIUM 9.0   GFR: Estimated Creatinine Clearance: 43 mL/min (A) (by C-G formula based on SCr of 1.43 mg/dL (H)). Liver Function Tests: Recent Labs  Lab 02/15/22 1052  AST 26  ALT 18  ALKPHOS 55  BILITOT 0.3  PROT 5.8*  ALBUMIN 3.6   No results for input(s): "LIPASE", "AMYLASE" in the last 168 hours. No results for input(s): "AMMONIA" in the last 168 hours. Coagulation Profile: No results for input(s): "INR", "PROTIME" in the last 168  hours. Cardiac Enzymes: No results for input(s): "CKTOTAL", "CKMB", "CKMBINDEX", "TROPONINI" in the last 168 hours. BNP (last 3 results) No results for input(s): "PROBNP" in the last 8760 hours. HbA1C: No results for input(s): "HGBA1C" in the last 72 hours. CBG: Recent Labs  Lab 02/15/22 1156 02/15/22 1811 02/16/22 0915 02/16/22 0944 02/16/22 1156  GLUCAP 167* 111* 110* 111* 117*   Lipid Profile: No results for input(s): "CHOL", "HDL", "LDLCALC", "TRIG", "CHOLHDL", "LDLDIRECT" in the last 72 hours. Thyroid Function Tests: No results for input(s): "TSH", "T4TOTAL", "FREET4", "T3FREE", "THYROIDAB" in the last 72 hours. Anemia Panel: No results for input(s): "VITAMINB12", "FOLATE", "FERRITIN", "TIBC", "IRON", "RETICCTPCT" in the last 72 hours. Sepsis Labs: No results for input(s): "PROCALCITON", "LATICACIDVEN" in the last 168 hours.  Recent Results (from the past 240 hour(s))  Resp Panel by RT-PCR (Flu A&B, Covid) Anterior Nasal Swab     Status: None   Collection Time: 02/15/22 11:13 AM   Specimen: Anterior Nasal Swab  Result Value Ref Range Status   SARS Coronavirus 2 by RT PCR NEGATIVE NEGATIVE Final    Comment: (NOTE) SARS-CoV-2 target nucleic acids are NOT DETECTED.  The SARS-CoV-2 RNA is generally detectable in upper respiratory specimens during the acute phase of infection. The lowest concentration of SARS-CoV-2 viral copies this assay can detect is 138 copies/mL. A negative result does not preclude SARS-Cov-2 infection and should not be used as the sole basis for treatment or other patient management decisions. A negative result may occur with  improper specimen collection/handling, submission of specimen other than nasopharyngeal swab, presence of viral mutation(s) within the areas targeted by this assay, and inadequate number of viral copies(<138 copies/mL). A negative result must be combined with clinical observations, patient history, and  epidemiological information. The expected result is Negative.  Fact Sheet for Patients:  EntrepreneurPulse.com.au  Fact Sheet for Healthcare Providers:  IncredibleEmployment.be  This test is no t yet approved or cleared by the [place] FDA and  has been authorized for detection and/or diagnosis of SARS-CoV-2 by FDA under an Emergency Use Authorization (EUA). This EUA will remain  in effect (meaning this test can be used) for the duration of the COVID-19 declaration under Section 564(b)(1) of the Act, 21 U.S.C.section 360bbb-3(b)(1), unless the authorization is terminated  or revoked sooner.       Influenza A by PCR NEGATIVE NEGATIVE Final   Influenza B by PCR NEGATIVE NEGATIVE Final    Comment: (NOTE) The Xpert Xpress SARS-CoV-2/FLU/RSV plus  assay is intended as an aid in the diagnosis of influenza from Nasopharyngeal swab specimens and should not be used as a sole basis for treatment. Nasal washings and aspirates are unacceptable for Xpert Xpress SARS-CoV-2/FLU/RSV testing.  Fact Sheet for Patients: EntrepreneurPulse.com.au  Fact Sheet for Healthcare Providers: IncredibleEmployment.be  This test is not yet approved or cleared by the [place] FDA and has been authorized for detection and/or diagnosis of SARS-CoV-2 by FDA under an Emergency Use Authorization (EUA). This EUA will remain in effect (meaning this test can be used) for the duration of the COVID-19 declaration under Section 564(b)(1) of the Act, 21 U.S.C. section 360bbb-3(b)(1), unless the authorization is terminated or revoked.  Performed at [facility], [place] [address], [place], [place] [address]      Radiology Studies: CT Head Wo Contrast  Result Date: 02/15/2022 CLINICAL DATA:  Neuro deficit.  Stroke suspected. EXAM: CT HEAD WITHOUT CONTRAST TECHNIQUE: Contiguous axial images were obtained from the base of the skull  through the vertex without intravenous contrast. RADIATION DOSE REDUCTION: This exam was performed according to the departmental dose-optimization program which includes automated exposure control, adjustment of the mA and/or kV according to patient size and/or use of iterative reconstruction technique. COMPARISON:  Brain MRI 07/01/2018 FINDINGS: Brain: No evidence of acute infarction, hemorrhage, hydrocephalus, extra-axial collection or mass lesion/mass effect. There is mild patchy low-attenuation within the subcortical and periventricular white matter compatible with chronic microvascular disease. Vascular: No hyperdense vessel or unexpected calcification. Skull: Normal. Negative for fracture or focal lesion. Sinuses/Orbits: No acute finding. Other: None. IMPRESSION: 1. No acute intracranial abnormalities. 2. Chronic small vessel ischemic disease. Electronically Signed   By: Kerby Moors M.D.   On: 02/15/2022 12:02   DG Chest Portable 1 View  Result Date: 02/15/2022 CLINICAL DATA:  Weakness. EXAM: PORTABLE CHEST 1 VIEW COMPARISON:  CT 05/23/2020 FINDINGS: There is mild cardiac enlargement. No pleural effusion or edema. No airspace opacities identified. Scarring noted in the left midlung. IMPRESSION: No acute cardiopulmonary abnormalities. Electronically Signed   By: Kerby Moors M.D.   On: 02/15/2022 11:57    Scheduled Meds:  sodium chloride   Intravenous Once   atorvastatin  20 mg Oral Daily   finasteride  5 mg Oral Daily   insulin aspart  0-15 Units Subcutaneous TID WC   insulin aspart  0-5 Units Subcutaneous QHS   mirabegron ER  25 mg Oral Daily   pantoprazole  40 mg Oral BID AC   sodium chloride flush  3 mL Intravenous Q12H   traZODone  50 mg Oral QHS   Continuous Infusions:   LOS: 1 day   Darliss Cheney, MD [facility]  02/16/2022, 1:35 PM   *Please note that this is a verbal dictation therefore any spelling or grammatical errors are due to the "[place]" system  interpretation.  Please page via [place] and do not message via secure chat for urgent patient care matters. Secure chat can be used for non urgent patient care matters.  How to contact the [facility] Attending or Consulting provider [place] or covering provider during after hours [place], for this patient?  Check the care team in [facility] and look for a) attending/consulting [facility] provider listed and b) the [facility] team listed. Page or secure chat [phone]. Log into [email or web address] and use [place]'s universal password to access. If you do not have the password, please contact the hospital operator. Locate the [facility] provider you are looking for under  [facility] and page to a number that you can be directly reached. If you still have difficulty reaching the provider, please page the [facility] (Director on Call) for the Hospitalists listed on amion for assistance.

## 2022-02-16 NOTE — Interval H&P Note (Signed)
History and Physical Interval Note:  02/16/2022 8:22 AM  Larry [name]  has presented today for surgery, with the diagnosis of Gi Bleed.  The various methods of treatment have been discussed with the patient and family. After consideration of risks, benefits and other options for treatment, the patient has consented to  Procedure(s): ESOPHAGOGASTRODUODENOSCOPY (EGD) WITH PROPOFOL (N/A) as a surgical intervention.  The patient's history has been reviewed, patient examined, no change in status, stable for surgery.  I have reviewed the patient's chart and labs.  Questions were answered to the patient's satisfaction.     Thornton Park

## 2022-02-16 NOTE — Op Note (Addendum)
[facility] Patient Name: Larry [name] Procedure Date : 02/16/2022 MRN: [id number] Attending MD: Thornton Park MD, MD Date of Birth: 08/09/1950 CSN: [id number] Age: 71 Admit Type: Inpatient Procedure:                Upper GI endoscopy Indications:              Unexplained iron deficiency anemia, Hematochezia Providers:                Thornton Park MD, MD, Mikey College, RN,                            Fransico Setters Mbumina, Technician Referring MD:              Medicines:                Monitored Anesthesia Care Complications:            No immediate complications. Estimated Blood Loss:     Estimated blood loss was minimal. Procedure:                Pre-Anesthesia Assessment:                           - Prior to the procedure, a History and Physical                            was performed, and patient medications and                            allergies were reviewed. The patient's tolerance of                            previous anesthesia was also reviewed. The risks                            and benefits of the procedure and the sedation                            options and risks were discussed with the patient.                            All questions were answered, and informed consent                            was obtained. Prior Anticoagulants: The patient has                            taken no previous anticoagulant or antiplatelet                            agents. ASA Grade Assessment: III - A patient with                            severe systemic disease. After reviewing the risks  and benefits, the patient was deemed in                            satisfactory condition to undergo the procedure.                           After obtaining informed consent, the endoscope was                            passed under direct vision. Throughout the                            procedure, the patient's blood pressure, pulse, and                             oxygen saturations were monitored continuously. The                            [id number]-H190 ([id number]) Olympus endoscope was introduced                            through the mouth, and advanced to the third part                            of duodenum. The upper GI endoscopy was                            accomplished without difficulty. The patient                            tolerated the procedure well. Scope In: Scope Out: Findings:      The esophagus was normal.      Many non-bleeding cratered gastric ulcers with no stigmata of bleeding       were found in the gastric antrum and distal body. The largest lesion was       4 mm in largest dimension. Biopsies were taken from the antrum, body,       and fundus with a cold forceps for histology. Estimated blood loss was       minimal.      One non-bleeding cratered duodenal ulcer with no stigmata of bleeding       was found in the distal duodenal bulb. The lesion was 10 mm in largest       dimension.      No active bleeding bleeding noted during this procedure. No old present. Impression:               - Normal esophagus.                           - Non-bleeding gastric ulcers with no stigmata of                            bleeding. Biopsied.                           - Non-bleeding duodenal ulcer  with no stigmata of                            bleeding. Recommendation:           - Return patient to hospital ward for ongoing care.                           - Advance diet as tolerated.                           - Continue present medications.                           - Continue serial hgb/hct with transfusion as                            indicated.                           - Pantoprazole 40 mg twice daily for at least 10                            weeks. Then, reduce to 40 mg QAM.                           - No aspirin, ibuprofen, naproxen, or other                            non-steroidal anti-inflammatory drugs  including                            meloxicam.                           - Await pathology results.                           - Repeat upper endoscopy in 10-12 weeks to check                            healing.                           I called the patient's daughter, Larry [name], by                            phone after the procedure. She was unable to take                            my call at the time and I left a message.                           The inpatient GI team will move to stand-by. I will  arrange outpatient follow-up with Dr. Henrene Pastor. Please                            call the on-call gastroenterologist with any                            additional questions or concerns during this                            hospitalization. Procedure Code(s):        --- Professional ---                           [phone], Esophagogastroduodenoscopy, flexible,                            transoral; with biopsy, single or multiple Diagnosis Code(s):        --- Professional ---                           K25.9, Gastric ulcer, unspecified as acute or                            chronic, without hemorrhage or perforation                           K26.9, Duodenal ulcer, unspecified as acute or                            chronic, without hemorrhage or perforation                           D50.9, Iron deficiency anemia, unspecified                           K92.1, Melena (includes Hematochezia) CPT copyright 2019 American Medical Association. All rights reserved. The codes documented in this report are preliminary and upon coder review may  be revised to meet current compliance requirements. Thornton Park MD, MD 02/16/2022 9:49:15 AM This report has been signed electronically. Number of Addenda: 0

## 2022-02-16 NOTE — Transfer of Care (Signed)
Immediate Anesthesia Transfer of Care Note  Patient: Larry [name]  Procedure(s) Performed: ESOPHAGOGASTRODUODENOSCOPY (EGD) WITH PROPOFOL BIOPSY  Patient Location: PACU  Anesthesia Type:MAC  Level of Consciousness: awake, alert  and oriented  Airway & Oxygen Therapy: Patient Spontanous Breathing  Post-op Assessment: Report given to RN and Post -op Vital signs reviewed and stable  Post vital signs: Reviewed and stable  Last Vitals:  Vitals Value Taken Time  BP 114/66 02/16/22 0939  Temp 36.8 C 02/16/22 0940  Pulse 73 02/16/22 0941  Resp 18 02/16/22 0941  SpO2 100 % 02/16/22 0941  Vitals shown include unvalidated device data.  Last Pain:  Vitals:   02/16/22 0940  TempSrc:   PainSc: 0-No pain         Complications: No notable events documented.

## 2022-02-17 DIAGNOSIS — D649 Anemia, unspecified: Secondary | ICD-10-CM | POA: Diagnosis not present

## 2022-02-17 LAB — GLUCOSE, CAPILLARY
Glucose-Capillary: 152 mg/dL — ABNORMAL HIGH (ref 70–99)
Glucose-Capillary: 134 mg/dL — ABNORMAL HIGH (ref 70–99)
Glucose-Capillary: 166 mg/dL — ABNORMAL HIGH (ref 70–99)

## 2022-02-17 LAB — CBC
HCT: 29 % — ABNORMAL LOW (ref 39.0–52.0)
Hemoglobin: 10 g/dL — ABNORMAL LOW (ref 13.0–17.0)
MCH: 31.8 pg (ref 26.0–34.0)
MCHC: 34.5 g/dL (ref 30.0–36.0)
MCV: 92.4 fL (ref 80.0–100.0)
Platelets: 304 10*3/uL (ref 150–400)
RBC: 3.14 MIL/uL — ABNORMAL LOW (ref 4.22–5.81)
RDW: 15.4 % (ref 11.5–15.5)
WBC: 6.4 10*3/uL (ref 4.0–10.5)
nRBC: 0.3 % — ABNORMAL HIGH (ref 0.0–0.2)

## 2022-02-17 LAB — SURGICAL PATHOLOGY

## 2022-02-17 MED ORDER — BACLOFEN 20 MG PO TABS: 20.0000 mg | ORAL_TABLET | Freq: Three times a day (TID) | ORAL | 0 refills | Status: DC

## 2022-02-17 NOTE — Progress Notes (Signed)
Physical Therapy Treatment Patient Details Name: Larry [name] MRN: [id number] DOB: 09-09-1950 Today's Date: 02/17/2022   History of Present Illness Pt is a 71 y.o. M who presents 02/15/2022 with progressively worsening weakness over last 2 weeks. Admitted with symptomatic anemia secondary to GIB/nonbleeding gastric and duodenal ulcer. Significant PMH: HTN, HLD, DM2, MS, SCI.    PT Comments    Pt tolerating sitting up in chair ~50 minutes. Assisted with transfer back to bed. Utilized [employer] with good success; pt requiring minimal assist overall. Continues to report bilateral hand/BLE numbness, weakness, and bladder incontinence, all of which pt states are acute issues. MD notified. Will continue plan of care.     Recommendations for follow up therapy are one component of a multi-disciplinary discharge planning process, led by the attending physician.  Recommendations may be updated based on patient status, additional functional criteria and insurance authorization.  Follow Up Recommendations  Skilled nursing-short term rehab (<3 hours/day) Can patient physically be transported by private vehicle: No   Assistance Recommended at Discharge Intermittent Supervision/Assistance  Patient can return home with the following A lot of help with walking and/or transfers;A lot of help with bathing/dressing/bathroom   Equipment Recommendations  Other (comment) (slide board)    Recommendations for Other Services       Precautions / Restrictions Precautions Precautions: Fall Restrictions Weight Bearing Restrictions: No     Mobility  Bed Mobility Overal bed mobility: Needs Assistance Bed Mobility: Sit to Supine     Supine to sit: Min assist Sit to supine: Mod assist   General bed mobility comments: ModA for BLE management back into bed    Transfers Overall transfer level: Needs assistance Equipment used: Sliding board Transfers: Bed to chair/wheelchair/BSC             Lateral/Scoot Transfers: Min assist General transfer comment: totalA for placement of slide board, placement of BLE's and cues for hand placement. MinA for transfer towards right from bed to chair    Ambulation/Gait                   Stairs             Wheelchair Mobility    Modified Rankin (Stroke Patients Only)       Balance Overall balance assessment: Needs assistance Sitting-balance support: Feet supported Sitting balance-Leahy Scale: Fair Sitting balance - Comments: progressing to no UE support with supervision                                    Cognition Arousal/Alertness: Awake/alert Behavior During Therapy: WFL for tasks assessed/performed Overall Cognitive Status: Within Functional Limits for tasks assessed                                          Exercises Other Exercises Other Exercises: Sitting: functional reaching with BUE's    General Comments        Pertinent Vitals/Pain Pain Assessment Pain Assessment: No/denies pain    Home Living                          Prior Function            PT Goals (current goals can now be found in the care plan section) Acute Rehab PT Goals Patient  Stated Goal: improve strength Potential to Achieve Goals: Good Progress towards PT goals: Progressing toward goals    Frequency    Min 3X/week      PT Plan Current plan remains appropriate    Co-evaluation              AM-PAC PT "6 Clicks" Mobility   Outcome Measure  Help needed turning from your back to your side while in a flat bed without using bedrails?: A Little Help needed moving from lying on your back to sitting on the side of a flat bed without using bedrails?: A Little Help needed moving to and from a bed to a chair (including a wheelchair)?: A Lot Help needed standing up from a chair using your arms (e.g., wheelchair or bedside chair)?: Total Help needed to walk in hospital room?:  Total Help needed climbing 3-5 steps with a railing? : Total 6 Click Score: 11    End of Session Equipment Utilized During Treatment: Gait belt Activity Tolerance: Patient tolerated treatment well Patient left: with call bell/phone within reach;in chair Nurse Communication: Mobility status PT Visit Diagnosis: Other abnormalities of gait and mobility (R26.89);History of falling (Z91.81)     Time: 4010-2725 PT Time Calculation (min) (ACUTE ONLY): 13 min  Charges:  $Therapeutic Activity: 8-22 mins                     Wyona Almas, PT, DPT Acute Rehabilitation Services Office [phone]    Deno Etienne 02/17/2022, 4:32 PM

## 2022-02-17 NOTE — Anesthesia Postprocedure Evaluation (Signed)
Anesthesia Post Note  Patient: Itay Mella  Procedure(s) Performed: ESOPHAGOGASTRODUODENOSCOPY (EGD) WITH PROPOFOL BIOPSY     Patient location during evaluation: PACU Anesthesia Type: MAC Level of consciousness: awake and alert Pain management: pain level controlled Vital Signs Assessment: post-procedure vital signs reviewed and stable Respiratory status: spontaneous breathing, nonlabored ventilation, respiratory function stable and patient connected to nasal cannula oxygen Cardiovascular status: stable and blood pressure returned to baseline Postop Assessment: no apparent nausea or vomiting Anesthetic complications: no   No notable events documented.  Last Vitals:  Vitals:   02/16/22 2001 02/17/22 0433  BP: (!) 130/97 (!) 125/38  Pulse: 86 71  Resp: 19 (!) 21  Temp: 37.2 C 37.1 C  SpO2: 100% 96%    Last Pain:  Vitals:   02/17/22 0433  TempSrc: Oral  PainSc:                  March Rummage Emilyann Banka

## 2022-02-17 NOTE — NC FL2 (Signed)
[place] LEVEL OF CARE SCREENING TOOL     IDENTIFICATION  Patient Name: Larry [name] Birthdate: [birth year] Sex: male Admission Date (Current Location): 02/15/2022  [facility] and [place] Number:  Herbalist and Address:  The [place], [place] [address] [place], [place] [address]      Provider Number: [id number]  Attending Physician Name and Address:  Darliss Cheney, MD  Relative Name and Phone Number:  [name] [phone]    Current Level of Care: Hospital Recommended Level of Care: [place] Prior Approval Number:    Date Approved/Denied:   PASRR Number: [id number]  Discharge Plan: SNF    Current Diagnoses: Patient Active Problem List   Diagnosis Date Noted   Pressure injury of skin 02/16/2022   Acute gastric ulcer with hemorrhage    Duodenal ulcer    ABLA (acute blood loss anemia) 02/15/2022   Symptomatic anemia 02/15/2022   Acute encephalopathy 02/15/2022   GI bleed 02/15/2022   Controlled type 2 DM with peripheral circulatory disorder ([place]) 01/12/2022   History of pulmonary embolism 07/15/2021   Bilateral leg edema 02/09/2020   Hyperlipidemia associated with type 2 diabetes mellitus ([place]) 11/09/2019   Benign prostatic hyperplasia with lower urinary tract symptoms 09/14/2019   Cervical stenosis of spinal canal 05/09/2019   Thoracic spinal stenosis 05/09/2019   Gait abnormality 08/08/2018   Spinal stenosis 08/08/2018   Iron deficiency anemia 02/09/2018   Bowel incontinence 01/01/2017   Vitamin D deficiency 01/11/2016   GERD (gastroesophageal reflux disease) 01/11/2016   Spinal stenosis, lumbar region, with neurogenic claudication 01/10/2016   Spinal stenosis of lumbar region with radiculopathy 01/10/2016   Elective surgery    Tachycardia    AKI (acute kidney injury) ([place])    Peripheral neuropathy    Spondylolisthesis of lumbar region 12/30/2015   CTS (carpal tunnel syndrome) 09/26/2015   Lumbar  stenosis 09/26/2015   Weakness 09/05/2015   Spinal stenosis in cervical region 06/12/2015   Leukocytopenia 05/22/2013   History of spinal cord injury 12/12/2011   Paraplegia ([place]) 12/11/2011   Altered mental status 12/11/2011   DM (diabetes mellitus) ([place]) 12/11/2011   Overactive bladder 12/11/2011   MS (multiple sclerosis) ([facility])    Hypertension     Orientation RESPIRATION BLADDER Height & Weight     Self, Time, Situation, Place  Normal Incontinent, External catheter Weight: 167 lb (75.8 kg) Height:  '5\' 7"'$  (170.2 cm)  BEHAVIORAL SYMPTOMS/MOOD NEUROLOGICAL BOWEL NUTRITION STATUS      Incontinent Diet (See DC summary)  AMBULATORY STATUS COMMUNICATION OF NEEDS Skin   Total Care (Wheelchair bound at baseline) Verbally PU Stage and Appropriate Care (Sacrum Pressure Ulcer)                       Personal Care Assistance Level of Assistance  Bathing, Feeding, Dressing Bathing Assistance: Maximum assistance Feeding assistance: Independent Dressing Assistance: Maximum assistance     Functional Limitations Info  Sight, Hearing, Speech Sight Info: Adequate   Speech Info: Adequate    SPECIAL CARE FACTORS FREQUENCY  PT (By licensed PT), OT (By licensed OT)     PT Frequency: 5x week OT Frequency: 5x week            Contractures Contractures Info: Not present    Additional Factors Info  Code Status, Allergies, Insulin Sliding Scale Code Status Info: Full Allergies Info: Rosuvastatin   Insulin Sliding Scale Info: See DC summary  Current Medications (02/17/2022):  This is the current hospital active medication list Current Facility-Administered Medications  Medication Dose Route Frequency Provider Last Rate Last Admin   0.9 %  sodium chloride infusion (Manually program via Guardrails IV Fluids)   Intravenous Once Norval Morton, MD   Held at 02/15/22 1227   acetaminophen (TYLENOL) tablet 650 mg  650 mg Oral Q6H PRN Norval Morton, MD       Or    acetaminophen (TYLENOL) suppository 650 mg  650 mg Rectal Q6H PRN Fuller Plan A, MD       albuterol (PROVENTIL) (2.5 MG/3ML) 0.083% nebulizer solution 2.5 mg  2.5 mg Nebulization Q6H PRN Tamala Julian, Rondell A, MD       atorvastatin (LIPITOR) tablet 20 mg  20 mg Oral Daily Tamala Julian, Rondell A, MD   20 mg at 02/17/22 0958   finasteride (PROSCAR) tablet 5 mg  5 mg Oral Daily Tamala Julian, Rondell A, MD   5 mg at 02/17/22 0958   insulin aspart (novoLOG) injection 0-15 Units  0-15 Units Subcutaneous TID WC Darliss Cheney, MD   3 Units at 02/17/22 1224   insulin aspart (novoLOG) injection 0-5 Units  0-5 Units Subcutaneous QHS Pahwani, Einar Grad, MD       mirabegron ER (MYRBETRIQ) tablet 25 mg  25 mg Oral Daily Gor Vestal, Rondell A, MD   25 mg at 02/17/22 0958   ondansetron (ZOFRAN) tablet 4 mg  4 mg Oral Q6H PRN Fuller Plan A, MD       Or   ondansetron (ZOFRAN) injection 4 mg  4 mg Intravenous Q6H PRN Tyrell Seifer, Rondell A, MD       pantoprazole (PROTONIX) EC tablet 40 mg  40 mg Oral BID AC Pahwani, Ravi, MD   40 mg at 02/17/22 0903   sodium chloride flush (NS) 0.9 % injection 3 mL  3 mL Intravenous Q12H Kalise Fickett, Rondell A, MD   3 mL at 02/17/22 1035   traZODone (DESYREL) tablet 50 mg  50 mg Oral QHS Fuller Plan A, MD   50 mg at 02/16/22 2148     Discharge Medications: Please see discharge summary for a list of discharge medications.  Relevant Imaging Results:  Relevant Lab Results:   Additional Information ss#[id number]  Coralee P[place], [place]

## 2022-02-17 NOTE — Progress Notes (Signed)
     [facility] Progress Note  Pathology results from recent EGD are now available:   A. STOMACH, BIOPSY:  Reactive gastropathy with mild chronic gastritis  Negative for H. pylori, intestinal metaplasia, dysplasia and carcinoma   Given these results I recommend: - Continue serial hgb/hct with transfusion as indicated. - Pantoprazole 40 mg twice daily for at least 10 weeks. Then, reduce to 40 mg QAM. - No aspirin, ibuprofen, naproxen, or other non-steroidal anti-inflammatory drugs including meloxicam. - Await pathology results. - Repeat upper endoscopy in 10-12 weeks to check healing of peptic ulcer disease.  GI will sign-off. Please call the on-call gastroenterologist with any additional questions or concerns during this hospitalization.

## 2022-02-17 NOTE — Progress Notes (Signed)
PROGRESS NOTE    Larry [name]  CHE:[id number] DOB: 1951/05/22 DOA: 02/15/2022 PCP: Flossie Buffy, NP   Brief Narrative:  Larry [name] is a 71 y.o. male with medical history significant of hypertension, hyperlipidemia, diabetes mellitus type 2, multiple sclerosis, spinal cord injury, wheelchair-bound who presented with complaints of progressively worsening weakness over the last 2 weeks.  He had 2 falls while transferring here recently.  No reports of loss of consciousness or head trauma.  Some history of black tarry stools since last 2 weeks.  Patient takes meloxicam daily for joint pain as well as aspirin 81 mg.  Denies any other NSAIDs. Patient's last colonoscopy had been with Dr. Cristina Gong in 2005 which noted no acute abnormality and reports doing a Cologuard test which was negative last year.  Upon arrival to ED, he was hemodynamically stable. Labs significant for hemoglobin 4.5, BUN 37, creatinine 1.43, and glucose 182.  CT scan of the head noted no acute abnormality.  Stool guaiacs were noted to be positive.  He was started on Protonix drip and admitted to hospital service.  Assessment & Plan:   Principal Problem:   Symptomatic anemia Active Problems:   GI bleed   AKI (acute kidney injury) ([place])   Controlled type 2 DM with peripheral circulatory disorder (HCC)   Acute encephalopathy   MS (multiple sclerosis) (HCC)   GERD (gastroesophageal reflux disease)   Benign prostatic hyperplasia with lower urinary tract symptoms   ABLA (acute blood loss anemia)   Pressure injury of skin   Acute gastric ulcer with hemorrhage   Duodenal ulcer  Symptomatic anemia secondary to upper GI bleed/nonbleeding gastric and duodenal ulcer: Presented with hemoglobin 4.5, but previously had been 13.1 on 8/14.  Received 3 units of PRBC transfusion. Underwent EGD by Dr. Tarri Glenn 02/16/2022 and was found to have nonbleeding gastric ulcers and duodenal ulcer with no stigmata of bleeding.  They recommended  twice daily PPI for at least 10 weeks and then daily.  Hemoglobin has remained stable.   Acute kidney injury: Secondary to hypoperfusion due to severe anemia.  Now resolved.   Controlled diabetes mellitus type 2, without long-term use of insulin On admission glucose elevated at 182.  Last hemoglobin A1c 6.6 on 8/14.  Home medication regimen includes metformin.  Continue to hold metformin.  Blood sugar controlled on SSI.  Transient hypotension: Blood pressures noted to be as low as 120/47.  Home blood pressure medication regimen includes olmesartan-amlodipine-hydrochlorothiazide 40-10-12.5 mg daily.  All medications are on hold.  Blood pressure stable, monitor off of antihypertensives for now.   Acute encephalopathy: Patient reported intermittent episodes of confusion.  CT scan of the head negative for any acute abnormality.  He is fully alert and oriented today.   MS spinal stenosis paraplegia wheelchair-bound Patient ambulates with use of a wheelchair, but have been having issues with transfers due to weakness with report of 2 recent falls. -Continue baclofen and pregabalin once blood pressure stable.  He says that he does not think that he is capable of living by himself and that his daughter may have to live with him.  Seen by PT OT but they recommended CIR.  CIR declined him.  He is interested in going to SNF.  TOC is aware and working on placement.   BPH: Continue finasteride.  DVT prophylaxis: SCDs Start: 02/15/22 1240, avoiding heparin products due to GI bleed   Code Status: Full Code  Family Communication:  None present at bedside.  Plan of care discussed  with patient in length and he/she verbalized understanding and agreed with it.  Status is: Inpatient Remains inpatient appropriate because: Medically stable.  Pending placement.  Estimated body mass index is 26.16 kg/m as calculated from the following:   Height as of this encounter: '5\' 7"'$  (1.702 m).   Weight as of this  encounter: 75.8 kg.  Pressure Injury 02/15/22 Sacrum Stage 2 -  Partial thickness loss of dermis presenting as a shallow open injury with a red, pink wound bed without slough. Redness to sacrum (Active)  02/15/22 1620  Location: Sacrum  Location Orientation:   Staging: Stage 2 -  Partial thickness loss of dermis presenting as a shallow open injury with a red, pink wound bed without slough.  Wound Description (Comments): Redness to sacrum  Present on Admission: Yes  Dressing Type None 02/17/22 0940   Nutritional Assessment: Body mass index is 26.16 kg/m.Marland Kitchen Seen by dietician.  I agree with the assessment and plan as outlined below: Nutrition Status:        . Skin Assessment: I have examined the patient's skin and I agree with the wound assessment as performed by the wound care RN as outlined below: Pressure Injury 02/15/22 Sacrum Stage 2 -  Partial thickness loss of dermis presenting as a shallow open injury with a red, pink wound bed without slough. Redness to sacrum (Active)  02/15/22 1620  Location: Sacrum  Location Orientation:   Staging: Stage 2 -  Partial thickness loss of dermis presenting as a shallow open injury with a red, pink wound bed without slough.  Wound Description (Comments): Redness to sacrum  Present on Admission: Yes  Dressing Type None 02/17/22 0940    Consultants:  GI  Procedures:  The  Antimicrobials:  Anti-infectives (From admission, onward)    None         Subjective:  Patient seen and examined.  He has no complaints.  He is interested in going to SNF.  Objective: Vitals:   02/17/22 0433 02/17/22 0742 02/17/22 0852 02/17/22 1323  BP: (!) 125/38 122/82 (!) 139/93 137/81  Pulse: 71 81 86 87  Resp: (!) '21 20 18 17  '$ Temp: 98.7 F (37.1 C) 98.8 F (37.1 C)  98 F (36.7 C)  TempSrc: Oral Oral  Oral  SpO2: 96% 98% 96% 100%  Weight:      Height:        Intake/Output Summary (Last 24 hours) at 02/17/2022 1326 Last data filed at  02/17/2022 0900 Gross per 24 hour  Intake 240 ml  Output 2100 ml  Net -1860 ml    Filed Weights   02/15/22 1047  Weight: 75.8 kg    Examination:  General exam: Appears calm and comfortable  Respiratory system: Clear to auscultation. Respiratory effort normal. Cardiovascular system: S1 & S2 heard, RRR. No JVD, murmurs, rubs, gallops or clicks. No pedal edema. Gastrointestinal system: Abdomen is nondistended, soft and nontender. No organomegaly or masses felt. Normal bowel sounds heard. Central nervous system: Alert and oriented. No focal neurological deficits. Extremities: Symmetric 5 x 5 power. Skin: No rashes, lesions or ulcers.  Psychiatry: Judgement and insight appear normal. Mood & affect appropriate.   Data Reviewed: I have personally reviewed following labs and imaging studies  CBC: Recent Labs  Lab 02/15/22 1052 02/16/22 0157 02/16/22 1256 02/16/22 1640 02/17/22 1035  WBC 4.9 5.7 5.7 7.0 6.4  NEUTROABS 3.9  --   --   --   --   HGB 4.5* 8.6* 9.1* 9.9* 10.0*  HCT 15.4* 24.5* 27.3* 28.8* 29.0*  MCV 104.8* 90.7 93.5 91.4 92.4  PLT 306 271 278 300 371    Basic Metabolic Panel: Recent Labs  Lab 02/15/22 1052 02/16/22 1640  NA 143 140  K 3.8 4.0  CL 109 107  CO2 25 24  GLUCOSE 182* 145*  BUN 37* 18  CREATININE 1.43* 1.10  CALCIUM 9.0 9.2    GFR: Estimated Creatinine Clearance: 55.9 mL/min (by C-G formula based on SCr of 1.1 mg/dL). Liver Function Tests: Recent Labs  Lab 02/15/22 1052  AST 26  ALT 18  ALKPHOS 55  BILITOT 0.3  PROT 5.8*  ALBUMIN 3.6    No results for input(s): "LIPASE", "AMYLASE" in the last 168 hours. No results for input(s): "AMMONIA" in the last 168 hours. Coagulation Profile: No results for input(s): "INR", "PROTIME" in the last 168 hours. Cardiac Enzymes: No results for input(s): "CKTOTAL", "CKMB", "CKMBINDEX", "TROPONINI" in the last 168 hours. BNP (last 3 results) No results for input(s): "PROBNP" in the last 8760  hours. HbA1C: No results for input(s): "HGBA1C" in the last 72 hours. CBG: Recent Labs  Lab 02/16/22 0944 02/16/22 1156 02/16/22 1627 02/16/22 2001 02/17/22 1206  GLUCAP 111* 117* 151* 128* 152*    Lipid Profile: No results for input(s): "CHOL", "HDL", "LDLCALC", "TRIG", "CHOLHDL", "LDLDIRECT" in the last 72 hours. Thyroid Function Tests: No results for input(s): "TSH", "T4TOTAL", "FREET4", "T3FREE", "THYROIDAB" in the last 72 hours. Anemia Panel: No results for input(s): "VITAMINB12", "FOLATE", "FERRITIN", "TIBC", "IRON", "RETICCTPCT" in the last 72 hours. Sepsis Labs: No results for input(s): "PROCALCITON", "LATICACIDVEN" in the last 168 hours.  Recent Results (from the past 240 hour(s))  Resp Panel by RT-PCR (Flu A&B, Covid) Anterior Nasal Swab     Status: None   Collection Time: 02/15/22 11:13 AM   Specimen: Anterior Nasal Swab  Result Value Ref Range Status   SARS Coronavirus 2 by RT PCR NEGATIVE NEGATIVE Final    Comment: (NOTE) SARS-CoV-2 target nucleic acids are NOT DETECTED.  The SARS-CoV-2 RNA is generally detectable in upper respiratory specimens during the acute phase of infection. The lowest concentration of SARS-CoV-2 viral copies this assay can detect is 138 copies/mL. A negative result does not preclude SARS-Cov-2 infection and should not be used as the sole basis for treatment or other patient management decisions. A negative result may occur with  improper specimen collection/handling, submission of specimen other than nasopharyngeal swab, presence of viral mutation(s) within the areas targeted by this assay, and inadequate number of viral copies(<138 copies/mL). A negative result must be combined with clinical observations, patient history, and epidemiological information. The expected result is Negative.  Fact Sheet for Patients:  EntrepreneurPulse.com.au  Fact Sheet for Healthcare Providers:   IncredibleEmployment.be  This test is no t yet approved or cleared by the [place] FDA and  has been authorized for detection and/or diagnosis of SARS-CoV-2 by FDA under an Emergency Use Authorization (EUA). This EUA will remain  in effect (meaning this test can be used) for the duration of the COVID-19 declaration under Section 564(b)(1) of the Act, 21 U.S.C.section 360bbb-3(b)(1), unless the authorization is terminated  or revoked sooner.       Influenza A by PCR NEGATIVE NEGATIVE Final   Influenza B by PCR NEGATIVE NEGATIVE Final    Comment: (NOTE) The Xpert Xpress SARS-CoV-2/FLU/RSV plus assay is intended as an aid in the diagnosis of influenza from Nasopharyngeal swab specimens and should not be used as a sole basis for treatment.  Nasal washings and aspirates are unacceptable for Xpert Xpress SARS-CoV-2/FLU/RSV testing.  Fact Sheet for Patients: EntrepreneurPulse.com.au  Fact Sheet for Healthcare Providers: IncredibleEmployment.be  This test is not yet approved or cleared by the [place] FDA and has been authorized for detection and/or diagnosis of SARS-CoV-2 by FDA under an Emergency Use Authorization (EUA). This EUA will remain in effect (meaning this test can be used) for the duration of the COVID-19 declaration under Section 564(b)(1) of the Act, 21 U.S.C. section 360bbb-3(b)(1), unless the authorization is terminated or revoked.  Performed at [facility], [place] [address], [place], [place] [address]      Radiology Studies: No results found.  Scheduled Meds:  sodium chloride   Intravenous Once   atorvastatin  20 mg Oral Daily   finasteride  5 mg Oral Daily   insulin aspart  0-15 Units Subcutaneous TID WC   insulin aspart  0-5 Units Subcutaneous QHS   mirabegron ER  25 mg Oral Daily   pantoprazole  40 mg Oral BID AC   sodium chloride flush  3 mL Intravenous Q12H   traZODone  50 mg Oral  QHS   Continuous Infusions:   LOS: 2 days   Darliss Cheney, MD [facility]  02/17/2022, 1:26 PM   *Please note that this is a verbal dictation therefore any spelling or grammatical errors are due to the "[place]" system interpretation.  Please page via [place] and do not message via secure chat for urgent patient care matters. Secure chat can be used for non urgent patient care matters.  How to contact the [facility] Attending or Consulting provider [place] or covering provider during after hours [place], for this patient?  Check the care team in [facility] and look for a) attending/consulting TRH provider listed and b) the [facility] team listed. Page or secure chat [phone]. Log into [email or web address] and use [place] universal password to access. If you do not have the password, please contact the hospital operator. Locate the [facility] provider you are looking for under [facility] and page to a number that you can be directly reached. If you still have difficulty reaching the provider, please page the [facility] (Director on Call) for the Hospitalists listed on amion for assistance.

## 2022-02-17 NOTE — Progress Notes (Signed)
Physical Therapy Treatment Patient Details Name: Larry [name] MRN: [id number] DOB: Oct 07, 1950 Today's Date: 02/17/2022   History of Present Illness Pt is a 71 y.o. M who presents 02/15/2022 with progressively worsening weakness over last 2 weeks. Admitted with symptomatic anemia secondary to GIB/nonbleeding gastric and duodenal ulcer. Significant PMH: HTN, HLD, DM2, MS, SCI.    PT Comments     Pt agreeable to participate in physical therapy session. Reports continued bilateral hand and BLE numbness/weakness in comparison to his baseline. Pt states he was unable to feed himself with built up utensil. This is a gross change from his baseline in which he is independent with ADL's and modI with wheelchair transfers. Pt requiring moderate assist for lateral scoot transfer from bed to chair. Will continue to address sitting balance, strengthening, and functional mobility.    Recommendations for follow up therapy are one component of a multi-disciplinary discharge planning process, led by the attending physician.  Recommendations may be updated based on patient status, additional functional criteria and insurance authorization.  Follow Up Recommendations  Skilled nursing-short term rehab (<3 hours/day) Can patient physically be transported by private vehicle: No   Assistance Recommended at Discharge Intermittent Supervision/Assistance  Patient can return home with the following A lot of help with walking and/or transfers;A lot of help with bathing/dressing/bathroom   Equipment Recommendations  Other (comment) (slide board)    Recommendations for Other Services       Precautions / Restrictions Precautions Precautions: Fall Restrictions Weight Bearing Restrictions: No     Mobility  Bed Mobility Overal bed mobility: Needs Assistance Bed Mobility: Supine to Sit     Supine to sit: Min assist     General bed mobility comments: Assist for LLE management off edge of bed. Pt progressing  from long sitting position and then posteriorly propping    Transfers Overall transfer level: Needs assistance Equipment used: None Transfers: Bed to chair/wheelchair/BSC            Lateral/Scoot Transfers: Mod assist General transfer comment: ModA to perform lateral scoot transfer from bed to w/c with use of bed pad to guide hips. Pt requiring multiple scoots and assist for BLE positioning    Ambulation/Gait                   Stairs             Wheelchair Mobility    Modified Rankin (Stroke Patients Only)       Balance Overall balance assessment: Needs assistance Sitting-balance support: Feet supported Sitting balance-Leahy Scale: Fair Sitting balance - Comments: progressing to no UE support with supervision                                    Cognition Arousal/Alertness: Awake/alert Behavior During Therapy: WFL for tasks assessed/performed Overall Cognitive Status: Within Functional Limits for tasks assessed                                          Exercises Other Exercises Other Exercises: Sitting: functional reaching with BUE's    General Comments        Pertinent Vitals/Pain Pain Assessment Pain Assessment: No/denies pain    Home Living  Prior Function            PT Goals (current goals can now be found in the care plan section) Acute Rehab PT Goals Patient Stated Goal: improve strength Potential to Achieve Goals: Good Progress towards PT goals: Progressing toward goals    Frequency    Min 3X/week      PT Plan Discharge plan needs to be updated    Co-evaluation              AM-PAC PT "6 Clicks" Mobility   Outcome Measure  Help needed turning from your back to your side while in a flat bed without using bedrails?: A Little Help needed moving from lying on your back to sitting on the side of a flat bed without using bedrails?: A Little Help needed  moving to and from a bed to a chair (including a wheelchair)?: A Lot Help needed standing up from a chair using your arms (e.g., wheelchair or bedside chair)?: Total Help needed to walk in hospital room?: Total Help needed climbing 3-5 steps with a railing? : Total 6 Click Score: 11    End of Session Equipment Utilized During Treatment: Gait belt Activity Tolerance: Patient tolerated treatment well Patient left: with call bell/phone within reach;in chair Nurse Communication: Mobility status PT Visit Diagnosis: Other abnormalities of gait and mobility (R26.89);History of falling (Z91.81)     Time: 1610-9604 PT Time Calculation (min) (ACUTE ONLY): 26 min  Charges:  $Therapeutic Activity: 23-37 mins                     Wyona Almas, PT, DPT Acute Rehabilitation Services Office [phone]    Deno Etienne 02/17/2022, 3:57 PM

## 2022-02-17 NOTE — Progress Notes (Signed)
While cleaning him from a bloody Bm- brick red in color noted to be oozing same brick red from rectum

## 2022-02-17 NOTE — TOC Initial Note (Signed)
Transition of Care [facility]) - Initial/Assessment Note    Patient Details  Name: Larry [name] MRN: [id number] Date of Birth: 1950/08/23  [facility] of [facility]) CM/SW Contact:    Coralee Pesa, [place] Phone Number: 02/17/2022, 3:43 PM  Clinical Narrative:                 CSW met with pt at bedside to discuss DC plans. Pt is agreeable to SNF and has been to [facility] before, but would prefer to go to [facility]. Pt lives alone in an apartment and plans to discharge back after rehab. Pt requests ambulance transport at DC. Pt requests CSW update his daughter. CSW spoke with [facility] who is also agreeable to SNF, all questions answered. CSW to provide APFM resource to daughter tomorrow. TOC will continue to follow for DC needs.  Expected Discharge Plan: Skilled Nursing Facility Barriers to Discharge: Insurance Authorization, SNF Pending bed offer   Patient Goals and CMS Choice Patient states their goals for this hospitalization and ongoing recovery are:: Pt is agreeable to SNF CMS Medicare.gov Compare Post Acute Care list provided to:: Patient Choice offered to / list presented to : Patient  Expected Discharge Plan and Services Expected Discharge Plan: [place] Acute Care Choice: [place] Living arrangements for the past 2 months: Apartment                                      Prior Living Arrangements/Services Living arrangements for the past 2 months: Apartment Lives with:: Self Patient language and need for interpreter reviewed:: Yes Do you feel safe going back to the place where you live?: Yes      Need for Family Participation in Patient Care: No (Comment) Care giver support system in place?: Yes (comment)   Criminal Activity/Legal Involvement Pertinent to Current Situation/Hospitalization: No - Comment as needed  Activities of Daily Living Home Assistive Devices/Equipment: Wheelchair, Grab bars in shower, Eyeglasses ADL  Screening (condition at time of admission) Patient's cognitive ability adequate to safely complete daily activities?: Yes Is the patient deaf or have difficulty hearing?: No Does the patient have difficulty seeing, even when wearing glasses/contacts?: No Does the patient have difficulty concentrating, remembering, or making decisions?: No Patient able to express need for assistance with ADLs?: Yes Does the patient have difficulty dressing or bathing?: No Independently performs ADLs?: Yes (appropriate for developmental age) Does the patient have difficulty walking or climbing stairs?: Yes Weakness of Legs: Both (Paraplegia) Weakness of Arms/Hands: None  Permission Sought/Granted Permission sought to share information with : Family Supports Permission granted to share information with : Yes, Verbal Permission Granted  Share Information with NAME: [name]     Permission granted to share info w Relationship: Daughter     Emotional Assessment Appearance:: Appears stated age Attitude/Demeanor/Rapport: Engaged Affect (typically observed): Appropriate Orientation: : Oriented to Self, Oriented to Place, Oriented to  Time, Oriented to Situation Alcohol / Substance Use: Not Applicable Psych Involvement: No (comment)  Admission diagnosis:  Acute blood loss anemia [D62] Symptomatic anemia [D64.9] Gastrointestinal hemorrhage, unspecified gastrointestinal hemorrhage type [K92.2] Anemia, unspecified type [D64.9] Patient Active Problem List   Diagnosis Date Noted   Pressure injury of skin 02/16/2022   Acute gastric ulcer with hemorrhage    Duodenal ulcer    ABLA (acute blood loss anemia) 02/15/2022   Symptomatic anemia 02/15/2022   Acute encephalopathy  02/15/2022   GI bleed 02/15/2022   Controlled type 2 DM with peripheral circulatory disorder ([place]) 01/12/2022   History of pulmonary embolism 07/15/2021   Bilateral leg edema 02/09/2020   Hyperlipidemia associated with type 2 diabetes  mellitus ([place]) 11/09/2019   Benign prostatic hyperplasia with lower urinary tract symptoms 09/14/2019   Cervical stenosis of spinal canal 05/09/2019   Thoracic spinal stenosis 05/09/2019   Gait abnormality 08/08/2018   Spinal stenosis 08/08/2018   Iron deficiency anemia 02/09/2018   Bowel incontinence 01/01/2017   Vitamin D deficiency 01/11/2016   GERD (gastroesophageal reflux disease) 01/11/2016   Spinal stenosis, lumbar region, with neurogenic claudication 01/10/2016   Spinal stenosis of lumbar region with radiculopathy 01/10/2016   Elective surgery    Tachycardia    AKI (acute kidney injury) ([place])    Peripheral neuropathy    Spondylolisthesis of lumbar region 12/30/2015   CTS (carpal tunnel syndrome) 09/26/2015   Lumbar stenosis 09/26/2015   Weakness 09/05/2015   Spinal stenosis in cervical region 06/12/2015   Leukocytopenia 05/22/2013   History of spinal cord injury 12/12/2011   Paraplegia ([place]) 12/11/2011   Altered mental status 12/11/2011   DM (diabetes mellitus) ([place]) 12/11/2011   Overactive bladder 12/11/2011   MS (multiple sclerosis) ([place])    Hypertension    PCP:  Flossie Buffy, NP Pharmacy:   [facility] ([place], [place] [address] [place] [address] Phone: [phone] Fax: [phone]  [facility] ([facility]) - [place], [place] [address] Phone: [phone] Fax: [place] [address] [place]), [place] - [place] [address] [place] ([place]) [place] [address] Phone: [phone] Fax: [phone]     Social Determinants of Health (SDOH) Interventions Housing Interventions: Intervention Not Indicated  Readmission Risk Interventions     No data to display

## 2022-02-18 DIAGNOSIS — L89152 Pressure ulcer of sacral region, stage 2: Secondary | ICD-10-CM | POA: Diagnosis not present

## 2022-02-18 DIAGNOSIS — Z981 Arthrodesis status: Secondary | ICD-10-CM | POA: Diagnosis not present

## 2022-02-18 DIAGNOSIS — R52 Pain, unspecified: Secondary | ICD-10-CM | POA: Diagnosis not present

## 2022-02-18 DIAGNOSIS — M6281 Muscle weakness (generalized): Secondary | ICD-10-CM | POA: Diagnosis not present

## 2022-02-18 DIAGNOSIS — M4726 Other spondylosis with radiculopathy, lumbar region: Secondary | ICD-10-CM | POA: Diagnosis not present

## 2022-02-18 DIAGNOSIS — D62 Acute posthemorrhagic anemia: Secondary | ICD-10-CM | POA: Diagnosis not present

## 2022-02-18 DIAGNOSIS — K269 Duodenal ulcer, unspecified as acute or chronic, without hemorrhage or perforation: Secondary | ICD-10-CM | POA: Diagnosis not present

## 2022-02-18 DIAGNOSIS — G8220 Paraplegia, unspecified: Secondary | ICD-10-CM | POA: Diagnosis not present

## 2022-02-18 DIAGNOSIS — K922 Gastrointestinal hemorrhage, unspecified: Secondary | ICD-10-CM | POA: Diagnosis not present

## 2022-02-18 DIAGNOSIS — K250 Acute gastric ulcer with hemorrhage: Secondary | ICD-10-CM | POA: Diagnosis not present

## 2022-02-18 DIAGNOSIS — M4316 Spondylolisthesis, lumbar region: Secondary | ICD-10-CM | POA: Diagnosis not present

## 2022-02-18 DIAGNOSIS — E785 Hyperlipidemia, unspecified: Secondary | ICD-10-CM | POA: Diagnosis not present

## 2022-02-18 DIAGNOSIS — K274 Chronic or unspecified peptic ulcer, site unspecified, with hemorrhage: Secondary | ICD-10-CM | POA: Diagnosis not present

## 2022-02-18 DIAGNOSIS — R531 Weakness: Secondary | ICD-10-CM | POA: Diagnosis not present

## 2022-02-18 DIAGNOSIS — I10 Essential (primary) hypertension: Secondary | ICD-10-CM | POA: Diagnosis not present

## 2022-02-18 DIAGNOSIS — K254 Chronic or unspecified gastric ulcer with hemorrhage: Secondary | ICD-10-CM | POA: Diagnosis not present

## 2022-02-18 DIAGNOSIS — D649 Anemia, unspecified: Secondary | ICD-10-CM | POA: Diagnosis not present

## 2022-02-18 DIAGNOSIS — R339 Retention of urine, unspecified: Secondary | ICD-10-CM | POA: Diagnosis not present

## 2022-02-18 DIAGNOSIS — E118 Type 2 diabetes mellitus with unspecified complications: Secondary | ICD-10-CM | POA: Diagnosis not present

## 2022-02-18 DIAGNOSIS — G35 Multiple sclerosis: Secondary | ICD-10-CM | POA: Diagnosis not present

## 2022-02-18 DIAGNOSIS — E119 Type 2 diabetes mellitus without complications: Secondary | ICD-10-CM | POA: Diagnosis not present

## 2022-02-18 DIAGNOSIS — Z7401 Bed confinement status: Secondary | ICD-10-CM | POA: Diagnosis not present

## 2022-02-18 LAB — CBC WITH DIFFERENTIAL/PLATELET
Abs Immature Granulocytes: 0.05 10*3/uL (ref 0.00–0.07)
Basophils Absolute: 0 10*3/uL (ref 0.0–0.1)
Basophils Relative: 0 %
Eosinophils Absolute: 0.1 10*3/uL (ref 0.0–0.5)
Eosinophils Relative: 1 %
HCT: 28.9 % — ABNORMAL LOW (ref 39.0–52.0)
Hemoglobin: 9.9 g/dL — ABNORMAL LOW (ref 13.0–17.0)
Immature Granulocytes: 1 %
Lymphocytes Relative: 8 %
Lymphs Abs: 0.6 10*3/uL — ABNORMAL LOW (ref 0.7–4.0)
MCH: 31.4 pg (ref 26.0–34.0)
MCHC: 34.3 g/dL (ref 30.0–36.0)
MCV: 91.7 fL (ref 80.0–100.0)
Monocytes Absolute: 0.4 10*3/uL (ref 0.1–1.0)
Monocytes Relative: 5 %
Neutro Abs: 5.6 10*3/uL (ref 1.7–7.7)
Neutrophils Relative %: 85 %
Platelets: 308 10*3/uL (ref 150–400)
RBC: 3.15 MIL/uL — ABNORMAL LOW (ref 4.22–5.81)
RDW: 14.8 % (ref 11.5–15.5)
WBC: 6.7 10*3/uL (ref 4.0–10.5)
nRBC: 0.3 % — ABNORMAL HIGH (ref 0.0–0.2)

## 2022-02-18 LAB — BASIC METABOLIC PANEL
Anion gap: 13 (ref 5–15)
BUN: 13 mg/dL (ref 8–23)
CO2: 21 mmol/L — ABNORMAL LOW (ref 22–32)
Calcium: 9.2 mg/dL (ref 8.9–10.3)
Chloride: 104 mmol/L (ref 98–111)
Creatinine, Ser: 1.02 mg/dL (ref 0.61–1.24)
GFR, Estimated: >60 mL/min (ref >60)
Glucose, Bld: 136 mg/dL — ABNORMAL HIGH (ref 70–99)
Potassium: 3.8 mmol/L (ref 3.5–5.1)
Sodium: 138 mmol/L (ref 135–145)

## 2022-02-18 LAB — GLUCOSE, CAPILLARY
Glucose-Capillary: 121 mg/dL — ABNORMAL HIGH (ref 70–99)
Glucose-Capillary: 127 mg/dL — ABNORMAL HIGH (ref 70–99)

## 2022-02-18 IMAGING — DX DG CHEST 2V
3 series · 3 of 3 positions shown · non-contrast
Comparison: One

CLINICAL DATA: Re-evaluate pneumonia

EXAM:
CHEST - 2 VIEW

[chest ap]
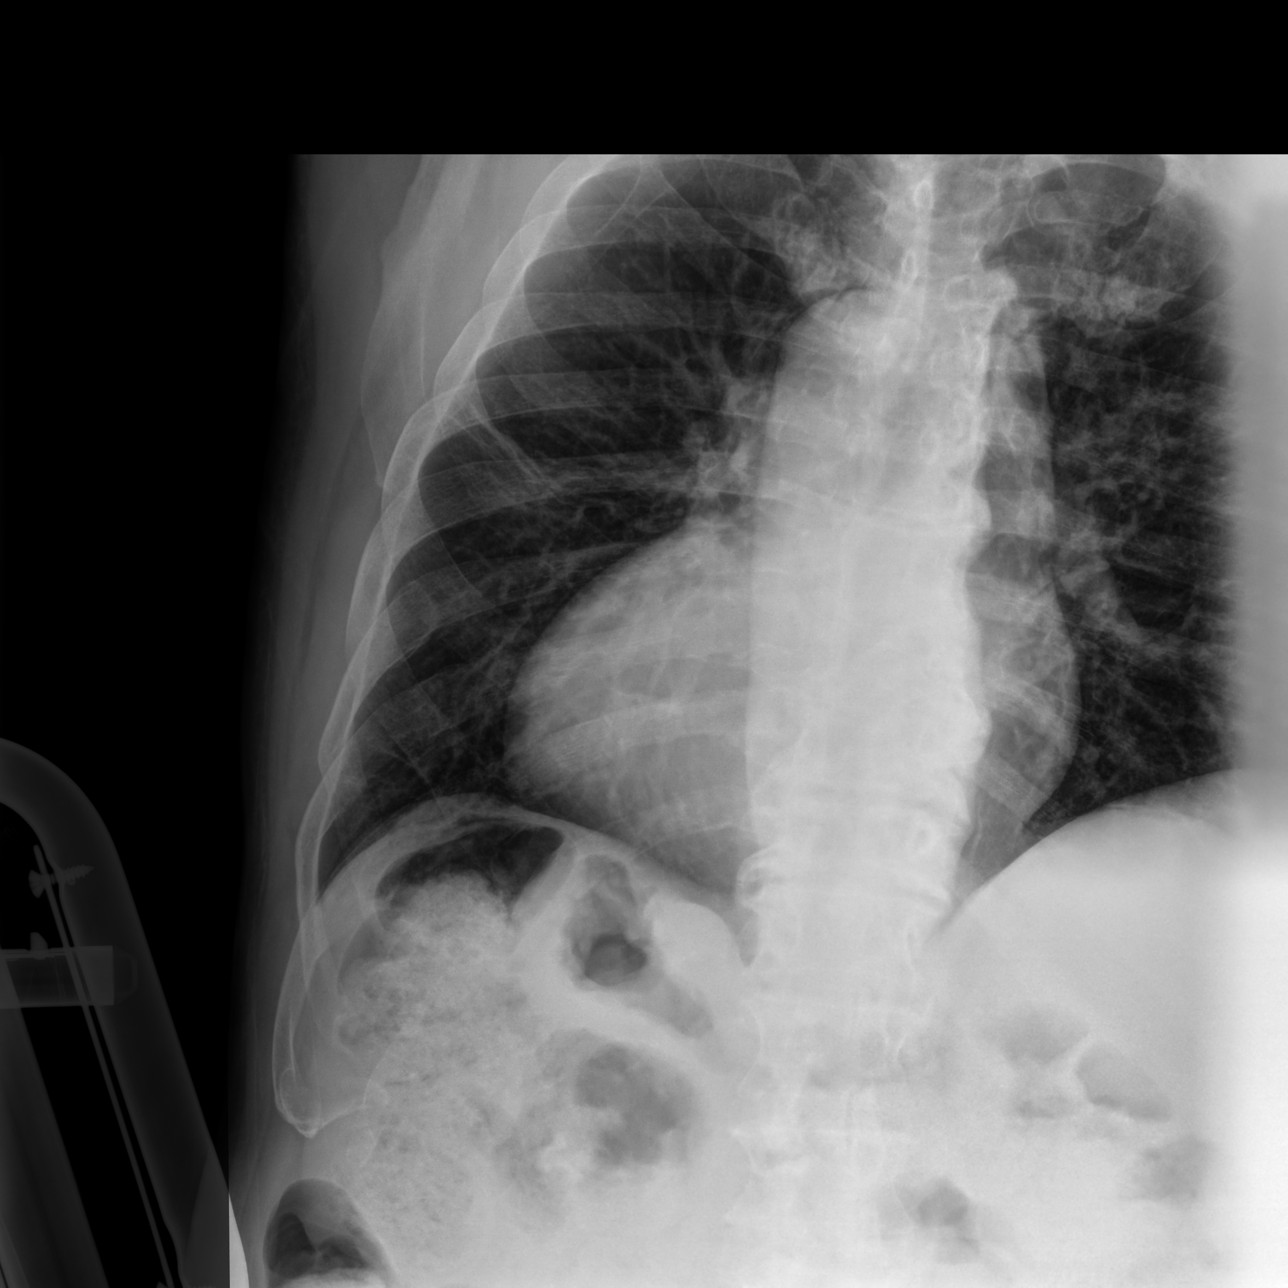

[chest pa]
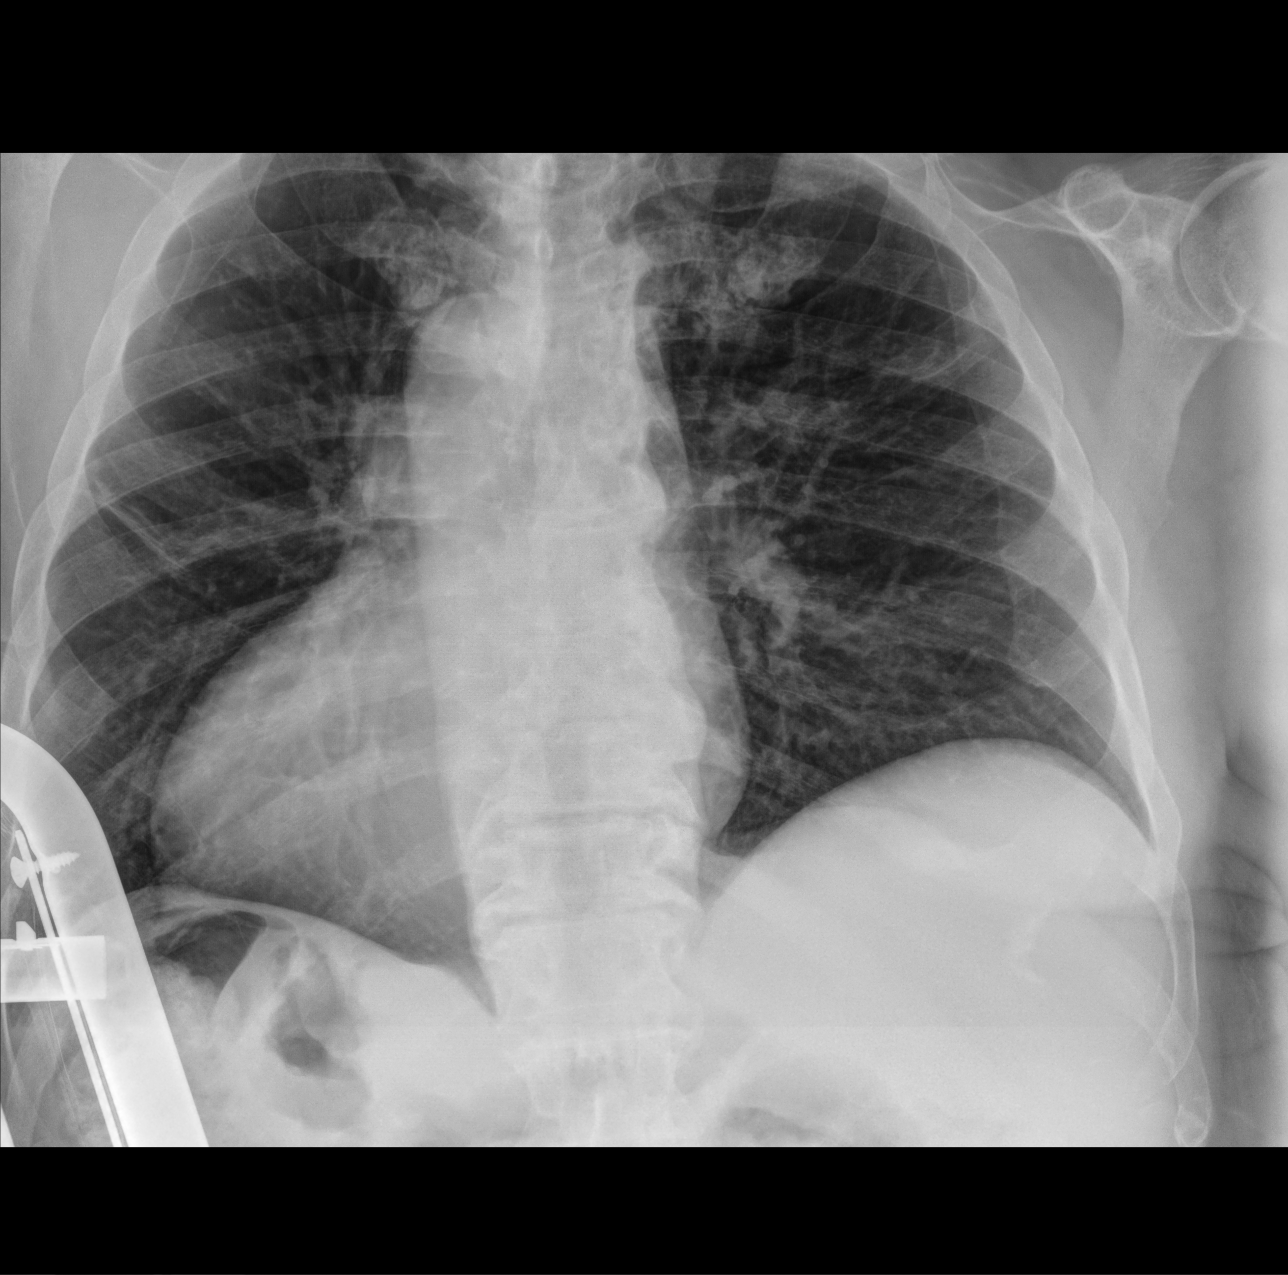

[chest lat]
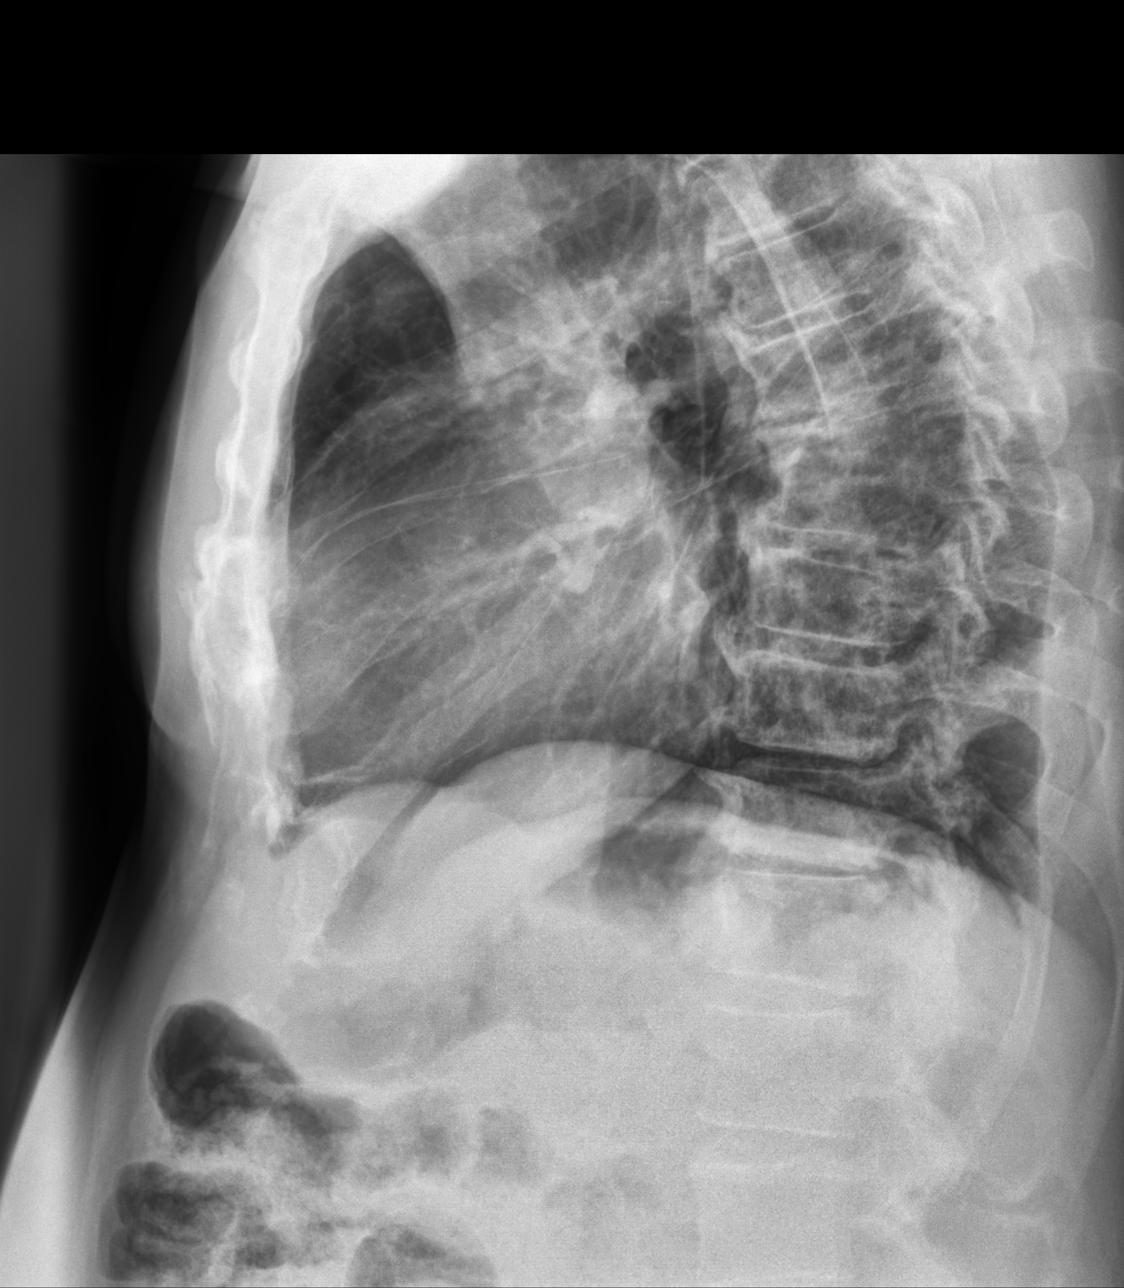

[3 of 3 positions shown; findings below may reference images not displayed]

FINDINGS: Mild cardiomegaly. Near complete resolution of previously seen
extensive bibasilar heterogeneous airspace disease, with minimal
residual bandlike scarring or atelectasis of the peripheral left
midlung. Disc degenerative disease of the thoracic spine.
IMPRESSION: Near complete resolution of previously seen extensive bibasilar
heterogeneous airspace disease, with minimal residual bandlike
scarring or atelectasis of the peripheral left midlung.

## 2022-02-18 MED ORDER — PANTOPRAZOLE SODIUM 40 MG PO TBEC: 40.0000 mg | DELAYED_RELEASE_TABLET | Freq: Two times a day (BID) | ORAL | 0 refills | Status: DC

## 2022-02-18 NOTE — Care Management Important Message (Signed)
Important Message  Patient Details  Name: Larry [name] MRN: [id number] Date of Birth: Oct 22, 1950   Medicare Important Message Given:  Yes     Hannah Beat 02/18/2022, 11:03 AM

## 2022-02-18 NOTE — TOC Transition Note (Signed)
Transition of Care [facility]) - CM/SW Discharge Note   Patient Details  Name: Larry [name] MRN: [id number] Date of Birth: 14-Aug-1950  [facility]) CM/SW Contact:  Coralee Pesa, [place] Phone Number: 02/18/2022, 2:20 PM   Clinical Narrative:    Pt to be transported to [facility] via [place]. Nurse to call report to [phone]. Rm# [phone]    Final next level of care: Skilled Nursing Facility Barriers to Discharge: Barriers Resolved   Patient Goals and CMS Choice Patient states their goals for this hospitalization and ongoing recovery are:: Pt is agreeable to SNF CMS Medicare.gov Compare Post Acute Care list provided to:: Patient Choice offered to / list presented to : Patient  Discharge Placement              Patient chooses bed at: [facility] Patient to be transferred to facility by: [facility] Name of family member notified: Kaylyn Patient and family notified of of transfer: 02/18/22  Discharge Plan and Services     Post Acute Care Choice: [facility]                               Social Determinants of Health (SDOH) Interventions Housing Interventions: Intervention Not Indicated   Readmission Risk Interventions     No data to display

## 2022-02-18 NOTE — Discharge Summary (Signed)
PatientPhysician Discharge Summary  Larry [name] JSE:[id number] DOB: [birth year] DOA: 02/15/2022  PCP: Flossie Buffy, NP  Admit date: 02/15/2022 Discharge date: 02/18/2022 30 Day Unplanned Readmission Risk Score    Flowsheet Row ED to Hosp-Admission (Current) from 02/15/2022 in [facility]  30 Day Unplanned Readmission Risk Score (%) 10.57 Filed at 02/18/2022 1200       This score is the patient's risk of an unplanned readmission within 30 days of being discharged (0 -100%). The score is based on dignosis, age, lab data, medications, orders, and past utilization.   Low:  0-14.9   Medium: 15-21.9   High: 22-29.9   Extreme: 30 and above          Admitted From: Home Disposition: SNF  Recommendations for Outpatient Follow-up:  Follow up with PCP in 1-2 weeks Please obtain BMP/CBC in one week Follow-up with GI in 10 weeks for repeat EGD Avoid aspirin, ibuprofen, naproxen, or other non-steroidal anti-inflammatory drugs including meloxicam Please follow up with your PCP on the following pending results: Unresulted Labs (From admission, onward)    None         Home Health: None Equipment/Devices: None  Discharge Condition: Stable CODE STATUS: Full code Diet recommendation: Cardiac  Subjective: Seen and examined.  He has no complaints.  He says that his weakness in the upper extremities is ongoing for some time due to his MS.  He is not concerned.  Brief/Interim Summary: Larry [name] is a 71 y.o. male with medical history significant of hypertension, hyperlipidemia, diabetes mellitus type 2, multiple sclerosis, spinal cord injury, wheelchair-bound who presented with complaints of progressively worsening weakness over the last 2 weeks.  He had 2 falls while transferring here recently.  No reports of loss of consciousness or head trauma.  Some history of black tarry stools since last 2 weeks.  Patient takes meloxicam daily for joint pain  as well as aspirin 81 mg.  Denies any other NSAIDs. Patient's last colonoscopy had been with Dr. Cristina Gong in 2005 which noted no acute abnormality and reports doing a Cologuard test which was negative last year.  Upon arrival to ED, he was hemodynamically stable. Labs significant for hemoglobin 4.5, BUN 37, creatinine 1.43, and glucose 182.  CT scan of the head noted no acute abnormality.  Stool guaiacs were noted to be positive.  He was started on Protonix drip and admitted to hospital service.  Details as below.   Symptomatic anemia secondary to upper GI bleed/nonbleeding gastric and duodenal ulcer: Presented with hemoglobin 4.5, but previously had been 13.1 on 8/14.  Received 3 units of PRBC transfusion. Underwent EGD by Dr. Tarri Glenn 02/16/2022 and was found to have nonbleeding gastric ulcers and duodenal ulcer with no stigmata of bleeding.  Gastric biopsy showed reactive gastropathy with mild chronic gastritis.  Negative for H. pylori, intestinal metaplasia, dysplasia and carcinoma.  They recommended twice daily PPI for at least 10 weeks and then once daily and follow-up with GI in 10 weeks for repeat EGD and to avoid all sorts of NSAIDs including meloxicam and aspirin..  Hemoglobin has remained stable.   Acute kidney injury: Secondary to hypoperfusion due to severe anemia.  Now resolved.   Controlled diabetes mellitus type 2, without long-term use of insulin On admission glucose elevated at 182.  Last hemoglobin A1c 6.6 on 8/14.  Home medication regimen includes metformin.  Will do metformin at discharge.   Transient hypotension: Blood pressures noted to be as low as 120/47.  Home blood pressure medication regimen includes olmesartan-amlodipine-hydrochlorothiazide 40-10-12.5 mg daily.  All medications are on hold since admission and blood pressure has remained stable and based on this data, I am discontinuing antihypertensives at the discharge.   Acute encephalopathy: Patient reported intermittent  episodes of confusion.  CT scan of the head negative for any acute abnormality.  He is fully alert and oriented since admission.   MS spinal stenosis paraplegia wheelchair-bound/progressive upper extremity weakness Patient ambulates with use of a wheelchair, but have been having issues with transfers due to weakness with report of 2 recent falls. -Continue baclofen and pregabalin once blood pressure stable.  He says that he does not think that he is capable of living by himself and that his daughter may have to live with him.  Seen by PT OT but they recommended CIR.  CIR declined him.  He is interested in going to SNF.  TOC is aware and working on placement.  He is waiting for insurance authorization.  If obtained today, he will be discharged today.  He also has residual upper extremity weakness.  Hopefully he will have some recovery with rehabilitation.   BPH: Continue finasteride.  Discharge Diagnoses:  Principal Problem:   Symptomatic anemia Active Problems:   GI bleed   AKI (acute kidney injury) ([place])   Controlled type 2 DM with peripheral circulatory disorder (HCC)   Acute encephalopathy   MS (multiple sclerosis) (HCC)   GERD (gastroesophageal reflux disease)   Benign prostatic hyperplasia with lower urinary tract symptoms   ABLA (acute blood loss anemia)   Pressure injury of skin   Acute gastric ulcer with hemorrhage   Duodenal ulcer    Discharge Instructions   Allergies as of 02/18/2022       Reactions   Rosuvastatin Other (See Comments)   Leg swelling        Medication List     STOP taking these medications    aspirin 81 MG tablet   meloxicam 15 MG tablet Commonly known as: MOBIC   Olmesartan-amLODIPine-HCTZ 40-10-12.5 MG Tabs   omeprazole 20 MG capsule Commonly known as: PRILOSEC       TAKE these medications    Accu-Chek Aviva Plus test strip Generic drug: glucose blood TEST FASTING BLOOD SUGAR  ONCE DAILY IN THE MORNING   Accu-Chek Aviva  Soln   Accu-Chek FastClix Lancet Kit 1 Units by Does not apply route daily before breakfast.   Accu-Chek FastClix Lancets Misc   atorvastatin 20 MG tablet Commonly known as: LIPITOR TAKE 1 TABLET BY MOUTH DAILY   baclofen 20 MG tablet Commonly known as: LIORESAL Take 1 tablet (20 mg total) by mouth 3 (three) times daily. What changed:  how much to take when to take this additional instructions   Depend Adjustable Underwear Lg Misc Use as needed.   ferrous sulfate 325 (65 FE) MG tablet Take 1 tablet (325 mg total) by mouth daily with breakfast.   finasteride 5 MG tablet Commonly known as: PROSCAR Take 1 tablet (5 mg total) by mouth daily.   metFORMIN 750 MG 24 hr tablet Commonly known as: GLUCOPHAGE-XR Take 1 tablet (750 mg total) by mouth daily with breakfast.   mirabegron ER 25 MG Tb24 tablet Commonly known as: MYRBETRIQ Take 1 tablet (25 mg total) by mouth daily.   multivitamin tablet Take 1 tablet by mouth daily.   pantoprazole 40 MG tablet Commonly known as: PROTONIX Take 1 tablet (40 mg total) by mouth 2 (two) times daily before a  meal.   pregabalin 100 MG capsule Commonly known as: LYRICA Take 1 capsule (100 mg total) by mouth 2 (two) times daily.   traZODone 50 MG tablet Commonly known as: DESYREL Take 1 tablet (50 mg total) by mouth at bedtime.        Contact information for follow-up providers     Nche, Charlene Brooke, NP Follow up in 1 week(s).   Specialty: Internal Medicine Contact information: [address] [place] [address] [phone]         Nche, Charlene Brooke, NP Follow up.   Specialty: Internal Medicine Contact information: [address] [address] [phone]         Thornton Park, MD Follow up in 10 week(s).   Specialty: Gastroenterology Contact information: [place] [address] [phone]              Contact information for after-discharge care     Destination      [facility] Preferred SNF .   Service: Skilled Nursing Contact information: [address] [place] [address] [phone]                    Allergies  Allergen Reactions   Rosuvastatin Other (See Comments)    Leg swelling    Consultations: GI   Procedures/Studies: CT Head Wo Contrast  Result Date: 02/15/2022 CLINICAL DATA:  Neuro deficit.  Stroke suspected. EXAM: CT HEAD WITHOUT CONTRAST TECHNIQUE: Contiguous axial images were obtained from the base of the skull through the vertex without intravenous contrast. RADIATION DOSE REDUCTION: This exam was performed according to the departmental dose-optimization program which includes automated exposure control, adjustment of the mA and/or kV according to patient size and/or use of iterative reconstruction technique. COMPARISON:  Brain MRI 07/01/2018 FINDINGS: Brain: No evidence of acute infarction, hemorrhage, hydrocephalus, extra-axial collection or mass lesion/mass effect. There is mild patchy low-attenuation within the subcortical and periventricular white matter compatible with chronic microvascular disease. Vascular: No hyperdense vessel or unexpected calcification. Skull: Normal. Negative for fracture or focal lesion. Sinuses/Orbits: No acute finding. Other: None. IMPRESSION: 1. No acute intracranial abnormalities. 2. Chronic small vessel ischemic disease. Electronically Signed   By: Kerby Moors M.D.   On: 02/15/2022 12:02   DG Chest Portable 1 View  Result Date: 02/15/2022 CLINICAL DATA:  Weakness. EXAM: PORTABLE CHEST 1 VIEW COMPARISON:  CT 05/23/2020 FINDINGS: There is mild cardiac enlargement. No pleural effusion or edema. No airspace opacities identified. Scarring noted in the left midlung. IMPRESSION: No acute cardiopulmonary abnormalities. Electronically Signed   By: Kerby Moors M.D.   On: 02/15/2022 11:57     Discharge Exam: Vitals:   02/17/22 2100 02/18/22 0806  BP:  135/87   Pulse: 91   Resp: 19   Temp:  98.2 F (36.8 C)  SpO2: 100%    Vitals:   02/17/22 0852 02/17/22 1323 02/17/22 2100 02/18/22 0806  BP: (!) 139/93 137/81 135/87   Pulse: 86 87 91   Resp: '18 17 19   ' Temp:  98 F (36.7 C)  98.2 F (36.8 C)  TempSrc:  Oral  Oral  SpO2: 96% 100% 100%   Weight:      Height:        General: Pt is alert, awake, not in acute distress Cardiovascular: RRR, S1/S2 +, no rubs, no gallops Respiratory: CTA bilaterally, no wheezing, no rhonchi Abdominal: Soft, NT, ND, bowel sounds + Extremities: no edema, no cyanosis  The results of significant diagnostics from this hospitalization (including imaging, microbiology, ancillary and laboratory) are listed below for reference.     Microbiology: Recent Results (from the past 240 hour(s))  Resp Panel by RT-PCR (Flu A&B, Covid) Anterior Nasal Swab     Status: None   Collection Time: 02/15/22 11:13 AM   Specimen: Anterior Nasal Swab  Result Value Ref Range Status   SARS Coronavirus 2 by RT PCR NEGATIVE NEGATIVE Final    Comment: (NOTE) SARS-CoV-2 target nucleic acids are NOT DETECTED.  The SARS-CoV-2 RNA is generally detectable in upper respiratory specimens during the acute phase of infection. The lowest concentration of SARS-CoV-2 viral copies this assay can detect is 138 copies/mL. A negative result does not preclude SARS-Cov-2 infection and should not be used as the sole basis for treatment or other patient management decisions. A negative result may occur with  improper specimen collection/handling, submission of specimen other than nasopharyngeal swab, presence of viral mutation(s) within the areas targeted by this assay, and inadequate number of viral copies(<138 copies/mL). A negative result must be combined with clinical observations, patient history, and epidemiological information. The expected result is Negative.  Fact Sheet for Patients:   EntrepreneurPulse.com.au  Fact Sheet for Healthcare Providers:  IncredibleEmployment.be  This test is no t yet approved or cleared by the [place] FDA and  has been authorized for detection and/or diagnosis of SARS-CoV-2 by FDA under an Emergency Use Authorization (EUA). This EUA will remain  in effect (meaning this test can be used) for the duration of the COVID-19 declaration under Section 564(b)(1) of the Act, 21 U.S.C.section 360bbb-3(b)(1), unless the authorization is terminated  or revoked sooner.       Influenza A by PCR NEGATIVE NEGATIVE Final   Influenza B by PCR NEGATIVE NEGATIVE Final    Comment: (NOTE) The Xpert Xpress SARS-CoV-2/FLU/RSV plus assay is intended as an aid in the diagnosis of influenza from Nasopharyngeal swab specimens and should not be used as a sole basis for treatment. Nasal washings and aspirates are unacceptable for Xpert Xpress SARS-CoV-2/FLU/RSV testing.  Fact Sheet for Patients: EntrepreneurPulse.com.au  Fact Sheet for Healthcare Providers: IncredibleEmployment.be  This test is not yet approved or cleared by the [place] FDA and has been authorized for detection and/or diagnosis of SARS-CoV-2 by FDA under an Emergency Use Authorization (EUA). This EUA will remain in effect (meaning this test can be used) for the duration of the COVID-19 declaration under Section 564(b)(1) of the Act, 21 U.S.C. section 360bbb-3(b)(1), unless the authorization is terminated or revoked.  Performed at [facility], [place] [address], [place], [place] [address]      Labs: BNP (last 3 results) No results for input(s): "BNP" in the last 8760 hours. Basic Metabolic Panel: Recent Labs  Lab 02/15/22 1052 02/16/22 1640 02/18/22 0632  NA 143 140 138  K 3.8 4.0 3.8  CL 109 107 104  CO2 25 24 21*  GLUCOSE 182* 145* 136*  BUN 37* 18 13  CREATININE 1.43* 1.10 1.02  CALCIUM  9.0 9.2 9.2   Liver Function Tests: Recent Labs  Lab 02/15/22 1052  AST 26  ALT 18  ALKPHOS 55  BILITOT 0.3  PROT 5.8*  ALBUMIN 3.6   No results for input(s): "LIPASE", "AMYLASE" in the last 168 hours. No results for input(s): "AMMONIA" in the last 168 hours. CBC: Recent Labs  Lab 02/15/22 1052 02/16/22 0157 02/16/22 1256 02/16/22 1640 02/17/22 1035 02/18/22 0632  WBC 4.9 5.7 5.7 7.0  6.4 6.7  NEUTROABS 3.9  --   --   --   --  5.6  HGB 4.5* 8.6* 9.1* 9.9* 10.0* 9.9*  HCT 15.4* 24.5* 27.3* 28.8* 29.0* 28.9*  MCV 104.8* 90.7 93.5 91.4 92.4 91.7  PLT 306 271 278 300 304 308   Cardiac Enzymes: No results for input(s): "CKTOTAL", "CKMB", "CKMBINDEX", "TROPONINI" in the last 168 hours. BNP: Invalid input(s): "POCBNP" CBG: Recent Labs  Lab 02/17/22 1206 02/17/22 1621 02/17/22 2218 02/18/22 0802 02/18/22 1144  GLUCAP 152* 134* 166* 121* 127*   D-Dimer No results for input(s): "DDIMER" in the last 72 hours. Hgb A1c No results for input(s): "HGBA1C" in the last 72 hours. Lipid Profile No results for input(s): "CHOL", "HDL", "LDLCALC", "TRIG", "CHOLHDL", "LDLDIRECT" in the last 72 hours. Thyroid function studies No results for input(s): "TSH", "T4TOTAL", "T3FREE", "THYROIDAB" in the last 72 hours.  Invalid input(s): "FREET3" Anemia work up No results for input(s): "VITAMINB12", "FOLATE", "FERRITIN", "TIBC", "IRON", "RETICCTPCT" in the last 72 hours. Urinalysis    Component Value Date/Time   COLORURINE YELLOW 02/15/2022 1552   APPEARANCEUR HAZY (A) 02/15/2022 1552   LABSPEC 1.012 02/15/2022 1552   PHURINE 6.0 02/15/2022 1552   GLUCOSEU NEGATIVE 02/15/2022 1552   GLUCOSEU NEGATIVE 02/20/2020 1137   HGBUR NEGATIVE 02/15/2022 1552   BILIRUBINUR NEGATIVE 02/15/2022 1552   KETONESUR NEGATIVE 02/15/2022 1552   PROTEINUR NEGATIVE 02/15/2022 1552   UROBILINOGEN 0.2 02/20/2020 1137   NITRITE NEGATIVE 02/15/2022 1552   LEUKOCYTESUR TRACE (A) 02/15/2022 1552   Sepsis  Labs Recent Labs  Lab 02/16/22 1256 02/16/22 1640 02/17/22 1035 02/18/22 0632  WBC 5.7 7.0 6.4 6.7   Microbiology Recent Results (from the past 240 hour(s))  Resp Panel by RT-PCR (Flu A&B, Covid) Anterior Nasal Swab     Status: None   Collection Time: 02/15/22 11:13 AM   Specimen: Anterior Nasal Swab  Result Value Ref Range Status   SARS Coronavirus 2 by RT PCR NEGATIVE NEGATIVE Final    Comment: (NOTE) SARS-CoV-2 target nucleic acids are NOT DETECTED.  The SARS-CoV-2 RNA is generally detectable in upper respiratory specimens during the acute phase of infection. The lowest concentration of SARS-CoV-2 viral copies this assay can detect is 138 copies/mL. A negative result does not preclude SARS-Cov-2 infection and should not be used as the sole basis for treatment or other patient management decisions. A negative result may occur with  improper specimen collection/handling, submission of specimen other than nasopharyngeal swab, presence of viral mutation(s) within the areas targeted by this assay, and inadequate number of viral copies(<138 copies/mL). A negative result must be combined with clinical observations, patient history, and epidemiological information. The expected result is Negative.  Fact Sheet for Patients:  EntrepreneurPulse.com.au  Fact Sheet for Healthcare Providers:  IncredibleEmployment.be  This test is no t yet approved or cleared by the [place] FDA and  has been authorized for detection and/or diagnosis of SARS-CoV-2 by FDA under an Emergency Use Authorization (EUA). This EUA will remain  in effect (meaning this test can be used) for the duration of the COVID-19 declaration under Section 564(b)(1) of the Act, 21 U.S.C.section 360bbb-3(b)(1), unless the authorization is terminated  or revoked sooner.       Influenza A by PCR NEGATIVE NEGATIVE Final   Influenza B by PCR NEGATIVE NEGATIVE Final    Comment:  (NOTE) The Xpert Xpress SARS-CoV-2/FLU/RSV plus assay is intended as an aid in the diagnosis of influenza from Nasopharyngeal swab specimens and should not be  used as a sole basis for treatment. Nasal washings and aspirates are unacceptable for Xpert Xpress SARS-CoV-2/FLU/RSV testing.  Fact Sheet for Patients: EntrepreneurPulse.com.au  Fact Sheet for Healthcare Providers: IncredibleEmployment.be  This test is not yet approved or cleared by the [place] FDA and has been authorized for detection and/or diagnosis of SARS-CoV-2 by FDA under an Emergency Use Authorization (EUA). This EUA will remain in effect (meaning this test can be used) for the duration of the COVID-19 declaration under Section 564(b)(1) of the Act, 21 U.S.C. section 360bbb-3(b)(1), unless the authorization is terminated or revoked.  Performed at [facility], [place] [address], [place], [place] [address]      Time coordinating discharge: Over 30 minutes  SIGNED:   Darliss Cheney, MD  [facility] 02/18/2022, 1:03 PM *Please note that this is a verbal dictation therefore any spelling or grammatical errors are due to the "[place] [employer]" system interpretation. If 7PM-7AM, please contact night-coverage [email or web address]

## 2022-02-18 NOTE — Progress Notes (Signed)
Occupational Therapy Treatment Patient Details Name: Larry [name] MRN: [id number] DOB: [birth year] Today's Date: 02/18/2022   History of present illness Pt is a 71 y.o. M who presents 02/15/2022 with progressively worsening weakness over last 2 weeks. Admitted with symptomatic anemia secondary to GIB/nonbleeding gastric and duodenal ulcer. Significant PMH: HTN, HLD, DM2, MS, SCI.   OT comments  Pt with bed at [facility] for rehab per daughter, to discharge today. Pt educated in [place]. Pt self feeding with built up utensils from home. Grooming with red foam build up on toothbrush. Pt sitting unsupported in long sit for activities without LOB or UE support. Declined OOB to chair. Educated pt in pressure relief in bed and chair.   Recommendations for follow up therapy are one component of a multi-disciplinary discharge planning process, led by the attending physician.  Recommendations may be updated based on patient status, additional functional criteria and insurance authorization.    Follow Up Recommendations  Skilled nursing-short term rehab (<3 hours/day)    Assistance Recommended at Discharge Frequent or constant Supervision/Assistance  Patient can return home with the following  A lot of help with walking and/or transfers;A lot of help with bathing/dressing/bathroom;Assistance with cooking/housework;Assistance with feeding;Assist for transportation;Help with stairs or ramp for entrance   Equipment Recommendations  None recommended by OT    Recommendations for Other Services      Precautions / Restrictions Precautions Precautions: Fall       Mobility Bed Mobility Overal bed mobility: Modified Independent Bed Mobility: Supine to Sit     Supine to sit: Modified independent (Device/Increase time)     General bed mobility comments: supine to long sitting in bed using B bed rails    Transfers                   General transfer comment: pt declined OOB      Balance Overall balance assessment: Needs assistance   Sitting balance-Leahy Scale: Fair Sitting balance - Comments: long sitting                                   ADL either performed or assessed with clinical judgement   ADL Overall ADL's : Needs assistance/impaired Eating/Feeding: Minimal assistance;Sitting Eating/Feeding Details (indicate cue type and reason): daughter brought in pt's build up utensils from home Grooming: Minimal assistance;Sitting;Oral care Grooming Details (indicate cue type and reason): educated in use of foam built up on toothbrush vs pt purchasing battery operated toothbrush                                    Extremity/Trunk Assessment              Vision       Perception     Praxis      Cognition Arousal/Alertness: Awake/alert Behavior During Therapy: WFL for tasks assessed/performed Overall Cognitive Status: Within Functional Limits for tasks assessed                                          Exercises Exercises: Other exercises Other Exercises Other Exercises: yellow theraputty B hands Other Exercises: red theraband x 10 horizontal abduction, FF B    Shoulder Instructions       General Comments  Pertinent Vitals/ Pain       Pain Assessment Pain Assessment: No/denies pain  Home Living                                          Prior Functioning/Environment              Frequency  Min 2X/week        Progress Toward Goals  OT Goals(current goals can now be found in the care plan section)  Progress towards OT goals: Progressing toward goals  Acute Rehab OT Goals OT Goal Formulation: With patient Time For Goal Achievement: 03/02/22 Potential to Achieve Goals: Good  Plan Discharge plan needs to be updated    Co-evaluation                 AM-PAC OT "6 Clicks" Daily Activity     Outcome Measure   Help from another person eating meals?: A  Little Help from another person taking care of personal grooming?: A Little Help from another person toileting, which includes using toliet, bedpan, or urinal?: A Lot Help from another person bathing (including washing, rinsing, drying)?: A Lot Help from another person to put on and taking off regular upper body clothing?: A Little Help from another person to put on and taking off regular lower body clothing?: A Lot 6 Click Score: 15    End of Session    OT Visit Diagnosis: Muscle weakness (generalized) (M62.81)   Activity Tolerance Patient tolerated treatment well   Patient Left in bed;with call bell/phone within reach;with family/visitor present   Nurse Communication          Time: 2751-7001 OT Time Calculation (min): 18 min  Charges: OT General Charges $OT Visit: 1 Visit OT Treatments $Therapeutic Exercise: 8-22 mins  Cleta Alberts, OTR/L Acute Rehabilitation Services Office: [phone]   Malka So 02/18/2022, 11:41 AM

## 2022-02-18 NOTE — Addendum Note (Signed)
Addendum  created 02/18/22 1420 by Parrish Bonn, March Rummage, DO   Attestation recorded in [place], [employer] filed

## 2022-02-19 DIAGNOSIS — M6281 Muscle weakness (generalized): Secondary | ICD-10-CM | POA: Diagnosis not present

## 2022-02-19 DIAGNOSIS — G35 Multiple sclerosis: Secondary | ICD-10-CM | POA: Diagnosis not present

## 2022-02-19 DIAGNOSIS — Z981 Arthrodesis status: Secondary | ICD-10-CM | POA: Diagnosis not present

## 2022-02-19 DIAGNOSIS — M4726 Other spondylosis with radiculopathy, lumbar region: Secondary | ICD-10-CM | POA: Diagnosis not present

## 2022-02-23 ENCOUNTER — Ambulatory Visit: Payer: Medicare Other | Admitting: Nurse Practitioner

## 2022-02-23 DIAGNOSIS — M4726 Other spondylosis with radiculopathy, lumbar region: Secondary | ICD-10-CM | POA: Diagnosis not present

## 2022-02-23 DIAGNOSIS — G35 Multiple sclerosis: Secondary | ICD-10-CM | POA: Diagnosis not present

## 2022-02-23 DIAGNOSIS — M6281 Muscle weakness (generalized): Secondary | ICD-10-CM | POA: Diagnosis not present

## 2022-02-23 DIAGNOSIS — Z981 Arthrodesis status: Secondary | ICD-10-CM | POA: Diagnosis not present

## 2022-02-25 ENCOUNTER — Other Ambulatory Visit: Payer: Self-pay | Admitting: *Deleted

## 2022-02-25 DIAGNOSIS — K250 Acute gastric ulcer with hemorrhage: Secondary | ICD-10-CM | POA: Diagnosis not present

## 2022-02-25 DIAGNOSIS — G8220 Paraplegia, unspecified: Secondary | ICD-10-CM | POA: Diagnosis not present

## 2022-02-25 DIAGNOSIS — R339 Retention of urine, unspecified: Secondary | ICD-10-CM | POA: Diagnosis not present

## 2022-02-25 DIAGNOSIS — G35 Multiple sclerosis: Secondary | ICD-10-CM | POA: Diagnosis not present

## 2022-02-25 DIAGNOSIS — M4726 Other spondylosis with radiculopathy, lumbar region: Secondary | ICD-10-CM | POA: Diagnosis not present

## 2022-02-25 DIAGNOSIS — E785 Hyperlipidemia, unspecified: Secondary | ICD-10-CM | POA: Diagnosis not present

## 2022-02-25 DIAGNOSIS — E118 Type 2 diabetes mellitus with unspecified complications: Secondary | ICD-10-CM | POA: Diagnosis not present

## 2022-02-25 DIAGNOSIS — D649 Anemia, unspecified: Secondary | ICD-10-CM | POA: Diagnosis not present

## 2022-02-25 DIAGNOSIS — K922 Gastrointestinal hemorrhage, unspecified: Secondary | ICD-10-CM | POA: Diagnosis not present

## 2022-02-25 DIAGNOSIS — I10 Essential (primary) hypertension: Secondary | ICD-10-CM | POA: Diagnosis not present

## 2022-02-25 DIAGNOSIS — M4316 Spondylolisthesis, lumbar region: Secondary | ICD-10-CM | POA: Diagnosis not present

## 2022-02-25 NOTE — Patient Outreach (Signed)
[name] resides in [name]. Screening for potential [facility] care coordination/care management services as benefit of insurance plan and PCP.   Update received from Larry [name] therapy manager indicating Larry [name] is from home alone. Transition plan is to return home.   Will continue to follow and plan outreach to discuss [facility] services.    Larry Rolling, MSN, RN,BSN Larry [name] Acute Care Coordinator [phone] (Direct dial)

## 2022-02-27 ENCOUNTER — Ambulatory Visit: Payer: Medicare Other | Admitting: Nurse Practitioner

## 2022-03-03 DIAGNOSIS — M4726 Other spondylosis with radiculopathy, lumbar region: Secondary | ICD-10-CM | POA: Diagnosis not present

## 2022-03-03 DIAGNOSIS — Z981 Arthrodesis status: Secondary | ICD-10-CM | POA: Diagnosis not present

## 2022-03-03 DIAGNOSIS — G35 Multiple sclerosis: Secondary | ICD-10-CM | POA: Diagnosis not present

## 2022-03-03 DIAGNOSIS — M6281 Muscle weakness (generalized): Secondary | ICD-10-CM | POA: Diagnosis not present

## 2022-03-04 DIAGNOSIS — D649 Anemia, unspecified: Secondary | ICD-10-CM | POA: Diagnosis not present

## 2022-03-04 DIAGNOSIS — M4316 Spondylolisthesis, lumbar region: Secondary | ICD-10-CM | POA: Diagnosis not present

## 2022-03-04 DIAGNOSIS — E118 Type 2 diabetes mellitus with unspecified complications: Secondary | ICD-10-CM | POA: Diagnosis not present

## 2022-03-04 DIAGNOSIS — E785 Hyperlipidemia, unspecified: Secondary | ICD-10-CM | POA: Diagnosis not present

## 2022-03-04 DIAGNOSIS — M4726 Other spondylosis with radiculopathy, lumbar region: Secondary | ICD-10-CM | POA: Diagnosis not present

## 2022-03-04 DIAGNOSIS — R339 Retention of urine, unspecified: Secondary | ICD-10-CM | POA: Diagnosis not present

## 2022-03-04 DIAGNOSIS — G35 Multiple sclerosis: Secondary | ICD-10-CM | POA: Diagnosis not present

## 2022-03-04 DIAGNOSIS — I10 Essential (primary) hypertension: Secondary | ICD-10-CM | POA: Diagnosis not present

## 2022-03-04 DIAGNOSIS — G8220 Paraplegia, unspecified: Secondary | ICD-10-CM | POA: Diagnosis not present

## 2022-03-04 DIAGNOSIS — K922 Gastrointestinal hemorrhage, unspecified: Secondary | ICD-10-CM | POA: Diagnosis not present

## 2022-03-04 DIAGNOSIS — K250 Acute gastric ulcer with hemorrhage: Secondary | ICD-10-CM | POA: Diagnosis not present

## 2022-03-05 ENCOUNTER — Telehealth: Payer: Self-pay

## 2022-03-05 NOTE — Progress Notes (Signed)
    Chronic Care Management Pharmacy Assistant   Name: Larry [name]  MRN: [id number] DOB: 03-20-1951  Reason for Encounter: Medication Review/General Adherence Call.   Recent office visits:  01/12/2022 Wilfred Lacy NP (PCP) No Medication Changes noted,Ambulatory referral to Home Health  Recent consult visits:  None ID  Hospital visits:  Medication Reconciliation was completed by comparing discharge summary, patient's EMR and Pharmacy list, and upon discussion with patient.  Admitted to the hospital on 02/15/2022 due to Gastrointestinal hemorrhage. Discharge date was 02/18/2022. Discharged from [place]?Medications Started at [facility] Discharge:?? -started None ID  Medication Changes at Hospital Discharge: -Changed None ID  Medications Discontinued at Hospital Discharge: -Stopped aspirin - Stopped Meloxicam -Stopped Olmesartan-amlodipine Stop Omeprazole    Medications that remain the same after Hospital Discharge:??  -All other medications will remain the same.    Medications: Outpatient Encounter Medications as of 03/05/2022  Medication Sig   ACCU-CHEK AVIVA PLUS test strip TEST FASTING BLOOD SUGAR  ONCE DAILY IN THE MORNING   Accu-Chek FastClix Lancets MISC    atorvastatin (LIPITOR) 20 MG tablet TAKE 1 TABLET BY MOUTH DAILY (Patient taking differently: Take 20 mg by mouth daily.)   baclofen (LIORESAL) 20 MG tablet Take 1 tablet (20 mg total) by mouth 3 (three) times daily.   Blood Glucose Calibration (ACCU-CHEK AVIVA) SOLN    ferrous sulfate 325 (65 FE) MG tablet Take 1 tablet (325 mg total) by mouth daily with breakfast. (Patient not taking: Reported on 01/12/2022)   finasteride (PROSCAR) 5 MG tablet Take 1 tablet (5 mg total) by mouth daily.   Incontinence Supply Disposable (DEPEND ADJUSTABLE UNDERWEAR LG) MISC Use as needed.   Lancets Misc. (ACCU-CHEK FASTCLIX LANCET) KIT 1 Units by Does not apply route daily before breakfast.    metFORMIN (GLUCOPHAGE-XR) 750 MG 24 hr tablet Take 1 tablet (750 mg total) by mouth daily with breakfast.   mirabegron ER (MYRBETRIQ) 25 MG TB24 tablet Take 1 tablet (25 mg total) by mouth daily.   Multiple Vitamin (MULTIVITAMIN) tablet Take 1 tablet by mouth daily.   pantoprazole (PROTONIX) 40 MG tablet Take 1 tablet (40 mg total) by mouth 2 (two) times daily before a meal.   pregabalin (LYRICA) 100 MG capsule Take 1 capsule (100 mg total) by mouth 2 (two) times daily.   traZODone (DESYREL) 50 MG tablet Take 1 tablet (50 mg total) by mouth at bedtime.   No facility-administered encounter medications on file as of 03/05/2022.    Care Gaps: FVCBS-49 Vaccine Ophthalmology Exam Influenza Vaccine  Star Rating Drugs: Atorvastatin 20 mg last filled 12/27/2021 90 day supply at [facility].  Metformin 750 mg last filled 02/28/2022 90 day supply at [facility].   I have attempted without success to contact this patient by phone three times to do his General adherence call. I left a Voice message for patient to return my call.  [place] Pharmacist Assistant [phone]

## 2022-03-09 DIAGNOSIS — L89152 Pressure ulcer of sacral region, stage 2: Secondary | ICD-10-CM | POA: Diagnosis not present

## 2022-03-10 ENCOUNTER — Other Ambulatory Visit: Payer: Self-pay | Admitting: *Deleted

## 2022-03-10 NOTE — Patient Outreach (Signed)
[facility]N Post- Acute Care Coordinator follow up. Mr. Cammack resides in [place] [facility] per [facility]. Screening for potential [facility] care coordination services as benefit of insurance plan and PCP.  Will request update from [facility] social worker and therapy regarding transition plans.  Marthenia Rolling, MSN, RN,BSN [place] Acute Care Coordinator [phone] (Direct dial)

## 2022-03-11 ENCOUNTER — Encounter: Payer: Self-pay | Admitting: Gastroenterology

## 2022-03-11 ENCOUNTER — Ambulatory Visit (INDEPENDENT_AMBULATORY_CARE_PROVIDER_SITE_OTHER): Payer: Medicare Other | Admitting: Gastroenterology

## 2022-03-11 VITALS — BP 126/76 | HR 64 | Ht 67.0 in | Wt 155.2 lb

## 2022-03-11 DIAGNOSIS — K254 Chronic or unspecified gastric ulcer with hemorrhage: Secondary | ICD-10-CM | POA: Diagnosis not present

## 2022-03-11 DIAGNOSIS — K269 Duodenal ulcer, unspecified as acute or chronic, without hemorrhage or perforation: Secondary | ICD-10-CM

## 2022-03-11 DIAGNOSIS — K274 Chronic or unspecified peptic ulcer, site unspecified, with hemorrhage: Secondary | ICD-10-CM

## 2022-03-11 DIAGNOSIS — D62 Acute posthemorrhagic anemia: Secondary | ICD-10-CM

## 2022-03-11 NOTE — Progress Notes (Signed)
03/11/2022 Larry [name] [id number] 03-Jul-1950   HISTORY OF PRESENT ILLNESS: This is a 71 year old male who is a patient of Dr. Blanch Media.  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  He was seen in consult by our service in the hospital last month for symptomatic anemia and GI bleed in the setting of meloxicam use.  Hemoglobin dropped from 13.1 g to 4.5 g upon arrival.  EGD was performed and showed the following:  EGD 01/2022:  - Normal esophagus. - Non-bleeding gastric ulcers with no stigmata of bleeding. Biopsied. - Non-bleeding duodenal ulcer with no stigmata of bleeding.  A. STOMACH, BIOPSY:  Reactive gastropathy with mild chronic gastritis  Negative for H. pylori, intestinal metaplasia, dysplasia and carcinoma   He is no longer on any type of NSAIDs.  He is on pantoprazole 40 mg twice daily.  Plan is for repeat EGD in about 3 months to confirm healing.  He feels well.  Has had no further sign of GI bleeding.  His hemoglobin a couple of weeks ago was 9.9 g at the time of his hospital discharge.  He says that he feels good.  No abdominal pain.  He is eating well.  He is currently still at a rehab facility, but likely will be discharged after this week.  Past Medical History:  Diagnosis Date   Abnormality of gait    Acute delirium 12/11/2011   Acute respiratory failure due to COVID-19 [facility]) 09/23/2019   Cognitive communication deficit    CTS (carpal tunnel syndrome) 09/26/2015   Diabetes mellitus    Hypercholesterolemia    Hypertension    Kidney stones    Leukocytopenia 05/22/2013   MS (multiple sclerosis) ([place])    sees [facility]   T10 spinal cord injury ([place]) 12/12/2011   hx surgery 1996 with bilat gait abnormality since   Past Surgical History:  Procedure Laterality Date   BACK SURGERY     BIOPSY  02/16/2022   Procedure: BIOPSY;  Surgeon: Thornton Park, MD;   Location: [facility];  Service: Gastroenterology;;   COLONOSCOPY     ESOPHAGOGASTRODUODENOSCOPY (EGD) WITH PROPOFOL N/A 02/16/2022   Procedure: ESOPHAGOGASTRODUODENOSCOPY (EGD) WITH PROPOFOL;  Surgeon: Thornton Park, MD;  Location: [place];  Service: Gastroenterology;  Laterality: N/A;   LUMBAR LAMINECTOMY      reports that he has never smoked. He has never used smokeless tobacco. He reports that he does not drink alcohol and does not use drugs. family history includes Hypertension in his mother and sister; Liver cancer in his father. Allergies  Allergen Reactions   Rosuvastatin Other (See Comments)    Leg swelling      Outpatient Encounter Medications as of 03/11/2022  Medication Sig   ACCU-CHEK AVIVA PLUS test strip TEST FASTING BLOOD SUGAR  ONCE DAILY IN THE MORNING   Accu-Chek FastClix Lancets MISC    atorvastatin (LIPITOR) 20 MG tablet TAKE 1 TABLET BY MOUTH DAILY (Patient taking differently: Take 20 mg by mouth daily.)   baclofen (LIORESAL) 20 MG tablet Take 1 tablet (20 mg total) by mouth 3 (three) times daily.   Blood Glucose Calibration (ACCU-CHEK AVIVA) SOLN    finasteride (PROSCAR) 5 MG tablet Take 1 tablet (5 mg total) by mouth daily.   Incontinence Supply Disposable (DEPEND ADJUSTABLE UNDERWEAR LG) MISC Use as needed.   Lancets Misc. (ACCU-CHEK FASTCLIX LANCET) KIT 1 Units by Does not apply route daily  before breakfast.   metFORMIN (GLUCOPHAGE-XR) 750 MG 24 hr tablet Take 1 tablet (750 mg total) by mouth daily with breakfast.   mirabegron ER (MYRBETRIQ) 25 MG TB24 tablet Take 1 tablet (25 mg total) by mouth daily.   Multiple Vitamin (MULTIVITAMIN) tablet Take 1 tablet by mouth daily.   pantoprazole (PROTONIX) 40 MG tablet Take 1 tablet (40 mg total) by mouth 2 (two) times daily before a meal.   pregabalin (LYRICA) 100 MG capsule Take 1 capsule (100 mg total) by mouth 2 (two) times daily.   traZODone (DESYREL) 50 MG tablet Take 1 tablet (50 mg total) by mouth at  bedtime.   ferrous sulfate 325 (65 FE) MG tablet Take 1 tablet (325 mg total) by mouth daily with breakfast. (Patient not taking: Reported on 01/12/2022)   No facility-administered encounter medications on file as of 03/11/2022.    REVIEW OF SYSTEMS  : All other systems reviewed and negative except where noted in the History of Present Illness.   PHYSICAL EXAM: BP 126/76 (BP Location: Left Arm, Patient Position: Sitting, Cuff Size: Normal)   Pulse 64   Ht 5' 7" (1.702 m)   Wt 155 lb 4 oz (70.4 kg) Comment: unable to obtain/ pt in WC  SpO2 98%   BMI 24.32 kg/m  General: Well developed male in no acute distress; in wheelchair Head: Normocephalic and atraumatic Eyes:  Sclerae anicteric, conjunctiva pink. Ears: Normal auditory acuity Lungs: Clear throughout to auscultation; no W/R/R Heart: Regular rate and rhythm; no M/R/G. Abdomen: Soft, non-distended.  BS present.  Non-tender. Musculoskeletal: Symmetrical with no gross deformities  Skin: No lesions on visible extremities Extremities: No edema  Neurological: Alert oriented x 4. Psychological:  Alert and cooperative. Normal mood and affect  ASSESSMENT AND PLAN: *Gastric and duodenal ulcers:  Likely due to meloxicam use.  No longer on NSAIDs.  Is on pantoprazole 40 mg BID.  Will continue that.  We will plan to repeat EGD in 3 months.  This can be performed in the [place].  Patient is in a wheelchair and cannot stand, but he self transfers.  We will schedule with Dr. Henrene Pastor.  The risks, benefits, and alternatives to EGD were discussed with the patient and he consents to proceed.  We will plan for STAT CBC the day of his procedure.  Without any sign of ongoing bleeding I suspect that this should only be increasing.  He has an appointment with his PCP later this month and she may recheck it again at that time as well, but I think at this point we can allow it more time to improve some not going to check it today.  CC:  Nche, Charlene Brooke, NP

## 2022-03-11 NOTE — Progress Notes (Signed)
Noted  

## 2022-03-11 NOTE — Patient Instructions (Signed)
_______________________________________________________  If you are age 73 or older, your body mass index should be between 23-30. Your Body mass index is 24.32 kg/m. If this is out of the aforementioned range listed, please consider follow up with your Primary Care Provider. ________________________________________________________  The [facility] providers would like to encourage you to use [facility] to communicate with providers for non-urgent requests or questions.  Due to long hold times on the telephone, sending your provider a message by [facility] may be a faster and more efficient way to get a response.  Please allow 48 business hours for a response.  Please remember that this is for non-urgent requests.  _______________________________________________________  CONTINUE: pantoprazole '40mg'$  one tablet twice daily.  You have been scheduled for an endoscopy. Please follow written instructions given to you at your visit today. If you use inhalers (even only as needed), please bring them with you on the day of your procedure.  Due to recent changes in healthcare laws, you may see the results of your imaging and laboratory studies on MyChart before your provider has had a chance to review them.  We understand that in some cases there may be results that are confusing or concerning to you. Not all laboratory results come back in the same time frame and the provider may be waiting for multiple results in order to interpret others.  Please give [place] 48 hours in order for your provider to thoroughly review all the results before contacting the office for clarification of your results.   Thank you for entrusting me with your care and choosing [facility].  Alonza Bogus, PA-C

## 2022-03-12 DIAGNOSIS — K922 Gastrointestinal hemorrhage, unspecified: Secondary | ICD-10-CM | POA: Diagnosis not present

## 2022-03-12 DIAGNOSIS — I10 Essential (primary) hypertension: Secondary | ICD-10-CM | POA: Diagnosis not present

## 2022-03-12 DIAGNOSIS — R339 Retention of urine, unspecified: Secondary | ICD-10-CM | POA: Diagnosis not present

## 2022-03-12 DIAGNOSIS — K250 Acute gastric ulcer with hemorrhage: Secondary | ICD-10-CM | POA: Diagnosis not present

## 2022-03-12 DIAGNOSIS — E785 Hyperlipidemia, unspecified: Secondary | ICD-10-CM | POA: Diagnosis not present

## 2022-03-12 DIAGNOSIS — M4316 Spondylolisthesis, lumbar region: Secondary | ICD-10-CM | POA: Diagnosis not present

## 2022-03-12 DIAGNOSIS — D649 Anemia, unspecified: Secondary | ICD-10-CM | POA: Diagnosis not present

## 2022-03-12 DIAGNOSIS — M4726 Other spondylosis with radiculopathy, lumbar region: Secondary | ICD-10-CM | POA: Diagnosis not present

## 2022-03-12 DIAGNOSIS — E118 Type 2 diabetes mellitus with unspecified complications: Secondary | ICD-10-CM | POA: Diagnosis not present

## 2022-03-12 DIAGNOSIS — G35 Multiple sclerosis: Secondary | ICD-10-CM | POA: Diagnosis not present

## 2022-03-12 DIAGNOSIS — M6281 Muscle weakness (generalized): Secondary | ICD-10-CM | POA: Diagnosis not present

## 2022-03-12 DIAGNOSIS — R52 Pain, unspecified: Secondary | ICD-10-CM | POA: Diagnosis not present

## 2022-03-12 DIAGNOSIS — G8220 Paraplegia, unspecified: Secondary | ICD-10-CM | POA: Diagnosis not present

## 2022-03-13 ENCOUNTER — Other Ambulatory Visit: Payer: Self-pay | Admitting: *Deleted

## 2022-03-13 DIAGNOSIS — I10 Essential (primary) hypertension: Secondary | ICD-10-CM

## 2022-03-13 NOTE — Patient Outreach (Signed)
[place] Coordinator follow up. Larry [name] resides in [facility]. Screening for potential [facility] care coordination services as benefit of insurance plan and PCP.  Met with [name] and [name] social workers at [facility] on 03/13/22. Larry [name] lives alone. Slated to transition to home on Saturday. Will have [facility] for PT/OT/RN.   Met with Larry [name] at bedside. Explained [facility] services. Larry [name] agreeable. Provided [facility] literature and writer's contact information.   Larry [name] reports he does live alone. However, he has friends that assist him. States his daughter from [place] will stay with him for a few days when he gets home from SNF. Larry [name] stated he receives Meals on Wheels and utilizes [employer] transportation benefit. He is familiar how to arrange transportation. Larry [name] states he wants to be sure he eats more when he gets home. States he does not think he was eating enough prior. Also states he is feeling more comfortable with his transfers. Larry [name] is wheel chair bound.   Confirmed best contact number for Larry [name] is [phone]. Reminded Larry [name] to make follow up with PCP post SNF as well. He was agreeable.   Will make referral to [facility] care coordination team.   Larry [name] will discharge home from [place] on Saturday 03/14/22.   Marthenia Rolling, MSN, RN,BSN [place] Acute Care Coordinator [phone] (Direct dial)

## 2022-03-16 ENCOUNTER — Telehealth: Payer: Self-pay | Admitting: *Deleted

## 2022-03-16 DIAGNOSIS — K269 Duodenal ulcer, unspecified as acute or chronic, without hemorrhage or perforation: Secondary | ICD-10-CM | POA: Diagnosis not present

## 2022-03-16 DIAGNOSIS — D62 Acute posthemorrhagic anemia: Secondary | ICD-10-CM | POA: Diagnosis not present

## 2022-03-16 DIAGNOSIS — M48062 Spinal stenosis, lumbar region with neurogenic claudication: Secondary | ICD-10-CM | POA: Diagnosis not present

## 2022-03-16 DIAGNOSIS — L89152 Pressure ulcer of sacral region, stage 2: Secondary | ICD-10-CM | POA: Diagnosis not present

## 2022-03-16 DIAGNOSIS — K259 Gastric ulcer, unspecified as acute or chronic, without hemorrhage or perforation: Secondary | ICD-10-CM | POA: Diagnosis not present

## 2022-03-16 NOTE — Chronic Care Management (AMB) (Signed)
  Care Coordination   Note   03/16/2022 Name: Larry [name] MRN: [id number] DOB: 1951-05-01  Larry [name] is a 71 y.o. year old male who sees Nche, Charlene Brooke, NP for primary care. I reached out to Julieanne Manson by phone today to offer care coordination services.  [name] was given information about Care Coordination services today including:   The Care Coordination services include support from the care team which includes your Nurse Coordinator, Clinical Social Worker, or Pharmacist.  The Care Coordination team is here to help remove barriers to the health concerns and goals most important to you. Care Coordination services are voluntary, and the patient may decline or stop services at any time by request to their care team member.   Care Coordination Consent Status: Patient agreed to services and verbal consent obtained.   Follow up plan:  Telephone appointment with care coordination team member scheduled for:  03/18/22  Encounter Outcome:  Pt. Scheduled  [place]  Direct Dial: [phone]

## 2022-03-18 ENCOUNTER — Ambulatory Visit: Payer: Self-pay

## 2022-03-18 NOTE — Patient Instructions (Signed)
Visit Information  Thank you for taking time to visit with me today. Please don't hesitate to contact me if I can be of assistance to you.   Following are the goals we discussed today:   Goals Addressed             This Visit's Progress    COMPLETED: Care Coordination Activites - no follopw up required        Care Coordination Interventions: Active listening / Reflection utilized  Emotional Support Provided Problem [place] strategies reviewed Discussed/.Educated Care Coordination Program 2.   Discussed/.Educated Annual Wellness Visit 3.   Discussed/.Educated Social Determinates of Health 4.   Please inform PCP if services needed in the future I spoke with Mr. [place], who lives alone and is taking his medication as prescribed. He has transportation to appointments with [facility] and visited the GI clinic on 03/11/22 for ulcers. Mr. Busby' main concern is his leg weakness, for which Alvis Lemmings is sending physical therapy, and and I urged him to take full advantage of the visits.        If you are experiencing a Mental Health or [place] or need someone to talk to, please call 1-800-273-TALK (toll free, 24 hour hotline)  Patient verbalizes understanding of instructions and care plan provided today and agrees to view in [place]. Active MyChart status and patient understanding of how to access instructions and care plan via MyChart confirmed with patient.     Lazaro Arms RN, BSN, [facility] Care Coordinator [facility]   Phone: [phone]    .

## 2022-03-18 NOTE — Patient Outreach (Signed)
  Care Coordination   Initial Visit Note   03/18/2022 Name: Larry [name] MRN: [id number] DOB: July 08, 1950  Larry [name] is a 71 y.o. year old male who sees Nche, Charlene Brooke, NP for primary care. I spoke with  Larry [name] by phone today.  What matters to the patients health and wellness today?  Leg weakness.    Goals Addressed             This Visit's Progress    COMPLETED: Care Coordination Activites - no follopw up required        Care Coordination Interventions: Active listening / Reflection utilized  Emotional Support Provided Problem [place] strategies reviewed Discussed/.Educated Care Coordination Program 2.   Discussed/.Educated Annual Wellness Visit 3.   Discussed/.Educated Social Determinates of Health 4.   Please inform PCP if services needed in the future I spoke with Larry [name], who lives alone and is taking his medication as prescribed. He has transportation to appointments with [facility] and visited the GI clinic on 03/11/22 for ulcers. Larry [name]' main concern is his leg weakness, for which Larry [name] is sending physical therapy, and and I urged him to take full advantage of the visits.         SDOH assessments and interventions completed:  Yes  SDOH Interventions Today    Flowsheet Row Most Recent Value  SDOH Interventions   Food Insecurity Interventions Intervention Not Indicated  Transportation Interventions Intervention Not Indicated        Care Coordination Interventions Activated:  Yes  Care Coordination Interventions:  Yes, provided   Follow up plan: No further intervention required.   Encounter Outcome:  Pt. Visit Completed   Larry Arms RN, BSN, [facility]   Phone: [phone]   .

## 2022-03-19 ENCOUNTER — Telehealth: Payer: Self-pay | Admitting: Nurse Practitioner

## 2022-03-19 NOTE — Telephone Encounter (Signed)
Verbal order provided.

## 2022-03-19 NOTE — Telephone Encounter (Signed)
Larry [name] from [facility] is needing verbal orders for pt- He has a pressure sore, grade 2 needs a nurse eval. OT self care eval.  Home Health Aid for a shower.  Please advise Larry [name] @ [phone]

## 2022-03-20 ENCOUNTER — Ambulatory Visit (INDEPENDENT_AMBULATORY_CARE_PROVIDER_SITE_OTHER): Payer: Medicare Other | Admitting: Nurse Practitioner

## 2022-03-20 ENCOUNTER — Encounter: Payer: Self-pay | Admitting: Nurse Practitioner

## 2022-03-20 VITALS — BP 120/60 | HR 84 | Temp 97.6°F | Ht 67.0 in | Wt 155.0 lb

## 2022-03-20 DIAGNOSIS — M5416 Radiculopathy, lumbar region: Secondary | ICD-10-CM

## 2022-03-20 DIAGNOSIS — I10 Essential (primary) hypertension: Secondary | ICD-10-CM | POA: Diagnosis not present

## 2022-03-20 DIAGNOSIS — E1142 Type 2 diabetes mellitus with diabetic polyneuropathy: Secondary | ICD-10-CM

## 2022-03-20 DIAGNOSIS — D62 Acute posthemorrhagic anemia: Secondary | ICD-10-CM | POA: Diagnosis not present

## 2022-03-20 DIAGNOSIS — M48062 Spinal stenosis, lumbar region with neurogenic claudication: Secondary | ICD-10-CM | POA: Diagnosis not present

## 2022-03-20 DIAGNOSIS — M48061 Spinal stenosis, lumbar region without neurogenic claudication: Secondary | ICD-10-CM | POA: Diagnosis not present

## 2022-03-20 DIAGNOSIS — K259 Gastric ulcer, unspecified as acute or chronic, without hemorrhage or perforation: Secondary | ICD-10-CM | POA: Diagnosis not present

## 2022-03-20 DIAGNOSIS — Z23 Encounter for immunization: Secondary | ICD-10-CM | POA: Diagnosis not present

## 2022-03-20 DIAGNOSIS — G8220 Paraplegia, unspecified: Secondary | ICD-10-CM

## 2022-03-20 DIAGNOSIS — L89152 Pressure ulcer of sacral region, stage 2: Secondary | ICD-10-CM | POA: Diagnosis not present

## 2022-03-20 DIAGNOSIS — K269 Duodenal ulcer, unspecified as acute or chronic, without hemorrhage or perforation: Secondary | ICD-10-CM | POA: Diagnosis not present

## 2022-03-20 DIAGNOSIS — D500 Iron deficiency anemia secondary to blood loss (chronic): Secondary | ICD-10-CM

## 2022-03-20 DIAGNOSIS — K250 Acute gastric ulcer with hemorrhage: Secondary | ICD-10-CM

## 2022-03-20 LAB — CBC WITH DIFFERENTIAL/PLATELET
Basophils Absolute: 0 10*3/uL (ref 0.0–0.1)
Basophils Relative: 0.6 % (ref 0.0–3.0)
Eosinophils Absolute: 0.2 10*3/uL (ref 0.0–0.7)
Eosinophils Relative: 3.6 % (ref 0.0–5.0)
HCT: 32.1 % — ABNORMAL LOW (ref 39.0–52.0)
Hemoglobin: 10.4 g/dL — ABNORMAL LOW (ref 13.0–17.0)
Lymphocytes Relative: 12 % (ref 12.0–46.0)
Lymphs Abs: 0.8 10*3/uL (ref 0.7–4.0)
MCHC: 32.3 g/dL (ref 30.0–36.0)
MCV: 87.7 fl (ref 78.0–100.0)
Monocytes Absolute: 0.3 10*3/uL (ref 0.1–1.0)
Monocytes Relative: 4.4 % (ref 3.0–12.0)
Neutro Abs: 5 10*3/uL (ref 1.4–7.7)
Neutrophils Relative %: 79.4 % — ABNORMAL HIGH (ref 43.0–77.0)
Platelets: 489 10*3/uL — ABNORMAL HIGH (ref 150.0–400.0)
RBC: 3.66 Mil/uL — ABNORMAL LOW (ref 4.22–5.81)
RDW: 15.2 % (ref 11.5–15.5)
WBC: 6.3 10*3/uL (ref 4.0–10.5)

## 2022-03-20 LAB — RENAL FUNCTION PANEL
Albumin: 4.1 g/dL (ref 3.5–5.2)
BUN: 40 mg/dL — ABNORMAL HIGH (ref 6–23)
CO2: 25 mEq/L (ref 19–32)
Calcium: 9.5 mg/dL (ref 8.4–10.5)
Chloride: 104 mEq/L (ref 96–112)
Creatinine, Ser: 1.38 mg/dL (ref 0.40–1.50)
GFR: 50.69 mL/min — ABNORMAL LOW (ref >60.00)
Glucose, Bld: 185 mg/dL — ABNORMAL HIGH (ref 70–99)
Phosphorus: 3.2 mg/dL (ref 2.3–4.6)
Potassium: 3.9 mEq/L (ref 3.5–5.1)
Sodium: 140 mEq/L (ref 135–145)

## 2022-03-20 NOTE — Progress Notes (Unsigned)
Established Patient Visit  Patient: Larry [name]   DOB: 01-23-1951   71 y.o. Male  MRN: [id number] Visit Date: 03/24/2022  Subjective:    Chief Complaint  Patient presents with   Hospitalization [facility] F/u 03/17/22 Says things have been better, but no concerns  Flu shot given today  Unable to get weight due to pt Would like an adjustable bed for home    HPI Companied by daughter  Hospitalization 09/17-09/20/23 due to acute GI bleed of gastric ulcer which led to acute encephalopathy and acute kidney injury. He was discharged from [facility] on 03/14/2022. He has ongoing home PT and OT He is requesting for a hospital bed to facility getting in and out of bed. He has difficulty getting out of bed from supine position. He lives alone in a range style home. Denies any fall since discharge from hospital and rehab.  DM (diabetes mellitus) ([place]) Repeat renal function and urine microalbumin Last hgbA1c at 6.6% (12/2021) Controlled with metformin  Gastric ulcer with hemorrhage Hospitalization 01/2022 x 3days.He received 3units of PRBCs during hospitalization. EGD completed: non bleeding duodenal ulcer. Gastric biopsy indicated chronic gastritis and reactive gastropathy, negative H.pylori and malignancy. He is to take PPI x 10weeks Today he denies any No melena or hematochezia or nausea or ABD pain or constipation or diarrhea. He has upcomign appt with GI for repeat upper endoscopy 05/13/22. He is aware to maintain pantoprazole 62m BID x 10weeks and avoid all ASA and NSAIDs.  Repeat cbc and iron panel today  BP Readings from Last 3 Encounters:  03/20/22 120/60  03/11/22 126/76  02/17/22 135/87    Reviewed medical, surgical, and social history today  Medications: Outpatient Medications Prior to Visit  Medication Sig   ACCU-CHEK AVIVA PLUS test strip TEST FASTING BLOOD SUGAR  ONCE DAILY IN THE MORNING   Accu-Chek FastClix Lancets MISC     atorvastatin (LIPITOR) 20 MG tablet TAKE 1 TABLET BY MOUTH DAILY (Patient taking differently: Take 20 mg by mouth daily.)   Blood Glucose Calibration (ACCU-CHEK AVIVA) SOLN    ferrous sulfate 325 (65 FE) MG tablet Take 1 tablet (325 mg total) by mouth daily with breakfast.   finasteride (PROSCAR) 5 MG tablet Take 1 tablet (5 mg total) by mouth daily.   Incontinence Supply Disposable (DEPEND ADJUSTABLE UNDERWEAR LG) MISC Use as needed.   Lancets Misc. (ACCU-CHEK FASTCLIX LANCET) KIT 1 Units by Does not apply route daily before breakfast.   metFORMIN (GLUCOPHAGE-XR) 750 MG 24 hr tablet Take 1 tablet (750 mg total) by mouth daily with breakfast.   mirabegron ER (MYRBETRIQ) 25 MG TB24 tablet Take 1 tablet (25 mg total) by mouth daily.   Multiple Vitamin (MULTIVITAMIN) tablet Take 1 tablet by mouth daily.   pantoprazole (PROTONIX) 40 MG tablet Take 1 tablet (40 mg total) by mouth 2 (two) times daily before a meal.   pregabalin (LYRICA) 100 MG capsule Take 1 capsule (100 mg total) by mouth 2 (two) times daily.   traZODone (DESYREL) 50 MG tablet Take 1 tablet (50 mg total) by mouth at bedtime.   baclofen (LIORESAL) 20 MG tablet Take 1 tablet (20 mg total) by mouth 3 (three) times daily. (Patient not taking: Reported on 03/20/2022)   No facility-administered medications prior to visit.   Reviewed past medical and social history.   ROS per HPI above      Objective:  BP 120/60 (BP Location: Left Arm, Patient Position: Sitting, Cuff Size: Normal)   Pulse 84   Temp 97.6 F (36.4 C) (Temporal)   Ht $R'5\' 7"'lW$  (1.702 m)   Wt 155 lb (70.3 kg)   SpO2 (!) 87%   BMI 24.28 kg/m      Physical Exam Cardiovascular:     Rate and Rhythm: Normal rate and regular rhythm.     Pulses: Normal pulses.     Heart sounds: Normal heart sounds.  Pulmonary:     Effort: Pulmonary effort is normal.     Breath sounds: Normal breath sounds.  Abdominal:     General: Bowel sounds are normal. There is no distension.      Palpations: Abdomen is soft.     Tenderness: There is no abdominal tenderness. There is no guarding.  Skin:    General: Skin is warm and dry.     Findings: No erythema.  Neurological:     Mental Status: He is alert and oriented to person, place, and time.     Results for orders placed or performed in visit on 03/20/22  CBC with Differential/Platelet  Result Value Ref [place]   WBC 6.3 4.0 - 10.5 K/uL   RBC 3.66 (L) 4.22 - 5.81 Mil/uL   Hemoglobin 10.4 (L) 13.0 - 17.0 g/dL   HCT 32.1 (L) 39.0 - 52.0 %   MCV 87.7 78.0 - 100.0 fl   MCHC 32.3 30.0 - 36.0 g/dL   RDW 15.2 11.5 - 15.5 %   Platelets 489.0 (H) 150.0 - 400.0 K/uL   Neutrophils Relative % 79.4 (H) 43.0 - 77.0 %   Lymphocytes Relative 12.0 12.0 - 46.0 %   Monocytes Relative 4.4 3.0 - 12.0 %   Eosinophils Relative 3.6 0.0 - 5.0 %   Basophils Relative 0.6 0.0 - 3.0 %   Neutro Abs 5.0 1.4 - 7.7 K/uL   Lymphs Abs 0.8 0.7 - 4.0 K/uL   Monocytes Absolute 0.3 0.1 - 1.0 K/uL   Eosinophils Absolute 0.2 0.0 - 0.7 K/uL   Basophils Absolute 0.0 0.0 - 0.1 K/uL  Iron, TIBC and Ferritin Panel  Result Value Ref Range   Iron 39 (L) 50 - 180 mcg/dL   TIBC 307 250 - 425 mcg/dL (calc)   %SAT 13 (L) 20 - 48 % (calc)   Ferritin 20 (L) 24 - 380 ng/mL  Renal function panel with eGFR  Result Value Ref Range   Sodium 140 135 - 145 mEq/L   Potassium 3.9 3.5 - 5.1 mEq/L   Chloride 104 96 - 112 mEq/L   CO2 25 19 - 32 mEq/L   Albumin 4.1 3.5 - 5.2 g/dL   BUN 40 (H) 6 - 23 mg/dL   Creatinine, Ser 1.38 0.40 - 1.50 mg/dL   Glucose, Bld 185 (H) 70 - 99 mg/dL   Phosphorus 3.2 2.3 - 4.6 mg/dL   GFR 50.69 (L) >60.00 mL/min   Calcium 9.5 8.4 - 10.5 mg/dL      Assessment & Plan:    Problem List Items Addressed This Visit       Cardiovascular and Mediastinum   Gastric ulcer with hemorrhage - Primary    Hospitalization 01/2022 x 3days.He received 3units of PRBCs during hospitalization. EGD completed: non bleeding duodenal ulcer. Gastric biopsy  indicated chronic gastritis and reactive gastropathy, negative H.pylori and malignancy. He is to take PPI x 10weeks Today he denies any No melena or hematochezia or nausea or ABD pain or constipation or  diarrhea. He has upcomign appt with GI for repeat upper endoscopy 05/13/22. He is aware to maintain pantoprazole 30m BID x 10weeks and avoid all ASA and NSAIDs.  Repeat cbc and iron panel today      Hypertension     Endocrine   DM (diabetes mellitus) ([place]    Repeat renal function and urine microalbumin Last hgbA1c at 6.6% (12/2021) Controlled with metformin      Relevant Orders   Renal function panel with eGFR (Completed)   Urine microalbumin-creatinine with uACR     Nervous and Auditory   Paraplegia ([place]   Relevant Orders   For home use only DME Hospital bed   Spinal stenosis of lumbar region with radiculopathy   Relevant Orders   For home use only DME Hospital bed     Other   Iron deficiency anemia   Relevant Orders   CBC with Differential/Platelet (Completed)   Iron, TIBC and Ferritin Panel (Completed)   Other Visit Diagnoses     Need for immunization against influenza       Relevant Orders   Flu Vaccine QUAD High Dose(Fluad) (Completed)      Return in about 3 months (around 06/20/2022) for HTN, DM, hyperlipidemia (fasting).     CWilfred Lacy NP

## 2022-03-20 NOTE — Patient Instructions (Signed)
Go to lab

## 2022-03-21 LAB — IRON,TIBC AND FERRITIN PANEL
%SAT: 13 % (calc) — ABNORMAL LOW (ref 20–48)
Ferritin: 20 ng/mL — ABNORMAL LOW (ref 24–380)
Iron: 39 ug/dL — ABNORMAL LOW (ref 50–180)
TIBC: 307 mcg/dL (calc) (ref 250–425)

## 2022-03-23 ENCOUNTER — Telehealth: Payer: Self-pay | Admitting: Nurse Practitioner

## 2022-03-23 DIAGNOSIS — K259 Gastric ulcer, unspecified as acute or chronic, without hemorrhage or perforation: Secondary | ICD-10-CM | POA: Diagnosis not present

## 2022-03-23 DIAGNOSIS — M48062 Spinal stenosis, lumbar region with neurogenic claudication: Secondary | ICD-10-CM | POA: Diagnosis not present

## 2022-03-23 DIAGNOSIS — L89152 Pressure ulcer of sacral region, stage 2: Secondary | ICD-10-CM | POA: Diagnosis not present

## 2022-03-23 DIAGNOSIS — K269 Duodenal ulcer, unspecified as acute or chronic, without hemorrhage or perforation: Secondary | ICD-10-CM | POA: Diagnosis not present

## 2022-03-23 DIAGNOSIS — D62 Acute posthemorrhagic anemia: Secondary | ICD-10-CM | POA: Diagnosis not present

## 2022-03-23 NOTE — Telephone Encounter (Signed)
Called & provided verbal order

## 2022-03-23 NOTE — Telephone Encounter (Signed)
April from [facility] called and want to do verbal orders for pt   1 week three for education progress prevention and April stated that the wounds with sacrum is healed. April can be reached at [phone]

## 2022-03-24 ENCOUNTER — Encounter: Payer: Self-pay | Admitting: Nurse Practitioner

## 2022-03-24 DIAGNOSIS — L89152 Pressure ulcer of sacral region, stage 2: Secondary | ICD-10-CM | POA: Diagnosis not present

## 2022-03-24 DIAGNOSIS — K269 Duodenal ulcer, unspecified as acute or chronic, without hemorrhage or perforation: Secondary | ICD-10-CM | POA: Diagnosis not present

## 2022-03-24 DIAGNOSIS — K259 Gastric ulcer, unspecified as acute or chronic, without hemorrhage or perforation: Secondary | ICD-10-CM | POA: Diagnosis not present

## 2022-03-24 DIAGNOSIS — M48062 Spinal stenosis, lumbar region with neurogenic claudication: Secondary | ICD-10-CM | POA: Diagnosis not present

## 2022-03-24 DIAGNOSIS — D62 Acute posthemorrhagic anemia: Secondary | ICD-10-CM | POA: Diagnosis not present

## 2022-03-24 NOTE — Assessment & Plan Note (Signed)
Repeat renal function and urine microalbumin Last hgbA1c at 6.6% (12/2021) Controlled with metformin

## 2022-03-24 NOTE — Assessment & Plan Note (Addendum)
Hospitalization 01/2022 x 3days.He received 3units of PRBCs during hospitalization. EGD completed: non bleeding duodenal ulcer. Gastric biopsy indicated chronic gastritis and reactive gastropathy, negative H.pylori and malignancy. He is to take PPI x 10weeks Today he denies any No melena or hematochezia or nausea or ABD pain or constipation or diarrhea. He has upcomign appt with GI for repeat upper endoscopy 05/13/22. He is aware to maintain pantoprazole '20mg'$  BID x 10weeks and avoid all ASA and NSAIDs.  Repeat cbc and iron panel today

## 2022-03-25 DIAGNOSIS — L89152 Pressure ulcer of sacral region, stage 2: Secondary | ICD-10-CM | POA: Diagnosis not present

## 2022-03-25 DIAGNOSIS — M48062 Spinal stenosis, lumbar region with neurogenic claudication: Secondary | ICD-10-CM | POA: Diagnosis not present

## 2022-03-25 DIAGNOSIS — D62 Acute posthemorrhagic anemia: Secondary | ICD-10-CM | POA: Diagnosis not present

## 2022-03-25 DIAGNOSIS — K269 Duodenal ulcer, unspecified as acute or chronic, without hemorrhage or perforation: Secondary | ICD-10-CM | POA: Diagnosis not present

## 2022-03-25 DIAGNOSIS — K259 Gastric ulcer, unspecified as acute or chronic, without hemorrhage or perforation: Secondary | ICD-10-CM | POA: Diagnosis not present

## 2022-03-26 DIAGNOSIS — G35 Multiple sclerosis: Secondary | ICD-10-CM | POA: Diagnosis not present

## 2022-03-26 DIAGNOSIS — E119 Type 2 diabetes mellitus without complications: Secondary | ICD-10-CM | POA: Diagnosis not present

## 2022-04-01 DIAGNOSIS — K259 Gastric ulcer, unspecified as acute or chronic, without hemorrhage or perforation: Secondary | ICD-10-CM | POA: Diagnosis not present

## 2022-04-01 DIAGNOSIS — M48062 Spinal stenosis, lumbar region with neurogenic claudication: Secondary | ICD-10-CM | POA: Diagnosis not present

## 2022-04-01 DIAGNOSIS — K269 Duodenal ulcer, unspecified as acute or chronic, without hemorrhage or perforation: Secondary | ICD-10-CM | POA: Diagnosis not present

## 2022-04-01 DIAGNOSIS — D62 Acute posthemorrhagic anemia: Secondary | ICD-10-CM | POA: Diagnosis not present

## 2022-04-01 DIAGNOSIS — L89152 Pressure ulcer of sacral region, stage 2: Secondary | ICD-10-CM | POA: Diagnosis not present

## 2022-04-02 DIAGNOSIS — L89152 Pressure ulcer of sacral region, stage 2: Secondary | ICD-10-CM | POA: Diagnosis not present

## 2022-04-02 DIAGNOSIS — D62 Acute posthemorrhagic anemia: Secondary | ICD-10-CM | POA: Diagnosis not present

## 2022-04-02 DIAGNOSIS — K259 Gastric ulcer, unspecified as acute or chronic, without hemorrhage or perforation: Secondary | ICD-10-CM | POA: Diagnosis not present

## 2022-04-02 DIAGNOSIS — M48062 Spinal stenosis, lumbar region with neurogenic claudication: Secondary | ICD-10-CM | POA: Diagnosis not present

## 2022-04-02 DIAGNOSIS — K269 Duodenal ulcer, unspecified as acute or chronic, without hemorrhage or perforation: Secondary | ICD-10-CM | POA: Diagnosis not present

## 2022-04-06 DIAGNOSIS — K259 Gastric ulcer, unspecified as acute or chronic, without hemorrhage or perforation: Secondary | ICD-10-CM | POA: Diagnosis not present

## 2022-04-06 DIAGNOSIS — D62 Acute posthemorrhagic anemia: Secondary | ICD-10-CM | POA: Diagnosis not present

## 2022-04-06 DIAGNOSIS — K269 Duodenal ulcer, unspecified as acute or chronic, without hemorrhage or perforation: Secondary | ICD-10-CM | POA: Diagnosis not present

## 2022-04-06 DIAGNOSIS — M48062 Spinal stenosis, lumbar region with neurogenic claudication: Secondary | ICD-10-CM | POA: Diagnosis not present

## 2022-04-06 DIAGNOSIS — L89152 Pressure ulcer of sacral region, stage 2: Secondary | ICD-10-CM | POA: Diagnosis not present

## 2022-04-07 DIAGNOSIS — G35 Multiple sclerosis: Secondary | ICD-10-CM | POA: Diagnosis not present

## 2022-04-07 DIAGNOSIS — E119 Type 2 diabetes mellitus without complications: Secondary | ICD-10-CM | POA: Diagnosis not present

## 2022-04-09 DIAGNOSIS — L89152 Pressure ulcer of sacral region, stage 2: Secondary | ICD-10-CM | POA: Diagnosis not present

## 2022-04-09 DIAGNOSIS — K259 Gastric ulcer, unspecified as acute or chronic, without hemorrhage or perforation: Secondary | ICD-10-CM | POA: Diagnosis not present

## 2022-04-09 DIAGNOSIS — M48062 Spinal stenosis, lumbar region with neurogenic claudication: Secondary | ICD-10-CM | POA: Diagnosis not present

## 2022-04-09 DIAGNOSIS — K269 Duodenal ulcer, unspecified as acute or chronic, without hemorrhage or perforation: Secondary | ICD-10-CM | POA: Diagnosis not present

## 2022-04-09 DIAGNOSIS — D62 Acute posthemorrhagic anemia: Secondary | ICD-10-CM | POA: Diagnosis not present

## 2022-04-14 DIAGNOSIS — L89152 Pressure ulcer of sacral region, stage 2: Secondary | ICD-10-CM | POA: Diagnosis not present

## 2022-04-14 DIAGNOSIS — D62 Acute posthemorrhagic anemia: Secondary | ICD-10-CM | POA: Diagnosis not present

## 2022-04-14 DIAGNOSIS — K259 Gastric ulcer, unspecified as acute or chronic, without hemorrhage or perforation: Secondary | ICD-10-CM | POA: Diagnosis not present

## 2022-04-14 DIAGNOSIS — K269 Duodenal ulcer, unspecified as acute or chronic, without hemorrhage or perforation: Secondary | ICD-10-CM | POA: Diagnosis not present

## 2022-04-14 DIAGNOSIS — M48062 Spinal stenosis, lumbar region with neurogenic claudication: Secondary | ICD-10-CM | POA: Diagnosis not present

## 2022-04-16 ENCOUNTER — Telehealth: Payer: Self-pay | Admitting: Nurse Practitioner

## 2022-04-16 NOTE — Telephone Encounter (Signed)
Larry [name] [facility]) called wanting to let PCP know that she is closing wheelchair order due to pt purchasing one out of pocket.

## 2022-04-24 ENCOUNTER — Other Ambulatory Visit: Payer: Self-pay | Admitting: Nurse Practitioner

## 2022-04-24 ENCOUNTER — Other Ambulatory Visit: Payer: Self-pay | Admitting: Family

## 2022-04-24 DIAGNOSIS — E1142 Type 2 diabetes mellitus with diabetic polyneuropathy: Secondary | ICD-10-CM

## 2022-04-24 DIAGNOSIS — N3281 Overactive bladder: Secondary | ICD-10-CM

## 2022-04-27 NOTE — Telephone Encounter (Signed)
Chart supports Rx Last OV: 03/2022 Next OV: 06/2022

## 2022-04-30 ENCOUNTER — Telehealth: Payer: Self-pay | Admitting: Nurse Practitioner

## 2022-04-30 NOTE — Telephone Encounter (Signed)
Left message for patient to call back and schedule Medicare Annual Wellness Visit (AWV) in office.   If not able to come in office, please offer to do virtually or by telephone.  Left office number and my jabber [phone].  Last AWV:04/30/2021   Please schedule at anytime with Nurse Health Advisor.

## 2022-05-06 ENCOUNTER — Other Ambulatory Visit: Payer: Self-pay

## 2022-05-06 ENCOUNTER — Telehealth: Payer: Self-pay | Admitting: *Deleted

## 2022-05-06 DIAGNOSIS — K269 Duodenal ulcer, unspecified as acute or chronic, without hemorrhage or perforation: Secondary | ICD-10-CM

## 2022-05-06 NOTE — Telephone Encounter (Signed)
Larry [name], Unfortunately, as below. Please schedule the patient for EGD at the hospital, during my routinely scheduled hospital block.  Unfortunately, it may be sometime. Please remove from my [place] schedule December 13 at 1:30 PM. Thank you Dr. Henrene Pastor

## 2022-05-06 NOTE — Telephone Encounter (Signed)
Dr. Henrene Pastor,   This pt is a documented difficult intubation and his procedure will need to be done at the hospital.   Thanks,  Osvaldo Angst

## 2022-05-06 NOTE — Telephone Encounter (Signed)
Appt cancelled. Left message for pt to call back to confirm he got the message that appt had to be cancelled and moved to the hospital. Let pt know that we are trying to find a hospital slot and will call and confirm date. Requested pt call back to confirm he got the message.    EGD scheduled at [facility] 07/02/22 at 11:15am. [id number]. Left message for pt to call back.  Spoke with pt and he is aware, updated instructions sent via mychart. Amb ref updated.

## 2022-05-12 ENCOUNTER — Telehealth: Payer: Self-pay

## 2022-05-12 NOTE — Telephone Encounter (Signed)
This nurse attempted to call patient three times for telephonic AWV. Message left that we will call again to reschedule. ?

## 2022-05-13 ENCOUNTER — Encounter: Payer: Medicare Other | Admitting: Internal Medicine

## 2022-05-27 ENCOUNTER — Other Ambulatory Visit: Payer: Self-pay

## 2022-05-27 ENCOUNTER — Telehealth: Payer: Self-pay | Admitting: Nurse Practitioner

## 2022-05-27 ENCOUNTER — Encounter: Payer: Self-pay | Admitting: Nurse Practitioner

## 2022-05-27 MED ORDER — PANTOPRAZOLE SODIUM 40 MG PO TBEC: 40.0000 mg | DELAYED_RELEASE_TABLET | Freq: Two times a day (BID) | ORAL | 0 refills | Status: DC

## 2022-05-27 NOTE — Telephone Encounter (Signed)
Caller Name: pt Call back phone #: [phone]   MEDICATION(S):  pantoprazole (PROTONIX) 40 MG tablet   Days of Med Remaining:   Has the patient contacted their pharmacy (YES/NO)? yes What did pharmacy advise? Order has not been put in  Preferred Pharmacy:  [facility], [place] Phone: [phone]  Fax: [facility] [place] Phone: [phone]      ~~~Please advise patient/caregiver to allow 2-3 business days to process RX refills.

## 2022-05-27 NOTE — Telephone Encounter (Signed)
Rx sent 

## 2022-05-28 ENCOUNTER — Telehealth: Payer: Self-pay | Admitting: Nurse Practitioner

## 2022-05-28 NOTE — Telephone Encounter (Signed)
Left message for patient to call back and schedule Medicare Annual Wellness Visit (AWV) in office.   If not able to come in office, please offer to do virtually or by telephone.  Left office number and my jabber [phone].  Last AWV:04/30/2021   Please schedule at anytime with Nurse Health Advisor.

## 2022-06-12 ENCOUNTER — Ambulatory Visit (INDEPENDENT_AMBULATORY_CARE_PROVIDER_SITE_OTHER): Payer: Medicare Other

## 2022-06-12 VITALS — Ht 67.0 in | Wt 165.0 lb

## 2022-06-12 DIAGNOSIS — Z0000 Encounter for general adult medical examination without abnormal findings: Secondary | ICD-10-CM | POA: Diagnosis not present

## 2022-06-12 NOTE — Patient Instructions (Signed)
Larry [name] , Thank you for taking time to come for your Medicare Wellness Visit. I appreciate your ongoing commitment to your health goals. Please review the following plan we discussed and let me know if I can assist you in the future.   These are the goals we discussed:  Goals      Patient Stated     Ease and safety with showering (including two grab bars in shower (one on long wall partially beside tub bench spanning up towards front wall of shower and one at front wall of shower; as well as widening hallway door to bathroom.  ACTION PLANNING - BATHING Target Problem Area: Safety in shower  Why Problem May Occur: Weakness in lower extremities and lack of grab bars  Target Goal: Independence and safety with showering   STRATEGIES Saving Your Energy: DO: DON'T:  Use a tub bench/seat Stand  too long , it uses more energy  Keep all items you'll need within easy reach [place]  Modifying your home environment and making it safe: DO: DON'T:  Install grab bars in the shower (DONE)   Place a rubber mat where you may be standing--(he will look into a new one)   Make sure the bathroom is well lit    Install a hand held shower head (he decided against this when the were doing the modifications)   Simplifying the way you set up tasks or daily routines: DO: DON'T:  Plan to bathe/shower before you're overly tired [place] through [employer] all items before getting started    Practice It is important to practice the strategies so we can determine if they will be effective in helping to reach your goal. Follow these specific recommendations: 1. Use grab bars as needed. 2. Get new shower mat or non skid decals If a strategy does not work the first time, try it again and again (and maybe again). We may make some changes over the next few sessions, based on how they work.  [place], OTR/L       06/24/2020     Patient Stated     06/12/2022, wants to work with PT again      Track and Manage My Blood Pressure-Hypertension     Timeframe:  Long-Range Goal Priority:  High Start Date:  01/13/2021                            Expected End Date:  01/13/2022                     Follow Up Date 03/30/2021    - check blood pressure weekly    Why is this important?   You won't feel high blood pressure, but it can still hurt your blood vessels.  High blood pressure can cause heart or kidney problems. It can also cause a stroke.  Making lifestyle changes like losing a little weight or eating less salt will help.  Checking your blood pressure at home and at different times of the day can help to control blood pressure.  If the doctor prescribes medicine remember to take it the way the doctor ordered.  Call the office if you cannot afford the medicine or if there are questions about it.     Notes:         This is a list of the screening recommended for you and due dates:  Health Maintenance  Topic Date  Due   DTaP/Tdap/Td vaccine (1 - Tdap) Never done   Zoster (Shingles) Vaccine (1 of 2) Never done   Yearly kidney health urinalysis for diabetes  07/27/2017   Eye exam for diabetics  12/10/2020   COVID-19 Vaccine (3 - 2023-24 season) 01/30/2022   Hemoglobin A1C  07/15/2022   Complete foot exam   01/13/2023   Yearly kidney function blood test for diabetes  03/21/2023   Medicare Annual Wellness Visit  06/13/2023   Cologuard (Stool DNA test)  07/25/2024   Pneumonia Vaccine  Completed   Flu Shot  Completed   Hepatitis C Screening: USPSTF Recommendation to screen - Ages 18-79 yo.  Completed   HPV Vaccine  Aged Out   Colon Cancer Screening  Discontinued    Advanced directives: Please bring a copy of your POA (Power of Attorney) and/or Living Will to your next appointment.   Conditions/risks identified: none  Next appointment: Follow up in one year for your annual wellness visit.   Preventive Care 74 Years and Older, Male  Preventive care refers to lifestyle  choices and visits with your health care provider that can promote health and wellness. What does preventive care include? A yearly physical exam. This is also called an annual well check. Dental exams once or twice a year. Routine eye exams. Ask your health care provider how often you should have your eyes checked. Personal lifestyle choices, including: Daily care of your teeth and gums. Regular physical activity. Eating a healthy diet. Avoiding tobacco and drug use. Limiting alcohol use. Practicing safe sex. Taking low doses of aspirin every day. Taking vitamin and mineral supplements as recommended by your health care provider. What happens during an annual well check? The services and screenings done by your health care provider during your annual well check will depend on your age, overall health, lifestyle risk factors, and family history of disease. Counseling  Your health care provider may ask you questions about your: Alcohol use. Tobacco use. Drug use. Emotional well-being. Home and relationship well-being. Sexual activity. Eating habits. History of falls. Memory and ability to understand (cognition). Work and work Statistician. Screening  You may have the following tests or measurements: Height, weight, and BMI. Blood pressure. Lipid and cholesterol levels. These may be checked every 5 years, or more frequently if you are over 36 years old. Skin check. Lung cancer screening. You may have this screening every year starting at age 22 if you have a 30-pack-year history of smoking and currently smoke or have quit within the past 15 years. Fecal occult blood test (FOBT) of the stool. You may have this test every year starting at age 71. Flexible sigmoidoscopy or colonoscopy. You may have a sigmoidoscopy every 5 years or a colonoscopy every 10 years starting at age 31. Prostate cancer screening. Recommendations will vary depending on your family history and other  risks. Hepatitis C blood test. Hepatitis B blood test. Sexually transmitted disease (STD) testing. Diabetes screening. This is done by checking your blood sugar (glucose) after you have not eaten for a while (fasting). You may have this done every 1-3 years. Abdominal aortic aneurysm (AAA) screening. You may need this if you are a current or former smoker. Osteoporosis. You may be screened starting at age 33 if you are at high risk. Talk with your health care provider about your test results, treatment options, and if necessary, the need for more tests. Vaccines  Your health care provider may recommend certain vaccines, such as: Influenza vaccine.  This is recommended every year. Tetanus, diphtheria, and acellular pertussis (Tdap, Td) vaccine. You may need a Td booster every 10 years. Zoster vaccine. You may need this after age 54. Pneumococcal 13-valent conjugate (PCV13) vaccine. One dose is recommended after age 55. Pneumococcal polysaccharide (PPSV23) vaccine. One dose is recommended after age 22. Talk to your health care provider about which screenings and vaccines you need and how often you need them. This information is not intended to replace advice given to you by your health care provider. Make sure you discuss any questions you have with your health care provider. Document Released: 06/14/2015 Document Revised: 02/05/2016 Document Reviewed: 03/19/2015 Elsevier Interactive Patient Education  2017 [place] Prevention in the Home Falls can cause injuries. They can happen to people of all ages. There are many things you can do to make your home safe and to help prevent falls. What can I do on the outside of my home? Regularly fix the edges of walkways and driveways and fix any cracks. Remove anything that might make you trip as you walk through a door, such as a raised step or threshold. Trim any bushes or trees on the path to your home. Use bright outdoor lighting. Clear  any walking paths of anything that might make someone trip, such as rocks or tools. Regularly check to see if handrails are loose or broken. Make sure that both sides of any steps have handrails. Any raised decks and porches should have guardrails on the edges. Have any leaves, snow, or ice cleared regularly. Use sand or salt on walking paths during winter. Clean up any spills in your garage right away. This includes oil or grease spills. What can I do in the bathroom? Use night lights. Install grab bars by the toilet and in the tub and shower. Do not use towel bars as grab bars. Use non-skid mats or decals in the tub or shower. If you need to sit down in the shower, use a plastic, non-slip stool. Keep the floor dry. Clean up any water that spills on the floor as soon as it happens. Remove soap buildup in the tub or shower regularly. Attach bath mats securely with double-sided non-slip rug tape. Do not have throw rugs and other things on the floor that can make you trip. What can I do in the bedroom? Use night lights. Make sure that you have a light by your bed that is easy to reach. Do not use any sheets or blankets that are too big for your bed. They should not hang down onto the floor. Have a firm chair that has side arms. You can use this for support while you get dressed. Do not have throw rugs and other things on the floor that can make you trip. What can I do in the kitchen? Clean up any spills right away. Avoid walking on wet floors. Keep items that you use a lot in easy-to-reach places. If you need to reach something above you, use a strong step stool that has a grab bar. Keep electrical cords out of the way. Do not use floor polish or wax that makes floors slippery. If you must use wax, use non-skid floor wax. Do not have throw rugs and other things on the floor that can make you trip. What can I do with my stairs? Do not leave any items on the stairs. Make sure that there  are handrails on both sides of the stairs and use them. Fix handrails  that are broken or loose. Make sure that handrails are as long as the stairways. Check any carpeting to make sure that it is firmly attached to the stairs. Fix any carpet that is loose or worn. Avoid having throw rugs at the top or bottom of the stairs. If you do have throw rugs, attach them to the floor with carpet tape. Make sure that you have a light switch at the top of the stairs and the bottom of the stairs. If you do not have them, ask someone to add them for you. What else can I do to help prevent falls? Wear shoes that: Do not have high heels. Have rubber bottoms. Are comfortable and fit you well. Are closed at the toe. Do not wear sandals. If you use a stepladder: Make sure that it is fully opened. Do not climb a closed stepladder. Make sure that both sides of the stepladder are locked into place. Ask someone to hold it for you, if possible. Clearly mark and make sure that you can see: Any grab bars or handrails. First and last steps. Where the edge of each step is. Use tools that help you move around (mobility aids) if they are needed. These include: Canes. Walkers. Scooters. Crutches. Turn on the lights when you go into a dark area. Replace any light bulbs as soon as they burn out. Set up your furniture so you have a clear path. Avoid moving your furniture around. If any of your floors are uneven, fix them. If there are any pets around you, be aware of where they are. Review your medicines with your doctor. Some medicines can make you feel dizzy. This can increase your chance of falling. Ask your doctor what other things that you can do to help prevent falls. This information is not intended to replace advice given to you by your health care provider. Make sure you discuss any questions you have with your health care provider. Document Released: 03/14/2009 Document Revised: 10/24/2015 Document Reviewed:  06/22/2014 Elsevier Interactive Patient Education  2017 [employer].

## 2022-06-12 NOTE — Progress Notes (Signed)
I connected with Larry [name] today by telephone and verified that I am speaking with the correct person using two identifiers. Location patient: home Location provider: work Persons participating in the virtual visit: Mekiah Cambridge, Glenna Durand LPN.   I discussed the limitations, risks, security and privacy concerns of performing an evaluation and management service by telephone and the availability of in person appointments. I also discussed with the patient that there may be a patient responsible charge related to this service. The patient expressed understanding and verbally consented to this telephonic visit.    Interactive audio and video telecommunications were attempted between this provider and patient, however failed, due to patient having technical difficulties OR patient did not have access to video capability.  We continued and completed visit with audio only.     Vital signs may be patient reported or missing.  Subjective:   Larry [name] is a 72 y.o. male who presents for Medicare Annual/Subsequent preventive examination.  Review of Systems     Cardiac Risk Factors include: advanced age (>20mn, >>62women);diabetes mellitus;dyslipidemia;hypertension;male gender     Objective:    Today's Vitals   06/12/22 1039  Weight: 165 lb (74.8 kg)  Height: '5\' 7"'$  (1.702 m)   Body mass index is 25.84 kg/m.     06/12/2022   10:43 AM 02/15/2022    4:00 PM 04/30/2021   11:24 AM 11/27/2020    2:48 PM 04/09/2020   10:34 AM 09/24/2019    8:40 PM 01/06/2016   12:47 PM  Advanced Directives  Does Patient Have a Medical Advance Directive? Yes Yes No No No No No  Type of AParamedicof [place] will Living will       Does patient want to make changes to medical advance directive?  No - Patient declined   Yes (MAU/Ambulatory/Procedural Areas - Information given)    Copy of [place]in Chart? No - copy requested        Would  patient like information on creating a medical advance directive?   No - Patient declined No - Patient declined  No - Patient declined     Current Medications (verified) Outpatient Encounter Medications as of 06/12/2022  Medication Sig   ACCU-CHEK AVIVA PLUS test strip TEST FASTING BLOOD SUGAR  ONCE DAILY IN THE MORNING   Accu-Chek FastClix Lancets MISC    atorvastatin (LIPITOR) 20 MG tablet TAKE 1 TABLET BY MOUTH DAILY (Patient taking differently: Take 20 mg by mouth daily.)   baclofen (LIORESAL) 20 MG tablet Take 1 tablet (20 mg total) by mouth 3 (three) times daily.   Blood Glucose Calibration (ACCU-CHEK AVIVA) SOLN    ferrous sulfate 325 (65 FE) MG tablet Take 1 tablet (325 mg total) by mouth daily with breakfast.   finasteride (PROSCAR) 5 MG tablet Take 1 tablet (5 mg total) by mouth daily.   Incontinence Supply Disposable (DEPEND ADJUSTABLE UNDERWEAR LG) MISC Use as needed.   Lancets Misc. (ACCU-CHEK FASTCLIX LANCET) KIT 1 Units by Does not apply route daily before breakfast.   metFORMIN (GLUCOPHAGE-XR) 750 MG 24 hr tablet TAKE 1 TABLET BY MOUTH DAILY  WITH BREAKFAST   mirabegron ER (MYRBETRIQ) 25 MG TB24 tablet Take 1 tablet (25 mg total) by mouth daily.   Multiple Vitamin (MULTIVITAMIN) tablet Take 1 tablet by mouth daily.   pantoprazole (PROTONIX) 40 MG tablet Take 1 tablet (40 mg total) by mouth 2 (two) times daily before a meal.   pregabalin (LYRICA) 100 MG  capsule Take 1 capsule (100 mg total) by mouth 2 (two) times daily.   traZODone (DESYREL) 50 MG tablet Take 1 tablet (50 mg total) by mouth at bedtime.   No facility-administered encounter medications on file as of 06/12/2022.    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   hx surgery 1996 with bilat gait abnormality since   Past Surgical History:  Procedure Laterality Date   BACK SURGERY     BIOPSY  02/16/2022   Procedure: BIOPSY;  Surgeon: Thornton Park, MD;  Location: [place];  Service: Gastroenterology;;   COLONOSCOPY     ESOPHAGOGASTRODUODENOSCOPY (EGD) WITH PROPOFOL N/A 02/16/2022   Procedure: ESOPHAGOGASTRODUODENOSCOPY (EGD) WITH PROPOFOL;  Surgeon: Thornton Park, MD;  Location: [place];  Service: Gastroenterology;  Laterality: N/A;   LUMBAR LAMINECTOMY     Family History  Problem Relation Age of Onset   Hypertension Mother    Liver cancer Father    Hypertension Sister    Social History   Socioeconomic History   Marital status: Divorced    Spouse name: Not on file   Number of children: 1   Years of education: 13   Highest education level: Not on file  Occupational History   Occupation: Disability  Tobacco Use   Smoking status: Never   Smokeless tobacco: Never  Vaping Use   Vaping Use: Never used  Substance and Sexual Activity   Alcohol use: No   Drug use: No   Sexual activity: Not Currently  Other Topics Concern   Not on file  Social History Narrative   Work or [place] alone, feels safe      Spiritual Beliefs: Christian      Lifestyle: works out with friend 3 days per week at the [employer]; diet is poor - likes to [employer] but like unhealthy options      Right-handed.    2 cups caffeine per day.   Social Determinants of Health   Financial Resource Strain: Low Risk  (06/12/2022)   Overall Financial Resource Strain (CARDIA)    Difficulty of Paying Living Expenses: Not hard at all  Food Insecurity: No Food Insecurity (06/12/2022)   Hunger Vital Sign    Worried About Running Out of Food in  the Last Year: Never true    Ran Out of Food in the Last Year: Never true  Transportation Needs: No Transportation Needs (06/12/2022)   PRAPARE - Hydrologist (Medical): No    Lack of Transportation (Non-Medical): No  Physical Activity: Inactive (06/12/2022)   Exercise Vital Sign    Days of Exercise per Week: 0 days    Minutes of Exercise per Session: 0 min  Stress: No Stress Concern Present (06/12/2022)   [place]    Feeling of Stress : Not at all  Social Connections: Moderately Integrated (04/30/2021)   Social Connection and Isolation Panel [NHANES]    Frequency of Communication with Friends  and Family: Twice a week    Frequency of Social Gatherings with Friends and Family: Twice a week    Attends Religious Services: More than 4 times per year    Active Member of [employer] or Organizations: Yes    Attends Music therapist: More than 4 times per year    Marital Status: Divorced    Tobacco Counseling Counseling given: Not Answered   Clinical Intake:  Pre-visit preparation completed: Yes  Pain : No/denies pain     Nutritional Status: BMI 25 -29 Overweight Nutritional Risks: None Diabetes: Yes  How often do you need to have someone help you when you read instructions, pamphlets, or other written materials from your doctor or pharmacy?: 1 - Never  Diabetic? Yes Nutrition Risk Assessment:  Has the patient had any N/V/D within the last 2 months?  No  Does the patient have any non-healing wounds?  No  Has the patient had any unintentional weight loss or weight gain?  No   Diabetes:  Is the patient diabetic?  Yes  If diabetic, was a CBG obtained today?  No  Did the patient bring in their glucometer from home?  No  How often do you monitor your CBG's? Once every 2 months.   Financial Strains and Diabetes Management:  Are you having any financial strains with the  device, your supplies or your medication? No .  Does the patient want to be seen by Chronic Care Management for management of their diabetes?  No  Would the patient like to be referred to a Nutritionist or for Diabetic Management?  No   Diabetic Exams:  Diabetic Eye Exam: Overdue for diabetic eye exam. Pt has been advised about the importance in completing this exam. Patient advised to call and schedule an eye exam. Diabetic Foot Exam: Completed 07/25/2021   Interpreter Needed?: No  Information entered by :: NAllen LPN   Activities of Daily Living    06/12/2022   10:45 AM 02/15/2022    4:00 PM  In your present state of health, do you have any difficulty performing the following activities:  Hearing? 0 0  Vision? 0 0  Difficulty concentrating or making decisions? 0 0  Walking or climbing stairs? 1 1  Dressing or bathing? 0 0  Doing errands, shopping? 0 1  Comment  Uses instacart  Preparing Food and eating ? N   Using the Toilet? N   In the past six months, have you accidently leaked urine? Y   Do you have problems with loss of bowel control? N   Managing your Medications? N   Managing your Finances? N   Housekeeping or managing your Housekeeping? N     Patient Care Team: Nche, Charlene Brooke, NP as PCP - General (Internal Medicine) Kristeen Miss, MD as Consulting Physician (Neurosurgery) Kristeen Miss, MD as Consulting Physician (Neurosurgery) Germaine Pomfret, [facility] as Pharmacist (Pharmacist) Lazaro Arms, RN as [place] Management  Indicate any recent Medical Services you may have received from other than [facility] providers in the past year (date may be approximate).     Assessment:   This is a routine wellness examination for [place].  Hearing/Vision screen Vision Screening - Comments:: No regular eye exams, [facility]  Dietary issues and exercise activities discussed: Current Exercise Habits: The patient does not participate in regular  exercise at present   Goals Addressed             This Visit's Progress  Patient Stated       06/12/2022, wants to work with PT again       Depression Screen    06/12/2022   10:45 AM 04/30/2021   11:27 AM 04/30/2021   11:23 AM 11/19/2020    1:19 PM 07/17/2020    2:01 PM 04/09/2020   10:36 AM 01/18/2020   12:25 PM  PHQ 2/9 Scores  PHQ - 2 Score 0 0 0 0 0 0 0    Fall Risk    06/12/2022   10:44 AM 04/30/2021   11:24 AM 04/09/2020   10:35 AM 01/09/2020    9:15 PM 09/14/2019   10:50 AM  Fall Risk   Falls in the past year? 1 0 0 1 0  Comment with transferring      Number falls in past yr: 1 0 0 0 0  Injury with Fall? 0 0 0 0 0  Risk for fall due to : History of fall(s);Impaired balance/gait;Impaired mobility;Medication side effect  Impaired mobility History of fall(s);Impaired mobility;Impaired balance/gait   Follow up Falls evaluation completed;Education provided;Falls prevention discussed Falls evaluation completed Falls prevention discussed Falls evaluation completed   Comment  wheelchair       FALL RISK PREVENTION PERTAINING TO THE HOME:  Any stairs in or around the home? No  If so, are there any without handrails?  N/a Home free of loose throw rugs in walkways, pet beds, electrical cords, etc? Yes  Adequate lighting in your home to reduce risk of falls? Yes   ASSISTIVE DEVICES UTILIZED TO PREVENT FALLS:  Life alert? No  Use of a cane, walker or w/c? Yes  Grab bars in the bathroom? Yes  Shower chair or bench in shower? Yes  Elevated toilet seat or a handicapped toilet? Yes   TIMED UP AND GO:  Was the test performed? No .      Cognitive Function:        06/12/2022   10:45 AM  6CIT Screen  What Year? 0 points  What month? 0 points  What time? 0 points  Count back from 20 0 points  Months in reverse 2 points  Repeat phrase 0 points  Total Score 2 points    Immunizations Immunization History  Administered Date(s) Administered   Fluad Quad(high  Dose 65+) 03/24/2019, 05/02/2020, 03/20/2022   Influenza Split 02/05/2012   Influenza, High Dose Seasonal PF 03/05/2014, 05/17/2015, 02/15/2017, 06/10/2018   Influenza,inj,Quad PF,6+ Mos 05/09/2013   PFIZER(Purple Top)SARS-COV-2 Vaccination 01/14/2020, 02/04/2020   PPD Test 01/05/2016   Pneumococcal Conjugate-13 03/05/2014   Pneumococcal Polysaccharide-23 05/17/2015    TDAP status: Due, Education has been provided regarding the importance of this vaccine. Advised may receive this vaccine at local pharmacy or Health Dept. Aware to provide a copy of the vaccination record if obtained from local pharmacy or Health Dept. Verbalized acceptance and understanding.  Flu Vaccine status: Up to date  Pneumococcal vaccine status: Up to date  Covid-19 vaccine status: Completed vaccines  Qualifies for Shingles Vaccine? Yes   Zostavax completed No   Shingrix Completed?: No.    Education has been provided regarding the importance of this vaccine. Patient has been advised to call insurance company to determine out of pocket expense if they have not yet received this vaccine. Advised may also receive vaccine at local pharmacy or Health Dept. Verbalized acceptance and understanding.  Screening Tests Health Maintenance  Topic Date Due   DTaP/Tdap/Td (1 - Tdap) Never done   Zoster Vaccines- Shingrix (1 of  2) Never done   Diabetic kidney evaluation - Urine ACR  07/27/2017   OPHTHALMOLOGY EXAM  12/10/2020   COVID-19 Vaccine (3 - 2023-24 season) 01/30/2022   Medicare Annual Wellness (AWV)  04/30/2022   HEMOGLOBIN A1C  07/15/2022   FOOT EXAM  01/13/2023   Diabetic kidney evaluation - eGFR measurement  03/21/2023   Fecal DNA (Cologuard)  07/25/2024   Pneumonia Vaccine 53+ Years old  Completed   INFLUENZA VACCINE  Completed   Hepatitis C Screening  Completed   HPV VACCINES  Aged Out   COLONOSCOPY (Pts 45-97yr Insurance coverage will need to be confirmed)  Discontinued    Health Maintenance  Health  Maintenance Due  Topic Date Due   DTaP/Tdap/Td (1 - Tdap) Never done   Zoster Vaccines- Shingrix (1 of 2) Never done   Diabetic kidney evaluation - Urine ACR  07/27/2017   OPHTHALMOLOGY EXAM  12/10/2020   COVID-19 Vaccine (3 - 2023-24 season) 01/30/2022   Medicare Annual Wellness (AWV)  04/30/2022    Colorectal cancer screening: Type of screening: Cologuard. Completed 07/25/2021. Repeat every 3 years  Lung Cancer Screening: (Low Dose CT Chest recommended if Age 74-80years, 30 pack-year currently smoking OR have quit w/in 15years.) does not qualify.   Lung Cancer Screening Referral: no  Additional Screening:  Hepatitis C Screening: does qualify; Completed 05/22/2013  Vision Screening: Recommended annual ophthalmology exams for early detection of glaucoma and other disorders of the eye. Is the patient up to date with their annual eye exam?  Yes  Who is the provider or what is the name of the office in which the patient attends annual eye exams? [facility] If pt is not established with a provider, would they like to be referred to a provider to establish care? No .   Dental Screening: Recommended annual dental exams for proper oral hygiene  Community Resource Referral / Chronic Care Management: CRR required this visit?  No   CCM required this visit?  No      Plan:     I have personally reviewed and noted the following in the patient's chart:   Medical and social history Use of alcohol, tobacco or illicit drugs  Current medications and supplements including opioid prescriptions. Patient is not currently taking opioid prescriptions. Functional ability and status Nutritional status Physical activity Advanced directives List of other physicians Hospitalizations, surgeries, and ER visits in previous 12 months Vitals Screenings to include cognitive, depression, and falls Referrals and appointments  In addition, I have reviewed and discussed with patient certain  preventive protocols, quality metrics, and best practice recommendations. A written personalized care plan for preventive services as well as general preventive health recommendations were provided to patient.     NKellie Simmering LPN   19/53/2023  Nurse Notes: none  Due to this being a virtual visit, the after visit summary with patients personalized plan was offered to patient via mail or my-chart. Patient would like to access on my-chart

## 2022-06-15 ENCOUNTER — Other Ambulatory Visit: Payer: Self-pay | Admitting: Nurse Practitioner

## 2022-06-15 DIAGNOSIS — M4802 Spinal stenosis, cervical region: Secondary | ICD-10-CM

## 2022-06-15 DIAGNOSIS — M48062 Spinal stenosis, lumbar region with neurogenic claudication: Secondary | ICD-10-CM

## 2022-06-16 ENCOUNTER — Other Ambulatory Visit: Payer: Self-pay | Admitting: Nurse Practitioner

## 2022-06-16 DIAGNOSIS — G4700 Insomnia, unspecified: Secondary | ICD-10-CM

## 2022-06-18 ENCOUNTER — Ambulatory Visit: Payer: Medicare Other

## 2022-06-18 DIAGNOSIS — I10 Essential (primary) hypertension: Secondary | ICD-10-CM

## 2022-06-18 DIAGNOSIS — K264 Chronic or unspecified duodenal ulcer with hemorrhage: Secondary | ICD-10-CM

## 2022-06-18 NOTE — Progress Notes (Signed)
Care Management & Coordination Services Pharmacy Note  06/18/2022 Name:  Larry [name] MRN:  [id number] DOB:  [birth year]  Summary: Patient presents for follow-up consult.   -Patient's blood pressure medication was discontinued at hospital discharge on 02/15/22, but patient resumed this medication at discharge and continues to take this medication daily.   - Twice daily PPI recommended for 10 week treatment by GI. Patient has upcoming EGD in February.   Recommendations/Changes made from today's visit: -Recommend decreasing pantoprazole to 40 mg daily.    Follow up plan: CPP follow-up 3 months  -Patient to monitor blood pressure twice weekly and record.    Subjective: Larry [name] is an 72 y.o. year old male who is a primary patient of Nche, Charlene Brooke, NP.  The care coordination team was consulted for assistance with disease management and care coordination needs.    Engaged with patient by telephone for follow up visit.  Recent office visits: 03/20/22: Patient presented to Wilfred Lacy, NP for hospital follow-up.  01/12/2022 Wilfred Lacy NP (PCP) No Medication Changes noted,Ambulatory referral to [place] consult visits: None ID   Hospital visits: Admitted to the hospital on 02/15/2022 due to Gastrointestinal hemorrhage. Discharge date was 02/18/2022. Discharged from [facility].   Objective:  Lab Results  Component Value Date   CREATININE 1.38 03/20/2022   BUN 40 (H) 03/20/2022   GFR 50.69 (L) 03/20/2022   GFRNONAA >60 02/18/2022   GFRAA >60 09/30/2019   NA 140 03/20/2022   K 3.9 03/20/2022   CALCIUM 9.5 03/20/2022   CO2 25 03/20/2022   GLUCOSE 185 (H) 03/20/2022    Lab Results  Component Value Date/Time   HGBA1C 6.6 (H) 01/12/2022 02:42 PM   HGBA1C 6.5 07/14/2021 02:44 PM   GFR 50.69 (L) 03/20/2022 11:19 AM   GFR 73.82 01/12/2022 02:42 PM   MICROALBUR 4.7 (H) 07/27/2016 12:57 PM    Last diabetic Eye exam:  Lab  Results  Component Value Date/Time   HMDIABEYEEXA No Retinopathy 12/11/2019 12:00 AM    Last diabetic Foot exam: No results found for: "HMDIABFOOTEX"   Lab Results  Component Value Date   CHOL 116 01/12/2022   HDL 34.90 (L) 01/12/2022   LDLCALC 58 01/12/2022   LDLDIRECT 64.0 01/09/2021   TRIG 116.0 01/12/2022   CHOLHDL 3 01/12/2022       Latest Ref Rng & Units 03/20/2022   11:19 AM 02/15/2022   10:52 AM 01/12/2022    2:42 PM  Hepatic Function  Total Protein 6.5 - 8.1 g/dL  5.8  6.9   Albumin 3.5 - 5.2 g/dL 4.1  3.6  4.7   AST 15 - 41 U/L  26  24   ALT 0 - 44 U/L  18  14   Alk Phosphatase 38 - 126 U/L  55  79   Total Bilirubin 0.3 - 1.2 mg/dL  0.3  0.4     Lab Results  Component Value Date/Time   TSH 1.15 07/31/2020 12:21 PM   TSH 1.46 03/24/2019 03:55 PM       Latest Ref Rng & Units 03/20/2022   11:19 AM 02/18/2022    6:32 AM 02/17/2022   10:35 AM  CBC  WBC 4.0 - 10.5 K/uL 6.3  6.7  6.4   Hemoglobin 13.0 - 17.0 g/dL 10.4  9.9  10.0   Hematocrit 39.0 - 52.0 % 32.1  28.9  29.0   Platelets 150.0 - 400.0 K/uL 489.0  308  304  Lab Results  Component Value Date/Time   VITAMINB12 375 06/23/2018 08:58 AM   VITAMINB12 748 01/01/2016 11:39 AM    Clinical ASCVD: No  The ASCVD Risk score (Arnett DK, et al., 2019) failed to calculate for the following reasons:   The systolic blood pressure is missing   The valid total cholesterol range is 130 to 320 mg/dL   Unable to determine if patient is Non-Hispanic African American       06/12/2022   10:45 AM 04/30/2021   11:27 AM 04/30/2021   11:23 AM  Depression screen PHQ 2/9  Decreased Interest 0 0 0  Down, Depressed, Hopeless 0 0 0  PHQ - 2 Score 0 0 0     Social History   Tobacco Use  Smoking Status Never  Smokeless Tobacco Never   BP Readings from Last 3 Encounters:  03/20/22 120/60  03/11/22 126/76  02/17/22 135/87   Pulse Readings from Last 3 Encounters:  03/20/22 84  03/11/22 64  02/17/22 91   Wt  Readings from Last 3 Encounters:  06/12/22 165 lb (74.8 kg)  03/20/22 155 lb (70.3 kg)  03/11/22 155 lb 4 oz (70.4 kg)   BMI Readings from Last 3 Encounters:  06/12/22 25.84 kg/m  03/20/22 24.28 kg/m  03/11/22 24.32 kg/m    Allergies  Allergen Reactions   Nsaids Other (See Comments)    Hx of GI bleed   Rosuvastatin Other (See Comments)    Leg swelling    Medications Reviewed Today     Reviewed by Kellie Simmering, LPN (Licensed Practical Nurse) on 06/12/22 at 107  Med List Status: <None>   Medication Order Taking? Sig Documenting Provider Last Dose Status Informant  ACCU-CHEK AVIVA PLUS test strip [id number] Yes TEST FASTING BLOOD SUGAR  ONCE DAILY IN THE MORNING Nche, Charlene Brooke, NP Taking Active Self  Accu-Chek FastClix Lancets [place] [id number] Yes  [provider] Taking Active Self  atorvastatin (LIPITOR) 20 MG tablet [id number] Yes TAKE 1 TABLET BY MOUTH DAILY  Patient taking differently: Take 20 mg by mouth daily.   Nche, Charlene Brooke, NP Taking Active Self, Pharmacy Records  baclofen (LIORESAL) 20 MG tablet [id number] Yes Take 1 tablet (20 mg total) by mouth 3 (three) times daily. Darliss Cheney, MD Taking Active            Med Note Cheryll Cockayne, Alfonse Spruce Mar 18, 2022  2:39 PM) Patient takes it two times a day  Blood Glucose Calibration (ACCU-CHEK AVIVA) Bailey Mech [id number] Yes  [provider] Taking Active Self  ferrous sulfate 325 (65 FE) MG tablet [id number] Yes Take 1 tablet (325 mg total) by mouth daily with breakfast. Nche, Charlene Brooke, NP Taking Active Self  finasteride (PROSCAR) 5 MG tablet [id number] Yes Take 1 tablet (5 mg total) by mouth daily. Nche, Charlene Brooke, NP Taking Active Self, Pharmacy Records  Incontinence Supply Disposable (DEPEND ADJUSTABLE UNDERWEAR LG) MISC [id number] Yes Use as needed. Marcial Pacas, MD Taking Active Self  Lancets Misc. (ACCU-CHEK FASTCLIX LANCET) KIT [id number] Yes 1 Units by Does not apply route daily before breakfast.  Nche, Charlene Brooke, NP Taking Active Self  metFORMIN (GLUCOPHAGE-XR) 750 MG 24 hr tablet [id number] Yes TAKE 1 TABLET BY MOUTH DAILY  WITH BREAKFAST Nche, Charlene Brooke, NP Taking Active   mirabegron ER (MYRBETRIQ) 25 MG TB24 tablet [id number] Yes Take 1 tablet (25 mg total) by mouth daily. Kennyth Arnold, FNP Taking Active Self, Pharmacy Records  Multiple Vitamin (MULTIVITAMIN) tablet  [id number] Yes Take 1 tablet by mouth daily. [provider] Taking Active Self  pantoprazole (PROTONIX) 40 MG tablet [id number] Yes Take 1 tablet (40 mg total) by mouth 2 (two) times daily before a meal. Nche, Charlene Brooke, NP Taking Active   pregabalin (LYRICA) 100 MG capsule [id number] Yes Take 1 capsule (100 mg total) by mouth 2 (two) times daily. Flossie Buffy, NP Taking Active Self, Pharmacy Records  traZODone (DESYREL) 50 MG tablet [id number] Yes Take 1 tablet (50 mg total) by mouth at bedtime. Nche, Charlene Brooke, NP Taking Active Self, Pharmacy Records            SDOH:  (Social Determinants of Health) assessments and interventions performed: Yes SDOH Interventions    Flowsheet Row Clinical Support from 06/12/2022 in [place] from 03/18/2022 in [place] ED to Hosp-Admission (Discharged) from 02/15/2022 in [place] [facility] from 07/03/2021 in [place] from 04/30/2021 in [place] Management from 01/13/2021 in [place] Interventions Intervention Not Indicated Intervention Not Indicated -- -- Intervention Not Indicated --  Housing Interventions -- -- Intervention Not Indicated -- Intervention Not Indicated --  Transportation Interventions Intervention Not Indicated Intervention Not Indicated -- -- Intervention Not  Indicated --  Financial Strain Interventions Intervention Not Indicated -- -- Intervention Not Indicated -- Intervention Not Indicated  Physical Activity Interventions Patient Refused, Other (Comments) -- -- -- Intervention Not Indicated --  Stress Interventions Intervention Not Indicated -- -- -- Intervention Not Indicated --  Social Connections Interventions -- -- -- -- Intervention Not Indicated --      SDOH Screenings   Food Insecurity: No Food Insecurity (06/12/2022)  Housing: Low Risk  (02/15/2022)  Transportation Needs: No Transportation Needs (06/12/2022)  Utilities: Not At Risk (02/15/2022)  Alcohol Screen: Low Risk  (04/30/2021)  Depression (PHQ2-9): Low Risk  (06/12/2022)  Financial Resource Strain: Low Risk  (06/12/2022)  Physical Activity: Inactive (06/12/2022)  Social Connections: Moderately Integrated (04/30/2021)  Stress: No Stress Concern Present (06/12/2022)  Tobacco Use: Low Risk  (06/12/2022)    Medication Assistance: None required.  Patient affirms current coverage meets needs.  Medication Access: Within the past 30 days, how often has patient missed a dose of medication? None Is a pillbox or other method used to improve adherence? Yes  Factors that may affect medication adherence? no barriers identified Are meds synced by current pharmacy? No  Are meds delivered by current pharmacy? Yes  Does patient experience delays in picking up medications due to transportation concerns? No   [facility] Services Reviewed: Is patient disadvantaged to use [facility]?: Yes  Current Rx insurance plan: [facility] Name and location of Current pharmacy:  [facility] ([place], [address] [place] [place] [address] Phone: [phone] Fax: [phone]  [place], [place] [address] Phone: [phone] Fax:  [place] [address] [place]), [place] [address] [place]) [place] [address] Phone: [phone] Fax: [phone]  [facility] services reviewed with patient today?: Yes  Patient requests to transfer care to [facility]?: No  Reason patient declined to change pharmacies: Disadvantaged due to  insurance/mail order  Compliance/Adherence/Medication fill history: Care Gaps: COVID-19 Vaccine Ophthalmology Exam Influenza Vaccine  Star-Rating Drugs: Atorvastatin 20 mg last filled 12/27/2021 90 day supply at [facility]  Metformin 750 mg last filled 02/28/2022 90 day supply at [facility].    Assessment/Plan  Hypertension (BP goal <140/90) -Controlled -Current treatment: Olmesartan-Amlodipine-HCTZ 40-10-12.5 mg daily: Appropriate, Effective, Safe, Accessible -Medications previously tried: NA  -Patient's blood pressure medication was discontinued at hospital discharge on 02/15/22, but patient resumed this medication at discharge and continues to take this medication daily.  -Current home readings: 179/70, on repeat 105/44.    -Current dietary habits: Limits salt intake. -Current exercise habits: PT multiple times weekly -Denies hypotensive/hypertensive symptoms -Recommended to continue current medication -Monitor blood pressure twice weekly and record.   Hyperlipidemia: (LDL goal < 100) -Controlled -Current treatment: Atorvastatin 20 mg daily -Medications previously tried: NA  -Educated on Benefits of statin for ASCVD risk reduction; -Recommended to continue current medication  Diabetes (A1c goal <7%) -Controlled -Current medications: Metformin XR 750 mg daily: Appropriate, Effective, Safe, Accessible  -Medications previously tried: NA  -Current home glucose readings fasting glucose: 113 -Denies hypoglycemic/hyperglycemic symptoms -Educated on A1c and blood sugar goals; -Counseled to check feet daily and  get yearly eye exams -Recommended to continue current medication  History of gastric ulcer with GI Bleed (Goal: Prevent recurrence) -Controlled -Current treatment  Pantoprazole 40 mg twice daily  -Medications previously tried: NA - Twice daily PPI recommended for 10 week treatment by GI. Patient has upcoming EGD in February.  -Recommend decreasing pantoprazole to 40 mg daily.   Junius Argyle, PharmD, [place], CPP Clinical Pharmacist Practitioner  [facility]  [phone]

## 2022-06-19 ENCOUNTER — Ambulatory Visit (INDEPENDENT_AMBULATORY_CARE_PROVIDER_SITE_OTHER): Payer: Medicare Other | Admitting: Nurse Practitioner

## 2022-06-19 ENCOUNTER — Encounter: Payer: Self-pay | Admitting: Nurse Practitioner

## 2022-06-19 VITALS — BP 130/82 | HR 75 | Temp 98.2°F | Ht 67.0 in | Wt 166.2 lb

## 2022-06-19 DIAGNOSIS — Z87828 Personal history of other (healed) physical injury and trauma: Secondary | ICD-10-CM | POA: Diagnosis not present

## 2022-06-19 DIAGNOSIS — G35 Multiple sclerosis: Secondary | ICD-10-CM | POA: Diagnosis not present

## 2022-06-19 DIAGNOSIS — E785 Hyperlipidemia, unspecified: Secondary | ICD-10-CM | POA: Diagnosis not present

## 2022-06-19 DIAGNOSIS — E1142 Type 2 diabetes mellitus with diabetic polyneuropathy: Secondary | ICD-10-CM

## 2022-06-19 DIAGNOSIS — M48061 Spinal stenosis, lumbar region without neurogenic claudication: Secondary | ICD-10-CM | POA: Diagnosis not present

## 2022-06-19 DIAGNOSIS — M4802 Spinal stenosis, cervical region: Secondary | ICD-10-CM | POA: Diagnosis not present

## 2022-06-19 DIAGNOSIS — N3281 Overactive bladder: Secondary | ICD-10-CM | POA: Diagnosis not present

## 2022-06-19 DIAGNOSIS — M5416 Radiculopathy, lumbar region: Secondary | ICD-10-CM

## 2022-06-19 DIAGNOSIS — M4804 Spinal stenosis, thoracic region: Secondary | ICD-10-CM | POA: Diagnosis not present

## 2022-06-19 DIAGNOSIS — G629 Polyneuropathy, unspecified: Secondary | ICD-10-CM

## 2022-06-19 DIAGNOSIS — I10 Essential (primary) hypertension: Secondary | ICD-10-CM | POA: Diagnosis not present

## 2022-06-19 DIAGNOSIS — D509 Iron deficiency anemia, unspecified: Secondary | ICD-10-CM

## 2022-06-19 DIAGNOSIS — E1169 Type 2 diabetes mellitus with other specified complication: Secondary | ICD-10-CM

## 2022-06-19 DIAGNOSIS — G8220 Paraplegia, unspecified: Secondary | ICD-10-CM | POA: Diagnosis not present

## 2022-06-19 DIAGNOSIS — E1151 Type 2 diabetes mellitus with diabetic peripheral angiopathy without gangrene: Secondary | ICD-10-CM

## 2022-06-19 LAB — CBC
HCT: 37.5 % — ABNORMAL LOW (ref 39.0–52.0)
Hemoglobin: 12.6 g/dL — ABNORMAL LOW (ref 13.0–17.0)
MCHC: 33.5 g/dL (ref 30.0–36.0)
MCV: 88.3 fl (ref 78.0–100.0)
Platelets: 359 10*3/uL (ref 150.0–400.0)
RBC: 4.25 Mil/uL (ref 4.22–5.81)
RDW: 18.3 % — ABNORMAL HIGH (ref 11.5–15.5)
WBC: 4.3 10*3/uL (ref 4.0–10.5)

## 2022-06-19 LAB — HEMOGLOBIN A1C: Hgb A1c MFr Bld: 7.3 % — ABNORMAL HIGH (ref 4.6–6.5)

## 2022-06-19 LAB — RENAL FUNCTION PANEL
Albumin: 4.6 g/dL (ref 3.5–5.2)
BUN: 21 mg/dL (ref 6–23)
CO2: 30 mEq/L (ref 19–32)
Calcium: 9.7 mg/dL (ref 8.4–10.5)
Chloride: 104 mEq/L (ref 96–112)
Creatinine, Ser: 1.07 mg/dL (ref 0.40–1.50)
GFR: 68.67 mL/min (ref >60.00)
Glucose, Bld: 159 mg/dL — ABNORMAL HIGH (ref 70–99)
Phosphorus: 3.6 mg/dL (ref 2.3–4.6)
Potassium: 4.1 mEq/L (ref 3.5–5.1)
Sodium: 145 mEq/L (ref 135–145)

## 2022-06-19 LAB — LDL CHOLESTEROL, DIRECT: Direct LDL: 51 mg/dL

## 2022-06-19 NOTE — Progress Notes (Addendum)
Established Patient Visit  Patient: Larry [name]   DOB: Oct 22, 1950   72 y.o. Male  MRN: [id number] Visit Date: 07/09/2022  Subjective:    Chief Complaint  Patient presents with   Office Visit    HTN/ DM/ Hyperlipidemia Checks BP every now and then  Doesn't check BS C/o leg & feet swelling , wants a PT referral    HPI Paraplegia ([place]) Completed home PT in 03/2023 Wants to resume PT to help maintain mobility, ROM, and strength  Hyperlipidemia associated with type 2 diabetes mellitus ([place]) Repeat lipid panel Maintain atorvastatin dose  DM (diabetes mellitus) ([place]) Advised to schedule appt for annual DM eye exam. No adverse effects with metformin No glucose check at home Repeat hgbA1c, urine microalbumin, bmp and lipid panel  Hypertension BP at goal LE edema due to diet non compliance, no ulcers, no cellulitis. BP Readings from Last 3 Encounters:  06/19/22 130/82  03/20/22 120/60  03/11/22 126/76    Maintain olmesartan, amlodipine and HCTZ dose Advised to maintain DASH diet and use of compression stocking Repeat BMP  Iron deficiency anemia Upcoming appt for EGD Current use of PPI Repeat CBC and iron panel: improved  Wt Readings from Last 3 Encounters:  06/19/22 166 lb 3.2 oz (75.4 kg)  06/12/22 165 lb (74.8 kg)  03/20/22 155 lb (70.3 kg)    Reviewed medical, surgical, and social history today  Medications: Outpatient Medications Prior to Visit  Medication Sig   ACCU-CHEK AVIVA PLUS test strip TEST FASTING BLOOD SUGAR  ONCE DAILY IN THE MORNING   Accu-Chek FastClix Lancets MISC    baclofen (LIORESAL) 20 MG tablet Take 1 tablet (20 mg total) by mouth 3 (three) times daily.   Blood Glucose Calibration (ACCU-CHEK AVIVA) SOLN    ferrous sulfate 325 (65 FE) MG tablet Take 1 tablet (325 mg total) by mouth daily with breakfast.   finasteride (PROSCAR) 5 MG tablet Take 1 tablet (5 mg total) by mouth daily.   Incontinence Supply Disposable (DEPEND  ADJUSTABLE UNDERWEAR LG) MISC Use as needed.   Lancets Misc. (ACCU-CHEK FASTCLIX LANCET) KIT 1 Units by Does not apply route daily before breakfast.   metFORMIN (GLUCOPHAGE-XR) 750 MG 24 hr tablet TAKE 1 TABLET BY MOUTH DAILY  WITH BREAKFAST   Multiple Vitamin (MULTIVITAMIN) tablet Take 1 tablet by mouth daily.   traZODone (DESYREL) 50 MG tablet TAKE 1 TABLET BY MOUTH AT  BEDTIME   [DISCONTINUED] atorvastatin (LIPITOR) 20 MG tablet TAKE 1 TABLET BY MOUTH DAILY (Patient taking differently: Take 20 mg by mouth daily.)   [DISCONTINUED] mirabegron ER (MYRBETRIQ) 25 MG TB24 tablet Take 1 tablet (25 mg total) by mouth daily.   [DISCONTINUED] Olmesartan-amLODIPine-HCTZ 40-10-12.5 MG TABS Take 1 tablet by mouth daily.   [DISCONTINUED] pantoprazole (PROTONIX) 40 MG tablet Take 1 tablet (40 mg total) by mouth 2 (two) times daily before a meal.   [DISCONTINUED] pregabalin (LYRICA) 100 MG capsule Take 1 capsule (100 mg total) by mouth 2 (two) times daily.   No facility-administered medications prior to visit.   Reviewed past medical and social history.   ROS per HPI above      Objective:  BP 130/82 (BP Location: Right Arm, Patient Position: Sitting, Cuff Size: Normal)   Pulse 75   Temp 98.2 F (36.8 C) (Temporal)   Ht '5\' 7"'$  (1.702 m)   Wt 166 lb 3.2 oz (75.4 kg)   SpO2 94%  BMI 26.03 kg/m      Physical Exam Cardiovascular:     Rate and Rhythm: Normal rate and regular rhythm.     Pulses: Normal pulses.     Heart sounds: Normal heart sounds.  Pulmonary:     Effort: Pulmonary effort is normal.     Breath sounds: Normal breath sounds.  Musculoskeletal:        General: No tenderness.     Right lower leg: Edema present.     Left lower leg: Edema present.  Skin:    Findings: No erythema.  Neurological:     Mental Status: He is alert and oriented to person, place, and time.  Psychiatric:        Mood and Affect: Mood normal.        Behavior: Behavior normal.        Thought Content:  Thought content normal.     Results for orders placed or performed in visit on 06/19/22  Renal Function Panel  Result Value Ref Range   Sodium 145 135 - 145 mEq/L   Potassium 4.1 3.5 - 5.1 mEq/L   Chloride 104 96 - 112 mEq/L   CO2 30 19 - 32 mEq/L   Albumin 4.6 3.5 - 5.2 g/dL   BUN 21 6 - 23 mg/dL   Creatinine, Ser 1.07 0.40 - 1.50 mg/dL   Glucose, Bld 159 (H) 70 - 99 mg/dL   Phosphorus 3.6 2.3 - 4.6 mg/dL   GFR 68.67 >60.00 mL/min   Calcium 9.7 8.4 - 10.5 mg/dL  Hemoglobin A1c  Result Value Ref Range   Hgb A1c MFr Bld 7.3 (H) 4.6 - 6.5 %  Iron, TIBC and Ferritin Panel  Result Value Ref Range   Iron 83 50 - 180 mcg/dL   TIBC 296 250 - 425 mcg/dL (calc)   %SAT 28 20 - 48 % (calc)   Ferritin 20 (L) 24 - 380 ng/mL  Direct LDL  Result Value Ref Range   Direct LDL 51.0 mg/dL  CBC  Result Value Ref Range   WBC 4.3 4.0 - 10.5 K/uL   RBC 4.25 4.22 - 5.81 Mil/uL   Platelets 359.0 150.0 - 400.0 K/uL   Hemoglobin 12.6 (L) 13.0 - 17.0 g/dL   HCT 37.5 (L) 39.0 - 52.0 %   MCV 88.3 78.0 - 100.0 fl   MCHC 33.5 30.0 - 36.0 g/dL   RDW 18.3 (H) 11.5 - 15.5 %      Assessment & Plan:    Problem List Items Addressed This Visit       Cardiovascular and Mediastinum   Hypertension - Primary    BP at goal LE edema due to diet non compliance, no ulcers, no cellulitis. BP Readings from Last 3 Encounters:  06/19/22 130/82  03/20/22 120/60  03/11/22 126/76    Maintain olmesartan, amlodipine and HCTZ dose Advised to maintain DASH diet and use of compression stocking Repeat BMP      Relevant Medications   Olmesartan-amLODIPine-HCTZ 40-10-12.5 MG TABS   atorvastatin (LIPITOR) 20 MG tablet   Other Relevant Orders   Renal Function Panel (Completed)     Endocrine   DM (diabetes mellitus) ([place])    Advised to schedule appt for annual DM eye exam. No adverse effects with metformin No glucose check at home Repeat hgbA1c, urine microalbumin, bmp and lipid panel      Relevant  Medications   Olmesartan-amLODIPine-HCTZ 40-10-12.5 MG TABS   atorvastatin (LIPITOR) 20 MG tablet   pregabalin (LYRICA) 100  MG capsule   Other Relevant Orders   Hemoglobin A1c (Completed)   Hyperlipidemia associated with type 2 diabetes mellitus ([facility])    Repeat lipid panel Maintain atorvastatin dose      Relevant Medications   Olmesartan-amLODIPine-HCTZ 40-10-12.5 MG TABS   atorvastatin (LIPITOR) 20 MG tablet   Other Relevant Orders   Direct LDL (Completed)     Nervous and Auditory   Paraplegia ([place])    Completed home PT in 03/2023 Wants to resume PT to help maintain mobility, ROM, and strength      Relevant Orders   Ambulatory referral to [place]   Peripheral neuropathy   Relevant Medications   pregabalin (LYRICA) 100 MG capsule   Spinal stenosis of lumbar region with radiculopathy   Relevant Medications   pregabalin (LYRICA) 100 MG capsule   Other Relevant Orders   Ambulatory referral to [place]     Genitourinary   Overactive bladder   Relevant Medications   mirabegron ER (MYRBETRIQ) 25 MG TB24 tablet     Other   History of spinal cord injury   Relevant Orders   Ambulatory referral to [place] deficiency anemia    Upcoming appt for EGD Current use of PPI Repeat CBC and iron panel: improved      Relevant Orders   Iron, TIBC and Ferritin Panel (Completed)   CBC (Completed)   Spinal stenosis in cervical region   Relevant Orders   Ambulatory referral to [place]   Thoracic spinal stenosis   Relevant Orders   Ambulatory referral to [place]   Return in about 6 months (around 12/18/2022) for DM, HTN, hyperlipidemia (fasting).     Wilfred Lacy, NP

## 2022-06-19 NOTE — Patient Instructions (Addendum)
Go to lab Maintain low salt diet Use compression stocking during the day and off at night Schedule appt for diabetic eye exam

## 2022-06-19 NOTE — Assessment & Plan Note (Signed)
Completed home PT in 03/2023 Wants to resume PT to help maintain mobility, ROM, and strength

## 2022-06-20 ENCOUNTER — Encounter: Payer: Self-pay | Admitting: Nurse Practitioner

## 2022-06-20 LAB — IRON,TIBC AND FERRITIN PANEL
%SAT: 28 % (calc) (ref 20–48)
Ferritin: 20 ng/mL — ABNORMAL LOW (ref 24–380)
Iron: 83 ug/dL (ref 50–180)
TIBC: 296 mcg/dL (calc) (ref 250–425)

## 2022-06-20 MED ORDER — OLMESARTAN-AMLODIPINE-HCTZ 40-10-12.5 MG PO TABS: 1.0000 | ORAL_TABLET | Freq: Every day | ORAL | 1 refills | Status: DC

## 2022-06-20 MED ORDER — MIRABEGRON ER 25 MG PO TB24: 25.0000 mg | ORAL_TABLET | Freq: Every day | ORAL | 3 refills | Status: DC

## 2022-06-20 MED ORDER — ATORVASTATIN CALCIUM 20 MG PO TABS: 20.0000 mg | ORAL_TABLET | Freq: Every day | ORAL | 3 refills | Status: DC

## 2022-06-20 MED ORDER — PREGABALIN 100 MG PO CAPS: 100.0000 mg | ORAL_CAPSULE | Freq: Two times a day (BID) | ORAL | 3 refills | Status: DC

## 2022-06-20 NOTE — Assessment & Plan Note (Signed)
BP at goal LE edema due to diet non compliance, no ulcers, no cellulitis. BP Readings from Last 3 Encounters:  06/19/22 130/82  03/20/22 120/60  03/11/22 126/76    Maintain olmesartan, amlodipine and HCTZ dose Advised to maintain DASH diet and use of compression stocking Repeat BMP

## 2022-06-20 NOTE — Assessment & Plan Note (Signed)
Advised to schedule appt for annual DM eye exam. No adverse effects with metformin No glucose check at home Repeat hgbA1c, urine microalbumin, bmp and lipid panel

## 2022-06-20 NOTE — Assessment & Plan Note (Signed)
Upcoming appt for EGD Current use of PPI Repeat CBC and iron panel: improved

## 2022-06-20 NOTE — Assessment & Plan Note (Signed)
Repeat lipid panel Maintain atorvastatin dose

## 2022-06-23 ENCOUNTER — Encounter: Payer: Self-pay | Admitting: Internal Medicine

## 2022-06-24 ENCOUNTER — Telehealth: Payer: Self-pay | Admitting: Nurse Practitioner

## 2022-06-24 NOTE — Telephone Encounter (Signed)
[name] from [facility] [phone] called and stated that the received the referral and they going to start the home health on 01.29.24

## 2022-06-25 ENCOUNTER — Encounter (HOSPITAL_COMMUNITY): Payer: Self-pay | Admitting: Internal Medicine

## 2022-06-25 NOTE — Progress Notes (Signed)
Attempted to obtain medical history via telephone, unable to reach at this time. HIPAA compliant voicemail message left requesting return call to pre surgical testing department.  

## 2022-06-26 ENCOUNTER — Other Ambulatory Visit: Payer: Self-pay | Admitting: Nurse Practitioner

## 2022-06-26 NOTE — Telephone Encounter (Signed)
Chart supports Rx Last OV: 06/2022 Next OV: 11/2022

## 2022-06-29 DIAGNOSIS — Z86711 Personal history of pulmonary embolism: Secondary | ICD-10-CM | POA: Diagnosis not present

## 2022-06-29 DIAGNOSIS — G35 Multiple sclerosis: Secondary | ICD-10-CM | POA: Diagnosis not present

## 2022-06-29 DIAGNOSIS — M4316 Spondylolisthesis, lumbar region: Secondary | ICD-10-CM | POA: Diagnosis not present

## 2022-06-29 DIAGNOSIS — M4802 Spinal stenosis, cervical region: Secondary | ICD-10-CM | POA: Diagnosis not present

## 2022-06-29 DIAGNOSIS — I10 Essential (primary) hypertension: Secondary | ICD-10-CM | POA: Diagnosis not present

## 2022-06-29 DIAGNOSIS — E785 Hyperlipidemia, unspecified: Secondary | ICD-10-CM | POA: Diagnosis not present

## 2022-06-29 DIAGNOSIS — Z8616 Personal history of COVID-19: Secondary | ICD-10-CM | POA: Diagnosis not present

## 2022-06-29 DIAGNOSIS — R278 Other lack of coordination: Secondary | ICD-10-CM | POA: Diagnosis not present

## 2022-06-29 DIAGNOSIS — E1169 Type 2 diabetes mellitus with other specified complication: Secondary | ICD-10-CM | POA: Diagnosis not present

## 2022-06-29 DIAGNOSIS — Z7984 Long term (current) use of oral hypoglycemic drugs: Secondary | ICD-10-CM | POA: Diagnosis not present

## 2022-06-29 DIAGNOSIS — M4804 Spinal stenosis, thoracic region: Secondary | ICD-10-CM | POA: Diagnosis not present

## 2022-06-29 DIAGNOSIS — E1151 Type 2 diabetes mellitus with diabetic peripheral angiopathy without gangrene: Secondary | ICD-10-CM | POA: Diagnosis not present

## 2022-06-29 DIAGNOSIS — D509 Iron deficiency anemia, unspecified: Secondary | ICD-10-CM | POA: Diagnosis not present

## 2022-06-29 DIAGNOSIS — E1142 Type 2 diabetes mellitus with diabetic polyneuropathy: Secondary | ICD-10-CM | POA: Diagnosis not present

## 2022-06-29 DIAGNOSIS — R350 Frequency of micturition: Secondary | ICD-10-CM | POA: Diagnosis not present

## 2022-06-29 DIAGNOSIS — G8220 Paraplegia, unspecified: Secondary | ICD-10-CM | POA: Diagnosis not present

## 2022-06-29 DIAGNOSIS — E559 Vitamin D deficiency, unspecified: Secondary | ICD-10-CM | POA: Diagnosis not present

## 2022-06-30 ENCOUNTER — Telehealth: Payer: Self-pay

## 2022-06-30 NOTE — Telephone Encounter (Signed)
Pt cancelled appt scheduled at hospital on 2/1. Needs to reschedule procedure.  Magda Paganini can you help me get this pts procedure rescheduled please?

## 2022-07-01 ENCOUNTER — Telehealth: Payer: Self-pay | Admitting: Nurse Practitioner

## 2022-07-01 NOTE — Telephone Encounter (Signed)
Flor from [facility] [phone] . Called and stated that they Would like home health order for the pt and a referral occupational therapist:  order 1 time for 8 weeks

## 2022-07-02 ENCOUNTER — Encounter (HOSPITAL_COMMUNITY): Admission: RE | Payer: Self-pay | Source: Home / Self Care

## 2022-07-02 ENCOUNTER — Ambulatory Visit (HOSPITAL_COMMUNITY): Admission: RE | Admit: 2022-07-02 | Payer: Medicare Other | Source: Home / Self Care | Admitting: Internal Medicine

## 2022-07-02 SURGERY — ESOPHAGOGASTRODUODENOSCOPY (EGD) WITH PROPOFOL
Anesthesia: Monitor Anesthesia Care

## 2022-07-02 NOTE — Telephone Encounter (Signed)
Called & provided verbal orders on Secure VM

## 2022-07-07 NOTE — Telephone Encounter (Signed)
Larry [name] called back and said she hadnt heard anything, I asked about number below and she said it was wrong and it was supposed to be [phone]

## 2022-07-09 ENCOUNTER — Telehealth: Payer: Self-pay

## 2022-07-09 DIAGNOSIS — M4804 Spinal stenosis, thoracic region: Secondary | ICD-10-CM | POA: Diagnosis not present

## 2022-07-09 DIAGNOSIS — R278 Other lack of coordination: Secondary | ICD-10-CM | POA: Diagnosis not present

## 2022-07-09 DIAGNOSIS — Z7984 Long term (current) use of oral hypoglycemic drugs: Secondary | ICD-10-CM | POA: Diagnosis not present

## 2022-07-09 DIAGNOSIS — M4802 Spinal stenosis, cervical region: Secondary | ICD-10-CM | POA: Diagnosis not present

## 2022-07-09 DIAGNOSIS — E559 Vitamin D deficiency, unspecified: Secondary | ICD-10-CM | POA: Diagnosis not present

## 2022-07-09 DIAGNOSIS — Z8616 Personal history of COVID-19: Secondary | ICD-10-CM | POA: Diagnosis not present

## 2022-07-09 DIAGNOSIS — G35 Multiple sclerosis: Secondary | ICD-10-CM | POA: Diagnosis not present

## 2022-07-09 DIAGNOSIS — I10 Essential (primary) hypertension: Secondary | ICD-10-CM | POA: Diagnosis not present

## 2022-07-09 DIAGNOSIS — M4316 Spondylolisthesis, lumbar region: Secondary | ICD-10-CM | POA: Diagnosis not present

## 2022-07-09 DIAGNOSIS — R350 Frequency of micturition: Secondary | ICD-10-CM | POA: Diagnosis not present

## 2022-07-09 DIAGNOSIS — D509 Iron deficiency anemia, unspecified: Secondary | ICD-10-CM | POA: Diagnosis not present

## 2022-07-09 DIAGNOSIS — E1151 Type 2 diabetes mellitus with diabetic peripheral angiopathy without gangrene: Secondary | ICD-10-CM | POA: Diagnosis not present

## 2022-07-09 DIAGNOSIS — E1142 Type 2 diabetes mellitus with diabetic polyneuropathy: Secondary | ICD-10-CM | POA: Diagnosis not present

## 2022-07-09 DIAGNOSIS — E1169 Type 2 diabetes mellitus with other specified complication: Secondary | ICD-10-CM | POA: Diagnosis not present

## 2022-07-09 DIAGNOSIS — Z86711 Personal history of pulmonary embolism: Secondary | ICD-10-CM | POA: Diagnosis not present

## 2022-07-09 DIAGNOSIS — E785 Hyperlipidemia, unspecified: Secondary | ICD-10-CM | POA: Diagnosis not present

## 2022-07-09 DIAGNOSIS — G8220 Paraplegia, unspecified: Secondary | ICD-10-CM | POA: Diagnosis not present

## 2022-07-09 NOTE — Progress Notes (Signed)
Care Management & Coordination Services Pharmacy Team  Reason for Encounter: Hypertension  Contacted patient to discuss hypertension disease state.  Unsuccessful outreach. Left voicemail for patient to return call.    Current antihypertensive regimen:  Olmesartan-Amlodipine-HCTZ 40-10-12.5 mg daily    What recent interventions/DTPs have been made by any provider to improve Blood Pressure control since last CPP Visit: None ID  Any recent hospitalizations or ED visits since last visit with CPP? No   Adherence Review: Is the patient currently on ACE/ARB medication? Yes Does the patient have >5 day gap between last estimated fill dates? No  Star Rating Drugs:  Medication: Atorvastatin 20 mg Last Fill: 06/15/2022 90 Day Supply at [facility]. Medication: Metformin 750 mg Last Fill: 05/02/2022 90 Day Supply at [facility]. Medication: Olmesartan-Amlodipine-HCTZ 40-10-12.5 mg daily  Last Fill: 04/21/2022 90 Day Supply at [facility].  Chart Updates: Recent office visits:  06/19/2022 Wilfred Lacy NP (PCP) No medication changes noted, Ambulatory referral to [facility] , Return in about 6 months   Recent consult visits:  None ID  Hospital visits:  None in previous 6 months  Medications: Outpatient Encounter Medications as of 07/09/2022  Medication Sig Note   ACCU-CHEK AVIVA PLUS test strip TEST FASTING BLOOD SUGAR  ONCE DAILY IN THE MORNING    Accu-Chek FastClix Lancets MISC     atorvastatin (LIPITOR) 20 MG tablet Take 1 tablet (20 mg total) by mouth daily.    baclofen (LIORESAL) 20 MG tablet Take 1 tablet (20 mg total) by mouth 3 (three) times daily. 03/18/2022: Patient takes it two times a day   Blood Glucose Calibration (ACCU-CHEK AVIVA) SOLN     ferrous sulfate 325 (65 FE) MG tablet Take 1 tablet (325 mg total) by mouth daily with breakfast.    finasteride (PROSCAR) 5 MG tablet Take 1 tablet (5 mg total) by mouth daily.    Incontinence Supply Disposable (DEPEND  ADJUSTABLE UNDERWEAR LG) MISC Use as needed.    Lancets Misc. (ACCU-CHEK FASTCLIX LANCET) KIT 1 Units by Does not apply route daily before breakfast.    metFORMIN (GLUCOPHAGE-XR) 750 MG 24 hr tablet TAKE 1 TABLET BY MOUTH DAILY  WITH BREAKFAST    mirabegron ER (MYRBETRIQ) 25 MG TB24 tablet Take 1 tablet (25 mg total) by mouth daily.    Multiple Vitamin (MULTIVITAMIN) tablet Take 1 tablet by mouth daily.    Olmesartan-amLODIPine-HCTZ 40-10-12.5 MG TABS Take 1 tablet by mouth daily.    pantoprazole (PROTONIX) 40 MG tablet TAKE 1 TABLET BY MOUTH TWICE  DAILY BEFORE MEALS    pregabalin (LYRICA) 100 MG capsule Take 1 capsule (100 mg total) by mouth 2 (two) times daily.    traZODone (DESYREL) 50 MG tablet TAKE 1 TABLET BY MOUTH AT  BEDTIME    No facility-administered encounter medications on file as of 07/09/2022.    Recent Office Vitals: BP Readings from Last 3 Encounters:  06/19/22 130/82  03/20/22 120/60  03/11/22 126/76   Pulse Readings from Last 3 Encounters:  06/19/22 75  03/20/22 84  03/11/22 64    Wt Readings from Last 3 Encounters:  06/19/22 166 lb 3.2 oz (75.4 kg)  06/12/22 165 lb (74.8 kg)  03/20/22 155 lb (70.3 kg)     Kidney Function Lab Results  Component Value Date/Time   CREATININE 1.07 06/19/2022 02:12 PM   CREATININE 1.38 03/20/2022 11:19 AM   CREATININE 1.0 01/04/2014 09:43 AM   CREATININE 0.9 05/22/2013 11:15 AM   GFR 68.67 06/19/2022 02:12 PM  GFRNONAA >60 02/18/2022 06:32 AM   GFRNONAA >89 12/25/2011 11:32 AM   GFRAA >60 09/30/2019 02:24 AM   GFRAA >89 12/25/2011 11:32 AM       Latest Ref Rng & Units 06/19/2022    2:12 PM 03/20/2022   11:19 AM 02/18/2022    6:32 AM  BMP  Glucose 70 - 99 mg/dL 159  185  136   BUN 6 - 23 mg/dL 21  40  13   Creatinine 0.40 - 1.50 mg/dL 1.07  1.38  1.02   Sodium 135 - 145 mEq/L 145  140  138   Potassium 3.5 - 5.1 mEq/L 4.1  3.9  3.8   Chloride 96 - 112 mEq/L 104  104  104   CO2 19 - 32 mEq/L '30  25  21   '$ Calcium 8.4 -  10.5 mg/dL 9.7  9.5  9.2      [facility] Clinical Pharmacist Assistant [phone]

## 2022-07-10 ENCOUNTER — Telehealth: Payer: Self-pay | Admitting: Nurse Practitioner

## 2022-07-10 DIAGNOSIS — G35 Multiple sclerosis: Secondary | ICD-10-CM | POA: Diagnosis not present

## 2022-07-10 DIAGNOSIS — M4316 Spondylolisthesis, lumbar region: Secondary | ICD-10-CM | POA: Diagnosis not present

## 2022-07-10 DIAGNOSIS — E1169 Type 2 diabetes mellitus with other specified complication: Secondary | ICD-10-CM | POA: Diagnosis not present

## 2022-07-10 DIAGNOSIS — Z8616 Personal history of COVID-19: Secondary | ICD-10-CM | POA: Diagnosis not present

## 2022-07-10 DIAGNOSIS — M4802 Spinal stenosis, cervical region: Secondary | ICD-10-CM | POA: Diagnosis not present

## 2022-07-10 DIAGNOSIS — E559 Vitamin D deficiency, unspecified: Secondary | ICD-10-CM | POA: Diagnosis not present

## 2022-07-10 DIAGNOSIS — N401 Enlarged prostate with lower urinary tract symptoms: Secondary | ICD-10-CM

## 2022-07-10 DIAGNOSIS — E785 Hyperlipidemia, unspecified: Secondary | ICD-10-CM | POA: Diagnosis not present

## 2022-07-10 DIAGNOSIS — E1142 Type 2 diabetes mellitus with diabetic polyneuropathy: Secondary | ICD-10-CM | POA: Diagnosis not present

## 2022-07-10 DIAGNOSIS — I10 Essential (primary) hypertension: Secondary | ICD-10-CM | POA: Diagnosis not present

## 2022-07-10 DIAGNOSIS — Z86711 Personal history of pulmonary embolism: Secondary | ICD-10-CM | POA: Diagnosis not present

## 2022-07-10 DIAGNOSIS — Z7984 Long term (current) use of oral hypoglycemic drugs: Secondary | ICD-10-CM | POA: Diagnosis not present

## 2022-07-10 DIAGNOSIS — R350 Frequency of micturition: Secondary | ICD-10-CM | POA: Diagnosis not present

## 2022-07-10 DIAGNOSIS — M4804 Spinal stenosis, thoracic region: Secondary | ICD-10-CM | POA: Diagnosis not present

## 2022-07-10 DIAGNOSIS — E1151 Type 2 diabetes mellitus with diabetic peripheral angiopathy without gangrene: Secondary | ICD-10-CM | POA: Diagnosis not present

## 2022-07-10 DIAGNOSIS — G8220 Paraplegia, unspecified: Secondary | ICD-10-CM | POA: Diagnosis not present

## 2022-07-10 DIAGNOSIS — D509 Iron deficiency anemia, unspecified: Secondary | ICD-10-CM | POA: Diagnosis not present

## 2022-07-10 DIAGNOSIS — R278 Other lack of coordination: Secondary | ICD-10-CM | POA: Diagnosis not present

## 2022-07-10 NOTE — Telephone Encounter (Signed)
Shirlee Limerick OT from [place]  [phone]  Needs verbal orders for OT [id number]

## 2022-07-10 NOTE — Telephone Encounter (Signed)
Left message to return call.  

## 2022-07-13 ENCOUNTER — Ambulatory Visit: Payer: Medicare Other | Admitting: Nurse Practitioner

## 2022-07-13 ENCOUNTER — Telehealth: Payer: Self-pay

## 2022-07-13 NOTE — Telephone Encounter (Signed)
Left a second message to return call

## 2022-07-13 NOTE — Telephone Encounter (Signed)
CLINICAL USE BELOW THIS LINE (use X to signify action taken)  _X__ Form received and placed in providers office for signature. _X__ Form completed and faxed to [facility] ___ Form completed & LVM to notify patient ready for pick up.  ___ Charge sheet and copy of form in front office folder for office supervisor.

## 2022-07-15 DIAGNOSIS — M4802 Spinal stenosis, cervical region: Secondary | ICD-10-CM | POA: Diagnosis not present

## 2022-07-15 DIAGNOSIS — R350 Frequency of micturition: Secondary | ICD-10-CM | POA: Diagnosis not present

## 2022-07-15 DIAGNOSIS — R278 Other lack of coordination: Secondary | ICD-10-CM | POA: Diagnosis not present

## 2022-07-15 DIAGNOSIS — G8220 Paraplegia, unspecified: Secondary | ICD-10-CM | POA: Diagnosis not present

## 2022-07-15 DIAGNOSIS — E1142 Type 2 diabetes mellitus with diabetic polyneuropathy: Secondary | ICD-10-CM | POA: Diagnosis not present

## 2022-07-15 DIAGNOSIS — Z8616 Personal history of COVID-19: Secondary | ICD-10-CM | POA: Diagnosis not present

## 2022-07-15 DIAGNOSIS — E1169 Type 2 diabetes mellitus with other specified complication: Secondary | ICD-10-CM | POA: Diagnosis not present

## 2022-07-15 DIAGNOSIS — E559 Vitamin D deficiency, unspecified: Secondary | ICD-10-CM | POA: Diagnosis not present

## 2022-07-15 DIAGNOSIS — Z7984 Long term (current) use of oral hypoglycemic drugs: Secondary | ICD-10-CM | POA: Diagnosis not present

## 2022-07-15 DIAGNOSIS — E1151 Type 2 diabetes mellitus with diabetic peripheral angiopathy without gangrene: Secondary | ICD-10-CM | POA: Diagnosis not present

## 2022-07-15 DIAGNOSIS — I10 Essential (primary) hypertension: Secondary | ICD-10-CM | POA: Diagnosis not present

## 2022-07-15 DIAGNOSIS — G35 Multiple sclerosis: Secondary | ICD-10-CM | POA: Diagnosis not present

## 2022-07-15 DIAGNOSIS — M4804 Spinal stenosis, thoracic region: Secondary | ICD-10-CM | POA: Diagnosis not present

## 2022-07-15 DIAGNOSIS — M4316 Spondylolisthesis, lumbar region: Secondary | ICD-10-CM | POA: Diagnosis not present

## 2022-07-15 DIAGNOSIS — Z86711 Personal history of pulmonary embolism: Secondary | ICD-10-CM | POA: Diagnosis not present

## 2022-07-15 DIAGNOSIS — D509 Iron deficiency anemia, unspecified: Secondary | ICD-10-CM | POA: Diagnosis not present

## 2022-07-15 DIAGNOSIS — E785 Hyperlipidemia, unspecified: Secondary | ICD-10-CM | POA: Diagnosis not present

## 2022-07-17 DIAGNOSIS — G35 Multiple sclerosis: Secondary | ICD-10-CM | POA: Diagnosis not present

## 2022-07-17 DIAGNOSIS — E1142 Type 2 diabetes mellitus with diabetic polyneuropathy: Secondary | ICD-10-CM | POA: Diagnosis not present

## 2022-07-17 DIAGNOSIS — E1151 Type 2 diabetes mellitus with diabetic peripheral angiopathy without gangrene: Secondary | ICD-10-CM | POA: Diagnosis not present

## 2022-07-17 DIAGNOSIS — E785 Hyperlipidemia, unspecified: Secondary | ICD-10-CM | POA: Diagnosis not present

## 2022-07-17 DIAGNOSIS — R350 Frequency of micturition: Secondary | ICD-10-CM | POA: Diagnosis not present

## 2022-07-17 DIAGNOSIS — R278 Other lack of coordination: Secondary | ICD-10-CM | POA: Diagnosis not present

## 2022-07-17 DIAGNOSIS — I10 Essential (primary) hypertension: Secondary | ICD-10-CM | POA: Diagnosis not present

## 2022-07-17 DIAGNOSIS — Z7984 Long term (current) use of oral hypoglycemic drugs: Secondary | ICD-10-CM | POA: Diagnosis not present

## 2022-07-17 DIAGNOSIS — Z86711 Personal history of pulmonary embolism: Secondary | ICD-10-CM | POA: Diagnosis not present

## 2022-07-17 DIAGNOSIS — M4802 Spinal stenosis, cervical region: Secondary | ICD-10-CM | POA: Diagnosis not present

## 2022-07-17 DIAGNOSIS — E559 Vitamin D deficiency, unspecified: Secondary | ICD-10-CM | POA: Diagnosis not present

## 2022-07-17 DIAGNOSIS — Z8616 Personal history of COVID-19: Secondary | ICD-10-CM | POA: Diagnosis not present

## 2022-07-17 DIAGNOSIS — E1169 Type 2 diabetes mellitus with other specified complication: Secondary | ICD-10-CM | POA: Diagnosis not present

## 2022-07-17 DIAGNOSIS — G8220 Paraplegia, unspecified: Secondary | ICD-10-CM | POA: Diagnosis not present

## 2022-07-17 DIAGNOSIS — D509 Iron deficiency anemia, unspecified: Secondary | ICD-10-CM | POA: Diagnosis not present

## 2022-07-17 DIAGNOSIS — M4316 Spondylolisthesis, lumbar region: Secondary | ICD-10-CM | POA: Diagnosis not present

## 2022-07-17 DIAGNOSIS — M4804 Spinal stenosis, thoracic region: Secondary | ICD-10-CM | POA: Diagnosis not present

## 2022-07-22 DIAGNOSIS — Z7984 Long term (current) use of oral hypoglycemic drugs: Secondary | ICD-10-CM | POA: Diagnosis not present

## 2022-07-22 DIAGNOSIS — R350 Frequency of micturition: Secondary | ICD-10-CM | POA: Diagnosis not present

## 2022-07-22 DIAGNOSIS — E559 Vitamin D deficiency, unspecified: Secondary | ICD-10-CM | POA: Diagnosis not present

## 2022-07-22 DIAGNOSIS — E1169 Type 2 diabetes mellitus with other specified complication: Secondary | ICD-10-CM | POA: Diagnosis not present

## 2022-07-22 DIAGNOSIS — M4802 Spinal stenosis, cervical region: Secondary | ICD-10-CM | POA: Diagnosis not present

## 2022-07-22 DIAGNOSIS — D509 Iron deficiency anemia, unspecified: Secondary | ICD-10-CM | POA: Diagnosis not present

## 2022-07-22 DIAGNOSIS — G8220 Paraplegia, unspecified: Secondary | ICD-10-CM | POA: Diagnosis not present

## 2022-07-22 DIAGNOSIS — E1151 Type 2 diabetes mellitus with diabetic peripheral angiopathy without gangrene: Secondary | ICD-10-CM | POA: Diagnosis not present

## 2022-07-22 DIAGNOSIS — M4804 Spinal stenosis, thoracic region: Secondary | ICD-10-CM | POA: Diagnosis not present

## 2022-07-22 DIAGNOSIS — G35 Multiple sclerosis: Secondary | ICD-10-CM | POA: Diagnosis not present

## 2022-07-22 DIAGNOSIS — E785 Hyperlipidemia, unspecified: Secondary | ICD-10-CM | POA: Diagnosis not present

## 2022-07-22 DIAGNOSIS — Z8616 Personal history of COVID-19: Secondary | ICD-10-CM | POA: Diagnosis not present

## 2022-07-22 DIAGNOSIS — R278 Other lack of coordination: Secondary | ICD-10-CM | POA: Diagnosis not present

## 2022-07-22 DIAGNOSIS — M4316 Spondylolisthesis, lumbar region: Secondary | ICD-10-CM | POA: Diagnosis not present

## 2022-07-22 DIAGNOSIS — E1142 Type 2 diabetes mellitus with diabetic polyneuropathy: Secondary | ICD-10-CM | POA: Diagnosis not present

## 2022-07-22 DIAGNOSIS — I10 Essential (primary) hypertension: Secondary | ICD-10-CM | POA: Diagnosis not present

## 2022-07-22 DIAGNOSIS — Z86711 Personal history of pulmonary embolism: Secondary | ICD-10-CM | POA: Diagnosis not present

## 2022-07-28 DIAGNOSIS — G8220 Paraplegia, unspecified: Secondary | ICD-10-CM | POA: Diagnosis not present

## 2022-07-28 DIAGNOSIS — Z7984 Long term (current) use of oral hypoglycemic drugs: Secondary | ICD-10-CM | POA: Diagnosis not present

## 2022-07-28 DIAGNOSIS — R350 Frequency of micturition: Secondary | ICD-10-CM | POA: Diagnosis not present

## 2022-07-28 DIAGNOSIS — I10 Essential (primary) hypertension: Secondary | ICD-10-CM | POA: Diagnosis not present

## 2022-07-28 DIAGNOSIS — M4316 Spondylolisthesis, lumbar region: Secondary | ICD-10-CM | POA: Diagnosis not present

## 2022-07-28 DIAGNOSIS — E785 Hyperlipidemia, unspecified: Secondary | ICD-10-CM | POA: Diagnosis not present

## 2022-07-28 DIAGNOSIS — E559 Vitamin D deficiency, unspecified: Secondary | ICD-10-CM | POA: Diagnosis not present

## 2022-07-28 DIAGNOSIS — R278 Other lack of coordination: Secondary | ICD-10-CM | POA: Diagnosis not present

## 2022-07-28 DIAGNOSIS — E1169 Type 2 diabetes mellitus with other specified complication: Secondary | ICD-10-CM | POA: Diagnosis not present

## 2022-07-28 DIAGNOSIS — M4802 Spinal stenosis, cervical region: Secondary | ICD-10-CM | POA: Diagnosis not present

## 2022-07-28 DIAGNOSIS — G35 Multiple sclerosis: Secondary | ICD-10-CM | POA: Diagnosis not present

## 2022-07-28 DIAGNOSIS — E1151 Type 2 diabetes mellitus with diabetic peripheral angiopathy without gangrene: Secondary | ICD-10-CM | POA: Diagnosis not present

## 2022-07-28 DIAGNOSIS — D509 Iron deficiency anemia, unspecified: Secondary | ICD-10-CM | POA: Diagnosis not present

## 2022-07-28 DIAGNOSIS — M4804 Spinal stenosis, thoracic region: Secondary | ICD-10-CM | POA: Diagnosis not present

## 2022-07-28 DIAGNOSIS — Z86711 Personal history of pulmonary embolism: Secondary | ICD-10-CM | POA: Diagnosis not present

## 2022-07-28 DIAGNOSIS — Z8616 Personal history of COVID-19: Secondary | ICD-10-CM | POA: Diagnosis not present

## 2022-07-28 DIAGNOSIS — E1142 Type 2 diabetes mellitus with diabetic polyneuropathy: Secondary | ICD-10-CM | POA: Diagnosis not present

## 2022-07-31 ENCOUNTER — Other Ambulatory Visit: Payer: Self-pay | Admitting: Nurse Practitioner

## 2022-07-31 DIAGNOSIS — N401 Enlarged prostate with lower urinary tract symptoms: Secondary | ICD-10-CM

## 2022-07-31 NOTE — Telephone Encounter (Signed)
Lm on vm to reschedule hospital case

## 2022-08-03 ENCOUNTER — Other Ambulatory Visit: Payer: Self-pay

## 2022-08-03 DIAGNOSIS — N401 Enlarged prostate with lower urinary tract symptoms: Secondary | ICD-10-CM

## 2022-08-03 MED ORDER — FINASTERIDE 5 MG PO TABS: 5.0000 mg | ORAL_TABLET | Freq: Every day | ORAL | 3 refills | Status: DC

## 2022-08-27 DIAGNOSIS — E1151 Type 2 diabetes mellitus with diabetic peripheral angiopathy without gangrene: Secondary | ICD-10-CM | POA: Diagnosis not present

## 2022-08-27 DIAGNOSIS — Z86711 Personal history of pulmonary embolism: Secondary | ICD-10-CM | POA: Diagnosis not present

## 2022-08-27 DIAGNOSIS — E1142 Type 2 diabetes mellitus with diabetic polyneuropathy: Secondary | ICD-10-CM | POA: Diagnosis not present

## 2022-08-27 DIAGNOSIS — R350 Frequency of micturition: Secondary | ICD-10-CM | POA: Diagnosis not present

## 2022-08-27 DIAGNOSIS — M4804 Spinal stenosis, thoracic region: Secondary | ICD-10-CM | POA: Diagnosis not present

## 2022-08-27 DIAGNOSIS — E559 Vitamin D deficiency, unspecified: Secondary | ICD-10-CM | POA: Diagnosis not present

## 2022-08-27 DIAGNOSIS — R278 Other lack of coordination: Secondary | ICD-10-CM | POA: Diagnosis not present

## 2022-08-27 DIAGNOSIS — I10 Essential (primary) hypertension: Secondary | ICD-10-CM | POA: Diagnosis not present

## 2022-08-27 DIAGNOSIS — G8220 Paraplegia, unspecified: Secondary | ICD-10-CM | POA: Diagnosis not present

## 2022-08-27 DIAGNOSIS — M4802 Spinal stenosis, cervical region: Secondary | ICD-10-CM | POA: Diagnosis not present

## 2022-08-27 DIAGNOSIS — M4316 Spondylolisthesis, lumbar region: Secondary | ICD-10-CM | POA: Diagnosis not present

## 2022-08-27 DIAGNOSIS — D509 Iron deficiency anemia, unspecified: Secondary | ICD-10-CM | POA: Diagnosis not present

## 2022-08-27 DIAGNOSIS — E785 Hyperlipidemia, unspecified: Secondary | ICD-10-CM | POA: Diagnosis not present

## 2022-08-27 DIAGNOSIS — Z8616 Personal history of COVID-19: Secondary | ICD-10-CM | POA: Diagnosis not present

## 2022-08-27 DIAGNOSIS — E1169 Type 2 diabetes mellitus with other specified complication: Secondary | ICD-10-CM | POA: Diagnosis not present

## 2022-08-27 DIAGNOSIS — G35 Multiple sclerosis: Secondary | ICD-10-CM | POA: Diagnosis not present

## 2022-08-27 DIAGNOSIS — Z7984 Long term (current) use of oral hypoglycemic drugs: Secondary | ICD-10-CM | POA: Diagnosis not present

## 2022-09-20 ENCOUNTER — Other Ambulatory Visit: Payer: Self-pay | Admitting: Nurse Practitioner

## 2022-09-20 DIAGNOSIS — I10 Essential (primary) hypertension: Secondary | ICD-10-CM

## 2022-10-01 ENCOUNTER — Ambulatory Visit: Payer: Medicare Other

## 2022-10-01 DIAGNOSIS — R600 Localized edema: Secondary | ICD-10-CM

## 2022-10-01 DIAGNOSIS — I10 Essential (primary) hypertension: Secondary | ICD-10-CM

## 2022-10-01 NOTE — Progress Notes (Signed)
Care Management & Coordination Services Pharmacy Note  10/01/2022 Name:  Larry [name] MRN:  [id number] DOB:  Feb 21, 1951  Summary: Patient presents for follow-up consult.   Patient continues to struggle with swelling in both legs. Unable to use compression stockings.   Recommendations/Changes made from today's visit: Continue current medications  Follow up plan: CPP follow-up 6 months    Subjective: Larry [name] is an 72 y.o. year old male who is a primary patient of Nche, Bonna Gains, NP.  The care coordination team was consulted for assistance with disease management and care coordination needs.    Engaged with patient by telephone for follow up visit.  Recent office visits: 06/19/22: Patient presented to Alysia Penna, NP 03/20/22: Patient presented to Alysia Penna, NP for hospital follow-up.  01/12/2022 Alysia Penna NP (PCP) No Medication Changes noted,Ambulatory referral to Home Health   Recent consult visits: None ID   Hospital visits: Admitted to the hospital on 02/15/2022 due to Gastrointestinal hemorrhage. Discharge date was 02/18/2022. Discharged from [facility].   Objective:  Lab Results  Component Value Date   CREATININE 1.07 06/19/2022   BUN 21 06/19/2022   GFR 68.67 06/19/2022   GFRNONAA >60 02/18/2022   GFRAA >60 09/30/2019   NA 145 06/19/2022   K 4.1 06/19/2022   CALCIUM 9.7 06/19/2022   CO2 30 06/19/2022   GLUCOSE 159 (H) 06/19/2022    Lab Results  Component Value Date/Time   HGBA1C 7.3 (H) 06/19/2022 02:12 PM   HGBA1C 6.6 (H) 01/12/2022 02:42 PM   GFR 68.67 06/19/2022 02:12 PM   GFR 50.69 (L) 03/20/2022 11:19 AM   MICROALBUR 4.7 (H) 07/27/2016 12:57 PM    Last diabetic Eye exam:  Lab Results  Component Value Date/Time   HMDIABEYEEXA No Retinopathy 12/11/2019 12:00 AM    Last diabetic Foot exam: No results found for: "HMDIABFOOTEX"   Lab Results  Component Value Date   CHOL 116 01/12/2022   HDL 34.90 (L)  01/12/2022   LDLCALC 58 01/12/2022   LDLDIRECT 51.0 06/19/2022   TRIG 116.0 01/12/2022   CHOLHDL 3 01/12/2022       Latest Ref Rng & Units 06/19/2022    2:12 PM 03/20/2022   11:19 AM 02/15/2022   10:52 AM  Hepatic Function  Total Protein 6.5 - 8.1 g/dL   5.8   Albumin 3.5 - 5.2 g/dL 4.6  4.1  3.6   AST 15 - 41 U/L   26   ALT 0 - 44 U/L   18   Alk Phosphatase 38 - 126 U/L   55   Total Bilirubin 0.3 - 1.2 mg/dL   0.3     Lab Results  Component Value Date/Time   TSH 1.15 07/31/2020 12:21 PM   TSH 1.46 03/24/2019 03:55 PM       Latest Ref Rng & Units 06/19/2022    2:12 PM 03/20/2022   11:19 AM 02/18/2022    6:32 AM  CBC  WBC 4.0 - 10.5 K/uL 4.3  6.3  6.7   Hemoglobin 13.0 - 17.0 g/dL 40.9  81.1  9.9   Hematocrit 39.0 - 52.0 % 37.5  32.1  28.9   Platelets 150.0 - 400.0 K/uL 359.0  489.0  308     Lab Results  Component Value Date/Time   VITAMINB12 375 06/23/2018 08:58 AM   VITAMINB12 748 01/01/2016 11:39 AM    Clinical ASCVD: No  The ASCVD Risk score (Arnett DK, et al., 2019) failed to calculate for the  following reasons:   The valid total cholesterol range is 130 to 320 mg/dL   Unable to determine if patient is Non-Hispanic African American       06/12/2022   10:45 AM 04/30/2021   11:27 AM 04/30/2021   11:23 AM  Depression screen PHQ 2/9  Decreased Interest 0 0 0  Down, Depressed, Hopeless 0 0 0  PHQ - 2 Score 0 0 0     Social History   Tobacco Use  Smoking Status Never  Smokeless Tobacco Never   BP Readings from Last 3 Encounters:  06/19/22 130/82  03/20/22 120/60  03/11/22 126/76   Pulse Readings from Last 3 Encounters:  06/19/22 75  03/20/22 84  03/11/22 64   Wt Readings from Last 3 Encounters:  06/19/22 166 lb 3.2 oz (75.4 kg)  06/12/22 165 lb (74.8 kg)  03/20/22 155 lb (70.3 kg)   BMI Readings from Last 3 Encounters:  06/19/22 26.03 kg/m  06/12/22 25.84 kg/m  03/20/22 24.28 kg/m    Allergies  Allergen Reactions   Nsaids Other  (See Comments)    Hx of GI bleed   Rosuvastatin Other (See Comments)    Leg swelling    Medications Reviewed Today     Reviewed by Halford Decamp, CPhT (Pharmacy Technician) on 06/30/22 at [phone]  Med List Status: Per Pt - Rescheduling   Medication Order Taking? Sig Documenting Provider Last Dose Status Informant  ACCU-CHEK AVIVA PLUS test strip [id number]  TEST FASTING BLOOD SUGAR  ONCE DAILY IN THE MORNING Nche, Bonna Gains, NP  Active Self  Accu-Chek FastClix Lancets MISC [id number]   [provider]  Active Self  atorvastatin (LIPITOR) 20 MG tablet [id number]  Take 1 tablet (20 mg total) by mouth daily. Nche, Bonna Gains, NP  Active   baclofen (LIORESAL) 20 MG tablet [id number]  Take 1 tablet (20 mg total) by mouth 3 (three) times daily. Hughie Closs, MD  Active            Med Note Bing Ree, Deland Pretty Mar 18, 2022  2:39 PM) Patient takes it two times a day  Blood Glucose Calibration (ACCU-CHEK AVIVA) SOLN [id number]   [provider]  Active Self  ferrous sulfate 325 (65 FE) MG tablet [id number]  Take 1 tablet (325 mg total) by mouth daily with breakfast. Nche, Bonna Gains, NP  Active Self  finasteride (PROSCAR) 5 MG tablet [id number]  Take 1 tablet (5 mg total) by mouth daily. Nche, Bonna Gains, NP  Active Self, Pharmacy Records  Incontinence Supply Disposable (DEPEND ADJUSTABLE UNDERWEAR LG) MISC [id number]  Use as needed. Levert Feinstein, MD  Active Self  Lancets Misc. (ACCU-CHEK FASTCLIX LANCET) KIT [id number]  1 Units by Does not apply route daily before breakfast. Nche, Bonna Gains, NP  Active Self  metFORMIN (GLUCOPHAGE-XR) 750 MG 24 hr tablet [id number]  TAKE 1 TABLET BY MOUTH DAILY  WITH BREAKFAST Nche, Bonna Gains, NP  Active   mirabegron ER (MYRBETRIQ) 25 MG TB24 tablet [id number]  Take 1 tablet (25 mg total) by mouth daily. Anne Ng, NP  Active   Multiple Vitamin (MULTIVITAMIN) tablet [id number]  Take 1 tablet by mouth daily. [provider]   Active Self  Olmesartan-amLODIPine-HCTZ 40-10-12.5 MG TABS [id number]  Take 1 tablet by mouth daily. Anne Ng, NP  Active   pantoprazole (PROTONIX) 40 MG tablet [id number]  TAKE 1 TABLET BY MOUTH TWICE  DAILY BEFORE MEALS Nche, Bonna Gains, NP  Active  pregabalin (LYRICA) 100 MG capsule [id number]  Take 1 capsule (100 mg total) by mouth 2 (two) times daily. Anne Ng, NP  Active   traZODone (DESYREL) 50 MG tablet [id number]  TAKE 1 TABLET BY MOUTH AT  BEDTIME Nche, Bonna Gains, NP  Active             SDOH:  (Social Determinants of Health) assessments and interventions performed: Yes SDOH Interventions    Flowsheet Row Clinical Support from 06/12/2022 in [facility] Coordination from 03/18/2022 in [facility] Coordination ED to Hosp-Admission (Discharged) from 02/15/2022 in [facility] Chronic Care Management from 07/03/2021 in [facility] [place] [facility] Clinical Support from 04/30/2021 in [facility] [place] [facility] at [facility] Chronic Care Management from 01/13/2021 in [facility] [employer]  SDOH Interventions        Food Insecurity Interventions Intervention Not Indicated Intervention Not Indicated -- -- Intervention Not Indicated --  Housing Interventions -- -- Intervention Not Indicated -- Intervention Not Indicated --  Transportation Interventions Intervention Not Indicated Intervention Not Indicated -- -- Intervention Not Indicated --  Financial Strain Interventions Intervention Not Indicated -- -- Intervention Not Indicated -- Intervention Not Indicated  Physical Activity Interventions Patient Refused, Other (Comments) -- -- -- Intervention Not Indicated --  Stress Interventions Intervention Not Indicated -- -- -- Intervention Not Indicated --  Social Connections Interventions -- -- -- --  Intervention Not Indicated --      SDOH Screenings   Food Insecurity: No Food Insecurity (06/12/2022)  Housing: Low Risk  (02/15/2022)  Transportation Needs: No Transportation Needs (06/12/2022)  Utilities: Not At Risk (02/15/2022)  Alcohol Screen: Low Risk  (04/30/2021)  Depression (PHQ2-9): Low Risk  (06/12/2022)  Financial Resource Strain: Low Risk  (06/12/2022)  Physical Activity: Inactive (06/12/2022)  Social Connections: Moderately Integrated (04/30/2021)  Stress: No Stress Concern Present (06/12/2022)  Tobacco Use: Low Risk  (06/25/2022)    Medication Assistance: None required.  Patient affirms current coverage meets needs.  Medication Access: Within the past 30 days, how often has patient missed a dose of medication? None Is a pillbox or other method used to improve adherence? Yes  Factors that may affect medication adherence? no barriers identified Are meds synced by current pharmacy? No  Are meds delivered by current pharmacy? Yes  Does patient experience delays in picking up medications due to transportation concerns? No   [facility] Services Reviewed: Is patient disadvantaged to use [facility]?: Yes  Current Rx insurance plan: [facility] Name and location of Current pharmacy:  [facility]) - [place], [place] - [address] [facility] [address] [place] [address] [place] [address] Phone: [phone] Fax: [phone]  [facility] - [place], [place] - [address] [place] [address] Phone: [phone] Fax: [phone]  [facility] [address] [place]), [place] - [address] [place] [address] Phone: [phone] Fax: [phone]  [facility] services reviewed with patient today?: Yes  Patient requests to transfer care to [facility] Pharm[facility]?: No  Reason patient declined to change pharmacies: Disadvantaged due to insurance/mail  order  Compliance/Adherence/Medication fill history: Care Gaps: COVID-19 Vaccine Ophthalmology Exam Influenza Vaccine  Star-Rating Drugs: Atorvastatin 20 mg last filled 12/27/2021 90 day supply at [facility].  Metformin 750 mg last filled 02/28/2022 90 day supply  at [facility].    Assessment/Plan  Hypertension (BP goal <140/90) -Controlled -Current treatment: Olmesartan-Amlodipine-HCTZ 40-10-12.5 mg daily: Appropriate, Effective, Safe, Accessible -Medications previously tried: NA  -Current home readings: 130/70 systolic   -Current dietary habits: Limits salt intake. -Current exercise habits: Stretching, light exercise every 2-3 days  -Denies hypotensive/hypertensive symptoms -Recommended to continue current medication -Monitor blood pressure twice weekly and record.   Hyperlipidemia: (LDL goal < 100) -Controlled -Current treatment: Atorvastatin 20 mg daily -Medications previously tried: NA  -Educated on Benefits of statin for ASCVD risk reduction; -Recommended to continue current medication  Diabetes (A1c goal <7%) -Controlled -Current medications: Metformin XR 750 mg daily: Appropriate, Effective, Safe, Accessible  -Medications previously tried: NA  -Current home glucose readings fasting glucose: 113 -Denies hypoglycemic/hyperglycemic symptoms -Educated on A1c and blood sugar goals; -Counseled to check feet daily and get yearly eye exams -Recommended to continue current medication  History of gastric ulcer with GI Bleed (Goal: Prevent recurrence) -Controlled -Current treatment  Pantoprazole 40 mg twice daily  -Medications previously tried: NA -Continue current medications   Angelena Sole, PharmD, [place]y [place], CPP Clinical Pharmacist Practitioner  [place] [facility]  [phone]

## 2022-11-12 ENCOUNTER — Other Ambulatory Visit: Payer: Self-pay | Admitting: Nurse Practitioner

## 2022-11-12 DIAGNOSIS — E118 Type 2 diabetes mellitus with unspecified complications: Secondary | ICD-10-CM

## 2022-11-27 ENCOUNTER — Other Ambulatory Visit: Payer: Self-pay | Admitting: Nurse Practitioner

## 2022-11-27 ENCOUNTER — Telehealth: Payer: Self-pay

## 2022-11-27 DIAGNOSIS — G8220 Paraplegia, unspecified: Secondary | ICD-10-CM

## 2022-11-27 DIAGNOSIS — M4802 Spinal stenosis, cervical region: Secondary | ICD-10-CM

## 2022-11-27 DIAGNOSIS — M48062 Spinal stenosis, lumbar region with neurogenic claudication: Secondary | ICD-10-CM

## 2022-11-27 NOTE — Telephone Encounter (Signed)
Tried calling patient regarding DM eye exam care gap, but there was no answer.

## 2022-12-02 ENCOUNTER — Telehealth: Payer: Self-pay | Admitting: Nurse Practitioner

## 2022-12-02 ENCOUNTER — Other Ambulatory Visit: Payer: Self-pay | Admitting: Family

## 2022-12-02 MED ORDER — PREGABALIN 150 MG PO CAPS: 150.0000 mg | ORAL_CAPSULE | Freq: Two times a day (BID) | ORAL | 0 refills | Status: DC

## 2022-12-02 NOTE — Telephone Encounter (Signed)
Pt is having trouble with his fingers locking up and would like to increase his pregabalin (LYRICA) 100 MG capsule [[id number].  Pt is also wanting PT orders placed for his overall strength, especially his lower body strength.   Please advise pt at [phone]

## 2022-12-04 NOTE — Telephone Encounter (Signed)
He Is requesting Home PT.

## 2022-12-04 NOTE — Telephone Encounter (Signed)
Pt has an appointment 7/19 and will discuss then

## 2022-12-07 ENCOUNTER — Other Ambulatory Visit: Payer: Self-pay

## 2022-12-07 ENCOUNTER — Other Ambulatory Visit: Payer: Self-pay | Admitting: Nurse Practitioner

## 2022-12-07 ENCOUNTER — Other Ambulatory Visit: Payer: Self-pay | Admitting: Family

## 2022-12-07 DIAGNOSIS — M48062 Spinal stenosis, lumbar region with neurogenic claudication: Secondary | ICD-10-CM

## 2022-12-07 DIAGNOSIS — M4802 Spinal stenosis, cervical region: Secondary | ICD-10-CM

## 2022-12-07 DIAGNOSIS — G8220 Paraplegia, unspecified: Secondary | ICD-10-CM

## 2022-12-07 MED ORDER — BACLOFEN 20 MG PO TABS
20.0000 mg | ORAL_TABLET | Freq: Three times a day (TID) | ORAL | 0 refills | Status: DC
Start: 2022-12-07 — End: 2022-12-09

## 2022-12-07 MED ORDER — BACLOFEN 20 MG PO TABS
20.0000 mg | ORAL_TABLET | Freq: Three times a day (TID) | ORAL | 0 refills | Status: DC
Start: 2022-12-07 — End: 2022-12-07

## 2022-12-07 NOTE — Telephone Encounter (Signed)
Pt needs his baclofen (LIORESAL) 20 MG tablet [[id number]] refilled at [facility].

## 2022-12-09 ENCOUNTER — Telehealth: Payer: Self-pay

## 2022-12-09 ENCOUNTER — Other Ambulatory Visit: Payer: Self-pay | Admitting: Family

## 2022-12-09 DIAGNOSIS — M48062 Spinal stenosis, lumbar region with neurogenic claudication: Secondary | ICD-10-CM

## 2022-12-09 DIAGNOSIS — G8220 Paraplegia, unspecified: Secondary | ICD-10-CM

## 2022-12-09 DIAGNOSIS — M4802 Spinal stenosis, cervical region: Secondary | ICD-10-CM

## 2022-12-09 MED ORDER — BACLOFEN 20 MG PO TABS
20.0000 mg | ORAL_TABLET | Freq: Three times a day (TID) | ORAL | 0 refills | Status: DC
Start: 2022-12-09 — End: 2022-12-18

## 2022-12-09 NOTE — Addendum Note (Signed)
Addended by: Olga Coaster on: 12/09/2022 11:03 AM   Modules accepted: Orders

## 2022-12-09 NOTE — Telephone Encounter (Signed)
Called patient to try and schedule mobile eye exam (10/17) and he declined.  He will schedule his annual eye exam in a couple of months.

## 2022-12-09 NOTE — Telephone Encounter (Signed)
Pt is requesting a 30 day supply. He said  the amount sent was not enough.  He would like it sent to [facility].

## 2022-12-18 ENCOUNTER — Telehealth: Payer: Self-pay

## 2022-12-18 ENCOUNTER — Encounter: Payer: Self-pay | Admitting: Nurse Practitioner

## 2022-12-18 ENCOUNTER — Ambulatory Visit (INDEPENDENT_AMBULATORY_CARE_PROVIDER_SITE_OTHER): Payer: Medicare Other | Admitting: Nurse Practitioner

## 2022-12-18 VITALS — BP 140/69 | HR 97 | Temp 98.2°F | Ht 67.0 in | Wt 178.7 lb

## 2022-12-18 DIAGNOSIS — I10 Essential (primary) hypertension: Secondary | ICD-10-CM | POA: Diagnosis not present

## 2022-12-18 DIAGNOSIS — R269 Unspecified abnormalities of gait and mobility: Secondary | ICD-10-CM

## 2022-12-18 DIAGNOSIS — D509 Iron deficiency anemia, unspecified: Secondary | ICD-10-CM | POA: Diagnosis not present

## 2022-12-18 DIAGNOSIS — E1142 Type 2 diabetes mellitus with diabetic polyneuropathy: Secondary | ICD-10-CM | POA: Diagnosis not present

## 2022-12-18 DIAGNOSIS — M4802 Spinal stenosis, cervical region: Secondary | ICD-10-CM

## 2022-12-18 DIAGNOSIS — Z7984 Long term (current) use of oral hypoglycemic drugs: Secondary | ICD-10-CM | POA: Diagnosis not present

## 2022-12-18 DIAGNOSIS — E1169 Type 2 diabetes mellitus with other specified complication: Secondary | ICD-10-CM | POA: Diagnosis not present

## 2022-12-18 DIAGNOSIS — G8220 Paraplegia, unspecified: Secondary | ICD-10-CM

## 2022-12-18 DIAGNOSIS — G629 Polyneuropathy, unspecified: Secondary | ICD-10-CM

## 2022-12-18 DIAGNOSIS — N3281 Overactive bladder: Secondary | ICD-10-CM | POA: Diagnosis not present

## 2022-12-18 DIAGNOSIS — R809 Proteinuria, unspecified: Secondary | ICD-10-CM | POA: Diagnosis not present

## 2022-12-18 DIAGNOSIS — R350 Frequency of micturition: Secondary | ICD-10-CM | POA: Diagnosis not present

## 2022-12-18 DIAGNOSIS — N401 Enlarged prostate with lower urinary tract symptoms: Secondary | ICD-10-CM

## 2022-12-18 DIAGNOSIS — M48061 Spinal stenosis, lumbar region without neurogenic claudication: Secondary | ICD-10-CM

## 2022-12-18 DIAGNOSIS — E1129 Type 2 diabetes mellitus with other diabetic kidney complication: Secondary | ICD-10-CM

## 2022-12-18 DIAGNOSIS — M48062 Spinal stenosis, lumbar region with neurogenic claudication: Secondary | ICD-10-CM

## 2022-12-18 MED ORDER — BACLOFEN 20 MG PO TABS
20.0000 mg | ORAL_TABLET | Freq: Three times a day (TID) | ORAL | 3 refills | Status: DC
Start: 2022-12-18 — End: 2023-08-19

## 2022-12-18 MED ORDER — MIRABEGRON ER 50 MG PO TB24
50.0000 mg | ORAL_TABLET | Freq: Every day | ORAL | 3 refills | Status: DC
Start: 2022-12-18 — End: 2023-07-30

## 2022-12-18 NOTE — Assessment & Plan Note (Signed)
Reports worsening urinary frequency and incontinence. No hematuria or dysuria or pevic/back pain or fever. Has BM 2x/week, soft formed stool Current use of mybertiq 25mg  with minimal relief  Check UA and PSA today Advised to take colace 100mg  daily to help improve BM frequency Increase mybertriq to 50mg  daily

## 2022-12-18 NOTE — Progress Notes (Signed)
Established Patient Visit  Patient: Larry [name]   DOB: 07/03/1950   72 y.o. Male  MRN: [id number] Visit Date: 12/21/2022  Subjective:   No chief complaint on file.  HPI Accompanied by daughter.  Benign prostatic hyperplasia with lower urinary tract symptoms Reports worsening urinary frequency and incontinence. No hematuria or dysuria or pevic/back pain or fever. Has BM 2x/week, soft formed stool Current use of mybertiq 25mg  with minimal relief  Check UA and PSA today Advised to take colace 100mg  daily to help improve BM frequency Increase mybertriq to 50mg  daily  DM (diabetes mellitus) (HCC) Advised about the importance of annual Dm eye exam  and to schedule appt for annual DM eye exam. He decline appointment with mobile clinic No adverse effects with metformin No glucose check at home BP not at goal due to diet non compliance Repeat hgbA1c, urine microalbumin, and bmp Agreed to nutritionist referral F/up in 3-44months Normal urinalysis, normal PSA Elevated urine microalbumin and hgbA1c at 7.4% : means renal function is affected by DIABETES. We need to improve glucose control through diet and medication modification. Start jardiance 10mg  daily. It is important to maintain adequate oral hydration with water and low carb/sugar diet with this medication. This will prevent yeast infection and renal insufficiency. Maintain metformin dose. I changed olmesartan/amlodipine/hydrochlorothiazide to olmesartan/amlodipine to avoid any renal insufficiency with jardiance. Return to lab in 5month for repeat BMP and urinalysis  Hypertension BP not at goal due to diet non compliance LE edema due to diet non compliance, no ulcers, no cellulitis. No erythema BP Readings from Last 3 Encounters:  12/18/22 (!) 140/69  06/19/22 130/82  03/20/22 120/60    Maintain olmesartan, amlodipine and HCTZ dose Advised to maintain DASH diet and use of compression stocking Repeat  BMP  Paraplegia (HCC) Completed home PT in 08/2022 Reports gradual decline in strength and increase difficulty with mobility in last 2months Wants to resume home PT and OT to help maintain mobility, ROM, and strength Order entered  Iron deficiency anemia Advised about importance of completing fecal occult. Maintain oral iron supplement Advised to also start colace 1tabd daily OVER THE COUNTER to prevent constipation  Wt Readings from Last 3 Encounters:  12/18/22 178 lb 11.2 oz (81.1 kg)  06/19/22 166 lb 3.2 oz (75.4 kg)  06/12/22 165 lb (74.8 kg)    Reviewed medical, surgical, and social history today  Medications: Outpatient Medications Prior to Visit  Medication Sig   ACCU-CHEK AVIVA PLUS test strip TEST FASTING BLOOD SUGAR ONCE  DAILY IN THE MORNING   Accu-Chek FastClix Lancets MISC    atorvastatin (LIPITOR) 20 MG tablet Take 1 tablet (20 mg total) by mouth daily.   Blood Glucose Calibration (ACCU-CHEK AVIVA) SOLN    ferrous sulfate 325 (65 FE) MG tablet Take 1 tablet (325 mg total) by mouth daily with breakfast.   finasteride (PROSCAR) 5 MG tablet Take 1 tablet (5 mg total) by mouth daily.   Incontinence Supply Disposable (DEPEND ADJUSTABLE UNDERWEAR LG) MISC Use as needed.   Lancets Misc. (ACCU-CHEK FASTCLIX LANCET) KIT 1 Units by Does not apply route daily before breakfast.   metFORMIN (GLUCOPHAGE-XR) 750 MG 24 hr tablet TAKE 1 TABLET BY MOUTH DAILY  WITH BREAKFAST   Multiple Vitamin (MULTIVITAMIN) tablet Take 1 tablet by mouth daily.   pantoprazole (PROTONIX) 40 MG tablet TAKE 1 TABLET BY MOUTH TWICE  DAILY BEFORE MEALS   pregabalin (LYRICA) 150  MG capsule Take 1 capsule (150 mg total) by mouth 2 (two) times daily.   traZODone (DESYREL) 50 MG tablet TAKE 1 TABLET BY MOUTH AT  BEDTIME   [DISCONTINUED] baclofen (LIORESAL) 20 MG tablet Take 1 tablet (20 mg total) by mouth 3 (three) times daily.   [DISCONTINUED] baclofen (LIORESAL) 20 MG tablet Take 1 tablet (20 mg total) by  mouth 3 (three) times daily.   [DISCONTINUED] mirabegron ER (MYRBETRIQ) 25 MG TB24 tablet Take 1 tablet (25 mg total) by mouth daily.   [DISCONTINUED] Olmesartan-amLODIPine-HCTZ 40-10-12.5 MG TABS TAKE 1 TABLET BY MOUTH DAILY   No facility-administered medications prior to visit.   Reviewed past medical and social history.   ROS per HPI above      Objective:  BP (!) 140/69   Pulse 97   Temp 98.2 F (36.8 C)   Ht 5\' 7"  (1.702 m)   Wt 178 lb 11.2 oz (81.1 kg)   SpO2 97%   BMI 27.99 kg/m      Physical Exam Vitals reviewed.  Constitutional:      Appearance: He is obese.  Cardiovascular:     Rate and Rhythm: Normal rate and regular rhythm.     Pulses: Normal pulses.     Heart sounds: Normal heart sounds.  Pulmonary:     Effort: Pulmonary effort is normal.     Breath sounds: Normal breath sounds.  Abdominal:     General: Bowel sounds are normal.     Palpations: Abdomen is soft.     Tenderness: There is no abdominal tenderness.  Musculoskeletal:     Right lower leg: Edema present.     Left lower leg: Edema present.  Neurological:     Mental Status: He is alert and oriented to person, place, and time.  Psychiatric:        Mood and Affect: Mood normal.        Behavior: Behavior normal.        Thought Content: Thought content normal.     Results for orders placed or performed in visit on 12/18/22  Hemoglobin A1c  Result Value Ref Range   Hgb A1c MFr Bld 7.4 (H) <5.7 % of total Hgb   Mean Plasma Glucose 166 mg/dL   eAG (mmol/L) 9.2 mmol/L  PSA  Result Value Ref Range   PSA 0.55 < OR = 4.00 ng/mL  Microalbumin / creatinine urine ratio  Result Value Ref Range   Creatinine, Urine 66 20 - 320 mg/dL   Microalb, Ur 3.7 mg/dL   Microalb Creat Ratio 56 (H) <30 mg/g creat  Urinalysis w microscopic + reflex cultur  Result Value Ref Range   Color, Urine YELLOW YELLOW   APPearance CLEAR CLEAR   Specific Gravity, Urine 1.015 1.001 - 1.035   pH 7.0 5.0 - 8.0   Glucose,  UA NEGATIVE NEGATIVE   Bilirubin Urine NEGATIVE NEGATIVE   Ketones, ur NEGATIVE NEGATIVE   Hgb urine dipstick NEGATIVE NEGATIVE   Protein, ur TRACE (A) NEGATIVE   Nitrites, Initial NEGATIVE NEGATIVE   Leukocyte Esterase NEGATIVE NEGATIVE   WBC, UA NONE SEEN 0 - 5 /HPF   RBC / HPF NONE SEEN 0 - 2 /HPF   Squamous Epithelial / HPF NONE SEEN < OR = 5 /HPF   Bacteria, UA NONE SEEN NONE SEEN /HPF   Hyaline Cast NONE SEEN NONE SEEN /LPF   Note    REFLEXIVE URINE CULTURE  Result Value Ref Range   Reflexve Urine Culture  Assessment & Plan:    Problem List Items Addressed This Visit       Cardiovascular and Mediastinum   Hypertension    BP not at goal due to diet non compliance LE edema due to diet non compliance, no ulcers, no cellulitis. No erythema BP Readings from Last 3 Encounters:  12/18/22 (!) 140/69  06/19/22 130/82  03/20/22 120/60    Maintain olmesartan, amlodipine and HCTZ dose Advised to maintain DASH diet and use of compression stocking Repeat BMP      Relevant Medications   amLODipine-olmesartan (AZOR) 10-40 MG tablet   Other Relevant Orders   Basic metabolic panel   Referral to Nutrition and Diabetes Services     Endocrine   DM (diabetes mellitus) (HCC) - Primary    Advised about the importance of annual Dm eye exam  and to schedule appt for annual DM eye exam. He decline appointment with mobile clinic No adverse effects with metformin No glucose check at home BP not at goal due to diet non compliance Repeat hgbA1c, urine microalbumin, and bmp Agreed to nutritionist referral F/up in 3-25months Normal urinalysis, normal PSA Elevated urine microalbumin and hgbA1c at 7.4% : means renal function is affected by DIABETES. We need to improve glucose control through diet and medication modification. Start jardiance 10mg  daily. It is important to maintain adequate oral hydration with water and low carb/sugar diet with this medication. This will prevent yeast  infection and renal insufficiency. Maintain metformin dose. I changed olmesartan/amlodipine/hydrochlorothiazide to olmesartan/amlodipine to avoid any renal insufficiency with jardiance. Return to lab in 80month for repeat BMP and urinalysis      Relevant Medications   empagliflozin (JARDIANCE) 10 MG TABS tablet   amLODipine-olmesartan (AZOR) 10-40 MG tablet   Other Relevant Orders   Hemoglobin A1c (Completed)   Basic metabolic panel   Referral to Nutrition and Diabetes Services   Microalbumin / creatinine urine ratio   Basic metabolic panel   Urinalysis w microscopic + reflex cultur   Hyperlipidemia associated with type 2 diabetes mellitus (HCC)   Relevant Medications   empagliflozin (JARDIANCE) 10 MG TABS tablet   amLODipine-olmesartan (AZOR) 10-40 MG tablet   Other Relevant Orders   Referral to Nutrition and Diabetes Services     Nervous and Auditory   Paraplegia (HCC)    Completed home PT in 08/2022 Reports gradual decline in strength and increase difficulty with mobility in last 2months Wants to resume home PT and OT to help maintain mobility, ROM, and strength Order entered      Relevant Medications   baclofen (LIORESAL) 20 MG tablet   Other Relevant Orders   Ambulatory referral to Home Health   Peripheral neuropathy   Relevant Medications   baclofen (LIORESAL) 20 MG tablet   Other Relevant Orders   Ambulatory referral to Home Health   Spinal stenosis of lumbar region with radiculopathy   Relevant Medications   baclofen (LIORESAL) 20 MG tablet     Genitourinary   Benign prostatic hyperplasia with lower urinary tract symptoms    Reports worsening urinary frequency and incontinence. No hematuria or dysuria or pevic/back pain or fever. Has BM 2x/week, soft formed stool Current use of mybertiq 25mg  with minimal relief  Check UA and PSA today Advised to take colace 100mg  daily to help improve BM frequency Increase mybertriq to 50mg  daily      Relevant Orders    PSA (Completed)   Urinalysis w microscopic + reflex cultur   Overactive bladder   Relevant  Medications   mirabegron ER (MYRBETRIQ) 50 MG TB24 tablet   Other Relevant Orders   PSA (Completed)   Urinalysis w microscopic + reflex cultur     Other   Gait abnormality   Relevant Orders   Ambulatory referral to Home Health   Iron deficiency anemia    Advised about importance of completing fecal occult. Maintain oral iron supplement Advised to also start colace 1tabd daily OVER THE COUNTER to prevent constipation      Relevant Orders   Fecal occult blood, imunochemical   Spinal stenosis in cervical region   Relevant Medications   baclofen (LIORESAL) 20 MG tablet   Other Relevant Orders   Ambulatory referral to Home Health   Other Visit Diagnoses     Spinal stenosis, lumbar region, with neurogenic claudication       Relevant Medications   baclofen (LIORESAL) 20 MG tablet   Other Relevant Orders   Ambulatory referral to Home Health   Microalbuminuria due to type 2 diabetes mellitus (HCC)       Relevant Medications   empagliflozin (JARDIANCE) 10 MG TABS tablet   amLODipine-olmesartan (AZOR) 10-40 MG tablet   Other Relevant Orders   Basic metabolic panel   Urinalysis w microscopic + reflex cultur   Long term (current) use of oral hypoglycemic drugs       Relevant Orders   Basic metabolic panel   Urinalysis w microscopic + reflex cultur      Return in about 6 months (around 06/20/2023) for HTN, DM, hyperlipidemia (fasting).     Alysia Penna, NP

## 2022-12-18 NOTE — Patient Instructions (Addendum)
Start colace/docusate sodium 100mg  1-2tabs daily OVER THE COUNTER You eill be contacted about appointment with nutritionist, home PT and OT. Maintain low sodium diet. Schedule appointment for annual DIABETES eye exam

## 2022-12-18 NOTE — Assessment & Plan Note (Signed)
Completed home PT in 08/2022 Reports gradual decline in strength and increase difficulty with mobility in last 2months Wants to resume home PT and OT to help maintain mobility, ROM, and strength Order entered

## 2022-12-18 NOTE — Assessment & Plan Note (Signed)
Advised about the importance of annual Dm eye exam  and to schedule appt for annual DM eye exam. He decline appointment with mobile clinic No adverse effects with metformin No glucose check at home BP not at goal due to diet non compliance Repeat hgbA1c, urine microalbumin, and bmp Agreed to nutritionist referral F/up in 3-20months Normal urinalysis, normal PSA Elevated urine microalbumin and hgbA1c at 7.4% : means renal function is affected by DIABETES. We need to improve glucose control through diet and medication modification. Start jardiance 10mg  daily. It is important to maintain adequate oral hydration with water and low carb/sugar diet with this medication. This will prevent yeast infection and renal insufficiency. Maintain metformin dose. I changed olmesartan/amlodipine/hydrochlorothiazide to olmesartan/amlodipine to avoid any renal insufficiency with jardiance. Return to lab in 8month for repeat BMP and urinalysis

## 2022-12-18 NOTE — Telephone Encounter (Signed)
Pt left clinic without going to thel ab. Called to advise pt to rtc for sterile urine cup and iFOBT, and lab work. Pt's daughter is bringing him back to the lab.

## 2022-12-18 NOTE — Addendum Note (Signed)
Addended by: Olga Coaster on: 12/18/2022 02:50 PM   Modules accepted: Orders

## 2022-12-18 NOTE — Assessment & Plan Note (Signed)
BP not at goal due to diet non compliance LE edema due to diet non compliance, no ulcers, no cellulitis. No erythema BP Readings from Last 3 Encounters:  12/18/22 (!) 140/69  06/19/22 130/82  03/20/22 120/60    Maintain olmesartan, amlodipine and HCTZ dose Advised to maintain DASH diet and use of compression stocking Repeat BMP

## 2022-12-18 NOTE — Assessment & Plan Note (Signed)
Advised about importance of completing fecal occult. Maintain oral iron supplement Advised to also start colace 1tabd daily OVER THE COUNTER to prevent constipation

## 2022-12-19 LAB — URINALYSIS W MICROSCOPIC + REFLEX CULTURE
Bacteria, UA: NONE SEEN /HPF
Bilirubin Urine: NEGATIVE
Glucose, UA: NEGATIVE
Hgb urine dipstick: NEGATIVE
Hyaline Cast: NONE SEEN /LPF
Ketones, ur: NEGATIVE
Leukocyte Esterase: NEGATIVE
Nitrites, Initial: NEGATIVE
RBC / HPF: NONE SEEN /HPF (ref 0–2)
Specific Gravity, Urine: 1.015 (ref 1.001–1.035)
Squamous Epithelial / HPF: NONE SEEN /HPF (ref <=5)
WBC, UA: NONE SEEN /HPF (ref 0–5)
pH: 7 (ref 5.0–8.0)

## 2022-12-19 LAB — MICROALBUMIN / CREATININE URINE RATIO
Creatinine, Urine: 66 mg/dL (ref 20–320)
Microalb Creat Ratio: 56 mg/g creat — ABNORMAL HIGH (ref <30)
Microalb, Ur: 3.7 mg/dL

## 2022-12-19 LAB — HEMOGLOBIN A1C
Hgb A1c MFr Bld: 7.4 % of total Hgb — ABNORMAL HIGH (ref <5.7)
Mean Plasma Glucose: 166 mg/dL
eAG (mmol/L): 9.2 mmol/L

## 2022-12-19 LAB — PSA: PSA: 0.55 ng/mL (ref <=4.00)

## 2022-12-19 LAB — NO CULTURE INDICATED

## 2022-12-21 MED ORDER — EMPAGLIFLOZIN 10 MG PO TABS
10.0000 mg | ORAL_TABLET | Freq: Every day | ORAL | 1 refills | Status: AC
Start: 2022-12-21 — End: ?

## 2022-12-21 MED ORDER — AMLODIPINE-OLMESARTAN 10-40 MG PO TABS
1.0000 | ORAL_TABLET | Freq: Every day | ORAL | 3 refills | Status: DC
Start: 2022-12-21 — End: 2023-08-23

## 2022-12-21 NOTE — Addendum Note (Signed)
Addended by: Michaela Corner on: 12/21/2022 09:21 AM   Modules accepted: Orders

## 2022-12-23 ENCOUNTER — Telehealth: Payer: Self-pay | Admitting: Nurse Practitioner

## 2022-12-23 NOTE — Telephone Encounter (Signed)
I advised patient of result annotation from Pcp "Start jardiance 10mg  daily. It is important to maintain adequate oral hydration with water and low carb/sugar diet with this medication. This will prevent yeast infection and renal insufficiency. Maintain metformin dose. I changed olmesartan/amlodipine/hydrochlorothiazide to olmesartan/amlodipine to avoid any renal insufficiency with jardiance." He verbalized understanding.

## 2022-12-23 NOTE — Telephone Encounter (Signed)
Pt stated he was prescribed Jardiance and can not remember what he was told to stop taking. Please call!

## 2022-12-24 NOTE — Progress Notes (Signed)
CALLED pt and left [place]'s instruction to return for labs

## 2022-12-25 NOTE — Progress Notes (Addendum)
Called and talked to [place].  He will have to wait until his daughter can bring him back.  I told him to call the office and schedule a lab visit when his daughter can bring him back.   Pt expressed understanding

## 2023-01-01 ENCOUNTER — Other Ambulatory Visit: Payer: Self-pay | Admitting: Nurse Practitioner

## 2023-01-01 DIAGNOSIS — E1142 Type 2 diabetes mellitus with diabetic polyneuropathy: Secondary | ICD-10-CM

## 2023-01-08 ENCOUNTER — Other Ambulatory Visit: Payer: Self-pay

## 2023-01-08 ENCOUNTER — Telehealth: Payer: Self-pay | Admitting: Nurse Practitioner

## 2023-01-08 MED ORDER — PREGABALIN 150 MG PO CAPS: 150.0000 mg | ORAL_CAPSULE | Freq: Two times a day (BID) | ORAL | 1 refills | Status: DC

## 2023-01-08 NOTE — Telephone Encounter (Signed)
Sent Message to [employer].  She sent information to [facility] and she is waiting for their reply.

## 2023-01-08 NOTE — Telephone Encounter (Signed)
01/08/23 - Pt called stating that he was referred for a physical therapy on his last appointment but he is yet to get a call from them. He is asking for a call back on [phone].

## 2023-01-11 ENCOUNTER — Telehealth: Payer: Self-pay | Admitting: Nurse Practitioner

## 2023-01-11 DIAGNOSIS — M48062 Spinal stenosis, lumbar region with neurogenic claudication: Secondary | ICD-10-CM | POA: Diagnosis not present

## 2023-01-11 DIAGNOSIS — E1142 Type 2 diabetes mellitus with diabetic polyneuropathy: Secondary | ICD-10-CM | POA: Diagnosis not present

## 2023-01-11 DIAGNOSIS — G8222 Paraplegia, incomplete: Secondary | ICD-10-CM | POA: Diagnosis not present

## 2023-01-11 DIAGNOSIS — E1169 Type 2 diabetes mellitus with other specified complication: Secondary | ICD-10-CM | POA: Diagnosis not present

## 2023-01-11 DIAGNOSIS — E1129 Type 2 diabetes mellitus with other diabetic kidney complication: Secondary | ICD-10-CM | POA: Diagnosis not present

## 2023-01-11 DIAGNOSIS — M4802 Spinal stenosis, cervical region: Secondary | ICD-10-CM | POA: Diagnosis not present

## 2023-01-11 NOTE — Telephone Encounter (Signed)
Checking to see if Larry [name] was at [facility].  Patient called Friday 01/08/23 acquiring when he will start PT.  Per Brett Canales he started 8/12. Gave him verbal for PT orders

## 2023-01-11 NOTE — Telephone Encounter (Signed)
Home Health verbal orders   Caller Name:  Brett Canales Agency Name: [facility]  Callback number: [phone]  vm secure  Requesting OT/PT/Skilled nursing/Social Work/Speech:  PT  2w/3, 1w/5    and  OT evaluation  Reason:   Please forward to [facility] pool or providers CMA

## 2023-01-11 NOTE — Telephone Encounter (Signed)
LVMTRC 

## 2023-01-15 DIAGNOSIS — E1129 Type 2 diabetes mellitus with other diabetic kidney complication: Secondary | ICD-10-CM | POA: Diagnosis not present

## 2023-01-15 DIAGNOSIS — G8222 Paraplegia, incomplete: Secondary | ICD-10-CM | POA: Diagnosis not present

## 2023-01-15 DIAGNOSIS — E1142 Type 2 diabetes mellitus with diabetic polyneuropathy: Secondary | ICD-10-CM | POA: Diagnosis not present

## 2023-01-15 DIAGNOSIS — E1169 Type 2 diabetes mellitus with other specified complication: Secondary | ICD-10-CM | POA: Diagnosis not present

## 2023-01-15 DIAGNOSIS — M4802 Spinal stenosis, cervical region: Secondary | ICD-10-CM | POA: Diagnosis not present

## 2023-01-15 DIAGNOSIS — M48062 Spinal stenosis, lumbar region with neurogenic claudication: Secondary | ICD-10-CM | POA: Diagnosis not present

## 2023-01-18 DIAGNOSIS — E1169 Type 2 diabetes mellitus with other specified complication: Secondary | ICD-10-CM | POA: Diagnosis not present

## 2023-01-18 DIAGNOSIS — E1129 Type 2 diabetes mellitus with other diabetic kidney complication: Secondary | ICD-10-CM | POA: Diagnosis not present

## 2023-01-18 DIAGNOSIS — E1142 Type 2 diabetes mellitus with diabetic polyneuropathy: Secondary | ICD-10-CM | POA: Diagnosis not present

## 2023-01-18 DIAGNOSIS — M48062 Spinal stenosis, lumbar region with neurogenic claudication: Secondary | ICD-10-CM | POA: Diagnosis not present

## 2023-01-18 DIAGNOSIS — M4802 Spinal stenosis, cervical region: Secondary | ICD-10-CM | POA: Diagnosis not present

## 2023-01-18 DIAGNOSIS — G8222 Paraplegia, incomplete: Secondary | ICD-10-CM | POA: Diagnosis not present

## 2023-01-20 DIAGNOSIS — M48062 Spinal stenosis, lumbar region with neurogenic claudication: Secondary | ICD-10-CM | POA: Diagnosis not present

## 2023-01-20 DIAGNOSIS — M4802 Spinal stenosis, cervical region: Secondary | ICD-10-CM | POA: Diagnosis not present

## 2023-01-20 DIAGNOSIS — E1142 Type 2 diabetes mellitus with diabetic polyneuropathy: Secondary | ICD-10-CM | POA: Diagnosis not present

## 2023-01-20 DIAGNOSIS — E1169 Type 2 diabetes mellitus with other specified complication: Secondary | ICD-10-CM | POA: Diagnosis not present

## 2023-01-20 DIAGNOSIS — G8222 Paraplegia, incomplete: Secondary | ICD-10-CM | POA: Diagnosis not present

## 2023-01-20 DIAGNOSIS — E1129 Type 2 diabetes mellitus with other diabetic kidney complication: Secondary | ICD-10-CM | POA: Diagnosis not present

## 2023-01-21 ENCOUNTER — Telehealth: Payer: Self-pay | Admitting: Nurse Practitioner

## 2023-01-21 DIAGNOSIS — E1142 Type 2 diabetes mellitus with diabetic polyneuropathy: Secondary | ICD-10-CM | POA: Diagnosis not present

## 2023-01-21 DIAGNOSIS — M48062 Spinal stenosis, lumbar region with neurogenic claudication: Secondary | ICD-10-CM | POA: Diagnosis not present

## 2023-01-21 DIAGNOSIS — E1129 Type 2 diabetes mellitus with other diabetic kidney complication: Secondary | ICD-10-CM | POA: Diagnosis not present

## 2023-01-21 DIAGNOSIS — G8222 Paraplegia, incomplete: Secondary | ICD-10-CM | POA: Diagnosis not present

## 2023-01-21 DIAGNOSIS — M4802 Spinal stenosis, cervical region: Secondary | ICD-10-CM | POA: Diagnosis not present

## 2023-01-21 DIAGNOSIS — E1169 Type 2 diabetes mellitus with other specified complication: Secondary | ICD-10-CM | POA: Diagnosis not present

## 2023-01-21 NOTE — Telephone Encounter (Signed)
Marcelino Duster from [employer]  [phone]  Would like to request orders for the pt  1 wk1  2 wk 2  1 wk 2 wk

## 2023-01-22 ENCOUNTER — Other Ambulatory Visit: Payer: Medicare Other

## 2023-01-22 DIAGNOSIS — R809 Proteinuria, unspecified: Secondary | ICD-10-CM | POA: Diagnosis not present

## 2023-01-22 DIAGNOSIS — E1142 Type 2 diabetes mellitus with diabetic polyneuropathy: Secondary | ICD-10-CM | POA: Diagnosis not present

## 2023-01-22 DIAGNOSIS — E1129 Type 2 diabetes mellitus with other diabetic kidney complication: Secondary | ICD-10-CM | POA: Diagnosis not present

## 2023-01-22 DIAGNOSIS — N3281 Overactive bladder: Secondary | ICD-10-CM | POA: Diagnosis not present

## 2023-01-22 DIAGNOSIS — Z7984 Long term (current) use of oral hypoglycemic drugs: Secondary | ICD-10-CM

## 2023-01-22 DIAGNOSIS — N401 Enlarged prostate with lower urinary tract symptoms: Secondary | ICD-10-CM

## 2023-01-22 NOTE — Addendum Note (Signed)
Addended by: Lindley Magnus L on: 01/22/2023 03:55 PM   Modules accepted: Orders

## 2023-01-23 LAB — BASIC METABOLIC PANEL
BUN: 16 mg/dL (ref 7–25)
CO2: 22 mmol/L (ref 20–32)
Calcium: 10 mg/dL (ref 8.6–10.3)
Chloride: 107 mmol/L (ref 98–110)
Creat: 1.08 mg/dL (ref 0.70–1.28)
Glucose, Bld: 99 mg/dL (ref 65–99)
Potassium: 4.2 mmol/L (ref 3.5–5.3)
Sodium: 142 mmol/L (ref 135–146)

## 2023-01-25 ENCOUNTER — Telehealth: Payer: Self-pay | Admitting: Nurse Practitioner

## 2023-01-25 DIAGNOSIS — G8222 Paraplegia, incomplete: Secondary | ICD-10-CM | POA: Diagnosis not present

## 2023-01-25 DIAGNOSIS — E1129 Type 2 diabetes mellitus with other diabetic kidney complication: Secondary | ICD-10-CM | POA: Diagnosis not present

## 2023-01-25 DIAGNOSIS — M4802 Spinal stenosis, cervical region: Secondary | ICD-10-CM | POA: Diagnosis not present

## 2023-01-25 DIAGNOSIS — E1169 Type 2 diabetes mellitus with other specified complication: Secondary | ICD-10-CM | POA: Diagnosis not present

## 2023-01-25 DIAGNOSIS — M48062 Spinal stenosis, lumbar region with neurogenic claudication: Secondary | ICD-10-CM | POA: Diagnosis not present

## 2023-01-25 DIAGNOSIS — E1142 Type 2 diabetes mellitus with diabetic polyneuropathy: Secondary | ICD-10-CM | POA: Diagnosis not present

## 2023-01-25 NOTE — Telephone Encounter (Signed)
Patient dropped off document Home Health Certificate (Order ID  ), to be filled out by provider. Patient requested to send it back via Fax within 5-days. Document is located in providers tray at front office.Please advise at Mobile [phone] (mobile)   Homehealth fax . Put in the dr box

## 2023-01-25 NOTE — Telephone Encounter (Signed)
Left the ok for verbal order on Michelle's voice mail

## 2023-01-25 NOTE — Telephone Encounter (Signed)
 PAPERWORK/FORMS received   CLINICAL USE BELOW THIS LINE (use X to signify action taken)  _X_ Form received and placed in providers office for signature. ___ Form completed and faxed to LOA Dept.  ___ Form completed & LVM to notify patient ready for pick up.  ___ Charge sheet and copy of form in front office folder for office supervisor.

## 2023-01-26 DIAGNOSIS — E785 Hyperlipidemia, unspecified: Secondary | ICD-10-CM | POA: Diagnosis not present

## 2023-01-26 DIAGNOSIS — N39498 Other specified urinary incontinence: Secondary | ICD-10-CM

## 2023-01-26 DIAGNOSIS — Z7984 Long term (current) use of oral hypoglycemic drugs: Secondary | ICD-10-CM

## 2023-01-26 DIAGNOSIS — G8222 Paraplegia, incomplete: Secondary | ICD-10-CM | POA: Diagnosis not present

## 2023-01-26 DIAGNOSIS — E1129 Type 2 diabetes mellitus with other diabetic kidney complication: Secondary | ICD-10-CM | POA: Diagnosis not present

## 2023-01-26 DIAGNOSIS — M48062 Spinal stenosis, lumbar region with neurogenic claudication: Secondary | ICD-10-CM | POA: Diagnosis not present

## 2023-01-26 DIAGNOSIS — Z6826 Body mass index (BMI) 26.0-26.9, adult: Secondary | ICD-10-CM

## 2023-01-26 DIAGNOSIS — D509 Iron deficiency anemia, unspecified: Secondary | ICD-10-CM | POA: Diagnosis not present

## 2023-01-26 DIAGNOSIS — I10 Essential (primary) hypertension: Secondary | ICD-10-CM | POA: Diagnosis not present

## 2023-01-26 DIAGNOSIS — E669 Obesity, unspecified: Secondary | ICD-10-CM

## 2023-01-26 DIAGNOSIS — E1142 Type 2 diabetes mellitus with diabetic polyneuropathy: Secondary | ICD-10-CM | POA: Diagnosis not present

## 2023-01-26 DIAGNOSIS — M4802 Spinal stenosis, cervical region: Secondary | ICD-10-CM | POA: Diagnosis not present

## 2023-01-26 DIAGNOSIS — E1169 Type 2 diabetes mellitus with other specified complication: Secondary | ICD-10-CM | POA: Diagnosis not present

## 2023-01-26 DIAGNOSIS — N401 Enlarged prostate with lower urinary tract symptoms: Secondary | ICD-10-CM

## 2023-01-26 DIAGNOSIS — R350 Frequency of micturition: Secondary | ICD-10-CM | POA: Diagnosis not present

## 2023-01-26 NOTE — Telephone Encounter (Signed)
error 

## 2023-01-27 DIAGNOSIS — G8222 Paraplegia, incomplete: Secondary | ICD-10-CM | POA: Diagnosis not present

## 2023-01-27 DIAGNOSIS — M4802 Spinal stenosis, cervical region: Secondary | ICD-10-CM | POA: Diagnosis not present

## 2023-01-27 DIAGNOSIS — E1142 Type 2 diabetes mellitus with diabetic polyneuropathy: Secondary | ICD-10-CM | POA: Diagnosis not present

## 2023-01-27 DIAGNOSIS — E1129 Type 2 diabetes mellitus with other diabetic kidney complication: Secondary | ICD-10-CM | POA: Diagnosis not present

## 2023-01-27 DIAGNOSIS — E1169 Type 2 diabetes mellitus with other specified complication: Secondary | ICD-10-CM | POA: Diagnosis not present

## 2023-01-27 DIAGNOSIS — M48062 Spinal stenosis, lumbar region with neurogenic claudication: Secondary | ICD-10-CM | POA: Diagnosis not present

## 2023-01-28 DIAGNOSIS — M4802 Spinal stenosis, cervical region: Secondary | ICD-10-CM | POA: Diagnosis not present

## 2023-01-28 DIAGNOSIS — E1169 Type 2 diabetes mellitus with other specified complication: Secondary | ICD-10-CM | POA: Diagnosis not present

## 2023-01-28 DIAGNOSIS — G8222 Paraplegia, incomplete: Secondary | ICD-10-CM | POA: Diagnosis not present

## 2023-01-28 DIAGNOSIS — M48062 Spinal stenosis, lumbar region with neurogenic claudication: Secondary | ICD-10-CM | POA: Diagnosis not present

## 2023-01-28 DIAGNOSIS — E1129 Type 2 diabetes mellitus with other diabetic kidney complication: Secondary | ICD-10-CM | POA: Diagnosis not present

## 2023-01-28 DIAGNOSIS — E1142 Type 2 diabetes mellitus with diabetic polyneuropathy: Secondary | ICD-10-CM | POA: Diagnosis not present

## 2023-02-03 DIAGNOSIS — M4802 Spinal stenosis, cervical region: Secondary | ICD-10-CM | POA: Diagnosis not present

## 2023-02-03 DIAGNOSIS — E1142 Type 2 diabetes mellitus with diabetic polyneuropathy: Secondary | ICD-10-CM | POA: Diagnosis not present

## 2023-02-03 DIAGNOSIS — M48062 Spinal stenosis, lumbar region with neurogenic claudication: Secondary | ICD-10-CM | POA: Diagnosis not present

## 2023-02-03 DIAGNOSIS — E1169 Type 2 diabetes mellitus with other specified complication: Secondary | ICD-10-CM | POA: Diagnosis not present

## 2023-02-03 DIAGNOSIS — G8222 Paraplegia, incomplete: Secondary | ICD-10-CM | POA: Diagnosis not present

## 2023-02-03 DIAGNOSIS — E1129 Type 2 diabetes mellitus with other diabetic kidney complication: Secondary | ICD-10-CM | POA: Diagnosis not present

## 2023-02-04 DIAGNOSIS — M4802 Spinal stenosis, cervical region: Secondary | ICD-10-CM | POA: Diagnosis not present

## 2023-02-04 DIAGNOSIS — E1142 Type 2 diabetes mellitus with diabetic polyneuropathy: Secondary | ICD-10-CM | POA: Diagnosis not present

## 2023-02-04 DIAGNOSIS — E1169 Type 2 diabetes mellitus with other specified complication: Secondary | ICD-10-CM | POA: Diagnosis not present

## 2023-02-04 DIAGNOSIS — M48062 Spinal stenosis, lumbar region with neurogenic claudication: Secondary | ICD-10-CM | POA: Diagnosis not present

## 2023-02-04 DIAGNOSIS — E1129 Type 2 diabetes mellitus with other diabetic kidney complication: Secondary | ICD-10-CM | POA: Diagnosis not present

## 2023-02-04 DIAGNOSIS — G8222 Paraplegia, incomplete: Secondary | ICD-10-CM | POA: Diagnosis not present

## 2023-02-05 DIAGNOSIS — E1142 Type 2 diabetes mellitus with diabetic polyneuropathy: Secondary | ICD-10-CM | POA: Diagnosis not present

## 2023-02-05 DIAGNOSIS — M48062 Spinal stenosis, lumbar region with neurogenic claudication: Secondary | ICD-10-CM | POA: Diagnosis not present

## 2023-02-05 DIAGNOSIS — E1129 Type 2 diabetes mellitus with other diabetic kidney complication: Secondary | ICD-10-CM | POA: Diagnosis not present

## 2023-02-05 DIAGNOSIS — E1169 Type 2 diabetes mellitus with other specified complication: Secondary | ICD-10-CM | POA: Diagnosis not present

## 2023-02-05 DIAGNOSIS — G8222 Paraplegia, incomplete: Secondary | ICD-10-CM | POA: Diagnosis not present

## 2023-02-05 DIAGNOSIS — M4802 Spinal stenosis, cervical region: Secondary | ICD-10-CM | POA: Diagnosis not present

## 2023-02-09 DIAGNOSIS — G8222 Paraplegia, incomplete: Secondary | ICD-10-CM | POA: Diagnosis not present

## 2023-02-09 DIAGNOSIS — E1169 Type 2 diabetes mellitus with other specified complication: Secondary | ICD-10-CM | POA: Diagnosis not present

## 2023-02-09 DIAGNOSIS — E1142 Type 2 diabetes mellitus with diabetic polyneuropathy: Secondary | ICD-10-CM | POA: Diagnosis not present

## 2023-02-09 DIAGNOSIS — M4802 Spinal stenosis, cervical region: Secondary | ICD-10-CM | POA: Diagnosis not present

## 2023-02-09 DIAGNOSIS — M48062 Spinal stenosis, lumbar region with neurogenic claudication: Secondary | ICD-10-CM | POA: Diagnosis not present

## 2023-02-09 DIAGNOSIS — E1129 Type 2 diabetes mellitus with other diabetic kidney complication: Secondary | ICD-10-CM | POA: Diagnosis not present

## 2023-02-10 DIAGNOSIS — G8222 Paraplegia, incomplete: Secondary | ICD-10-CM | POA: Diagnosis not present

## 2023-02-10 DIAGNOSIS — E1142 Type 2 diabetes mellitus with diabetic polyneuropathy: Secondary | ICD-10-CM | POA: Diagnosis not present

## 2023-02-10 DIAGNOSIS — M4802 Spinal stenosis, cervical region: Secondary | ICD-10-CM | POA: Diagnosis not present

## 2023-02-10 DIAGNOSIS — M48062 Spinal stenosis, lumbar region with neurogenic claudication: Secondary | ICD-10-CM | POA: Diagnosis not present

## 2023-02-10 DIAGNOSIS — E1129 Type 2 diabetes mellitus with other diabetic kidney complication: Secondary | ICD-10-CM | POA: Diagnosis not present

## 2023-02-10 DIAGNOSIS — E1169 Type 2 diabetes mellitus with other specified complication: Secondary | ICD-10-CM | POA: Diagnosis not present

## 2023-02-16 DIAGNOSIS — G8222 Paraplegia, incomplete: Secondary | ICD-10-CM | POA: Diagnosis not present

## 2023-02-16 DIAGNOSIS — E1129 Type 2 diabetes mellitus with other diabetic kidney complication: Secondary | ICD-10-CM | POA: Diagnosis not present

## 2023-02-16 DIAGNOSIS — M4802 Spinal stenosis, cervical region: Secondary | ICD-10-CM | POA: Diagnosis not present

## 2023-02-16 DIAGNOSIS — M48062 Spinal stenosis, lumbar region with neurogenic claudication: Secondary | ICD-10-CM | POA: Diagnosis not present

## 2023-02-16 DIAGNOSIS — E1169 Type 2 diabetes mellitus with other specified complication: Secondary | ICD-10-CM | POA: Diagnosis not present

## 2023-02-16 DIAGNOSIS — E1142 Type 2 diabetes mellitus with diabetic polyneuropathy: Secondary | ICD-10-CM | POA: Diagnosis not present

## 2023-02-19 DIAGNOSIS — M48062 Spinal stenosis, lumbar region with neurogenic claudication: Secondary | ICD-10-CM | POA: Diagnosis not present

## 2023-02-19 DIAGNOSIS — M4802 Spinal stenosis, cervical region: Secondary | ICD-10-CM | POA: Diagnosis not present

## 2023-02-19 DIAGNOSIS — E1169 Type 2 diabetes mellitus with other specified complication: Secondary | ICD-10-CM | POA: Diagnosis not present

## 2023-02-19 DIAGNOSIS — E1129 Type 2 diabetes mellitus with other diabetic kidney complication: Secondary | ICD-10-CM | POA: Diagnosis not present

## 2023-02-19 DIAGNOSIS — G8222 Paraplegia, incomplete: Secondary | ICD-10-CM | POA: Diagnosis not present

## 2023-02-19 DIAGNOSIS — E1142 Type 2 diabetes mellitus with diabetic polyneuropathy: Secondary | ICD-10-CM | POA: Diagnosis not present

## 2023-02-23 DIAGNOSIS — G8222 Paraplegia, incomplete: Secondary | ICD-10-CM | POA: Diagnosis not present

## 2023-02-23 DIAGNOSIS — E1142 Type 2 diabetes mellitus with diabetic polyneuropathy: Secondary | ICD-10-CM | POA: Diagnosis not present

## 2023-02-23 DIAGNOSIS — E1169 Type 2 diabetes mellitus with other specified complication: Secondary | ICD-10-CM | POA: Diagnosis not present

## 2023-02-23 DIAGNOSIS — M4802 Spinal stenosis, cervical region: Secondary | ICD-10-CM | POA: Diagnosis not present

## 2023-02-23 DIAGNOSIS — E1129 Type 2 diabetes mellitus with other diabetic kidney complication: Secondary | ICD-10-CM | POA: Diagnosis not present

## 2023-02-23 DIAGNOSIS — M48062 Spinal stenosis, lumbar region with neurogenic claudication: Secondary | ICD-10-CM | POA: Diagnosis not present

## 2023-02-28 ENCOUNTER — Other Ambulatory Visit: Payer: Self-pay | Admitting: Nurse Practitioner

## 2023-02-28 DIAGNOSIS — E1129 Type 2 diabetes mellitus with other diabetic kidney complication: Secondary | ICD-10-CM

## 2023-02-28 DIAGNOSIS — E1142 Type 2 diabetes mellitus with diabetic polyneuropathy: Secondary | ICD-10-CM

## 2023-03-03 DIAGNOSIS — E1169 Type 2 diabetes mellitus with other specified complication: Secondary | ICD-10-CM | POA: Diagnosis not present

## 2023-03-03 DIAGNOSIS — M48062 Spinal stenosis, lumbar region with neurogenic claudication: Secondary | ICD-10-CM | POA: Diagnosis not present

## 2023-03-03 DIAGNOSIS — M4802 Spinal stenosis, cervical region: Secondary | ICD-10-CM | POA: Diagnosis not present

## 2023-03-03 DIAGNOSIS — G8222 Paraplegia, incomplete: Secondary | ICD-10-CM | POA: Diagnosis not present

## 2023-03-03 DIAGNOSIS — E1129 Type 2 diabetes mellitus with other diabetic kidney complication: Secondary | ICD-10-CM | POA: Diagnosis not present

## 2023-03-03 DIAGNOSIS — E1142 Type 2 diabetes mellitus with diabetic polyneuropathy: Secondary | ICD-10-CM | POA: Diagnosis not present

## 2023-03-09 DIAGNOSIS — M48062 Spinal stenosis, lumbar region with neurogenic claudication: Secondary | ICD-10-CM | POA: Diagnosis not present

## 2023-03-09 DIAGNOSIS — E1169 Type 2 diabetes mellitus with other specified complication: Secondary | ICD-10-CM | POA: Diagnosis not present

## 2023-03-09 DIAGNOSIS — E1129 Type 2 diabetes mellitus with other diabetic kidney complication: Secondary | ICD-10-CM | POA: Diagnosis not present

## 2023-03-09 DIAGNOSIS — G8222 Paraplegia, incomplete: Secondary | ICD-10-CM | POA: Diagnosis not present

## 2023-03-09 DIAGNOSIS — M4802 Spinal stenosis, cervical region: Secondary | ICD-10-CM | POA: Diagnosis not present

## 2023-03-09 DIAGNOSIS — E1142 Type 2 diabetes mellitus with diabetic polyneuropathy: Secondary | ICD-10-CM | POA: Diagnosis not present

## 2023-03-12 ENCOUNTER — Other Ambulatory Visit: Payer: Self-pay | Admitting: Nurse Practitioner

## 2023-03-12 DIAGNOSIS — E1142 Type 2 diabetes mellitus with diabetic polyneuropathy: Secondary | ICD-10-CM

## 2023-03-12 DIAGNOSIS — G629 Polyneuropathy, unspecified: Secondary | ICD-10-CM

## 2023-04-06 ENCOUNTER — Other Ambulatory Visit: Payer: Self-pay | Admitting: Nurse Practitioner

## 2023-04-15 ENCOUNTER — Other Ambulatory Visit: Payer: Self-pay | Admitting: Nurse Practitioner

## 2023-04-15 DIAGNOSIS — N401 Enlarged prostate with lower urinary tract symptoms: Secondary | ICD-10-CM

## 2023-04-30 ENCOUNTER — Other Ambulatory Visit: Payer: Self-pay | Admitting: Nurse Practitioner

## 2023-04-30 DIAGNOSIS — G4700 Insomnia, unspecified: Secondary | ICD-10-CM

## 2023-05-20 ENCOUNTER — Other Ambulatory Visit: Payer: Self-pay | Admitting: Nurse Practitioner

## 2023-05-20 DIAGNOSIS — E1169 Type 2 diabetes mellitus with other specified complication: Secondary | ICD-10-CM

## 2023-05-24 NOTE — Telephone Encounter (Signed)
Chart supports rx. Last OV: 12/18/2022 Next OV: 06/25/2023

## 2023-06-14 ENCOUNTER — Ambulatory Visit (INDEPENDENT_AMBULATORY_CARE_PROVIDER_SITE_OTHER): Payer: Medicare Other

## 2023-06-14 DIAGNOSIS — Z0000 Encounter for general adult medical examination without abnormal findings: Secondary | ICD-10-CM

## 2023-06-14 NOTE — Patient Instructions (Signed)
 Mr. Hinnant , Thank you for taking time to come for your Medicare Wellness Visit. I appreciate your ongoing commitment to your health goals. Please review the following plan we discussed and let me know if I can assist you in the future.   Referrals/Orders/Follow-Ups/Clinician Recommendations: none  This is a list of the screening recommended for you and due dates:  Health Maintenance  Topic Date Due   Eye exam for diabetics  12/10/2020   Flu Shot  12/31/2022   Complete foot exam   01/13/2023   COVID-19 Vaccine (3 - 2024-25 season) 06/30/2023*   Zoster (Shingles) Vaccine (1 of 2) 09/12/2023*   Hemoglobin A1C  06/20/2023   Yearly kidney health urinalysis for diabetes  12/18/2023   Yearly kidney function blood test for diabetes  01/22/2024   Medicare Annual Wellness Visit  06/13/2024   Cologuard (Stool DNA test)  07/25/2024   Pneumonia Vaccine  Completed   Hepatitis C Screening  Completed   HPV Vaccine  Aged Out   DTaP/Tdap/Td vaccine  Discontinued   Colon Cancer Screening  Discontinued  *Topic was postponed. The date shown is not the original due date.    Advanced directives: (Copy Requested) Please bring a copy of your health care power of attorney and living will to the office to be added to your chart at your convenience.  Next Medicare Annual Wellness Visit scheduled for next year: Yes  insert Preventive Care attachment Insert FALL PREVENTION attachment if needed

## 2023-06-14 NOTE — Progress Notes (Signed)
 Subjective:   Larry [name] is a 73 y.o. male who presents for Medicare Annual/Subsequent preventive examination.  Visit Complete: Virtual I connected with  Larry [name] on 06/14/23 by a audio enabled telemedicine application and verified that I am speaking with the correct person using two identifiers.  Interactive audio and video telecommunications were attempted between this provider and patient, however failed, due to patient having technical difficulties OR patient did not have access to video capability.  We continued and completed visit with audio only.  Patient Location: Home  Provider Location: Office/Clinic  I discussed the limitations of evaluation and management by telemedicine. The patient expressed understanding and agreed to proceed.  Vital Signs: Because this visit was a virtual/telehealth visit, some criteria may be missing or patient reported. Any vitals not documented were not able to be obtained and vitals that have been documented are patient reported.    Cardiac Risk Factors include: advanced age (>66men, >37 women);diabetes mellitus;dyslipidemia;hypertension;male gender     Objective:    Today's Vitals   There is no height or weight on file to calculate BMI.     06/14/2023    1:02 PM 06/12/2022   10:43 AM 02/15/2022    4:00 PM 04/30/2021   11:24 AM 11/27/2020    2:48 PM 04/09/2020   10:34 AM 09/24/2019    8:40 PM  Advanced Directives  Does Patient Have a Medical Advance Directive? Yes Yes Yes No No No No  Type of Estate Agent of [place];Living will Healthcare Power of [place];Living will Living will      Does patient want to make changes to medical advance directive?   No - Patient declined   Yes (MAU/Ambulatory/Procedural Areas - Information given)   Copy of Healthcare Power of Attorney in Chart? No - copy requested No - copy requested       Would patient like information on creating a medical advance directive?    No -  Patient declined No - Patient declined  No - Patient declined    Current Medications (verified) Outpatient Encounter Medications as of 06/14/2023  Medication Sig   ACCU-CHEK AVIVA PLUS test strip TEST FASTING BLOOD SUGAR ONCE  DAILY IN THE MORNING   Accu-Chek FastClix Lancets MISC    amLODipine -olmesartan  (AZOR ) 10-40 MG tablet Take 1 tablet by mouth daily.   atorvastatin  (LIPITOR) 20 MG tablet TAKE 1 TABLET BY MOUTH DAILY   baclofen  (LIORESAL ) 20 MG tablet Take 1 tablet (20 mg total) by mouth 3 (three) times daily.   Blood Glucose Calibration (ACCU-CHEK AVIVA) SOLN    ferrous sulfate  325 (65 FE) MG tablet Take 1 tablet (325 mg total) by mouth daily with breakfast.   finasteride  (PROSCAR ) 5 MG tablet TAKE 1 TABLET BY MOUTH DAILY   Incontinence Supply Disposable (DEPEND ADJUSTABLE UNDERWEAR LG) MISC Use as needed.   JARDIANCE  10 MG TABS tablet TAKE 1 TABLET BY MOUTH DAILY   Lancets Misc. (ACCU-CHEK FASTCLIX LANCET) KIT 1 Units by Does not apply route daily before breakfast.   metFORMIN  (GLUCOPHAGE -XR) 750 MG 24 hr tablet TAKE 1 TABLET BY MOUTH DAILY  WITH BREAKFAST   mirabegron  ER (MYRBETRIQ ) 50 MG TB24 tablet Take 1 tablet (50 mg total) by mouth daily.   Multiple Vitamin (MULTIVITAMIN) tablet Take 1 tablet by mouth daily.   pantoprazole  (PROTONIX ) 40 MG tablet TAKE 1 TABLET BY MOUTH TWICE  DAILY BEFORE MEALS   pregabalin  (LYRICA ) 150 MG capsule Take 1 capsule (150 mg total) by mouth 2 (two) times  daily.   traZODone  (DESYREL ) 50 MG tablet TAKE 1 TABLET BY MOUTH AT  BEDTIME   No facility-administered encounter medications on file as of 06/14/2023.    Allergies (verified) Nsaids and Rosuvastatin    History: Past Medical History:  Diagnosis Date   ABLA (acute blood loss anemia) 02/15/2022   Abnormality of gait    Acute delirium 12/11/2011   Acute encephalopathy 02/15/2022   Acute respiratory failure due to COVID-19 [facility]) 09/23/2019   Cognitive communication deficit    CTS (carpal  tunnel syndrome) 09/26/2015   Diabetes mellitus    GI bleed 02/15/2022   Hypercholesterolemia    Hypertension    Kidney stones    Leukocytopenia 05/22/2013   MS (multiple sclerosis) (HCC)    sees [facility]   T10 spinal cord injury (HCC) 12/12/2011   hx surgery 1996 with bilat gait abnormality since   Tachycardia    Past Surgical History:  Procedure Laterality Date   BACK SURGERY     BIOPSY  02/16/2022   Procedure: BIOPSY;  Surgeon: Eda Iha, MD;  Location: [facility] ENDOSCOPY;  Service: Gastroenterology;;   COLONOSCOPY     ESOPHAGOGASTRODUODENOSCOPY (EGD) WITH PROPOFOL  N/A 02/16/2022   Procedure: ESOPHAGOGASTRODUODENOSCOPY (EGD) WITH PROPOFOL ;  Surgeon: Eda Iha, MD;  Location: [facility] ENDOSCOPY;  Service: Gastroenterology;  Laterality: N/A;   LUMBAR LAMINECTOMY     Family History  Problem Relation Age of Onset   Hypertension Mother    Liver cancer Father    Hypertension Sister    Social History   Socioeconomic History   Marital status: Divorced    Spouse name: Not on file   Number of children: 1   Years of education: 13   Highest education level: Not on file  Occupational History   Occupation: Disability  Tobacco Use   Smoking status: Never   Smokeless tobacco: Never  Vaping Use   Vaping status: Never Used  Substance and Sexual Activity   Alcohol use: No   Drug use: No   Sexual activity: Not Currently  Other Topics Concern   Not on file  Social History Narrative   Work or School:none      Home Situation:lives alone, feels safe      Spiritual Beliefs: Christian      Lifestyle: works out with friend 3 days per week at the [employer]; diet is poor - likes to the pepsi but like unhealthy options      Right-handed.    2 cups caffeine per day.   Social Drivers of Corporate Investment Banker Strain: Low Risk  (06/14/2023)   Overall Financial Resource Strain (CARDIA)    Difficulty of Paying Living Expenses: Not hard at all  Food Insecurity: No Food Insecurity  (06/14/2023)   Hunger Vital Sign    Worried About Running Out of Food in the Last Year: Never true    Ran Out of Food in the Last Year: Never true  Transportation Needs: No Transportation Needs (06/14/2023)   PRAPARE - Administrator, Civil Service (Medical): No    Lack of Transportation (Non-Medical): No  Physical Activity: Insufficiently Active (06/14/2023)   Exercise Vital Sign    Days of Exercise per Week: 3 days    Minutes of Exercise per Session: 30 min  Stress: No Stress Concern Present (06/14/2023)   [employer] of Occupational Health - Occupational Stress Questionnaire    Feeling of Stress : Not at all  Social Connections: Moderately Isolated (06/14/2023)   Social Connection and Isolation Panel [  NHANES]    Frequency of Communication with Friends and Family: Three times a week    Frequency of Social Gatherings with Friends and Family: Once a week    Attends Religious Services: More than 4 times per year    Active Member of [employer] or Organizations: No    Attends Engineer, Structural: Never    Marital Status: Divorced    Tobacco Counseling Counseling given: Not Answered   Clinical Intake:  Pre-visit preparation completed: Yes  Pain : No/denies pain     Nutritional Risks: None Diabetes: Yes CBG done?: No Did pt. bring in CBG monitor from home?: No  How often do you need to have someone help you when you read instructions, pamphlets, or other written materials from your doctor or pharmacy?: 1 - Never  Interpreter Needed?: No  Information entered by :: NAllen LPN   Activities of Daily Living    06/14/2023   12:57 PM  In your present state of health, do you have any difficulty performing the following activities:  Hearing? 0  Vision? 0  Difficulty concentrating or making decisions? 0  Walking or climbing stairs? 1  Comment uses wheelchair  Dressing or bathing? 0  Doing errands, shopping? 1  Comment has someone with him  Preparing Food  and eating ? N  Using the Toilet? N  In the past six months, have you accidently leaked urine? Y  Do you have problems with loss of bowel control? N  Managing your Medications? N  Managing your Finances? N  Housekeeping or managing your Housekeeping? Y    Patient Care Team: Nche, Roselie [place]ford, NP as PCP - General (Internal Medicine) Colon Shove, MD as Consulting Physician (Neurosurgery) Colon Shove, MD as Consulting Physician (Neurosurgery) Sandria Selma LABOR, [facility] (Inactive) as Pharmacist (Pharmacist) Weyman Corning, RN as [facility] Care Management  Indicate any recent Medical Services you may have received from other than [facility] providers in the past year (date may be approximate).     Assessment:   This is a routine wellness examination for Lukas.  Hearing/Vision screen Hearing Screening - Comments:: Denies hearing issues Vision Screening - Comments:: No regular eye exams,   Goals Addressed             This Visit's Progress    Patient Stated       06/14/2023, wants to start physical therapy again       Depression Screen    06/14/2023    1:04 PM 06/12/2022   10:45 AM 04/30/2021   11:27 AM 04/30/2021   11:23 AM 11/19/2020    1:19 PM 07/17/2020    2:01 PM 04/09/2020   10:36 AM  PHQ 2/9 Scores  PHQ - 2 Score 0 0 0 0 0 0 0    Fall Risk    06/14/2023    1:02 PM 06/12/2022   10:44 AM 04/30/2021   11:24 AM 04/09/2020   10:35 AM 01/09/2020    9:15 PM  Fall Risk   Falls in the past year? 1 1 0 0 1  Comment transferring from couch to wheelchair with transferring     Number falls in past yr: 0 1 0 0 0  Injury with Fall? 0 0 0 0 0  Risk for fall due to : Impaired balance/gait;Impaired mobility;Medication side effect History of fall(s);Impaired balance/gait;Impaired mobility;Medication side effect  Impaired mobility History of fall(s);Impaired mobility;Impaired balance/gait  Follow up Falls prevention discussed;Falls evaluation completed Falls evaluation  completed;Education provided;Falls prevention discussed  Falls evaluation completed Falls prevention discussed Falls evaluation completed  Comment   wheelchair      MEDICARE RISK AT HOME: Medicare Risk at Home Any stairs in or around the home?: No If so, are there any without handrails?: No Home free of loose throw rugs in walkways, pet beds, electrical cords, etc?: Yes Adequate lighting in your home to reduce risk of falls?: Yes Life alert?: Yes Use of a cane, walker or w/c?: Yes Grab bars in the bathroom?: Yes Shower chair or bench in shower?: Yes Elevated toilet seat or a handicapped toilet?: Yes  TIMED UP AND GO:  Was the test performed?  No    Cognitive Function:        06/14/2023    1:04 PM 06/12/2022   10:45 AM  6CIT Screen  What Year? 0 points 0 points  What month? 0 points 0 points  What time? 0 points 0 points  Count back from 20 0 points 0 points  Months in reverse 2 points 2 points  Repeat phrase 0 points 0 points  Total Score 2 points 2 points    Immunizations Immunization History  Administered Date(s) Administered   Fluad Quad(high Dose 65+) 03/24/2019, 05/02/2020, 03/20/2022   Influenza Split 02/05/2012   Influenza, High Dose Seasonal PF 03/05/2014, 05/17/2015, 02/15/2017, 06/10/2018   Influenza,inj,Quad PF,6+ Mos 05/09/2013   PFIZER(Purple Top)SARS-COV-2 Vaccination 01/14/2020, 02/04/2020   PPD Test 01/05/2016   Pneumococcal Conjugate-13 03/05/2014   Pneumococcal Polysaccharide-23 05/17/2015    TDAP status: Up to date  Flu Vaccine status: Due, Education has been provided regarding the importance of this vaccine. Advised may receive this vaccine at local pharmacy or Health Dept. Aware to provide a copy of the vaccination record if obtained from local pharmacy or Health Dept. Verbalized acceptance and understanding.  Pneumococcal vaccine status: Up to date  Covid-19 vaccine status: Information provided on how to obtain vaccines.   Qualifies for  Shingles Vaccine? Yes   Zostavax completed No   Shingrix Completed?: No.    Education has been provided regarding the importance of this vaccine. Patient has been advised to call insurance company to determine out of pocket expense if they have not yet received this vaccine. Advised may also receive vaccine at local pharmacy or Health Dept. Verbalized acceptance and understanding.  Screening Tests Health Maintenance  Topic Date Due   OPHTHALMOLOGY EXAM  12/10/2020   INFLUENZA VACCINE  12/31/2022   FOOT EXAM  01/13/2023   COVID-19 Vaccine (3 - 2024-25 season) 06/30/2023 (Originally 01/31/2023)   Zoster Vaccines- Shingrix (1 of 2) 09/12/2023 (Originally 08/08/1998)   HEMOGLOBIN A1C  06/20/2023   Diabetic kidney evaluation - Urine ACR  12/18/2023   Diabetic kidney evaluation - eGFR measurement  01/22/2024   Medicare Annual Wellness (AWV)  06/13/2024   Fecal DNA (Cologuard)  07/25/2024   Pneumonia Vaccine 39+ Years old  Completed   Hepatitis C Screening  Completed   HPV VACCINES  Aged Out   DTaP/Tdap/Td  Discontinued   Colonoscopy  Discontinued    Health Maintenance  Health Maintenance Due  Topic Date Due   OPHTHALMOLOGY EXAM  12/10/2020   INFLUENZA VACCINE  12/31/2022   FOOT EXAM  01/13/2023    Colorectal cancer screening: Type of screening: Cologuard. Completed 07/25/2021. Repeat every 3 years  Lung Cancer Screening: (Low Dose CT Chest recommended if Age 54-80 years, 20 pack-year currently smoking OR have quit w/in 15years.) does not qualify.   Lung Cancer Screening Referral: no  Additional Screening:  Hepatitis C Screening: does qualify; Completed 05/22/2013  Vision Screening: Recommended annual ophthalmology exams for early detection of glaucoma and other disorders of the eye. Is the patient up to date with their annual eye exam?  No  Who is the provider or what is the name of the office in which the patient attends annual eye exams? [facility] If pt is not established  with a provider, would they like to be referred to a provider to establish care? No .   Dental Screening: Recommended annual dental exams for proper oral hygiene  Diabetic Foot Exam: Diabetic Foot Exam: Overdue, Pt has been advised about the importance in completing this exam. Pt is scheduled for diabetic foot exam on next appointment.  Community Resource Referral / Chronic Care Management: CRR required this visit?  No   CCM required this visit?  No     Plan:     I have personally reviewed and noted the following in the patient's chart:   Medical and social history Use of alcohol, tobacco or illicit drugs  Current medications and supplements including opioid prescriptions. Patient is not currently taking opioid prescriptions. Functional ability and status Nutritional status Physical activity Advanced directives List of other physicians Hospitalizations, surgeries, and ER visits in previous 12 months Vitals Screenings to include cognitive, depression, and falls Referrals and appointments  In addition, I have reviewed and discussed with patient certain preventive protocols, quality metrics, and best practice recommendations. A written personalized care plan for preventive services as well as general preventive health recommendations were provided to patient.     Ardella FORBES Dawn, LPN   [id number]   After Visit Summary: (MyChart) Due to this being a telephonic visit, the after visit summary with patients personalized plan was offered to patient via MyChart   Nurse Notes: none

## 2023-06-25 ENCOUNTER — Ambulatory Visit (INDEPENDENT_AMBULATORY_CARE_PROVIDER_SITE_OTHER): Payer: Medicare Other | Admitting: Nurse Practitioner

## 2023-06-25 ENCOUNTER — Encounter: Payer: Self-pay | Admitting: Nurse Practitioner

## 2023-06-25 VITALS — BP 142/78 | HR 72 | Temp 98.8°F | Ht 67.0 in | Wt 172.0 lb

## 2023-06-25 DIAGNOSIS — E1129 Type 2 diabetes mellitus with other diabetic kidney complication: Secondary | ICD-10-CM

## 2023-06-25 DIAGNOSIS — M48062 Spinal stenosis, lumbar region with neurogenic claudication: Secondary | ICD-10-CM | POA: Diagnosis not present

## 2023-06-25 DIAGNOSIS — R809 Proteinuria, unspecified: Secondary | ICD-10-CM

## 2023-06-25 DIAGNOSIS — E1142 Type 2 diabetes mellitus with diabetic polyneuropathy: Secondary | ICD-10-CM

## 2023-06-25 DIAGNOSIS — N3945 Continuous leakage: Secondary | ICD-10-CM

## 2023-06-25 DIAGNOSIS — G8220 Paraplegia, unspecified: Secondary | ICD-10-CM | POA: Diagnosis not present

## 2023-06-25 DIAGNOSIS — I10 Essential (primary) hypertension: Secondary | ICD-10-CM

## 2023-06-25 DIAGNOSIS — Z23 Encounter for immunization: Secondary | ICD-10-CM

## 2023-06-25 DIAGNOSIS — Z7984 Long term (current) use of oral hypoglycemic drugs: Secondary | ICD-10-CM

## 2023-06-25 DIAGNOSIS — N3281 Overactive bladder: Secondary | ICD-10-CM | POA: Diagnosis not present

## 2023-06-25 DIAGNOSIS — N401 Enlarged prostate with lower urinary tract symptoms: Secondary | ICD-10-CM

## 2023-06-25 DIAGNOSIS — E785 Hyperlipidemia, unspecified: Secondary | ICD-10-CM

## 2023-06-25 DIAGNOSIS — R8290 Unspecified abnormal findings in urine: Secondary | ICD-10-CM

## 2023-06-25 DIAGNOSIS — M4802 Spinal stenosis, cervical region: Secondary | ICD-10-CM | POA: Diagnosis not present

## 2023-06-25 DIAGNOSIS — E1169 Type 2 diabetes mellitus with other specified complication: Secondary | ICD-10-CM | POA: Diagnosis not present

## 2023-06-25 DIAGNOSIS — R350 Frequency of micturition: Secondary | ICD-10-CM

## 2023-06-25 DIAGNOSIS — G629 Polyneuropathy, unspecified: Secondary | ICD-10-CM

## 2023-06-25 LAB — COMPREHENSIVE METABOLIC PANEL
ALT: 13 U/L (ref 0–53)
AST: 21 U/L (ref 0–37)
Albumin: 4.7 g/dL (ref 3.5–5.2)
Alkaline Phosphatase: 106 U/L (ref 39–117)
BUN: 16 mg/dL (ref 6–23)
CO2: 30 meq/L (ref 19–32)
Calcium: 9.7 mg/dL (ref 8.4–10.5)
Chloride: 104 meq/L (ref 96–112)
Creatinine, Ser: 0.99 mg/dL (ref 0.40–1.50)
GFR: 74.85 mL/min (ref >60.00)
Glucose, Bld: 108 mg/dL — ABNORMAL HIGH (ref 70–99)
Potassium: 4.1 meq/L (ref 3.5–5.1)
Sodium: 142 meq/L (ref 135–145)
Total Bilirubin: 0.5 mg/dL (ref 0.2–1.2)
Total Protein: 7.4 g/dL (ref 6.0–8.3)

## 2023-06-25 LAB — LIPID PANEL
Cholesterol: 115 mg/dL (ref 0–200)
HDL: 37.9 mg/dL — ABNORMAL LOW (ref >39.00)
LDL Cholesterol: 48 mg/dL (ref 0–99)
NonHDL: 76.66
Total CHOL/HDL Ratio: 3
Triglycerides: 143 mg/dL (ref 0.0–149.0)
VLDL: 28.6 mg/dL (ref 0.0–40.0)

## 2023-06-25 LAB — HEMOGLOBIN A1C: Hgb A1c MFr Bld: 7 % — ABNORMAL HIGH (ref 4.6–6.5)

## 2023-06-25 NOTE — Patient Instructions (Signed)
Go to lab Maintain DASH diet Maintain current medications.  DASH Eating Plan DASH stands for Dietary Approaches to Stop Hypertension. The DASH eating plan is a healthy eating plan that has been shown to: Lower high blood pressure (hypertension). Reduce your risk for type 2 diabetes, heart disease, and stroke. Help with weight loss. What are tips for following this plan? Reading food labels Check food labels for the amount of salt (sodium) per serving. Choose foods with less than 5 percent of the Daily Value (DV) of sodium. In general, foods with less than 300 milligrams (mg) of sodium per serving fit into this eating plan. To find whole grains, look for the word "whole" as the first word in the ingredient list. Shopping Buy products labeled as "low-sodium" or "no salt added." Buy fresh foods. Avoid canned foods and pre-made or frozen meals. Cooking Try not to add salt when you cook. Use salt-free seasonings or herbs instead of table salt or sea salt. Check with your health care provider or pharmacist before using salt substitutes. Do not fry foods. Cook foods in healthy ways, such as baking, boiling, grilling, roasting, or broiling. Cook using oils that are good for your heart. These include olive, canola, avocado, soybean, and sunflower oil. Meal planning  Eat a balanced diet. This should include: 4 or more servings of fruits and 4 or more servings of vegetables each day. Try to fill half of your plate with fruits and vegetables. 6-8 servings of whole grains each day. 6 or less servings of lean meat, poultry, or fish each day. 1 oz is 1 serving. A 3 oz (85 g) serving of meat is about the same size as the palm of your hand. One egg is 1 oz (28 g). 2-3 servings of low-fat dairy each day. One serving is 1 cup (237 mL). 1 serving of nuts, seeds, or beans 5 times each week. 2-3 servings of heart-healthy fats. Healthy fats called omega-3 fatty acids are found in foods such as walnuts,  flaxseeds, fortified milks, and eggs. These fats are also found in cold-water fish, such as sardines, salmon, and mackerel. Limit how much you eat of: Canned or prepackaged foods. Food that is high in trans fat, such as fried foods. Food that is high in saturated fat, such as fatty meat. Desserts and other sweets, sugary drinks, and other foods with added sugar. Full-fat dairy products. Do not salt foods before eating. Do not eat more than 4 egg yolks a week. Try to eat at least 2 vegetarian meals a week. Eat more home-cooked food and less restaurant, buffet, and fast food. Lifestyle When eating at a restaurant, ask if your food can be made with less salt or no salt. If you drink alcohol: Limit how much you have to: 0-1 drink a day if you are male. 0-2 drinks a day if you are male. Know how much alcohol is in your drink. In the [place] one drink is one 12 oz bottle of beer (355 mL), one 5 oz glass of wine (148 mL), or one 1 oz glass of hard liquor (44 mL). General information Avoid eating more than 2,300 mg of salt a day. If you have hypertension, you may need to reduce your sodium intake to 1,500 mg a day. Work with your provider to stay at a healthy body weight or lose weight. Ask what the best weight range is for you. On most days of the week, get at least 30 minutes of exercise that causes your  heart to beat faster. This may include walking, swimming, or biking. Work with your provider or dietitian to adjust your eating plan to meet your specific calorie needs. What foods should I eat? Fruits All fresh, dried, or frozen fruit. Canned fruits that are in their natural juice and do not have sugar added to them. Vegetables Fresh or frozen vegetables that are raw, steamed, roasted, or grilled. Low-sodium or reduced-sodium tomato and vegetable juice. Low-sodium or reduced-sodium tomato sauce and tomato paste. Low-sodium or reduced-sodium canned vegetables. Grains Whole-grain or  whole-wheat bread. Whole-grain or whole-wheat pasta. Brown rice. Larry [name]. Bulgur. Whole-grain and low-sodium cereals. Pita bread. Low-fat, low-sodium crackers. Whole-wheat flour tortillas. Meats and other proteins Skinless chicken or Larry [name]. Ground chicken or Larry [name]. Pork with fat trimmed off. Fish and seafood. Egg whites. Dried beans, peas, or lentils. Unsalted nuts, nut butters, and seeds. Unsalted canned beans. Lean cuts of beef with fat trimmed off. Low-sodium, lean precooked or cured meat, such as sausages or meat loaves. Dairy Low-fat (1%) or fat-free (skim) milk. Reduced-fat, low-fat, or fat-free cheeses. Nonfat, low-sodium ricotta or cottage cheese. Low-fat or nonfat yogurt. Low-fat, low-sodium cheese. Fats and oils Soft margarine without trans fats. Vegetable oil. Reduced-fat, low-fat, or light mayonnaise and salad dressings (reduced-sodium). Canola, safflower, olive, avocado, soybean, and sunflower oils. Avocado. Seasonings and condiments Herbs. Spices. Seasoning mixes without salt. Other foods Unsalted popcorn and pretzels. Fat-free sweets. The items listed above may not be all the foods and drinks you can have. Talk to a dietitian to learn more. What foods should I avoid? Fruits Canned fruit in a light or heavy syrup. Fried fruit. Fruit in cream or butter sauce. Vegetables Creamed or fried vegetables. Vegetables in a cheese sauce. Regular canned vegetables that are not marked as low-sodium or reduced-sodium. Regular canned tomato sauce and paste that are not marked as low-sodium or reduced-sodium. Regular tomato and vegetable juices that are not marked as low-sodium or reduced-sodium. Larry [name]. Olives. Grains Baked goods made with fat, such as croissants, muffins, or some breads. Dry pasta or rice meal packs. Meats and other proteins Fatty cuts of meat. Ribs. Fried meat. Larry [name]. Bologna, salami, and other precooked or cured meats, such as sausages or meat loaves, that are not  lean and low in sodium. Fat from the back of a pig (fatback). Bratwurst. Salted nuts and seeds. Canned beans with added salt. Canned or smoked fish. Whole eggs or egg yolks. Chicken or Larry [name] with skin. Dairy Whole or 2% milk, cream, and half-and-half. Whole or full-fat cream cheese. Whole-fat or sweetened yogurt. Full-fat cheese. Nondairy creamers. Whipped toppings. Processed cheese and cheese spreads. Fats and oils Butter. Stick margarine. Lard. Shortening. Ghee. Bacon fat. Tropical oils, such as coconut, palm kernel, or palm oil. Seasonings and condiments Onion salt, garlic salt, seasoned salt, table salt, and sea salt. Worcestershire sauce. Tartar sauce. Barbecue sauce. Teriyaki sauce. Soy sauce, including reduced-sodium soy sauce. Steak sauce. Canned and packaged gravies. Fish sauce. Oyster sauce. Cocktail sauce. Store-bought horseradish. Ketchup. Mustard. Meat flavorings and tenderizers. Bouillon cubes. Hot sauces. Pre-made or packaged marinades. Pre-made or packaged taco seasonings. Relishes. Regular salad dressings. Other foods Salted popcorn and pretzels. The items listed above may not be all the foods and drinks you should avoid. Talk to a dietitian to learn more. Where to find more information National Heart, Lung, and Blood Institute (NHLBI): BuffaloDryCleaner.gl American Heart Association (AHA): heart.org Academy of Nutrition and Dietetics: eatright.org National Kidney Foundation (NKF): kidney.org This information is not intended to replace advice  given to you by your health care provider. Make sure you discuss any questions you have with your health care provider. Document Revised: 06/04/2022 Document Reviewed: 06/04/2022 Elsevier Patient Education  2024 [employer].

## 2023-06-25 NOTE — Assessment & Plan Note (Signed)
Advised about the importance of annual Dm eye exam  and to schedule appt for annual DM eye exam. No adverse effects with metformin and jardiance No glucose check at home BP not at goal due to diet non compliance Repeat hgbA1c, UA, urine microalbumin, and Cmp

## 2023-06-25 NOTE — Progress Notes (Unsigned)
Established Patient Visit  Patient: Larry [name]   DOB: 09/03/1950   73 y.o. Male  MRN: [id number] Visit Date: 06/29/2023  Subjective:    Chief Complaint  Patient presents with   Diabetes    Follow up, incontinece, request order for physical therapy   Accompanied by daughter  Hypertension BP not at goal due to diet non compliance LE edema due to diet non compliance, no ulcers, no cellulitis. No erythema, no PND BP Readings from Last 3 Encounters:  06/25/23 (!) 142/78  12/18/22 (!) 140/69  06/19/22 130/82    Maintain olmesartan, amlodipine dose Hydrochlorothiazide discontinued due to current use of jardiance Advised to maintain DASH diet and use of compression stocking Repeat CMP  DM (diabetes mellitus) (HCC) Advised about the importance of annual Dm eye exam  and need for appt.  No adverse effects with metformin 750mg  and jardiance 10mg  No glucose check at home BP not at goal due to diet non compliance Repeat hgbA1c-7.2% improving, UA-abnormal, urine culture sent, urine microalbumin-worsening, and Cmp-stable Maintain metformin dose Increase jardiance dose to 25mg  Repeat BMP in 70month  Hyperlipidemia associated with type 2 diabetes mellitus (HCC) Repeat lipid panel: LDL at goal Maintain atorvastatin dose  Benign prostatic hyperplasia with lower urinary tract symptoms Reports worsening urinary frequency and incontinence. No hematuria or dysuria or pevic/back pain or fever. Has BM 2x/week, soft formed stool Current use of mybertiq 25mg  and proscar with minimal relief PSA: 0.55-normal Check UA-abnormal, pending urine culture entered referral to urology  Paraplegia (HCC) Last home PT session 03/2023 x 6weeks Reports gradual decline in strength and increase difficulty with mobility in last 2months Wants to resume home PT and OT to help maintain mobility, ROM, and strength Order entered   Wt Readings from Last 3 Encounters:  06/25/23 172 lb (78 kg)   12/18/22 178 lb 11.2 oz (81.1 kg)  06/19/22 166 lb 3.2 oz (75.4 kg)    Reviewed medical, surgical, and social history today  Medications: Outpatient Medications Prior to Visit  Medication Sig   ACCU-CHEK AVIVA PLUS test strip TEST FASTING BLOOD SUGAR ONCE  DAILY IN THE MORNING   Accu-Chek FastClix Lancets MISC    amLODipine-olmesartan (AZOR) 10-40 MG tablet Take 1 tablet by mouth daily.   atorvastatin (LIPITOR) 20 MG tablet TAKE 1 TABLET BY MOUTH DAILY   baclofen (LIORESAL) 20 MG tablet Take 1 tablet (20 mg total) by mouth 3 (three) times daily.   Blood Glucose Calibration (ACCU-CHEK AVIVA) SOLN    ferrous sulfate 325 (65 FE) MG tablet Take 1 tablet (325 mg total) by mouth daily with breakfast.   finasteride (PROSCAR) 5 MG tablet TAKE 1 TABLET BY MOUTH DAILY   Incontinence Supply Disposable (DEPEND ADJUSTABLE UNDERWEAR LG) MISC Use as needed.   Lancets Misc. (ACCU-CHEK FASTCLIX LANCET) KIT 1 Units by Does not apply route daily before breakfast.   metFORMIN (GLUCOPHAGE-XR) 750 MG 24 hr tablet TAKE 1 TABLET BY MOUTH DAILY  WITH BREAKFAST   mirabegron ER (MYRBETRIQ) 50 MG TB24 tablet Take 1 tablet (50 mg total) by mouth daily.   Multiple Vitamin (MULTIVITAMIN) tablet Take 1 tablet by mouth daily.   pantoprazole (PROTONIX) 40 MG tablet TAKE 1 TABLET BY MOUTH TWICE  DAILY BEFORE MEALS   pregabalin (LYRICA) 150 MG capsule Take 1 capsule (150 mg total) by mouth 2 (two) times daily.   traZODone (DESYREL) 50 MG tablet TAKE 1 TABLET BY MOUTH AT  BEDTIME   [DISCONTINUED] JARDIANCE 10 MG TABS tablet TAKE 1 TABLET BY MOUTH DAILY   No facility-administered medications prior to visit.   Reviewed past medical and social history.   ROS per HPI above      Objective:  BP (!) 142/78 (BP Location: Left Arm, Patient Position: Sitting, Cuff Size: Normal)   Pulse 72   Temp 98.8 F (37.1 C)   Ht 5\' 7"  (1.702 m)   Wt 172 lb (78 kg)   SpO2 95%   BMI 26.94 kg/m      Physical Exam Vitals and  nursing note reviewed.  Cardiovascular:     Rate and Rhythm: Normal rate and regular rhythm.     Pulses: Normal pulses.     Heart sounds: Normal heart sounds.  Pulmonary:     Effort: Pulmonary effort is normal.     Breath sounds: Normal breath sounds.  Musculoskeletal:     Right lower leg: Edema present.     Left lower leg: Edema present.  Skin:    Findings: No erythema.  Neurological:     Mental Status: He is alert and oriented to person, place, and time.     Results for orders placed or performed in visit on 06/25/23  Urine Microalbumin w/creat. ratio  Result Value Ref Range   Creatinine, Urine 56 20 - 320 mg/dL   Microalb, Ur 16.1 mg/dL   Microalb Creat Ratio 255 (H) <30 mg/g creat  Urinalysis with Culture Reflex   Specimen: Urine  Result Value Ref Range   Color, Urine YELLOW YELLOW   APPearance CLEAR CLEAR   Specific Gravity, Urine 1.019 1.001 - 1.035   pH 6.5 5.0 - 8.0   Glucose, UA 3+ (A) NEGATIVE   Bilirubin Urine NEGATIVE NEGATIVE   Ketones, ur NEGATIVE NEGATIVE   Hgb urine dipstick TRACE (A) NEGATIVE   Protein, ur 1+ (A) NEGATIVE   Nitrites, Initial POSITIVE (A) NEGATIVE   Leukocyte Esterase 2+ (A) NEGATIVE   WBC, UA > OR = 60 (A) 0 - 5 /HPF   RBC / HPF NONE SEEN 0 - 2 /HPF   Squamous Epithelial / HPF NONE SEEN < OR = 5 /HPF   Bacteria, UA MODERATE (A) NONE SEEN /HPF   Hyaline Cast 0-5 (A) NONE SEEN /LPF   Note    Comprehensive metabolic panel  Result Value Ref Range   Sodium 142 135 - 145 mEq/L   Potassium 4.1 3.5 - 5.1 mEq/L   Chloride 104 96 - 112 mEq/L   CO2 30 19 - 32 mEq/L   Glucose, Bld 108 (H) 70 - 99 mg/dL   BUN 16 6 - 23 mg/dL   Creatinine, Ser 0.96 0.40 - 1.50 mg/dL   Total Bilirubin 0.5 0.2 - 1.2 mg/dL   Alkaline Phosphatase 106 39 - 117 U/L   AST 21 0 - 37 U/L   ALT 13 0 - 53 U/L   Total Protein 7.4 6.0 - 8.3 g/dL   Albumin 4.7 3.5 - 5.2 g/dL   GFR 04.54 >09.81 mL/min   Calcium 9.7 8.4 - 10.5 mg/dL  Hemoglobin X9J  Result Value Ref  Range   Hgb A1c MFr Bld 7.0 (H) 4.6 - 6.5 %  Lipid panel  Result Value Ref Range   Cholesterol 115 0 - 200 mg/dL   Triglycerides 478.2 0.0 - 149.0 mg/dL   HDL 95.62 (L) >13.08 mg/dL   VLDL 65.7 0.0 - 84.6 mg/dL   LDL Cholesterol 48 0 - 99 mg/dL  Total CHOL/HDL Ratio 3    NonHDL 76.66   REFLEXIVE URINE CULTURE  Result Value Ref Range   REFLEXIVE URINE CULTURE        Assessment & Plan:    Problem List Items Addressed This Visit     Benign prostatic hyperplasia with lower urinary tract symptoms   Reports worsening urinary frequency and incontinence. No hematuria or dysuria or pevic/back pain or fever. Has BM 2x/week, soft formed stool Current use of mybertiq 25mg  and proscar with minimal relief PSA: 0.55-normal Check UA-abnormal, pending urine culture entered referral to urology      Relevant Orders   Ambulatory referral to Urology   Urine Culture   REFLEXIVE URINE CULTURE (Completed)   DM (diabetes mellitus) (HCC) - Primary   Advised about the importance of annual Dm eye exam  and need for appt.  No adverse effects with metformin 750mg  and jardiance 10mg  No glucose check at home BP not at goal due to diet non compliance Repeat hgbA1c-7.2% improving, UA-abnormal, urine culture sent, urine microalbumin-worsening, and Cmp-stable Maintain metformin dose Increase jardiance dose to 25mg  Repeat BMP in 62month      Relevant Medications   empagliflozin (JARDIANCE) 25 MG TABS tablet   Other Relevant Orders   Comprehensive metabolic panel (Completed)   Hemoglobin A1c (Completed)   Renal Function Panel   Hyperlipidemia associated with type 2 diabetes mellitus (HCC)   Repeat lipid panel: LDL at goal Maintain atorvastatin dose      Relevant Medications   empagliflozin (JARDIANCE) 25 MG TABS tablet   Other Relevant Orders   Comprehensive metabolic panel (Completed)   Lipid panel (Completed)   Hypertension   BP not at goal due to diet non compliance LE edema due to diet  non compliance, no ulcers, no cellulitis. No erythema, no PND BP Readings from Last 3 Encounters:  06/25/23 (!) 142/78  12/18/22 (!) 140/69  06/19/22 130/82    Maintain olmesartan, amlodipine dose Hydrochlorothiazide discontinued due to current use of jardiance Advised to maintain DASH diet and use of compression stocking Repeat CMP      Microalbuminuria due to type 2 diabetes mellitus (HCC)   Relevant Medications   empagliflozin (JARDIANCE) 25 MG TABS tablet   Overactive bladder   Relevant Orders   Ambulatory referral to Urology   Urine Culture   REFLEXIVE URINE CULTURE (Completed)   Paraplegia (HCC)   Last home PT session 03/2023 x 6weeks Reports gradual decline in strength and increase difficulty with mobility in last 2months Wants to resume home PT and OT to help maintain mobility, ROM, and strength Order entered      Relevant Orders   Ambulatory referral to Physical Therapy   Peripheral neuropathy   Relevant Orders   Ambulatory referral to Physical Therapy   Spinal stenosis in cervical region   Relevant Orders   Ambulatory referral to Physical Therapy   Other Visit Diagnoses       Long term (current) use of oral hypoglycemic drugs         Spinal stenosis, lumbar region, with neurogenic claudication       Relevant Orders   Ambulatory referral to Physical Therapy     Immunization due       Relevant Orders   Flu Vaccine Trivalent High Dose (Fluad) (Completed)      Return in about 4 weeks (around 07/23/2023) for HTN.     Alysia Penna, NP

## 2023-06-25 NOTE — Assessment & Plan Note (Signed)
BP not at goal due to diet non compliance LE edema due to diet non compliance, no ulcers, no cellulitis. No erythema, no PND BP Readings from Last 3 Encounters:  06/25/23 (!) 142/78  12/18/22 (!) 140/69  06/19/22 130/82    Maintain olmesartan, amlodipine dose Hydrochlorothiazide discontinued due to current use of jardiance Advised to maintain DASH diet and use of compression stocking Repeat CMP

## 2023-06-29 ENCOUNTER — Encounter: Payer: Self-pay | Admitting: Nurse Practitioner

## 2023-06-29 DIAGNOSIS — E1129 Type 2 diabetes mellitus with other diabetic kidney complication: Secondary | ICD-10-CM | POA: Insufficient documentation

## 2023-06-29 MED ORDER — EMPAGLIFLOZIN 25 MG PO TABS: 25.0000 mg | ORAL_TABLET | Freq: Every day | ORAL | 1 refills | Status: DC

## 2023-06-29 NOTE — Assessment & Plan Note (Signed)
Last home PT session 03/2023 x 6weeks Reports gradual decline in strength and increase difficulty with mobility in last 2months Wants to resume home PT and OT to help maintain mobility, ROM, and strength Order entered

## 2023-06-29 NOTE — Assessment & Plan Note (Signed)
Reports worsening urinary frequency and incontinence. No hematuria or dysuria or pevic/back pain or fever. Has BM 2x/week, soft formed stool Current use of mybertiq 25mg  and proscar with minimal relief PSA: 0.55-normal Check UA-abnormal, pending urine culture entered referral to urology

## 2023-06-29 NOTE — Assessment & Plan Note (Signed)
Repeat lipid panel: LDL at goal ?Maintain atorvastatin dose ?

## 2023-06-30 LAB — URINALYSIS W MICROSCOPIC + REFLEX CULTURE
Bilirubin Urine: NEGATIVE
Ketones, ur: NEGATIVE
Nitrites, Initial: POSITIVE — AB
RBC / HPF: NONE SEEN /HPF (ref 0–2)
Specific Gravity, Urine: 1.019 (ref 1.001–1.035)
Squamous Epithelial / HPF: NONE SEEN /HPF (ref <=5)
WBC, UA: >=60 /[HPF] — AB (ref 0–5)
pH: 6.5 (ref 5.0–8.0)

## 2023-06-30 LAB — MICROALBUMIN / CREATININE URINE RATIO
Creatinine, Urine: 56 mg/dL (ref 20–320)
Microalb Creat Ratio: 255 mg/g{creat} — ABNORMAL HIGH (ref <30)
Microalb, Ur: 14.3 mg/dL

## 2023-06-30 LAB — URINE CULTURE

## 2023-06-30 LAB — CULTURE INDICATED

## 2023-07-02 NOTE — Addendum Note (Signed)
Addended by: Michaela Corner on: 07/02/2023 03:24 PM   Modules accepted: Orders

## 2023-07-13 ENCOUNTER — Other Ambulatory Visit: Payer: Self-pay | Admitting: Nurse Practitioner

## 2023-07-15 ENCOUNTER — Telehealth: Payer: Self-pay

## 2023-07-15 DIAGNOSIS — G8220 Paraplegia, unspecified: Secondary | ICD-10-CM

## 2023-07-15 DIAGNOSIS — M4802 Spinal stenosis, cervical region: Secondary | ICD-10-CM

## 2023-07-15 DIAGNOSIS — M48061 Spinal stenosis, lumbar region without neurogenic claudication: Secondary | ICD-10-CM

## 2023-07-15 DIAGNOSIS — M48062 Spinal stenosis, lumbar region with neurogenic claudication: Secondary | ICD-10-CM

## 2023-07-15 NOTE — Addendum Note (Signed)
Addended by: Michaela Corner on: 07/15/2023 03:03 PM   Modules accepted: Orders

## 2023-07-15 NOTE — Telephone Encounter (Signed)
Copied from CRM [id number]. Topic: General - Other >> Jul 15, 2023 12:38 PM Suzette B wrote: Reason for CRM: Patient stated that he has physical therapy but he is unable to make it to the sessions, he stated when he requested it he needed to be in  his home. Please call patient at [phone]  Forwarding message above. Spoke with physical therapy (outpatient) and they currently don't perform in-home sessions; patient will need to go to the office. A Home Health order will need to placed; referral was cancelled due to 2 missed phone calls and conflict with patient needs.

## 2023-07-15 NOTE — Telephone Encounter (Signed)
Called patient to inform of message below; no answer, left brief VM.

## 2023-07-25 ENCOUNTER — Other Ambulatory Visit: Payer: Self-pay | Admitting: Nurse Practitioner

## 2023-07-25 DIAGNOSIS — E118 Type 2 diabetes mellitus with unspecified complications: Secondary | ICD-10-CM

## 2023-07-30 ENCOUNTER — Encounter: Payer: Self-pay | Admitting: Nurse Practitioner

## 2023-07-30 ENCOUNTER — Ambulatory Visit: Payer: Medicare Other | Admitting: Urology

## 2023-07-30 ENCOUNTER — Encounter: Payer: Self-pay | Admitting: Urology

## 2023-07-30 ENCOUNTER — Other Ambulatory Visit: Payer: Medicare Other

## 2023-07-30 ENCOUNTER — Ambulatory Visit (INDEPENDENT_AMBULATORY_CARE_PROVIDER_SITE_OTHER): Payer: Medicare Other | Admitting: Nurse Practitioner

## 2023-07-30 VITALS — BP 148/62 | HR 78 | Ht 67.0 in | Wt 178.0 lb

## 2023-07-30 VITALS — BP 134/78 | HR 86 | Temp 97.4°F | Wt 169.0 lb

## 2023-07-30 DIAGNOSIS — N138 Other obstructive and reflux uropathy: Secondary | ICD-10-CM | POA: Diagnosis not present

## 2023-07-30 DIAGNOSIS — E1169 Type 2 diabetes mellitus with other specified complication: Secondary | ICD-10-CM | POA: Diagnosis not present

## 2023-07-30 DIAGNOSIS — N3942 Incontinence without sensory awareness: Secondary | ICD-10-CM

## 2023-07-30 DIAGNOSIS — I10 Essential (primary) hypertension: Secondary | ICD-10-CM

## 2023-07-30 DIAGNOSIS — R8290 Unspecified abnormal findings in urine: Secondary | ICD-10-CM | POA: Diagnosis not present

## 2023-07-30 DIAGNOSIS — N401 Enlarged prostate with lower urinary tract symptoms: Secondary | ICD-10-CM | POA: Diagnosis not present

## 2023-07-30 LAB — RENAL FUNCTION PANEL
Albumin: 4.9 g/dL (ref 3.5–5.2)
BUN: 29 mg/dL — ABNORMAL HIGH (ref 6–23)
CO2: 32 meq/L (ref 19–32)
Calcium: 10.1 mg/dL (ref 8.4–10.5)
Chloride: 101 meq/L (ref 96–112)
Creatinine, Ser: 1.12 mg/dL (ref 0.40–1.50)
GFR: 64.5 mL/min (ref >60.00)
Glucose, Bld: 152 mg/dL — ABNORMAL HIGH (ref 70–99)
Phosphorus: 3.5 mg/dL (ref 2.3–4.6)
Potassium: 3.8 meq/L (ref 3.5–5.1)
Sodium: 143 meq/L (ref 135–145)

## 2023-07-30 LAB — URINALYSIS
Bilirubin, UA: NEGATIVE
Ketones, UA: NEGATIVE
Leukocytes,UA: NEGATIVE
Nitrite, UA: POSITIVE — AB
RBC, UA: NEGATIVE
Specific Gravity, UA: 1.02 (ref 1.005–1.030)
Urobilinogen, Ur: 1 mg/dL (ref 0.2–1.0)
pH, UA: 7 (ref 5.0–7.5)

## 2023-07-30 LAB — BLADDER SCAN AMB NON-IMAGING

## 2023-07-30 MED ORDER — GEMTESA 75 MG PO TABS: 75.0000 mg | ORAL_TABLET | Freq: Every day | ORAL | Status: DC

## 2023-07-30 NOTE — Assessment & Plan Note (Signed)
 Improved BP and at goal with DASH diet and med compliance BP Readings from Last 3 Encounters:  07/30/23 134/78  07/30/23 (!) 148/62  06/25/23 (!) 142/78    Maintain med dose Encouraged to maintain DASH diet and use compression stocking for LE edema Repeat BMP due to change in jardiance dose

## 2023-07-30 NOTE — Progress Notes (Signed)
 Established Patient Visit  Patient: Larry [name]   DOB: 22-Feb-1951   73 y.o. Male  MRN: [id number] Visit Date: 07/30/2023  Subjective:    Chief Complaint  Patient presents with   Follow-up    1 month f/u    HPI Hypertension Improved BP and at goal with DASH diet and med compliance BP Readings from Last 3 Encounters:  07/30/23 134/78  07/30/23 (!) 148/62  06/25/23 (!) 142/78    Maintain med dose Encouraged to maintain DASH diet and use compression stocking for LE edema Repeat BMP due to change in jardiance dose  Wt Readings from Last 3 Encounters:  07/30/23 169 lb (76.7 kg)  07/30/23 178 lb (80.7 kg)  06/25/23 172 lb (78 kg)    Reviewed medical, surgical, and social history today  Medications: Outpatient Medications Prior to Visit  Medication Sig   ACCU-CHEK AVIVA PLUS test strip TEST FASTING BLOOD SUGAR ONCE  DAILY IN THE MORNING   Accu-Chek FastClix Lancets MISC    amLODipine-olmesartan (AZOR) 10-40 MG tablet Take 1 tablet by mouth daily.   atorvastatin (LIPITOR) 20 MG tablet TAKE 1 TABLET BY MOUTH DAILY   baclofen (LIORESAL) 20 MG tablet Take 1 tablet (20 mg total) by mouth 3 (three) times daily.   Blood Glucose Calibration (ACCU-CHEK AVIVA) SOLN    empagliflozin (JARDIANCE) 25 MG TABS tablet Take 1 tablet (25 mg total) by mouth daily.   ferrous sulfate 325 (65 FE) MG tablet Take 1 tablet (325 mg total) by mouth daily with breakfast.   finasteride (PROSCAR) 5 MG tablet TAKE 1 TABLET BY MOUTH DAILY   Incontinence Supply Disposable (DEPEND ADJUSTABLE UNDERWEAR LG) MISC Use as needed.   Lancets Misc. (ACCU-CHEK FASTCLIX LANCET) KIT 1 Units by Does not apply route daily before breakfast.   metFORMIN (GLUCOPHAGE-XR) 750 MG 24 hr tablet TAKE 1 TABLET BY MOUTH DAILY  WITH BREAKFAST   Multiple Vitamin (MULTIVITAMIN) tablet Take 1 tablet by mouth daily.   pantoprazole (PROTONIX) 40 MG tablet TAKE 1 TABLET BY MOUTH TWICE  DAILY BEFORE MEALS   pregabalin  (LYRICA) 150 MG capsule TAKE 1 CAPSULE BY MOUTH TWICE  DAILY   traZODone (DESYREL) 50 MG tablet TAKE 1 TABLET BY MOUTH AT  BEDTIME   Vibegron (GEMTESA) 75 MG TABS Take 1 tablet (75 mg total) by mouth daily.   No facility-administered medications prior to visit.   Reviewed past medical and social history.   ROS per HPI above      Objective:  BP 134/78 (BP Location: Left Arm, Patient Position: Sitting, Cuff Size: Normal)   Pulse 86   Temp (!) 97.4 F (36.3 C) (Temporal)   Wt 169 lb (76.7 kg)   SpO2 96%   BMI 26.47 kg/m      Physical Exam Vitals reviewed.  Cardiovascular:     Rate and Rhythm: Normal rate and regular rhythm.     Pulses: Normal pulses.     Heart sounds: Normal heart sounds.  Pulmonary:     Effort: Pulmonary effort is normal.  Musculoskeletal:     Right lower leg: Edema present.     Left lower leg: Edema present.  Neurological:     Mental Status: He is alert.     Results for orders placed or performed in visit on 07/30/23  Urinalysis  Result Value Ref Range   Specific Gravity, UA 1.020 1.005 - 1.030   pH, UA 7.0 5.0 -  7.5   Color, UA Yellow Yellow   Appearance Ur Clear Clear   Leukocytes,UA Negative Negative   Protein,UA 1+ (A) Negative/Trace   Glucose, UA 3+ (A) Negative   Ketones, UA Negative Negative   RBC, UA Negative Negative   Bilirubin, UA Negative Negative   Urobilinogen, Ur 1.0 0.2 - 1.0 mg/dL   Nitrite, UA Positive (A) Negative  BLADDER SCAN AMB NON-IMAGING  Result Value Ref Range   Scan Result       Assessment & Plan:    Problem List Items Addressed This Visit     DM (diabetes mellitus) (HCC)   Relevant Orders   Renal Function Panel   Microalbumin / creatinine urine ratio   Hypertension - Primary   Improved BP and at goal with DASH diet and med compliance BP Readings from Last 3 Encounters:  07/30/23 134/78  07/30/23 (!) 148/62  06/25/23 (!) 142/78    Maintain med dose Encouraged to maintain DASH diet and use  compression stocking for LE edema Repeat BMP due to change in jardiance dose      Relevant Orders   Renal Function Panel   Return in about 5 months (around 12/27/2023) for HTN, DM, hyperlipidemia (fasting).     Alysia Penna, NP

## 2023-07-30 NOTE — Patient Instructions (Addendum)
 Go to lab for blood draw Return urine sample to lab once collection Maintain med doses and DASH diet

## 2023-07-30 NOTE — Progress Notes (Signed)
 Assessment: 1. BPH with obstruction/lower urinary tract symptoms   2. Urinary incontinence without sensory awareness   3. Abnormal urine findings     Plan: I personally reviewed the patient's chart including provider notes, and lab results. I reviewed the patient records from [facility]. Urine culture sent today. Trial of Gemtesa 75 mg daily in place of Myrbetriq. Continue finasteride Will need to consider further evaluation with UDS if no improvement  Return to office in 1 month   Chief Complaint:  Chief Complaint  Patient presents with   Urinary Incontinence    History of Present Illness:  Larry [name] is a 73 y.o. male who is seen in consultation from Nche, Bonna Gains, NP for evaluation of BPH with LUTS and urinary incontinence. He has partial paraplegia, MS, diabetes, and congestive heart failure. He has a long history of lower urinary tract symptoms.  He previously has had urinary retention requiring Foley catheter placement and alpha-blocker therapy.  More recently, he has experienced urinary incontinence.  He currently has urinary frequency, nocturia, nighttime incontinence, weak stream.  No dysuria or gross hematuria.  He is currently on finasteride and Myrbetriq 50 mg nightly.  He reports significant incontinence at night without awareness.  He is requiring adult depends for management of his incontinence.  He does not have problems during the day.  No fecal incontinence.  He has a bowel movement 2-3 times per week. Urinalysis from 06/25/2023 showed >60 WBCs, moderate bacteria, nitrite positive.  No urine culture result obtained. No history of recurrent UTIs. IPSS = 12   PSA a from 7/24: 0.55  He has previously been evaluated by Dr. Berneice Heinrich at [facility].  His last visit there was in November 2021.  He has a history of urinary retention in July 2017 after lumbar spine surgery.  He subsequently passed a voiding trial and was placed on medical therapy with  finasteride and Myrbetriq 25 mg daily.  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   hx surgery 1996 with bilat gait abnormality since   Tachycardia     Past Surgical History:  Past Surgical History:  Procedure Laterality Date   BACK SURGERY     BIOPSY  02/16/2022   Procedure: BIOPSY;  Surgeon: Tressia Danas, MD;  Location: [facility] ENDOSCOPY;  Service: Gastroenterology;;   COLONOSCOPY     ESOPHAGOGASTRODUODENOSCOPY (EGD) WITH PROPOFOL N/A 02/16/2022   Procedure: ESOPHAGOGASTRODUODENOSCOPY (EGD) WITH PROPOFOL;  Surgeon: Tressia Danas, MD;  Location: [facility] ENDOSCOPY;  Service: Gastroenterology;  Laterality: N/A;   LUMBAR LAMINECTOMY      Allergies:  Allergies  Allergen Reactions   Nsaids Other (See Comments)    Hx of GI bleed   Rosuvastatin Other (See Comments)    Leg swelling    Family History:  Family History  Problem Relation Age of Onset   Hypertension Mother    Liver cancer Father    Hypertension Sister     Social  History:  Social History   Tobacco Use   Smoking status: Never   Smokeless tobacco: Never  Vaping Use   Vaping status: Never Used  Substance Use Topics   Alcohol use: No   Drug use: No    Review of symptoms:  Constitutional:  Negative for unexplained weight loss, night sweats, fever, chills ENT:  Negative for nose bleeds, sinus pain, painful swallowing CV:  Negative for chest pain, shortness of breath, exercise intolerance,  palpitations, loss of consciousness Resp:  Negative for cough, wheezing, shortness of breath GI:  Negative for nausea, vomiting, diarrhea, bloody stools GU:  Positives noted in HPI; otherwise negative for gross hematuria, dysuria Neuro:  Negative for seizures, slurred speech Psych:  Negative for lack of energy, depression, anxiety Endocrine:  Negative for polydipsia, polyuria, symptoms of hypoglycemia (dizziness, hunger, sweating) Hematologic:  Negative for anemia, purpura, petechia, prolonged or excessive bleeding, use of anticoagulants  Allergic:  Negative for difficulty breathing or choking as a result of exposure to anything; no shellfish allergy; no allergic response (rash/itch) to materials, foods  Physical exam: BP (!) 148/62   Pulse 78   Ht 5\' 7"  (1.702 m)   Wt 178 lb (80.7 kg)   BMI 27.88 kg/m  GENERAL APPEARANCE:  Well appearing, well developed, well nourished, NAD HEENT: Atraumatic, Normocephalic, oropharynx clear. NECK: Supple without lymphadenopathy or thyromegaly. LUNGS: Clear to auscultation bilaterally. HEART: Regular Rate and Rhythm without murmurs, gallops, or rubs. ABDOMEN: Soft, non-tender, No Masses. EXTREMITIES: LE paraplegia;  Without clubbing, cyanosis, or edema. NEUROLOGIC:  Alert and oriented x 3, in wheelchair, CN II-XII grossly intact.  MENTAL STATUS:  Appropriate. BACK:  Non-tender to palpation.  No CVAT SKIN:  Warm, dry and intact.    Results: U/A:  3+ glucose, nitrite +  PVR:  164 ml

## 2023-07-31 LAB — MICROALBUMIN / CREATININE URINE RATIO
Creatinine, Urine: 64 mg/dL (ref 20–320)
Microalb Creat Ratio: 166 mg/g{creat} — ABNORMAL HIGH (ref <30)
Microalb, Ur: 10.6 mg/dL

## 2023-08-02 LAB — URINE CULTURE

## 2023-08-02 MED ORDER — LEVOFLOXACIN 500 MG PO TABS: 500.0000 mg | ORAL_TABLET | Freq: Every day | ORAL | 0 refills | Status: DC

## 2023-08-02 NOTE — Addendum Note (Signed)
 Addended by: Milderd Meager on: 08/02/2023 02:21 PM   Modules accepted: Orders

## 2023-08-05 ENCOUNTER — Telehealth: Payer: Self-pay | Admitting: Nurse Practitioner

## 2023-08-05 NOTE — Telephone Encounter (Signed)
 Copied from CRM [id number]. Topic: General - Other >> Aug 05, 2023  1:38 PM Eunice Blase wrote: Reason for CRM:Pt called stated PT orders were faxed to office for approval [facility]. Ph: [phone]. Please call for verbal orders.

## 2023-08-05 NOTE — Telephone Encounter (Signed)
 Called patient and informed him of the information given to me by Chava from [facility] about a new referral would be needed since his last services were in October of 2024. Also informed him that I will sending the same information to [facility] to review. He verbalized understanding and thanked me for calling.

## 2023-08-05 NOTE — Telephone Encounter (Signed)
 Called [facility] and spoke with [facility] about the verbal orders needed per patient's call to the office. She stated that patient is not active with their office patient was discharged on 03/05/23 and a new referral would be needed for PT services. I thanked her for taking my call and giving the needed information.

## 2023-08-06 NOTE — Telephone Encounter (Signed)
 Faxed home health order to [facility]

## 2023-08-11 ENCOUNTER — Other Ambulatory Visit: Payer: Self-pay | Admitting: Nurse Practitioner

## 2023-08-11 DIAGNOSIS — N3942 Incontinence without sensory awareness: Secondary | ICD-10-CM

## 2023-08-11 NOTE — Telephone Encounter (Signed)
 Copied from CRM [id number]. Topic: Clinical - Medication Refill >> Aug 11, 2023  2:13 PM Truddie Crumble wrote: Most Recent Primary Care Visit:  Provider: Anne Ng  Department: [facility]  Visit Type: OFFICE VISIT  Date: 07/30/2023  Medication: Vibegron (GEMTESA) 75 MG TABS  Has the patient contacted their pharmacy? No (Agent: If no, request that the patient contact the pharmacy for the refill. If patient does not wish to contact the pharmacy document the reason why and proceed with request.) (Agent: If yes, when and what did the pharmacy advise?)  Is this the correct pharmacy for this prescription? Yes If no, delete pharmacy and type the correct one.  This is the patient's preferred pharmacy:  [facility]) - [place], [place] - [address] [facility] [address] [place] [address] [place] [address] Phone: [phone] Fax: [phone]  Has the prescription been filled recently? No  Is the patient out of the medication? Yes  Has the patient been seen for an appointment in the last year OR does the patient have an upcoming appointment? Yes  Can we respond through MyChart? Yes  Agent: Please be advised that Rx refills may take up to 3 business days. We ask that you follow-up with your pharmacy.

## 2023-08-12 ENCOUNTER — Telehealth: Payer: Self-pay

## 2023-08-12 NOTE — Telephone Encounter (Signed)
 Duplicate request. Previous request sent to Alysia Penna, NP.

## 2023-08-12 NOTE — Telephone Encounter (Signed)
 Copied from CRM [id number]. Topic: Referral - Status >> Aug 11, 2023  2:16 PM Truddie Crumble wrote: Reason for CRM: patient called stating he is checking on a [facility] referral because they have not heard from the office

## 2023-08-12 NOTE — Telephone Encounter (Signed)
 Requesting: Vibegron (GEMTESA) 75 MG TABS  Last Visit: 07/30/2023 Next Visit: 12/31/2023 Last Refill: 07/30/2023 by Dr. Di Kindle  Please Advise

## 2023-08-12 NOTE — Telephone Encounter (Signed)
 Forwarding message below

## 2023-08-13 MED ORDER — GEMTESA 75 MG PO TABS
75.0000 mg | ORAL_TABLET | Freq: Every day | ORAL | 3 refills | Status: AC
Start: 2023-08-13 — End: ?

## 2023-08-16 NOTE — Telephone Encounter (Signed)
 Called [facility] to follow up on the home health referral that was faxed on 08/06/23. I spoke with Darnelle and she stated that their office has not received anything on this patient and that patient was discharge back in October of 2024. I asked for a good fax number to so that I can fax the order. She asked that I also fax along with the referral a office note from within 90 days of referral request, patient demographics. I thanked her for taking my call.

## 2023-08-16 NOTE — Telephone Encounter (Signed)
 Late entry:  Called patient on Friday 08/13/23 and informed him that the referral was faxed on 08/06/23 and that I will give the office a call to check to make sure the fax was received. He thanked me for calling.

## 2023-08-18 ENCOUNTER — Other Ambulatory Visit: Payer: Self-pay | Admitting: Nurse Practitioner

## 2023-08-18 DIAGNOSIS — M4802 Spinal stenosis, cervical region: Secondary | ICD-10-CM

## 2023-08-18 DIAGNOSIS — M48062 Spinal stenosis, lumbar region with neurogenic claudication: Secondary | ICD-10-CM

## 2023-08-18 DIAGNOSIS — G8220 Paraplegia, unspecified: Secondary | ICD-10-CM

## 2023-08-19 NOTE — Telephone Encounter (Signed)
 Copied from CRM [id number]. Topic: Referral - Status >> Aug 18, 2023 12:47 PM Kathryne Eriksson wrote: Reason for CRM: Referral Status >> Aug 18, 2023 12:48 PM Kathryne Eriksson wrote: Patient called in stating that he spoke with [facility] and they're still claiming to have NOT received any fax from the office.

## 2023-08-19 NOTE — Telephone Encounter (Signed)
 Spoke with [facility] regarding referral order. Order was re faxed on 08/19/2023 with confirmation; contact information was also given to referral coordinator; she will call back to confirm once order is received.

## 2023-08-21 ENCOUNTER — Other Ambulatory Visit: Payer: Self-pay | Admitting: Nurse Practitioner

## 2023-08-21 DIAGNOSIS — I10 Essential (primary) hypertension: Secondary | ICD-10-CM

## 2023-08-23 NOTE — Telephone Encounter (Signed)
 Medication: Amlodipine-Olmesartan 10-40  Directions: Take 1 tablet by mouth daily  Last given: 12/21/22 Number refills: 3 Last o/v: 07/30/23 Follow up: 5 months-12/31/23 (scheduled) Labs: 06/25/23

## 2023-08-25 DIAGNOSIS — M4802 Spinal stenosis, cervical region: Secondary | ICD-10-CM | POA: Diagnosis not present

## 2023-08-25 DIAGNOSIS — M48062 Spinal stenosis, lumbar region with neurogenic claudication: Secondary | ICD-10-CM | POA: Diagnosis not present

## 2023-08-25 DIAGNOSIS — E119 Type 2 diabetes mellitus without complications: Secondary | ICD-10-CM | POA: Diagnosis not present

## 2023-08-25 DIAGNOSIS — I10 Essential (primary) hypertension: Secondary | ICD-10-CM | POA: Diagnosis not present

## 2023-08-25 DIAGNOSIS — G35 Multiple sclerosis: Secondary | ICD-10-CM | POA: Diagnosis not present

## 2023-08-25 DIAGNOSIS — G8220 Paraplegia, unspecified: Secondary | ICD-10-CM | POA: Diagnosis not present

## 2023-08-25 NOTE — Telephone Encounter (Signed)
 Copied from CRM [id number]. Topic: Clinical - Home Health Verbal Orders >> Aug 25, 2023 12:44 PM Lennart Pall wrote: [facility] Number: [phone] Service Requested: Physical Therapy Frequency: 1 week 1 ; 2 week 3; and 1 week 3 Any new concerns about the patient? Yes

## 2023-08-27 NOTE — Telephone Encounter (Signed)
 Forwarding message below

## 2023-08-27 NOTE — Telephone Encounter (Signed)
 Copied from CRM [id number]. Topic: Clinical - Home Health Verbal Orders >> Aug 25, 2023 12:44 PM Lennart Pall wrote: [facility]: [phone] Service Requested: Physical Therapy Frequency: 1 week 1 ; 2 week 3; and 1 week 3 Any new concerns about the patient? Yes >> Aug 26, 2023  3:06 PM Eunice Blase wrote: [facility] Callback Number: [phone] Service Requested: Physical Therapy Frequency: 1 week 1 ; 2 week 3; and 1 week 3

## 2023-08-27 NOTE — Telephone Encounter (Signed)
 Verbal orders were given per Alysia Penna, NP

## 2023-08-30 ENCOUNTER — Ambulatory Visit: Admitting: Urology

## 2023-08-30 ENCOUNTER — Encounter: Payer: Self-pay | Admitting: Urology

## 2023-08-30 VITALS — BP 157/66 | HR 60 | Ht 67.0 in | Wt 169.0 lb

## 2023-08-30 DIAGNOSIS — N138 Other obstructive and reflux uropathy: Secondary | ICD-10-CM | POA: Diagnosis not present

## 2023-08-30 DIAGNOSIS — N3942 Incontinence without sensory awareness: Secondary | ICD-10-CM

## 2023-08-30 DIAGNOSIS — Z87440 Personal history of urinary (tract) infections: Secondary | ICD-10-CM | POA: Diagnosis not present

## 2023-08-30 DIAGNOSIS — N401 Enlarged prostate with lower urinary tract symptoms: Secondary | ICD-10-CM

## 2023-08-30 NOTE — Progress Notes (Addendum)
 Assessment: 1. BPH with obstruction/lower urinary tract symptoms   2. Urinary incontinence without sensory awareness   3. History of UTI      Plan: Continue Gemtesa 75 mg daily. Continue finasteride Return to office in 3 months   Chief Complaint:  Chief Complaint  Patient presents with   Benign Prostatic Hypertrophy    History of Present Illness:  Larry [name] is a 73 y.o. male who is seen for evaluation of BPH with LUTS and urinary incontinence. He has partial paraplegia, MS, diabetes, and congestive heart failure. He has a long history of lower urinary tract symptoms.  He previously has had urinary retention requiring Foley catheter placement and alpha-blocker therapy.  More recently, he has experienced urinary incontinence.  He currently has urinary frequency, nocturia, nighttime incontinence, weak stream.  No dysuria or gross hematuria.  He was on finasteride and Myrbetriq 50 mg nightly.  He reported significant incontinence at night without awareness, requiring adult depends for management of his incontinence. No problems during the day.  No fecal incontinence.  He has a bowel movement 2-3 times per week. Urinalysis from 06/25/2023 showed >60 WBCs, moderate bacteria, nitrite positive.  No urine culture result obtained. No history of recurrent UTIs. IPSS = 12   PSA a from 7/24: 0.55  He has previously been evaluated by Dr. Berneice Heinrich at [facility].  His last visit there was in November 2021.  He has a history of urinary retention in July 2017 after lumbar spine surgery.  He subsequently passed a voiding trial and was placed on medical therapy with finasteride and Myrbetriq 25 mg daily.  Urine culture from 2/25: Staph epidermidis.  Treated with Levaquin x 7 days. He was started on Gemtesa 75 mg daily in place of Myrbetriq.  He returns today for follow-up.  He has noted improvement in his urinary symptoms with Gemtesa.  Specifically, he has seen an improvement in his  nighttime incontinence and nocturia.  No side effects from the medication.  No dysuria or gross hematuria.  Portions of the above documentation were copied from a prior visit for review purposes only.    Past Medical History:  Past Medical History:  Diagnosis Date   ABLA (acute blood loss anemia) 02/15/2022   Abnormality of gait    Acute delirium 12/11/2011   Acute encephalopathy 02/15/2022   Acute respiratory failure due to COVID-19 [facility]) 09/23/2019   Cognitive communication deficit    CTS (carpal tunnel syndrome) 09/26/2015   Diabetes mellitus    GI bleed 02/15/2022   Hypercholesterolemia    Hypertension    Kidney stones    Leukocytopenia 05/22/2013   MS (multiple sclerosis) (HCC)    sees [facility]   T10 spinal cord injury (HCC) 12/12/2011   hx surgery 1996 with bilat gait abnormality since   Tachycardia     Past Surgical History:  Past Surgical History:  Procedure Laterality Date   BACK SURGERY     BIOPSY  02/16/2022   Procedure: BIOPSY;  Surgeon: Tressia Danas, MD;  Location: [facility] ENDOSCOPY;  Service: Gastroenterology;;   COLONOSCOPY     ESOPHAGOGASTRODUODENOSCOPY (EGD) WITH PROPOFOL N/A 02/16/2022   Procedure: ESOPHAGOGASTRODUODENOSCOPY (EGD) WITH PROPOFOL;  Surgeon: Tressia Danas, MD;  Location: [facility] ENDOSCOPY;  Service: Gastroenterology;  Laterality: N/A;   LUMBAR LAMINECTOMY      Allergies:  Allergies  Allergen Reactions   Nsaids Other (See Comments)    Hx of GI bleed   Rosuvastatin Other (See Comments)    Leg swelling  Family History:  Family History  Problem Relation Age of Onset   Hypertension Mother    Liver cancer Father    Hypertension Sister     Social History:  Social History   Tobacco Use   Smoking status: Never   Smokeless tobacco: Never  Vaping Use   Vaping status: Never Used  Substance Use Topics   Alcohol use: No   Drug use: No    ROS: Constitutional:  Negative for fever, chills, weight loss CV: Negative for  chest pain, previous MI, hypertension Respiratory:  Negative for shortness of breath, wheezing, sleep apnea, frequent cough GI:  Negative for nausea, vomiting, bloody stool, GERD  Physical exam: BP (!) 157/66   Pulse 60   Ht 5\' 7"  (1.702 m)   Wt 169 lb (76.7 kg)   BMI 26.47 kg/m  GENERAL APPEARANCE:  Well appearing, well developed, well nourished, NAD HEENT:  Atraumatic, normocephalic, oropharynx clear NECK:  Supple without lymphadenopathy or thyromegaly ABDOMEN:  Soft, non-tender, no masses EXTREMITIES:  Without clubbing, cyanosis, or edema NEUROLOGIC:  Alert and oriented x 3, in wheelchair, CN II-XII grossly intact MENTAL STATUS:  appropriate BACK:  Non-tender to palpation, No CVAT SKIN:  Warm, dry, and intact  Results: U/A:  0-5 WBC, 0-2 RBC, + yeast

## 2023-09-01 DIAGNOSIS — I10 Essential (primary) hypertension: Secondary | ICD-10-CM | POA: Diagnosis not present

## 2023-09-01 DIAGNOSIS — M48062 Spinal stenosis, lumbar region with neurogenic claudication: Secondary | ICD-10-CM | POA: Diagnosis not present

## 2023-09-01 DIAGNOSIS — M4802 Spinal stenosis, cervical region: Secondary | ICD-10-CM | POA: Diagnosis not present

## 2023-09-01 DIAGNOSIS — E1142 Type 2 diabetes mellitus with diabetic polyneuropathy: Secondary | ICD-10-CM | POA: Diagnosis not present

## 2023-09-01 DIAGNOSIS — G8220 Paraplegia, unspecified: Secondary | ICD-10-CM | POA: Diagnosis not present

## 2023-09-01 DIAGNOSIS — E1169 Type 2 diabetes mellitus with other specified complication: Secondary | ICD-10-CM | POA: Diagnosis not present

## 2023-09-01 LAB — MICROSCOPIC EXAMINATION

## 2023-09-01 LAB — URINALYSIS, ROUTINE W REFLEX MICROSCOPIC
Bilirubin, UA: NEGATIVE
Ketones, UA: NEGATIVE
Leukocytes,UA: NEGATIVE
Nitrite, UA: NEGATIVE
Specific Gravity, UA: 1.015 (ref 1.005–1.030)
Urobilinogen, Ur: 0.2 mg/dL (ref 0.2–1.0)
pH, UA: 7.5 (ref 5.0–7.5)

## 2023-09-03 DIAGNOSIS — G8220 Paraplegia, unspecified: Secondary | ICD-10-CM | POA: Diagnosis not present

## 2023-09-03 DIAGNOSIS — E1142 Type 2 diabetes mellitus with diabetic polyneuropathy: Secondary | ICD-10-CM | POA: Diagnosis not present

## 2023-09-03 DIAGNOSIS — M4802 Spinal stenosis, cervical region: Secondary | ICD-10-CM | POA: Diagnosis not present

## 2023-09-03 DIAGNOSIS — E1169 Type 2 diabetes mellitus with other specified complication: Secondary | ICD-10-CM | POA: Diagnosis not present

## 2023-09-03 DIAGNOSIS — M48062 Spinal stenosis, lumbar region with neurogenic claudication: Secondary | ICD-10-CM | POA: Diagnosis not present

## 2023-09-03 DIAGNOSIS — I10 Essential (primary) hypertension: Secondary | ICD-10-CM | POA: Diagnosis not present

## 2023-09-06 DIAGNOSIS — I10 Essential (primary) hypertension: Secondary | ICD-10-CM | POA: Diagnosis not present

## 2023-09-06 DIAGNOSIS — E1142 Type 2 diabetes mellitus with diabetic polyneuropathy: Secondary | ICD-10-CM | POA: Diagnosis not present

## 2023-09-06 DIAGNOSIS — E1169 Type 2 diabetes mellitus with other specified complication: Secondary | ICD-10-CM | POA: Diagnosis not present

## 2023-09-06 DIAGNOSIS — G8220 Paraplegia, unspecified: Secondary | ICD-10-CM | POA: Diagnosis not present

## 2023-09-06 DIAGNOSIS — M48062 Spinal stenosis, lumbar region with neurogenic claudication: Secondary | ICD-10-CM | POA: Diagnosis not present

## 2023-09-06 DIAGNOSIS — M4802 Spinal stenosis, cervical region: Secondary | ICD-10-CM | POA: Diagnosis not present

## 2023-09-08 DIAGNOSIS — M48062 Spinal stenosis, lumbar region with neurogenic claudication: Secondary | ICD-10-CM | POA: Diagnosis not present

## 2023-09-08 DIAGNOSIS — M4802 Spinal stenosis, cervical region: Secondary | ICD-10-CM | POA: Diagnosis not present

## 2023-09-08 DIAGNOSIS — E1169 Type 2 diabetes mellitus with other specified complication: Secondary | ICD-10-CM | POA: Diagnosis not present

## 2023-09-08 DIAGNOSIS — I10 Essential (primary) hypertension: Secondary | ICD-10-CM | POA: Diagnosis not present

## 2023-09-08 DIAGNOSIS — E1142 Type 2 diabetes mellitus with diabetic polyneuropathy: Secondary | ICD-10-CM | POA: Diagnosis not present

## 2023-09-08 DIAGNOSIS — G8220 Paraplegia, unspecified: Secondary | ICD-10-CM | POA: Diagnosis not present

## 2023-09-13 ENCOUNTER — Telehealth: Payer: Self-pay

## 2023-09-13 DIAGNOSIS — M4802 Spinal stenosis, cervical region: Secondary | ICD-10-CM | POA: Diagnosis not present

## 2023-09-13 DIAGNOSIS — G8220 Paraplegia, unspecified: Secondary | ICD-10-CM | POA: Diagnosis not present

## 2023-09-13 DIAGNOSIS — E1142 Type 2 diabetes mellitus with diabetic polyneuropathy: Secondary | ICD-10-CM | POA: Diagnosis not present

## 2023-09-13 DIAGNOSIS — M48062 Spinal stenosis, lumbar region with neurogenic claudication: Secondary | ICD-10-CM | POA: Diagnosis not present

## 2023-09-13 DIAGNOSIS — I10 Essential (primary) hypertension: Secondary | ICD-10-CM | POA: Diagnosis not present

## 2023-09-13 DIAGNOSIS — E1169 Type 2 diabetes mellitus with other specified complication: Secondary | ICD-10-CM | POA: Diagnosis not present

## 2023-09-13 NOTE — Telephone Encounter (Signed)
 Copied from CRM [id number]. Topic: Clinical - Home Health Verbal Orders >> Sep 13, 2023  8:44 AM Howard Macho wrote: Caller/Agency: [facility] Callback Number: [phone] Service Requested: Occupational Therapy Frequency: evaluation Any new concerns about the patient? No

## 2023-09-13 NOTE — Telephone Encounter (Signed)
 Forwarding message below

## 2023-09-15 DIAGNOSIS — I10 Essential (primary) hypertension: Secondary | ICD-10-CM | POA: Diagnosis not present

## 2023-09-15 DIAGNOSIS — E1142 Type 2 diabetes mellitus with diabetic polyneuropathy: Secondary | ICD-10-CM | POA: Diagnosis not present

## 2023-09-15 DIAGNOSIS — M48062 Spinal stenosis, lumbar region with neurogenic claudication: Secondary | ICD-10-CM | POA: Diagnosis not present

## 2023-09-15 DIAGNOSIS — M4802 Spinal stenosis, cervical region: Secondary | ICD-10-CM | POA: Diagnosis not present

## 2023-09-15 DIAGNOSIS — G8220 Paraplegia, unspecified: Secondary | ICD-10-CM | POA: Diagnosis not present

## 2023-09-15 DIAGNOSIS — E1169 Type 2 diabetes mellitus with other specified complication: Secondary | ICD-10-CM | POA: Diagnosis not present

## 2023-09-15 NOTE — Telephone Encounter (Signed)
 Verbal order for OT eval given to Monique with [facility]. No further questions at this time.

## 2023-09-16 ENCOUNTER — Telehealth: Payer: Self-pay | Admitting: Nurse Practitioner

## 2023-09-16 DIAGNOSIS — E1142 Type 2 diabetes mellitus with diabetic polyneuropathy: Secondary | ICD-10-CM | POA: Diagnosis not present

## 2023-09-16 DIAGNOSIS — M4802 Spinal stenosis, cervical region: Secondary | ICD-10-CM | POA: Diagnosis not present

## 2023-09-16 DIAGNOSIS — M48062 Spinal stenosis, lumbar region with neurogenic claudication: Secondary | ICD-10-CM | POA: Diagnosis not present

## 2023-09-16 DIAGNOSIS — G8220 Paraplegia, unspecified: Secondary | ICD-10-CM | POA: Diagnosis not present

## 2023-09-16 DIAGNOSIS — E1169 Type 2 diabetes mellitus with other specified complication: Secondary | ICD-10-CM | POA: Diagnosis not present

## 2023-09-16 DIAGNOSIS — F411 Generalized anxiety disorder: Secondary | ICD-10-CM

## 2023-09-16 DIAGNOSIS — E785 Hyperlipidemia, unspecified: Secondary | ICD-10-CM | POA: Diagnosis not present

## 2023-09-16 DIAGNOSIS — I10 Essential (primary) hypertension: Secondary | ICD-10-CM | POA: Diagnosis not present

## 2023-09-16 DIAGNOSIS — Z7984 Long term (current) use of oral hypoglycemic drugs: Secondary | ICD-10-CM | POA: Diagnosis not present

## 2023-09-16 DIAGNOSIS — N400 Enlarged prostate without lower urinary tract symptoms: Secondary | ICD-10-CM

## 2023-09-16 NOTE — Telephone Encounter (Signed)
 Patient dropped off document Home Health Certificate (Order ID [id number]), to be filled out by provider. Patient requested to send it back via Fax within 7-days. Document is located in providers tray at front office.Please advise at  [phone]

## 2023-09-16 NOTE — Telephone Encounter (Signed)
 Forms are signed by provider and faxed to [facility].

## 2023-09-16 NOTE — Telephone Encounter (Signed)
 Called patient and informed him that paper work were faxed to [facility]. He thanked me for faxing it over and mentioned that he is happy that he is getting services are starting back.

## 2023-09-20 ENCOUNTER — Other Ambulatory Visit: Payer: Self-pay | Admitting: Nurse Practitioner

## 2023-09-20 DIAGNOSIS — G4700 Insomnia, unspecified: Secondary | ICD-10-CM

## 2023-09-23 DIAGNOSIS — M4802 Spinal stenosis, cervical region: Secondary | ICD-10-CM | POA: Diagnosis not present

## 2023-09-23 DIAGNOSIS — E1169 Type 2 diabetes mellitus with other specified complication: Secondary | ICD-10-CM | POA: Diagnosis not present

## 2023-09-23 DIAGNOSIS — I10 Essential (primary) hypertension: Secondary | ICD-10-CM | POA: Diagnosis not present

## 2023-09-23 DIAGNOSIS — M48062 Spinal stenosis, lumbar region with neurogenic claudication: Secondary | ICD-10-CM | POA: Diagnosis not present

## 2023-09-23 DIAGNOSIS — E1142 Type 2 diabetes mellitus with diabetic polyneuropathy: Secondary | ICD-10-CM | POA: Diagnosis not present

## 2023-09-23 DIAGNOSIS — G8220 Paraplegia, unspecified: Secondary | ICD-10-CM | POA: Diagnosis not present

## 2023-09-27 DIAGNOSIS — M48062 Spinal stenosis, lumbar region with neurogenic claudication: Secondary | ICD-10-CM | POA: Diagnosis not present

## 2023-09-27 DIAGNOSIS — M4802 Spinal stenosis, cervical region: Secondary | ICD-10-CM | POA: Diagnosis not present

## 2023-09-27 DIAGNOSIS — E1142 Type 2 diabetes mellitus with diabetic polyneuropathy: Secondary | ICD-10-CM | POA: Diagnosis not present

## 2023-09-27 DIAGNOSIS — E1169 Type 2 diabetes mellitus with other specified complication: Secondary | ICD-10-CM | POA: Diagnosis not present

## 2023-09-27 DIAGNOSIS — I10 Essential (primary) hypertension: Secondary | ICD-10-CM | POA: Diagnosis not present

## 2023-09-27 DIAGNOSIS — G8220 Paraplegia, unspecified: Secondary | ICD-10-CM | POA: Diagnosis not present

## 2023-09-29 DIAGNOSIS — G8220 Paraplegia, unspecified: Secondary | ICD-10-CM | POA: Diagnosis not present

## 2023-09-29 DIAGNOSIS — I10 Essential (primary) hypertension: Secondary | ICD-10-CM | POA: Diagnosis not present

## 2023-09-29 DIAGNOSIS — E1169 Type 2 diabetes mellitus with other specified complication: Secondary | ICD-10-CM | POA: Diagnosis not present

## 2023-09-29 DIAGNOSIS — M4802 Spinal stenosis, cervical region: Secondary | ICD-10-CM | POA: Diagnosis not present

## 2023-09-29 DIAGNOSIS — E1142 Type 2 diabetes mellitus with diabetic polyneuropathy: Secondary | ICD-10-CM | POA: Diagnosis not present

## 2023-09-29 DIAGNOSIS — M48062 Spinal stenosis, lumbar region with neurogenic claudication: Secondary | ICD-10-CM | POA: Diagnosis not present

## 2023-10-01 DIAGNOSIS — M48062 Spinal stenosis, lumbar region with neurogenic claudication: Secondary | ICD-10-CM | POA: Diagnosis not present

## 2023-10-01 DIAGNOSIS — E1142 Type 2 diabetes mellitus with diabetic polyneuropathy: Secondary | ICD-10-CM | POA: Diagnosis not present

## 2023-10-01 DIAGNOSIS — E1169 Type 2 diabetes mellitus with other specified complication: Secondary | ICD-10-CM | POA: Diagnosis not present

## 2023-10-01 DIAGNOSIS — I10 Essential (primary) hypertension: Secondary | ICD-10-CM | POA: Diagnosis not present

## 2023-10-01 DIAGNOSIS — M4802 Spinal stenosis, cervical region: Secondary | ICD-10-CM | POA: Diagnosis not present

## 2023-10-01 DIAGNOSIS — G8220 Paraplegia, unspecified: Secondary | ICD-10-CM | POA: Diagnosis not present

## 2023-10-02 ENCOUNTER — Other Ambulatory Visit: Payer: Self-pay | Admitting: Nurse Practitioner

## 2023-10-04 DIAGNOSIS — E1169 Type 2 diabetes mellitus with other specified complication: Secondary | ICD-10-CM | POA: Diagnosis not present

## 2023-10-04 DIAGNOSIS — M48062 Spinal stenosis, lumbar region with neurogenic claudication: Secondary | ICD-10-CM | POA: Diagnosis not present

## 2023-10-04 DIAGNOSIS — I10 Essential (primary) hypertension: Secondary | ICD-10-CM | POA: Diagnosis not present

## 2023-10-04 DIAGNOSIS — M4802 Spinal stenosis, cervical region: Secondary | ICD-10-CM | POA: Diagnosis not present

## 2023-10-04 DIAGNOSIS — E1142 Type 2 diabetes mellitus with diabetic polyneuropathy: Secondary | ICD-10-CM | POA: Diagnosis not present

## 2023-10-04 DIAGNOSIS — G8220 Paraplegia, unspecified: Secondary | ICD-10-CM | POA: Diagnosis not present

## 2023-10-06 ENCOUNTER — Telehealth: Payer: Self-pay

## 2023-10-06 DIAGNOSIS — M48062 Spinal stenosis, lumbar region with neurogenic claudication: Secondary | ICD-10-CM | POA: Diagnosis not present

## 2023-10-06 DIAGNOSIS — E1142 Type 2 diabetes mellitus with diabetic polyneuropathy: Secondary | ICD-10-CM | POA: Diagnosis not present

## 2023-10-06 DIAGNOSIS — E1169 Type 2 diabetes mellitus with other specified complication: Secondary | ICD-10-CM | POA: Diagnosis not present

## 2023-10-06 DIAGNOSIS — I10 Essential (primary) hypertension: Secondary | ICD-10-CM | POA: Diagnosis not present

## 2023-10-06 DIAGNOSIS — G8220 Paraplegia, unspecified: Secondary | ICD-10-CM | POA: Diagnosis not present

## 2023-10-06 DIAGNOSIS — M4802 Spinal stenosis, cervical region: Secondary | ICD-10-CM | POA: Diagnosis not present

## 2023-10-06 NOTE — Telephone Encounter (Signed)
 Received a fax from [facility] of a Client Occurrence Report. Patient had a fall on 10/04/23 and [facility] sent notification to PCP. I called the office of [facility] to ask to speak to someone involved with this case. I was placed on hold and a person who identified herself as Marlin Simmonds asked what can she help me with. I informed her that we received the fax of the occurrence report on the mutual patient. I asked if patient had any injuries from the fall that the report does not say. She stated that per the report patient did not have any injuries and/or go to the hospital. I asked her per the request of PCP provider Kathrene Parents, NP as to why does the report say that our office was notified and a message was left for physician on 10/04/23 when today was the first that our office is hearing about this matter. She asked if there is a name on the report I gave her the names that were on the report as Arlen Lacks, COTA and Rojean Cleaves, RN. I also informed her of the date prepared-10/04/23 and the date processed-10/06/23. She thanked me for calling and stated that she will be looking into the matter. I asked if the notice needed to be faxed back to their office she stated no that it was just to inform our office.

## 2023-10-07 DIAGNOSIS — M48062 Spinal stenosis, lumbar region with neurogenic claudication: Secondary | ICD-10-CM | POA: Diagnosis not present

## 2023-10-07 DIAGNOSIS — G8220 Paraplegia, unspecified: Secondary | ICD-10-CM | POA: Diagnosis not present

## 2023-10-07 DIAGNOSIS — M4802 Spinal stenosis, cervical region: Secondary | ICD-10-CM | POA: Diagnosis not present

## 2023-10-07 DIAGNOSIS — E1169 Type 2 diabetes mellitus with other specified complication: Secondary | ICD-10-CM | POA: Diagnosis not present

## 2023-10-07 DIAGNOSIS — E1142 Type 2 diabetes mellitus with diabetic polyneuropathy: Secondary | ICD-10-CM | POA: Diagnosis not present

## 2023-10-07 DIAGNOSIS — I10 Essential (primary) hypertension: Secondary | ICD-10-CM | POA: Diagnosis not present

## 2023-10-08 ENCOUNTER — Ambulatory Visit: Payer: Medicare Other | Admitting: Urology

## 2023-10-13 DIAGNOSIS — I10 Essential (primary) hypertension: Secondary | ICD-10-CM | POA: Diagnosis not present

## 2023-10-13 DIAGNOSIS — G8220 Paraplegia, unspecified: Secondary | ICD-10-CM | POA: Diagnosis not present

## 2023-10-13 DIAGNOSIS — E1169 Type 2 diabetes mellitus with other specified complication: Secondary | ICD-10-CM | POA: Diagnosis not present

## 2023-10-13 DIAGNOSIS — M48062 Spinal stenosis, lumbar region with neurogenic claudication: Secondary | ICD-10-CM | POA: Diagnosis not present

## 2023-10-13 DIAGNOSIS — E1142 Type 2 diabetes mellitus with diabetic polyneuropathy: Secondary | ICD-10-CM | POA: Diagnosis not present

## 2023-10-13 DIAGNOSIS — M4802 Spinal stenosis, cervical region: Secondary | ICD-10-CM | POA: Diagnosis not present

## 2023-10-15 DIAGNOSIS — M48062 Spinal stenosis, lumbar region with neurogenic claudication: Secondary | ICD-10-CM | POA: Diagnosis not present

## 2023-10-15 DIAGNOSIS — G8220 Paraplegia, unspecified: Secondary | ICD-10-CM | POA: Diagnosis not present

## 2023-10-15 DIAGNOSIS — I10 Essential (primary) hypertension: Secondary | ICD-10-CM | POA: Diagnosis not present

## 2023-10-15 DIAGNOSIS — E1142 Type 2 diabetes mellitus with diabetic polyneuropathy: Secondary | ICD-10-CM | POA: Diagnosis not present

## 2023-10-15 DIAGNOSIS — E1169 Type 2 diabetes mellitus with other specified complication: Secondary | ICD-10-CM | POA: Diagnosis not present

## 2023-10-15 DIAGNOSIS — M4802 Spinal stenosis, cervical region: Secondary | ICD-10-CM | POA: Diagnosis not present

## 2023-10-16 ENCOUNTER — Other Ambulatory Visit: Payer: Self-pay | Admitting: Nurse Practitioner

## 2023-10-16 DIAGNOSIS — G4700 Insomnia, unspecified: Secondary | ICD-10-CM

## 2023-10-22 ENCOUNTER — Other Ambulatory Visit: Payer: Self-pay | Admitting: Nurse Practitioner

## 2023-10-22 DIAGNOSIS — E1129 Type 2 diabetes mellitus with other diabetic kidney complication: Secondary | ICD-10-CM

## 2023-10-22 DIAGNOSIS — E1142 Type 2 diabetes mellitus with diabetic polyneuropathy: Secondary | ICD-10-CM

## 2023-10-22 DIAGNOSIS — E1169 Type 2 diabetes mellitus with other specified complication: Secondary | ICD-10-CM

## 2023-11-29 ENCOUNTER — Ambulatory Visit: Admitting: Urology

## 2023-12-20 ENCOUNTER — Other Ambulatory Visit: Payer: Self-pay | Admitting: Nurse Practitioner

## 2023-12-20 DIAGNOSIS — N401 Enlarged prostate with lower urinary tract symptoms: Secondary | ICD-10-CM

## 2023-12-23 ENCOUNTER — Telehealth: Payer: Self-pay | Admitting: Urology

## 2023-12-23 NOTE — Telephone Encounter (Signed)
 Patient called back and LVM knowing he needs to r/s and requested a call back. Called patient back and LVM for him to call office so we can reschedule appointment

## 2023-12-31 ENCOUNTER — Ambulatory Visit: Payer: Medicare Other | Admitting: Nurse Practitioner

## 2023-12-31 ENCOUNTER — Ambulatory Visit: Admitting: Urology

## 2024-01-12 ENCOUNTER — Ambulatory Visit: Admitting: Nurse Practitioner

## 2024-01-13 ENCOUNTER — Other Ambulatory Visit: Payer: Self-pay | Admitting: Nurse Practitioner

## 2024-01-13 NOTE — Telephone Encounter (Signed)
 Requesting: Pregabalin  (Lyrica ) 150 mg  Directions: Take 1 tablet by mouth 2 times a day  Last Visit: 07/30/2023 Next Visit: 01/28/2024 Last Refill: 07/13/23  PDMP Last reviewed by Katheen Roselie Rockford, NP on 06/20/2022 at 6:39 PM

## 2024-01-22 ENCOUNTER — Other Ambulatory Visit: Payer: Self-pay | Admitting: Nurse Practitioner

## 2024-01-22 DIAGNOSIS — E1169 Type 2 diabetes mellitus with other specified complication: Secondary | ICD-10-CM

## 2024-01-28 ENCOUNTER — Encounter: Payer: Self-pay | Admitting: Nurse Practitioner

## 2024-01-28 ENCOUNTER — Ambulatory Visit: Admitting: Urology

## 2024-01-28 ENCOUNTER — Ambulatory Visit: Admitting: Nurse Practitioner

## 2024-01-28 ENCOUNTER — Encounter: Payer: Self-pay | Admitting: Urology

## 2024-01-28 VITALS — BP 147/76 | HR 79 | Ht 67.0 in | Wt 169.0 lb

## 2024-01-28 VITALS — BP 134/78 | HR 82 | Temp 98.2°F | Ht 67.0 in | Wt 177.4 lb

## 2024-01-28 DIAGNOSIS — D500 Iron deficiency anemia secondary to blood loss (chronic): Secondary | ICD-10-CM | POA: Diagnosis not present

## 2024-01-28 DIAGNOSIS — Z87440 Personal history of urinary (tract) infections: Secondary | ICD-10-CM

## 2024-01-28 DIAGNOSIS — I10 Essential (primary) hypertension: Secondary | ICD-10-CM | POA: Diagnosis not present

## 2024-01-28 DIAGNOSIS — N138 Other obstructive and reflux uropathy: Secondary | ICD-10-CM | POA: Diagnosis not present

## 2024-01-28 DIAGNOSIS — R809 Proteinuria, unspecified: Secondary | ICD-10-CM

## 2024-01-28 DIAGNOSIS — E1129 Type 2 diabetes mellitus with other diabetic kidney complication: Secondary | ICD-10-CM

## 2024-01-28 DIAGNOSIS — E1169 Type 2 diabetes mellitus with other specified complication: Secondary | ICD-10-CM | POA: Diagnosis not present

## 2024-01-28 DIAGNOSIS — N3942 Incontinence without sensory awareness: Secondary | ICD-10-CM | POA: Diagnosis not present

## 2024-01-28 DIAGNOSIS — N401 Enlarged prostate with lower urinary tract symptoms: Secondary | ICD-10-CM | POA: Diagnosis not present

## 2024-01-28 DIAGNOSIS — E559 Vitamin D deficiency, unspecified: Secondary | ICD-10-CM

## 2024-01-28 LAB — URINALYSIS, ROUTINE W REFLEX MICROSCOPIC
Bilirubin, UA: NEGATIVE
Ketones, UA: NEGATIVE
Leukocytes,UA: NEGATIVE
Nitrite, UA: NEGATIVE
Specific Gravity, UA: 1.015 (ref 1.005–1.030)
Urobilinogen, Ur: 0.2 mg/dL (ref 0.2–1.0)
pH, UA: 7 (ref 5.0–7.5)

## 2024-01-28 LAB — COMPREHENSIVE METABOLIC PANEL WITH GFR
ALT: 15 U/L (ref 0–53)
AST: 23 U/L (ref 0–37)
Albumin: 4.6 g/dL (ref 3.5–5.2)
Alkaline Phosphatase: 107 U/L (ref 39–117)
BUN: 21 mg/dL (ref 6–23)
CO2: 29 meq/L (ref 19–32)
Calcium: 9.8 mg/dL (ref 8.4–10.5)
Chloride: 102 meq/L (ref 96–112)
Creatinine, Ser: 1.08 mg/dL (ref 0.40–1.50)
GFR: 67.15 mL/min (ref >60.00)
Glucose, Bld: 112 mg/dL — ABNORMAL HIGH (ref 70–99)
Potassium: 3.9 meq/L (ref 3.5–5.1)
Sodium: 143 meq/L (ref 135–145)
Total Bilirubin: 0.7 mg/dL (ref 0.2–1.2)
Total Protein: 7.3 g/dL (ref 6.0–8.3)

## 2024-01-28 LAB — CBC
HCT: 43.2 % (ref 39.0–52.0)
Hemoglobin: 14.3 g/dL (ref 13.0–17.0)
MCHC: 33.1 g/dL (ref 30.0–36.0)
MCV: 94.3 fl (ref 78.0–100.0)
Platelets: 253 K/uL (ref 150.0–400.0)
RBC: 4.58 Mil/uL (ref 4.22–5.81)
RDW: 15.4 % (ref 11.5–15.5)
WBC: 3.9 K/uL — ABNORMAL LOW (ref 4.0–10.5)

## 2024-01-28 LAB — MICROSCOPIC EXAMINATION: Bacteria, UA: NONE SEEN

## 2024-01-28 LAB — TSH: TSH: 1.27 u[IU]/mL (ref 0.35–5.50)

## 2024-01-28 LAB — IBC + FERRITIN
Ferritin: 78.8 ng/mL (ref 22.0–322.0)
Iron: 84 ug/dL (ref 42–165)
Saturation Ratios: 30.5 % (ref 20.0–50.0)
TIBC: 275.8 ug/dL (ref 250.0–450.0)
Transferrin: 197 mg/dL — ABNORMAL LOW (ref 212.0–360.0)

## 2024-01-28 LAB — HEMOGLOBIN A1C: Hgb A1c MFr Bld: 7.5 % — ABNORMAL HIGH (ref 4.6–6.5)

## 2024-01-28 LAB — VITAMIN D 25 HYDROXY (VIT D DEFICIENCY, FRACTURES): VITD: 42.04 ng/mL (ref 30.00–100.00)

## 2024-01-28 NOTE — Assessment & Plan Note (Addendum)
 Reminded again about need for DIABETES eye exam. Admits to high sugar, high carb diet. Drinks only 16oz of water daily Abnormal UACr-current use of ARB, SGLT-2, and metformin . He reports increased fatigue with increased jardiance  dose. Do not return to lab for return BMP within 30days of med dose adjustment as previously instructed.  Check CMP, UACr, and A1c. Will add Tsh and vit. D Advised to maintain at least 60oz of water daily, low carb/low sugar diet/low salt diet. Entered referral to nutritionist Maintain current med dose F/up in 3months

## 2024-01-28 NOTE — Patient Instructions (Addendum)
 Go to lab Maintain low sodium/salt mediterranean diet and daily exercise. Maintain at least 60oz of water daily Maintain current medications.  Mediterranean Diet A Mediterranean diet is based on the traditions of countries on the [employer]. It focuses on eating more: Fruits and vegetables. Whole grains, beans, nuts, and seeds. Heart-healthy fats. These are fats that are good for your heart. It involves eating less: Dairy. Meat and eggs. Processed foods with added sugar, salt, and fat. This type of diet can help prevent certain conditions. It can also improve outcomes if you have a long-term (chronic) disease, such as kidney or heart disease. What are tips for following this plan? Reading food labels Check packaged foods for: The serving size. For foods such as rice and pasta, the serving size is the amount of cooked product, not dry. The total fat. Avoid foods with saturated fat or trans fat. Added sugars, such as corn syrup. Shopping  Try to have a balanced diet. Buy a variety of foods, such as: Fresh fruits and vegetables. You may be able to get these from local farmers markets. You can also buy them frozen. Grains, beans, nuts, and seeds. Some of these can be bought in bulk. Fresh seafood. Poultry and eggs. Low-fat dairy products. Buy whole ingredients instead of foods that have already been packaged. If you can't get fresh seafood, buy precooked frozen shrimp or canned fish, such as tuna, salmon, or sardines. Stock your pantry so you always have certain foods on hand, such as olive oil, canned tuna, canned tomatoes, rice, pasta, and beans. Cooking Cook foods with extra-virgin olive oil instead of using butter or other vegetable oils. Have meat as a side dish. Have vegetables or grains as your main dish. This means having meat in small portions or adding small amounts of meat to foods like pasta or stew. Use beans or vegetables instead of meat in common dishes like chili  or lasagna. Try out different cooking methods. Try roasting, broiling, steaming, and sauting vegetables. Add frozen vegetables to soups, stews, pasta, or rice. Add nuts or seeds for added healthy fats and plant protein at each meal. You can add these to yogurt, salads, or vegetable dishes. Marinate fish or vegetables using olive oil, lemon juice, garlic, and fresh herbs. Meal planning Plan to eat a vegetarian meal one day each week. Try to work up to two vegetarian meals, if possible. Eat seafood two or more times a week. Have healthy snacks on hand. These may include: Vegetable sticks with hummus. Greek yogurt. Fruit and nut trail mix. Eat balanced meals. These should include: Fruit: 2-3 servings a day. Vegetables: 4-5 servings a day. Low-fat dairy: 2 servings a day. Fish, poultry, or lean meat: 1 serving a day. Beans and legumes: 2 or more servings a week. Nuts and seeds: 1-2 servings a day. Whole grains: 6-8 servings a day. Extra-virgin olive oil: 3-4 servings a day. Limit red meat and sweets to just a few servings a month. Lifestyle  Try to cook and eat meals with your family. Drink enough fluid to keep your pee (urine) pale yellow. Be active every day. This includes: Aerobic exercise, which is exercise that causes your heart to beat faster. Examples include running and swimming. Leisure activities like gardening, walking, or housework. Get 7-8 hours of sleep each night. Drink red wine if your provider says you can. A glass of wine is 5 oz (150 mL). You may be allowed to have: Up to 1 glass a day if you're  male and not pregnant. Up to 2 glasses a day if you're male. What foods should I eat? Fruits Apples. Apricots. Avocado. Berries. Bananas. Cherries. Dates. Figs. Grapes. Lemons. Melon. Oranges. Peaches. Plums. Pomegranate. Vegetables Artichokes. Beets. Broccoli. Cabbage. Carrots. Eggplant. Green beans. Chard. Kale. Spinach. Onions. Leeks. Peas. Squash. Tomatoes. Peppers.  Radishes. Grains Whole-grain pasta. Brown rice. Bulgur wheat. Polenta. Couscous. Whole-wheat bread. [place] Madeira. Meats and other proteins Beans. Almonds. Sunflower seeds. Pine nuts. Peanuts. Cod. Salmon. Scallops. Shrimp. Tuna. Tilapia. Clams. Oysters. Eggs. Chicken or [place] without skin. Dairy Low-fat milk. Cheese. Greek yogurt. Fats and oils Extra-virgin olive oil. Avocado oil. Grapeseed oil. Beverages Water. Red wine. Herbal tea. Sweets and desserts Greek yogurt with honey. Baked apples. Poached pears. Trail mix. Seasonings and condiments Basil. Cilantro. Coriander. Cumin. Mint. Parsley. Sage. Rosemary. Tarragon. Garlic. Oregano. Thyme. Pepper. Balsamic vinegar. Tahini. Hummus. Tomato sauce. Olives. Mushrooms. The items listed above may not be all the foods and drinks you can have. Talk to a dietitian to learn more. What foods should I limit? This is a list of foods that should be eaten rarely. Fruits Fruit canned in syrup. Vegetables Deep-fried potatoes, like [place] fries. Grains Packaged pasta or rice dishes. Cereal with added sugar. Snacks with added sugar. Meats and other proteins Beef. Pork. Lamb. Chicken or [place] with skin. Hot dogs. [place]. Dairy Ice cream. Sour cream. Whole milk. Fats and oils Butter. Canola oil. Vegetable oil. Beef fat (tallow). Lard. Beverages Juice. Sugar-sweetened soft drinks. Beer. Liquor and spirits. Sweets and desserts Cookies. Cakes. Pies. Candy. Seasonings and condiments Mayonnaise. Pre-made sauces and marinades. The items listed above may not be all the foods and drinks you should limit. Talk to a dietitian to learn more. Where to find more information American Heart Association (AHA): heart.org This information is not intended to replace advice given to you by your health care provider. Make sure you discuss any questions you have with your health care provider. Document Revised: 08/30/2022 Document Reviewed: 08/30/2022 Elsevier  Patient Education  2024 [employer].

## 2024-01-28 NOTE — Assessment & Plan Note (Signed)
 Improved BP and at goal with DASH diet and med compliance BP Readings from Last 3 Encounters:  01/28/24 134/78  01/28/24 (!) 147/76  08/30/23 (!) 157/66    Maintain med dose Encouraged to maintain DASH diet and use compression stocking for LE edema Repeat BMP

## 2024-01-28 NOTE — Assessment & Plan Note (Addendum)
 Reports increased fatigue in last 6months. EGD completed 2023: gastric and duodenal ulcer non-bleeding. Pathology report notes Reactive gastropathy with mild chronic gastritis. Negative for H. pylori, intestinal metaplasia, dysplasia and carcinoma. Current use of pantoprazole  40mg  and ferrous sulfate  325mg  daily  Repeat CBC and ibc/ferritin

## 2024-01-28 NOTE — Progress Notes (Signed)
 Assessment: 1. BPH with obstruction/lower urinary tract symptoms   2. Urinary incontinence without sensory awareness   3. History of UTI    Plan: Continue Gemtesa  75 mg daily.  Recommend taking the medication in the morning to see if this may work better for his symptoms. Continue finasteride  Return to office in 6 months  Chief Complaint:  Chief Complaint  Patient presents with   Benign Prostatic Hypertrophy    History of Present Illness:  Larry [name] is a 73 y.o. male who is seen for evaluation of BPH with LUTS and urinary incontinence. He has partial paraplegia, MS, diabetes, and congestive heart failure. He has a long history of lower urinary tract symptoms.  He previously has had urinary retention requiring Foley catheter placement and alpha-blocker therapy.  More recently, he has experienced urinary incontinence.  He currently has urinary frequency, nocturia, nighttime incontinence, weak stream.  No dysuria or gross hematuria.  He was on finasteride  and Myrbetriq  50 mg nightly.  He reported significant incontinence at night without awareness, requiring adult depends for management of his incontinence. No problems during the day.  No fecal incontinence.  He has a bowel movement 2-3 times per week. Urinalysis from 06/25/2023 showed >60 WBCs, moderate bacteria, nitrite positive.  No urine culture result obtained. No history of recurrent UTIs. IPSS = 12   PSA a from 7/24: 0.55  He has previously been evaluated by Dr. Alvaro at [facility].  His last visit there was in November 2021.  He has a history of urinary retention in July 2017 after lumbar spine surgery.  He subsequently passed a voiding trial and was placed on medical therapy with finasteride  and Myrbetriq  25 mg daily.  Urine culture from 2/25: Staph epidermidis.  Treated with Levaquin  x 7 days. He was started on Gemtesa  75 mg daily in place of Myrbetriq .  At his visit in March 2025, he noted improvement in his  urinary symptoms with Gemtesa .  Specifically, he had seen an improvement in his nighttime incontinence and nocturia.  No side effects from the medication.  No dysuria or gross hematuria.  He returns today for follow-up.  He continues on Gemtesa  75 mg daily and finasteride .  He reports that his symptoms are fairly well-controlled with the medication.  He continues to have nocturia 2-3 times per night.  He does have occasional incontinence at night.  He does have some urgency and minimal urge incontinence during the daytime.  This typically occurs in the afternoon.  No dysuria or gross hematuria. IPSS = 6/3.  Portions of the above documentation were copied from a prior visit for review purposes only.    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   hx surgery 1996 with bilat gait abnormality since   Tachycardia     Past Surgical History:  Past Surgical History:  Procedure Laterality Date   BACK SURGERY     BIOPSY  02/16/2022  Procedure: BIOPSY;  Surgeon: Eda Iha, MD;  Location: [facility] ENDOSCOPY;  Service: Gastroenterology;;   COLONOSCOPY     ESOPHAGOGASTRODUODENOSCOPY (EGD) WITH PROPOFOL  N/A 02/16/2022   Procedure: ESOPHAGOGASTRODUODENOSCOPY (EGD) WITH PROPOFOL ;  Surgeon: Eda Iha, MD;  Location: [facility] ENDOSCOPY;  Service: Gastroenterology;  Laterality: N/A;   LUMBAR LAMINECTOMY      Allergies:  Allergies  Allergen Reactions   Nsaids Other (See Comments)     Hx of GI bleed   Rosuvastatin  Other (See Comments)    Leg swelling    Family History:  Family History  Problem Relation Age of Onset   Hypertension Mother    Liver cancer Father    Hypertension Sister     Social History:  Social History   Tobacco Use   Smoking status: Never   Smokeless tobacco: Never  Vaping Use   Vaping status: Never Used  Substance Use Topics   Alcohol use: No   Drug use: No   ROS: Constitutional:  Negative for fever, chills, weight loss CV: Negative for chest pain, previous MI, hypertension Respiratory:  Negative for shortness of breath, wheezing, sleep apnea, frequent cough GI:  Negative for nausea, vomiting, bloody stool, GERD  Physical exam: BP (!) 147/76   Pulse 79   Ht 5' 7 (1.702 m)   Wt 169 lb (76.7 kg)   BMI 26.47 kg/m  GENERAL APPEARANCE:  Well appearing, well developed, well nourished, NAD HEENT:  Atraumatic, normocephalic, oropharynx clear NECK:  Supple without lymphadenopathy or thyromegaly ABDOMEN:  Soft, non-tender, no masses EXTREMITIES:  Without clubbing, cyanosis, or edema NEUROLOGIC:  Alert and oriented x 3, in wheelchair, CN II-XII grossly intact MENTAL STATUS:  appropriate BACK:  Non-tender to palpation, No CVAT SKIN:  Warm, dry, and intact  Results: No specimen provided

## 2024-01-28 NOTE — Progress Notes (Signed)
 Established Patient Visit  Patient: Larry [name]   DOB: 1951/04/10   73 y.o. Male  MRN: [id number] Visit Date: 01/28/2024  Subjective:    Chief Complaint  Patient presents with   Follow-up    6 month follow up having some weakness, fatigue sluggish, don't want to move    HPI Hypertension Improved BP and at goal with DASH diet and med compliance BP Readings from Last 3 Encounters:  01/28/24 134/78  01/28/24 (!) 147/76  08/30/23 (!) 157/66    Maintain med dose Encouraged to maintain DASH diet and use compression stocking for LE edema Repeat BMP  DM (diabetes mellitus) (HCC) Reminded again about need for DIABETES eye exam. Admits to high sugar, high carb diet. Drinks only 16oz of water daily Abnormal UACr-current use of ARB, SGLT-2, and metformin . He reports increased fatigue with increased jardiance  dose. Do not return to lab for return BMP within 30days of med dose adjustment as previously instructed.  Check CMP, UACr, and A1c. Will add Tsh and vit. D Advised to maintain at least 60oz of water daily, low carb/low sugar diet/low salt diet. Entered referral to nutritionist Maintain current med dose F/up in 3months  Iron deficiency anemia Reports increased fatigue in last 6months. EGD completed 2023: gastric and duodenal ulcer non-bleeding. Pathology report notes Reactive gastropathy with mild chronic gastritis. Negative for H. pylori, intestinal metaplasia, dysplasia and carcinoma. Current use of pantoprazole  40mg  and ferrous sulfate  325mg  daily  Repeat CBC and ibc/ferritin  Wt Readings from Last 3 Encounters:  01/28/24 177 lb 6.4 oz (80.5 kg)  01/28/24 169 lb (76.7 kg)  08/30/23 169 lb (76.7 kg)    Reviewed medical, surgical, and social history today  Medications: Outpatient Medications Prior to Visit  Medication Sig   ACCU-CHEK AVIVA PLUS test strip TEST FASTING BLOOD SUGAR ONCE  DAILY IN THE MORNING   Accu-Chek FastClix Lancets MISC     amLODipine -olmesartan  (AZOR ) 10-40 MG tablet TAKE 1 TABLET BY MOUTH DAILY   atorvastatin  (LIPITOR) 20 MG tablet TAKE 1 TABLET BY MOUTH DAILY   baclofen  (LIORESAL ) 20 MG tablet TAKE 1 TABLET BY MOUTH 3 TIMES  DAILY   Blood Glucose Calibration (ACCU-CHEK AVIVA) SOLN    empagliflozin  (JARDIANCE ) 25 MG TABS tablet TAKE 1 TABLET BY MOUTH DAILY   ferrous sulfate  325 (65 FE) MG tablet Take 1 tablet (325 mg total) by mouth daily with breakfast.   finasteride  (PROSCAR ) 5 MG tablet TAKE 1 TABLET BY MOUTH DAILY   Incontinence Supply Disposable (DEPEND ADJUSTABLE UNDERWEAR LG) MISC Use as needed.   Lancets Misc. (ACCU-CHEK FASTCLIX LANCET) KIT 1 Units by Does not apply route daily before breakfast.   metFORMIN  (GLUCOPHAGE -XR) 750 MG 24 hr tablet TAKE 1 TABLET BY MOUTH DAILY  WITH BREAKFAST   Multiple Vitamin (MULTIVITAMIN) tablet Take 1 tablet by mouth daily.   pantoprazole  (PROTONIX ) 40 MG tablet Take 1 tablet (40 mg total) by mouth 2 (two) times daily before a meal.   pregabalin  (LYRICA ) 150 MG capsule TAKE 1 CAPSULE BY MOUTH TWICE  DAILY   traZODone  (DESYREL ) 50 MG tablet TAKE 1 TABLET BY MOUTH AT  BEDTIME   Vibegron  (GEMTESA ) 75 MG TABS Take 1 tablet (75 mg total) by mouth daily.   No facility-administered medications prior to visit.   Reviewed past medical and social history.   ROS per HPI above      Objective:  BP 134/78 (BP Location: Left  Arm, Patient Position: Sitting, Cuff Size: Normal)   Pulse 82   Temp 98.2 F (36.8 C) (Oral)   Ht 5' 7 (1.702 m)   Wt 177 lb 6.4 oz (80.5 kg)   SpO2 96%   BMI 27.78 kg/m      Physical Exam Vitals and nursing note reviewed.  Cardiovascular:     Rate and Rhythm: Normal rate and regular rhythm.     Pulses: Normal pulses.     Heart sounds: Normal heart sounds.  Pulmonary:     Effort: Pulmonary effort is normal.     Breath sounds: Normal breath sounds.  Musculoskeletal:     Right lower leg: Edema present.     Left lower leg: Edema present.   Skin:    Findings: No erythema.  Neurological:     Mental Status: He is alert and oriented to person, place, and time.     Results for orders placed or performed in visit on 01/28/24  Microscopic Examination   Urine  Result Value Ref Range   WBC, UA 6-10 (A) 0 - 5 /hpf   RBC, Urine 0-2 0 - 2 /hpf   Epithelial Cells (non renal) 0-10 0 - 10 /hpf   Bacteria, UA None seen None seen/Few  Urinalysis, Routine w reflex microscopic  Result Value Ref Range   Specific Gravity, UA 1.015 1.005 - 1.030   pH, UA 7.0 5.0 - 7.5   Color, UA Yellow Yellow   Appearance Ur Hazy (A) Clear   Leukocytes,UA Negative Negative   Protein,UA 1+ (A) Negative/Trace   Glucose, UA 3+ (A) Negative   Ketones, UA Negative Negative   RBC, UA Trace (A) Negative   Bilirubin, UA Negative Negative   Urobilinogen, Ur 0.2 0.2 - 1.0 mg/dL   Nitrite, UA Negative Negative   Microscopic Examination See below:       Assessment & Plan:    Problem List Items Addressed This Visit     DM (diabetes mellitus) (HCC) - Primary   Reminded again about need for DIABETES eye exam. Admits to high sugar, high carb diet. Drinks only 16oz of water daily Abnormal UACr-current use of ARB, SGLT-2, and metformin . He reports increased fatigue with increased jardiance  dose. Do not return to lab for return BMP within 30days of med dose adjustment as previously instructed.  Check CMP, UACr, and A1c. Will add Tsh and vit. D Advised to maintain at least 60oz of water daily, low carb/low sugar diet/low salt diet. Entered referral to nutritionist Maintain current med dose F/up in 3months      Relevant Orders   Comprehensive metabolic panel with GFR   Hemoglobin A1c   Referral to Nutrition and Diabetes Services   Microalbumin / creatinine urine ratio   Hypertension   Improved BP and at goal with DASH diet and med compliance BP Readings from Last 3 Encounters:  01/28/24 134/78  01/28/24 (!) 147/76  08/30/23 (!) 157/66    Maintain  med dose Encouraged to maintain DASH diet and use compression stocking for LE edema Repeat BMP      Relevant Orders   TSH   Referral to Nutrition and Diabetes Services   Iron deficiency anemia   Reports increased fatigue in last 6months. EGD completed 2023: gastric and duodenal ulcer non-bleeding. Pathology report notes Reactive gastropathy with mild chronic gastritis. Negative for H. pylori, intestinal metaplasia, dysplasia and carcinoma. Current use of pantoprazole  40mg  and ferrous sulfate  325mg  daily  Repeat CBC and ibc/ferritin  Relevant Orders   CBC   IBC + Ferritin   Microalbuminuria due to type 2 diabetes mellitus (HCC)   Relevant Orders   Microalbumin / creatinine urine ratio   Vitamin D  deficiency   Relevant Orders   VITAMIN D  25 Hydroxy (Vit-D Deficiency, Fractures)   Return in about 3 months (around 04/29/2024) for HTN, DM, hyperlipidemia (fasting).     Roselie Mood, NP

## 2024-01-29 LAB — MICROALBUMIN / CREATININE URINE RATIO
Creatinine, Urine: 46 mg/dL (ref 20–320)
Microalb Creat Ratio: 224 mg/g{creat} — ABNORMAL HIGH (ref <30)
Microalb, Ur: 10.3 mg/dL

## 2024-01-29 LAB — PSA: Prostate Specific Ag, Serum: 1.3 ng/mL (ref 0.0–4.0)

## 2024-02-01 ENCOUNTER — Ambulatory Visit: Payer: Self-pay | Admitting: Urology

## 2024-02-01 DIAGNOSIS — Z0279 Encounter for issue of other medical certificate: Secondary | ICD-10-CM

## 2024-02-03 ENCOUNTER — Ambulatory Visit: Payer: Self-pay | Admitting: Nurse Practitioner

## 2024-02-03 DIAGNOSIS — D509 Iron deficiency anemia, unspecified: Secondary | ICD-10-CM

## 2024-02-03 DIAGNOSIS — I10 Essential (primary) hypertension: Secondary | ICD-10-CM

## 2024-02-03 MED ORDER — FERROUS SULFATE 325 (65 FE) MG PO TABS
325.0000 mg | ORAL_TABLET | Freq: Every day | ORAL | 1 refills | Status: AC
Start: 2024-02-03 — End: ?

## 2024-02-03 MED ORDER — AMLODIPINE-OLMESARTAN 10-40 MG PO TABS: 1.0000 | ORAL_TABLET | Freq: Every day | ORAL | 1 refills | Status: AC

## 2024-02-04 ENCOUNTER — Telehealth: Payer: Self-pay

## 2024-02-04 NOTE — Telephone Encounter (Signed)
The patient has been notified of the result and verbalized understanding.  All questions (if any) were answered.     

## 2024-02-04 NOTE — Telephone Encounter (Signed)
 Copied from CRM [id number]. Topic: Clinical - Lab/Test Results >> Feb 04, 2024 10:28 AM Dedra B wrote: Reason for CRM: Pt returning call regarding lab results. Please call pt.

## 2024-02-17 ENCOUNTER — Telehealth: Payer: Self-pay

## 2024-02-17 DIAGNOSIS — M4802 Spinal stenosis, cervical region: Secondary | ICD-10-CM

## 2024-02-17 DIAGNOSIS — M48062 Spinal stenosis, lumbar region with neurogenic claudication: Secondary | ICD-10-CM

## 2024-02-17 DIAGNOSIS — G8220 Paraplegia, unspecified: Secondary | ICD-10-CM

## 2024-02-17 NOTE — Telephone Encounter (Signed)
 Copied from CRM [id number]. Topic: Clinical - Medication Question >> Feb 17, 2024  1:13 PM Shereese L wrote: Reason for CRM: Patient is requesting a order for physical therapy with the company [facility] and their phone# is [phone]. Patient would also like a call back to discuss

## 2024-02-17 NOTE — Telephone Encounter (Signed)
 Called patient back as requested and he stated that he wanted to let Roselie know that he wanted PT again that he feels like he is getting weaker from not having PT for some months now. I informed him that I will pass this to [place]. He thanked me for calling him back.

## 2024-02-17 NOTE — Telephone Encounter (Signed)
 Copied from CRM [id number]. Topic: General - Call Back - No Documentation >> Feb 17, 2024  4:39 PM Rea C wrote: Reason for CRM: Patient is returning call to speak with Joeseph. Called [facility]. Lost connection due to possible [employer] or WebEx issue.    [phone] (H)

## 2024-02-17 NOTE — Telephone Encounter (Signed)
 Called patient and left a voice message per DPR on file

## 2024-02-21 ENCOUNTER — Other Ambulatory Visit: Payer: Self-pay | Admitting: Nurse Practitioner

## 2024-02-21 DIAGNOSIS — G4700 Insomnia, unspecified: Secondary | ICD-10-CM

## 2024-02-25 ENCOUNTER — Telehealth: Payer: Self-pay

## 2024-02-25 NOTE — Telephone Encounter (Signed)
 CLINICAL USE BELOW THIS LINE (use X to signify taken)  _X___Form received and placed in providers office for signature. _X___Form completed and faxed to LOA Dept. _X___Form completed & LVM to notify pt ready for pick up _X___Charge sheet & copy of form in front office folder for office supervisor.

## 2024-02-25 NOTE — Addendum Note (Signed)
 Addended by: KATHEEN ROSELIE CROME on: 02/25/2024 04:21 PM   Modules accepted: Orders

## 2024-02-25 NOTE — Telephone Encounter (Signed)
 Patient wants to restart home PT with [facility]

## 2024-02-25 NOTE — Telephone Encounter (Signed)
 Called and left a voice message for patient that forms were faxed to [employer]

## 2024-02-25 NOTE — Telephone Encounter (Signed)
 Patient and his daughter brought in the [employer] [place] forms at his 01/28/24 forms. Forms were completed and signed by Roselie Mood, NP on 02/01/24. After multiple attempts to call the number on the application to get a fax number I spoke with Jennetta and was given a fax number to fax the application.

## 2024-03-03 NOTE — Telephone Encounter (Signed)
 Sent a community message to [employer] about the referral and she is looking into the referral and get back to me.

## 2024-03-03 NOTE — Telephone Encounter (Signed)
 Copied from CRM [id number]. Topic: Clinical - Medical Advice >> Mar 03, 2024  1:54 PM Mercedes MATSU wrote: Reason for CRM: Patient called in stating that he was requesting services for home health. Patient is requesting a call back at [phone]. Patient wants to know what is going on with the referral and if he can receive these services.

## 2024-03-03 NOTE — Telephone Encounter (Signed)
 Called patient and informed him that referral was placed and that I will call the office and give him a call back. He thanked me for calling him back.

## 2024-03-03 NOTE — Telephone Encounter (Signed)
 Called patient to informed him that [employer] is working on the referral and we should know something soon. He thanked me for calling

## 2024-03-20 ENCOUNTER — Telehealth: Payer: Self-pay

## 2024-03-20 NOTE — Telephone Encounter (Signed)
 Called patient and left a voice message per DPR on file asking to give me a call back at the office at [phone].  Per staff message from Larry Mood, NP: Joeseph please schedule [name] for another appointment to eval need for another home health PT.  Recent office visit does not needs insurance criteria.

## 2024-03-20 NOTE — Telephone Encounter (Signed)
 Spoke to ( [facility]) and provided verbal orders.

## 2024-03-20 NOTE — Telephone Encounter (Signed)
 Called patient back and informed him of what is needed and tried to get him scheduled for an appointment. He stated that he will have to talk to his daughter first because she is the one who brings him to his appointments. I also asked if someone from [employer] spoke to him about his insurance company will not pay for the PT services unless he gets in to see [place] within 30 days. Patient stated that he was told that the insurance company will not pay and something about see his doctor. He stated that he was not told a number of days. I stated for him to speak with his daughter and to get back to me as soon as possible so that I can get him scheduled for an appointment. He thanked me for calling and stated that he will be speaking with her later today and give me a call back either today or tomorrow.

## 2024-03-20 NOTE — Telephone Encounter (Signed)
 Patient returned my call and I explained why I had called him. Patient stated that he has already had PT come out to his home. When asked what home agency came out to his home he stated that it was [facility]. Asked patient to hold while I discuss with Roselie. Informed Roselie what was said by patient and how it does not match with the staff message she sent to me. She asked me to call the agency to ask what is needed or not needed at this time for the referral.

## 2024-03-20 NOTE — Telephone Encounter (Signed)
 Copied from CRM [id number]. Topic: Clinical - Home Health Verbal Orders >> Mar 20, 2024  2:30 PM Rea BROCKS wrote: Caller/Agency: [facility]  Callback Number: [phone] Service Requested: Physical Therapy-lower body , OT - for shoulder pain-arm strength Frequency: PT: 1x for 5 weeks and OT don't know yet  Any new concerns about the patient?

## 2024-03-20 NOTE — Telephone Encounter (Signed)
 Called the number listed for Jon from [facility]. I informed her why I was calling. She informed me that the 01/28/24 office not and the referral that was placed after that office visit does not mention the diagnoses Roselie assigned to referral and in order for patient's insurance to pay for the PT services the patient will need a updated office visit with Roselie discussing the diagnoses associated with the referral or have Roselie amend her 01/28/24 note to reflect. I informed her that the patient had called into our office requesting a new referral to PT a couple of weeks after the office visit. She also informed me that they agency has been out to patient's home today and explained to him that his insurance will not pay for the services unless he gets into the office to see Roselie within 30 days. I informed Jon that I will let Roselie know this information and call the patient about an appointment.   Roselie was informed and stated for me to move forward as she previously requested and to call patient and get him scheduled for an appointment with her soon.

## 2024-03-30 ENCOUNTER — Telehealth: Payer: Self-pay

## 2024-03-30 NOTE — Telephone Encounter (Signed)
 Printed the 01/28/24 office note and faxed to the attention of Rhonda. I called Rhonda at the number given and informed her that the last office note was faxed to her attention. She thanked me for calling.

## 2024-03-30 NOTE — Telephone Encounter (Signed)
 Copied from CRM #[id number]. Topic: General - Other >> Mar 30, 2024  2:24 PM Dedra B wrote: Reason for CRM: Shona from [facility] needs pt's last office visit notes showing diagnoses codes for diabetes and neuropathy. Please fax to [phone]. Manuelita can be reached at [phone] if any questions.

## 2024-04-05 ENCOUNTER — Telehealth: Payer: Self-pay | Admitting: Nurse Practitioner

## 2024-04-05 DIAGNOSIS — G8220 Paraplegia, unspecified: Secondary | ICD-10-CM

## 2024-04-05 DIAGNOSIS — I10 Essential (primary) hypertension: Secondary | ICD-10-CM

## 2024-04-05 DIAGNOSIS — D500 Iron deficiency anemia secondary to blood loss (chronic): Secondary | ICD-10-CM

## 2024-04-05 DIAGNOSIS — E1142 Type 2 diabetes mellitus with diabetic polyneuropathy: Secondary | ICD-10-CM | POA: Diagnosis not present

## 2024-04-05 DIAGNOSIS — M48062 Spinal stenosis, lumbar region with neurogenic claudication: Secondary | ICD-10-CM

## 2024-04-05 DIAGNOSIS — E1169 Type 2 diabetes mellitus with other specified complication: Secondary | ICD-10-CM | POA: Diagnosis not present

## 2024-04-05 DIAGNOSIS — N401 Enlarged prostate with lower urinary tract symptoms: Secondary | ICD-10-CM

## 2024-04-05 DIAGNOSIS — E1129 Type 2 diabetes mellitus with other diabetic kidney complication: Secondary | ICD-10-CM | POA: Diagnosis not present

## 2024-04-05 DIAGNOSIS — M4802 Spinal stenosis, cervical region: Secondary | ICD-10-CM

## 2024-04-05 DIAGNOSIS — E785 Hyperlipidemia, unspecified: Secondary | ICD-10-CM

## 2024-04-05 DIAGNOSIS — R809 Proteinuria, unspecified: Secondary | ICD-10-CM | POA: Diagnosis not present

## 2024-04-05 DIAGNOSIS — N39498 Other specified urinary incontinence: Secondary | ICD-10-CM

## 2024-04-05 NOTE — Telephone Encounter (Signed)
 Patient dropped off document Home Health Certificate (Order ID [id number]), to be filled out by provider. Patient requested to send it back via Fax within 7-days. Document is located in providers tray at front office.Please advise at Mobile [phone] (mobile)   Home health order came through fax. I put in the dr box

## 2024-04-05 NOTE — Telephone Encounter (Signed)
 CLINICAL USE BELOW THIS LINE (use X to signify taken)  ____Form received and placed in providers office for signature. _X___Form completed and faxed to Home Health Agency. _X___Form completed & LVM to notify pt forms faxed to Home Health Agency _X___Charge sheet & copy of form in front office folder for office supervisor.

## 2024-04-05 NOTE — Telephone Encounter (Signed)
CLINICAL USE BELOW THIS LINE (use X to signify taken)  __X__Form received and placed in providers office for signature. ____Form completed and faxed to LOA Dept. ____Form completed & LVM to notify pt ready for pick up ____Charge sheet & copy of form in front office folder for office supervisor.   

## 2024-04-12 ENCOUNTER — Telehealth: Payer: Self-pay

## 2024-04-12 NOTE — Telephone Encounter (Signed)
 Copied from CRM [id number]. Topic: Clinical - Home Health Verbal Orders >> Apr 12, 2024 12:51 PM Rosina BIRCH wrote: Caller/Agency: steve from [facility]  Callback Number: [phone] Service Requested: Physical Therapy Frequency: one week four Any new concerns about the patient? No

## 2024-04-13 NOTE — Telephone Encounter (Signed)
 Called Marcey and informed him that Roselie Mood, NP gave the okay for the home physical therapy for patient once a week four. He thanked me for calling with the information.

## 2024-05-03 ENCOUNTER — Other Ambulatory Visit: Payer: Self-pay | Admitting: Nurse Practitioner

## 2024-05-03 DIAGNOSIS — E118 Type 2 diabetes mellitus with unspecified complications: Secondary | ICD-10-CM

## 2024-05-05 ENCOUNTER — Ambulatory Visit: Admitting: Nurse Practitioner

## 2024-05-05 ENCOUNTER — Ambulatory Visit: Payer: Self-pay | Admitting: Nurse Practitioner

## 2024-05-05 ENCOUNTER — Encounter: Payer: Self-pay | Admitting: Nurse Practitioner

## 2024-05-05 VITALS — BP 138/70 | HR 78 | Temp 97.6°F | Wt 172.8 lb

## 2024-05-05 DIAGNOSIS — R809 Proteinuria, unspecified: Secondary | ICD-10-CM

## 2024-05-05 DIAGNOSIS — I10 Essential (primary) hypertension: Secondary | ICD-10-CM | POA: Diagnosis not present

## 2024-05-05 DIAGNOSIS — G4700 Insomnia, unspecified: Secondary | ICD-10-CM

## 2024-05-05 DIAGNOSIS — E1169 Type 2 diabetes mellitus with other specified complication: Secondary | ICD-10-CM

## 2024-05-05 DIAGNOSIS — E1129 Type 2 diabetes mellitus with other diabetic kidney complication: Secondary | ICD-10-CM | POA: Diagnosis not present

## 2024-05-05 DIAGNOSIS — E785 Hyperlipidemia, unspecified: Secondary | ICD-10-CM

## 2024-05-05 DIAGNOSIS — E1142 Type 2 diabetes mellitus with diabetic polyneuropathy: Secondary | ICD-10-CM

## 2024-05-05 DIAGNOSIS — Z7984 Long term (current) use of oral hypoglycemic drugs: Secondary | ICD-10-CM

## 2024-05-05 LAB — POCT GLYCOSYLATED HEMOGLOBIN (HGB A1C): Hemoglobin A1C: 6.8 % — AB (ref 4.0–5.6)

## 2024-05-05 LAB — RENAL FUNCTION PANEL
Albumin: 4.6 g/dL (ref 3.5–5.2)
BUN: 17 mg/dL (ref 6–23)
CO2: 32 meq/L (ref 19–32)
Calcium: 9.9 mg/dL (ref 8.4–10.5)
Chloride: 104 meq/L (ref 96–112)
Creatinine, Ser: 1.04 mg/dL (ref 0.40–1.50)
GFR: 70.12 mL/min (ref >60.00)
Glucose, Bld: 125 mg/dL — ABNORMAL HIGH (ref 70–99)
Phosphorus: 3.3 mg/dL (ref 2.3–4.6)
Potassium: 4.1 meq/L (ref 3.5–5.1)
Sodium: 143 meq/L (ref 135–145)

## 2024-05-05 LAB — LDL CHOLESTEROL, DIRECT: Direct LDL: 52 mg/dL

## 2024-05-05 MED ORDER — TRAZODONE HCL 50 MG PO TABS: 50.0000 mg | ORAL_TABLET | Freq: Every day | ORAL | 1 refills | Status: AC

## 2024-05-05 MED ORDER — METFORMIN HCL ER 750 MG PO TB24: 750.0000 mg | ORAL_TABLET | Freq: Every day | ORAL | 1 refills | Status: AC

## 2024-05-05 MED ORDER — EMPAGLIFLOZIN 25 MG PO TABS: 25.0000 mg | ORAL_TABLET | Freq: Every day | ORAL | 1 refills | Status: AC

## 2024-05-05 NOTE — Progress Notes (Signed)
 Established Patient Visit  Patient: Larry [name]   DOB: 07/31/1950   73 y.o. Male  MRN: [id number] Visit Date: 05/05/2024  Subjective:    Chief Complaint  Patient presents with   Follow-up    3 month follow up for HTN, DM and Hyperlipidemia    HPI  Accompanied by daughter.  Hypertension Bp at goal BP Readings from Last 3 Encounters:  05/05/24 138/70  01/28/24 134/78  01/28/24 (!) 147/76    Maintain med dose Encouraged to maintain DASH diet and use compression stocking for LE edema Repeat BMP F/up in 3months  Hyperlipidemia associated with type 2 diabetes mellitus (HCC) Repeat LDL Maintain atorvastatin  dose  DM (diabetes mellitus) (HCC) Reminded again about need for DIABETES eye exam. Repeat hgbA1c at 6.8% improved from 7.5% Admits to high sugar, high carb diet. Drinks only 16oz of water daily. Advised to schedule appointment with nutritionist. Abnormal UACr-current use of ARB, SGLT-2, and metformin .   Check BMP, and direct LDL Advised to maintain at least 60oz of water daily, low carb/low sugar diet/low salt diet. Maintain current med doses F/up in 3months  Wt Readings from Last 3 Encounters:  05/05/24 172 lb 12.8 oz (78.4 kg)  01/28/24 177 lb 6.4 oz (80.5 kg)  01/28/24 169 lb (76.7 kg)    BP Readings from Last 3 Encounters:  05/05/24 138/70  01/28/24 134/78  01/28/24 (!) 147/76    Reviewed medical, surgical, and social history today  Medications: Outpatient Medications Prior to Visit  Medication Sig   ACCU-CHEK AVIVA PLUS test strip TEST FASTING BLOOD SUGAR ONCE  DAILY IN THE MORNING   Accu-Chek FastClix Lancets MISC    amLODipine -olmesartan  (AZOR ) 10-40 MG tablet Take 1 tablet by mouth daily.   atorvastatin  (LIPITOR) 20 MG tablet TAKE 1 TABLET BY MOUTH DAILY   baclofen  (LIORESAL ) 20 MG tablet TAKE 1 TABLET BY MOUTH 3 TIMES  DAILY   Blood Glucose Calibration (ACCU-CHEK AVIVA) SOLN    ferrous sulfate  325 (65 FE) MG tablet Take 1 tablet  (325 mg total) by mouth daily with breakfast.   finasteride  (PROSCAR ) 5 MG tablet TAKE 1 TABLET BY MOUTH DAILY   Incontinence Supply Disposable (DEPEND ADJUSTABLE UNDERWEAR LG) MISC Use as needed.   Lancets Misc. (ACCU-CHEK FASTCLIX LANCET) KIT 1 Units by Does not apply route daily before breakfast.   Multiple Vitamin (MULTIVITAMIN) tablet Take 1 tablet by mouth daily.   pantoprazole  (PROTONIX ) 40 MG tablet Take 1 tablet (40 mg total) by mouth 2 (two) times daily before a meal.   pregabalin  (LYRICA ) 150 MG capsule TAKE 1 CAPSULE BY MOUTH TWICE  DAILY   Vibegron  (GEMTESA ) 75 MG TABS Take 1 tablet (75 mg total) by mouth daily.   [DISCONTINUED] empagliflozin  (JARDIANCE ) 25 MG TABS tablet TAKE 1 TABLET BY MOUTH DAILY   [DISCONTINUED] metFORMIN  (GLUCOPHAGE -XR) 750 MG 24 hr tablet TAKE 1 TABLET BY MOUTH DAILY  WITH BREAKFAST   [DISCONTINUED] traZODone  (DESYREL ) 50 MG tablet TAKE 1 TABLET BY MOUTH AT  BEDTIME   No facility-administered medications prior to visit.   Reviewed past medical and social history.   ROS per HPI above      Objective:  BP 138/70 (BP Location: Left Arm, Patient Position: Sitting, Cuff Size: Normal)   Pulse 78   Temp 97.6 F (36.4 C) (Oral)   Wt 172 lb 12.8 oz (78.4 kg)   SpO2 97%   BMI 27.06 kg/m  Physical Exam Vitals and nursing note reviewed.  Cardiovascular:     Rate and Rhythm: Normal rate and regular rhythm.     Pulses: Normal pulses.     Heart sounds: Normal heart sounds.  Pulmonary:     Effort: Pulmonary effort is normal.     Breath sounds: Normal breath sounds.  Musculoskeletal:     Right lower leg: Edema present.     Left lower leg: Edema present.  Neurological:     Mental Status: He is alert and oriented to person, place, and time.  Psychiatric:        Mood and Affect: Mood normal.        Behavior: Behavior normal.        Thought Content: Thought content normal.     Results for orders placed or performed in visit on 05/05/24  POCT  glycosylated hemoglobin (Hb A1C)  Result Value Ref Range   Hemoglobin A1C 6.8 (A) 4.0 - 5.6 %   HbA1c POC (<> result, manual entry)     HbA1c, POC (prediabetic range)     HbA1c, POC (controlled diabetic range)        Assessment & Plan:    Problem List Items Addressed This Visit     DM (diabetes mellitus) (HCC) - Primary   Reminded again about need for DIABETES eye exam. Repeat hgbA1c at 6.8% improved from 7.5% Admits to high sugar, high carb diet. Drinks only 16oz of water daily. Advised to schedule appointment with nutritionist. Abnormal UACr-current use of ARB, SGLT-2, and metformin .   Check BMP, and direct LDL Advised to maintain at least 60oz of water daily, low carb/low sugar diet/low salt diet. Maintain current med doses F/up in 3months      Relevant Medications   empagliflozin  (JARDIANCE ) 25 MG TABS tablet   metFORMIN  (GLUCOPHAGE -XR) 750 MG 24 hr tablet   Other Relevant Orders   Renal Function Panel   POCT glycosylated hemoglobin (Hb A1C) (Completed)   Hyperlipidemia associated with type 2 diabetes mellitus (HCC)   Repeat LDL Maintain atorvastatin  dose      Relevant Medications   empagliflozin  (JARDIANCE ) 25 MG TABS tablet   metFORMIN  (GLUCOPHAGE -XR) 750 MG 24 hr tablet   Other Relevant Orders   Direct LDL   Hypertension   Bp at goal BP Readings from Last 3 Encounters:  05/05/24 138/70  01/28/24 134/78  01/28/24 (!) 147/76    Maintain med dose Encouraged to maintain DASH diet and use compression stocking for LE edema Repeat BMP F/up in 3months      Microalbuminuria due to type 2 diabetes mellitus (HCC)   Relevant Medications   empagliflozin  (JARDIANCE ) 25 MG TABS tablet   metFORMIN  (GLUCOPHAGE -XR) 750 MG 24 hr tablet   Other Visit Diagnoses       Insomnia, unspecified type       Relevant Medications   traZODone  (DESYREL ) 50 MG tablet      Return in about 3 months (around 08/03/2024) for HTN, DM, hyperlipidemia (fasting).     Roselie Mood,  NP

## 2024-05-05 NOTE — Patient Instructions (Addendum)
 Schedule appointment with nutritionist: [phone]. Schedule appointment for DIABETES eye exam. Go to lab Maintain Heart healthy diet and daily exercise. Maintain current medications.

## 2024-05-05 NOTE — Telephone Encounter (Addendum)
 Left voice message asking patient to give me a call back at the office.   ----- Message from Roselie Mood sent at 05/05/2024  1:20 PM EST ----- Stable renal and liver function LDL at goal. F/up as discussed  ----- Message ----- From: Mood Roselie [place], NP Sent: 05/05/2024  10:56 AM EST To: Roselie [place] Mood, NP

## 2024-05-05 NOTE — Assessment & Plan Note (Signed)
 Reminded again about need for DIABETES eye exam. Repeat hgbA1c at 6.8% improved from 7.5% Admits to high sugar, high carb diet. Drinks only 16oz of water daily. Advised to schedule appointment with nutritionist. Abnormal UACr-current use of ARB, SGLT-2, and metformin .   Check BMP, and direct LDL Advised to maintain at least 60oz of water daily, low carb/low sugar diet/low salt diet. Maintain current med doses F/up in 3months

## 2024-05-05 NOTE — Assessment & Plan Note (Signed)
 Repeat LDL Maintain atorvastatin  dose

## 2024-05-05 NOTE — Assessment & Plan Note (Addendum)
 Bp at goal BP Readings from Last 3 Encounters:  05/05/24 138/70  01/28/24 134/78  01/28/24 (!) 147/76    Maintain med dose Encouraged to maintain DASH diet and use compression stocking for LE edema Repeat BMP F/up in 3months

## 2024-05-12 ENCOUNTER — Other Ambulatory Visit: Payer: Self-pay | Admitting: Urology

## 2024-05-12 ENCOUNTER — Other Ambulatory Visit: Payer: Self-pay | Admitting: Nurse Practitioner

## 2024-05-12 DIAGNOSIS — N3942 Incontinence without sensory awareness: Secondary | ICD-10-CM

## 2024-05-12 DIAGNOSIS — G8220 Paraplegia, unspecified: Secondary | ICD-10-CM

## 2024-05-12 DIAGNOSIS — M4802 Spinal stenosis, cervical region: Secondary | ICD-10-CM

## 2024-05-12 DIAGNOSIS — M48062 Spinal stenosis, lumbar region with neurogenic claudication: Secondary | ICD-10-CM

## 2024-06-10 ENCOUNTER — Other Ambulatory Visit: Payer: Self-pay | Admitting: Nurse Practitioner

## 2024-06-16 ENCOUNTER — Ambulatory Visit: Payer: Medicare Other

## 2024-06-20 ENCOUNTER — Ambulatory Visit

## 2024-06-20 DIAGNOSIS — Z0000 Encounter for general adult medical examination without abnormal findings: Secondary | ICD-10-CM | POA: Diagnosis not present

## 2024-06-20 NOTE — Patient Instructions (Signed)
 [name],  Thank you for taking the time for your Medicare Wellness Visit. I appreciate your continued commitment to your health goals. Please review the care plan we discussed, and feel free to reach out if I can assist you further.  Please note that Annual Wellness Visits do not include a physical exam. Some assessments may be limited, especially if the visit was conducted virtually. If needed, we may recommend an in-person follow-up with your provider.  Ongoing Care Seeing your primary care provider every 3 to 6 months helps us  monitor your health and provide consistent, personalized care.  Referrals If a referral was made during today's visit and you haven't received any updates within two weeks, please contact the referred provider directly to check on the status.  Recommended Screenings:  Health Maintenance  Topic Date Due   Eye exam for diabetics  12/10/2020   COVID-19 Vaccine (3 - 2025-26 season) 01/31/2024   Medicare Annual Wellness Visit  06/13/2024   Zoster (Shingles) Vaccine (1 of 2) 08/03/2024*   Flu Shot  08/29/2024*   Complete foot exam   06/24/2024   Cologuard (Stool DNA test)  07/25/2024   Kidney health urinalysis for diabetes  07/29/2024   Hemoglobin A1C  11/03/2024   Yearly kidney function blood test for diabetes  05/05/2025   Pneumococcal Vaccine for age over 32  Completed   Hepatitis C Screening  Completed   Meningitis B Vaccine  Aged Out   DTaP/Tdap/Td vaccine  Discontinued   Colon Cancer Screening  Discontinued  *Topic was postponed. The date shown is not the original due date.       06/20/2024    1:13 PM  Advanced Directives  Does Patient Have a Medical Advance Directive? Yes  Type of Estate Agent of [place];Living will  Does patient want to make changes to medical advance directive? No - Patient declined  Copy of Healthcare Power of Attorney in Chart? No - copy requested    Vision: Annual vision screenings are recommended  for early detection of glaucoma, cataracts, and diabetic retinopathy. These exams can also reveal signs of chronic conditions such as diabetes and high blood pressure.  Dental: Annual dental screenings help detect early signs of oral cancer, gum disease, and other conditions linked to overall health, including heart disease and diabetes.  Please see the attached documents for additional preventive care recommendations.

## 2024-06-20 NOTE — Progress Notes (Signed)
 "  Chief Complaint  Patient presents with   Medicare Wellness     Subjective:   Larry [name] is a 74 y.o. male who presents for a Medicare Annual Wellness Visit.  Visit info / Clinical Intake: Medicare Wellness Visit Type:: Subsequent Annual Wellness Visit Persons participating in visit and providing information:: patient Medicare Wellness Visit Mode:: Video Since this visit was completed virtually, some vitals may be partially provided or unavailable. Missing vitals are due to the limitations of the virtual format.: Unable to obtain vitals - no equipment If Telephone or Video please confirm:: I connected with patient using audio/video enable telemedicine. I verified patient identity with two identifiers, discussed telehealth limitations, and patient agreed to proceed. Patient Location:: home Provider Location:: office Interpreter Needed?: No Pre-visit prep was completed: yes AWV questionnaire completed by patient prior to visit?: no Living arrangements:: (!) lives alone Patient's Overall Health Status Rating: good Typical amount of pain: none Does pain affect daily life?: no Are you currently prescribed opioids?: no  Dietary Habits and Nutritional Risks How many meals a day?: 2 Eats fruit and vegetables daily?: yes Most meals are obtained by: having others provide food In the last 2 weeks, have you had any of the following?: none Diabetic:: (!) yes Any non-healing wounds?: no How often do you check your BS?: as needed Would you like to be referred to a Nutritionist or for Diabetic Management? : no  Functional Status Activities of Daily Living (to include ambulation/medication): Independent Ambulation: Independent with device- listed below Home Assistive Devices/Equipment: Wheelchair Medication Administration: Independent Home Management (perform basic housework or laundry): Needs assistance (comment) Manage your own finances?: yes Primary transportation is: family /  friends; facility / other Concerns about vision?: no *vision screening is required for WTM* Concerns about hearing?: no  Fall Screening Falls in the past year?: 1 (transferring to wheelchair) Number of falls in past year: 0 Was there an injury with Fall?: 0 Fall Risk Category Calculator: 1 Patient Fall Risk Level: Low Fall Risk  Fall Risk Patient at Risk for Falls Due to: Impaired mobility; Medication side effect; Impaired balance/gait Fall risk Follow up: Falls evaluation completed; Education provided; Falls prevention discussed  Home and Transportation Safety: All rugs have non-skid backing?: N/A, no rugs All stairs or steps have railings?: N/A, no stairs Grab bars in the bathtub or shower?: yes Have non-skid surface in bathtub or shower?: yes Good home lighting?: yes Regular seat belt use?: yes Hospital stays in the last year:: no  Cognitive Assessment Difficulty concentrating, remembering, or making decisions? : no Will 6CIT or Mini Cog be Completed: yes What year is it?: 0 points What month is it?: 0 points Give patient an address phrase to remember (5 components): [address] [place] About what time is it?: 0 points Count backwards from 20 to 1: 0 points Say the months of the year in reverse: 2 points Repeat the address phrase from earlier: 0 points 6 CIT Score: 2 points  Advance Directives (For Healthcare) Does Patient Have a Medical Advance Directive?: Yes Does patient want to make changes to medical advance directive?: No - Patient declined Type of Advance Directive: Healthcare Power of [place]; Living will Copy of Healthcare Power of Attorney in Chart?: No - copy requested Copy of Living Will in Chart?: No - copy requested  Reviewed/Updated  Reviewed/Updated: Reviewed All (Medical, Surgical, Family, Medications, Allergies, Care Teams, Patient Goals)    Allergies (verified) Nsaids and Rosuvastatin    Current Medications (verified) Outpatient Encounter  Medications as of 06/20/2024  Medication Sig   ACCU-CHEK AVIVA PLUS test strip TEST FASTING BLOOD SUGAR ONCE  DAILY IN THE MORNING   Accu-Chek FastClix Lancets MISC    amLODipine -olmesartan  (AZOR ) 10-40 MG tablet Take 1 tablet by mouth daily.   atorvastatin  (LIPITOR) 20 MG tablet TAKE 1 TABLET BY MOUTH DAILY   baclofen  (LIORESAL ) 20 MG tablet TAKE 1 TABLET BY MOUTH 3 TIMES  DAILY   Blood Glucose Calibration (ACCU-CHEK AVIVA) SOLN    cyanocobalamin (VITAMIN B12) 1000 MCG tablet Take 1,000 mcg by mouth daily.   empagliflozin  (JARDIANCE ) 25 MG TABS tablet Take 1 tablet (25 mg total) by mouth daily.   finasteride  (PROSCAR ) 5 MG tablet TAKE 1 TABLET BY MOUTH DAILY   GEMTESA  75 MG TABS TAKE 1 TABLET BY MOUTH DAILY   Incontinence Supply Disposable (DEPEND ADJUSTABLE UNDERWEAR LG) MISC Use as needed.   Lancets Misc. (ACCU-CHEK FASTCLIX LANCET) KIT 1 Units by Does not apply route daily before breakfast.   metFORMIN  (GLUCOPHAGE -XR) 750 MG 24 hr tablet Take 1 tablet (750 mg total) by mouth daily with breakfast.   Multiple Vitamin (MULTIVITAMIN) tablet Take 1 tablet by mouth daily.   pantoprazole  (PROTONIX ) 40 MG tablet Take 1 tablet (40 mg total) by mouth 2 (two) times daily before a meal.   pregabalin  (LYRICA ) 150 MG capsule TAKE 1 CAPSULE BY MOUTH TWICE  DAILY   traZODone  (DESYREL ) 50 MG tablet Take 1 tablet (50 mg total) by mouth at bedtime.   ferrous sulfate  325 (65 FE) MG tablet Take 1 tablet (325 mg total) by mouth daily with breakfast.   No facility-administered encounter medications on file as of 06/20/2024.    History: Past Medical History:  Diagnosis Date   ABLA (acute blood loss anemia) 02/15/2022   Abnormality of gait    Acute delirium 12/11/2011   Acute encephalopathy 02/15/2022   Acute respiratory failure due to COVID-19 [facility]) 09/23/2019   Cognitive communication deficit    CTS (carpal tunnel syndrome) 09/26/2015   Diabetes mellitus    GI bleed 02/15/2022   Hypercholesterolemia     Hypertension    Kidney stones    Leukocytopenia 05/22/2013   MS (multiple sclerosis)    sees [facility]   T10 spinal cord injury (HCC) 12/12/2011   hx surgery 1996 with bilat gait abnormality since   Tachycardia    Past Surgical History:  Procedure Laterality Date   BACK SURGERY     BIOPSY  02/16/2022   Procedure: BIOPSY;  Surgeon: Eda Iha, MD;  Location: [facility] ENDOSCOPY;  Service: Gastroenterology;;   COLONOSCOPY     ESOPHAGOGASTRODUODENOSCOPY (EGD) WITH PROPOFOL  N/A 02/16/2022   Procedure: ESOPHAGOGASTRODUODENOSCOPY (EGD) WITH PROPOFOL ;  Surgeon: Eda Iha, MD;  Location: [facility] ENDOSCOPY;  Service: Gastroenterology;  Laterality: N/A;   LUMBAR LAMINECTOMY     Family History  Problem Relation Age of Onset   Hypertension Mother    Liver cancer Father    Hypertension Sister    Social History   Occupational History   Occupation: Disability  Tobacco Use   Smoking status: Never   Smokeless tobacco: Never  Vaping Use   Vaping status: Never Used  Substance and Sexual Activity   Alcohol use: No   Drug use: No   Sexual activity: Not Currently   Tobacco Counseling Counseling given: Not Answered  SDOH Screenings   Food Insecurity: No Food Insecurity (06/20/2024)  Housing: Unknown (06/20/2024)  Transportation Needs: No Transportation Needs (06/20/2024)  Utilities: Not At Risk (06/20/2024)  Alcohol  Screen: Low Risk (06/20/2024)  Depression (PHQ2-9): Low Risk (06/20/2024)  Financial Resource Strain: Low Risk (06/20/2024)  Physical Activity: Insufficiently Active (06/20/2024)  Social Connections: Moderately Isolated (06/20/2024)  Stress: No Stress Concern Present (06/20/2024)  Tobacco Use: Low Risk (06/20/2024)  Health Literacy: Adequate Health Literacy (06/20/2024)   See flowsheets for full screening details  Depression Screen PHQ 2 & 9 Depression Scale- Over the past 2 weeks, how often have you been bothered by any of the following problems? Little interest or  pleasure in doing things: 0 Feeling down, depressed, or hopeless (PHQ Adolescent also includes...irritable): 0 PHQ-2 Total Score: 0 Trouble falling or staying asleep, or sleeping too much: 3 (up going to the bathroom) Feeling tired or having little energy: 0 Poor appetite or overeating (PHQ Adolescent also includes...weight loss): 0 Feeling bad about yourself - or that you are a failure or have let yourself or your family down: 0 Trouble concentrating on things, such as reading the newspaper or watching television (PHQ Adolescent also includes...like school work): 0 Moving or speaking so slowly that other people could have noticed. Or the opposite - being so fidgety or restless that you have been moving around a lot more than usual: 0 Thoughts that you would be better off dead, or of hurting yourself in some way: 0 PHQ-9 Total Score: 3 If you checked off any problems, how difficult have these problems made it for you to do your work, take care of things at home, or get along with other people?: Not difficult at all  Depression Treatment Depression Interventions/Treatment : [id number] Score <4 Follow-up Not Indicated     Goals Addressed               This Visit's Progress     Wants to do more exercise (pt-stated)               Objective:    Today's Vitals   There is no height or weight on file to calculate BMI.  Hearing/Vision screen Vision Screening - Comments:: No regular eye exams Immunizations and Health Maintenance Health Maintenance  Topic Date Due   OPHTHALMOLOGY EXAM  12/10/2020   COVID-19 Vaccine (3 - 2025-26 season) 01/31/2024   Zoster Vaccines- Shingrix (1 of 2) 08/03/2024 (Originally 08/08/1998)   Influenza Vaccine  08/29/2024 (Originally 12/31/2023)   FOOT EXAM  06/24/2024   Fecal DNA (Cologuard)  07/25/2024   Diabetic kidney evaluation - Urine ACR  07/29/2024   HEMOGLOBIN A1C  11/03/2024   Diabetic kidney evaluation - eGFR measurement  05/05/2025   Medicare  Annual Wellness (AWV)  06/20/2025   Pneumococcal Vaccine: 50+ Years  Completed   Hepatitis C Screening  Completed   Meningococcal B Vaccine  Aged Out   DTaP/Tdap/Td  Discontinued   Colonoscopy  Discontinued        Assessment/Plan:  This is a routine wellness examination for Giavonni.  Patient Care Team: Nche, Roselie Rockford, NP as PCP - General (Internal Medicine) Roseann, Adine PARAS., MD as Referring Physician (Urology)  I have personally reviewed and noted the following in the patients chart:   Medical and social history Use of alcohol, tobacco or illicit drugs  Current medications and supplements including opioid prescriptions. Functional ability and status Nutritional status Physical activity Advanced directives List of other physicians Hospitalizations, surgeries, and ER visits in previous 12 months Vitals Screenings to include cognitive, depression, and falls Referrals and appointments  No orders of the defined types were placed in this encounter.  In addition,  I have reviewed and discussed with patient certain preventive protocols, quality metrics, and best practice recommendations. A written personalized care plan for preventive services as well as general preventive health recommendations were provided to patient.   Ardella FORBES Dawn, LPN   [id number]/79/[id number]   Return in 1 year (on 06/20/2025).  After Visit Summary: (MyChart) Due to this being a telephonic visit, the after visit summary with patients personalized plan was offered to patient via MyChart   Nurse Notes: Vaccines not given: covid declined today HM Addressed: Due for an eye exam. Would not make appointment for next year at this time. Needs to check with daughter since she drives him.  "

## 2024-07-28 ENCOUNTER — Ambulatory Visit: Admitting: Urology

## 2024-08-04 ENCOUNTER — Ambulatory Visit: Admitting: Nurse Practitioner
